# Patient Record
Sex: Female | Born: 1970 | Race: White | Hispanic: No | Smoking: Current every day smoker
Health system: Southern US, Community
[De-identification: ages and names within clinical notes are randomized; demographics above are authoritative.]

## PROBLEM LIST (undated history)

## (undated) DIAGNOSIS — J449 Chronic obstructive pulmonary disease, unspecified: Secondary | ICD-10-CM

## (undated) DIAGNOSIS — Z87442 Personal history of urinary calculi: Secondary | ICD-10-CM

## (undated) DIAGNOSIS — G8929 Other chronic pain: Secondary | ICD-10-CM

## (undated) DIAGNOSIS — I368 Other nonrheumatic tricuspid valve disorders: Secondary | ICD-10-CM

## (undated) DIAGNOSIS — F419 Anxiety disorder, unspecified: Secondary | ICD-10-CM

## (undated) DIAGNOSIS — N809 Endometriosis, unspecified: Secondary | ICD-10-CM

## (undated) DIAGNOSIS — M25569 Pain in unspecified knee: Secondary | ICD-10-CM

## (undated) DIAGNOSIS — F1910 Other psychoactive substance abuse, uncomplicated: Secondary | ICD-10-CM

## (undated) DIAGNOSIS — B1920 Unspecified viral hepatitis C without hepatic coma: Secondary | ICD-10-CM

## (undated) DIAGNOSIS — IMO0002 Reserved for concepts with insufficient information to code with codable children: Secondary | ICD-10-CM

## (undated) DIAGNOSIS — M009 Pyogenic arthritis, unspecified: Secondary | ICD-10-CM

## (undated) DIAGNOSIS — I2699 Other pulmonary embolism without acute cor pulmonale: Secondary | ICD-10-CM

## (undated) DIAGNOSIS — K219 Gastro-esophageal reflux disease without esophagitis: Secondary | ICD-10-CM

## (undated) DIAGNOSIS — M549 Dorsalgia, unspecified: Secondary | ICD-10-CM

## (undated) DIAGNOSIS — N200 Calculus of kidney: Secondary | ICD-10-CM

## (undated) DIAGNOSIS — T1490XA Injury, unspecified, initial encounter: Secondary | ICD-10-CM

## (undated) DIAGNOSIS — F410 Panic disorder [episodic paroxysmal anxiety] without agoraphobia: Secondary | ICD-10-CM

## (undated) HISTORY — PX: NOSE SURGERY: SHX723

## (undated) HISTORY — DX: Chronic obstructive pulmonary disease, unspecified: J44.9

## (undated) HISTORY — DX: Anxiety disorder, unspecified: F41.9

## (undated) HISTORY — PX: TUBAL LIGATION: SHX77

## (undated) HISTORY — DX: Pyogenic arthritis, unspecified: M00.9

## (undated) HISTORY — DX: Endometriosis, unspecified: N80.9

## (undated) HISTORY — DX: Injury, unspecified, initial encounter: T14.90XA

## (undated) HISTORY — DX: Panic disorder (episodic paroxysmal anxiety): F41.0

## (undated) HISTORY — DX: Other nonrheumatic tricuspid valve disorders: I36.8

---

## 1997-05-23 ENCOUNTER — Inpatient Hospital Stay (HOSPITAL_COMMUNITY): Admission: AD | Admit: 1997-05-23 | Discharge: 1997-05-25 | Payer: Self-pay | Admitting: Family Medicine

## 1997-05-27 ENCOUNTER — Inpatient Hospital Stay (HOSPITAL_COMMUNITY): Admission: AD | Admit: 1997-05-27 | Discharge: 1997-05-30 | Payer: Self-pay | Admitting: Family Medicine

## 2000-11-04 ENCOUNTER — Emergency Department (HOSPITAL_COMMUNITY): Admission: EM | Admit: 2000-11-04 | Discharge: 2000-11-04 | Payer: Self-pay | Admitting: *Deleted

## 2000-11-04 ENCOUNTER — Emergency Department (HOSPITAL_COMMUNITY): Admission: EM | Admit: 2000-11-04 | Discharge: 2000-11-04 | Payer: Self-pay | Admitting: Emergency Medicine

## 2000-11-14 ENCOUNTER — Emergency Department (HOSPITAL_COMMUNITY): Admission: EM | Admit: 2000-11-14 | Discharge: 2000-11-14 | Payer: Self-pay | Admitting: Emergency Medicine

## 2001-02-28 ENCOUNTER — Encounter: Payer: Self-pay | Admitting: Emergency Medicine

## 2001-02-28 ENCOUNTER — Emergency Department (HOSPITAL_COMMUNITY): Admission: EM | Admit: 2001-02-28 | Discharge: 2001-02-28 | Payer: Self-pay | Admitting: Emergency Medicine

## 2001-09-05 ENCOUNTER — Other Ambulatory Visit: Admission: RE | Admit: 2001-09-05 | Discharge: 2001-09-05 | Payer: Self-pay

## 2001-11-20 ENCOUNTER — Emergency Department (HOSPITAL_COMMUNITY): Admission: EM | Admit: 2001-11-20 | Discharge: 2001-11-20 | Payer: Self-pay | Admitting: Emergency Medicine

## 2003-12-21 ENCOUNTER — Emergency Department (HOSPITAL_COMMUNITY): Admission: EM | Admit: 2003-12-21 | Discharge: 2003-12-22 | Payer: Self-pay | Admitting: *Deleted

## 2005-08-05 ENCOUNTER — Emergency Department (HOSPITAL_COMMUNITY): Admission: EM | Admit: 2005-08-05 | Discharge: 2005-08-05 | Payer: Self-pay | Admitting: Family Medicine

## 2007-06-06 ENCOUNTER — Emergency Department (HOSPITAL_COMMUNITY): Admission: EM | Admit: 2007-06-06 | Discharge: 2007-06-06 | Payer: Self-pay | Admitting: Emergency Medicine

## 2007-07-17 ENCOUNTER — Emergency Department (HOSPITAL_COMMUNITY): Admission: EM | Admit: 2007-07-17 | Discharge: 2007-07-17 | Payer: Self-pay | Admitting: Emergency Medicine

## 2007-10-27 ENCOUNTER — Emergency Department (HOSPITAL_COMMUNITY): Admission: EM | Admit: 2007-10-27 | Discharge: 2007-10-27 | Payer: Self-pay | Admitting: *Deleted

## 2008-06-06 ENCOUNTER — Emergency Department (HOSPITAL_COMMUNITY): Admission: EM | Admit: 2008-06-06 | Discharge: 2008-06-06 | Payer: Self-pay | Admitting: Emergency Medicine

## 2008-08-12 ENCOUNTER — Emergency Department (HOSPITAL_COMMUNITY): Admission: EM | Admit: 2008-08-12 | Discharge: 2008-08-12 | Payer: Self-pay | Admitting: Emergency Medicine

## 2008-12-08 ENCOUNTER — Emergency Department (HOSPITAL_COMMUNITY): Admission: EM | Admit: 2008-12-08 | Discharge: 2008-12-08 | Payer: Self-pay | Admitting: Emergency Medicine

## 2008-12-10 ENCOUNTER — Emergency Department (HOSPITAL_COMMUNITY): Admission: EM | Admit: 2008-12-10 | Discharge: 2008-12-10 | Payer: Self-pay | Admitting: Emergency Medicine

## 2009-01-18 ENCOUNTER — Emergency Department (HOSPITAL_COMMUNITY): Admission: EM | Admit: 2009-01-18 | Discharge: 2009-01-18 | Payer: Self-pay | Admitting: Emergency Medicine

## 2009-05-17 ENCOUNTER — Emergency Department (HOSPITAL_COMMUNITY): Admission: EM | Admit: 2009-05-17 | Discharge: 2009-05-17 | Payer: Self-pay | Admitting: Emergency Medicine

## 2009-10-21 ENCOUNTER — Emergency Department (HOSPITAL_COMMUNITY): Admission: EM | Admit: 2009-10-21 | Discharge: 2009-10-21 | Payer: Self-pay | Admitting: Emergency Medicine

## 2009-11-13 ENCOUNTER — Emergency Department (HOSPITAL_COMMUNITY): Admission: EM | Admit: 2009-11-13 | Discharge: 2009-11-13 | Payer: Self-pay | Admitting: Emergency Medicine

## 2009-12-23 ENCOUNTER — Emergency Department (HOSPITAL_COMMUNITY): Admission: EM | Admit: 2009-12-23 | Discharge: 2009-12-23 | Payer: Self-pay | Admitting: Emergency Medicine

## 2009-12-26 ENCOUNTER — Emergency Department (HOSPITAL_COMMUNITY): Admission: EM | Admit: 2009-12-26 | Discharge: 2009-12-26 | Payer: Self-pay | Admitting: Emergency Medicine

## 2010-03-02 ENCOUNTER — Other Ambulatory Visit: Admission: RE | Admit: 2010-03-02 | Discharge: 2010-03-02 | Payer: Self-pay | Admitting: Obstetrics & Gynecology

## 2010-03-31 ENCOUNTER — Emergency Department (HOSPITAL_COMMUNITY)
Admission: EM | Admit: 2010-03-31 | Discharge: 2010-03-31 | Payer: Self-pay | Source: Home / Self Care | Admitting: Emergency Medicine

## 2010-06-16 ENCOUNTER — Emergency Department (HOSPITAL_COMMUNITY): Payer: Medicare Other

## 2010-06-16 ENCOUNTER — Emergency Department (HOSPITAL_COMMUNITY)
Admission: EM | Admit: 2010-06-16 | Discharge: 2010-06-17 | Disposition: A | Discharge status: Home / Self Care | Payer: Medicare Other | Attending: Emergency Medicine | Admitting: Emergency Medicine

## 2010-06-16 DIAGNOSIS — J209 Acute bronchitis, unspecified: Secondary | ICD-10-CM | POA: Insufficient documentation

## 2010-06-16 DIAGNOSIS — R509 Fever, unspecified: Secondary | ICD-10-CM | POA: Insufficient documentation

## 2010-06-16 DIAGNOSIS — R059 Cough, unspecified: Secondary | ICD-10-CM | POA: Insufficient documentation

## 2010-06-16 DIAGNOSIS — R05 Cough: Secondary | ICD-10-CM | POA: Insufficient documentation

## 2010-06-16 DIAGNOSIS — M549 Dorsalgia, unspecified: Secondary | ICD-10-CM | POA: Insufficient documentation

## 2010-06-25 LAB — WET PREP, GENITAL
Trich, Wet Prep: NONE SEEN
Yeast Wet Prep HPF POC: NONE SEEN

## 2010-06-25 LAB — RPR: RPR Ser Ql: NONREACTIVE

## 2010-06-25 LAB — URINALYSIS, ROUTINE W REFLEX MICROSCOPIC
Bilirubin Urine: NEGATIVE
Bilirubin Urine: NEGATIVE
Glucose, UA: NEGATIVE mg/dL
Glucose, UA: NEGATIVE mg/dL
Hgb urine dipstick: NEGATIVE
Hgb urine dipstick: NEGATIVE
Ketones, ur: NEGATIVE mg/dL
Ketones, ur: NEGATIVE mg/dL
Nitrite: NEGATIVE
Nitrite: NEGATIVE
Protein, ur: NEGATIVE mg/dL
Protein, ur: NEGATIVE mg/dL
Specific Gravity, Urine: 1.025 (ref 1.005–1.030)
Specific Gravity, Urine: 1.02 (ref 1.005–1.030)
Urobilinogen, UA: 0.2 mg/dL (ref 0.0–1.0)
Urobilinogen, UA: 0.2 mg/dL (ref 0.0–1.0)
pH: 6 (ref 5.0–8.0)
pH: 5.5 (ref 5.0–8.0)

## 2010-06-25 LAB — POCT PREGNANCY, URINE: Preg Test, Ur: NEGATIVE

## 2010-06-25 LAB — URINE MICROSCOPIC-ADD ON

## 2010-06-25 LAB — GC/CHLAMYDIA PROBE AMP, GENITAL
Chlamydia, DNA Probe: NEGATIVE
GC Probe Amp, Genital: NEGATIVE

## 2010-06-26 LAB — RAPID URINE DRUG SCREEN, HOSP PERFORMED
Amphetamines: NOT DETECTED
Barbiturates: NOT DETECTED
Benzodiazepines: POSITIVE — AB
Cocaine: POSITIVE — AB
Opiates: NOT DETECTED
Tetrahydrocannabinol: NOT DETECTED

## 2010-06-26 LAB — GLUCOSE, CAPILLARY: Glucose-Capillary: 107 mg/dL — ABNORMAL HIGH (ref 70–99)

## 2010-06-28 LAB — URINALYSIS, ROUTINE W REFLEX MICROSCOPIC
Bilirubin Urine: NEGATIVE
Glucose, UA: NEGATIVE mg/dL
Hgb urine dipstick: NEGATIVE
Ketones, ur: NEGATIVE mg/dL
Nitrite: NEGATIVE
Protein, ur: NEGATIVE mg/dL
Specific Gravity, Urine: 1.03 (ref 1.005–1.030)
Urobilinogen, UA: 0.2 mg/dL (ref 0.0–1.0)
pH: 5.5 (ref 5.0–8.0)

## 2010-06-28 LAB — DIFFERENTIAL
Basophils Absolute: 0 10*3/uL (ref 0.0–0.1)
Basophils Relative: 0 % (ref 0–1)
Eosinophils Absolute: 0.2 10*3/uL (ref 0.0–0.7)
Eosinophils Relative: 2 % (ref 0–5)
Lymphocytes Relative: 29 % (ref 12–46)
Lymphs Abs: 2.4 10*3/uL (ref 0.7–4.0)
Monocytes Absolute: 0.6 10*3/uL (ref 0.1–1.0)
Monocytes Relative: 7 % (ref 3–12)
Neutro Abs: 5 10*3/uL (ref 1.7–7.7)
Neutrophils Relative %: 61 % (ref 43–77)

## 2010-06-28 LAB — BASIC METABOLIC PANEL
BUN: 13 mg/dL (ref 6–23)
CO2: 20 mEq/L (ref 19–32)
Calcium: 8.8 mg/dL (ref 8.4–10.5)
Chloride: 106 mEq/L (ref 96–112)
Creatinine, Ser: 0.84 mg/dL (ref 0.4–1.2)
GFR calc Af Amer: >60 mL/min (ref >60)
GFR calc non Af Amer: >60 mL/min (ref >60)
Glucose, Bld: 119 mg/dL — ABNORMAL HIGH (ref 70–99)
Potassium: 3.5 mEq/L (ref 3.5–5.1)
Sodium: 134 mEq/L — ABNORMAL LOW (ref 135–145)

## 2010-06-28 LAB — WET PREP, GENITAL
Trich, Wet Prep: NONE SEEN
WBC, Wet Prep HPF POC: NONE SEEN
Yeast Wet Prep HPF POC: NONE SEEN

## 2010-06-28 LAB — CBC
HCT: 37.7 % (ref 36.0–46.0)
Hemoglobin: 13.1 g/dL (ref 12.0–15.0)
MCH: 30.8 pg (ref 26.0–34.0)
MCHC: 34.9 g/dL (ref 30.0–36.0)
MCV: 88.3 fL (ref 78.0–100.0)
Platelets: 260 10*3/uL (ref 150–400)
RBC: 4.27 MIL/uL (ref 3.87–5.11)
RDW: 14.6 % (ref 11.5–15.5)
WBC: 8.3 10*3/uL (ref 4.0–10.5)

## 2010-06-28 LAB — GC/CHLAMYDIA PROBE AMP, GENITAL
Chlamydia, DNA Probe: NEGATIVE
GC Probe Amp, Genital: NEGATIVE

## 2010-06-28 LAB — POCT PREGNANCY, URINE: Preg Test, Ur: NEGATIVE

## 2010-07-08 ENCOUNTER — Emergency Department (HOSPITAL_COMMUNITY)
Admission: EM | Admit: 2010-07-08 | Discharge: 2010-07-08 | Disposition: A | Discharge status: Home / Self Care | Payer: Medicare Other | Attending: Emergency Medicine | Admitting: Emergency Medicine

## 2010-07-08 ENCOUNTER — Emergency Department (HOSPITAL_COMMUNITY): Payer: Medicare Other

## 2010-07-08 DIAGNOSIS — S9780XA Crushing injury of unspecified foot, initial encounter: Secondary | ICD-10-CM | POA: Insufficient documentation

## 2010-07-08 DIAGNOSIS — S92253A Displaced fracture of navicular [scaphoid] of unspecified foot, initial encounter for closed fracture: Secondary | ICD-10-CM | POA: Insufficient documentation

## 2010-07-08 DIAGNOSIS — Y92009 Unspecified place in unspecified non-institutional (private) residence as the place of occurrence of the external cause: Secondary | ICD-10-CM | POA: Insufficient documentation

## 2010-07-13 ENCOUNTER — Emergency Department (HOSPITAL_COMMUNITY)
Admission: EM | Admit: 2010-07-13 | Discharge: 2010-07-13 | Disposition: A | Discharge status: Home / Self Care | Payer: Medicare Other | Attending: Emergency Medicine | Admitting: Emergency Medicine

## 2010-07-13 DIAGNOSIS — F319 Bipolar disorder, unspecified: Secondary | ICD-10-CM | POA: Insufficient documentation

## 2010-07-13 DIAGNOSIS — Z79899 Other long term (current) drug therapy: Secondary | ICD-10-CM | POA: Insufficient documentation

## 2010-07-13 DIAGNOSIS — M79609 Pain in unspecified limb: Secondary | ICD-10-CM | POA: Insufficient documentation

## 2010-07-13 DIAGNOSIS — Z4789 Encounter for other orthopedic aftercare: Secondary | ICD-10-CM | POA: Insufficient documentation

## 2010-07-16 LAB — DIFFERENTIAL
Basophils Absolute: 0 10*3/uL (ref 0.0–0.1)
Basophils Relative: 0 % (ref 0–1)
Eosinophils Absolute: 0.2 10*3/uL (ref 0.0–0.7)
Eosinophils Relative: 2 % (ref 0–5)
Lymphocytes Relative: 25 % (ref 12–46)
Lymphs Abs: 1.8 10*3/uL (ref 0.7–4.0)
Monocytes Absolute: 0.5 10*3/uL (ref 0.1–1.0)
Monocytes Relative: 7 % (ref 3–12)
Neutro Abs: 4.8 10*3/uL (ref 1.7–7.7)
Neutrophils Relative %: 66 % (ref 43–77)

## 2010-07-16 LAB — URINALYSIS, ROUTINE W REFLEX MICROSCOPIC
Bilirubin Urine: NEGATIVE
Glucose, UA: NEGATIVE mg/dL
Hgb urine dipstick: NEGATIVE
Ketones, ur: NEGATIVE mg/dL
Nitrite: NEGATIVE
Protein, ur: NEGATIVE mg/dL
Specific Gravity, Urine: 1.01 (ref 1.005–1.030)
Urobilinogen, UA: 0.2 mg/dL (ref 0.0–1.0)
pH: 8 (ref 5.0–8.0)

## 2010-07-16 LAB — RAPID URINE DRUG SCREEN, HOSP PERFORMED
Amphetamines: NOT DETECTED
Barbiturates: NOT DETECTED
Benzodiazepines: POSITIVE — AB
Cocaine: NOT DETECTED
Opiates: POSITIVE — AB
Tetrahydrocannabinol: NOT DETECTED

## 2010-07-16 LAB — COMPREHENSIVE METABOLIC PANEL
ALT: 37 U/L — ABNORMAL HIGH (ref 0–35)
AST: 37 U/L (ref 0–37)
Albumin: 3 g/dL — ABNORMAL LOW (ref 3.5–5.2)
Alkaline Phosphatase: 50 U/L (ref 39–117)
BUN: 9 mg/dL (ref 6–23)
CO2: 29 mEq/L (ref 19–32)
Calcium: 8.4 mg/dL (ref 8.4–10.5)
Chloride: 106 mEq/L (ref 96–112)
Creatinine, Ser: 0.64 mg/dL (ref 0.4–1.2)
GFR calc Af Amer: >60 mL/min (ref >60)
GFR calc non Af Amer: >60 mL/min (ref >60)
Glucose, Bld: 113 mg/dL — ABNORMAL HIGH (ref 70–99)
Potassium: 3.5 mEq/L (ref 3.5–5.1)
Sodium: 138 mEq/L (ref 135–145)
Total Bilirubin: 0.2 mg/dL — ABNORMAL LOW (ref 0.3–1.2)
Total Protein: 6.2 g/dL (ref 6.0–8.3)

## 2010-07-16 LAB — CBC
HCT: 33.5 % — ABNORMAL LOW (ref 36.0–46.0)
Hemoglobin: 11.6 g/dL — ABNORMAL LOW (ref 12.0–15.0)
MCHC: 34.8 g/dL (ref 30.0–36.0)
MCV: 90.4 fL (ref 78.0–100.0)
Platelets: 213 10*3/uL (ref 150–400)
RBC: 3.71 MIL/uL — ABNORMAL LOW (ref 3.87–5.11)
RDW: 14.9 % (ref 11.5–15.5)
WBC: 7.4 10*3/uL (ref 4.0–10.5)

## 2010-07-16 LAB — ETHANOL: Alcohol, Ethyl (B): <5 mg/dL (ref 0–10)

## 2010-07-21 ENCOUNTER — Encounter: Payer: Self-pay | Admitting: Orthopedic Surgery

## 2010-07-21 ENCOUNTER — Ambulatory Visit (INDEPENDENT_AMBULATORY_CARE_PROVIDER_SITE_OTHER): Payer: Medicare Other | Admitting: Orthopedic Surgery

## 2010-07-21 VITALS — HR 80 | Resp 16 | Ht 61.5 in | Wt 240.0 lb

## 2010-07-21 DIAGNOSIS — S9030XA Contusion of unspecified foot, initial encounter: Secondary | ICD-10-CM

## 2010-07-21 MED ORDER — OXYCODONE-ACETAMINOPHEN 5-325 MG PO TABS: 1.0000 | ORAL_TABLET | ORAL | Status: DC | PRN

## 2010-07-21 NOTE — Patient Instructions (Signed)
Use Walker when walking
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
## 2010-07-28 LAB — DIFFERENTIAL
Basophils Absolute: 0 10*3/uL (ref 0.0–0.1)
Basophils Relative: 0 % (ref 0–1)
Eosinophils Absolute: 0.1 10*3/uL (ref 0.0–0.7)
Eosinophils Relative: 1 % (ref 0–5)
Lymphocytes Relative: 30 % (ref 12–46)
Lymphs Abs: 2.9 10*3/uL (ref 0.7–4.0)
Monocytes Absolute: 0.7 10*3/uL (ref 0.1–1.0)
Monocytes Relative: 7 % (ref 3–12)
Neutro Abs: 5.7 10*3/uL (ref 1.7–7.7)
Neutrophils Relative %: 61 % (ref 43–77)

## 2010-07-28 LAB — URINALYSIS, ROUTINE W REFLEX MICROSCOPIC
Bilirubin Urine: NEGATIVE
Glucose, UA: NEGATIVE mg/dL
Hgb urine dipstick: NEGATIVE
Ketones, ur: NEGATIVE mg/dL
Nitrite: NEGATIVE
Protein, ur: NEGATIVE mg/dL
Specific Gravity, Urine: 1.025 (ref 1.005–1.030)
Urobilinogen, UA: 0.2 mg/dL (ref 0.0–1.0)
pH: 6 (ref 5.0–8.0)

## 2010-07-28 LAB — CBC
HCT: 38.7 % (ref 36.0–46.0)
Hemoglobin: 13.5 g/dL (ref 12.0–15.0)
MCHC: 34.9 g/dL (ref 30.0–36.0)
MCV: 89.9 fL (ref 78.0–100.0)
Platelets: 246 10*3/uL (ref 150–400)
RBC: 4.3 MIL/uL (ref 3.87–5.11)
RDW: 14.9 % (ref 11.5–15.5)
WBC: 9.4 10*3/uL (ref 4.0–10.5)

## 2010-07-28 LAB — D-DIMER, QUANTITATIVE (NOT AT ARMC): D-Dimer, Quant: 0.26 ug/mL-FEU (ref 0.00–0.48)

## 2010-07-28 LAB — PREGNANCY, URINE: Preg Test, Ur: NEGATIVE

## 2010-09-01 ENCOUNTER — Ambulatory Visit (INDEPENDENT_AMBULATORY_CARE_PROVIDER_SITE_OTHER): Payer: Medicare Other | Admitting: Orthopedic Surgery

## 2010-09-01 DIAGNOSIS — S8990XA Unspecified injury of unspecified lower leg, initial encounter: Secondary | ICD-10-CM

## 2010-09-01 DIAGNOSIS — S99929A Unspecified injury of unspecified foot, initial encounter: Secondary | ICD-10-CM

## 2010-09-01 MED ORDER — HYDROCODONE-ACETAMINOPHEN 5-325 MG PO TABS: 1.0000 | ORAL_TABLET | Freq: Four times a day (QID) | ORAL | Status: AC | PRN
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
## 2010-09-17 ENCOUNTER — Emergency Department (HOSPITAL_COMMUNITY)
Admission: EM | Admit: 2010-09-17 | Discharge: 2010-09-17 | Disposition: A | Discharge status: Home / Self Care | Payer: Medicare Other | Attending: Emergency Medicine | Admitting: Emergency Medicine

## 2010-09-17 DIAGNOSIS — J4489 Other specified chronic obstructive pulmonary disease: Secondary | ICD-10-CM | POA: Insufficient documentation

## 2010-09-17 DIAGNOSIS — F17200 Nicotine dependence, unspecified, uncomplicated: Secondary | ICD-10-CM | POA: Insufficient documentation

## 2010-09-17 DIAGNOSIS — M545 Low back pain, unspecified: Secondary | ICD-10-CM | POA: Insufficient documentation

## 2010-09-17 DIAGNOSIS — G8929 Other chronic pain: Secondary | ICD-10-CM | POA: Insufficient documentation

## 2010-09-17 DIAGNOSIS — J449 Chronic obstructive pulmonary disease, unspecified: Secondary | ICD-10-CM | POA: Insufficient documentation

## 2010-10-01 ENCOUNTER — Emergency Department (HOSPITAL_COMMUNITY)
Admission: EM | Admit: 2010-10-01 | Discharge: 2010-10-01 | Disposition: A | Discharge status: Home / Self Care | Payer: Medicare Other | Attending: Emergency Medicine | Admitting: Emergency Medicine

## 2010-10-01 ENCOUNTER — Emergency Department (HOSPITAL_COMMUNITY): Payer: Medicare Other

## 2010-10-01 DIAGNOSIS — F319 Bipolar disorder, unspecified: Secondary | ICD-10-CM | POA: Insufficient documentation

## 2010-10-01 DIAGNOSIS — F411 Generalized anxiety disorder: Secondary | ICD-10-CM | POA: Insufficient documentation

## 2010-10-01 DIAGNOSIS — R059 Cough, unspecified: Secondary | ICD-10-CM | POA: Insufficient documentation

## 2010-10-01 DIAGNOSIS — J449 Chronic obstructive pulmonary disease, unspecified: Secondary | ICD-10-CM | POA: Insufficient documentation

## 2010-10-01 DIAGNOSIS — F209 Schizophrenia, unspecified: Secondary | ICD-10-CM | POA: Insufficient documentation

## 2010-10-01 DIAGNOSIS — F909 Attention-deficit hyperactivity disorder, unspecified type: Secondary | ICD-10-CM | POA: Insufficient documentation

## 2010-10-01 DIAGNOSIS — R062 Wheezing: Secondary | ICD-10-CM | POA: Insufficient documentation

## 2010-10-01 DIAGNOSIS — J4489 Other specified chronic obstructive pulmonary disease: Secondary | ICD-10-CM | POA: Insufficient documentation

## 2010-10-01 DIAGNOSIS — M549 Dorsalgia, unspecified: Secondary | ICD-10-CM | POA: Insufficient documentation

## 2010-10-01 DIAGNOSIS — R05 Cough: Secondary | ICD-10-CM | POA: Insufficient documentation

## 2010-10-01 DIAGNOSIS — F17200 Nicotine dependence, unspecified, uncomplicated: Secondary | ICD-10-CM | POA: Insufficient documentation

## 2010-10-01 LAB — RAPID STREP SCREEN (MED CTR MEBANE ONLY): Streptococcus, Group A Screen (Direct): NEGATIVE

## 2010-10-09 ENCOUNTER — Emergency Department (HOSPITAL_COMMUNITY): Payer: Medicare Other

## 2010-10-09 ENCOUNTER — Emergency Department (HOSPITAL_COMMUNITY)
Admission: EM | Admit: 2010-10-09 | Discharge: 2010-10-10 | Disposition: A | Discharge status: Home / Self Care | Payer: Medicare Other | Attending: Emergency Medicine | Admitting: Emergency Medicine

## 2010-10-09 DIAGNOSIS — F411 Generalized anxiety disorder: Secondary | ICD-10-CM | POA: Insufficient documentation

## 2010-10-09 DIAGNOSIS — F319 Bipolar disorder, unspecified: Secondary | ICD-10-CM | POA: Insufficient documentation

## 2010-10-09 DIAGNOSIS — R112 Nausea with vomiting, unspecified: Secondary | ICD-10-CM | POA: Insufficient documentation

## 2010-10-09 DIAGNOSIS — R079 Chest pain, unspecified: Secondary | ICD-10-CM | POA: Insufficient documentation

## 2010-10-09 DIAGNOSIS — R0609 Other forms of dyspnea: Secondary | ICD-10-CM | POA: Insufficient documentation

## 2010-10-09 DIAGNOSIS — R109 Unspecified abdominal pain: Secondary | ICD-10-CM | POA: Insufficient documentation

## 2010-10-09 DIAGNOSIS — R197 Diarrhea, unspecified: Secondary | ICD-10-CM | POA: Insufficient documentation

## 2010-10-09 DIAGNOSIS — R0989 Other specified symptoms and signs involving the circulatory and respiratory systems: Secondary | ICD-10-CM | POA: Insufficient documentation

## 2010-10-09 DIAGNOSIS — Z79899 Other long term (current) drug therapy: Secondary | ICD-10-CM | POA: Insufficient documentation

## 2010-10-09 DIAGNOSIS — R05 Cough: Secondary | ICD-10-CM | POA: Insufficient documentation

## 2010-10-09 DIAGNOSIS — R059 Cough, unspecified: Secondary | ICD-10-CM | POA: Insufficient documentation

## 2010-10-09 LAB — DIFFERENTIAL
Basophils Absolute: 0.1 10*3/uL (ref 0.0–0.1)
Basophils Relative: 1 % (ref 0–1)
Eosinophils Absolute: 0.2 10*3/uL (ref 0.0–0.7)
Eosinophils Relative: 1 % (ref 0–5)
Lymphocytes Relative: 40 % (ref 12–46)
Lymphs Abs: 4.7 10*3/uL — ABNORMAL HIGH (ref 0.7–4.0)
Monocytes Absolute: 0.6 10*3/uL (ref 0.1–1.0)
Monocytes Relative: 5 % (ref 3–12)
Neutro Abs: 6.3 10*3/uL (ref 1.7–7.7)
Neutrophils Relative %: 53 % (ref 43–77)

## 2010-10-09 LAB — CK TOTAL AND CKMB (NOT AT ARMC)
CK, MB: 2.1 ng/mL (ref 0.3–4.0)
Relative Index: 1.8 (ref 0.0–2.5)
Total CK: 119 U/L (ref 7–177)

## 2010-10-09 LAB — CBC
HCT: 38.3 % (ref 36.0–46.0)
Hemoglobin: 12.9 g/dL (ref 12.0–15.0)
MCH: 29.3 pg (ref 26.0–34.0)
MCHC: 33.7 g/dL (ref 30.0–36.0)
MCV: 87 fL (ref 78.0–100.0)
Platelets: 313 10*3/uL (ref 150–400)
RBC: 4.4 MIL/uL (ref 3.87–5.11)
RDW: 14.6 % (ref 11.5–15.5)
WBC: 11.9 10*3/uL — ABNORMAL HIGH (ref 4.0–10.5)

## 2010-10-09 LAB — COMPREHENSIVE METABOLIC PANEL
ALT: 13 U/L (ref 0–35)
AST: 12 U/L (ref 0–37)
Albumin: 3.3 g/dL — ABNORMAL LOW (ref 3.5–5.2)
Alkaline Phosphatase: 66 U/L (ref 39–117)
BUN: 12 mg/dL (ref 6–23)
CO2: 24 mEq/L (ref 19–32)
Calcium: 8.9 mg/dL (ref 8.4–10.5)
Chloride: 103 mEq/L (ref 96–112)
Creatinine, Ser: 0.84 mg/dL (ref 0.50–1.10)
GFR calc Af Amer: >60 mL/min (ref >60)
GFR calc non Af Amer: >60 mL/min (ref >60)
Glucose, Bld: 151 mg/dL — ABNORMAL HIGH (ref 70–99)
Potassium: 3.4 mEq/L — ABNORMAL LOW (ref 3.5–5.1)
Sodium: 137 mEq/L (ref 135–145)
Total Bilirubin: 0.1 mg/dL — ABNORMAL LOW (ref 0.3–1.2)
Total Protein: 7 g/dL (ref 6.0–8.3)

## 2010-10-09 LAB — TROPONIN I: Troponin I: <0.3 ng/mL (ref <0.30)

## 2010-10-09 LAB — LIPASE, BLOOD: Lipase: 42 U/L (ref 11–59)

## 2011-01-01 LAB — WET PREP, GENITAL: Yeast Wet Prep HPF POC: NONE SEEN

## 2011-01-01 LAB — URINALYSIS, ROUTINE W REFLEX MICROSCOPIC
Bilirubin Urine: NEGATIVE
Glucose, UA: NEGATIVE
Hgb urine dipstick: NEGATIVE
Ketones, ur: NEGATIVE
Nitrite: NEGATIVE
Protein, ur: NEGATIVE
Specific Gravity, Urine: 1.025
Urobilinogen, UA: 0.2
pH: 6.5

## 2011-01-01 LAB — GC/CHLAMYDIA PROBE AMP, GENITAL
Chlamydia, DNA Probe: NEGATIVE
GC Probe Amp, Genital: NEGATIVE

## 2011-01-01 LAB — PREGNANCY, URINE: Preg Test, Ur: NEGATIVE

## 2011-03-28 ENCOUNTER — Encounter (HOSPITAL_COMMUNITY): Payer: Self-pay

## 2011-03-28 ENCOUNTER — Emergency Department (HOSPITAL_COMMUNITY)
Admission: EM | Admit: 2011-03-28 | Discharge: 2011-03-28 | Disposition: A | Discharge status: Home / Self Care | Payer: Medicare Other | Attending: Emergency Medicine | Admitting: Emergency Medicine

## 2011-03-28 DIAGNOSIS — K648 Other hemorrhoids: Secondary | ICD-10-CM | POA: Insufficient documentation

## 2011-03-28 DIAGNOSIS — K5900 Constipation, unspecified: Secondary | ICD-10-CM | POA: Insufficient documentation

## 2011-03-28 DIAGNOSIS — K644 Residual hemorrhoidal skin tags: Secondary | ICD-10-CM | POA: Insufficient documentation

## 2011-03-28 DIAGNOSIS — J449 Chronic obstructive pulmonary disease, unspecified: Secondary | ICD-10-CM | POA: Insufficient documentation

## 2011-03-28 DIAGNOSIS — J4489 Other specified chronic obstructive pulmonary disease: Secondary | ICD-10-CM | POA: Insufficient documentation

## 2011-03-28 DIAGNOSIS — K649 Unspecified hemorrhoids: Secondary | ICD-10-CM

## 2011-03-28 DIAGNOSIS — F411 Generalized anxiety disorder: Secondary | ICD-10-CM | POA: Insufficient documentation

## 2011-03-28 DIAGNOSIS — F17200 Nicotine dependence, unspecified, uncomplicated: Secondary | ICD-10-CM | POA: Insufficient documentation
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
## 2011-03-28 NOTE — ED Notes (Signed)
Pt c/o of experiencing hemorrhoids for the past 2 weeks. Pt states that the "hemorrhoids are hanging out." pt reports pain anytime that she tries to have a bowel movement as well as noticing some blood when she wipes. Pt reports having some constipation 3 days ago in which she administered her own mineral oil enema. Pt states that she did pass some stool with this. Pt reports pain in her rectum 10/10.

## 2011-03-28 NOTE — ED Notes (Signed)
Pt up to bathroom at this time

## 2011-03-28 NOTE — ED Notes (Signed)
Pt reports severe pain from her hemorrhoids.  Pt reports "they are hanging out".  Pt reports inability to use the bathroom b/c of the pain.

## 2011-03-28 NOTE — ED Notes (Signed)
This RN assisted with EDP in rectal exam. Pt tolerated well however angry after exam after being told by EDP that narcotics would not help her hemorrhoids. Pt states this is "bull shit." Pt states not informed that EDP would be performing rectal exam. EDP and RN made patient aware of pending rectal exam and what to expect. EDP and RN at the bedside at this time.

## 2011-04-06 ENCOUNTER — Emergency Department (HOSPITAL_COMMUNITY)
Admission: EM | Admit: 2011-04-06 | Discharge: 2011-04-06 | Disposition: A | Discharge status: Home / Self Care | Payer: Medicare Other | Attending: Emergency Medicine | Admitting: Emergency Medicine

## 2011-04-06 ENCOUNTER — Encounter (HOSPITAL_COMMUNITY): Payer: Self-pay | Admitting: Emergency Medicine

## 2011-04-06 ENCOUNTER — Emergency Department (HOSPITAL_COMMUNITY): Payer: Medicare Other

## 2011-04-06 DIAGNOSIS — F411 Generalized anxiety disorder: Secondary | ICD-10-CM | POA: Insufficient documentation

## 2011-04-06 DIAGNOSIS — F17200 Nicotine dependence, unspecified, uncomplicated: Secondary | ICD-10-CM | POA: Insufficient documentation

## 2011-04-06 DIAGNOSIS — IMO0001 Reserved for inherently not codable concepts without codable children: Secondary | ICD-10-CM | POA: Insufficient documentation

## 2011-04-06 DIAGNOSIS — J329 Chronic sinusitis, unspecified: Secondary | ICD-10-CM | POA: Insufficient documentation

## 2011-04-06 DIAGNOSIS — R220 Localized swelling, mass and lump, head: Secondary | ICD-10-CM | POA: Insufficient documentation

## 2011-04-06 DIAGNOSIS — R042 Hemoptysis: Secondary | ICD-10-CM | POA: Insufficient documentation

## 2011-04-06 DIAGNOSIS — R51 Headache: Secondary | ICD-10-CM | POA: Insufficient documentation

## 2011-04-06 DIAGNOSIS — J449 Chronic obstructive pulmonary disease, unspecified: Secondary | ICD-10-CM | POA: Insufficient documentation

## 2011-04-06 DIAGNOSIS — J3489 Other specified disorders of nose and nasal sinuses: Secondary | ICD-10-CM | POA: Insufficient documentation

## 2011-04-06 DIAGNOSIS — K089 Disorder of teeth and supporting structures, unspecified: Secondary | ICD-10-CM | POA: Insufficient documentation

## 2011-04-06 DIAGNOSIS — Z79899 Other long term (current) drug therapy: Secondary | ICD-10-CM | POA: Insufficient documentation

## 2011-04-06 DIAGNOSIS — J4489 Other specified chronic obstructive pulmonary disease: Secondary | ICD-10-CM | POA: Insufficient documentation

## 2011-04-06 DIAGNOSIS — R059 Cough, unspecified: Secondary | ICD-10-CM | POA: Insufficient documentation

## 2011-04-06 DIAGNOSIS — R05 Cough: Secondary | ICD-10-CM | POA: Insufficient documentation

## 2011-04-06 MED ORDER — PSEUDOEPHEDRINE HCL 60 MG PO TABS: 60.0000 mg | ORAL_TABLET | Freq: Four times a day (QID) | ORAL | Status: AC | PRN

## 2011-04-06 MED ORDER — AMOXICILLIN-POT CLAVULANATE 875-125 MG PO TABS: 1.0000 | ORAL_TABLET | Freq: Two times a day (BID) | ORAL | Status: AC

## 2011-04-06 MED ORDER — AMOXICILLIN-POT CLAVULANATE 875-125 MG PO TABS
1.0000 | ORAL_TABLET | Freq: Once | ORAL | Status: AC
  Administered 2011-04-06: 1 via ORAL
  Filled 2011-04-06: qty 1

## 2011-04-06 NOTE — ED Provider Notes (Signed)
History     CSN: [id number]  Arrival date & time 04/06/11  7829   First MD Initiated Contact with Patient 04/06/11 [phone]      Chief Complaint  Patient presents with  . Cough  . Nasal Congestion  . Generalized Body Aches    (Consider location/radiation/quality/duration/timing/severity/associated sxs/prior treatment) Patient is a 40 y.o. female presenting with cough. The history is provided by the patient.  Cough This is a new problem. The current episode started more than 1 week ago. The problem has been gradually worsening. The cough is productive of blood-tinged sputum. Maximum temperature: She reports subjective fever.  Associated symptoms include rhinorrhea and myalgias. Pertinent negatives include no chest pain, no headaches, no sore throat, no shortness of breath and no wheezing. Associated symptoms comments: Her nasal discharge has been green with blood streaks.  She has tenderness through her cheeks and upper teeth.. She has tried nothing for the symptoms. The treatment provided no relief. She is a smoker. Her past medical history is significant for COPD.    Past Medical History  Diagnosis Date  . COPD (chronic obstructive pulmonary disease)   . Anxiety   . Panic attacks   . Back pain     Past Surgical History  Procedure Date  . Cesarean section   . Nose surgery   . Tubes tied     Family History  Problem Relation Age of Onset  . Arthritis    . Lung disease    . Cancer    . Asthma      History  Substance Use Topics  . Smoking status: Current Everyday Smoker -- 0.5 packs/day    Types: Cigarettes  . Smokeless tobacco: Not on file  . Alcohol Use: No    OB History    Grav Para Term Preterm Abortions TAB SAB Ect Mult Living                  Review of Systems  Constitutional: Negative for fever.  HENT: Positive for rhinorrhea and sinus pressure. Negative for congestion, sore throat and neck pain.   Eyes: Negative.   Respiratory: Positive for cough.  Negative for chest tightness, shortness of breath and wheezing.   Cardiovascular: Negative for chest pain.  Gastrointestinal: Negative for nausea and abdominal pain.  Genitourinary: Negative.   Musculoskeletal: Positive for myalgias. Negative for joint swelling and arthralgias.  Skin: Negative.  Negative for rash and wound.  Neurological: Negative for dizziness, weakness, light-headedness, numbness and headaches.  Hematological: Negative.   Psychiatric/Behavioral: Negative.     Allergies  Doxycycline  Home Medications   Current Outpatient Rx  Name Route Sig Dispense Refill  . XANAX PO Oral Take by mouth.      . AMOXICILLIN-POT CLAVULANATE 875-125 MG PO TABS Oral Take 1 tablet by mouth every 12 (twelve) hours. 14 tablet 0  . PROZAC PO Oral Take by mouth.      . OXYCODONE-ACETAMINOPHEN 5-325 MG PO TABS Oral Take 1 tablet by mouth every 4 (four) hours as needed for pain. 84 tablet 0  . PSEUDOEPHEDRINE HCL 60 MG PO TABS Oral Take 1 tablet (60 mg total) by mouth every 6 (six) hours as needed for congestion. 20 tablet 0  . ROPINIROLE HCL PO Oral Take by mouth.      . TOPAMAX PO Oral Take by mouth.        BP 125/83  Pulse 80  Temp 98.3 F (36.8 C)  Resp 18  Ht 5\' 1"  (1.549 m)  Wt 230 lb (104.327 kg)  BMI 43.46 kg/m2  SpO2 97%  LMP 03/23/2011  Physical Exam  Nursing note and vitals reviewed. Constitutional: She is oriented to person, place, and time. She appears well-developed and well-nourished.  HENT:  Head: Normocephalic and atraumatic.  Right Ear: External ear normal.  Left Ear: External ear normal.  Nose: Mucosal edema present. Right sinus exhibits maxillary sinus tenderness. Left sinus exhibits maxillary sinus tenderness.  Mouth/Throat: Uvula is midline, oropharynx is clear and moist and mucous membranes are normal.  Eyes: Conjunctivae are normal.  Neck: Normal range of motion.  Cardiovascular: Normal rate, regular rhythm, normal heart sounds and intact distal pulses.     Pulmonary/Chest: Effort normal and breath sounds normal. No respiratory distress. She has no wheezes. She has no rales.  Abdominal: Soft. Bowel sounds are normal. There is no tenderness.  Musculoskeletal: Normal range of motion.  Neurological: She is alert and oriented to person, place, and time.  Skin: Skin is warm and dry.  Psychiatric: She has a normal mood and affect.    ED Course  Procedures (including critical care time)  Labs Reviewed - No data to display Dg Chest 2 View  04/06/2011  *RADIOLOGY REPORT*  Clinical Data: Cough, congestion  CHEST - 2 VIEW  Comparison:  10/09/2010  Findings:  The heart size and mediastinal contours are within normal limits.  Both lungs are clear.  The visualized skeletal structures are unremarkable.  IMPRESSION: No active cardiopulmonary disease.  Original Report Authenticated By: Judie Petit. Ruel Favors, M.D.     1. Sinusitis       MDM  Sinusitis,  Augmentin,  Pseudoephedrine prescribed.        Candis Musa, PA 04/06/11 1110

## 2011-04-06 NOTE — ED Notes (Signed)
Pt c/o cough/congestion/body aches/n x 6 days. Denies v/d.

## 2011-04-07 NOTE — ED Provider Notes (Signed)
Medical screening examination/treatment/procedure(s) were performed by non-physician practitioner and as supervising physician I was immediately available for consultation/collaboration.   Shelda Jakes, MD 04/07/11 [phone]

## 2011-04-19 ENCOUNTER — Encounter (HOSPITAL_COMMUNITY): Payer: Self-pay | Admitting: *Deleted

## 2011-04-19 ENCOUNTER — Emergency Department (HOSPITAL_COMMUNITY)
Admission: EM | Admit: 2011-04-19 | Discharge: 2011-04-19 | Disposition: A | Discharge status: Home / Self Care | Payer: Medicare Other | Attending: Emergency Medicine | Admitting: Emergency Medicine

## 2011-04-19 DIAGNOSIS — S335XXA Sprain of ligaments of lumbar spine, initial encounter: Secondary | ICD-10-CM | POA: Insufficient documentation

## 2011-04-19 DIAGNOSIS — S39012A Strain of muscle, fascia and tendon of lower back, initial encounter: Secondary | ICD-10-CM

## 2011-04-19 DIAGNOSIS — X500XXA Overexertion from strenuous movement or load, initial encounter: Secondary | ICD-10-CM | POA: Insufficient documentation

## 2011-04-19 DIAGNOSIS — J449 Chronic obstructive pulmonary disease, unspecified: Secondary | ICD-10-CM | POA: Insufficient documentation

## 2011-04-19 DIAGNOSIS — M545 Low back pain, unspecified: Secondary | ICD-10-CM | POA: Insufficient documentation

## 2011-04-19 DIAGNOSIS — M79609 Pain in unspecified limb: Secondary | ICD-10-CM | POA: Insufficient documentation

## 2011-04-19 DIAGNOSIS — F411 Generalized anxiety disorder: Secondary | ICD-10-CM | POA: Insufficient documentation

## 2011-04-19 DIAGNOSIS — F17200 Nicotine dependence, unspecified, uncomplicated: Secondary | ICD-10-CM | POA: Insufficient documentation

## 2011-04-19 DIAGNOSIS — J4489 Other specified chronic obstructive pulmonary disease: Secondary | ICD-10-CM | POA: Insufficient documentation

## 2011-04-19 DIAGNOSIS — Z79899 Other long term (current) drug therapy: Secondary | ICD-10-CM | POA: Insufficient documentation

## 2011-04-19 MED ORDER — PREDNISONE 20 MG PO TABS
60.0000 mg | ORAL_TABLET | Freq: Once | ORAL | Status: AC
  Administered 2011-04-19: 60 mg via ORAL
  Filled 2011-04-19: qty 3

## 2011-04-19 MED ORDER — HYDROCODONE-ACETAMINOPHEN 5-325 MG PO TABS: 1.0000 | ORAL_TABLET | ORAL | Status: AC | PRN

## 2011-04-19 MED ORDER — PREDNISONE 20 MG PO TABS: 60.0000 mg | ORAL_TABLET | Freq: Every day | ORAL | Status: AC

## 2011-04-19 MED ORDER — CYCLOBENZAPRINE HCL 10 MG PO TABS: 10.0000 mg | ORAL_TABLET | Freq: Once | ORAL | Status: DC

## 2011-04-19 MED ORDER — HYDROCODONE-ACETAMINOPHEN 5-325 MG PO TABS
1.0000 | ORAL_TABLET | Freq: Once | ORAL | Status: AC
  Administered 2011-04-19: 1 via ORAL
  Filled 2011-04-19: qty 1

## 2011-04-19 NOTE — ED Notes (Signed)
Pt states that she hurt her lower back a few days ago moving furniture, has had problems with her back before similar to this episode, pt states that the pain is located in lower back and radiates down right hip area, pt ambulatory from triage waiting room to exam without assistance.

## 2011-04-19 NOTE — ED Provider Notes (Signed)
History     CSN: [id number]  Arrival date & time 04/19/11  1206   First MD Initiated Contact with Patient 04/19/11 1414      Chief Complaint  Patient presents with  . Back Pain    (Consider location/radiation/quality/duration/timing/severity/associated sxs/prior treatment) Patient is a 41 y.o. female presenting with back pain. The history is provided by the patient.  Back Pain  This is a recurrent problem. The current episode started 2 days ago. The problem occurs constantly. The problem has not changed since onset.The pain is associated with lifting heavy objects (Helped her dad move a wood stove up a flight of steps 2 days ago). The pain is present in the lumbar spine. The quality of the pain is described as stabbing and aching. The pain radiates to the right thigh. The pain is at a severity of 10/10. The pain is severe. The symptoms are aggravated by bending, twisting and certain positions. The pain is the same all the time. Pertinent negatives include no chest pain, no fever, no numbness, no headaches, no abdominal pain, no bladder incontinence, no dysuria, no paresthesias, no paresis and no weakness. She has tried heat and NSAIDs for the symptoms. The treatment provided no relief. Risk factors include obesity.    Past Medical History  Diagnosis Date  . COPD (chronic obstructive pulmonary disease)   . Anxiety   . Panic attacks   . Back pain     Past Surgical History  Procedure Date  . Cesarean section   . Nose surgery   . Tubes tied     Family History  Problem Relation Age of Onset  . Arthritis    . Lung disease    . Cancer    . Asthma      History  Substance Use Topics  . Smoking status: Current Everyday Smoker -- 0.5 packs/day    Types: Cigarettes  . Smokeless tobacco: Not on file  . Alcohol Use: No    OB History    Grav Para Term Preterm Abortions TAB SAB Ect Mult Living                  Review of Systems  Constitutional: Negative for fever.  HENT:  Negative for congestion, sore throat and neck pain.   Eyes: Negative.   Respiratory: Negative for chest tightness and shortness of breath.   Cardiovascular: Negative for chest pain.  Gastrointestinal: Negative for nausea and abdominal pain.  Genitourinary: Negative.  Negative for bladder incontinence and dysuria.  Musculoskeletal: Positive for back pain. Negative for joint swelling, arthralgias and gait problem.  Skin: Negative.  Negative for rash and wound.  Neurological: Negative for dizziness, weakness, light-headedness, numbness, headaches and paresthesias.  Hematological: Negative.   Psychiatric/Behavioral: Negative.     Allergies  Doxycycline  Home Medications   Current Outpatient Rx  Name Route Sig Dispense Refill  . ALPRAZOLAM 0.5 MG PO TABS Oral Take 0.5 mg by mouth 3 (three) times daily as needed. For anxiety     . FLUOXETINE HCL 20 MG PO CAPS Oral Take 20 mg by mouth daily.      Marland Kitchen ROPINIROLE HCL 2 MG PO TABS Oral Take 2 mg by mouth at bedtime.      . TOPIRAMATE 100 MG PO TABS Oral Take 100 mg by mouth daily.      Marland Kitchen HYDROCODONE-ACETAMINOPHEN 5-325 MG PO TABS Oral Take 1 tablet by mouth every 4 (four) hours as needed for pain. 20 tablet 0  . PREDNISONE  20 MG PO TABS Oral Take 3 tablets (60 mg total) by mouth daily. Take for 4 days 12 tablet 0    BP 117/75  Pulse 74  Temp(Src) 97.9 F (36.6 C) (Oral)  Resp 22  Ht 5\' 1"  (1.549 m)  Wt 230 lb (104.327 kg)  BMI 43.46 kg/m2  SpO2 100%  LMP 03/23/2011  Physical Exam  Nursing note and vitals reviewed. Constitutional: She is oriented to person, place, and time. She appears well-developed and well-nourished.  HENT:  Head: Normocephalic.  Eyes: Conjunctivae are normal.  Neck: Normal range of motion. Neck supple.  Cardiovascular: Regular rhythm and intact distal pulses.        Pedal pulses normal.  Pulmonary/Chest: Effort normal. She has no wheezes.  Abdominal: Soft. Bowel sounds are normal. She exhibits no distension  and no mass.  Musculoskeletal: Normal range of motion. She exhibits no edema.       Lumbar back: She exhibits tenderness. She exhibits no swelling, no edema and no spasm.  Neurological: She is alert and oriented to person, place, and time. She has normal strength. She displays no atrophy and no tremor. No cranial nerve deficit or sensory deficit. Gait normal.  Reflex Scores:      Patellar reflexes are 2+ on the right side and 2+ on the left side.      Achilles reflexes are 2+ on the right side and 2+ on the left side.      No strength deficit noted in hip and knee flexor and extensor muscle groups.  Ankle flexion and extension intact.  Skin: Skin is warm and dry.  Psychiatric: She has a normal mood and affect.    ED Course  Procedures (including critical care time)  Labs Reviewed - No data to display No results found.   1. Lumbar strain       MDM  No neuro deficit on exam or by history to suggest emergent or surgical presentation.  Hydrocodone,  Prednisone pulse dose prescribed.  Referral to Dr.  Hilda Lias prn.  Rest,  Avoid lifting,  Bending,  Heating pad recommended.          Candis Musa, PA 04/19/11 2151

## 2011-04-19 NOTE — ED Notes (Signed)
Low back pain , states she was moving a portable fireplace and injured her back, has a history of back problems

## 2011-04-19 NOTE — ED Notes (Signed)
Pt states that she may have to leave before getting seen, [place], advised pt to stay as long as she could and to let staff know if she left.

## 2011-04-21 NOTE — ED Provider Notes (Signed)
Medical screening examination/treatment/procedure(s) were performed by non-physician practitioner and as supervising physician I was immediately available for consultation/collaboration.   Joya Gaskins, MD 04/21/11 [phone]

## 2011-06-14 ENCOUNTER — Emergency Department (HOSPITAL_COMMUNITY)
Admission: EM | Admit: 2011-06-14 | Discharge: 2011-06-14 | Disposition: A | Discharge status: Home / Self Care | Payer: Medicare Other | Attending: Emergency Medicine | Admitting: Emergency Medicine

## 2011-06-14 ENCOUNTER — Encounter (HOSPITAL_COMMUNITY): Payer: Self-pay | Admitting: Emergency Medicine

## 2011-06-14 DIAGNOSIS — M545 Low back pain, unspecified: Secondary | ICD-10-CM | POA: Insufficient documentation

## 2011-06-14 DIAGNOSIS — R52 Pain, unspecified: Secondary | ICD-10-CM | POA: Insufficient documentation

## 2011-06-14 DIAGNOSIS — J4489 Other specified chronic obstructive pulmonary disease: Secondary | ICD-10-CM | POA: Insufficient documentation

## 2011-06-14 DIAGNOSIS — J449 Chronic obstructive pulmonary disease, unspecified: Secondary | ICD-10-CM | POA: Insufficient documentation

## 2011-06-14 DIAGNOSIS — Z79899 Other long term (current) drug therapy: Secondary | ICD-10-CM | POA: Insufficient documentation

## 2011-06-14 DIAGNOSIS — G8929 Other chronic pain: Secondary | ICD-10-CM

## 2011-06-14 DIAGNOSIS — F17200 Nicotine dependence, unspecified, uncomplicated: Secondary | ICD-10-CM | POA: Insufficient documentation

## 2011-06-14 DIAGNOSIS — F411 Generalized anxiety disorder: Secondary | ICD-10-CM | POA: Insufficient documentation

## 2011-06-14 MED ORDER — DIAZEPAM 5 MG PO TABS
5.0000 mg | ORAL_TABLET | Freq: Once | ORAL | Status: AC
  Administered 2011-06-14: 5 mg via ORAL
  Filled 2011-06-14: qty 1

## 2011-06-14 MED ORDER — METHOCARBAMOL 500 MG PO TABS: 1000.0000 mg | ORAL_TABLET | Freq: Four times a day (QID) | ORAL | Status: DC | PRN

## 2011-06-14 MED ORDER — OXYCODONE-ACETAMINOPHEN 5-325 MG PO TABS: ORAL_TABLET | ORAL | Status: DC

## 2011-06-14 MED ORDER — OXYCODONE-ACETAMINOPHEN 5-325 MG PO TABS
2.0000 | ORAL_TABLET | Freq: Once | ORAL | Status: AC
  Administered 2011-06-14: 2 via ORAL
  Filled 2011-06-14: qty 2

## 2011-06-14 NOTE — ED Notes (Signed)
Pt c/o chronic back pain flareup.

## 2011-06-14 NOTE — ED Notes (Signed)
Chronic low back pain, says she finds it hard to get out of bed.  She says she has arthritis in her back.. No recent injury known. Says she has been having pain in her back for 2 years.

## 2011-06-14 NOTE — Discharge Instructions (Signed)
RESOURCE GUIDE  Dental Problems  Patients with Medicaid: [facility]                     [facility] [address]                                           [address] Phone:  [phone]                                                  Phone:  [phone]  If unable to pay or uninsured, contact:  [facility] or [facility] to become qualified for the adult dental clinic.  Chronic Pain Problems Contact [facility]  [phone] Patients need to be referred by their primary care doctor.  Insufficient Money for Medicine Contact [employer]:  call "211" or [facility] 271-5999.  No Primary Care Doctor Call [facility]  832-8000 Other agencies that provide inexpensive medical care    [facility]  832-8035    [facility]  832-7272    [facility]  [phone]    Women's Clinic  832-4777    [facility]  [phone]    [facility]  [phone]  Psychological Services [facility]  [phone] [facility]  [phone] [facility]   800 853-5163 (emergency services 641-4993)  Substance Abuse Resources [facility]  336-882-2125 [facility] 336-784-9470 [facility] 336-285-9073 [facility] 336-845-3988 Residential & Outpatient Substance Abuse Program  800-659-3381  Abuse/Neglect [place] Child Abuse Hotline (336) 641-3795 [place] Child Abuse Hotline 800-378-5315 (After Hours)  Emergency Shelter [facility] (336) 271-5985  M[facility] (336) 275-9566 [facility] (704) 372-4663  MRSA Hotline #:   832-7006    [place] [facility]  [facility] [place]     [employer]                          [facility] [address] [place]                       [address]      [address]  [place]                                                [place]                            [place] Phone:  [phone]                                   Phone:  [phone]                 Phone:  [phone]  [facility] Phone:  342-8316    [place] Child Abuse Hotline (336) 342-1394 (336) 342-3537 (After Hours)   Take the prescriptions as directed.  Apply moist heat or ice to the area(s) of discomfort, for 15 minutes at a time, several times per day for the next few days.  Do not fall asleep on a heating or ice pack.  Call your regular medical doctor today to schedule a follow up appointment this week.  Return to the Emergency Department immediately if worsening.  

## 2011-06-14 NOTE — ED Provider Notes (Signed)
History     CSN: [id number]  Arrival date & time 06/14/11  1241   First MD Initiated Contact with Patient 06/14/11 1321      Chief Complaint  Patient presents with  . Back Pain    HPI Pt was seen at 1320.  Per pt, c/o gradual onset and persistence of constant acute flair of her chronic low back "pain" for the past several days.  Denies any change in her usual chronic pain pattern for the past several years.  Denies incont/retention of bowel or bladder, no saddle anesthesia, no focal motor weakness, no tingling/numbness in extremities, no fevers, no injury.   The symptoms have been associated with no other complaints. The patient has a significant history of similar symptoms previously, recently being evaluated for this complaint and multiple prior evals for same.     Past Medical History  Diagnosis Date  . COPD (chronic obstructive pulmonary disease)   . Anxiety   . Panic attacks   . Back pain     Past Surgical History  Procedure Date  . Cesarean section   . Nose surgery   . Tubes tied     Family History  Problem Relation Age of Onset  . Arthritis    . Lung disease    . Cancer    . Asthma      History  Substance Use Topics  . Smoking status: Current Everyday Smoker -- 0.5 packs/day    Types: Cigarettes  . Smokeless tobacco: Not on file  . Alcohol Use: No    Review of Systems ROS: Statement: All systems negative except as marked or noted in the HPI; Constitutional: Negative for fever and chills. ; ; Eyes: Negative for eye pain, redness and discharge. ; ; ENMT: Negative for ear pain, hoarseness, nasal congestion, sinus pressure and sore throat. ; ; Cardiovascular: Negative for chest pain, palpitations, diaphoresis, dyspnea and peripheral edema. ; ; Respiratory: Negative for cough, wheezing and stridor. ; ; Gastrointestinal: Negative for nausea, vomiting, diarrhea, abdominal pain, blood in stool, hematemesis, jaundice and rectal bleeding. . ; ; Genitourinary: Negative  for dysuria, flank pain and hematuria. ; ; Musculoskeletal: +LBP. Negative for neck pain. Negative for swelling and trauma.; ; Skin: Negative for pruritus, rash, abrasions, blisters, bruising and skin lesion.; ; Neuro: Negative for headache, lightheadedness and neck stiffness. Negative for weakness, altered level of consciousness , altered mental status, extremity weakness, paresthesias, involuntary movement, seizure and syncope.     Allergies  Doxycycline  Home Medications   Current Outpatient Rx  Name Route Sig Dispense Refill  . ALPRAZOLAM 0.5 MG PO TABS Oral Take 0.5 mg by mouth 3 (three) times daily as needed. For anxiety     . FLUOXETINE HCL 20 MG PO CAPS Oral Take 20 mg by mouth daily.      Marland Kitchen ROPINIROLE HCL 2 MG PO TABS Oral Take 2 mg by mouth at bedtime.      . TOPIRAMATE 100 MG PO TABS Oral Take 100 mg by mouth daily.        BP 106/65  Pulse 80  Temp(Src) 97.4 F (36.3 C) (Oral)  Resp 20  Ht 5\' 1"  (1.549 m)  Wt 220 lb (99.791 kg)  BMI 41.57 kg/m2  SpO2 100%  LMP 06/13/2011  Physical Exam 1325: Physical examination:  Nursing notes reviewed; Vital signs and O2 SAT reviewed;  Constitutional: Well developed, Well nourished, Well hydrated, In no acute distress; Head:  Normocephalic, atraumatic; Eyes: EOMI, PERRL, No scleral icterus;  ENMT: Mouth and pharynx normal, Mucous membranes moist; Neck: Supple, Full range of motion, No lymphadenopathy; Cardiovascular: Regular rate and rhythm, No murmur, rub, or gallop; Respiratory: Breath sounds clear & equal bilaterally, No rales, rhonchi, wheezes, or rub, Normal respiratory effort/excursion; Chest: Nontender, Movement normal; Abdomen: Soft, Nontender, Nondistended, Normal bowel sounds; Genitourinary: No CVA tenderness; Spine:  No midline CS, TS, LS tenderness. +TTP bilat lower lumbar paraspinal muscles; Extremities: Pulses normal, No tenderness, No edema, No calf edema or asymmetry.; Neuro: AA&Ox3, Major CN grossly intact. Strength 5/5 equal  bilat UE's and LE's, including great toe dorsiflexion.  DTR 2/4 equal bilat UE's and LE's.  No gross sensory deficits.  Neg straight leg raises bilat..; Skin: Color normal, Warm, Dry, no rash.     ED Course  Procedures   MDM  MDM Reviewed: nursing note, vitals and previous chart      Acute flair of chronic LBP, multiple previous evals for same.  Will tx symptomatically at this time, proper body mechanics (ie: moving from lay/sit on stretcher) reviewed with pt.  Pt encouraged to f/u with her PMD, Ortho or Pain Management for continuity of care.    Laray Anger, DO 06/16/11 0006

## 2011-06-23 ENCOUNTER — Encounter (HOSPITAL_COMMUNITY): Payer: Self-pay | Admitting: *Deleted

## 2011-06-23 ENCOUNTER — Emergency Department (HOSPITAL_COMMUNITY)
Admission: EM | Admit: 2011-06-23 | Discharge: 2011-06-23 | Disposition: A | Discharge status: Home / Self Care | Payer: Medicare Other | Attending: Emergency Medicine | Admitting: Emergency Medicine

## 2011-06-23 ENCOUNTER — Emergency Department (HOSPITAL_COMMUNITY): Payer: Medicare Other

## 2011-06-23 DIAGNOSIS — T07XXXA Unspecified multiple injuries, initial encounter: Secondary | ICD-10-CM

## 2011-06-23 DIAGNOSIS — S5010XA Contusion of unspecified forearm, initial encounter: Secondary | ICD-10-CM | POA: Insufficient documentation

## 2011-06-23 DIAGNOSIS — IMO0001 Reserved for inherently not codable concepts without codable children: Secondary | ICD-10-CM | POA: Insufficient documentation

## 2011-06-23 DIAGNOSIS — Z7982 Long term (current) use of aspirin: Secondary | ICD-10-CM | POA: Insufficient documentation

## 2011-06-23 DIAGNOSIS — J449 Chronic obstructive pulmonary disease, unspecified: Secondary | ICD-10-CM | POA: Insufficient documentation

## 2011-06-23 DIAGNOSIS — J4489 Other specified chronic obstructive pulmonary disease: Secondary | ICD-10-CM | POA: Insufficient documentation

## 2011-06-23 DIAGNOSIS — F17200 Nicotine dependence, unspecified, uncomplicated: Secondary | ICD-10-CM | POA: Insufficient documentation

## 2011-06-23 DIAGNOSIS — M25519 Pain in unspecified shoulder: Secondary | ICD-10-CM | POA: Insufficient documentation

## 2011-06-23 DIAGNOSIS — F411 Generalized anxiety disorder: Secondary | ICD-10-CM | POA: Insufficient documentation

## 2011-06-23 DIAGNOSIS — Z79899 Other long term (current) drug therapy: Secondary | ICD-10-CM | POA: Insufficient documentation

## 2011-06-23 DIAGNOSIS — R079 Chest pain, unspecified: Secondary | ICD-10-CM | POA: Insufficient documentation

## 2011-06-23 DIAGNOSIS — Z86711 Personal history of pulmonary embolism: Secondary | ICD-10-CM | POA: Insufficient documentation

## 2011-06-23 HISTORY — DX: Other pulmonary embolism without acute cor pulmonale: I26.99

## 2011-06-23 MED ORDER — DIAZEPAM 10 MG PO TABS: 5.0000 mg | ORAL_TABLET | Freq: Three times a day (TID) | ORAL | Status: DC | PRN

## 2011-06-23 MED ORDER — OXYCODONE-ACETAMINOPHEN 5-325 MG PO TABS
2.0000 | ORAL_TABLET | Freq: Once | ORAL | Status: AC
  Administered 2011-06-23: 2 via ORAL
  Filled 2011-06-23: qty 2

## 2011-06-23 MED ORDER — DIAZEPAM 5 MG PO TABS
5.0000 mg | ORAL_TABLET | Freq: Once | ORAL | Status: AC
  Administered 2011-06-23: 5 mg via ORAL
  Filled 2011-06-23: qty 1

## 2011-06-23 MED ORDER — HYDROCODONE-ACETAMINOPHEN 5-325 MG PO TABS: 2.0000 | ORAL_TABLET | ORAL | Status: AC | PRN
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
## 2011-06-23 NOTE — ED Notes (Signed)
Pain lt arm , chest , chest and lower back.
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
## 2011-06-23 NOTE — Discharge Instructions (Signed)
Use ice on the sore spots 3 times a day for 2-3 days. See your doctor as needed for problems.  Contusion A contusion is a deep bruise. Contusions happen when an injury causes bleeding under the skin. Signs of bruising include pain, puffiness (swelling), and discolored skin. The contusion may turn blue, purple, or yellow. HOME CARE   Put ice on the injured area.   Put ice in a plastic bag.   Place a towel between your skin and the bag.   Leave the ice on for 15 to 20 minutes, 3 to 4 times a day.   Only take medicine as told by your doctor.   Rest the injured area.   If possible, raise (elevate) the injured area to lessen puffiness.  GET HELP RIGHT AWAY IF:   You have more bruising or puffiness.   You have pain that is getting worse.   Your puffiness or pain is not helped by medicine.  MAKE SURE YOU:   Understand these instructions.   Will watch your condition.   Will get help right away if you are not doing well or get worse.  Document Released: 09/15/2007 Document Revised: 03/18/2011 Document Reviewed: 02/01/2011 [facility] Patient Information 2012 [place], [place].

## 2011-06-28 ENCOUNTER — Emergency Department (HOSPITAL_COMMUNITY)
Admission: EM | Admit: 2011-06-28 | Discharge: 2011-06-28 | Disposition: A | Discharge status: Home / Self Care | Payer: Medicare Other | Attending: Emergency Medicine | Admitting: Emergency Medicine

## 2011-06-28 ENCOUNTER — Encounter (HOSPITAL_COMMUNITY): Payer: Self-pay | Admitting: *Deleted

## 2011-06-28 DIAGNOSIS — M25559 Pain in unspecified hip: Secondary | ICD-10-CM | POA: Insufficient documentation

## 2011-06-28 DIAGNOSIS — J4489 Other specified chronic obstructive pulmonary disease: Secondary | ICD-10-CM | POA: Insufficient documentation

## 2011-06-28 DIAGNOSIS — Z86711 Personal history of pulmonary embolism: Secondary | ICD-10-CM | POA: Insufficient documentation

## 2011-06-28 DIAGNOSIS — M545 Low back pain, unspecified: Secondary | ICD-10-CM | POA: Insufficient documentation

## 2011-06-28 DIAGNOSIS — M542 Cervicalgia: Secondary | ICD-10-CM | POA: Insufficient documentation

## 2011-06-28 DIAGNOSIS — F410 Panic disorder [episodic paroxysmal anxiety] without agoraphobia: Secondary | ICD-10-CM | POA: Insufficient documentation

## 2011-06-28 DIAGNOSIS — F17200 Nicotine dependence, unspecified, uncomplicated: Secondary | ICD-10-CM | POA: Insufficient documentation

## 2011-06-28 DIAGNOSIS — R52 Pain, unspecified: Secondary | ICD-10-CM

## 2011-06-28 DIAGNOSIS — J449 Chronic obstructive pulmonary disease, unspecified: Secondary | ICD-10-CM | POA: Insufficient documentation

## 2011-06-28 MED ORDER — IBUPROFEN 800 MG PO TABS
800.0000 mg | ORAL_TABLET | Freq: Once | ORAL | Status: AC
  Administered 2011-06-28: 800 mg via ORAL
  Filled 2011-06-28: qty 1

## 2011-06-28 MED ORDER — HYDROCODONE-ACETAMINOPHEN 5-325 MG PO TABS
2.0000 | ORAL_TABLET | Freq: Once | ORAL | Status: AC
  Administered 2011-06-28: 2 via ORAL
  Filled 2011-06-28: qty 2

## 2011-06-28 MED ORDER — DIAZEPAM 5 MG PO TABS
5.0000 mg | ORAL_TABLET | Freq: Once | ORAL | Status: AC
  Administered 2011-06-28: 5 mg via ORAL
  Filled 2011-06-28: qty 1

## 2011-06-28 MED ORDER — DIAZEPAM 5 MG PO TABS: ORAL_TABLET | ORAL | Status: DC

## 2011-06-28 MED ORDER — HYDROCODONE-ACETAMINOPHEN 7.5-325 MG PO TABS: 1.0000 | ORAL_TABLET | ORAL | Status: AC | PRN

## 2011-06-28 MED ORDER — MELOXICAM 7.5 MG PO TABS: 7.5000 mg | ORAL_TABLET | Freq: Every day | ORAL | Status: DC
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
## 2011-06-28 NOTE — Discharge Instructions (Signed)
Please use Mobic daily with food for inflammation. Norco and Valium may cause drowsiness, please use with caution.

## 2011-06-28 NOTE — ED Notes (Signed)
Exam by H Bryant PA before seen by me 

## 2011-07-05 NOTE — ED Provider Notes (Signed)
Medical screening examination/treatment/procedure(s) were performed by non-physician practitioner and as supervising physician I was immediately available for consultation/collaboration.    Shelda Jakes, MD 07/05/11 [phone]

## 2011-07-26 ENCOUNTER — Emergency Department (HOSPITAL_COMMUNITY): Payer: Medicare Other

## 2011-07-26 ENCOUNTER — Encounter (HOSPITAL_COMMUNITY): Payer: Self-pay | Admitting: *Deleted

## 2011-07-26 ENCOUNTER — Emergency Department (HOSPITAL_COMMUNITY)
Admission: EM | Admit: 2011-07-26 | Discharge: 2011-07-26 | Disposition: A | Discharge status: Home / Self Care | Payer: Medicare Other | Attending: Emergency Medicine | Admitting: Emergency Medicine

## 2011-07-26 DIAGNOSIS — J4489 Other specified chronic obstructive pulmonary disease: Secondary | ICD-10-CM | POA: Insufficient documentation

## 2011-07-26 DIAGNOSIS — Z79899 Other long term (current) drug therapy: Secondary | ICD-10-CM | POA: Insufficient documentation

## 2011-07-26 DIAGNOSIS — Z7982 Long term (current) use of aspirin: Secondary | ICD-10-CM | POA: Insufficient documentation

## 2011-07-26 DIAGNOSIS — F17200 Nicotine dependence, unspecified, uncomplicated: Secondary | ICD-10-CM | POA: Insufficient documentation

## 2011-07-26 DIAGNOSIS — J40 Bronchitis, not specified as acute or chronic: Secondary | ICD-10-CM

## 2011-07-26 DIAGNOSIS — J449 Chronic obstructive pulmonary disease, unspecified: Secondary | ICD-10-CM | POA: Insufficient documentation

## 2011-07-26 DIAGNOSIS — Z86711 Personal history of pulmonary embolism: Secondary | ICD-10-CM | POA: Insufficient documentation

## 2011-07-26 MED ORDER — HYDROCOD POLST-CHLORPHEN POLST 10-8 MG/5ML PO LQCR
5.0000 mL | Freq: Once | ORAL | Status: AC
  Administered 2011-07-26: 5 mL via ORAL
  Filled 2011-07-26: qty 5

## 2011-07-26 MED ORDER — HYDROCOD POLST-CHLORPHEN POLST 10-8 MG/5ML PO LQCR: 5.0000 mL | Freq: Two times a day (BID) | ORAL | Status: DC

## 2011-07-26 MED ORDER — BENZONATATE 100 MG PO CAPS: 100.0000 mg | ORAL_CAPSULE | Freq: Three times a day (TID) | ORAL | Status: AC | PRN

## 2011-07-26 NOTE — Discharge Instructions (Signed)
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
## 2011-07-26 NOTE — ED Notes (Signed)
Pt states has had cough and URI symptoms x 1 week. Pt denies sore throat, however has laryngitis. Nonproductive cough and low grade fevers. per pt.

## 2011-07-26 NOTE — ED Provider Notes (Signed)
History/physical exam/procedure(s) were performed by non-physician practitioner and as supervising physician I was immediately available for consultation/collaboration. I have reviewed all notes and am in agreement with care and plan.   Hilario Quarry, MD 07/26/11 [phone]
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
## 2011-11-16 ENCOUNTER — Emergency Department (HOSPITAL_COMMUNITY)
Admission: EM | Admit: 2011-11-16 | Discharge: 2011-11-16 | Disposition: A | Discharge status: Home / Self Care | Payer: Medicare Other | Attending: Emergency Medicine | Admitting: Emergency Medicine

## 2011-11-16 ENCOUNTER — Encounter (HOSPITAL_COMMUNITY): Payer: Self-pay

## 2011-11-16 DIAGNOSIS — S39012A Strain of muscle, fascia and tendon of lower back, initial encounter: Secondary | ICD-10-CM

## 2011-11-16 DIAGNOSIS — J4489 Other specified chronic obstructive pulmonary disease: Secondary | ICD-10-CM | POA: Insufficient documentation

## 2011-11-16 DIAGNOSIS — F17200 Nicotine dependence, unspecified, uncomplicated: Secondary | ICD-10-CM | POA: Insufficient documentation

## 2011-11-16 DIAGNOSIS — IMO0002 Reserved for concepts with insufficient information to code with codable children: Secondary | ICD-10-CM | POA: Insufficient documentation

## 2011-11-16 DIAGNOSIS — F411 Generalized anxiety disorder: Secondary | ICD-10-CM | POA: Insufficient documentation

## 2011-11-16 DIAGNOSIS — J449 Chronic obstructive pulmonary disease, unspecified: Secondary | ICD-10-CM | POA: Insufficient documentation

## 2011-11-16 DIAGNOSIS — Z86711 Personal history of pulmonary embolism: Secondary | ICD-10-CM | POA: Insufficient documentation

## 2011-11-16 DIAGNOSIS — Y9289 Other specified places as the place of occurrence of the external cause: Secondary | ICD-10-CM | POA: Insufficient documentation

## 2011-11-16 DIAGNOSIS — S335XXA Sprain of ligaments of lumbar spine, initial encounter: Secondary | ICD-10-CM | POA: Insufficient documentation

## 2011-11-16 MED ORDER — OXYCODONE-ACETAMINOPHEN 5-325 MG PO TABS
1.0000 | ORAL_TABLET | Freq: Once | ORAL | Status: AC
  Administered 2011-11-16: 1 via ORAL
  Filled 2011-11-16: qty 1

## 2011-11-16 MED ORDER — IBUPROFEN 800 MG PO TABS: 800.0000 mg | ORAL_TABLET | Freq: Three times a day (TID) | ORAL | Status: AC | PRN

## 2011-11-16 MED ORDER — OXYCODONE-ACETAMINOPHEN 5-325 MG PO TABS: 1.0000 | ORAL_TABLET | ORAL | Status: AC | PRN

## 2011-11-16 MED ORDER — IBUPROFEN 800 MG PO TABS
800.0000 mg | ORAL_TABLET | Freq: Once | ORAL | Status: AC
  Administered 2011-11-16: 800 mg via ORAL
  Filled 2011-11-16: qty 1
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
## 2011-11-21 NOTE — ED Provider Notes (Signed)
Medical screening examination/treatment/procedure(s) were performed by non-physician practitioner and as supervising physician I was immediately available for consultation/collaboration.   Charles B. Bernette Mayers, MD 11/21/11 [phone]

## 2012-01-22 ENCOUNTER — Encounter (HOSPITAL_COMMUNITY): Payer: Self-pay | Admitting: *Deleted

## 2012-01-22 ENCOUNTER — Emergency Department (HOSPITAL_COMMUNITY)
Admission: EM | Admit: 2012-01-22 | Discharge: 2012-01-23 | Disposition: A | Discharge status: Home / Self Care | Payer: Medicare Other | Attending: Emergency Medicine | Admitting: Emergency Medicine

## 2012-01-22 DIAGNOSIS — J4489 Other specified chronic obstructive pulmonary disease: Secondary | ICD-10-CM | POA: Insufficient documentation

## 2012-01-22 DIAGNOSIS — Z7982 Long term (current) use of aspirin: Secondary | ICD-10-CM | POA: Insufficient documentation

## 2012-01-22 DIAGNOSIS — G8929 Other chronic pain: Secondary | ICD-10-CM | POA: Insufficient documentation

## 2012-01-22 DIAGNOSIS — IMO0002 Reserved for concepts with insufficient information to code with codable children: Secondary | ICD-10-CM

## 2012-01-22 DIAGNOSIS — F17200 Nicotine dependence, unspecified, uncomplicated: Secondary | ICD-10-CM | POA: Insufficient documentation

## 2012-01-22 DIAGNOSIS — J449 Chronic obstructive pulmonary disease, unspecified: Secondary | ICD-10-CM | POA: Insufficient documentation

## 2012-01-22 DIAGNOSIS — Z888 Allergy status to other drugs, medicaments and biological substances status: Secondary | ICD-10-CM | POA: Insufficient documentation

## 2012-01-22 DIAGNOSIS — F410 Panic disorder [episodic paroxysmal anxiety] without agoraphobia: Secondary | ICD-10-CM | POA: Insufficient documentation

## 2012-01-22 HISTORY — DX: Other chronic pain: G89.29

## 2012-01-22 HISTORY — DX: Dorsalgia, unspecified: M54.9

## 2012-01-22 MED ORDER — METHOCARBAMOL 500 MG PO TABS: ORAL_TABLET | ORAL | Status: DC

## 2012-01-22 MED ORDER — PREDNISONE 20 MG PO TABS
60.0000 mg | ORAL_TABLET | Freq: Once | ORAL | Status: AC
  Administered 2012-01-23: 60 mg via ORAL
  Filled 2012-01-22: qty 3

## 2012-01-22 MED ORDER — PREDNISONE 10 MG PO TABS: ORAL_TABLET | ORAL | Status: DC

## 2012-01-22 MED ORDER — HYDROCODONE-ACETAMINOPHEN 5-325 MG PO TABS
1.0000 | ORAL_TABLET | Freq: Once | ORAL | Status: AC
  Administered 2012-01-23: 1 via ORAL
  Filled 2012-01-22: qty 1

## 2012-01-22 MED ORDER — HYDROCODONE-ACETAMINOPHEN 5-325 MG PO TABS: 1.0000 | ORAL_TABLET | ORAL | Status: DC | PRN

## 2012-01-22 MED ORDER — DIAZEPAM 5 MG PO TABS
5.0000 mg | ORAL_TABLET | Freq: Once | ORAL | Status: AC
  Administered 2012-01-23: 5 mg via ORAL
  Filled 2012-01-22: qty 1

## 2012-01-22 NOTE — ED Notes (Signed)
Pt with chronic back pain 

## 2012-01-22 NOTE — ED Provider Notes (Signed)
History     CSN: [id number]  Arrival date & time 01/22/12  2113   First MD Initiated Contact with Patient 01/22/12 2301      Chief Complaint  Patient presents with  . Back Pain    (Consider location/radiation/quality/duration/timing/severity/associated sxs/prior treatment) Patient is a 41 y.o. female presenting with back pain. The history is provided by the patient.  Back Pain  This is a chronic problem. The problem occurs daily. The problem has been gradually worsening. The pain is associated with no known injury (hx of ddd). The pain is present in the lumbar spine. The pain is moderate. The symptoms are aggravated by certain positions. The pain is worse during the day. Pertinent negatives include no chest pain, no numbness, no abdominal pain, no bowel incontinence, no perianal numbness, no bladder incontinence and no dysuria. She has tried nothing for the symptoms.    Past Medical History  Diagnosis Date  . COPD (chronic obstructive pulmonary disease)   . Anxiety   . Panic attacks   . Back pain   . Pulmonary embolism   . Chronic back pain     Past Surgical History  Procedure Date  . Cesarean section   . Nose surgery   . Tubes tied     Family History  Problem Relation Age of Onset  . Arthritis    . Lung disease    . Cancer    . Asthma      History  Substance Use Topics  . Smoking status: Current Every Day Smoker -- 0.5 packs/day    Types: Cigarettes  . Smokeless tobacco: Not on file  . Alcohol Use: No    OB History    Grav Para Term Preterm Abortions TAB SAB Ect Mult Living                  Review of Systems  Constitutional: Negative for activity change.       All ROS Neg except as noted in HPI  HENT: Negative for nosebleeds and neck pain.   Eyes: Negative for photophobia and discharge.  Respiratory: Negative for cough, shortness of breath and wheezing.   Cardiovascular: Negative for chest pain and palpitations.  Gastrointestinal: Negative for  abdominal pain, blood in stool and bowel incontinence.  Genitourinary: Negative for bladder incontinence, dysuria, frequency and hematuria.  Musculoskeletal: Positive for back pain. Negative for arthralgias.  Skin: Negative.   Neurological: Negative for dizziness, seizures, speech difficulty and numbness.  Psychiatric/Behavioral: Negative for hallucinations and confusion.    Allergies  Doxycycline and Tramadol  Home Medications   Current Outpatient Rx  Name Route Sig Dispense Refill  . ALPRAZOLAM 0.5 MG PO TABS Oral Take 0.5 mg by mouth 3 (three) times daily as needed. For anxiety     . ASPIRIN EC 81 MG PO TBEC Oral Take 81 mg by mouth daily.    Marland Kitchen HYDROCOD POLST-CPM POLST ER 10-8 MG/5ML PO LQCR Oral Take 5 mLs by mouth every 12 (twelve) hours. 75 mL 0  . DIAZEPAM 10 MG PO TABS Oral Take 0.5 tablets (5 mg total) by mouth every 8 (eight) hours as needed for anxiety. As needed for muscle spasm 15 tablet 0  . DIAZEPAM 5 MG PO TABS  1 po tid for spasm/pain 21 tablet 0  . MELOXICAM 7.5 MG PO TABS Oral Take 1 tablet (7.5 mg total) by mouth daily. 17 tablet 0  . ROPINIROLE HCL 2 MG PO TABS Oral Take 2 mg by mouth at bedtime.      Marland Kitchen  TOPIRAMATE 100 MG PO TABS Oral Take 100 mg by mouth daily.        BP 126/89  Pulse 92  Temp 98.3 F (36.8 C) (Oral)  Resp 20  Ht 5\' 1"  (1.549 m)  Wt 250 lb (113.399 kg)  BMI 47.24 kg/m2  SpO2 100%  LMP 01/01/2012  Physical Exam  Nursing note and vitals reviewed. Constitutional: She is oriented to person, place, and time. She appears well-developed and well-nourished.  Non-toxic appearance.  HENT:  Head: Normocephalic.  Right Ear: Tympanic membrane and external ear normal.  Left Ear: Tympanic membrane and external ear normal.       Nasal congestion  Eyes: EOM and lids are normal. Pupils are equal, round, and reactive to light.  Neck: Normal range of motion. Neck supple. Carotid bruit is not present.  Cardiovascular: Normal rate, regular rhythm, normal  heart sounds, intact distal pulses and normal pulses.   Pulmonary/Chest: Breath sounds normal. No respiratory distress.  Abdominal: Soft. Bowel sounds are normal. There is no tenderness. There is no guarding.  Musculoskeletal: Normal range of motion.       There is pain to palpation of the lumbar spine. No palpable step off noted. There is lumbar paraspinal tenderness to palpation in change of position. No hot areas appreciated.  Lymphadenopathy:       Head (right side): No submandibular adenopathy present.       Head (left side): No submandibular adenopathy present.    She has no cervical adenopathy.  Neurological: She is alert and oriented to person, place, and time. She has normal strength. No cranial nerve deficit or sensory deficit. She exhibits normal muscle tone. Coordination normal.       Gait is steady there is no evidence of foot drop. There no sensory deficits of the lower extremities.  Skin: Skin is warm and dry.  Psychiatric: She has a normal mood and affect. Her speech is normal.    ED Course  Procedures (including critical care time)  Labs Reviewed - No data to display No results found.   No diagnosis found.    MDM  I have reviewed nursing notes, vital signs, and all appropriate lab and imaging results for this patient. Patient has history of chronic back pain with degenerative disc disease problems. She has been given the name of pain management and rehabilitation dilatation specialists but lost the name. The patient denies any recent injury or trauma to her back and states that this is a new acute exacerbation of her chronic pain. No gross deficits appreciated on today's examination. Prescription for Robaxin 500 mg, prednisone taper, and Norco 5 mg given to the patient. Pt given referral to Dr Eduard Clos.      Kathie Dike, [place] 01/22/12 2356

## 2012-01-24 NOTE — ED Provider Notes (Signed)
Medical screening examination/treatment/procedure(s) were performed by non-physician practitioner and as supervising physician I was immediately available for consultation/collaboration.  Nicoletta Dress. Colon Branch, MD 01/24/12 [phone]

## 2012-01-25 ENCOUNTER — Emergency Department (HOSPITAL_COMMUNITY)
Admission: EM | Admit: 2012-01-25 | Discharge: 2012-01-25 | Disposition: A | Discharge status: Home / Self Care | Payer: Medicare Other | Attending: Emergency Medicine | Admitting: Emergency Medicine

## 2012-01-25 ENCOUNTER — Encounter (HOSPITAL_COMMUNITY): Payer: Self-pay | Admitting: *Deleted

## 2012-01-25 ENCOUNTER — Emergency Department (HOSPITAL_COMMUNITY): Payer: Medicare Other

## 2012-01-25 DIAGNOSIS — M542 Cervicalgia: Secondary | ICD-10-CM | POA: Insufficient documentation

## 2012-01-25 DIAGNOSIS — Z7982 Long term (current) use of aspirin: Secondary | ICD-10-CM | POA: Insufficient documentation

## 2012-01-25 DIAGNOSIS — J449 Chronic obstructive pulmonary disease, unspecified: Secondary | ICD-10-CM | POA: Insufficient documentation

## 2012-01-25 DIAGNOSIS — Z79899 Other long term (current) drug therapy: Secondary | ICD-10-CM | POA: Insufficient documentation

## 2012-01-25 DIAGNOSIS — S1093XA Contusion of unspecified part of neck, initial encounter: Secondary | ICD-10-CM | POA: Insufficient documentation

## 2012-01-25 DIAGNOSIS — S00439A Contusion of unspecified ear, initial encounter: Secondary | ICD-10-CM

## 2012-01-25 DIAGNOSIS — S1091XA Abrasion of unspecified part of neck, initial encounter: Secondary | ICD-10-CM

## 2012-01-25 DIAGNOSIS — R0602 Shortness of breath: Secondary | ICD-10-CM | POA: Insufficient documentation

## 2012-01-25 DIAGNOSIS — J4489 Other specified chronic obstructive pulmonary disease: Secondary | ICD-10-CM | POA: Insufficient documentation

## 2012-01-25 DIAGNOSIS — F411 Generalized anxiety disorder: Secondary | ICD-10-CM | POA: Insufficient documentation

## 2012-01-25 DIAGNOSIS — S0003XA Contusion of scalp, initial encounter: Secondary | ICD-10-CM | POA: Insufficient documentation
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
## 2012-01-25 NOTE — ED Notes (Signed)
Also c/o dizziness. 

## 2012-01-25 NOTE — ED Notes (Signed)
I had to awaken pt to d/c her.

## 2012-01-26 NOTE — ED Provider Notes (Signed)
Medical screening examination/treatment/procedure(s) were performed by non-physician practitioner and as supervising physician I was immediately available for consultation/collaboration.   Rhea Kaelin L Ryelee Albee, MD 01/26/12 0007 

## 2012-02-08 ENCOUNTER — Emergency Department (HOSPITAL_COMMUNITY)
Admission: EM | Admit: 2012-02-08 | Discharge: 2012-02-08 | Disposition: A | Discharge status: Home / Self Care | Payer: Medicare Other | Attending: Emergency Medicine | Admitting: Emergency Medicine

## 2012-02-08 ENCOUNTER — Encounter (HOSPITAL_COMMUNITY): Payer: Self-pay | Admitting: *Deleted

## 2012-02-08 DIAGNOSIS — I2699 Other pulmonary embolism without acute cor pulmonale: Secondary | ICD-10-CM | POA: Insufficient documentation

## 2012-02-08 DIAGNOSIS — Z79899 Other long term (current) drug therapy: Secondary | ICD-10-CM | POA: Insufficient documentation

## 2012-02-08 DIAGNOSIS — M545 Low back pain, unspecified: Secondary | ICD-10-CM

## 2012-02-08 DIAGNOSIS — F17200 Nicotine dependence, unspecified, uncomplicated: Secondary | ICD-10-CM | POA: Insufficient documentation

## 2012-02-08 DIAGNOSIS — Z7982 Long term (current) use of aspirin: Secondary | ICD-10-CM | POA: Insufficient documentation

## 2012-02-08 DIAGNOSIS — M549 Dorsalgia, unspecified: Secondary | ICD-10-CM | POA: Insufficient documentation

## 2012-02-08 DIAGNOSIS — F411 Generalized anxiety disorder: Secondary | ICD-10-CM | POA: Insufficient documentation

## 2012-02-08 DIAGNOSIS — G8929 Other chronic pain: Secondary | ICD-10-CM

## 2012-02-08 DIAGNOSIS — M1990 Unspecified osteoarthritis, unspecified site: Secondary | ICD-10-CM | POA: Insufficient documentation

## 2012-02-08 HISTORY — DX: Reserved for concepts with insufficient information to code with codable children: IMO0002

## 2012-02-08 MED ORDER — HYDROCODONE-ACETAMINOPHEN 5-325 MG PO TABS
1.0000 | ORAL_TABLET | Freq: Once | ORAL | Status: AC
  Administered 2012-02-08: 1 via ORAL
  Filled 2012-02-08: qty 1

## 2012-02-08 MED ORDER — HYDROCODONE-ACETAMINOPHEN 5-325 MG PO TABS: 1.0000 | ORAL_TABLET | Freq: Four times a day (QID) | ORAL | Status: AC | PRN

## 2012-02-08 NOTE — ED Notes (Signed)
Pt presents with chronic back and left knee pain. Pt denies injury at this time. Pt is grossly obese, discussed with pt the risk of excess weight.

## 2012-02-08 NOTE — ED Provider Notes (Signed)
History     CSN: [id number]  Arrival date & time 02/08/12  1251   First MD Initiated Contact with Patient 02/08/12 1407      Chief Complaint  Patient presents with  . Back Pain    (Consider location/radiation/quality/duration/timing/severity/associated sxs/prior treatment) HPI Comments: Pt has chronic low back pain secondary to DDD.  Her PCP in [place] is arranging for her to see an orthopedist in ~ 1 month.  No recent injury  Patient is a 41 y.o. female presenting with back pain. The history is provided by the patient. No language interpreter was used.  Back Pain  This is a chronic problem. The problem occurs constantly. The pain is associated with no known injury. The pain is present in the lumbar spine. The quality of the pain is described as stabbing. The pain does not radiate. The pain is severe. The symptoms are aggravated by bending and twisting. The pain is the same all the time. Pertinent negatives include no fever, no numbness, no bowel incontinence, no perianal numbness, no bladder incontinence, no dysuria, no pelvic pain, no leg pain, no paresthesias, no paresis, no tingling and no weakness. She has tried nothing for the symptoms.    Past Medical History  Diagnosis Date  . COPD (chronic obstructive pulmonary disease)   . Anxiety   . Panic attacks   . Back pain   . Pulmonary embolism   . Chronic back pain   . DDD (degenerative disc disease)     Past Surgical History  Procedure Date  . Cesarean section   . Nose surgery   . Tubes tied     Family History  Problem Relation Age of Onset  . Arthritis    . Lung disease    . Cancer    . Asthma      History  Substance Use Topics  . Smoking status: Current Every Day Smoker -- 0.5 packs/day    Types: Cigarettes  . Smokeless tobacco: Not on file  . Alcohol Use: No    OB History    Grav Para Term Preterm Abortions TAB SAB Ect Mult Living                  Review of Systems  Constitutional: Negative for fever  and chills.  Gastrointestinal: Negative for bowel incontinence.  Genitourinary: Negative for bladder incontinence, dysuria and pelvic pain.  Musculoskeletal: Positive for back pain.  Neurological: Negative for tingling, weakness, numbness and paresthesias.  All other systems reviewed and are negative.    Allergies  Doxycycline and Tramadol  Home Medications   Current Outpatient Rx  Name Route Sig Dispense Refill  . ALPRAZOLAM 0.5 MG PO TABS Oral Take 0.5 mg by mouth 3 (three) times daily as needed. For anxiety     . ASPIRIN EC 81 MG PO TBEC Oral Take 81 mg by mouth daily.    Marland Kitchen FLUOXETINE HCL 20 MG PO CAPS Oral Take 20 mg by mouth daily.    . IBUPROFEN 200 MG PO TABS Oral Take 200 mg by mouth every 6 (six) hours as needed. Pain.    Marland Kitchen METHOCARBAMOL 500 MG PO TABS Oral Take 500 mg by mouth 3 (three) times daily as needed. spasm/pain    . NAPROXEN 500 MG PO TABS Oral Take 500 mg by mouth 2 (two) times daily as needed. Inflammation.    Marland Kitchen ROPINIROLE HCL 2 MG PO TABS Oral Take 2 mg by mouth at bedtime.      . TOPIRAMATE  100 MG PO TABS Oral Take 100 mg by mouth daily.      Marland Kitchen HYDROCODONE-ACETAMINOPHEN 5-325 MG PO TABS Oral Take 1 tablet by mouth every 6 (six) hours as needed for pain. 20 tablet 0    BP 113/68  Pulse 79  Temp 98.3 F (36.8 C) (Oral)  Resp 18  Ht 5\' 1"  (1.549 m)  Wt 244 lb (110.678 kg)  BMI 46.10 kg/m2  SpO2 98%  LMP 01/01/2012  Physical Exam  Nursing note and vitals reviewed. Constitutional: She is oriented to person, place, and time. She appears well-developed and well-nourished. No distress.  HENT:  Head: Normocephalic and atraumatic.  Eyes: EOM are normal.  Neck: Normal range of motion.  Cardiovascular: Normal rate and regular rhythm.   Pulmonary/Chest: Effort normal.  Abdominal: Soft. She exhibits no distension. There is no tenderness.  Musculoskeletal: She exhibits tenderness.       Lumbar back: She exhibits decreased range of motion, tenderness and pain.  She exhibits no bony tenderness, no swelling, no edema, no deformity, no laceration, no spasm and normal pulse.       Back:  Neurological: She is alert and oriented to person, place, and time. Coordination normal.  Skin: Skin is warm and dry.  Psychiatric: She has a normal mood and affect. Judgment normal.    ED Course  Procedures (including critical care time)  Labs Reviewed - No data to display No results found.   1. Acute exacerbation of chronic low back pain       MDM  rx-hydrocodone, 20         Evalina Field, [place] 02/08/12 1518

## 2012-02-08 NOTE — ED Provider Notes (Signed)
Medical screening examination/treatment/procedure(s) were performed by non-physician practitioner and as supervising physician I was immediately available for consultation/collaboration.  Meeah Totino, MD 02/08/12 1528 

## 2012-02-08 NOTE — ED Notes (Signed)
Low back pain and pain lt knee.

## 2012-05-19 ENCOUNTER — Encounter (HOSPITAL_COMMUNITY): Payer: Self-pay | Admitting: *Deleted

## 2012-05-19 ENCOUNTER — Emergency Department (HOSPITAL_COMMUNITY)
Admission: EM | Admit: 2012-05-19 | Discharge: 2012-05-19 | Disposition: A | Discharge status: Home / Self Care | Payer: Medicare Other | Attending: Emergency Medicine | Admitting: Emergency Medicine

## 2012-05-19 DIAGNOSIS — M549 Dorsalgia, unspecified: Secondary | ICD-10-CM | POA: Insufficient documentation

## 2012-05-19 DIAGNOSIS — F17200 Nicotine dependence, unspecified, uncomplicated: Secondary | ICD-10-CM | POA: Insufficient documentation

## 2012-05-19 DIAGNOSIS — M25569 Pain in unspecified knee: Secondary | ICD-10-CM | POA: Insufficient documentation

## 2012-05-19 DIAGNOSIS — G8929 Other chronic pain: Secondary | ICD-10-CM | POA: Insufficient documentation

## 2012-05-19 DIAGNOSIS — M25562 Pain in left knee: Secondary | ICD-10-CM

## 2012-05-19 DIAGNOSIS — Z7982 Long term (current) use of aspirin: Secondary | ICD-10-CM | POA: Insufficient documentation

## 2012-05-19 DIAGNOSIS — IMO0002 Reserved for concepts with insufficient information to code with codable children: Secondary | ICD-10-CM | POA: Insufficient documentation

## 2012-05-19 DIAGNOSIS — M25561 Pain in right knee: Secondary | ICD-10-CM

## 2012-05-19 DIAGNOSIS — Z86711 Personal history of pulmonary embolism: Secondary | ICD-10-CM | POA: Insufficient documentation

## 2012-05-19 DIAGNOSIS — J449 Chronic obstructive pulmonary disease, unspecified: Secondary | ICD-10-CM | POA: Insufficient documentation

## 2012-05-19 DIAGNOSIS — Z79899 Other long term (current) drug therapy: Secondary | ICD-10-CM | POA: Insufficient documentation

## 2012-05-19 DIAGNOSIS — M1710 Unilateral primary osteoarthritis, unspecified knee: Secondary | ICD-10-CM

## 2012-05-19 DIAGNOSIS — F411 Generalized anxiety disorder: Secondary | ICD-10-CM | POA: Insufficient documentation

## 2012-05-19 DIAGNOSIS — J4489 Other specified chronic obstructive pulmonary disease: Secondary | ICD-10-CM | POA: Insufficient documentation

## 2012-05-19 DIAGNOSIS — F419 Anxiety disorder, unspecified: Secondary | ICD-10-CM

## 2012-05-19 MED ORDER — KETOROLAC TROMETHAMINE 10 MG PO TABS
10.0000 mg | ORAL_TABLET | Freq: Once | ORAL | Status: AC
  Administered 2012-05-19: 10 mg via ORAL
  Filled 2012-05-19: qty 1

## 2012-05-19 MED ORDER — MELOXICAM 7.5 MG PO TABS: ORAL_TABLET | ORAL | Status: DC

## 2012-05-19 MED ORDER — HYDROCODONE-ACETAMINOPHEN 5-325 MG PO TABS
1.0000 | ORAL_TABLET | Freq: Once | ORAL | Status: AC
  Administered 2012-05-19: 1 via ORAL
  Filled 2012-05-19: qty 1

## 2012-05-19 MED ORDER — DIAZEPAM 5 MG PO TABS
5.0000 mg | ORAL_TABLET | Freq: Once | ORAL | Status: AC
  Administered 2012-05-19: 5 mg via ORAL
  Filled 2012-05-19: qty 1

## 2012-05-19 MED ORDER — HYDROCODONE-ACETAMINOPHEN 5-325 MG PO TABS: ORAL_TABLET | ORAL | Status: DC

## 2012-05-19 NOTE — ED Notes (Signed)
Bil knee pain for "months" and worse last 3 days.

## 2012-05-19 NOTE — ED Notes (Signed)
EDPa in to see pt for initial assessment. 
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
## 2012-05-21 NOTE — ED Provider Notes (Signed)
Medical screening examination/treatment/procedure(s) were performed by non-physician practitioner and as supervising physician I was immediately available for consultation/collaboration.   Dione Booze, MD 05/21/12 [phone]

## 2012-05-21 NOTE — ED Provider Notes (Signed)
Medical screening examination/treatment/procedure(s) were performed by non-physician practitioner and as supervising physician I was immediately available for consultation/collaboration.   Kiwan Gadsden, MD 05/21/12 2245 

## 2012-06-06 ENCOUNTER — Ambulatory Visit (INDEPENDENT_AMBULATORY_CARE_PROVIDER_SITE_OTHER): Payer: Medicare Other | Admitting: Orthopedic Surgery

## 2012-06-06 ENCOUNTER — Encounter: Payer: Self-pay | Admitting: Orthopedic Surgery

## 2012-06-06 ENCOUNTER — Ambulatory Visit (INDEPENDENT_AMBULATORY_CARE_PROVIDER_SITE_OTHER): Payer: Medicare Other

## 2012-06-06 ENCOUNTER — Telehealth (HOSPITAL_COMMUNITY): Payer: Self-pay | Admitting: Dietician

## 2012-06-06 VITALS — BP 118/78 | Ht 61.0 in | Wt 253.0 lb

## 2012-06-06 DIAGNOSIS — M25569 Pain in unspecified knee: Secondary | ICD-10-CM

## 2012-06-06 DIAGNOSIS — M25562 Pain in left knee: Secondary | ICD-10-CM

## 2012-06-06 DIAGNOSIS — M222X9 Patellofemoral disorders, unspecified knee: Secondary | ICD-10-CM | POA: Insufficient documentation

## 2012-06-06 DIAGNOSIS — E669 Obesity, unspecified: Secondary | ICD-10-CM | POA: Insufficient documentation

## 2012-06-06 MED ORDER — MELOXICAM 7.5 MG PO TABS: ORAL_TABLET | ORAL | Status: DC

## 2012-06-06 NOTE — Progress Notes (Signed)
Patient ID: [name], female   DOB: 1970/11/03, 42 y.o.   MRN: [id number] Chief Complaint  Patient presents with  . Knee Pain    left knee pain, redness, and swelling    History This is a 42 year old female with a 2-3 year history of bilateral "very bad" knee pain. Previous treatment hydrocodone, knee sleeve, topical muscle/arthritis cream.  She has a history of back pain with multiple epidural injections with some relief. She complains of sharp dull throbbing 8/10 constant pain associated with locking catching and swelling. She says nothing makes it better but motion in bad weather makes it worse. She says it hurts all the time. She says both knees will suddenly give way.  She reports denies any previous trauma.  The review of systems reveals the following positive findings as reported by the patient shortness of breath and cough, diarrhea, anxiety and depression. Musculoskeletal complaints of joint pain joint swelling, instability and stiffness. Redness, heat and muscle pain.  All other systems were negative  The past history family history and social history have been reviewed in Epic  The examination reveals followed BP 118/78  Ht 5\' 1"  (1.549 m)  Wt 253 lb (114.76 kg)  BMI 47.83 kg/m2 The patient has a BMI of greater than 47 and is obese. Otherwise grooming and hygiene are normal. She is oriented x3. Her mood and affect are normal. Gait and station are normal.  Extremities show no contracture subluxation atrophy or tremor and skin is normal.  Her lower extremities have normal pulses and temperature without edema or swelling. Normal sensation. No pathologic reflexes and normal balance.  Right knee full passive and active range of motion parapatellar tenderness normal ligaments normal strength normal skin  Left knee no swelling no joint effusion parapatellar tenderness full range of motion no instability normal strength normal skin  Both knees and normal McMurray's  test.   PFS (patellofemoral syndrome), unspecified laterality - Plan: meloxicam (MOBIC) 7.5 MG tablet  Knee pain, left - Plan: DG Knee AP/LAT W/[place] Left, Ambulatory referral to Physical Therapy, meloxicam (MOBIC) 7.5 MG tablet  Obesity, unspecified   The patient is certainly not a candidate for surgery. She needs further nonoperative care including physical therapy and to take oral anti-inflammatories. I like to give her 2 injections. No followup needed. She needs to lose approximately down to a BMI of 25 but reasonable goal would be 35.

## 2012-06-06 NOTE — Telephone Encounter (Signed)
Received referral via fax from Dr. Romeo Apple for dx: obesity.

## 2012-06-06 NOTE — Patient Instructions (Addendum)
You have received a steroid shot. 15% of patients experience increased pain at the injection site with in the next 24 hours. This is best treated with ice and tylenol extra strength 2 tabs every 8 hours. If you are still having pain please call the office.   Start physical therapy   Go to nutrition consult   See your doctor for pain medication

## 2012-06-06 NOTE — Telephone Encounter (Signed)
Called pt at 1151. She refused to make an appointment at this time. Encouraged pt to call back when she is ready. Will keep referral on file.

## 2012-06-07 ENCOUNTER — Telehealth: Payer: Self-pay | Admitting: Orthopedic Surgery

## 2012-06-07 NOTE — Telephone Encounter (Signed)
Advised  patient of doctor's reply °

## 2012-06-07 NOTE — Telephone Encounter (Signed)
Take some over the counter ibuprofen it will go away in 24 hours

## 2012-06-07 NOTE — Telephone Encounter (Signed)
[name] got injections in both knees yesterday.  Says she is icing and taking 2 X-strength Tylenol every 3-4 hours, but still hurting. Please advise  [name] # [phone]

## 2012-06-14 ENCOUNTER — Telehealth: Payer: Self-pay | Admitting: *Deleted

## 2012-06-14 NOTE — Telephone Encounter (Signed)
Patient called c/o still having bilateral knee pain. States pain is in calf of both legs. Advised patient to continue medications that were prescribed and take Ibuprofen or Tylenol q4hrs as needed per Dr. Romeo Apple.

## 2012-06-15 ENCOUNTER — Encounter (HOSPITAL_COMMUNITY): Payer: Self-pay | Admitting: *Deleted

## 2012-06-15 ENCOUNTER — Emergency Department (HOSPITAL_COMMUNITY)
Admission: EM | Admit: 2012-06-15 | Discharge: 2012-06-15 | Disposition: A | Discharge status: Home / Self Care | Payer: Medicare Other | Attending: Emergency Medicine | Admitting: Emergency Medicine

## 2012-06-15 DIAGNOSIS — J449 Chronic obstructive pulmonary disease, unspecified: Secondary | ICD-10-CM | POA: Insufficient documentation

## 2012-06-15 DIAGNOSIS — Z79899 Other long term (current) drug therapy: Secondary | ICD-10-CM | POA: Insufficient documentation

## 2012-06-15 DIAGNOSIS — J4489 Other specified chronic obstructive pulmonary disease: Secondary | ICD-10-CM | POA: Insufficient documentation

## 2012-06-15 DIAGNOSIS — F410 Panic disorder [episodic paroxysmal anxiety] without agoraphobia: Secondary | ICD-10-CM | POA: Insufficient documentation

## 2012-06-15 DIAGNOSIS — F17200 Nicotine dependence, unspecified, uncomplicated: Secondary | ICD-10-CM | POA: Insufficient documentation

## 2012-06-15 DIAGNOSIS — M25561 Pain in right knee: Secondary | ICD-10-CM

## 2012-06-15 DIAGNOSIS — Z7982 Long term (current) use of aspirin: Secondary | ICD-10-CM | POA: Insufficient documentation

## 2012-06-15 DIAGNOSIS — M25569 Pain in unspecified knee: Secondary | ICD-10-CM | POA: Insufficient documentation

## 2012-06-15 DIAGNOSIS — Z8739 Personal history of other diseases of the musculoskeletal system and connective tissue: Secondary | ICD-10-CM | POA: Insufficient documentation

## 2012-06-15 DIAGNOSIS — Z86711 Personal history of pulmonary embolism: Secondary | ICD-10-CM | POA: Insufficient documentation

## 2012-06-15 MED ORDER — PREDNISONE 10 MG PO TABS: 20.0000 mg | ORAL_TABLET | Freq: Every day | ORAL | Status: DC

## 2012-06-15 MED ORDER — HYDROCODONE-ACETAMINOPHEN 5-325 MG PO TABS: 1.0000 | ORAL_TABLET | ORAL | Status: DC | PRN

## 2012-06-15 NOTE — ED Notes (Signed)
Increased bil leg pain x 2 months.  Difficulty ambulating.  Cortisone injections to bil legs on 06/06/12.

## 2012-06-15 NOTE — ED Provider Notes (Signed)
History     CSN: [id number]  Arrival date & time 06/15/12  1336   First MD Initiated Contact with Patient 06/15/12 1407      Chief Complaint  Patient presents with  . Leg Pain    (Consider location/radiation/quality/duration/timing/severity/associated sxs/prior treatment) HPI  [name] is a 42 y.o. female who presents to the ED with leg pain. The pain is located in both knees. The pain started 2 months ago. She was evaluated in the ED 06/06/12 and Dr. Romeo Apple gave her cortisone injections. She called the office on 06/07/12 to report that the pain had continued and was instructed by Dr. Romeo Apple to take OTC ibuprofen and tylenol. She called the office back today and Dr. Romeo Apple said to continue ibuprofen. She comes to the ED due to the pain.  She complains of difficulty walking due to pain. States that some nights her legs swell. The history was provided by the patient and her medical record.   Past Medical History  Diagnosis Date  . COPD (chronic obstructive pulmonary disease)   . Anxiety   . Panic attacks   . Back pain   . Pulmonary embolism   . Chronic back pain   . DDD (degenerative disc disease)     Past Surgical History  Procedure Laterality Date  . Cesarean section    . Nose surgery    . Tubes tied      Family History  Problem Relation Age of Onset  . Arthritis    . Lung disease    . Cancer    . Asthma      History  Substance Use Topics  . Smoking status: Current Every Day Smoker -- 0.50 packs/day    Types: Cigarettes  . Smokeless tobacco: Not on file  . Alcohol Use: No    OB History   Grav Para Term Preterm Abortions TAB SAB Ect Mult Living                  Review of Systems  Constitutional: Negative for fever and chills.  HENT: Negative for congestion and neck pain.   Eyes: Negative for pain.  Respiratory: Negative for cough and wheezing.   Cardiovascular: Negative for chest pain and palpitations.  Gastrointestinal: Negative for nausea,  vomiting and abdominal pain.  Musculoskeletal: Negative for back pain.       Bilateral knee pain.  Skin: Negative for rash and wound.  Allergic/Immunologic: Negative for immunocompromised state.  Neurological: Negative for light-headedness and headaches.    Allergies  Doxycycline and Tramadol  Home Medications   Current Outpatient Rx  Name  Route  Sig  Dispense  Refill  . acetaminophen (TYLENOL) 325 MG tablet   Oral   Take 650 mg by mouth every 6 (six) hours as needed for pain.         Marland Kitchen ALPRAZolam (XANAX) 0.5 MG tablet   Oral   Take 0.5 mg by mouth 3 (three) times daily as needed. For anxiety          . aspirin EC 81 MG tablet   Oral   Take 81 mg by mouth daily.         Marland Kitchen FLUoxetine (PROZAC) 20 MG capsule   Oral   Take 20 mg by mouth daily.         Marland Kitchen HYDROcodone-acetaminophen (NORCO/VICODIN) 5-325 MG per tablet      1 or 2 po q4h prn pain   20 tablet   0   .  ibuprofen (ADVIL,MOTRIN) 200 MG tablet   Oral   Take 200 mg by mouth every 6 (six) hours as needed. Pain.         . meloxicam (MOBIC) 7.5 MG tablet      1 po bid with food   60 tablet   1   . methocarbamol (ROBAXIN) 500 MG tablet   Oral   Take 500 mg by mouth 3 (three) times daily as needed. spasm/pain         . naproxen (NAPROSYN) 500 MG tablet   Oral   Take 500 mg by mouth 2 (two) times daily as needed. Inflammation.         Marland Kitchen rOPINIRole (REQUIP) 2 MG tablet   Oral   Take 2 mg by mouth at bedtime.           . topiramate (TOPAMAX) 100 MG tablet   Oral   Take 100 mg by mouth daily.             BP 117/86  Pulse 83  Temp(Src) 98.1 F (36.7 C) (Oral)  Resp 18  Ht 5\' 1"  (1.549 m)  Wt 253 lb (114.76 kg)  BMI 47.83 kg/m2  SpO2 99%  Physical Exam  Nursing note and vitals reviewed. Constitutional: She is oriented to person, place, and time. No distress.  Obese W/F  HENT:  Head: Normocephalic and atraumatic.  Eyes: EOM are normal. Pupils are equal, round, and reactive to  light.  Neck: Neck supple.  Cardiovascular: Normal rate.   Pulmonary/Chest: Effort normal. No respiratory distress.  Abdominal: Soft. There is no tenderness.  Musculoskeletal:  Bilateral knee tenderness on exam. Pain increases with weight bearing. Pedal pulses present, adequate circulation. Good touch sensation.  Neurological: She is alert and oriented to person, place, and time. No cranial nerve deficit.  Patient ambulatory   Skin: Skin is warm and dry.  Psychiatric: She has a normal mood and affect. Her behavior is normal. Judgment and thought content normal.    ED Course  Procedures  Patient up and ambulatory to the bathroom.   MDM: dr. Adriana Simas in to examine the patient and discuss plan of care. Will treat pain today and patient is to follow up with Dr.Harrison.   Assessment: 42 y.o. female with bilateral knee pain   Obesity  Plan:  Pain management and follow up with Dr. Romeo Apple Discussed with the patient and all questioned fully answered. She will return if any problems arise.    Medication List    TAKE these medications       HYDROcodone-acetaminophen 5-325 MG per tablet  Commonly known as:  NORCO/VICODIN  Take 1 tablet by mouth every 4 (four) hours as needed for pain.     predniSONE 10 MG tablet  Commonly known as:  DELTASONE  Take 2 tablets (20 mg total) by mouth daily.      ASK your doctor about these medications       acetaminophen 325 MG tablet  Commonly known as:  TYLENOL  Take 650 mg by mouth every 6 (six) hours as needed for pain.     albuterol 108 (90 BASE) MCG/ACT inhaler  Commonly known as:  PROVENTIL HFA;VENTOLIN HFA  Inhale 2 puffs into the lungs every 6 (six) hours as needed for wheezing (COPD).     ALPRAZolam 0.5 MG tablet  Commonly known as:  XANAX  Take 0.5 mg by mouth 3 (three) times daily as needed. For anxiety     aspirin EC 81 MG  tablet  Take 81 mg by mouth daily.     FLUoxetine 20 MG capsule  Commonly known as:  PROZAC  Take 20 mg by  mouth daily.     ibuprofen 200 MG tablet  Commonly known as:  ADVIL,MOTRIN  Take 200 mg by mouth every 6 (six) hours as needed. Pain.     methocarbamol 500 MG tablet  Commonly known as:  ROBAXIN  Take 500 mg by mouth 3 (three) times daily as needed. spasm/pain     rOPINIRole 2 MG tablet  Commonly known as:  REQUIP  Take 2 mg by mouth at bedtime.     topiramate 100 MG tablet  Commonly known as:  TOPAMAX  Take 100 mg by mouth daily.              Janne Napoleon, [place] 06/15/12 1511

## 2012-06-15 NOTE — ED Notes (Signed)
Bil knee pain for 6 mos, Had cortisone shots to knees without relief, Good dp pulses bil.  No redness or swelling seen. On awakening pt has pain in calfs, then that gets better.

## 2012-06-16 NOTE — ED Provider Notes (Signed)
Medical screening examination/treatment/procedure(s) were conducted as a shared visit with non-physician practitioner(s) and myself.  I personally evaluated the patient during the encounter.  Chronic bilateral knee pain. No new findings. follow up with orthopedic  Donnetta Hutching, MD 06/16/12 1028

## 2012-06-20 ENCOUNTER — Telehealth: Payer: Self-pay | Admitting: Orthopedic Surgery

## 2012-06-20 ENCOUNTER — Ambulatory Visit (HOSPITAL_COMMUNITY): Payer: Medicare Other | Admitting: Physical Therapy

## 2012-06-20 NOTE — Telephone Encounter (Signed)
No see pcp

## 2012-06-20 NOTE — Telephone Encounter (Signed)
[name] called this morning, said she went to [facility] ER for her knees 06/15/12.  She is asking for a referral to a pain clinic.  Told her she should ask her PCP for the referral, but she said the ER doctor told her you could do it.

## 2012-06-20 NOTE — Telephone Encounter (Signed)
Advised  patient of doctor's reply °

## 2012-07-24 ENCOUNTER — Encounter (HOSPITAL_COMMUNITY): Payer: Self-pay | Admitting: *Deleted

## 2012-07-24 ENCOUNTER — Emergency Department (HOSPITAL_COMMUNITY)
Admission: EM | Admit: 2012-07-24 | Discharge: 2012-07-24 | Disposition: A | Discharge status: Home / Self Care | Payer: Medicare Other | Attending: Emergency Medicine | Admitting: Emergency Medicine

## 2012-07-24 DIAGNOSIS — F17200 Nicotine dependence, unspecified, uncomplicated: Secondary | ICD-10-CM | POA: Insufficient documentation

## 2012-07-24 DIAGNOSIS — Z86711 Personal history of pulmonary embolism: Secondary | ICD-10-CM | POA: Insufficient documentation

## 2012-07-24 DIAGNOSIS — M25569 Pain in unspecified knee: Secondary | ICD-10-CM | POA: Insufficient documentation

## 2012-07-24 DIAGNOSIS — G8929 Other chronic pain: Secondary | ICD-10-CM | POA: Insufficient documentation

## 2012-07-24 DIAGNOSIS — F410 Panic disorder [episodic paroxysmal anxiety] without agoraphobia: Secondary | ICD-10-CM | POA: Insufficient documentation

## 2012-07-24 DIAGNOSIS — J4489 Other specified chronic obstructive pulmonary disease: Secondary | ICD-10-CM | POA: Insufficient documentation

## 2012-07-24 DIAGNOSIS — Z8739 Personal history of other diseases of the musculoskeletal system and connective tissue: Secondary | ICD-10-CM | POA: Insufficient documentation

## 2012-07-24 DIAGNOSIS — J449 Chronic obstructive pulmonary disease, unspecified: Secondary | ICD-10-CM | POA: Insufficient documentation

## 2012-07-24 DIAGNOSIS — Z79899 Other long term (current) drug therapy: Secondary | ICD-10-CM | POA: Insufficient documentation

## 2012-07-24 MED ORDER — NAPROXEN 500 MG PO TABS: 500.0000 mg | ORAL_TABLET | Freq: Two times a day (BID) | ORAL | Status: DC

## 2012-07-24 MED ORDER — HYDROCODONE-ACETAMINOPHEN 5-325 MG PO TABS
2.0000 | ORAL_TABLET | Freq: Once | ORAL | Status: AC
  Administered 2012-07-24: 2 via ORAL
  Filled 2012-07-24: qty 2

## 2012-07-24 MED ORDER — NAPROXEN 250 MG PO TABS
500.0000 mg | ORAL_TABLET | Freq: Once | ORAL | Status: AC
  Administered 2012-07-24: 500 mg via ORAL
  Filled 2012-07-24: qty 2

## 2012-07-24 MED ORDER — HYDROCODONE-ACETAMINOPHEN 5-325 MG PO TABS: 1.0000 | ORAL_TABLET | Freq: Four times a day (QID) | ORAL | Status: DC | PRN

## 2012-07-24 NOTE — ED Notes (Addendum)
Bil knee pain, over 6 mos .  No injury  Says she has been to an ortho .  For this.  Pt is staying upstairs with her  Mother that is adm .

## 2012-07-24 NOTE — ED Provider Notes (Signed)
Medical screening examination/treatment/procedure(s) were performed by non-physician practitioner and as supervising physician I was immediately available for consultation/collaboration.  Donny Heffern, MD 07/24/12 2139 

## 2012-07-24 NOTE — ED Notes (Signed)
nad noted prior to dc. Dc instructions reviewed and rx given to pt (2) . Ambulated out without difficulty.

## 2012-07-24 NOTE — ED Provider Notes (Signed)
History     CSN: [id number]  Arrival date & time 07/24/12  1639   First MD Initiated Contact with Patient 07/24/12 1653      Chief Complaint  Patient presents with  . Knee Pain    (Consider location/radiation/quality/duration/timing/severity/associated sxs/prior treatment) HPI Comments: [name] is a 42 y.o. Female presenting with chronic bilateral knee pain which is from known arthritis in her knees.  She is currently staying at this facility with her mother who is an admitted patient and states the cold airconditioning in her room is causing her knees to ache.  She has tried various medications and treatment in the past including steroid injections by Dr. Romeo Apple 2 months ago which did not help relieve her pain.  She denies new injury,  Denies fevers, chills,  Knee swelling or redness.  She has taken tylenol and ibuprofen without relief of symptoms.     The history is provided by the patient.    Past Medical History  Diagnosis Date  . COPD (chronic obstructive pulmonary disease)   . Anxiety   . Panic attacks   . Back pain   . Pulmonary embolism   . Chronic back pain   . DDD (degenerative disc disease)     Past Surgical History  Procedure Laterality Date  . Cesarean section    . Nose surgery    . Tubes tied      Family History  Problem Relation Age of Onset  . Arthritis    . Lung disease    . Cancer    . Asthma      History  Substance Use Topics  . Smoking status: Current Every Day Smoker -- 0.50 packs/day    Types: Cigarettes  . Smokeless tobacco: Not on file  . Alcohol Use: No    OB History   Grav Para Term Preterm Abortions TAB SAB Ect Mult Living                  Review of Systems  Constitutional: Negative for fever.  HENT: Negative.   Eyes: Negative.   Respiratory: Negative for chest tightness and shortness of breath.   Cardiovascular: Negative for chest pain.  Gastrointestinal: Negative for nausea and abdominal pain.  Genitourinary:  Negative.   Musculoskeletal: Positive for arthralgias. Negative for myalgias and joint swelling.  Skin: Negative.  Negative for rash and wound.  Neurological: Negative for dizziness, weakness, light-headedness, numbness and headaches.  Psychiatric/Behavioral: Negative.     Allergies  Doxycycline and Tramadol  Home Medications   Current Outpatient Rx  Name  Route  Sig  Dispense  Refill  . acetaminophen (TYLENOL) 325 MG tablet   Oral   Take 650 mg by mouth every 6 (six) hours as needed for pain.         Marland Kitchen albuterol (PROVENTIL HFA;VENTOLIN HFA) 108 (90 BASE) MCG/ACT inhaler   Inhalation   Inhale 2 puffs into the lungs every 6 (six) hours as needed for wheezing (COPD).         Marland Kitchen ibuprofen (ADVIL,MOTRIN) 200 MG tablet   Oral   Take 200 mg by mouth every 6 (six) hours as needed. Pain.         Marland Kitchen HYDROcodone-acetaminophen (NORCO/VICODIN) 5-325 MG per tablet   Oral   Take 1-2 tablets by mouth every 6 (six) hours as needed for pain.   20 tablet   0   . naproxen (NAPROSYN) 500 MG tablet   Oral   Take 1 tablet (500  mg total) by mouth 2 (two) times daily.   20 tablet   0     BP 132/95  Pulse 84  Temp(Src) 97.5 F (36.4 C) (Oral)  Resp 20  Ht 5\' 1"  (1.549 m)  Wt 249 lb (112.946 kg)  BMI 47.07 kg/m2  SpO2 99%  LMP 07/17/2012  Physical Exam  Constitutional: She appears well-developed and well-nourished.  HENT:  Head: Atraumatic.  Neck: Normal range of motion.  Cardiovascular:  Pulses:      Popliteal pulses are 2+ on the right side, and 2+ on the left side.       Dorsalis pedis pulses are 2+ on the right side, and 2+ on the left side.  Pulses equal bilaterally  Musculoskeletal: She exhibits tenderness.  TTP bilateral knees with no edema or erythema.  There is bilateral mild crepitus with ROM.  There is no laxity with varus or valgus strain.  Negative drawer test.  Neurological: She is alert. She has normal strength. She displays normal reflexes. No sensory deficit.   Equal strength  Skin: Skin is warm and dry.  Psychiatric: She has a normal mood and affect.    ED Course  Procedures (including critical care time)  Labs Reviewed - No data to display No results found.   1. Bilateral chronic knee pain       MDM  Pt with chronic bilateral knee pain without injury. She was prescribed naproxen,  Hydrocodone.  Encouraged heat therapy.  F/u with Dr Romeo Apple prn.        Burgess Amor, PA-C 07/24/12 [phone]

## 2012-09-09 ENCOUNTER — Emergency Department (HOSPITAL_COMMUNITY)
Admission: EM | Admit: 2012-09-09 | Discharge: 2012-09-09 | Disposition: A | Discharge status: Home / Self Care | Payer: Medicare Other | Attending: Emergency Medicine | Admitting: Emergency Medicine

## 2012-09-09 ENCOUNTER — Encounter (HOSPITAL_COMMUNITY): Payer: Self-pay | Admitting: *Deleted

## 2012-09-09 DIAGNOSIS — S39012A Strain of muscle, fascia and tendon of lower back, initial encounter: Secondary | ICD-10-CM

## 2012-09-09 DIAGNOSIS — Z8659 Personal history of other mental and behavioral disorders: Secondary | ICD-10-CM | POA: Insufficient documentation

## 2012-09-09 DIAGNOSIS — J449 Chronic obstructive pulmonary disease, unspecified: Secondary | ICD-10-CM | POA: Insufficient documentation

## 2012-09-09 DIAGNOSIS — F17200 Nicotine dependence, unspecified, uncomplicated: Secondary | ICD-10-CM | POA: Insufficient documentation

## 2012-09-09 DIAGNOSIS — G8929 Other chronic pain: Secondary | ICD-10-CM | POA: Insufficient documentation

## 2012-09-09 DIAGNOSIS — Y929 Unspecified place or not applicable: Secondary | ICD-10-CM | POA: Insufficient documentation

## 2012-09-09 DIAGNOSIS — Z86711 Personal history of pulmonary embolism: Secondary | ICD-10-CM | POA: Insufficient documentation

## 2012-09-09 DIAGNOSIS — X500XXA Overexertion from strenuous movement or load, initial encounter: Secondary | ICD-10-CM | POA: Insufficient documentation

## 2012-09-09 DIAGNOSIS — Z79899 Other long term (current) drug therapy: Secondary | ICD-10-CM | POA: Insufficient documentation

## 2012-09-09 DIAGNOSIS — J4489 Other specified chronic obstructive pulmonary disease: Secondary | ICD-10-CM | POA: Insufficient documentation

## 2012-09-09 DIAGNOSIS — Y9389 Activity, other specified: Secondary | ICD-10-CM | POA: Insufficient documentation

## 2012-09-09 DIAGNOSIS — Z8739 Personal history of other diseases of the musculoskeletal system and connective tissue: Secondary | ICD-10-CM | POA: Insufficient documentation

## 2012-09-09 DIAGNOSIS — S335XXA Sprain of ligaments of lumbar spine, initial encounter: Secondary | ICD-10-CM | POA: Insufficient documentation

## 2012-09-09 MED ORDER — CYCLOBENZAPRINE HCL 10 MG PO TABS: 10.0000 mg | ORAL_TABLET | Freq: Two times a day (BID) | ORAL | Status: DC | PRN

## 2012-09-09 MED ORDER — HYDROCODONE-ACETAMINOPHEN 5-325 MG PO TABS: 1.0000 | ORAL_TABLET | ORAL | Status: DC | PRN

## 2012-09-09 MED ORDER — PREDNISONE 10 MG PO TABS: 20.0000 mg | ORAL_TABLET | Freq: Two times a day (BID) | ORAL | Status: DC

## 2012-09-09 NOTE — ED Notes (Signed)
Pt c/o lower back pain that began earlier today after lifting a heavy object. Pt has hx of back pain.

## 2012-09-09 NOTE — ED Notes (Signed)
Pt c/o lower back pain. States her bird bath fell over and she tried to pick it up.

## 2012-09-09 NOTE — ED Provider Notes (Signed)
History     CSN: [id number]  Arrival date & time 09/09/12  2006   First MD Initiated Contact with Patient 09/09/12 2118      Chief Complaint  Patient presents with  . Back Pain    (Consider location/radiation/quality/duration/timing/severity/associated sxs/prior treatment) HPI [name] is a 42 y.o. female who presents to the ED with low back pain. She states that she was mowing and hit the bird bath and knocked it over. She got off the mower and lifted the bird bath and felt a strain in her lower back. She denies loss of control over bladder or bowels. The history was provided by the patient.  Past Medical History  Diagnosis Date  . COPD (chronic obstructive pulmonary disease)   . Anxiety   . Panic attacks   . Back pain   . Pulmonary embolism   . Chronic back pain   . DDD (degenerative disc disease)     Past Surgical History  Procedure Laterality Date  . Cesarean section    . Nose surgery    . Tubes tied      Family History  Problem Relation Age of Onset  . Arthritis    . Lung disease    . Cancer    . Asthma      History  Substance Use Topics  . Smoking status: Current Every Day Smoker -- 0.50 packs/day    Types: Cigarettes  . Smokeless tobacco: Not on file  . Alcohol Use: No    OB History   Grav Para Term Preterm Abortions TAB SAB Ect Mult Living                  Review of Systems  Constitutional: Negative for fever and activity change.  HENT: Negative for neck pain.   Respiratory: Negative for shortness of breath.   Cardiovascular: Negative for chest pain.  Gastrointestinal: Negative for nausea, vomiting and abdominal pain.  Genitourinary: Negative for urgency and frequency.  Musculoskeletal: Positive for back pain.  Skin: Negative for wound.  Neurological: Negative for headaches.  Psychiatric/Behavioral: The patient is not nervous/anxious.     Allergies  Doxycycline; Pyridium; and Tramadol  Home Medications   Current Outpatient Rx    Name  Route  Sig  Dispense  Refill  . acetaminophen (TYLENOL) 325 MG tablet   Oral   Take 650 mg by mouth every 6 (six) hours as needed for pain.         Marland Kitchen albuterol (PROVENTIL HFA;VENTOLIN HFA) 108 (90 BASE) MCG/ACT inhaler   Inhalation   Inhale 2 puffs into the lungs every 6 (six) hours as needed for wheezing (COPD).         Marland Kitchen ibuprofen (ADVIL,MOTRIN) 200 MG tablet   Oral   Take 200 mg by mouth every 6 (six) hours as needed. Pain.           BP 138/75  Pulse 76  Temp(Src) 98.9 F (37.2 C) (Oral)  Resp 20  Ht 5\' 1"  (1.549 m)  Wt 250 lb (113.399 kg)  BMI 47.26 kg/m2  SpO2 100%  LMP 09/03/2012  Physical Exam  Nursing note and vitals reviewed. Constitutional: She is oriented to person, place, and time. She appears well-developed and well-nourished. No distress.  HENT:  Head: Normocephalic and atraumatic.  Eyes: EOM are normal.  Neck: Normal range of motion. Neck supple.  Cardiovascular: Normal rate and regular rhythm.   Pulmonary/Chest: Effort normal and breath sounds normal.  Abdominal: Soft. Bowel sounds are  normal. There is no tenderness.  Musculoskeletal:       Lumbar back: She exhibits decreased range of motion, tenderness and spasm. She exhibits normal pulse.       Back:  Neurological: She is alert and oriented to person, place, and time. She has normal strength and normal reflexes. No cranial nerve deficit or sensory deficit. Coordination and gait normal.  Pedal pulses strong and equal bilateral. Adequate circulation. Good touch sensation.   Skin: Skin is warm and dry.  Psychiatric: She has a normal mood and affect.    ED Course  Procedures (including critical care time)  MDM  42 y.o. female with lumbar strain. Will treat pain and inflammation and she will follow up with her PCP. She will return here for any problems.    Medication List    TAKE these medications       cyclobenzaprine 10 MG tablet  Commonly known as:  FLEXERIL  Take 1 tablet (10 mg  total) by mouth 2 (two) times daily as needed for muscle spasms.     HYDROcodone-acetaminophen 5-325 MG per tablet  Commonly known as:  NORCO/VICODIN  Take 1 tablet by mouth every 4 (four) hours as needed.     predniSONE 10 MG tablet  Commonly known as:  DELTASONE  Take 2 tablets (20 mg total) by mouth 2 (two) times daily.      ASK your doctor about these medications       acetaminophen 325 MG tablet  Commonly known as:  TYLENOL  Take 650 mg by mouth every 6 (six) hours as needed for pain.     albuterol 108 (90 BASE) MCG/ACT inhaler  Commonly known as:  PROVENTIL HFA;VENTOLIN HFA  Inhale 2 puffs into the lungs every 6 (six) hours as needed for wheezing (COPD).     ibuprofen 200 MG tablet  Commonly known as:  ADVIL,MOTRIN  Take 200 mg by mouth every 6 (six) hours as needed. Pain.               Janne Napoleon, [place] 09/09/12 2236

## 2012-09-09 NOTE — ED Provider Notes (Signed)
Medical screening examination/treatment/procedure(s) were performed by non-physician practitioner and as supervising physician I was immediately available for consultation/collaboration.   Dione Booze, MD 09/09/12 [phone]

## 2012-09-29 ENCOUNTER — Emergency Department (HOSPITAL_COMMUNITY)
Admission: EM | Admit: 2012-09-29 | Discharge: 2012-09-29 | Disposition: A | Discharge status: Home / Self Care | Payer: Medicare Other | Attending: Emergency Medicine | Admitting: Emergency Medicine

## 2012-09-29 ENCOUNTER — Emergency Department (HOSPITAL_COMMUNITY): Payer: Medicare Other

## 2012-09-29 ENCOUNTER — Encounter (HOSPITAL_COMMUNITY): Payer: Self-pay

## 2012-09-29 DIAGNOSIS — J4489 Other specified chronic obstructive pulmonary disease: Secondary | ICD-10-CM | POA: Insufficient documentation

## 2012-09-29 DIAGNOSIS — IMO0002 Reserved for concepts with insufficient information to code with codable children: Secondary | ICD-10-CM | POA: Insufficient documentation

## 2012-09-29 DIAGNOSIS — Z86711 Personal history of pulmonary embolism: Secondary | ICD-10-CM | POA: Insufficient documentation

## 2012-09-29 DIAGNOSIS — Z8659 Personal history of other mental and behavioral disorders: Secondary | ICD-10-CM | POA: Insufficient documentation

## 2012-09-29 DIAGNOSIS — K089 Disorder of teeth and supporting structures, unspecified: Secondary | ICD-10-CM | POA: Insufficient documentation

## 2012-09-29 DIAGNOSIS — F17200 Nicotine dependence, unspecified, uncomplicated: Secondary | ICD-10-CM | POA: Insufficient documentation

## 2012-09-29 DIAGNOSIS — G8929 Other chronic pain: Secondary | ICD-10-CM | POA: Insufficient documentation

## 2012-09-29 DIAGNOSIS — R600 Localized edema: Secondary | ICD-10-CM

## 2012-09-29 DIAGNOSIS — Z79899 Other long term (current) drug therapy: Secondary | ICD-10-CM | POA: Insufficient documentation

## 2012-09-29 DIAGNOSIS — R609 Edema, unspecified: Secondary | ICD-10-CM | POA: Insufficient documentation

## 2012-09-29 DIAGNOSIS — M549 Dorsalgia, unspecified: Secondary | ICD-10-CM | POA: Insufficient documentation

## 2012-09-29 DIAGNOSIS — M79606 Pain in leg, unspecified: Secondary | ICD-10-CM

## 2012-09-29 DIAGNOSIS — J449 Chronic obstructive pulmonary disease, unspecified: Secondary | ICD-10-CM | POA: Insufficient documentation

## 2012-09-29 DIAGNOSIS — M79609 Pain in unspecified limb: Secondary | ICD-10-CM | POA: Insufficient documentation

## 2012-09-29 DIAGNOSIS — R0602 Shortness of breath: Secondary | ICD-10-CM | POA: Insufficient documentation

## 2012-09-29 LAB — TROPONIN I: Troponin I: <0.3 ng/mL (ref <0.30)

## 2012-09-29 LAB — CBC WITH DIFFERENTIAL/PLATELET
Basophils Absolute: 0 10*3/uL (ref 0.0–0.1)
Basophils Relative: 0 % (ref 0–1)
Eosinophils Absolute: 0.3 10*3/uL (ref 0.0–0.7)
Eosinophils Relative: 3 % (ref 0–5)
HCT: 33.7 % — ABNORMAL LOW (ref 36.0–46.0)
Hemoglobin: 11.6 g/dL — ABNORMAL LOW (ref 12.0–15.0)
Lymphocytes Relative: 46 % (ref 12–46)
Lymphs Abs: 4.5 10*3/uL — ABNORMAL HIGH (ref 0.7–4.0)
MCH: 30.1 pg (ref 26.0–34.0)
MCHC: 34.4 g/dL (ref 30.0–36.0)
MCV: 87.3 fL (ref 78.0–100.0)
Monocytes Absolute: 0.9 10*3/uL (ref 0.1–1.0)
Monocytes Relative: 10 % (ref 3–12)
Neutro Abs: 4 10*3/uL (ref 1.7–7.7)
Neutrophils Relative %: 41 % — ABNORMAL LOW (ref 43–77)
Platelets: 270 10*3/uL (ref 150–400)
RBC: 3.86 MIL/uL — ABNORMAL LOW (ref 3.87–5.11)
RDW: 15.1 % (ref 11.5–15.5)
WBC: 9.7 10*3/uL (ref 4.0–10.5)

## 2012-09-29 LAB — BASIC METABOLIC PANEL
BUN: 18 mg/dL (ref 6–23)
CO2: 22 mEq/L (ref 19–32)
Calcium: 8.6 mg/dL (ref 8.4–10.5)
Chloride: 103 mEq/L (ref 96–112)
Creatinine, Ser: 0.93 mg/dL (ref 0.50–1.10)
GFR calc Af Amer: 88 mL/min — ABNORMAL LOW (ref >90)
GFR calc non Af Amer: 76 mL/min — ABNORMAL LOW (ref >90)
Glucose, Bld: 110 mg/dL — ABNORMAL HIGH (ref 70–99)
Potassium: 3.8 mEq/L (ref 3.5–5.1)
Sodium: 135 mEq/L (ref 135–145)

## 2012-09-29 LAB — PRO B NATRIURETIC PEPTIDE: Pro B Natriuretic peptide (BNP): 45 pg/mL (ref 0–125)

## 2012-09-29 LAB — D-DIMER, QUANTITATIVE (NOT AT ARMC): D-Dimer, Quant: <0.27 ug/mL-FEU (ref 0.00–0.48)

## 2012-09-29 MED ORDER — IPRATROPIUM BROMIDE 0.02 % IN SOLN
0.5000 mg | RESPIRATORY_TRACT | Status: DC
  Administered 2012-09-29: 0.5 mg via RESPIRATORY_TRACT
  Filled 2012-09-29: qty 2.5

## 2012-09-29 MED ORDER — HYDROCODONE-ACETAMINOPHEN 5-325 MG PO TABS: 1.0000 | ORAL_TABLET | Freq: Four times a day (QID) | ORAL | Status: DC | PRN

## 2012-09-29 MED ORDER — FUROSEMIDE 20 MG PO TABS: 20.0000 mg | ORAL_TABLET | Freq: Two times a day (BID) | ORAL | Status: DC

## 2012-09-29 MED ORDER — ALBUTEROL SULFATE (5 MG/ML) 0.5% IN NEBU
5.0000 mg | INHALATION_SOLUTION | RESPIRATORY_TRACT | Status: DC
  Administered 2012-09-29: 5 mg via RESPIRATORY_TRACT
  Filled 2012-09-29: qty 1

## 2012-09-29 MED ORDER — FUROSEMIDE 40 MG PO TABS
40.0000 mg | ORAL_TABLET | Freq: Once | ORAL | Status: AC
  Administered 2012-09-29: 40 mg via ORAL
  Filled 2012-09-29: qty 1

## 2012-09-29 MED ORDER — HYDROCODONE-ACETAMINOPHEN 5-325 MG PO TABS
2.0000 | ORAL_TABLET | Freq: Once | ORAL | Status: AC
  Administered 2012-09-29: 2 via ORAL
  Filled 2012-09-29: qty 2

## 2012-09-29 NOTE — ED Notes (Signed)
Patient resting in bed with eyes closed. NAD noted at this time. 

## 2012-09-29 NOTE — ED Provider Notes (Signed)
History     CSN: [id number]  Arrival date & time 09/29/12  0210   First MD Initiated Contact with Patient 09/29/12 0214      Chief Complaint  Patient presents with  . Leg Swelling  . Leg Pain   HPI [name] is a 42 y.o. female with a history of bilateral knee pain presents with bilateral lower extremity swelling. She says her legs have hurt and swollen for the last 5-6 months over pitting edema just started today. Today she did lots of walking which is unusual for her. She says there is an associated pain which is moderate to severe in her lower extremities, it feels like a toothache, dull, nonradiating, she took some ibuprofen without relief. Patient has a pertinent medical history of bilateral pulmonary emboli and even hospital a few years ago, for which she was completed a course of anticoagulant therapy. Patient denies any chest pain today. Patient does complain about some shortness of breath, she has a history of COPD and smokes one pack of cigarettes daily she has the last 20 years. Denies any hemoptysis.   Past Medical History  Diagnosis Date  . COPD (chronic obstructive pulmonary disease)   . Anxiety   . Panic attacks   . Back pain   . Pulmonary embolism   . Chronic back pain   . DDD (degenerative disc disease)     Past Surgical History  Procedure Laterality Date  . Cesarean section    . Nose surgery    . Tubes tied      Family History  Problem Relation Age of Onset  . Arthritis    . Lung disease    . Cancer    . Asthma      History  Substance Use Topics  . Smoking status: Current Every Day Smoker -- 0.50 packs/day    Types: Cigarettes  . Smokeless tobacco: Not on file  . Alcohol Use: No    OB History   Grav Para Term Preterm Abortions TAB SAB Ect Mult Living                  Review of Systems At least 10pt or greater review of systems completed and are negative except where specified in the HPI.  Allergies  Doxycycline; Pyridium; and  Tramadol  Home Medications   Current Outpatient Rx  Name  Route  Sig  Dispense  Refill  . acetaminophen (TYLENOL) 325 MG tablet   Oral   Take 650 mg by mouth every 6 (six) hours as needed for pain.         Marland Kitchen albuterol (PROVENTIL HFA;VENTOLIN HFA) 108 (90 BASE) MCG/ACT inhaler   Inhalation   Inhale 2 puffs into the lungs every 6 (six) hours as needed for wheezing (COPD).         Marland Kitchen ibuprofen (ADVIL,MOTRIN) 200 MG tablet   Oral   Take 200 mg by mouth every 6 (six) hours as needed. Pain.         . cyclobenzaprine (FLEXERIL) 10 MG tablet   Oral   Take 1 tablet (10 mg total) by mouth 2 (two) times daily as needed for muscle spasms.   20 tablet   0   . HYDROcodone-acetaminophen (NORCO/VICODIN) 5-325 MG per tablet   Oral   Take 1 tablet by mouth every 4 (four) hours as needed.   15 tablet   0   . predniSONE (DELTASONE) 10 MG tablet   Oral   Take 2 tablets (  20 mg total) by mouth 2 (two) times daily.   20 tablet   0     BP 112/64  Pulse 81  Temp(Src) 98.7 F (37.1 C) (Oral)  Ht 5' 1.5" (1.562 m)  Wt 256 lb (116.121 kg)  BMI 47.59 kg/m2  SpO2 97%  LMP 09/03/2012  Physical Exam  Nursing notes reviewed.  Electronic medical record reviewed. VITAL SIGNS:   Filed Vitals:   09/29/12 0208 09/29/12 0301  BP: 112/64   Pulse: 81   Temp: 98.7 F (37.1 C)   TempSrc: Oral   Height: 5' 1.5" (1.562 m)   Weight: 256 lb (116.121 kg)   SpO2: 97% 95%   CONSTITUTIONAL: Awake, oriented, appears non-toxic HENT: Atraumatic, normocephalic, oral mucosa pink and moist, airway patent. Nares patent without drainage. External ears normal. EYES: Conjunctiva clear, EOMI, PERRLA NECK: Trachea midline, non-tender, supple CARDIOVASCULAR: Normal heart rate, Normal rhythm, No murmurs, rubs, gallops PULMONARY/CHEST: Diffuse wheezing throughout, no rhonchi or rales. Symmetrical breath sounds. Non-tender. ABDOMINAL: Non-distended, morbidly obese, soft, non-tender - no rebound or guarding.   BS normal. NEUROLOGIC: Non-focal, moving all four extremities, no gross sensory or motor deficits. EXTREMITIES: No clubbing, cyanosis. Bilateral 3+ lower extremity pitting edema SKIN: Warm, Dry, No erythema, No rash   ED Course  Procedures (including critical care time)  Date: 09/29/2012  Rate: 74  Rhythm: normal sinus rhythm  QRS Axis: normal  Intervals: normal  ST/T Wave abnormalities: normal  Conduction Disutrbances: none  Narrative Interpretation: unremarkable - normal sinus rhythm, no significant morphological change compared with prior EKG dated 10/09/10  Labs Reviewed  CBC WITH DIFFERENTIAL - Abnormal; Notable for the following:    RBC 3.86 (*)    Hemoglobin 11.6 (*)    HCT 33.7 (*)    Neutrophils Relative % 41 (*)    Lymphs Abs 4.5 (*)    All other components within normal limits  BASIC METABOLIC PANEL - Abnormal; Notable for the following:    Glucose, Bld 110 (*)    GFR calc non Af Amer 76 (*)    GFR calc Af Amer 88 (*)    All other components within normal limits  TROPONIN I  PRO B NATRIURETIC PEPTIDE  D-DIMER, QUANTITATIVE   Dg Chest Port 1 View  09/29/2012   *RADIOLOGY REPORT*  Clinical Data: Leg swelling and shortness of breath.  PORTABLE CHEST - 1 VIEW  Comparison: Chest x-ray 07/26/2011.  Findings: Lung volumes are normal.  No consolidative airspace disease.  No pleural effusions.  No pneumothorax.  No pulmonary nodule or mass noted.  Pulmonary vasculature and the cardiomediastinal silhouette are within normal limits.  IMPRESSION: 1. No radiographic evidence of acute cardiopulmonary disease.   Original Report Authenticated By: Trudie Reed, M.D.     1. Bilateral lower extremity edema   2. Chronic leg pain, unspecified laterality       MDM  Patient presents with bilateral lower extremity pain and swelling. Patient has had a previous history of PE though I doubt bilateral DVTs at this time, based on her history of increased walking today and bilateral  lower extremity pain and swelling in the past, says likely a circulation issue. We'll obtain a d-dimer however to further risk stratify. Patient is also not complaining about any chest pain however she does have shortness of breath, she does smoke and has had PE in the past.  Obtain EKG, basic labs as therapy will be for some low-dose Lasix to help relief or edema.  Labs are essentially  unremarkable, BNP is negative, d-dimer is negative. Based on these results I do not think this is new onset heart failure, do not think her lower extremity bilateral edema is caused by DVT.  She has some shortness of breath I think this is likely caused by her COPD continuing to smoke, I doubt this is anginal equivalent, likewise I doubt ACS with a normal troponin long-standing symptoms and a normal EKG.  I think this patient's edema is likely secondary to walking, venous stasis and overall body habitus.   I explained the diagnosis and have given explicit precautions to return to the ER including worsening shortness of breath, chest pain or any other new or worsening symptoms. The patient understands and accepts the medical plan as it's been dictated and I have answered their questions. Discharge instructions concerning home care and prescriptions have been given.  The patient is STABLE and is discharged to home in good condition.       Jones Skene, MD 09/29/12 [phone]

## 2012-09-29 NOTE — ED Notes (Signed)
Patient complaining of pain and swelling in bi-lateral legs. C/o pain from her hips down, swelling in her lower legs.

## 2012-10-23 ENCOUNTER — Telehealth: Payer: Self-pay | Admitting: Orthopedic Surgery

## 2012-10-23 NOTE — Telephone Encounter (Signed)
There is not anything else I can offer her at this point she can get a second opinion or make an appointment with another orthopedist at her choice

## 2012-10-23 NOTE — Telephone Encounter (Signed)
[name] called to schedule an appointment for her knees.  Said the injections did not help at all and she is asking if there is anything else you can do to help the pain.  York Spaniel maybe you could refer her somewhere else if there is nothing else you can do. Please advise

## 2012-10-23 NOTE — Telephone Encounter (Signed)
Left a message to call our office.

## 2012-10-23 NOTE — Telephone Encounter (Signed)
Patient called back, gave her doctor's reply

## 2012-11-21 ENCOUNTER — Emergency Department (HOSPITAL_COMMUNITY): Payer: Medicare Other

## 2012-11-21 ENCOUNTER — Encounter (HOSPITAL_COMMUNITY): Payer: Self-pay | Admitting: *Deleted

## 2012-11-21 ENCOUNTER — Emergency Department (HOSPITAL_COMMUNITY)
Admission: EM | Admit: 2012-11-21 | Discharge: 2012-11-21 | Disposition: A | Discharge status: Home / Self Care | Payer: Medicare Other | Attending: Emergency Medicine | Admitting: Emergency Medicine

## 2012-11-21 DIAGNOSIS — J449 Chronic obstructive pulmonary disease, unspecified: Secondary | ICD-10-CM | POA: Insufficient documentation

## 2012-11-21 DIAGNOSIS — Y939 Activity, unspecified: Secondary | ICD-10-CM | POA: Insufficient documentation

## 2012-11-21 DIAGNOSIS — Z8659 Personal history of other mental and behavioral disorders: Secondary | ICD-10-CM | POA: Insufficient documentation

## 2012-11-21 DIAGNOSIS — T07XXXA Unspecified multiple injuries, initial encounter: Secondary | ICD-10-CM

## 2012-11-21 DIAGNOSIS — M545 Low back pain, unspecified: Secondary | ICD-10-CM

## 2012-11-21 DIAGNOSIS — Y929 Unspecified place or not applicable: Secondary | ICD-10-CM | POA: Insufficient documentation

## 2012-11-21 DIAGNOSIS — IMO0002 Reserved for concepts with insufficient information to code with codable children: Secondary | ICD-10-CM | POA: Insufficient documentation

## 2012-11-21 DIAGNOSIS — S99929A Unspecified injury of unspecified foot, initial encounter: Secondary | ICD-10-CM | POA: Insufficient documentation

## 2012-11-21 DIAGNOSIS — Z8739 Personal history of other diseases of the musculoskeletal system and connective tissue: Secondary | ICD-10-CM | POA: Insufficient documentation

## 2012-11-21 DIAGNOSIS — Z86711 Personal history of pulmonary embolism: Secondary | ICD-10-CM | POA: Insufficient documentation

## 2012-11-21 DIAGNOSIS — J4489 Other specified chronic obstructive pulmonary disease: Secondary | ICD-10-CM | POA: Insufficient documentation

## 2012-11-21 DIAGNOSIS — S8990XA Unspecified injury of unspecified lower leg, initial encounter: Secondary | ICD-10-CM | POA: Insufficient documentation

## 2012-11-21 DIAGNOSIS — Z79899 Other long term (current) drug therapy: Secondary | ICD-10-CM | POA: Insufficient documentation

## 2012-11-21 DIAGNOSIS — R296 Repeated falls: Secondary | ICD-10-CM | POA: Insufficient documentation

## 2012-11-21 DIAGNOSIS — F17200 Nicotine dependence, unspecified, uncomplicated: Secondary | ICD-10-CM | POA: Insufficient documentation

## 2012-11-21 MED ORDER — CYCLOBENZAPRINE HCL 10 MG PO TABS: 10.0000 mg | ORAL_TABLET | Freq: Two times a day (BID) | ORAL | Status: DC | PRN

## 2012-11-21 MED ORDER — HYDROCODONE-ACETAMINOPHEN 5-325 MG PO TABS
1.0000 | ORAL_TABLET | Freq: Once | ORAL | Status: AC
  Administered 2012-11-21: 1 via ORAL
  Filled 2012-11-21: qty 1

## 2012-11-21 MED ORDER — NAPROXEN 375 MG PO TABS: 375.0000 mg | ORAL_TABLET | Freq: Two times a day (BID) | ORAL | Status: DC

## 2012-11-21 MED ORDER — HYDROCODONE-ACETAMINOPHEN 5-325 MG PO TABS: 1.0000 | ORAL_TABLET | ORAL | Status: DC | PRN

## 2012-11-21 NOTE — ED Provider Notes (Signed)
CSN: [id number]     Arrival date & time 11/21/12  1344 History     First MD Initiated Contact with Patient 11/21/12 1502     Chief Complaint  Patient presents with  . Back Pain  . Leg Pain   (Consider location/radiation/quality/duration/timing/severity/associated sxs/prior Treatment) Patient is a 41 y.o. female presenting with fall. The history is provided by the patient.  Fall This is a new problem. The current episode started in the past 7 days. The problem has been gradually worsening. Pertinent negatives include no abdominal pain, chills, fever, headaches, nausea, neck pain or vomiting. She has tried NSAIDs, heat and acetaminophen for the symptoms. The treatment provided mild relief.   GREGORY DOWE is a 42 y.o. female who presents to the ED with chronic back pain. She states that the pain became worse after she fell through the floor 3 days ago after the floor got wet. When she fell she hit her lower back on a board and then the ground. Pain goes from lower back to lateral aspect of right leg. She has taken goody powders, ibuprofen and the pain is still there.  Denies loss of control of bladder or bowels, denies LOC or head injury.   Past Medical History  Diagnosis Date  . COPD (chronic obstructive pulmonary disease)   . Anxiety   . Panic attacks   . Back pain   . Pulmonary embolism   . Chronic back pain   . DDD (degenerative disc disease)    Past Surgical History  Procedure Laterality Date  . Cesarean section    . Nose surgery    . Tubes tied     Family History  Problem Relation Age of Onset  . Arthritis    . Lung disease    . Cancer    . Asthma     History  Substance Use Topics  . Smoking status: Current Every Day Smoker -- 0.50 packs/day    Types: Cigarettes  . Smokeless tobacco: Not on file  . Alcohol Use: No   OB History   Grav Para Term Preterm Abortions TAB SAB Ect Mult Living                 Review of Systems  Constitutional: Negative for fever and  chills.  HENT: Negative for neck pain.   Eyes: Negative for visual disturbance.  Gastrointestinal: Negative for nausea, vomiting and abdominal pain.  Genitourinary: Negative for dysuria, urgency and frequency.  Musculoskeletal: Positive for back pain.  Skin: Negative for wound.  Neurological: Negative for headaches.  Psychiatric/Behavioral: Nervous/anxious: hx panic attack.     Allergies  Doxycycline; Pyridium; and Tramadol  Home Medications   Current Outpatient Rx  Name  Route  Sig  Dispense  Refill  . acetaminophen (TYLENOL) 325 MG tablet   Oral   Take 650 mg by mouth every 6 (six) hours as needed for pain.         Marland Kitchen albuterol (PROVENTIL HFA;VENTOLIN HFA) 108 (90 BASE) MCG/ACT inhaler   Inhalation   Inhale 2 puffs into the lungs every 6 (six) hours as needed for wheezing (COPD).         Marland Kitchen ibuprofen (ADVIL,MOTRIN) 200 MG tablet   Oral   Take 400 mg by mouth every 6 (six) hours as needed. Pain.          BP 121/80  Pulse 75  Temp(Src) 98.3 F (36.8 C)  Resp 20  SpO2 100% Physical Exam  Nursing note and vitals  reviewed. Constitutional: She is oriented to person, place, and time. She appears well-developed and well-nourished. No distress.  HENT:  Head: Normocephalic.  Eyes: EOM are normal.  Neck: Normal range of motion. Neck supple.  Cardiovascular: Normal rate and regular rhythm.   Pulmonary/Chest: Effort normal and breath sounds normal.  Abdominal: Soft. Bowel sounds are normal. There is no tenderness.  Musculoskeletal:       Lumbar back: She exhibits decreased range of motion, tenderness and spasm. She exhibits no swelling and no laceration.  Pedal pulses equal bilateral, adequate circulation, good touch sensation.   Neurological: She is alert and oriented to person, place, and time. No cranial nerve deficit.  Skin: Skin is warm and dry.  Psychiatric: She has a normal mood and affect. Her behavior is normal.    ED Course  Dg Lumbar Spine  Complete  11/21/2012   *RADIOLOGY REPORT*  Clinical Data: Pain with radicular symptoms; recent trauma  LUMBAR SPINE - COMPLETE 4+ VIEW  Comparison: None.  Findings: Frontal, lateral, spot lumbosacral lateral, and bilateral oblique views were obtained.  There are five non-rib bearing lumbar type vertebral bodies.  There is no fracture or spondylolisthesis. Disc spaces appear intact.  There is no appreciable facet arthropathy.  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  No fracture or appreciable arthropathy.   Original Report Authenticated By: Bretta Bang, M.D.    Procedures MDM  42 y.o. female with low back pain s/p fall through a floor. Will treat muscle spasm and pain and patient is to follow up with ortho.  I have reviewed this patient's vital signs, nurses notes, appropriate labs and imaging.  I have discussed findings with the patient and plan of care and patient voices understanding.    Medication List    TAKE these medications       cyclobenzaprine 10 MG tablet  Commonly known as:  FLEXERIL  Take 1 tablet (10 mg total) by mouth 2 (two) times daily as needed for muscle spasms.     naproxen 375 MG tablet  Commonly known as:  NAPROSYN  Take 1 tablet (375 mg total) by mouth 2 (two) times daily.      ASK your doctor about these medications       acetaminophen 325 MG tablet  Commonly known as:  TYLENOL  Take 650 mg by mouth every 6 (six) hours as needed for pain.     albuterol 108 (90 BASE) MCG/ACT inhaler  Commonly known as:  PROVENTIL HFA;VENTOLIN HFA  Inhale 2 puffs into the lungs every 6 (six) hours as needed for wheezing (COPD).     ibuprofen 200 MG tablet  Commonly known as:  ADVIL,MOTRIN  Take 400 mg by mouth every 6 (six) hours as needed. Pain.         [facility] Orlene Och, [place] 11/21/12 1704

## 2012-11-21 NOTE — ED Notes (Signed)
Pt has hx of chronic back pain, states that the pain became worse after she fell through the floor when it became wet three days ago, pt ambulatory to triage with no problems,

## 2012-11-22 NOTE — ED Provider Notes (Signed)
Medical screening examination/treatment/procedure(s) were performed by non-physician practitioner and as supervising physician I was immediately available for consultation/collaboration. Candelario Steppe, MD, FACEP   Raynor Calcaterra L Kassidy Dockendorf, MD 11/22/12 1138 

## 2012-12-14 ENCOUNTER — Emergency Department (HOSPITAL_COMMUNITY)
Admission: EM | Admit: 2012-12-14 | Discharge: 2012-12-14 | Disposition: A | Discharge status: Home / Self Care | Payer: Medicare Other | Attending: Emergency Medicine | Admitting: Emergency Medicine

## 2012-12-14 ENCOUNTER — Encounter (HOSPITAL_COMMUNITY): Payer: Self-pay | Admitting: *Deleted

## 2012-12-14 DIAGNOSIS — Z8739 Personal history of other diseases of the musculoskeletal system and connective tissue: Secondary | ICD-10-CM | POA: Insufficient documentation

## 2012-12-14 DIAGNOSIS — L0211 Cutaneous abscess of neck: Secondary | ICD-10-CM | POA: Insufficient documentation

## 2012-12-14 DIAGNOSIS — R51 Headache: Secondary | ICD-10-CM | POA: Insufficient documentation

## 2012-12-14 DIAGNOSIS — F411 Generalized anxiety disorder: Secondary | ICD-10-CM | POA: Insufficient documentation

## 2012-12-14 DIAGNOSIS — R519 Headache, unspecified: Secondary | ICD-10-CM

## 2012-12-14 DIAGNOSIS — Z79899 Other long term (current) drug therapy: Secondary | ICD-10-CM | POA: Insufficient documentation

## 2012-12-14 DIAGNOSIS — F17200 Nicotine dependence, unspecified, uncomplicated: Secondary | ICD-10-CM | POA: Insufficient documentation

## 2012-12-14 DIAGNOSIS — M542 Cervicalgia: Secondary | ICD-10-CM | POA: Insufficient documentation

## 2012-12-14 DIAGNOSIS — G8929 Other chronic pain: Secondary | ICD-10-CM | POA: Insufficient documentation

## 2012-12-14 DIAGNOSIS — M549 Dorsalgia, unspecified: Secondary | ICD-10-CM | POA: Insufficient documentation

## 2012-12-14 DIAGNOSIS — J449 Chronic obstructive pulmonary disease, unspecified: Secondary | ICD-10-CM | POA: Insufficient documentation

## 2012-12-14 DIAGNOSIS — Z86711 Personal history of pulmonary embolism: Secondary | ICD-10-CM | POA: Insufficient documentation

## 2012-12-14 DIAGNOSIS — J4489 Other specified chronic obstructive pulmonary disease: Secondary | ICD-10-CM | POA: Insufficient documentation

## 2012-12-14 MED ORDER — OXYCODONE-ACETAMINOPHEN 5-325 MG PO TABS
1.0000 | ORAL_TABLET | Freq: Once | ORAL | Status: AC
  Administered 2012-12-14: 1 via ORAL
  Filled 2012-12-14: qty 1

## 2012-12-14 MED ORDER — KETOROLAC TROMETHAMINE 60 MG/2ML IM SOLN
60.0000 mg | Freq: Once | INTRAMUSCULAR | Status: AC
  Administered 2012-12-14: 60 mg via INTRAMUSCULAR
  Filled 2012-12-14: qty 2

## 2012-12-14 MED ORDER — SULFAMETHOXAZOLE-TRIMETHOPRIM 800-160 MG PO TABS: 1.0000 | ORAL_TABLET | Freq: Two times a day (BID) | ORAL | Status: DC

## 2012-12-14 MED ORDER — NAPROXEN 500 MG PO TABS: 500.0000 mg | ORAL_TABLET | Freq: Two times a day (BID) | ORAL | Status: DC

## 2012-12-14 MED ORDER — DEXAMETHASONE SODIUM PHOSPHATE 4 MG/ML IJ SOLN
8.0000 mg | Freq: Once | INTRAMUSCULAR | Status: AC
  Administered 2012-12-14: 8 mg via INTRAMUSCULAR
  Filled 2012-12-14: qty 2

## 2012-12-14 NOTE — ED Provider Notes (Addendum)
CSN: [id number]     Arrival date & time 12/14/12  1622 History   First MD Initiated Contact with Patient 12/14/12 1646     Chief Complaint  Patient presents with  . Headache   (Consider location/radiation/quality/duration/timing/severity/associated sxs/prior Treatment) Patient is a 42 y.o. female presenting with headaches. The history is provided by the patient.  Headache Pain location:  Frontal Radiates to:  Does not radiate Severity currently:  8/10 Onset quality:  Gradual Duration:  6 weeks Timing:  Constant Progression:  Unchanged Chronicity:  New Similar to prior headaches: no   Relieved by:  Nothing Worsened by:  Light and sound Ineffective treatments:  Aspirin, prescription medications, resting in a darkened room, NSAIDs and acetaminophen Associated symptoms: neck pain (at area of boil) and photophobia   Associated symptoms: no abdominal pain, no congestion, no cough, no ear pain, no facial pain, no fever, no nausea, no sinus pressure, no sore throat and no vomiting    [name] is a 42 y.o. female who presents to the ED with head ache for about 6 weeks. She states that there has been a boil on her neck on the left side x 2 months. Thinks maybe the headache is coming from that. Patient is able to sleep at night but wakes with the headache. States she is under a lot of stress. Her son was in a hunting accident last year and has been having surgeries at [facility]. The last was a few weeks ago. She has to care for her elderly parents who are in bad health and her uncle has an accident 2 weeks ago and had surgery at [facility] and she is trying to care for him.  She has tried to get rid of the place on her neck by mashing it, picked it with a pin and scratched it but unsuccessful at making it go away.   Past Medical History  Diagnosis Date  . COPD (chronic obstructive pulmonary disease)   . Anxiety   . Panic attacks   . Back pain   . Pulmonary embolism   . Chronic back pain   . DDD  (degenerative disc disease)    Past Surgical History  Procedure Laterality Date  . Cesarean section    . Nose surgery    . Tubes tied     Family History  Problem Relation Age of Onset  . Arthritis    . Lung disease    . Cancer    . Asthma     History  Substance Use Topics  . Smoking status: Current Every Day Smoker -- 0.50 packs/day    Types: Cigarettes  . Smokeless tobacco: Not on file  . Alcohol Use: No   OB History   Grav Para Term Preterm Abortions TAB SAB Ect Mult Living                 Review of Systems  Constitutional: Negative for fever.  HENT: Positive for neck pain (at area of boil). Negative for ear pain, congestion, sore throat and sinus pressure.   Eyes: Positive for photophobia.  Respiratory: Negative for cough.   Cardiovascular: Negative for chest pain.  Gastrointestinal: Negative for nausea, vomiting and abdominal pain.  Musculoskeletal: Negative for joint swelling.  Skin: Wound: abscess.  Allergic/Immunologic: Negative for immunocompromised state.  Neurological: Positive for headaches.  Psychiatric/Behavioral: The patient is not nervous/anxious.     Allergies  Doxycycline; Pyridium; and Tramadol  Home Medications   Current Outpatient Rx  Name  Route  Sig  Dispense  Refill  . acetaminophen (TYLENOL) 325 MG tablet   Oral   Take 650 mg by mouth every 6 (six) hours as needed for pain.         Marland Kitchen albuterol (PROVENTIL HFA;VENTOLIN HFA) 108 (90 BASE) MCG/ACT inhaler   Inhalation   Inhale 2 puffs into the lungs every 6 (six) hours as needed for wheezing (COPD).         Marland Kitchen aspirin 325 MG tablet   Oral   Take 325 mg by mouth as needed for pain.         . Aspirin-Acetaminophen-Caffeine (GOODY HEADACHE PO)   Oral   Take 1 packet by mouth as needed (for pain with headache/migraine).         Marland Kitchen ibuprofen (ADVIL,MOTRIN) 200 MG tablet   Oral   Take 400 mg by mouth every 6 (six) hours as needed. Pain.         . cyclobenzaprine (FLEXERIL) 10  MG tablet   Oral   Take 1 tablet (10 mg total) by mouth 2 (two) times daily as needed for muscle spasms.   20 tablet   0   . HYDROcodone-acetaminophen (NORCO/VICODIN) 5-325 MG per tablet   Oral   Take 1 tablet by mouth every 4 (four) hours as needed.   10 tablet   0    BP 122/60  Pulse 87  Temp(Src) 98.3 F (36.8 C) (Oral)  Resp 20  Ht 5\' 1"  (1.549 m)  Wt 240 lb (108.863 kg)  BMI 45.37 kg/m2  SpO2 99%  LMP 11/24/2012 Physical Exam  Nursing note and vitals reviewed. Constitutional: She is oriented to person, place, and time. She appears well-developed and well-nourished. No distress.  HENT:  Head: Normocephalic and atraumatic.  Eyes: Conjunctivae and EOM are normal. Pupils are equal, round, and reactive to light.  Neck: Normal range of motion. Neck supple.  Small abscess to left side of neck at hair line  Cardiovascular: Normal rate, regular rhythm and normal heart sounds.   Pulmonary/Chest: Effort normal and breath sounds normal.  Musculoskeletal: Normal range of motion.  Neurological: She is alert and oriented to person, place, and time. She has normal strength and normal reflexes. No cranial nerve deficit or sensory deficit. She displays a negative Romberg sign. Gait normal.  Stands on one foot without difficulty. Ambulatory with steady gait.  Skin: Skin is warm and dry.  Psychiatric: Her speech is normal and behavior is normal. Thought content normal. Her mood appears anxious.    ED Course: I discussed with case with Dr. Judd Lien  Procedures  Small abscess on the left side of neck cleaned with skin cleaner, opened with 27 G needle and small amount of purulent drainage. Bacitracin ointment and dressing.   MDM  42 y.o. female with frontal headache x 6 weeks. Some improvement with Toradol and Decadron injections given her in the ED.  Small abscess to neck.  Discussed with the patient and all questioned fully answered. Patient to follow up with PCP. Will d/c home with  instructions on headache and abscess. Will treat with Bactrim and Naproxen. Patient to stop OTC aspirin related products and ibuprofen. Discussed ways to help relieve stress.    Medication List    STOP taking these medications       aspirin 325 MG tablet     cyclobenzaprine 10 MG tablet  Commonly known as:  FLEXERIL     GOODY HEADACHE PO     HYDROcodone-acetaminophen 5-325 MG per  tablet  Commonly known as:  NORCO/VICODIN     ibuprofen 200 MG tablet  Commonly known as:  ADVIL,MOTRIN      TAKE these medications       naproxen 500 MG tablet  Commonly known as:  NAPROSYN  Take 1 tablet (500 mg total) by mouth 2 (two) times daily.     sulfamethoxazole-trimethoprim 800-160 MG per tablet  Commonly known as:  SEPTRA DS  Take 1 tablet by mouth every 12 (twelve) hours.      ASK your doctor about these medications       acetaminophen 325 MG tablet  Commonly known as:  TYLENOL  Take 650 mg by mouth every 6 (six) hours as needed for pain.     albuterol 108 (90 BASE) MCG/ACT inhaler  Commonly known as:  PROVENTIL HFA;VENTOLIN HFA  Inhale 2 puffs into the lungs every 6 (six) hours as needed for wheezing (COPD).          [facility] Orlene Och, NP 12/14/12 [address] [place], NP 12/27/12 1352

## 2012-12-14 NOTE — ED Notes (Signed)
Headache for 1.5 mos, pimple to neck and wants his knees checked due to pain.

## 2012-12-14 NOTE — ED Provider Notes (Signed)
Medical screening examination/treatment/procedure(s) were performed by non-physician practitioner and as supervising physician I was immediately available for consultation/collaboration.  Geoffery Lyons, MD 12/14/12 [phone]

## 2012-12-28 NOTE — ED Provider Notes (Signed)
Medical screening examination/treatment/procedure(s) were performed by non-physician practitioner and as supervising physician I was immediately available for consultation/collaboration.  Geoffery Lyons, MD 12/28/12 0111

## 2013-01-22 ENCOUNTER — Encounter (HOSPITAL_COMMUNITY): Payer: Self-pay | Admitting: Emergency Medicine

## 2013-01-22 ENCOUNTER — Emergency Department (HOSPITAL_COMMUNITY)
Admission: EM | Admit: 2013-01-22 | Discharge: 2013-01-22 | Disposition: A | Discharge status: Home / Self Care | Payer: Medicare Other | Attending: Emergency Medicine | Admitting: Emergency Medicine

## 2013-01-22 DIAGNOSIS — Z8739 Personal history of other diseases of the musculoskeletal system and connective tissue: Secondary | ICD-10-CM | POA: Insufficient documentation

## 2013-01-22 DIAGNOSIS — J4489 Other specified chronic obstructive pulmonary disease: Secondary | ICD-10-CM | POA: Insufficient documentation

## 2013-01-22 DIAGNOSIS — F410 Panic disorder [episodic paroxysmal anxiety] without agoraphobia: Secondary | ICD-10-CM | POA: Insufficient documentation

## 2013-01-22 DIAGNOSIS — R1110 Vomiting, unspecified: Secondary | ICD-10-CM | POA: Insufficient documentation

## 2013-01-22 DIAGNOSIS — R220 Localized swelling, mass and lump, head: Secondary | ICD-10-CM | POA: Insufficient documentation

## 2013-01-22 DIAGNOSIS — K029 Dental caries, unspecified: Secondary | ICD-10-CM

## 2013-01-22 DIAGNOSIS — Z79899 Other long term (current) drug therapy: Secondary | ICD-10-CM | POA: Insufficient documentation

## 2013-01-22 DIAGNOSIS — K089 Disorder of teeth and supporting structures, unspecified: Secondary | ICD-10-CM | POA: Insufficient documentation

## 2013-01-22 DIAGNOSIS — G8929 Other chronic pain: Secondary | ICD-10-CM | POA: Insufficient documentation

## 2013-01-22 DIAGNOSIS — J449 Chronic obstructive pulmonary disease, unspecified: Secondary | ICD-10-CM | POA: Insufficient documentation

## 2013-01-22 DIAGNOSIS — Z86711 Personal history of pulmonary embolism: Secondary | ICD-10-CM | POA: Insufficient documentation

## 2013-01-22 DIAGNOSIS — F17200 Nicotine dependence, unspecified, uncomplicated: Secondary | ICD-10-CM | POA: Insufficient documentation

## 2013-01-22 MED ORDER — NAPROXEN 500 MG PO TABS: 500.0000 mg | ORAL_TABLET | Freq: Two times a day (BID) | ORAL | Status: DC

## 2013-01-22 MED ORDER — HYDROCODONE-ACETAMINOPHEN 5-325 MG PO TABS
1.0000 | ORAL_TABLET | Freq: Once | ORAL | Status: AC
  Administered 2013-01-22: 1 via ORAL
  Filled 2013-01-22: qty 1

## 2013-01-22 MED ORDER — AMOXICILLIN 500 MG PO CAPS: 500.0000 mg | ORAL_CAPSULE | Freq: Three times a day (TID) | ORAL | Status: DC

## 2013-01-22 NOTE — ED Notes (Signed)
Pt reports left top dental pain for 2 months. No dentist.

## 2013-01-22 NOTE — ED Provider Notes (Signed)
CSN: [id number]     Arrival date & time 01/22/13  1117 History   First MD Initiated Contact with Patient 01/22/13 1150     Chief Complaint  Patient presents with  . Dental Pain   (Consider location/radiation/quality/duration/timing/severity/associated sxs/prior Treatment) Patient is a 42 y.o. female presenting with tooth pain. The history is provided by the patient.  Dental Pain Location:  Upper Upper teeth location:  14/LU 1st molar and 11/LU cuspid Quality:  Throbbing and constant Onset quality:  Gradual Duration:  2 days Timing:  Constant Progression:  Worsening Chronicity:  New Context: dental caries and dental fracture   Relieved by:  Nothing Worsened by:  Cold food/drink Ineffective treatments:  Acetaminophen, topical anesthetic gel and NSAIDs Associated symptoms: gum swelling   Associated symptoms: no difficulty swallowing, no drooling, no fever, no headaches, no neck swelling, no oral bleeding and no oral lesions    TORRIA FROMER is a 42 y.o. female who presents to the ED with dental pain. The pain started 2 days ago. She has taken several medication OTC for pain without relief.   Past Medical History  Diagnosis Date  . COPD (chronic obstructive pulmonary disease)   . Anxiety   . Panic attacks   . Back pain   . Pulmonary embolism   . Chronic back pain   . DDD (degenerative disc disease)    Past Surgical History  Procedure Laterality Date  . Cesarean section    . Nose surgery    . Tubes tied     Family History  Problem Relation Age of Onset  . Arthritis    . Lung disease    . Cancer    . Asthma     History  Substance Use Topics  . Smoking status: Current Every Day Smoker -- 0.50 packs/day    Types: Cigarettes  . Smokeless tobacco: Not on file  . Alcohol Use: No   OB History   Grav Para Term Preterm Abortions TAB SAB Ect Mult Living                 Review of Systems  Constitutional: Negative for fever and chills.  HENT: Positive for dental  problem. Negative for drooling and mouth sores.   Eyes: Negative for visual disturbance.  Respiratory: Negative for shortness of breath.   Gastrointestinal: Positive for vomiting. Negative for nausea.  Musculoskeletal: Negative for myalgias.  Skin: Negative for rash.  Neurological: Negative for headaches.  Psychiatric/Behavioral: The patient is not nervous/anxious.     Allergies  Doxycycline; Pyridium; and Tramadol  Home Medications   Current Outpatient Rx  Name  Route  Sig  Dispense  Refill  . acetaminophen (TYLENOL) 325 MG tablet   Oral   Take 650 mg by mouth every 6 (six) hours as needed for pain.         Marland Kitchen albuterol (PROVENTIL HFA;VENTOLIN HFA) 108 (90 BASE) MCG/ACT inhaler   Inhalation   Inhale 2 puffs into the lungs every 6 (six) hours as needed for wheezing (COPD).         . citalopram (CELEXA) 20 MG tablet   Oral   Take 1 tablet by mouth daily.         Marland Kitchen doxepin (SINEQUAN) 25 MG capsule   Oral   Take 1 capsule by mouth 2 (two) times daily.          BP 119/63  Pulse 79  Temp(Src) 98.5 F (36.9 C) (Oral)  Resp 18  Ht 5' 1.5" (  1.562 m)  Wt 240 lb (108.863 kg)  BMI 44.62 kg/m2  SpO2 96%  LMP 12/23/2012 Physical Exam  Nursing note and vitals reviewed. Constitutional: She is oriented to person, place, and time. She appears well-developed and well-nourished.  HENT:  Head: Normocephalic.  Mouth/Throat: Uvula is midline, oropharynx is clear and moist and mucous membranes are normal. Dental caries present.    Tender with palpation upper dental area. Broken tooth noted.   Eyes: EOM are normal.  Neck: Neck supple.  Pulmonary/Chest: Effort normal.  Abdominal: Soft. There is no tenderness.  Musculoskeletal: Normal range of motion.  Lymphadenopathy:    She has no cervical adenopathy.  Neurological: She is alert and oriented to person, place, and time. No cranial nerve deficit.  Skin: Skin is warm and dry.  Psychiatric: She has a normal mood and affect.  Her behavior is normal.    ED Course  Procedures   MDM  42 y.o. female with dental pain due to caries. Will treat with antibiotics and NSAIDS. Patient to follow up with the dental clinic as soon as possible. She is stable for discharge home without any immediate complications. Discussed with the patient clinical findings and plan of careand all questioned fully answered.     Medication List    TAKE these medications       amoxicillin 500 MG capsule  Commonly known as:  AMOXIL  Take 1 capsule (500 mg total) by mouth 3 (three) times daily.     naproxen 500 MG tablet  Commonly known as:  NAPROSYN  Take 1 tablet (500 mg total) by mouth 2 (two) times daily.      ASK your doctor about these medications       acetaminophen 325 MG tablet  Commonly known as:  TYLENOL  Take 650 mg by mouth every 6 (six) hours as needed for pain.     albuterol 108 (90 BASE) MCG/ACT inhaler  Commonly known as:  PROVENTIL HFA;VENTOLIN HFA  Inhale 2 puffs into the lungs every 6 (six) hours as needed for wheezing (COPD).     citalopram 20 MG tablet  Commonly known as:  CELEXA  Take 1 tablet by mouth daily.     doxepin 25 MG capsule  Commonly known as:  SINEQUAN  Take 1 capsule by mouth 2 (two) times daily.           Janne Napoleon, NP 01/23/13 1705

## 2013-01-22 NOTE — ED Notes (Signed)
Dental pain lt upper teeth, Pt has a broken molar , Pain for 2 mos , but worse for last 2 days.  Alert, NAD.

## 2013-01-24 NOTE — ED Provider Notes (Signed)
Medical screening examination/treatment/procedure(s) were performed by non-physician practitioner and as supervising physician I was immediately available for consultation/collaboration.   Hilda Rynders M Osher Oettinger, DO 01/24/13 2202 

## 2013-02-21 ENCOUNTER — Encounter (HOSPITAL_COMMUNITY): Payer: Self-pay | Admitting: Emergency Medicine

## 2013-02-21 ENCOUNTER — Emergency Department (HOSPITAL_COMMUNITY)
Admission: EM | Admit: 2013-02-21 | Discharge: 2013-02-21 | Disposition: A | Discharge status: Home / Self Care | Payer: Medicare Other | Attending: Emergency Medicine | Admitting: Emergency Medicine

## 2013-02-21 DIAGNOSIS — M549 Dorsalgia, unspecified: Secondary | ICD-10-CM | POA: Insufficient documentation

## 2013-02-21 DIAGNOSIS — Z8659 Personal history of other mental and behavioral disorders: Secondary | ICD-10-CM | POA: Insufficient documentation

## 2013-02-21 DIAGNOSIS — Y9389 Activity, other specified: Secondary | ICD-10-CM | POA: Insufficient documentation

## 2013-02-21 DIAGNOSIS — Y929 Unspecified place or not applicable: Secondary | ICD-10-CM | POA: Insufficient documentation

## 2013-02-21 DIAGNOSIS — X503XXA Overexertion from repetitive movements, initial encounter: Secondary | ICD-10-CM | POA: Insufficient documentation

## 2013-02-21 DIAGNOSIS — S39012A Strain of muscle, fascia and tendon of lower back, initial encounter: Secondary | ICD-10-CM

## 2013-02-21 DIAGNOSIS — Z86711 Personal history of pulmonary embolism: Secondary | ICD-10-CM | POA: Insufficient documentation

## 2013-02-21 DIAGNOSIS — J449 Chronic obstructive pulmonary disease, unspecified: Secondary | ICD-10-CM | POA: Insufficient documentation

## 2013-02-21 DIAGNOSIS — G8929 Other chronic pain: Secondary | ICD-10-CM | POA: Insufficient documentation

## 2013-02-21 DIAGNOSIS — S335XXA Sprain of ligaments of lumbar spine, initial encounter: Secondary | ICD-10-CM | POA: Insufficient documentation

## 2013-02-21 DIAGNOSIS — F17200 Nicotine dependence, unspecified, uncomplicated: Secondary | ICD-10-CM | POA: Insufficient documentation

## 2013-02-21 DIAGNOSIS — J4489 Other specified chronic obstructive pulmonary disease: Secondary | ICD-10-CM | POA: Insufficient documentation

## 2013-02-21 DIAGNOSIS — X500XXA Overexertion from strenuous movement or load, initial encounter: Secondary | ICD-10-CM | POA: Insufficient documentation

## 2013-02-21 MED ORDER — CYCLOBENZAPRINE HCL 10 MG PO TABS: 10.0000 mg | ORAL_TABLET | Freq: Three times a day (TID) | ORAL | Status: DC | PRN

## 2013-02-21 MED ORDER — CYCLOBENZAPRINE HCL 10 MG PO TABS
10.0000 mg | ORAL_TABLET | Freq: Once | ORAL | Status: AC
  Administered 2013-02-21: 10 mg via ORAL
  Filled 2013-02-21: qty 1

## 2013-02-21 MED ORDER — HYDROCODONE-ACETAMINOPHEN 5-325 MG PO TABS
1.0000 | ORAL_TABLET | Freq: Once | ORAL | Status: AC
  Administered 2013-02-21: 1 via ORAL
  Filled 2013-02-21: qty 1

## 2013-02-21 MED ORDER — NAPROXEN 500 MG PO TABS: 500.0000 mg | ORAL_TABLET | Freq: Two times a day (BID) | ORAL | Status: DC

## 2013-02-21 NOTE — ED Notes (Signed)
Patient states she was lifting something heavy this morning and is now complaining of lower back pain. Patient has history of same.

## 2013-02-21 NOTE — ED Provider Notes (Signed)
CSN: [id number]     Arrival date & time 02/21/13  1945 History   First MD Initiated Contact with Patient 02/21/13 2011     Chief Complaint  Patient presents with  . Back Pain   (Consider location/radiation/quality/duration/timing/severity/associated sxs/prior Treatment) Patient is a 42 y.o. female presenting with back pain. The history is provided by the patient.  Back Pain Location:  Lumbar spine Quality:  Aching Radiates to:  Does not radiate Pain severity:  Moderate Pain is:  Same all the time Onset quality:  Sudden Timing:  Constant Progression:  Unchanged Chronicity:  New Context: lifting heavy objects   Context comment:  Patient with history of chronic low back pain complains of worsening pain after lifting something heavy earlier. Relieved by:  Nothing Worsened by:  Standing, twisting and bending Ineffective treatments:  None tried Associated symptoms: no abdominal pain, no abdominal swelling, no bladder incontinence, no bowel incontinence, no chest pain, no dysuria, no fever, no headaches, no leg pain, no numbness, no paresthesias, no pelvic pain, no perianal numbness, no tingling and no weakness     Past Medical History  Diagnosis Date  . COPD (chronic obstructive pulmonary disease)   . Anxiety   . Panic attacks   . Back pain   . Pulmonary embolism   . Chronic back pain   . DDD (degenerative disc disease)    Past Surgical History  Procedure Laterality Date  . Cesarean section    . Nose surgery    . Tubes tied     Family History  Problem Relation Age of Onset  . Arthritis    . Lung disease    . Cancer    . Asthma     History  Substance Use Topics  . Smoking status: Current Every Day Smoker -- 0.50 packs/day    Types: Cigarettes  . Smokeless tobacco: Not on file  . Alcohol Use: No   OB History   Grav Para Term Preterm Abortions TAB SAB Ect Mult Living                 Review of Systems  Constitutional: Negative for fever.  Respiratory: Negative  for shortness of breath.   Cardiovascular: Negative for chest pain.  Gastrointestinal: Negative for vomiting, abdominal pain, constipation and bowel incontinence.  Genitourinary: Negative for bladder incontinence, dysuria, hematuria, flank pain, decreased urine volume, difficulty urinating and pelvic pain.       No perineal numbness or incontinence of urine or feces  Musculoskeletal: Positive for back pain. Negative for joint swelling.  Skin: Negative for rash.  Neurological: Negative for tingling, weakness, numbness, headaches and paresthesias.  All other systems reviewed and are negative.    Allergies  Doxycycline; Pyridium; and Tramadol  Home Medications   Current Outpatient Rx  Name  Route  Sig  Dispense  Refill  . albuterol (PROVENTIL HFA;VENTOLIN HFA) 108 (90 BASE) MCG/ACT inhaler   Inhalation   Inhale 2 puffs into the lungs every 6 (six) hours as needed for wheezing (COPD).         Marland Kitchen ibuprofen (ADVIL,MOTRIN) 200 MG tablet   Oral   Take 400 mg by mouth every 6 (six) hours as needed.         . naproxen (NAPROSYN) 500 MG tablet   Oral   Take 1 tablet (500 mg total) by mouth 2 (two) times daily.   20 tablet   0    BP 108/66  Pulse 86  Temp(Src) 98.3 F (36.8 C) (Oral)  Resp 18  Ht 5\' 1"  (1.549 m)  Wt 240 lb (108.863 kg)  BMI 45.37 kg/m2  SpO2 96%  LMP 02/19/2013 Physical Exam  Nursing note and vitals reviewed. Constitutional: She is oriented to person, place, and time. She appears well-developed and well-nourished. No distress.  HENT:  Head: Normocephalic and atraumatic.  Neck: Normal range of motion. Neck supple.  Cardiovascular: Normal rate, regular rhythm, normal heart sounds and intact distal pulses.   No murmur heard. Pulmonary/Chest: Effort normal and breath sounds normal. No respiratory distress.  Abdominal: Soft. She exhibits no distension. There is no tenderness.  Musculoskeletal: She exhibits tenderness. She exhibits no edema.       Lumbar back:  She exhibits tenderness and pain. She exhibits normal range of motion, no swelling, no deformity, no laceration and normal pulse.  ttp of the   Sacral area of the spine and lower lumbar paraspinal muscles.   DP pulses are brisk and symmetrical.  Distal sensation intact.  Hip Flexors/Extensors are intact  Neurological: She is alert and oriented to person, place, and time. She has normal strength. No sensory deficit. She exhibits normal muscle tone. Coordination and gait normal.  Reflex Scores:      Patellar reflexes are 2+ on the right side and 2+ on the left side.      Achilles reflexes are 2+ on the right side and 2+ on the left side. Skin: Skin is warm and dry. No rash noted.    ED Course  Procedures (including critical care time) Labs Review Labs Reviewed - No data to display Imaging Review No results found.  EKG Interpretation   None       MDM    Patient with frequent ED visits for pain complaints.  Previous ED chart reviewed.    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 No focal neuro deficits. Patient ambulates with a steady gait. No concerning symptoms for emergency neurological or infectious process. Patient does have a history of chronic back pain. No history of trauma today to indicate need for imaging at this time.  Patient agrees to symptomatic treatment with rest, ice, and Flexeril. She appears stable for discharge  Arek Spadafore L. Trisha Mangle, PA-C 02/23/13 0038

## 2013-02-23 NOTE — ED Provider Notes (Signed)
Medical screening examination/treatment/procedure(s) were performed by non-physician practitioner and as supervising physician I was immediately available for consultation/collaboration.  EKG Interpretation   None         Indiana Pechacek, MD 02/23/13 2120 

## 2013-11-19 ENCOUNTER — Emergency Department (HOSPITAL_COMMUNITY)
Admission: EM | Admit: 2013-11-19 | Discharge: 2013-11-19 | Disposition: A | Discharge status: Home / Self Care | Payer: Medicare Other | Attending: Emergency Medicine | Admitting: Emergency Medicine

## 2013-11-19 ENCOUNTER — Encounter (HOSPITAL_COMMUNITY): Payer: Self-pay | Admitting: Emergency Medicine

## 2013-11-19 DIAGNOSIS — Z791 Long term (current) use of non-steroidal anti-inflammatories (NSAID): Secondary | ICD-10-CM | POA: Diagnosis not present

## 2013-11-19 DIAGNOSIS — J449 Chronic obstructive pulmonary disease, unspecified: Secondary | ICD-10-CM | POA: Diagnosis not present

## 2013-11-19 DIAGNOSIS — Z86711 Personal history of pulmonary embolism: Secondary | ICD-10-CM | POA: Diagnosis not present

## 2013-11-19 DIAGNOSIS — G8929 Other chronic pain: Secondary | ICD-10-CM | POA: Diagnosis not present

## 2013-11-19 DIAGNOSIS — F17200 Nicotine dependence, unspecified, uncomplicated: Secondary | ICD-10-CM | POA: Insufficient documentation

## 2013-11-19 DIAGNOSIS — M545 Low back pain, unspecified: Secondary | ICD-10-CM | POA: Diagnosis not present

## 2013-11-19 DIAGNOSIS — Z8659 Personal history of other mental and behavioral disorders: Secondary | ICD-10-CM | POA: Diagnosis not present

## 2013-11-19 DIAGNOSIS — M549 Dorsalgia, unspecified: Secondary | ICD-10-CM

## 2013-11-19 DIAGNOSIS — J4489 Other specified chronic obstructive pulmonary disease: Secondary | ICD-10-CM | POA: Insufficient documentation

## 2013-11-19 NOTE — ED Notes (Signed)
Back been hurting for 3 days, rates pain 8.  Took Naprosyn and aleve with no relief.

## 2013-11-19 NOTE — Discharge Instructions (Signed)
SEEK IMMEDIATE MEDICAL ATTENTION IF: New numbness, tingling, weakness, or problem with the use of your arms or legs.  Severe back pain not relieved with medications.  Change in bowel or bladder control (if you lose control of stool or urine, or if you are unable to urinate) Increasing pain in any areas of the body (such as chest or abdominal pain).  Shortness of breath, dizziness or fainting.  Nausea (feeling sick to your stomach), vomiting, fever, or sweats.     [facility] Dept. [address]         [place] Phone:  [phone]                                  Phone:  [phone]                   Phone:  [phone]  [place], [place] in [place], [address],                                  [place] [phone] or [phone] (After Hours)   [place]  Substance Abuse Resources: - Alcohol and Drug Services  [phone] - [place] [phone] - [facility] [place] [phone] - Residential & Outpatient Substance Abuse Program  [phone]  Psychological Services: - [place]  [facility]  [place], [phone] [place]. [address] [place], [place]: [phone] or [phone], [email or web address]  -  - [place] Department- [place] Department- [place] Department- [phone]

## 2013-11-19 NOTE — ED Notes (Signed)
Pt with chronic back pain

## 2013-11-19 NOTE — ED Provider Notes (Signed)
CSN: [id number]     Arrival date & time 11/19/13  1215 History  This chart was scribed for Sharyon Cable, MD by Ludger Nutting, ED Scribe. This patient was seen in room APA07/APA07 and the patient's care was started 12:44 PM.    Chief Complaint  Patient presents with  . Back Pain    Patient is a 43 y.o. female presenting with back pain. The history is provided by the patient. No language interpreter was used.  Back Pain Location:  Lumbar spine Pain severity:  Mild Pain is:  Same all the time Timing:  Constant Progression:  Unchanged Chronicity:  Chronic Associated symptoms: leg pain   Associated symptoms: no abdominal pain, no bladder incontinence, no bowel incontinence, no dysuria, no fever, no numbness and no weakness     HPI Comments: [name] is a 43 y.o. female who presents to the Emergency Department complaining of chronic, constant lower back pain that occurs 24 hours a day with radiation to the left leg that worsened today. Patient states she used to receive cortisone injections but no longer has follow up with a PCP. She has taken ibuprofen, naproxen, aleve, aspirin without relief. She denies fever, vomiting, abdominal pain, dysuria, bowel or bladder incontinence, weakness, numbness.   Past Medical History  Diagnosis Date  . COPD (chronic obstructive pulmonary disease)   . Anxiety   . Panic attacks   . Back pain   . Pulmonary embolism   . Chronic back pain   . DDD (degenerative disc disease)    Past Surgical History  Procedure Laterality Date  . Cesarean section    . Nose surgery    . Tubes tied     Family History  Problem Relation Age of Onset  . Arthritis    . Lung disease    . Cancer    . Asthma     History  Substance Use Topics  . Smoking status: Current Every Day Smoker -- 0.50 packs/day    Types: Cigarettes  . Smokeless tobacco: Not on file  . Alcohol Use: No   OB History   Grav Para Term Preterm Abortions TAB SAB Ect Mult Living                  Review of Systems  Constitutional: Negative for fever.  Gastrointestinal: Negative for abdominal pain and bowel incontinence.  Genitourinary: Negative for bladder incontinence and dysuria.  Musculoskeletal: Positive for back pain.  Neurological: Negative for weakness and numbness.  All other systems reviewed and are negative.     Allergies  Doxycycline; Pyridium; and Tramadol  Home Medications   Prior to Admission medications   Medication Sig Start Date End Date Taking? Authorizing Provider  albuterol (PROVENTIL HFA;VENTOLIN HFA) 108 (90 BASE) MCG/ACT inhaler Inhale 2 puffs into the lungs every 6 (six) hours as needed for wheezing (COPD).    Historical Provider, MD  cyclobenzaprine (FLEXERIL) 10 MG tablet Take 1 tablet (10 mg total) by mouth 3 (three) times daily as needed. 02/21/13   Tammy L. Triplett, PA-C  ibuprofen (ADVIL,MOTRIN) 200 MG tablet Take 400 mg by mouth every 6 (six) hours as needed.    Historical Provider, MD  naproxen (NAPROSYN) 500 MG tablet Take 1 tablet (500 mg total) by mouth 2 (two) times daily. 01/22/13   Hope Bunnie Pion, NP  naproxen (NAPROSYN) 500 MG tablet Take 1 tablet (500 mg total) by mouth 2 (two) times daily. 02/21/13   Tammy L. Triplett, PA-C   BP  128/86  Pulse 80  Temp(Src) 98.5 F (36.9 C) (Oral)  Resp 18  Ht 5' 1.5" (1.562 m)  Wt 240 lb (108.863 kg)  BMI 44.62 kg/m2  SpO2 100%  LMP 11/12/2013 Physical Exam  Nursing note and vitals reviewed. CONSTITUTIONAL: Well developed/well nourished HEAD: Normocephalic/atraumatic EYES: EOMI/PERRL ENMT: Mucous membranes moist NECK: supple no meningeal signs SPINE: mild tenderness to lumbar spine, No bruising/crepitance/stepoffs noted to spine CV: S1/S2 noted, no murmurs/rubs/gallops noted LUNGS: Lungs are clear to auscultation bilaterally, no apparent distress ABDOMEN: soft, nontender, no rebound or guarding GU:no cva tenderness NEURO: Awake/alert, equal motor 5/5 strength noted with the  following: hip flexion/knee flexion/extension, foot dorsi/plantar flexion, great toe extension intact bilaterally, no clonus bilaterally, plantar reflex appropriate (toes downgoing), no sensory deficit in any dermatome.  Equal patellar/achilles reflex noted (2+) in bilateral lower extremities.  Pt is able to ambulate unassisted. EXTREMITIES: pulses normal, full ROM SKIN: warm, color normal PSYCH: no abnormalities of mood noted    ED Course  Procedures   DIAGNOSTIC STUDIES: Oxygen Saturation is 100% on RA, normal by my interpretation.    COORDINATION OF CARE: 12:49 PM Discussed treatment plan with pt at bedside and pt agreed to plan.  Pt advised need for outpatient management of her chronic pain  MDM   Final diagnoses:  Chronic back pain    Nursing notes including past medical history and social history reviewed and considered in documentation   I personally performed the services described in this documentation, which was scribed in my presence. The recorded information has been reviewed and is accurate.      Sharyon Cable, MD 11/19/13 [phone]

## 2013-12-01 ENCOUNTER — Emergency Department (HOSPITAL_COMMUNITY)
Admission: EM | Admit: 2013-12-01 | Discharge: 2013-12-01 | Disposition: A | Discharge status: Home / Self Care | Payer: Medicare Other | Attending: Emergency Medicine | Admitting: Emergency Medicine

## 2013-12-01 ENCOUNTER — Encounter (HOSPITAL_COMMUNITY): Payer: Self-pay | Admitting: Emergency Medicine

## 2013-12-01 DIAGNOSIS — Z86711 Personal history of pulmonary embolism: Secondary | ICD-10-CM | POA: Insufficient documentation

## 2013-12-01 DIAGNOSIS — IMO0002 Reserved for concepts with insufficient information to code with codable children: Secondary | ICD-10-CM | POA: Diagnosis not present

## 2013-12-01 DIAGNOSIS — J4489 Other specified chronic obstructive pulmonary disease: Secondary | ICD-10-CM | POA: Insufficient documentation

## 2013-12-01 DIAGNOSIS — Z8659 Personal history of other mental and behavioral disorders: Secondary | ICD-10-CM | POA: Diagnosis not present

## 2013-12-01 DIAGNOSIS — J449 Chronic obstructive pulmonary disease, unspecified: Secondary | ICD-10-CM | POA: Insufficient documentation

## 2013-12-01 DIAGNOSIS — G8929 Other chronic pain: Secondary | ICD-10-CM | POA: Diagnosis not present

## 2013-12-01 DIAGNOSIS — Z8739 Personal history of other diseases of the musculoskeletal system and connective tissue: Secondary | ICD-10-CM | POA: Insufficient documentation

## 2013-12-01 DIAGNOSIS — F17200 Nicotine dependence, unspecified, uncomplicated: Secondary | ICD-10-CM | POA: Diagnosis not present

## 2013-12-01 DIAGNOSIS — Z792 Long term (current) use of antibiotics: Secondary | ICD-10-CM | POA: Insufficient documentation

## 2013-12-01 DIAGNOSIS — L0291 Cutaneous abscess, unspecified: Secondary | ICD-10-CM

## 2013-12-01 DIAGNOSIS — L0390 Cellulitis, unspecified: Secondary | ICD-10-CM

## 2013-12-01 MED ORDER — SULFAMETHOXAZOLE-TMP DS 800-160 MG PO TABS: 1.0000 | ORAL_TABLET | Freq: Two times a day (BID) | ORAL | Status: DC

## 2013-12-01 MED ORDER — HYDROCODONE-ACETAMINOPHEN 5-325 MG PO TABS
2.0000 | ORAL_TABLET | Freq: Once | ORAL | Status: AC
  Administered 2013-12-01: 2 via ORAL
  Filled 2013-12-01: qty 2

## 2013-12-01 MED ORDER — HYDROCODONE-ACETAMINOPHEN 5-325 MG PO TABS: ORAL_TABLET | ORAL | Status: DC

## 2013-12-01 MED ORDER — SULFAMETHOXAZOLE-TMP DS 800-160 MG PO TABS
1.0000 | ORAL_TABLET | Freq: Once | ORAL | Status: AC
  Administered 2013-12-01: 1 via ORAL
  Filled 2013-12-01: qty 1

## 2013-12-01 MED ORDER — PROMETHAZINE HCL 12.5 MG PO TABS
12.5000 mg | ORAL_TABLET | Freq: Once | ORAL | Status: AC
  Administered 2013-12-01: 12.5 mg via ORAL
  Filled 2013-12-01: qty 1

## 2013-12-01 MED ORDER — KETOROLAC TROMETHAMINE 10 MG PO TABS
10.0000 mg | ORAL_TABLET | Freq: Once | ORAL | Status: AC
  Administered 2013-12-01: 10 mg via ORAL
  Filled 2013-12-01: qty 1

## 2013-12-01 MED ORDER — BUPIVACAINE HCL (PF) 0.25 % IJ SOLN
INTRAMUSCULAR | Status: AC
  Filled 2013-12-01: qty 30

## 2013-12-01 MED ORDER — LIDOCAINE HCL (PF) 1 % IJ SOLN
30.0000 mL | Freq: Once | INTRAMUSCULAR | Status: DC
  Filled 2013-12-01: qty 30

## 2013-12-01 MED ORDER — CEPHALEXIN 500 MG PO CAPS: 500.0000 mg | ORAL_CAPSULE | Freq: Four times a day (QID) | ORAL | Status: DC

## 2013-12-01 NOTE — ED Provider Notes (Signed)
CSN: [id number]     Arrival date & time 12/01/13  1758 History   First MD Initiated Contact with Patient 12/01/13 2032    This chart was scribed for No att. providers found by Terressa Koyanagi, ED Scribe. This patient was seen in room APAH6/APAH6 and the patient's care was started at 8:34 PM.  Chief Complaint  Patient presents with  . Abscess   HPI HPI Comments: [name] is a 43 y.o. female, with medical Hx noted below, who presents to the Emergency Department complaining of an abscess to the left forearm area onset 5 days ago. Pt denies any drainage from the abscess site. Pt also complains of associated dizziness (onset 2 days ago) and blisters on lips. Pt describes her dizziness as the room spinning and not as lightheadedness. Pt states that she was seen at the Urgent Care for the same earlier today and was directed to come to the ED. Pt denies blurred or double vision. Pt further denies taking any daily meds. Pt reports she is UTD on her tetanus shot.    Past Medical History  Diagnosis Date  . COPD (chronic obstructive pulmonary disease)   . Anxiety   . Panic attacks   . Back pain   . Pulmonary embolism   . Chronic back pain   . DDD (degenerative disc disease)    Past Surgical History  Procedure Laterality Date  . Cesarean section    . Nose surgery    . Tubes tied     Family History  Problem Relation Age of Onset  . Arthritis    . Lung disease    . Cancer    . Asthma     History  Substance Use Topics  . Smoking status: Current Every Day Smoker -- 0.50 packs/day    Types: Cigarettes  . Smokeless tobacco: Not on file  . Alcohol Use: No   OB History   Grav Para Term Preterm Abortions TAB SAB Ect Mult Living                 Review of Systems  A complete 10 system review of systems was obtained and all systems are negative except as noted in the HPI and PMH.   Allergies  Doxycycline; Pyridium; and Tramadol  Home Medications   Prior to Admission medications    Medication Sig Start Date End Date Taking? Authorizing Provider  cephALEXin (KEFLEX) 500 MG capsule Take 1 capsule (500 mg total) by mouth 4 (four) times daily. 12/01/13   Ezequiel Essex, MD  HYDROcodone-acetaminophen (NORCO/VICODIN) 5-325 MG per tablet 1 or 2 po q4h prn pain 12/01/13   Lenox Ahr, PA-C  sulfamethoxazole-trimethoprim (BACTRIM DS) 800-160 MG per tablet Take 1 tablet by mouth 2 (two) times daily. 12/01/13   Lenox Ahr, PA-C   Triage Vitals: BP 106/75  Pulse 84  Temp(Src) 98.7 F (37.1 C) (Oral)  Resp 20  Ht 5\' 1"  (1.549 m)  Wt 212 lb (96.163 kg)  BMI 40.08 kg/m2  SpO2 99%  LMP 11/12/2013 Physical Exam  Nursing note and vitals reviewed. Constitutional: She is oriented to person, place, and time. She appears well-developed and well-nourished. No distress.  HENT:  Head: Normocephalic and atraumatic.  Mouth/Throat: Oropharynx is clear and moist. No oropharyngeal exudate.  Eyes: Conjunctivae and EOM are normal. Pupils are equal, round, and reactive to light.  Neck: Normal range of motion. Neck supple.  No meningismus.  Cardiovascular: Normal rate, regular rhythm, normal heart sounds and intact  distal pulses.   No murmur heard. Pulmonary/Chest: Effort normal and breath sounds normal. No respiratory distress.  Abdominal: Soft. There is no tenderness. There is no rebound and no guarding.  Musculoskeletal: Normal range of motion. She exhibits no edema and no tenderness.  Neurological: She is alert and oriented to person, place, and time. No cranial nerve deficit. She exhibits normal muscle tone. Coordination normal.  No ataxia on finger to nose bilaterally. No pronator drift. 5/5 strength throughout. CN 2-12 intact. Negative Romberg. Equal grip strength. Sensation intact. Gait is normal.   Skin: Skin is warm.  Erythematous, pustular lesions in upper lip and lower lip, no vesicles.    2cm abscess to left forearm surrounding erythema and induration. FROM left hand,  intact radial pulse.   Psychiatric: She has a normal mood and affect. Her behavior is normal.    ED Course  Procedures (including critical care time) DIAGNOSTIC STUDIES: Oxygen Saturation is 99% on RA, nl by my interpretation.    COORDINATION OF CARE: 8:40 PM-Discussed treatment plan which includes EKG, labs and meds with pt at bedside and pt agreed to plan.   Labs Review Labs Reviewed  CULTURE, ROUTINE-ABSCESS  CBC WITH DIFFERENTIAL  COMPREHENSIVE METABOLIC PANEL  TROPONIN I    Imaging Review No results found.   EKG Interpretation   Date/Time:  Saturday December 01 2013 20:49:46 EDT Ventricular Rate:  88 PR Interval:  154 QRS Duration: 93 QT Interval:  371 QTC Calculation: 449 R Axis:   53 Text Interpretation:  Sinus rhythm No significant change was found  Confirmed by Nasha Diss  MD, Nicholos Aloisi ([id number]) on 12/01/2013 8:56:08 PM      MDM   Final diagnoses:  Abscess and cellulitis   Left arm abscess for the past 4 days. Denies injury. Denies IV drug abuse. Denies fever. Had vomiting yesterday but none today. Endorses lightheadedness and dizziness with standing. Neuro exam nonfocal.  Incision and drainage of abscess performed by [facility]  Patient refuses blood work or further evaluation of her dizziness. She states she feels her dizziness is from her abscess. She is able to ambulate without assistance, no focal deficits. Advised to discontinue chap stick use as likely responsible to pustules.  Does not appear herpetic.  Wound care instructions given, antibiotics, given.  Recheck wound in 48 hours at urgent care. Return to the ED sooner with worsening symptoms.   I personally performed the services described in this documentation, which was scribed in my presence. The recorded information has been reviewed and is accurate.   Ezequiel Essex, MD 12/02/13 [phone]

## 2013-12-01 NOTE — ED Notes (Signed)
Pt c/o abscess to left forearm area for the past few days, was seen at urgent care [facility] urgent care) who advised pt to go to er due to having fever, n/v, dizziness. Pt states that she is still experiencing dizziness, fevers, denies any n/v today.

## 2013-12-01 NOTE — Discharge Instructions (Signed)
Abscess Follow up at urgent care for a recheck in 2 days. Take the antibiotics as prescribed. Return to the ED if you develop new or worsening symptoms. An abscess is an infected area that contains a collection of pus and debris.It can occur in almost any part of the body. An abscess is also known as a furuncle or boil. CAUSES  An abscess occurs when tissue gets infected. This can occur from blockage of oil or sweat glands, infection of hair follicles, or a minor injury to the skin. As the body tries to fight the infection, pus collects in the area and creates pressure under the skin. This pressure causes pain. People with weakened immune systems have difficulty fighting infections and get certain abscesses more often.  SYMPTOMS Usually an abscess develops on the skin and becomes a painful mass that is red, warm, and tender. If the abscess forms under the skin, you may feel a moveable soft area under the skin. Some abscesses break open (rupture) on their own, but most will continue to get worse without care. The infection can spread deeper into the body and eventually into the bloodstream, causing you to feel ill.  DIAGNOSIS  Your caregiver will take your medical history and perform a physical exam. A sample of fluid may also be taken from the abscess to determine what is causing your infection. TREATMENT  Your caregiver may prescribe antibiotic medicines to fight the infection. However, taking antibiotics alone usually does not cure an abscess. Your caregiver may need to make a small cut (incision) in the abscess to drain the pus. In some cases, gauze is packed into the abscess to reduce pain and to continue draining the area. HOME CARE INSTRUCTIONS   Only take over-the-counter or prescription medicines for pain, discomfort, or fever as directed by your caregiver.  If you were prescribed antibiotics, take them as directed. Finish them even if you start to feel better.  If gauze is used, follow  your caregiver's directions for changing the gauze.  To avoid spreading the infection:  Keep your draining abscess covered with a bandage.  Wash your hands well.  Do not share personal care items, towels, or whirlpools with others.  Avoid skin contact with others.  Keep your skin and clothes clean around the abscess.  Keep all follow-up appointments as directed by your caregiver. SEEK MEDICAL CARE IF:   You have increased pain, swelling, redness, fluid drainage, or bleeding.  You have muscle aches, chills, or a general ill feeling.  You have a fever. MAKE SURE YOU:   Understand these instructions.  Will watch your condition.  Will get help right away if you are not doing well or get worse. Document Released: 01/06/2005 Document Revised: 09/28/2011 Document Reviewed: 06/11/2011 [facility] Patient Information 2015 [place], [place]. This information is not intended to replace advice given to you by your health care provider. Make sure you discuss any questions you have with your health care provider.

## 2013-12-01 NOTE — ED Provider Notes (Signed)
INCISION AND DRAINAGE OF ABSCESS LEFT FOREARM  Patient identified by arm band. Permission for procedure given by the patient. The patient gives a 3 to four-day history of increasing pain of the left forearm. She notices A. raised red tender area that is getting progressively worse. The patient was seen at the [facility] urgent care concerning this, and was told to come to the emergency department. Vital signs are within normal limits.  The procedure was explained to the patient in terms which he understood, and she gave verbal permission to proceed with the procedure. A procedural time out was taken. The wound was cleansed with chlorhexidine. The area was infiltrated with 0.25% bupivacaine. After adequate anesthetic, the area was painted with Betadine, and draped in the usual sterile fashion. Incision was carried out with a #11 blade scalpel. Copious amount of purulent drainage was removed. Culture was obtained. The wound was irrigated, and loculations broken up. The wound was then irrigated a second time. Sterile dressing was applied. Culture was sent to the lab. Patient is allergic to doxycycline, and patient was treated in the emergency department with Septra. Pain medication was also given. Patient was given instructions to return if any signs of advancing infection. Patient tolerated the procedure without problem.  Lenox Ahr, PA-C 12/01/13 2141

## 2013-12-01 NOTE — ED Notes (Signed)
Patient refused blood draw.

## 2013-12-01 NOTE — ED Notes (Signed)
Approximately 3cm indurated papule L forearm, warm to touch.  States it "feels like a hot poker on my skin".  Patient also has clustered small pustules on erythematous base just above L upper lip as well as on upper and lower lip line.  States she noticed these today and they have a "tingling" sensation.

## 2013-12-02 NOTE — ED Provider Notes (Signed)
Medical screening examination/treatment/procedure(s) were conducted as a shared visit with non-physician practitioner(s) and myself.  I personally evaluated the patient during the encounter.   EKG Interpretation   Date/Time:  Saturday December 01 2013 20:49:46 EDT Ventricular Rate:  88 PR Interval:  154 QRS Duration: 93 QT Interval:  371 QTC Calculation: 449 R Axis:   53 Text Interpretation:  Sinus rhythm No significant change was found  Confirmed by Wyvonnia Dusky  MD, Tonia Avino ([id number]) on 12/01/2013 8:56:08 PM       Ezequiel Essex, MD 12/02/13 [phone]

## 2013-12-06 ENCOUNTER — Telehealth (HOSPITAL_BASED_OUTPATIENT_CLINIC_OR_DEPARTMENT_OTHER): Payer: Self-pay | Admitting: Emergency Medicine

## 2013-12-06 LAB — CULTURE, ROUTINE-ABSCESS

## 2013-12-06 NOTE — Telephone Encounter (Signed)
Post ED Visit - Positive Culture Follow-up  Culture report reviewed by antimicrobial stewardship pharmacist: []  Wes Dulaney, Pharm.D., BCPS []  Heide Guile, Pharm.D., BCPS []  Alycia Rossetti, Pharm.D., BCPS []  [place], [place].D., BCPS, AAHIVP []  Legrand Como, Pharm.D., BCPS, AAHIVP [x]  Hassie Bruce, Pharm.D. []  Milus Glazier, Pharm.D.  Positive Abscess culture Treated with Cephalexin, organism sensitive to the same and no further patient follow-up is required at this time.  Ernesta Amble 12/06/2013, 5:09 PM

## 2013-12-18 ENCOUNTER — Encounter (HOSPITAL_COMMUNITY): Payer: Self-pay | Admitting: Emergency Medicine

## 2013-12-18 ENCOUNTER — Emergency Department (HOSPITAL_COMMUNITY)
Admission: EM | Admit: 2013-12-18 | Discharge: 2013-12-18 | Disposition: A | Discharge status: Home / Self Care | Payer: Medicare Other | Attending: Emergency Medicine | Admitting: Emergency Medicine

## 2013-12-18 ENCOUNTER — Emergency Department (HOSPITAL_COMMUNITY): Payer: Medicare Other

## 2013-12-18 DIAGNOSIS — Z8659 Personal history of other mental and behavioral disorders: Secondary | ICD-10-CM | POA: Diagnosis not present

## 2013-12-18 DIAGNOSIS — Z792 Long term (current) use of antibiotics: Secondary | ICD-10-CM | POA: Diagnosis not present

## 2013-12-18 DIAGNOSIS — F17200 Nicotine dependence, unspecified, uncomplicated: Secondary | ICD-10-CM | POA: Diagnosis not present

## 2013-12-18 DIAGNOSIS — N390 Urinary tract infection, site not specified: Secondary | ICD-10-CM | POA: Diagnosis not present

## 2013-12-18 DIAGNOSIS — Z86711 Personal history of pulmonary embolism: Secondary | ICD-10-CM | POA: Diagnosis not present

## 2013-12-18 DIAGNOSIS — G8929 Other chronic pain: Secondary | ICD-10-CM | POA: Insufficient documentation

## 2013-12-18 DIAGNOSIS — Z8739 Personal history of other diseases of the musculoskeletal system and connective tissue: Secondary | ICD-10-CM | POA: Insufficient documentation

## 2013-12-18 DIAGNOSIS — M545 Low back pain, unspecified: Secondary | ICD-10-CM | POA: Insufficient documentation

## 2013-12-18 DIAGNOSIS — N200 Calculus of kidney: Secondary | ICD-10-CM

## 2013-12-18 DIAGNOSIS — J4489 Other specified chronic obstructive pulmonary disease: Secondary | ICD-10-CM | POA: Insufficient documentation

## 2013-12-18 DIAGNOSIS — Z3202 Encounter for pregnancy test, result negative: Secondary | ICD-10-CM | POA: Diagnosis not present

## 2013-12-18 DIAGNOSIS — J449 Chronic obstructive pulmonary disease, unspecified: Secondary | ICD-10-CM | POA: Insufficient documentation

## 2013-12-18 DIAGNOSIS — Z9889 Other specified postprocedural states: Secondary | ICD-10-CM | POA: Insufficient documentation

## 2013-12-18 LAB — URINALYSIS, ROUTINE W REFLEX MICROSCOPIC
Bilirubin Urine: NEGATIVE
Glucose, UA: NEGATIVE mg/dL
Ketones, ur: NEGATIVE mg/dL
Nitrite: POSITIVE — AB
Specific Gravity, Urine: 1.01 (ref 1.005–1.030)
Urobilinogen, UA: 0.2 mg/dL (ref 0.0–1.0)
pH: 6.5 (ref 5.0–8.0)

## 2013-12-18 LAB — URINE MICROSCOPIC-ADD ON

## 2013-12-18 LAB — PREGNANCY, URINE: Preg Test, Ur: NEGATIVE

## 2013-12-18 MED ORDER — SODIUM CHLORIDE 0.9 % IV SOLN
INTRAVENOUS | Status: DC
Start: 2013-12-18 — End: 2013-12-18

## 2013-12-18 MED ORDER — NAPROXEN 375 MG PO TABS: 375.0000 mg | ORAL_TABLET | Freq: Two times a day (BID) | ORAL | Status: DC

## 2013-12-18 MED ORDER — KETOROLAC TROMETHAMINE 60 MG/2ML IM SOLN
60.0000 mg | Freq: Once | INTRAMUSCULAR | Status: AC
  Administered 2013-12-18: 60 mg via INTRAMUSCULAR
  Filled 2013-12-18: qty 2

## 2013-12-18 MED ORDER — PROMETHAZINE HCL 25 MG/ML IJ SOLN
12.5000 mg | Freq: Once | INTRAMUSCULAR | Status: DC
  Filled 2013-12-18: qty 1

## 2013-12-18 MED ORDER — PROMETHAZINE HCL 25 MG/ML IJ SOLN
12.5000 mg | Freq: Once | INTRAMUSCULAR | Status: AC
  Administered 2013-12-18: 12.5 mg via INTRAMUSCULAR

## 2013-12-18 MED ORDER — OXYCODONE-ACETAMINOPHEN 5-325 MG PO TABS: 1.0000 | ORAL_TABLET | Freq: Once | ORAL | Status: DC

## 2013-12-18 MED ORDER — SULFAMETHOXAZOLE-TRIMETHOPRIM 800-160 MG PO TABS: 1.0000 | ORAL_TABLET | Freq: Two times a day (BID) | ORAL | Status: AC

## 2013-12-18 MED ORDER — KETOROLAC TROMETHAMINE 30 MG/ML IJ SOLN
30.0000 mg | Freq: Once | INTRAMUSCULAR | Status: DC
  Filled 2013-12-18: qty 1

## 2013-12-18 NOTE — ED Notes (Signed)
Lower back pain, worse on right side, starting yesterday afternoon.  Denies strenuous exercising/heavy lifting.

## 2013-12-18 NOTE — ED Notes (Signed)
Multiple attempts to start IV. Pt admits to IV drug use. Multiple bruises, scarring and puncture sites over peripheral veins to BUE and feet. Orders for meds to be given IM.

## 2013-12-18 NOTE — Discharge Instructions (Signed)
Your urine shows that you have a urinary tract infection. We are treating the infection with antibiotics.   Your CT scan shows that you appear to has recently passed a kidney stone. That is most likely the reason for the pain that you were having.   Take your medications as directed and follow up with your doctor.

## 2013-12-18 NOTE — ED Provider Notes (Signed)
CSN: [id number]     Arrival date & time 12/18/13  9390 History   First MD Initiated Contact with Patient 12/18/13 1103     Chief Complaint  Patient presents with  . Back Pain     (Consider location/radiation/quality/duration/timing/severity/associated sxs/prior Treatment) Patient is a 43 y.o. female presenting with back pain. The history is provided by the patient.  Back Pain Location:  Lumbar spine Radiates to:  R posterior upper leg Pain severity:  Severe Onset quality:  Gradual Duration:  1 day Timing:  Constant Progression:  Worsening Chronicity:  New Relieved by:  Nothing Worsened by:  Ambulation and movement Ineffective treatments:  Ibuprofen Associated symptoms: abdominal pain and dysuria   Associated symptoms: no bladder incontinence, no bowel incontinence, no chest pain, no fever and no headaches    AIRYONNA FRANKLYN is a 43 y.o. female who presents to the ED with low back pain that started yesterday. She does not know of any injury. Patient reports pain and pressure with urination.   Past Medical History  Diagnosis Date  . COPD (chronic obstructive pulmonary disease)   . Anxiety   . Panic attacks   . Back pain   . Pulmonary embolism   . Chronic back pain   . DDD (degenerative disc disease)    Past Surgical History  Procedure Laterality Date  . Cesarean section    . Nose surgery    . Tubes tied     Family History  Problem Relation Age of Onset  . Arthritis    . Lung disease    . Cancer    . Asthma     History  Substance Use Topics  . Smoking status: Current Every Day Smoker -- 0.50 packs/day    Types: Cigarettes  . Smokeless tobacco: Not on file  . Alcohol Use: No   OB History   Grav Para Term Preterm Abortions TAB SAB Ect Mult Living                 Review of Systems  Constitutional: Negative for fever and chills.  HENT: Negative.   Eyes: Negative for visual disturbance.  Respiratory: Negative for cough and wheezing.   Cardiovascular: Negative  for chest pain and palpitations.  Gastrointestinal: Positive for nausea and abdominal pain. Negative for vomiting and bowel incontinence.  Genitourinary: Positive for dysuria and frequency. Negative for bladder incontinence and urgency.  Musculoskeletal: Positive for back pain.  Skin: Negative for rash.  Neurological: Negative for light-headedness and headaches.  Psychiatric/Behavioral: Negative for confusion. The patient is not nervous/anxious.       Allergies  Doxycycline; Pyridium; and Tramadol  Home Medications   Prior to Admission medications   Medication Sig Start Date End Date Taking? Authorizing Provider  cephALEXin (KEFLEX) 500 MG capsule Take 1 capsule (500 mg total) by mouth 4 (four) times daily. 12/01/13   Ezequiel Essex, MD  HYDROcodone-acetaminophen (NORCO/VICODIN) 5-325 MG per tablet 1 or 2 po q4h prn pain 12/01/13   Lenox Ahr, PA-C  sulfamethoxazole-trimethoprim (BACTRIM DS) 800-160 MG per tablet Take 1 tablet by mouth 2 (two) times daily. 12/01/13   Lenox Ahr, PA-C   BP 106/67  Pulse 91  Temp(Src) 98.3 F (36.8 C) (Oral)  Resp 16  Ht 5' (1.524 m)  Wt 210 lb (95.255 kg)  BMI 41.01 kg/m2  SpO2 100%  LMP 11/12/2013 Physical Exam  Nursing note and vitals reviewed. Constitutional: She is oriented to person, place, and time. She appears well-developed and well-nourished.  HENT:  Head: Normocephalic.  Eyes: EOM are normal.  Neck: Neck supple.  Cardiovascular: Normal rate.   Pulmonary/Chest: Effort normal.  Abdominal: Soft. There is tenderness in the right lower quadrant and suprapubic area.  Right flank pain with palpation   Musculoskeletal: Normal range of motion.  Bilateral upper extremities with scaring and bruising from IV drug use.   Neurological: She is alert and oriented to person, place, and time. No cranial nerve deficit.  Skin: Skin is warm and dry.  Psychiatric: She has a normal mood and affect. Her behavior is normal.   Results for  orders placed during the hospital encounter of 12/18/13 (from the past 24 hour(s))  URINALYSIS, ROUTINE W REFLEX MICROSCOPIC     Status: Abnormal   Collection Time    12/18/13 11:39 AM      Result Value Ref Range   Color, Urine YELLOW  YELLOW   APPearance HAZY (*) CLEAR   Specific Gravity, Urine 1.010  1.005 - 1.030   pH 6.5  5.0 - 8.0   Glucose, UA NEGATIVE  NEGATIVE mg/dL   Hgb urine dipstick MODERATE (*) NEGATIVE   Bilirubin Urine NEGATIVE  NEGATIVE   Ketones, ur NEGATIVE  NEGATIVE mg/dL   Protein, ur TRACE (*) NEGATIVE mg/dL   Urobilinogen, UA 0.2  0.0 - 1.0 mg/dL   Nitrite POSITIVE (*) NEGATIVE   Leukocytes, UA SMALL (*) NEGATIVE  URINE MICROSCOPIC-ADD ON     Status: Abnormal   Collection Time    12/18/13 11:39 AM      Result Value Ref Range   Squamous Epithelial / LPF FEW (*) RARE   WBC, UA TOO NUMEROUS TO COUNT  <3 WBC/hpf   RBC / HPF TOO NUMEROUS TO COUNT  <3 RBC/hpf   Bacteria, UA MANY (*) RARE   Ct Abdomen Pelvis Wo Contrast  12/18/2013   CLINICAL DATA:  Right-sided flank pain, hematuria  EXAM: CT ABDOMEN AND PELVIS WITHOUT CONTRAST  TECHNIQUE: Multidetector CT imaging of the abdomen and pelvis was performed following the standard protocol without IV contrast.  COMPARISON:  None.  FINDINGS: The lung bases are within normal limits. The liver, gallbladder, spleen, adrenal glands and pancreas are within normal limits. The left kidney is within normal limits. No mass lesion or hydronephrosis is noted. The right kidney shows mild perinephric stranding and mild edema. No significant obstructive change is noted. These changes may be related to edema from recently passed stone. A phlebolith is noted adjacent to the bladder on the right stable from the prior exam.  The appendix is within normal limits. A right ovarian cyst is seen measuring 4.5 cm in greatest dimension. The bladder is well distended. No pelvic mass lesion or sidewall adenopathy is seen. No acute bony abnormality is noted.   IMPRESSION: Mild changes in the right kidney likely related to edema from recently passed stone. No definitive obstructive process is seen.  No other focal abnormality is noted.   Electronically Signed   By: Inez Catalina M.D.   On: 12/18/2013 13:27    ED Course  Procedures ( Labs, CT, pain management. MDM  43 y.o. female with right flank pain and UTI symptoms that have improved during her ED visit. I have reviewed this patient's vital signs, nurses notes, appropriate labs and imaging.  I have discussed findings and plan of care with the patient and she voices understanding and agrees to treatment plan. Stable for discharge and has most likely passed the stone according to CT and pain  improvement of the patient. Will treat for UTI. She will follow up with her PCP or return here as needed.    Medication List    TAKE these medications       naproxen 375 MG tablet  Commonly known as:  NAPROSYN  Take 1 tablet (375 mg total) by mouth 2 (two) times daily.     sulfamethoxazole-trimethoprim 800-160 MG per tablet  Commonly known as:  BACTRIM DS,SEPTRA DS  Take 1 tablet by mouth 2 (two) times daily.      ASK your doctor about these medications       ibuprofen 200 MG tablet  Commonly known as:  ADVIL,MOTRIN  Take 200 mg by mouth every 6 (six) hours as needed for moderate pain.           [place], [place] 12/18/13 [phone]

## 2013-12-19 NOTE — ED Provider Notes (Signed)
Medical screening examination/treatment/procedure(s) were conducted as a shared visit with non-physician practitioner(s) and myself.  I personally evaluated the patient during the encounter.   EKG Interpretation None     Suspect right sided kidney stone c UTI.   UA shows TNTC rbc's, wbc's.  Nat Christen, MD 12/19/13 [phone]

## 2014-06-15 ENCOUNTER — Encounter (HOSPITAL_COMMUNITY): Payer: Self-pay | Admitting: Emergency Medicine

## 2014-06-15 ENCOUNTER — Emergency Department (HOSPITAL_COMMUNITY)
Admission: EM | Admit: 2014-06-15 | Discharge: 2014-06-15 | Disposition: A | Discharge status: Other Acute Inpatient Hospital | Payer: Medicare Other | Source: Home / Self Care | Attending: Emergency Medicine | Admitting: Emergency Medicine

## 2014-06-15 DIAGNOSIS — F329 Major depressive disorder, single episode, unspecified: Secondary | ICD-10-CM | POA: Diagnosis not present

## 2014-06-15 DIAGNOSIS — F1910 Other psychoactive substance abuse, uncomplicated: Secondary | ICD-10-CM

## 2014-06-15 DIAGNOSIS — F1123 Opioid dependence with withdrawal: Secondary | ICD-10-CM

## 2014-06-15 DIAGNOSIS — F1193 Opioid use, unspecified with withdrawal: Secondary | ICD-10-CM

## 2014-06-15 DIAGNOSIS — R45851 Suicidal ideations: Secondary | ICD-10-CM

## 2014-06-15 HISTORY — DX: Pain in unspecified knee: M25.569

## 2014-06-15 HISTORY — DX: Other psychoactive substance abuse, uncomplicated: F19.10

## 2014-06-15 HISTORY — DX: Other chronic pain: G89.29

## 2014-06-15 LAB — COMPREHENSIVE METABOLIC PANEL
ALT: 92 U/L — ABNORMAL HIGH (ref 0–35)
AST: 62 U/L — ABNORMAL HIGH (ref 0–37)
Albumin: 3.5 g/dL (ref 3.5–5.2)
Alkaline Phosphatase: 52 U/L (ref 39–117)
Anion gap: 5 (ref 5–15)
BUN: 10 mg/dL (ref 6–23)
CO2: 25 mmol/L (ref 19–32)
Calcium: 8.6 mg/dL (ref 8.4–10.5)
Chloride: 108 mmol/L (ref 96–112)
Creatinine, Ser: 0.81 mg/dL (ref 0.50–1.10)
GFR calc Af Amer: >90 mL/min (ref >90)
GFR calc non Af Amer: 88 mL/min — ABNORMAL LOW (ref >90)
Glucose, Bld: 104 mg/dL — ABNORMAL HIGH (ref 70–99)
Potassium: 3.3 mmol/L — ABNORMAL LOW (ref 3.5–5.1)
Sodium: 138 mmol/L (ref 135–145)
Total Bilirubin: 0.2 mg/dL — ABNORMAL LOW (ref 0.3–1.2)
Total Protein: 7.9 g/dL (ref 6.0–8.3)

## 2014-06-15 LAB — CBC WITH DIFFERENTIAL/PLATELET
Band Neutrophils: 0 % (ref 0–10)
Basophils Absolute: 0 10*3/uL (ref 0.0–0.1)
Basophils Relative: 0 % (ref 0–1)
Blasts: 0 %
Eosinophils Absolute: 0.3 10*3/uL (ref 0.0–0.7)
Eosinophils Relative: 3 % (ref 0–5)
HCT: 38.8 % (ref 36.0–46.0)
Hemoglobin: 13.2 g/dL (ref 12.0–15.0)
Lymphocytes Relative: 53 % — ABNORMAL HIGH (ref 12–46)
Lymphs Abs: 4.5 10*3/uL — ABNORMAL HIGH (ref 0.7–4.0)
MCH: 29.9 pg (ref 26.0–34.0)
MCHC: 34 g/dL (ref 30.0–36.0)
MCV: 88 fL (ref 78.0–100.0)
Metamyelocytes Relative: 0 %
Monocytes Absolute: 0.5 10*3/uL (ref 0.1–1.0)
Monocytes Relative: 6 % (ref 3–12)
Myelocytes: 0 %
Neutro Abs: 3.2 10*3/uL (ref 1.7–7.7)
Neutrophils Relative %: 38 % — ABNORMAL LOW (ref 43–77)
Platelets: 317 10*3/uL (ref 150–400)
Promyelocytes Absolute: 0 %
RBC: 4.41 MIL/uL (ref 3.87–5.11)
RDW: 14.7 % (ref 11.5–15.5)
WBC: 8.5 10*3/uL (ref 4.0–10.5)
nRBC: 0 /100 WBC

## 2014-06-15 LAB — RAPID URINE DRUG SCREEN, HOSP PERFORMED
Amphetamines: NOT DETECTED
Barbiturates: NOT DETECTED
Benzodiazepines: NOT DETECTED
Cocaine: POSITIVE — AB
Opiates: NOT DETECTED
Tetrahydrocannabinol: NOT DETECTED

## 2014-06-15 LAB — ETHANOL: Alcohol, Ethyl (B): <5 mg/dL (ref 0–9)

## 2014-06-15 MED ORDER — ACETAMINOPHEN 325 MG PO TABS
650.0000 mg | ORAL_TABLET | ORAL | Status: DC | PRN
  Administered 2014-06-15: 650 mg via ORAL
  Filled 2014-06-15: qty 2

## 2014-06-15 MED ORDER — DICYCLOMINE HCL 20 MG PO TABS
20.0000 mg | ORAL_TABLET | Freq: Four times a day (QID) | ORAL | Status: DC | PRN
  Filled 2014-06-15: qty 1

## 2014-06-15 MED ORDER — NAPROXEN 250 MG PO TABS: 500.0000 mg | ORAL_TABLET | Freq: Two times a day (BID) | ORAL | Status: DC | PRN

## 2014-06-15 MED ORDER — HYDROXYZINE HCL 25 MG PO TABS
25.0000 mg | ORAL_TABLET | Freq: Four times a day (QID) | ORAL | Status: DC | PRN
  Administered 2014-06-15: 25 mg via ORAL
  Filled 2014-06-15: qty 1

## 2014-06-15 MED ORDER — IBUPROFEN 400 MG PO TABS: 400.0000 mg | ORAL_TABLET | Freq: Three times a day (TID) | ORAL | Status: DC | PRN

## 2014-06-15 MED ORDER — CLONIDINE HCL 0.1 MG PO TABS
0.1000 mg | ORAL_TABLET | ORAL | Status: DC
Start: 2014-06-18 — End: 2014-06-16

## 2014-06-15 MED ORDER — LORAZEPAM 1 MG PO TABS
1.0000 mg | ORAL_TABLET | Freq: Three times a day (TID) | ORAL | Status: DC | PRN
  Administered 2014-06-15 (×2): 1 mg via ORAL
  Filled 2014-06-15 (×2): qty 1

## 2014-06-15 MED ORDER — CLONIDINE HCL 0.1 MG PO TABS
0.1000 mg | ORAL_TABLET | Freq: Four times a day (QID) | ORAL | Status: DC
  Administered 2014-06-15: 0.1 mg via ORAL
  Filled 2014-06-15: qty 1

## 2014-06-15 MED ORDER — METHOCARBAMOL 500 MG PO TABS
500.0000 mg | ORAL_TABLET | Freq: Three times a day (TID) | ORAL | Status: DC | PRN
  Administered 2014-06-15: 500 mg via ORAL
  Filled 2014-06-15: qty 1

## 2014-06-15 MED ORDER — ALUM & MAG HYDROXIDE-SIMETH 200-200-20 MG/5ML PO SUSP: 30.0000 mL | ORAL | Status: DC | PRN

## 2014-06-15 MED ORDER — ONDANSETRON HCL 4 MG PO TABS
4.0000 mg | ORAL_TABLET | Freq: Three times a day (TID) | ORAL | Status: DC | PRN
  Administered 2014-06-15: 4 mg via ORAL
  Filled 2014-06-15: qty 1

## 2014-06-15 MED ORDER — NICOTINE 21 MG/24HR TD PT24
21.0000 mg | MEDICATED_PATCH | Freq: Every day | TRANSDERMAL | Status: DC | PRN
  Filled 2014-06-15: qty 1

## 2014-06-15 MED ORDER — LOPERAMIDE HCL 2 MG PO CAPS: 2.0000 mg | ORAL_CAPSULE | ORAL | Status: DC | PRN

## 2014-06-15 MED ORDER — POTASSIUM CHLORIDE CRYS ER 20 MEQ PO TBCR: 40.0000 meq | EXTENDED_RELEASE_TABLET | Freq: Once | ORAL | Status: DC

## 2014-06-15 MED ORDER — ZOLPIDEM TARTRATE 5 MG PO TABS
5.0000 mg | ORAL_TABLET | Freq: Every evening | ORAL | Status: DC | PRN
  Administered 2014-06-15: 5 mg via ORAL
  Filled 2014-06-15: qty 1

## 2014-06-15 MED ORDER — CLONIDINE HCL 0.1 MG PO TABS: 0.1000 mg | ORAL_TABLET | Freq: Every day | ORAL | Status: DC

## 2014-06-15 MED ORDER — ONDANSETRON 4 MG PO TBDP: 4.0000 mg | ORAL_TABLET | Freq: Four times a day (QID) | ORAL | Status: DC | PRN

## 2014-06-15 NOTE — BH Assessment (Signed)
Dr. Thurnell Garbe reports Pt was threatening to leave and has been placed under IVC due to threats of suicide by overdose. [name], [facility], confirms bed availability. Gave clinical report to Arlester Marker, NP who sais Pt now meets dual-diagnosis criteria and accepts Pt to the service of Dr. Carlton Adam, room 306-2. Notified Dr. Thurnell Garbe and Rusty Aus, RN of acceptance.   [name], [facility] Triage Specialist [phone]

## 2014-06-15 NOTE — ED Notes (Signed)
Pt given graham crackers, peanut butter and coke to drink and a warm blanket.  Lights cut off for pt comfort

## 2014-06-15 NOTE — ED Notes (Signed)
Patient requesting help with detox. Per patient uses Morry Veiga meth, heroin, pain pills, cocaine, and "molly"-states "what ever I can get my hands on." Patient denies SI or HI-states "I just want the high." Per cousin patient got extremely depressed yesterday and was talking about "killing herself." Per cousin was told to bring patient here so that she can be referred to [facility].

## 2014-06-15 NOTE — BH Assessment (Signed)
Assessment Note  [name] is an 44 y.o. female. Pt reports she is seeking help for drug use.  She came to [place] with her cousin after they drove to [facility] in [place], but were not admitted there.  Pt reports she "flipped out" yesterday due to not being able to locate any drugs and tried to kill herself by jumping in front of a car.  Pt reports she mainly uses multiple opiates: heroin and opana by injection, but also roxycontin and dilaudid.  If she cannot get opiates she uses methamphetamine or cocaine.  She reports her last use of opiates and cocaine as last night, but her UDS was only positive for cocaine.  Pt reports SI "sometimes" but when pressed for clarification during assessment, pt reported that if she was not admitted today, she was going to intentionally overdose to kill herself.  Pt denied HI/AV. Pt and her cousin were both upset that they had driven all the way to [place] and were somewhat upset.   Axis I: Substance Abuse Axis II: Deferred Axis III:  Past Medical History  Diagnosis Date  . COPD (chronic obstructive pulmonary disease)   . Anxiety   . Panic attacks   . Pulmonary embolism   . Chronic back pain   . DDD (degenerative disc disease)   . Chronic knee pain   . Polysubstance abuse     cocaine, opiates, marijuana, amphetamines, "molly," crystal meth   Axis IV: problems with primary support group Axis V: 41-50 serious symptoms  Past Medical History:  Past Medical History  Diagnosis Date  . COPD (chronic obstructive pulmonary disease)   . Anxiety   . Panic attacks   . Pulmonary embolism   . Chronic back pain   . DDD (degenerative disc disease)   . Chronic knee pain   . Polysubstance abuse     cocaine, opiates, marijuana, amphetamines, "molly," crystal meth    Past Surgical History  Procedure Laterality Date  . Cesarean section    . Nose surgery    . Tubes tied      Family History:  Family History  Problem Relation Age of Onset  . Arthritis     . Lung disease    . Cancer    . Asthma      Social History:  reports that she has been smoking Cigarettes.  She has a 12.5 pack-year smoking history. She has never used smokeless tobacco. She reports that she uses illicit drugs (IV, Cocaine, Marijuana, and Methamphetamines). She reports that she does not drink alcohol.  Additional Social History:  Alcohol / Drug Use Pain Medications: yes Prescriptions: yes History of alcohol / drug use?: Yes Substance #1 Name of Substance 1: opiates: heroin, opana, roxy, dilaudid 1 - Age of First Use: 42 1 - Amount (size/oz): $50-$80 heroin or 2 40mg  opana 1 - Frequency: daily 1 - Duration: 1 year 1 - Last Use / Amount: 3/4 Substance #2 Name of Substance 2: meth 2 - Age of First Use: 41 2 - Frequency: <1x week, when can't get opiates 2 - Last Use / Amount: 2 weeks ago Substance #3 Name of Substance 3: cocaine 3 - Age of First Use: 28 3 - Amount (size/oz): $200 3 - Frequency: 1x week 3 - Last Use / Amount: 3/4  CIWA: CIWA-Ar BP: 122/77 mmHg Pulse Rate: 71 COWS: Clinical Opiate Withdrawal Scale (COWS) Resting Pulse Rate: Pulse Rate 80 or below Sweating: Subjective report of chills or flushing Restlessness: Reports  difficulty sitting still, but is able to do so Pupil Size: Pupils pinned or normal size for room light Bone or Joint Aches: Mild diffuse discomfort Runny Nose or Tearing: Nasal stuffiness or unusually moist eyes GI Upset: Vomiting or diarrhea Tremor: No tremor Yawning: No yawning Anxiety or Irritability: Patient reports increasing irritability or anxiousness Gooseflesh Skin: Skin is smooth COWS Total Score: 8  Allergies:  Allergies  Allergen Reactions  . Doxycycline Other (See Comments)    burns  . Pyridium [Phenazopyridine Hcl] Other (See Comments)    Irritation to skin  . Tramadol Nausea And Vomiting    Home Medications:  (Not in a hospital admission)  OB/GYN Status:  Patient's last menstrual period was  06/01/2014.  General Assessment Data Location of Assessment: [facility] ACT Assessment: Yes Is this a Tele or Face-to-Face Assessment?: Tele Assessment Is this an Initial Assessment or a Re-assessment for this encounter?: Initial Assessment Can pt return to current living arrangement?: Yes Admission Status: Voluntary Is patient capable of signing voluntary admission?: Yes Transfer from: [facility] Referral Source: Self/Family/Friend           Risk to self with the past 6 months Suicidal Ideation: Yes-Currently Present Suicidal Intent: No Is patient at risk for suicide?: Yes Suicidal Plan?: Yes-Currently Present Specify Current Suicidal Plan: intentional overdose Access to Means: Yes Specify Access to Suicidal Means: drugs are available What has been your use of drugs/alcohol within the last 12 months?: current significant use Previous Attempts/Gestures: Yes How many times?: 1 Triggers for Past Attempts: Other (Comment) (current situation-pt reports she intentionally jumped in fro) Intentional Self Injurious Behavior: None Family Suicide History: Yes ("several relatives") Recent stressful life event(s): Conflict (Comment) Persecutory voices/beliefs?: No Depression: No Substance abuse history and/or treatment for substance abuse?: Yes Suicide prevention information given to non-admitted patients: Not applicable  Risk to Others within the past 6 months Homicidal Ideation: No Thoughts of Harm to Others: No Current Homicidal Intent: No Current Homicidal Plan: No Access to Homicidal Means: No History of harm to others?: No Assessment of Violence: In past 6-12 months Violent Behavior Description: assault charge one year ago Does patient have access to weapons?: No Criminal Charges Pending?: No Does patient have a court date: No  Psychosis Hallucinations: None noted Delusions: None noted  Mental Status Report Appear/Hygiene: Disheveled Eye Contact: Good Motor  Activity: Agitation Speech: Logical/coherent, Argumentative Level of Consciousness: Alert Mood: Irritable (agitated) Affect: Irritable Anxiety Level: Minimal Thought Processes: Relevant Judgement: Unimpaired Orientation: Person, Place, Time, Situation Obsessive Compulsive Thoughts/Behaviors: None  Cognitive Functioning Concentration: Normal Memory: Recent Intact, Remote Intact IQ: Average Insight: Fair Impulse Control: Poor Appetite:  (erratic) Weight Loss: 100 Sleep:  (erratic) Total Hours of Sleep:  (varies) Vegetative Symptoms: None  ADLScreening [facility] Assessment Services) Patient's cognitive ability adequate to safely complete daily activities?: Yes Patient able to express need for assistance with ADLs?: Yes Independently performs ADLs?: Yes (appropriate for developmental age)  Prior Inpatient Therapy Prior Inpatient Therapy: Yes Prior Therapy Dates: 1990s Prior Therapy Facilty/Provider(s): [facility] Reason for Treatment: cocaine  Prior Outpatient Therapy Prior Outpatient Therapy: Yes Prior Therapy Dates: 2014 Prior Therapy Facilty/Provider(s): [facility] Reason for Treatment: mental health  ADL Screening (condition at time of admission) Patient's cognitive ability adequate to safely complete daily activities?: Yes Patient able to express need for assistance with ADLs?: Yes Independently performs ADLs?: Yes (appropriate for developmental age)             Advance Directives (For Healthcare) Does patient have an  advance directive?: No Would patient like information on creating an advanced directive?: No - patient declined information    Additional Information 1:1 In Past 12 Months?: No CIRT Risk: No Elopement Risk: No Does patient have medical clearance?: Yes     Disposition: Discussed pt with Catalina Pizza who recommends residential substance abuse admission be pursued and that pt be reassessed in AM regarding SI.  Discussed with Dr Thurnell Garbe at  [facility] who agrees that pt be reevaluated for SI in the AM. Disposition Initial Assessment Completed for this Encounter: Yes Disposition of Patient: Other dispositions  On Site Evaluation by:   Reviewed with Physician:    Joanne Chars 06/15/2014 5:15 PM

## 2014-06-15 NOTE — ED Notes (Signed)
Report given to Genia Hotter at [facility]

## 2014-06-15 NOTE — ED Notes (Signed)
Pt called out to nurses' station multiple times asking for nurse. Said nurse went in to see pt and pt stated she felt like "I have all this electricity in my legs"  Referring to her restless leg; pt asking how much longer will she be here and pt informed she would be here until a room is available.  Pt states "I am not suicidal, my family told me to say it"  Pt was told that once she says she is suicidal, regardless if she means it or not, she has to stay and have a Actuary.

## 2014-06-15 NOTE — ED Provider Notes (Signed)
CSN: [id number]     Arrival date & time 06/15/14  1554 History   First MD Initiated Contact with Patient 06/15/14 1615     Chief Complaint  Patient presents with  . Medical Clearance    HPI Per pt, c/o gradual onset and persistence of constant polysubstance abuse for the past several years. LD opiates by injection yesterday.  Pt states she "just wants the high." Pt's cousin drove her to to a treatment facility in [place] yesterday and "got turned away." Pt's cousin that pt was "depressed" and "started talking about killing herself." Pt states she "wanted drugs" and "was going to jump in front of a car." Pt continues to endorse SI, stating she "needs to be admitted" or she will "go out and overdose to kill herself." Pt c/o withdrawal symptoms, including: aching all over, nausea, cramping, diarrhea. Denies SA, no HI, no hallucinations.     Past Medical History  Diagnosis Date  . COPD (chronic obstructive pulmonary disease)   . Anxiety   . Panic attacks   . Pulmonary embolism   . Chronic back pain   . DDD (degenerative disc disease)   . Chronic knee pain   . Polysubstance abuse     cocaine, opiates, marijuana, amphetamines, "molly," crystal meth   Past Surgical History  Procedure Laterality Date  . Cesarean section    . Nose surgery    . Tubes tied     Family History  Problem Relation Age of Onset  . Arthritis    . Lung disease    . Cancer    . Asthma     History  Substance Use Topics  . Smoking status: Current Every Day Smoker -- 0.50 packs/day for 25 years    Types: Cigarettes  . Smokeless tobacco: Never Used  . Alcohol Use: No   OB History    Gravida Para Term Preterm AB TAB SAB Ectopic Multiple Living   2 2 2       2      Review of Systems ROS: Statement: All systems negative except as marked or noted in the HPI; Constitutional: Negative for fever and chills. +aching all over; ; Eyes: Negative for eye pain, redness and discharge. ; ; ENMT: Negative for ear pain,  hoarseness, nasal congestion, sinus pressure and sore throat. ; ; Cardiovascular: Negative for chest pain, palpitations, diaphoresis, dyspnea and peripheral edema. ; ; Respiratory: Negative for cough, wheezing and stridor. ; ; Gastrointestinal: +nausea, cramping, diarrhea. Negative for vomiting, abdominal pain, blood in stool, hematemesis, jaundice and rectal bleeding. . ; ; Genitourinary:  Negative for dysuria, flank pain and hematuria. ; ; Musculoskeletal: Negative for back pain and neck pain. Negative for swelling and trauma.; ; Skin: Negative for pruritus, rash, abrasions, blisters, bruising and skin lesion.; ; Neuro: Negative for headache, lightheadedness and neck stiffness. Negative for weakness, altered level of consciousness , altered mental status, extremity weakness, paresthesias, involuntary movement, seizure and syncope.; Psych:  +SI. No SA, no HI, no hallucinations.      Allergies  Doxycycline; Pyridium; and Tramadol  Home Medications   Prior to Admission medications   Medication Sig Start Date End Date Taking? Authorizing Provider  Aspirin-Salicylamide-Caffeine 601-09-32 MG TABS Take 1 packet by mouth daily as needed (FOR PAIN).   Yes Historical Provider, MD  ibuprofen (ADVIL,MOTRIN) 200 MG tablet Take 200 mg by mouth every 6 (six) hours as needed for moderate pain.    Historical Provider, MD   BP 122/77 mmHg  Pulse 71  Temp(Src) 98.2 F (36.8 C) (Oral)  Resp 18  Ht 5\' 1"  (1.549 m)  Wt 182 lb 1.6 oz (82.6 kg)  BMI 34.43 kg/m2  SpO2 100%  LMP 06/01/2014 Physical Exam  Physical examination:  Nursing notes reviewed; Vital signs and O2 SAT reviewed;  Constitutional: Well developed, Well nourished, Well hydrated, In no acute distress; Head:  Normocephalic, atraumatic; Eyes: EOMI, PERRL, No scleral icterus; ENMT:  Mucous membranes moist; Neck: Supple, Full range of motion; Cardiovascular: Regular rate and rhythm; Respiratory: Breath sounds clear, No wheezes. Speaking full sentences  with ease, Normal respiratory effort/excursion; Chest: Movement normal; Abdomen: Nondistended;; Extremities: No deformity, No edema.; Neuro: AA&Ox3, Major CN grossly intact. Speech clear. No obvious gross focal motor deficits in extremities. Climbs on and off stretcher easily by herself. Gait steady.; Skin: Color normal, Warm, Dry.; Psych:  Irritable.     ED Course  Procedures     EKG Interpretation None      MDM  MDM Reviewed: nursing note, vitals and previous chart Interpretation: labs     Results for orders placed or performed during the hospital encounter of 06/15/14  CBC WITH DIFFERENTIAL  Result Value Ref Range   WBC 8.5 4.0 - 10.5 K/uL   RBC 4.41 3.87 - 5.11 MIL/uL   Hemoglobin 13.2 12.0 - 15.0 g/dL   HCT 38.8 36.0 - 46.0 %   MCV 88.0 78.0 - 100.0 fL   MCH 29.9 26.0 - 34.0 pg   MCHC 34.0 30.0 - 36.0 g/dL   RDW 14.7 11.5 - 15.5 %   Platelets 317 150 - 400 K/uL   Neutrophils Relative % PENDING 43 - 77 %   Neutro Abs PENDING 1.7 - 7.7 K/uL   Band Neutrophils PENDING 0 - 10 %   Lymphocytes Relative PENDING 12 - 46 %   Lymphs Abs PENDING 0.7 - 4.0 K/uL   Monocytes Relative PENDING 3 - 12 %   Monocytes Absolute PENDING 0.1 - 1.0 K/uL   Eosinophils Relative PENDING 0 - 5 %   Eosinophils Absolute PENDING 0.0 - 0.7 K/uL   Basophils Relative PENDING 0 - 1 %   Basophils Absolute PENDING 0.0 - 0.1 K/uL   WBC Morphology PENDING    RBC Morphology PENDING    Smear Review PENDING    nRBC PENDING 0 /100 WBC   Metamyelocytes Relative PENDING %   Myelocytes PENDING %   Promyelocytes Absolute PENDING %   Blasts PENDING %  Comprehensive metabolic panel  Result Value Ref Range   Sodium 138 135 - 145 mmol/L   Potassium 3.3 (L) 3.5 - 5.1 mmol/L   Chloride 108 96 - 112 mmol/L   CO2 25 19 - 32 mmol/L   Glucose, Bld 104 (H) 70 - 99 mg/dL   BUN 10 6 - 23 mg/dL   Creatinine, Ser 0.81 0.50 - 1.10 mg/dL   Calcium 8.6 8.4 - 10.5 mg/dL   Total Protein 7.9 6.0 - 8.3 g/dL   Albumin  3.5 3.5 - 5.2 g/dL   AST 62 (H) 0 - 37 U/L   ALT 92 (H) 0 - 35 U/L   Alkaline Phosphatase 52 39 - 117 U/L   Total Bilirubin 0.2 (L) 0.3 - 1.2 mg/dL   GFR calc non Af Amer 88 (L) >90 mL/min   GFR calc Af Amer >90 >90 mL/min   Anion gap 5 5 - 15  Drug screen panel, emergency  Result Value Ref Range   Opiates NONE DETECTED NONE DETECTED  Cocaine POSITIVE (A) NONE DETECTED   Benzodiazepines NONE DETECTED NONE DETECTED   Amphetamines NONE DETECTED NONE DETECTED   Tetrahydrocannabinol NONE DETECTED NONE DETECTED   Barbiturates NONE DETECTED NONE DETECTED  Ethanol  Result Value Ref Range   Alcohol, Ethyl (B) <5 0 - 9 mg/dL    1735:  TTS has evaluated pt: will try to find detox placement but SI may make this difficult, they will re-eval pt tomorrow morning. Holding orders written.   1950:  Pt and her family's behavior has escalated: states "someone told them to have pt say that she was suicidal," pt states she has "restless legs" and "needs the narcotics to make them stop." When told she was not going to received narcotics, pt then told ED staff that she was going to leave. Given pt's previous statement of SI; IVC paperwork was completed. TSS updated.   2120:  Pt has been accepted to [facility], Dr. Sabra Heck, room 306-2. Will transfer stable.   Francine Graven, DO 06/18/14 1038

## 2014-06-16 ENCOUNTER — Encounter (HOSPITAL_COMMUNITY): Payer: Self-pay | Admitting: *Deleted

## 2014-06-16 ENCOUNTER — Inpatient Hospital Stay (HOSPITAL_COMMUNITY)
Admission: AD | Admit: 2014-06-16 | Discharge: 2014-06-18 | DRG: 881 | Disposition: A | Discharge status: Home / Self Care | Payer: Medicare Other | Source: Intra-hospital | Attending: Psychiatry | Admitting: Psychiatry

## 2014-06-16 DIAGNOSIS — J449 Chronic obstructive pulmonary disease, unspecified: Secondary | ICD-10-CM | POA: Diagnosis present

## 2014-06-16 DIAGNOSIS — R45851 Suicidal ideations: Secondary | ICD-10-CM

## 2014-06-16 DIAGNOSIS — F1120 Opioid dependence, uncomplicated: Secondary | ICD-10-CM | POA: Diagnosis present

## 2014-06-16 DIAGNOSIS — F17210 Nicotine dependence, cigarettes, uncomplicated: Secondary | ICD-10-CM | POA: Diagnosis present

## 2014-06-16 DIAGNOSIS — F1910 Other psychoactive substance abuse, uncomplicated: Secondary | ICD-10-CM | POA: Diagnosis not present

## 2014-06-16 DIAGNOSIS — G4700 Insomnia, unspecified: Secondary | ICD-10-CM | POA: Diagnosis present

## 2014-06-16 DIAGNOSIS — F410 Panic disorder [episodic paroxysmal anxiety] without agoraphobia: Secondary | ICD-10-CM | POA: Diagnosis present

## 2014-06-16 DIAGNOSIS — Z825 Family history of asthma and other chronic lower respiratory diseases: Secondary | ICD-10-CM

## 2014-06-16 DIAGNOSIS — F329 Major depressive disorder, single episode, unspecified: Secondary | ICD-10-CM | POA: Diagnosis present

## 2014-06-16 DIAGNOSIS — Z86711 Personal history of pulmonary embolism: Secondary | ICD-10-CM | POA: Diagnosis not present

## 2014-06-16 DIAGNOSIS — F1124 Opioid dependence with opioid-induced mood disorder: Secondary | ICD-10-CM | POA: Diagnosis not present

## 2014-06-16 DIAGNOSIS — F32A Depression, unspecified: Secondary | ICD-10-CM | POA: Diagnosis present

## 2014-06-16 MED ORDER — HYDROXYZINE HCL 25 MG PO TABS: 25.0000 mg | ORAL_TABLET | Freq: Four times a day (QID) | ORAL | Status: DC | PRN

## 2014-06-16 MED ORDER — CLONIDINE HCL 0.1 MG PO TABS
0.1000 mg | ORAL_TABLET | Freq: Four times a day (QID) | ORAL | Status: DC
  Administered 2014-06-16 – 2014-06-17 (×3): 0.1 mg via ORAL
  Filled 2014-06-16 (×11): qty 1

## 2014-06-16 MED ORDER — HYDROXYZINE HCL 25 MG PO TABS
25.0000 mg | ORAL_TABLET | Freq: Four times a day (QID) | ORAL | Status: DC | PRN
  Administered 2014-06-16 – 2014-06-18 (×4): 25 mg via ORAL
  Filled 2014-06-16 (×2): qty 1
  Filled 2014-06-16: qty 6
  Filled 2014-06-16 (×2): qty 1

## 2014-06-16 MED ORDER — CLONIDINE HCL 0.1 MG PO TABS
0.1000 mg | ORAL_TABLET | ORAL | Status: DC
  Filled 2014-06-16 (×4): qty 1

## 2014-06-16 MED ORDER — NICOTINE 21 MG/24HR TD PT24
21.0000 mg | MEDICATED_PATCH | Freq: Every day | TRANSDERMAL | Status: DC
  Administered 2014-06-16 – 2014-06-17 (×2): 21 mg via TRANSDERMAL
  Filled 2014-06-16 (×6): qty 1

## 2014-06-16 MED ORDER — ACETAMINOPHEN 325 MG PO TABS: 650.0000 mg | ORAL_TABLET | Freq: Four times a day (QID) | ORAL | Status: DC | PRN

## 2014-06-16 MED ORDER — ALUM & MAG HYDROXIDE-SIMETH 200-200-20 MG/5ML PO SUSP: 30.0000 mL | ORAL | Status: DC | PRN

## 2014-06-16 MED ORDER — LORAZEPAM 0.5 MG PO TABS
0.5000 mg | ORAL_TABLET | Freq: Once | ORAL | Status: DC
  Filled 2014-06-16: qty 1

## 2014-06-16 MED ORDER — DICYCLOMINE HCL 20 MG PO TABS
20.0000 mg | ORAL_TABLET | Freq: Four times a day (QID) | ORAL | Status: DC | PRN
  Administered 2014-06-16 – 2014-06-17 (×4): 20 mg via ORAL
  Filled 2014-06-16 (×5): qty 1

## 2014-06-16 MED ORDER — MAGNESIUM HYDROXIDE 400 MG/5ML PO SUSP: 30.0000 mL | Freq: Every day | ORAL | Status: DC | PRN

## 2014-06-16 MED ORDER — CLONIDINE HCL 0.1 MG PO TABS: 0.1000 mg | ORAL_TABLET | Freq: Every day | ORAL | Status: DC

## 2014-06-16 MED ORDER — ONDANSETRON 4 MG PO TBDP
4.0000 mg | ORAL_TABLET | Freq: Four times a day (QID) | ORAL | Status: DC | PRN
  Administered 2014-06-16 – 2014-06-17 (×2): 4 mg via ORAL
  Filled 2014-06-16 (×2): qty 1

## 2014-06-16 MED ORDER — METHOCARBAMOL 500 MG PO TABS
500.0000 mg | ORAL_TABLET | Freq: Three times a day (TID) | ORAL | Status: DC | PRN
  Administered 2014-06-16 – 2014-06-18 (×4): 500 mg via ORAL
  Filled 2014-06-16 (×4): qty 1

## 2014-06-16 MED ORDER — LOPERAMIDE HCL 2 MG PO CAPS
2.0000 mg | ORAL_CAPSULE | ORAL | Status: DC | PRN
  Administered 2014-06-16: 2 mg via ORAL
  Filled 2014-06-16: qty 1

## 2014-06-16 MED ORDER — NAPROXEN 500 MG PO TABS
500.0000 mg | ORAL_TABLET | Freq: Two times a day (BID) | ORAL | Status: DC | PRN
  Administered 2014-06-16 – 2014-06-18 (×4): 500 mg via ORAL
  Filled 2014-06-16 (×5): qty 1
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
## 2014-06-16 NOTE — [facility] Group Notes (Signed)
The focus of this group is to educate the patient on the purpose and policies of crisis stabilization and provide a format to answer questions about their admission.  The group details unit policies and expectations of patients while admitted.  Patient did not attend 0900 nurse education orientation group this morning.  Patient stayed in bed sleeping.    

## 2014-06-16 NOTE — [facility] Group Notes (Signed)
[facility] Group Notes: (Clinical Social Work)   06/16/2014      Type of Therapy:  Group Therapy   Participation Level:  Did Not Attend despite MHT prompting   Selmer Dominion, LCSW 06/16/2014, 12:19 PM

## 2014-06-16 NOTE — Progress Notes (Signed)
Patient did not attend the evening speaker AA meeting. Pt was notified that group was beginning but remained in bed.   

## 2014-06-16 NOTE — Progress Notes (Signed)
Patient ID: [name], female   DOB: 05-16-1970, 44 y.o.   MRN: [id number] D:Affect is blunted/flat ,mood is depressed. Pt rates depression as 2 of 10 and anxiety as 8 of 10 this AM and marks that she is having pain and withdrawal symptoms. A:Rounded on pt to assess needs and offer support. R:Pt is sleeping comfortably at this time.

## 2014-06-16 NOTE — Progress Notes (Signed)
D)  Came to the med window this evening, talking about all the drugs she has been using, stated was having some cravings and wanted meds for withdrawal, stated had been using heroin and just about anything else she could get, now c/o nausea, muscle spasms , etc.  Was medicated as per sx.  Stated she used to be afraid of needles, now she couldn't wait for them.. Verbalized her anger for being here, stated had tried to get into the [facility] program, felt she had been lied to, felt she should be allowed to leave, irritable.  Stated she had started to go to [facility] group this evening, but didn't feel well enough to sit through it, and didn't really want to hear it, "has been to them before",so stayed in her room, in bed. A)  Will continue to monitor for safety, encourage to attend groups and participate, support, continue POC R)  Safety maintained.

## 2014-06-16 NOTE — Tx Team (Signed)
Initial Interdisciplinary Treatment Plan   PATIENT STRESSORS: Financial difficulties Substance abuse   PATIENT STRENGTHS: Average or above average intelligence Supportive family/friends   PROBLEM LIST: Problem List/Patient Goals Date to be addressed Date deferred Reason deferred Estimated date of resolution  Substance abuse 06/16/14     Suicidal ideation 06/16/14     (Pt is resistant, wishes to go smoke.  Couldn't think of what to work on at the moment)                                           DISCHARGE CRITERIA:  Motivation to continue treatment in a less acute level of care Need for constant or close observation no longer present Withdrawal symptoms are absent or subacute and managed without 24-hour nursing intervention  PRELIMINARY DISCHARGE PLAN: Attend 12-step recovery group  PATIENT/FAMIILY INVOLVEMENT: This treatment plan has been presented to and reviewed with the patient, [name].  The patient and family have been given the opportunity to ask questions and make suggestions.  Margaretann Loveless 06/16/2014, 1:35 AM
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
## 2014-06-16 NOTE — BHH Suicide Risk Assessment (Signed)
[facility] Admission Suicide Risk Assessment   Nursing information obtained from:  Patient Demographic factors:  Caucasian, Low socioeconomic status, Unemployed Current Mental Status:  Suicidal ideation indicated by others Loss Factors:  Financial problems / change in socioeconomic status Historical Factors:  Family history of mental illness or substance abuse Risk Reduction Factors:  Sense of responsibility to family, Living with another person, especially a relative Total Time spent with patient: 30 minutes Principal Problem: Polysubstance abuse Diagnosis:   Patient Active Problem List   Diagnosis Date Noted  . Polysubstance abuse [F19.10] 06/16/2014    Class: Chronic  . Suicide ideation [R45.851] 06/16/2014    Class: Acute  . PFS (patellofemoral syndrome) [M22.2X9] 06/06/2012  . Obesity, unspecified [E66.9] 06/06/2012     Continued Clinical Symptoms:  Alcohol Use Disorder Identification Test Final Score (AUDIT): 0 The "Alcohol Use Disorders Identification Test", Guidelines for Use in Primary Care, Second Edition.  World Pharmacologist [facility]). Score between 0-7:  no or low risk or alcohol related problems. Score between 8-15:  moderate risk of alcohol related problems. Score between 16-19:  high risk of alcohol related problems. Score 20 or above:  warrants further diagnostic evaluation for alcohol dependence and treatment.   CLINICAL FACTORS:   Alcohol/Substance Abuse/Dependencies   Musculoskeletal: Strength & Muscle Tone: within normal limits Gait & Station: normal Patient leans: N/A  Psychiatric Specialty Exam: Physical Exam  ROS  Blood pressure 101/64, pulse 87, temperature 97.8 F (36.6 C), temperature source Oral, resp. rate 16, height 5\' 1"  (1.549 m), weight 85.73 kg (189 lb), last menstrual period 06/01/2014.Body mass index is 35.73 kg/(m^2).  General Appearance: Disheveled and Guarded  Eye Contact::  Poor  Speech:  Normal Rate  Volume:  Increased  Mood:   Anxious and Irritable  Affect:  Congruent and Tearful  Thought Process:  Coherent and Goal Directed  Orientation:  Full (Time, Place, and Person)  Thought Content:  Negative  Suicidal Thoughts:  No  Homicidal Thoughts:  No  Memory:  Negative  Judgement:  Impaired  Insight:  Shallow  Psychomotor Activity:  Increased and Restlessness  Concentration:  Fair  Recall:  [employer] of Knowledge:Fair  Language: Fair  Akathisia:  Negative  Handed:  Right  AIMS (if indicated):     Assets:  Communication Skills Desire for Improvement  Sleep:  Number of Hours: 4.25  Cognition: WNL  ADL's:  Intact     COGNITIVE FEATURES THAT CONTRIBUTE TO RISK:  Loss of executive function and Thought constriction (tunnel vision)    SUICIDE RISK:   Mild:  Suicidal ideation of limited frequency, intensity, duration, and specificity.  There are no identifiable plans, no associated intent, mild dysphoria and related symptoms, good self-control (both objective and subjective assessment), few other risk factors, and identifiable protective factors, including available and accessible social support.  PLAN OF CARE: Will admit for safety/stabilization. Pt now stating that she only said she was suicidal last night, because she thought that would make it easier for her to get into rehab. Pt reports that she did not really want to kill herself last night, but felt that she was slowly killing herself with IVDA (heroin, opana, occasional cocaine). Pt starting to exhibit opiate withdrawal symptoms, so will start opioid detox protocol for comfort meds. SW to talk to pt tomorrow about referrals to rehab programs.  Medical Decision Making:  New problem, with additional work up planned, Review of Psycho-Social Stressors (1) and Review of Medication Regimen & Side Effects (2)  I  certify that inpatient services furnished can reasonably be expected to improve the patient's condition.   Dereck Leep 06/16/2014, 3:06 PM

## 2014-06-16 NOTE — BHH Group Notes (Signed)
Adult Psychoeducational Group Note  Date:  06/16/2014 Time:  1330  Group Topic/Focus:  Making Healthy Choices:   The focus of this group is to help patients identify negative/unhealthy choices they were using prior to admission and identify positive/healthier coping strategies to replace them upon discharge.  Participation Level:  Did Not Attend  Participation Quality:    Affect:    Cognitive:    Insight:   Engagement in Group:    Modes of Intervention:    Additional Comments:    [name] 06/16/2014, 5:29 PM

## 2014-06-17 DIAGNOSIS — F1124 Opioid dependence with opioid-induced mood disorder: Secondary | ICD-10-CM

## 2014-06-17 DIAGNOSIS — F1120 Opioid dependence, uncomplicated: Secondary | ICD-10-CM | POA: Diagnosis present

## 2014-06-17 DIAGNOSIS — F32A Depression, unspecified: Secondary | ICD-10-CM | POA: Diagnosis present

## 2014-06-17 DIAGNOSIS — F329 Major depressive disorder, single episode, unspecified: Secondary | ICD-10-CM | POA: Diagnosis present

## 2014-06-17 LAB — URINALYSIS, ROUTINE W REFLEX MICROSCOPIC
Bilirubin Urine: NEGATIVE
Glucose, UA: NEGATIVE mg/dL
Ketones, ur: NEGATIVE mg/dL
Nitrite: NEGATIVE
Protein, ur: NEGATIVE mg/dL
Specific Gravity, Urine: 1.012 (ref 1.005–1.030)
Urobilinogen, UA: 0.2 mg/dL (ref 0.0–1.0)
pH: 6 (ref 5.0–8.0)

## 2014-06-17 LAB — URINE MICROSCOPIC-ADD ON

## 2014-06-17 MED ORDER — TRAZODONE HCL 50 MG PO TABS
50.0000 mg | ORAL_TABLET | Freq: Every evening | ORAL | Status: DC | PRN
  Administered 2014-06-17 (×2): 50 mg via ORAL
  Filled 2014-06-17 (×6): qty 1
  Filled 2014-06-17: qty 6
  Filled 2014-06-17 (×2): qty 1

## 2014-06-17 NOTE — Clinical Social Work Note (Signed)
CSW spoke with pt regarding aftercare plan/treatment options. Pt refusing referral from [facility] to any other inpatient treatment facility and states that she plans to do this "on my own" after she returns home "and gets things in order." Pt given information to [facility] and [facility] regarding admission for treatment and was encouraged to follow-up with them at d/c. Pt also given comprehensive resource list for [facility] and [employer] for [facility]. Pt has appt scheduled for assessment at [facility] for Friday. CSW spoke with pt's cousin who is her primary support and the person that she will be living with temporarily at d/c. He verbalized understanding of the above information and did not express concerns about pt d/cing on Tuesday 06/18/14 regarding pt safety. Pt's cousin also verbalized understanding of all SPE information.   [place], [place] 06/17/2014 3:37 PM

## 2014-06-17 NOTE — Progress Notes (Signed)
Pt alert and cooperative. Affect/ mood anxious, worried,  depressed and tearful. Pt on phone a lot this evening with boyfriend. "He called me a needle junky", "My boyfriend of 14 years says we have problems, I feel like I am choking on heart". -SI/HI, -A/V hall, verbally contracts for safety.  C/o chronic back pain and restless legs, see MAR. Attending group and interacts with peers. "I plan to go stay with my cousin Abe People, he will do whatever to stop me from using". "I'm not going home for awhile, my house was the party house". Emotional support and encouragement given. Will continue to monitor closely and evaluate for stabilization.

## 2014-06-17 NOTE — BHH Group Notes (Signed)
[facility] LCSW Group Therapy  06/17/2014 3:11 PM  Type of Therapy:  Group Therapy  Participation Level:  Active  Participation Quality:  Appropriate and Drowsy  Affect:  Flat  Cognitive:  Oriented  Insight:  Improving  Engagement in Therapy:  Improving  Modes of Intervention:  Confrontation, Discussion, Education, Exploration, Problem-solving, Rapport Building, Socialization and Support  Summary of Progress/Problems: Today's Topic: Overcoming Obstacles. Pt identified obstacles faced currently and processed barriers involved in overcoming these obstacles. Pt identified steps necessary for overcoming these obstacles and explored motivation (internal and external) for facing these difficulties head on. Pt further identified one area of concern in their lives and chose a skill of focus pulled from their "toolbox." [name] was attentive during group but continues to report drowsiness. She shared that her biggest obstacle involves "fighting withdrawals and cravings." [name] stated that she is not interested in direct transfer to i/p facility for further tx and plans to stay with a cousin temporarily until she can "get some things handled." [name] continues to demonstrate some insight and progress in the group setting. "I've got a lot of numbers to block in my phone and need to change locks at my house. My son is supportive and I need to make sure I ask for help."    [name], [name]  06/17/2014, 3:11 PM

## 2014-06-17 NOTE — Progress Notes (Signed)
Patient ID: [name], female   DOB: Sep 28, 1970, 44 y.o.   MRN: [id number] PER STATE REGULATIONS 482.30  THIS CHART WAS REVIEWED FOR MEDICAL NECESSITY WITH RESPECT TO THE PATIENT'S ADMISSION/DURATION OF STAY.  NEXT REVIEW DATE: 06/20/14  Roma Schanz, RN, BSN CASE MANAGER

## 2014-06-17 NOTE — Tx Team (Signed)
Interdisciplinary Treatment Plan Update (Adult)   Date: 06/17/2014   Time Reviewed: 8:51 AM  Progress in Treatment:  Attending groups: Yes  Participating in groups:  Yes  Taking medication as prescribed: Yes  Tolerating medication: Yes  Family/Significant othe contact made: Not yet. SPE required for this pt. [facility])   Patient understands diagnosis: Yes, AEB seeking treatment for passive SI, opiate abuse/cocaine abuse, medication stabilization, and depression/mood instability.  Discussing patient identified problems/goals with staff: Yes  Medical problems stabilized or resolved: Yes  Denies suicidal/homicidal ideation: Yes during group/self report.  Patient has not harmed self or Others: Yes  New problem(s) identified:  Discharge Plan or Barriers: Pt reports that she wants to d/c home asap and is not open to further i/p treatment referrals. Pt agreeable to follow-up at [facility] has been scheduled.  Additional comments:  Patient states that she went to [facility] last night requesting help to get into a rehab facility; Patient states that she uses mostly Opana/heroin "shooting up" if I can't get anything else I use cocaine. State cocaine shot up two days ago and also shot heroin up 2 days ago. Patient held out her arms and has multiple areas where she shoots up drugs. Patient states that she is not suicidal. Patient states that a family member told her that she would only get help if she told the people she was suicidal. Patient denies homicidal ideation, psychosis, and paranoia. Patient has history of inpatient rehab "many years ago." States that she drove from [place] went to [place] but private facility and drove "down here and then told I can't leave cause I'm suicidal."  Patient denies history of suicide attempt; denies psych inpatient treatment; but states that she has been on "Prozac and xanax for depression and anxiety when saw a psychiatrist couple years back Reason  for Continuation of Hospitalization: Clonidine taper-withdrawals Mood instability/depression/agitation Estimated length of stay: 1-2 days  For review of initial/current patient goals, please see plan of care.  Attendees:  Patient:    Family:    Physician: Carlton Adam MD 06/17/2014   Nursing: Rise Paganini RN; Alexandria T RN 06/17/2014   Clinical Social Worker [place], [place]  06/17/2014   Other: [place] Scrape H. LCSW 06/17/2014   Other: Gerline Legacy Nurse CM 06/17/2014   Other: Hilda Lias, Community Care Coordinator  06/17/2014   Other: Mateo Flow; [facility] TCT  06/17/2014   Scribe for Treatment Team:  Nira Conn Smart LCSWA 06/17/2014 9:30AM

## 2014-06-17 NOTE — BHH Suicide Risk Assessment (Signed)
[facility] INPATIENT:  Family/Significant Other Suicide Prevention Education  Suicide Prevention Education:  Education Completed; Rosey Bath (pt's cousin) [phone] has been identified by the patient as the family member/significant other with whom the patient will be residing, and identified as the person(s) who will aid the patient in the event of a mental health crisis (suicidal ideations/suicide attempt).  With written consent from the patient, the family member/significant other has been provided the following suicide prevention education, prior to the and/or following the discharge of the patient.  The suicide prevention education provided includes the following:  Suicide risk factors  Suicide prevention and interventions  National Suicide Hotline telephone number  [facility] assessment telephone number  [facility] Emergency Assistance [place] and/or Residential Mobile Crisis Unit telephone number  Request made of family/significant other to:  Remove weapons (e.g., guns, rifles, knives), all items previously/currently identified as safety concern.    Remove drugs/medications (over-the-counter, prescriptions, illicit drugs), all items previously/currently identified as a safety concern.  The family member/significant other verbalizes understanding of the suicide prevention education information provided.  The family member/significant other agrees to remove the items of safety concern listed above.  Smart, Shaughnessy Gethers LCSWA  06/17/2014, 3:26 PM

## 2014-06-17 NOTE — Progress Notes (Signed)
[facility] MD Progress Note  06/17/2014 11:13 AM [name]  MRN:  [id number] Subjective:  States that she was shooting heroin. She states she said she was suicidal to be able to get some help. She went to [place] as she had been there before but she did not realized she was being sponsored then. She does not have the resources to pay for that program. She states she cant continue to do what she keeps doing. She has a family, two kids grandkids and she is running her life. Principal Problem: Polysubstance abuse Diagnosis:   Patient Active Problem List   Diagnosis Date Noted  . Polysubstance abuse [F19.10] 06/16/2014    Class: Chronic  . Suicide ideation [R45.851] 06/16/2014    Class: Acute  . PFS (patellofemoral syndrome) [M22.2X9] 06/06/2012  . Obesity, unspecified [E66.9] 06/06/2012   Total Time spent with patient: 30 minutes   Past Medical History:  Past Medical History  Diagnosis Date  . COPD (chronic obstructive pulmonary disease)   . Anxiety   . Panic attacks   . Pulmonary embolism   . Chronic back pain   . DDD (degenerative disc disease)   . Chronic knee pain   . Polysubstance abuse     cocaine, opiates, marijuana, amphetamines, "molly," crystal meth    Past Surgical History  Procedure Laterality Date  . Cesarean section    . Nose surgery    . Tubes tied     Family History:  Family History  Problem Relation Age of Onset  . Arthritis    . Lung disease    . Cancer    . Asthma     Social History:  History  Alcohol Use No     History  Drug Use  . Yes  . Special: IV, Cocaine, Marijuana, Methamphetamines    Comment: Crystal meth, "molly," narcotics    History   Social History  . Marital Status: Divorced    Spouse Name: N/A  . Number of Children: N/A  . Years of Education: 9th grade   Occupational History  .     Social History Main Topics  . Smoking status: Current Every Day Smoker -- 0.50 packs/day for 25 years    Types: Cigarettes  .  Smokeless tobacco: Never Used  . Alcohol Use: No  . Drug Use: Yes    Special: IV, Cocaine, Marijuana, Methamphetamines     Comment: Crystal meth, "molly," narcotics  . Sexual Activity: Yes    Birth Control/ Protection: Surgical   Other Topics Concern  . None   Social History Narrative   Additional History:    Sleep: Poor  Appetite:  Poor   Assessment:   Musculoskeletal: Strength & Muscle Tone: within normal limits Gait & Station: normal Patient leans: N/A   Psychiatric Specialty Exam: Physical Exam  Review of Systems  Constitutional: Positive for weight loss and malaise/fatigue.  HENT: Negative.   Eyes: Positive for blurred vision.  Respiratory: Positive for shortness of breath.        One pack a day  Cardiovascular: Negative.   Gastrointestinal: Positive for nausea, vomiting and diarrhea.  Genitourinary: Negative.   Musculoskeletal: Positive for myalgias and back pain.  Skin: Negative.   Neurological: Positive for dizziness, tremors and weakness.  Endo/Heme/Allergies: Negative.   Psychiatric/Behavioral: Positive for substance abuse. The patient is nervous/anxious and has insomnia.     Blood pressure 107/70, pulse 77, temperature 97.9 F (36.6 C), temperature source Oral, resp. rate 16, height 5'  1" (1.549 m), weight 85.73 kg (189 lb), last menstrual period 06/01/2014.Body mass index is 35.73 kg/(m^2).  General Appearance: Fairly Groomed  Engineer, water::  Fair  Speech:  Clear and Coherent  Volume:  fluctuates  Mood:  Anxious, Depressed and Dysphoric  Affect:  anxious worried  Thought Process:  Coherent and Goal Directed  Orientation:  Full (Time, Place, and Person)  Thought Content:  symptoms events worries concerns  Suicidal Thoughts:  No  Homicidal Thoughts:  No  Memory:  Immediate;   Fair Recent;   Fair Remote;   Fair  Judgement:  Fair  Insight:  Present and Shallow  Psychomotor Activity:  Restlessness  Concentration:  Fair  Recall:  [employer] of  Knowledge:Fair  Language: Fair  Akathisia:  No  Handed:  Right  AIMS (if indicated):     Assets:  Desire for Improvement Housing Social Support  ADL's:  Intact  Cognition: WNL  Sleep:  Number of Hours: 5.25     Current Medications: Current Facility-Administered Medications  Medication Dose Route Frequency Provider Last Rate Last Dose  . acetaminophen (TYLENOL) tablet 650 mg  650 mg Oral Q6H PRN Harriet Butte, NP      . alum & mag hydroxide-simeth (MAALOX/MYLANTA) 200-200-20 MG/5ML suspension 30 mL  30 mL Oral Q4H PRN Harriet Butte, NP      . cloNIDine (CATAPRES) tablet 0.1 mg  0.1 mg Oral QID Shuvon B Rankin, NP   0.1 mg at 06/17/14 0804   Followed by  . [START ON 06/18/2014] cloNIDine (CATAPRES) tablet 0.1 mg  0.1 mg Oral BH-qamhs Shuvon B Rankin, NP       Followed by  . [START ON 06/21/2014] cloNIDine (CATAPRES) tablet 0.1 mg  0.1 mg Oral QAC breakfast Shuvon B Rankin, NP      . dicyclomine (BENTYL) tablet 20 mg  20 mg Oral Q6H PRN Shuvon B Rankin, NP   20 mg at 06/17/14 0804  . hydrOXYzine (ATARAX/VISTARIL) tablet 25 mg  25 mg Oral Q6H PRN Shuvon B Rankin, NP   25 mg at 06/17/14 0804  . loperamide (IMODIUM) capsule 2-4 mg  2-4 mg Oral PRN Shuvon B Rankin, NP   2 mg at 06/16/14 2124  . magnesium hydroxide (MILK OF MAGNESIA) suspension 30 mL  30 mL Oral Daily PRN Harriet Butte, NP      . methocarbamol (ROBAXIN) tablet 500 mg  500 mg Oral Q8H PRN Shuvon B Rankin, NP   500 mg at 06/17/14 0804  . naproxen (NAPROSYN) tablet 500 mg  500 mg Oral BID PRN Shuvon B Rankin, NP   500 mg at 06/17/14 0804  . nicotine (NICODERM CQ - dosed in mg/24 hours) patch 21 mg  21 mg Transdermal Daily Shuvon B Rankin, NP   21 mg at 06/17/14 0808  . ondansetron (ZOFRAN-ODT) disintegrating tablet 4 mg  4 mg Oral Q6H PRN Shuvon B Rankin, NP   4 mg at 06/17/14 0804    Lab Results:  Results for orders placed or performed during the hospital encounter of 06/15/14 (from the past 48 hour(s))  Drug screen  panel, emergency     Status: Abnormal   Collection Time: 06/15/14  4:20 PM  Result Value Ref Range   Opiates NONE DETECTED NONE DETECTED   Cocaine POSITIVE (A) NONE DETECTED   Benzodiazepines NONE DETECTED NONE DETECTED   Amphetamines NONE DETECTED NONE DETECTED   Tetrahydrocannabinol NONE DETECTED NONE DETECTED   Barbiturates NONE DETECTED NONE DETECTED  Comment:        DRUG SCREEN FOR MEDICAL PURPOSES ONLY.  IF CONFIRMATION IS NEEDED FOR ANY PURPOSE, NOTIFY LAB WITHIN 5 DAYS.        LOWEST DETECTABLE LIMITS FOR URINE DRUG SCREEN Drug Class       Cutoff (ng/mL) Amphetamine      1000 Barbiturate      200 Benzodiazepine   088 Tricyclics       110 Opiates          300 Cocaine          300 THC              50   CBC WITH DIFFERENTIAL     Status: Abnormal   Collection Time: 06/15/14  5:13 PM  Result Value Ref Range   WBC 8.5 4.0 - 10.5 K/uL   RBC 4.41 3.87 - 5.11 MIL/uL   Hemoglobin 13.2 12.0 - 15.0 g/dL   HCT 38.8 36.0 - 46.0 %   MCV 88.0 78.0 - 100.0 fL   MCH 29.9 26.0 - 34.0 pg   MCHC 34.0 30.0 - 36.0 g/dL   RDW 14.7 11.5 - 15.5 %   Platelets 317 150 - 400 K/uL   Neutrophils Relative % 38 (L) 43 - 77 %   Lymphocytes Relative 53 (H) 12 - 46 %   Monocytes Relative 6 3 - 12 %   Eosinophils Relative 3 0 - 5 %   Basophils Relative 0 0 - 1 %   Band Neutrophils 0 0 - 10 %   Metamyelocytes Relative 0 %   Myelocytes 0 %   Promyelocytes Absolute 0 %   Blasts 0 %   nRBC 0 0 /100 WBC   Neutro Abs 3.2 1.7 - 7.7 K/uL   Lymphs Abs 4.5 (H) 0.7 - 4.0 K/uL   Monocytes Absolute 0.5 0.1 - 1.0 K/uL   Eosinophils Absolute 0.3 0.0 - 0.7 K/uL   Basophils Absolute 0.0 0.0 - 0.1 K/uL  Comprehensive metabolic panel     Status: Abnormal   Collection Time: 06/15/14  5:13 PM  Result Value Ref Range   Sodium 138 135 - 145 mmol/L   Potassium 3.3 (L) 3.5 - 5.1 mmol/L   Chloride 108 96 - 112 mmol/L   CO2 25 19 - 32 mmol/L   Glucose, Bld 104 (H) 70 - 99 mg/dL   BUN 10 6 - 23 mg/dL    Creatinine, Ser 0.81 0.50 - 1.10 mg/dL   Calcium 8.6 8.4 - 10.5 mg/dL   Total Protein 7.9 6.0 - 8.3 g/dL   Albumin 3.5 3.5 - 5.2 g/dL   AST 62 (H) 0 - 37 U/L   ALT 92 (H) 0 - 35 U/L   Alkaline Phosphatase 52 39 - 117 U/L   Total Bilirubin 0.2 (L) 0.3 - 1.2 mg/dL   GFR calc non Af Amer 88 (L) >90 mL/min   GFR calc Af Amer >90 >90 mL/min    Comment: (NOTE) The eGFR has been calculated using the CKD EPI equation. This calculation has not been validated in all clinical situations. eGFR's persistently <90 mL/min signify possible Chronic Kidney Disease.    Anion gap 5 5 - 15  Ethanol     Status: None   Collection Time: 06/15/14  5:13 PM  Result Value Ref Range   Alcohol, Ethyl (B) <5 0 - 9 mg/dL    Comment:        LOWEST DETECTABLE LIMIT FOR SERUM ALCOHOL IS  11 mg/dL FOR MEDICAL PURPOSES ONLY     Physical Findings: AIMS: Facial and Oral Movements Muscles of Facial Expression: None, normal Lips and Perioral Area: None, normal Jaw: None, normal Tongue: None, normal,Extremity Movements Upper (arms, wrists, hands, fingers): None, normal Lower (legs, knees, ankles, toes): None, normal, Trunk Movements Neck, shoulders, hips: None, normal, Overall Severity Severity of abnormal movements (highest score from questions above): None, normal Incapacitation due to abnormal movements: None, normal Patient's awareness of abnormal movements (rate only patient's report): No Awareness, Dental Status Current problems with teeth and/or dentures?: No Does patient usually wear dentures?: No  CIWA:  CIWA-Ar Total: 9 COWS:  COWS Total Score: 6  Treatment Plan Summary: Daily contact with patient to assess and evaluate symptoms and progress in treatment and Medication management Supportive approach/coping skills Opioid Dependence: continue clonidine detox protocol Mood Disorder: reassess to see if substance induced or co morbidity Work a relapse prevention plan Increased liver enzymes: get an  acute hepatitis profile Explore other residential treatment options  Medical Decision Making:  Review of Psycho-Social Stressors (1), Review or order clinical lab tests (1), Review of Medication Regimen & Side Effects (2) and Review of New Medication or Change in Dosage (2)     Shawndell Varas A 06/17/2014, 11:13 AM

## 2014-06-17 NOTE — BHH Group Notes (Signed)
[facility] LCSW Aftercare Discharge Planning Group Note   06/17/2014 9:56 AM  Participation Quality:  Appropriate/monopolizing at times but redirectable.   Mood/Affect:  Irritable  Depression Rating:  0  Anxiety Rating:  5-"I was traumatized by this experience. I just want to go home."   Thoughts of Suicide:  No Will you contract for safety?   NA  Current AVH:  No  Plan for Discharge/Comments:  Pt reports that she wants to d/c due to "bad experience getting committed." pt states that two triage nurses told her to say that she was suicidal to be admitted." Pt reports she uses heroin, meth, cocaine, and opana recently." Pt refusing further i/p treatment at this time. "I just want to go home." [facility]. Moderate withdrawals today. Pt lives at home with two adult kids and one grandchild. "My house is the party house."   Transportation Means: ? Family member possibly.   Supports: family and some friends.   Smart, [employer]

## 2014-06-17 NOTE — Progress Notes (Signed)
Adult Psychoeducational Group Note  Date:  06/17/2014 Time:  9:59 PM  Group Topic/Focus:  Wrap-Up Group:   The focus of this group is to help patients review their daily goal of treatment and discuss progress on daily workbooks.  Participation Level:  Active  Participation Quality:  Appropriate  Affect:  Appropriate  Cognitive:  Appropriate  Insight: Appropriate  Engagement in Group:  Engaged  Modes of Intervention:  Discussion  Additional Comments:  Pt states that today was a good day until speaking to her significant other. Pt states that boyfriend is a major stressor in her life.  Wyonia Hough 06/17/2014, 9:59 PM

## 2014-06-17 NOTE — Progress Notes (Signed)
D: Patient denies SI/HI and A/V hallucinations; patient reports sleep is good; reports appetite is good; reports energy level is normal ; reports ability to concentrate is good; rates depression as 4/10; rates hopelessness 1/10; rates anxiety as 4/10; patient having complaints about nausea, stomach cramps, anxiety, and generalized pain and discomfort; patient also reported that she wanted to go;  A: Monitored q 15 minutes; patient encouraged to attend groups; patient educated about medications; patient given medications per physician orders; patient encouraged to express feelings and/or concerns  R: Patient is cooperative and pleasant; patient does appear to be anxious; patient has been eating snacks and eating at meals; patient's interaction with staff and peers is appropriare; patient engages appropriately on the unit; patient was able to set goal to talk with staff 1:1 when having feelings of SI; patient is taking medications as prescribed and tolerating medications; patient is attending all groups

## 2014-06-17 NOTE — Progress Notes (Signed)
Recreation Therapy Notes  Date: 03.07.2016 Time: 9:30am Location: [address] Group Room   Group Topic: Stress Management  Goal Area(s) Addresses:  Patient will actively participate in stress management techniques presented during session.   Behavioral Response: Active, Engaged, Appropriate   Intervention: Stress Management Techniques  Activity : Progressive Muscle Relaxation & Guided Imagery - Under LRT instruction patients participated in progressive muscle relaxation and guided imagery.   Education:  Stress Management, Discharge Planning.   Education Outcome: Acknowledges education  Clinical Observations/Feedback: Patient attended alternate stress management group session offered to patients on adult unit. Patient actively engaged in both stress management techniques introduced. Patient voiced no concerns during session.   Laureen Ochs Naiyah Klostermann, LRT/CTRS  Nataly Pacifico L 06/17/2014 3:17 PM

## 2014-06-18 DIAGNOSIS — F1120 Opioid dependence, uncomplicated: Secondary | ICD-10-CM

## 2014-06-18 DIAGNOSIS — F1124 Opioid dependence with opioid-induced mood disorder: Secondary | ICD-10-CM | POA: Insufficient documentation

## 2014-06-18 LAB — BASIC METABOLIC PANEL
Anion gap: 6 (ref 5–15)
BUN: 12 mg/dL (ref 6–23)
CO2: 24 mmol/L (ref 19–32)
Calcium: 8.6 mg/dL (ref 8.4–10.5)
Chloride: 108 mmol/L (ref 96–112)
Creatinine, Ser: 0.92 mg/dL (ref 0.50–1.10)
GFR calc Af Amer: 88 mL/min — ABNORMAL LOW (ref >90)
GFR calc non Af Amer: 76 mL/min — ABNORMAL LOW (ref >90)
Glucose, Bld: 116 mg/dL — ABNORMAL HIGH (ref 70–99)
Potassium: 4.4 mmol/L (ref 3.5–5.1)
Sodium: 138 mmol/L (ref 135–145)

## 2014-06-18 LAB — HCV RNA QUANT
HCV Quantitative Log: 6.64 {Log} — ABNORMAL HIGH (ref <1.18)
HCV Quantitative: 4318430 IU/mL — ABNORMAL HIGH (ref <15)

## 2014-06-18 LAB — HEPATITIS PANEL, ACUTE
HCV Ab: REACTIVE — AB
Hep A IgM: NONREACTIVE
Hep B C IgM: NONREACTIVE
Hepatitis B Surface Ag: NEGATIVE

## 2014-06-18 LAB — MAGNESIUM: Magnesium: 1.8 mg/dL (ref 1.5–2.5)

## 2014-06-18 MED ORDER — TRAZODONE HCL 50 MG PO TABS: 50.0000 mg | ORAL_TABLET | Freq: Every evening | ORAL | Status: DC | PRN

## 2014-06-18 MED ORDER — HYDROXYZINE HCL 25 MG PO TABS
25.0000 mg | ORAL_TABLET | Freq: Four times a day (QID) | ORAL | Status: DC | PRN
Start: 2014-06-18 — End: 2014-10-01

## 2014-06-18 NOTE — Progress Notes (Signed)
  [facility] Adult Case Management Discharge Plan :  Will you be returning to the same living situation after discharge:  No. pt plans to stay with her cousin, Abe People temporarily at d/c until she is able to get locks changed at her home.   At discharge, do you have transportation home?: Yes,  pt's cousin, Abe People is coming at 2pm to pick up pt.  Do you have the ability to pay for your medications: Yes,  Medicare and CPT Medicaid  Release of information consent forms completed and submitted to Medical Records by CSW.  Patient to Follow up at: Follow-up Information    Follow up with Tamela Gammon On 06/21/2014.   Why:  Walk in between 7:45AM-10:30AM for hospital follow-up/assessment for services. Please bring medicaid card and photo ID. Referral number: [id number]   Contact information:   [address], [place] [address] Phone: [phone] Fax: [phone]      Patient denies SI/HI: Yes,  during group/self report.    Safety Planning and Suicide Prevention discussed: Yes,  SPE completed with pt's cousin. SPI pamphlet provided to pt and she was encouraged to share information with support network, ask questions, and talk about any concerns relating to SPE.  Have you used any form of tobacco in the last 30 days? (Cigarettes, Smokeless Tobacco, Cigars, and/or Pipes): Yes  Has patient been referred to the Quitline?: Patient refused referral  Smart, Alicia Amel  06/18/2014, 9:35 AM

## 2014-06-18 NOTE — Discharge Summary (Signed)
Physician Discharge Summary Note  Patient:  [name] is an 44 y.o., female MRN:  [id number] DOB:  [birth year] Patient phone:  [phone] (home)  Patient address:   [place] [address],  Total Time spent with patient: Greater than 30 minutes  Date of Admission:  06/16/2014  Date of Discharge: 06/18/14  Reason for Admission: Opioid detox  Principal Problem: Opioid dependence Discharge Diagnoses: Patient Active Problem List   Diagnosis Date Noted  . Opioid dependence [F11.20] 06/17/2014  . Substance induced mood disorder [F19.94] 06/17/2014  . Polysubstance abuse [F19.10] 06/16/2014    Class: Chronic  . Suicide ideation [R45.851] 06/16/2014    Class: Acute  . PFS (patellofemoral syndrome) [M22.2X9] 06/06/2012  . Obesity, unspecified [E66.9] 06/06/2012   Musculoskeletal: Strength & Muscle Tone: within normal limits Gait & Station: normal Patient leans: N/A  Psychiatric Specialty Exam: Physical Exam  Psychiatric: Her speech is normal and behavior is normal. Judgment and thought content normal. Her mood appears not anxious. Her affect is not angry, not blunt, not labile and not inappropriate. Cognition and memory are normal. She does not exhibit a depressed mood.    Review of Systems  Constitutional: Negative.   HENT: Negative.   Eyes: Negative.   Respiratory: Negative.   Cardiovascular: Negative.   Gastrointestinal: Negative.   Genitourinary: Negative.   Musculoskeletal: Negative.   Skin: Negative.   Neurological: Negative.   Endo/Heme/Allergies: Negative.   Psychiatric/Behavioral: Positive for depression (Stable) and substance abuse (Polysubstance dependence). Negative for suicidal ideas and hallucinations. The patient has insomnia (Stable). The patient is not nervous/anxious.     Blood pressure 104/57, pulse 55, temperature 97.9 F (36.6 C), temperature source Oral, resp. rate 18, height 5' 1"  (1.549 m), weight 85.73 kg (189 lb), last menstrual period  06/01/2014.Body mass index is 35.73 kg/(m^2).  See MD's SRA   Past Medical History:  Past Medical History  Diagnosis Date  . COPD (chronic obstructive pulmonary disease)   . Anxiety   . Panic attacks   . Pulmonary embolism   . Chronic back pain   . DDD (degenerative disc disease)   . Chronic knee pain   . Polysubstance abuse     cocaine, opiates, marijuana, amphetamines, "molly," crystal meth    Past Surgical History  Procedure Laterality Date  . Cesarean section    . Nose surgery    . Tubes tied     Family History:  Family History  Problem Relation Age of Onset  . Arthritis    . Lung disease    . Cancer    . Asthma     Social History:  History  Alcohol Use No     History  Drug Use  . Yes  . Special: IV, Cocaine, Marijuana, Methamphetamines    Comment: Crystal meth, "molly," narcotics    History   Social History  . Marital Status: Divorced    Spouse Name: N/A  . Number of Children: N/A  . Years of Education: 9th grade   Occupational History  .     Social History Main Topics  . Smoking status: Current Every Day Smoker -- 0.50 packs/day for 25 years    Types: Cigarettes  . Smokeless tobacco: Never Used  . Alcohol Use: No  . Drug Use: Yes    Special: IV, Cocaine, Marijuana, Methamphetamines     Comment: Crystal meth, "molly," narcotics  . Sexual Activity: Yes    Birth Control/ Protection: Surgical   Other Topics Concern  .  None   Social History Narrative   Risk to Self: Is patient at risk for suicide?: Yes What has been your use of drugs/alcohol within the last 12 months?: Heroin ($50 - 80 per day) or Opana (2 40 mg per day) by IV or Roxycotin and Dilaudid OR Methaphetomine and Cocaine $200 once weekly and THC Risk to Others: No Prior Inpatient Therapy: No Prior Outpatient Therapy: No  Level of Care:  OP  Hospital Course:  Patient states that she went to [facility] last night requesting help to get into a rehab facility; Patient states  that she uses mostly Opana/heroin "shooting up" if I can't get anything else I use cocaine. State cocaine shot up two days ago and also shot heroin up 2 days ago. Patient held out her arms and has multiple areas where she shoots up drugs.  Patient states that she is not suicidal. Patient states that a family member told her that she would only get help if she told the people she was suicidal. Patient denies homicidal ideation, psychosis, and paranoia.  [name] checked her self in to the hospital requesting help to get into a drug rehabilitation treatment. She reported having been using IV drug Opana & Heroin. She also endorsed using cocaine when she could not get either of the drugs. She denied being or feeling suicidal. Stated claimed being suicidal at the [facility] ED in an effort to get into the hospital. She received Clonidine detoxification treatment protocols for opioid detox. However, her detoxification treatment was brief as [name] made another request for discharge to continue substance abuse treatment on an outpatient basis. She says she needed to go home to be with her children. She indicated also that she realized that she has medicaid and many rehabilitation treatment centers do not accept medicaid any way. She was instructed & encouraged to stay in this hospital to get her detox treatment completed to prevent her from going into opioid withdrawal. She did accept this offer and stayed for 2 days.  [name] is currently stable. She is not presenting with any withdrawal symptoms of substances, she denies symptoms of depression, Anxiety SIHI, AVH, delusional thoughts and or paranoia. She is currently being discharged to follow-up care at the [facility] in [place], [place]. She is provided with all the necessary information needed to make this appointment without problems. She is discharged on Hydroxyzine 25 mg Q 6 hours for anxiety & Trazodone 50 mg for insomnia. She received from the [facility], a 4 days worth, supply samples of her [employer] discharge medications. She left [facility] with all personal belongings in no distress. Transpsortation cousin, [place].  Consults:  psychiatry  Significant Diagnostic Studies:  labs: CBC with diff, CMP, UDS, toxicology tests, U/A, results reviewed, stable  Discharge Vitals:   Blood pressure 104/57, pulse 55, temperature 97.9 F (36.6 C), temperature source Oral, resp. rate 18, height $RemoveBe'5\' 1"'ygexwkIow$  (1.549 m), weight 85.73 kg (189 lb), last menstrual period 06/01/2014. Body mass index is 35.73 kg/(m^2). Lab Results:   Results for orders placed or performed during the hospital encounter of 06/16/14 (from the past 72 hour(s))  Urinalysis, Routine w reflex microscopic     Status: Abnormal   Collection Time: 06/17/14 12:00 PM  Result Value Ref Range   Color, Urine YELLOW YELLOW   APPearance TURBID (A) CLEAR   Specific Gravity, Urine 1.012 1.005 - 1.030   pH 6.0 5.0 - 8.0   Glucose, UA NEGATIVE NEGATIVE mg/dL   Hgb  urine dipstick MODERATE (A) NEGATIVE   Bilirubin Urine NEGATIVE NEGATIVE   Ketones, ur NEGATIVE NEGATIVE mg/dL   Protein, ur NEGATIVE NEGATIVE mg/dL   Urobilinogen, UA 0.2 0.0 - 1.0 mg/dL   Nitrite NEGATIVE NEGATIVE   Leukocytes, UA MODERATE (A) NEGATIVE    Comment: Performed at [facility]  Urine microscopic-add on     Status: Abnormal   Collection Time: 06/17/14 12:00 PM  Result Value Ref Range   Squamous Epithelial / LPF FEW (A) RARE   WBC, UA 11-20 <3 WBC/hpf   Bacteria, UA MANY (A) RARE    Comment: Performed at [facility]  Hepatitis panel, acute     Status: Abnormal   Collection Time: 06/17/14  8:14 PM  Result Value Ref Range   Hepatitis B Surface Ag NEGATIVE NEGATIVE   HCV Ab Reactive (A) NEGATIVE   Hep A IgM NON REACTIVE NON REACTIVE    Comment: (NOTE) Effective February 25, 2014, Hepatitis Acute Panel (test code 22940) will be revised to automatically reflex to the Hepatitis C Viral  RNA, Quantitative, Real-Time PCR assay if the Hepatitis C antibody screening result is Reactive. This action is being taken to ensure that the CDC/USPSTF recommended HCV diagnostic algorithm with the appropriate test reflex needed for accurate interpretation is followed.    Hep B C IgM NON REACTIVE NON REACTIVE    Comment: (NOTE) High levels of Hepatitis B Core IgM antibody are detectable during the acute stage of Hepatitis B. This antibody is used to differentiate current from past HBV infection. Performed at [place] metabolic panel     Status: Abnormal   Collection Time: 06/18/14  6:32 AM  Result Value Ref Range   Sodium 138 135 - 145 mmol/L   Potassium 4.4 3.5 - 5.1 mmol/L   Chloride 108 96 - 112 mmol/L   CO2 24 19 - 32 mmol/L   Glucose, Bld 116 (H) 70 - 99 mg/dL   BUN 12 6 - 23 mg/dL   Creatinine, Ser 0.92 0.50 - 1.10 mg/dL   Calcium 8.6 8.4 - 10.5 mg/dL   GFR calc non Af Amer 76 (L) >90 mL/min   GFR calc Af Amer 88 (L) >90 mL/min    Comment: (NOTE) The eGFR has been calculated using the CKD EPI equation. This calculation has not been validated in all clinical situations. eGFR's persistently <90 mL/min signify possible Chronic Kidney Disease.    Anion gap 6 5 - 15    Comment: Performed at [facility]  Magnesium     Status: None   Collection Time: 06/18/14  6:32 AM  Result Value Ref Range   Magnesium 1.8 1.5 - 2.5 mg/dL    Comment: Performed at [facility]    Physical Findings: AIMS: Facial and Oral Movements Muscles of Facial Expression: None, normal Lips and Perioral Area: None, normal Jaw: None, normal Tongue: None, normal,Extremity Movements Upper (arms, wrists, hands, fingers): None, normal Lower (legs, knees, ankles, toes): None, normal, Trunk Movements Neck, shoulders, hips: None, normal, Overall Severity Severity of abnormal movements (highest score from questions above): None,  normal Incapacitation due to abnormal movements: None, normal Patient's awareness of abnormal movements (rate only patient's report): No Awareness, Dental Status Current problems with teeth and/or dentures?: No Does patient usually wear dentures?: No  CIWA:  CIWA-Ar Total: 1 COWS:  COWS Total Score: 3  See Psychiatric Specialty Exam and Suicide Risk Assessment completed by Attending Physician prior  to discharge.  Discharge destination:  Home  Is patient on multiple antipsychotic therapies at discharge:  No   Has Patient had three or more failed trials of antipsychotic monotherapy by history:  No  Recommended Plan for Multiple Antipsychotic Therapies: NA    Medication List    STOP taking these medications        Aspirin-Salicylamide-Caffeine [id number] MG Tabs     ibuprofen 200 MG tablet  Commonly known as:  ADVIL,MOTRIN      TAKE these medications      Indication   hydrOXYzine 25 MG tablet  Commonly known as:  ATARAX/VISTARIL  Take 1 tablet (25 mg total) by mouth every 6 (six) hours as needed for anxiety.   Indication:  Tension, Anxiety     traZODone 50 MG tablet  Commonly known as:  DESYREL  Take 1 tablet (50 mg total) by mouth at bedtime and may repeat dose one time if needed. For sleep   Indication:  Trouble Sleeping       Follow-up Information    Follow up with Tamela Gammon On 06/21/2014.   Why:  Walk in between 7:45AM-10:30AM for hospital follow-up/assessment for services. Please bring medicaid card and photo ID. Referral number: [id number]   Contact information:   [place],  [address] Phone: [phone] Fax: [phone]     Follow-up recommendations:  Activity:  As tolerated Diet: As recommended by your primary care doctor. Keep all scheduled follow-up appointments as recommended.  Comments: Take all your medications as prescribed by your mental healthcare provider. Report any adverse effects and or reactions from your medicines to your  outpatient provider promptly. Patient is instructed and cautioned to not engage in alcohol and or illegal drug use while on prescription medicines. In the event of worsening symptoms, patient is instructed to call the crisis hotline, 911 and or go to the nearest ED for appropriate evaluation and treatment of symptoms. Follow-up with your primary care provider for your other medical issues, concerns and or health care needs.   Total Discharge Time: Greater than 30 minutes  Signed: Encarnacion Slates, PMHNP-BC 06/18/2014, 10:17 AM  I personally assessed the patient and formulated the plan Geralyn Flash A. Sabra Heck, M.D.

## 2014-06-18 NOTE — BHH Suicide Risk Assessment (Signed)
[facility] Discharge Suicide Risk Assessment   Demographic Factors:  Caucasian  Total Time spent with patient: 30 minutes  Musculoskeletal: Strength & Muscle Tone: within normal limits Gait & Station: normal Patient leans: N/A  Psychiatric Specialty Exam: Physical Exam  Review of Systems  Constitutional: Negative.   HENT: Negative.   Eyes: Negative.   Respiratory: Negative.   Cardiovascular: Negative.   Gastrointestinal: Negative.   Genitourinary: Negative.   Musculoskeletal: Negative.   Skin: Negative.   Neurological: Negative.   Endo/Heme/Allergies: Negative.   Psychiatric/Behavioral: Positive for substance abuse. The patient is nervous/anxious.     Blood pressure 104/57, pulse 55, temperature 97.9 F (36.6 C), temperature source Oral, resp. rate 18, height 5\' 1"  (1.549 m), weight 85.73 kg (189 lb), last menstrual period 06/01/2014.Body mass index is 35.73 kg/(m^2).  General Appearance: Fairly Groomed  Engineer, water::  Fair  Speech:  Clear and [id number]  Volume:  Normal  Mood:  Euthymic  Affect:  Appropriate  Thought Process:  Coherent and Goal Directed  Orientation:  Full (Time, Place, and Person)  Thought Content:  plans as she moves on, relapse prevention plan  Suicidal Thoughts:  No  Homicidal Thoughts:  No  Memory:  Immediate;   Fair Recent;   Fair Remote;   Fair  Judgement:  Fair  Insight:  Present  Psychomotor Activity:  Normal  Concentration:  Fair  Recall:  [employer] [place]  Language: Fair  Akathisia:  No  Handed:  Right  AIMS (if indicated):     Assets:  Desire for Improvement Housing Social Support  Sleep:  Number of Hours: 5.25  Cognition: WNL  ADL's:  Intact   Have you used any form of tobacco in the last 30 days? (Cigarettes, Smokeless Tobacco, Cigars, and/or Pipes): Yes  Has this patient used any form of tobacco in the last 30 days? (Cigarettes, Smokeless Tobacco, Cigars, and/or Pipes) Yes, A prescription for an FDA-approved tobacco  cessation medication was offered at discharge and the patient refused  Mental Status Per Nursing Assessment::   On Admission:  Suicidal ideation indicated by others  Current Mental Status by Physician: In full contact with reality. There are no active S/S of withdrawal. No active SI plans or intent. States she is committed to abstinence. She was made aware of her Hep C status and states that she would get her life together to fight it as she has reasons why to go on. States she wants to be there for her family   Loss Factors: Decline in physical health  Historical Factors: NA  Risk Reduction Factors:   Sense of responsibility to family, Living with another person, especially a relative and Positive social support  Continued Clinical Symptoms:  Alcohol/Substance Abuse/Dependencies  Cognitive Features That Contribute To Risk:  Closed-mindedness, Polarized thinking and Thought constriction (tunnel vision)    Suicide Risk:  Minimal: No identifiable suicidal ideation.  Patients presenting with no risk factors but with morbid ruminations; may be classified as minimal risk based on the severity of the depressive symptoms  Principal Problem: Opioid dependence Discharge Diagnoses:  Patient Active Problem List   Diagnosis Date Noted  . Opioid dependence [F11.20] 06/17/2014  . Substance induced mood disorder [F19.94] 06/17/2014  . Polysubstance abuse [F19.10] 06/16/2014    Class: Chronic  . Suicide ideation [R45.851] 06/16/2014    Class: Acute  . PFS (patellofemoral syndrome) [M22.2X9] 06/06/2012  . Obesity, unspecified [E66.9] 06/06/2012    Follow-up Information    Follow up with [facility]  On 06/21/2014.   Why:  Walk in between 7:45AM-10:30AM for hospital follow-up/assessment for services. Please bring medicaid card and photo ID. Referral number: [id number]   Contact information:   [address] [address] [place], [place] [address] Phone: [phone] Fax: [phone]      Plan Of  Care/Follow-up recommendations:  Activity:  as tolerated Diet:  hear healthy Follow up [facility] as above and your PCP  Is patient on multiple antipsychotic therapies at discharge:  No   Has Patient had three or more failed trials of antipsychotic monotherapy by history:  No  Recommended Plan for Multiple Antipsychotic Therapies: NA    Shantay Sonn A 06/18/2014, 11:46 AM

## 2014-06-18 NOTE — BHH Group Notes (Signed)
The focus of this group is to educate the patient on the purpose and policies of crisis stabilization and provide a format to answer questions about their admission.  The group details unit policies and expectations of patients while admitted. Patient attended this group. 

## 2014-06-18 NOTE — Progress Notes (Signed)
Patient discharged per physician order; patient denies SI/HI and A/V hallucinations; patient received samples, prescriptions, and copy of AVS after it was reviewed; patient had no other questions or concerns at this time; patient verbalized and signed that she received all belongings; patient left the unit ambulatory  

## 2014-06-19 LAB — URINE CULTURE
Colony Count: 35000
Special Requests: NORMAL

## 2014-06-21 NOTE — Progress Notes (Signed)
Patient Discharge Instructions:  After Visit Summary (AVS):   Faxed to:  06/21/14 Discharge Summary Note:   Faxed to:  06/21/14 Psychiatric Admission Assessment Note:   Faxed to:  06/21/14 Suicide Risk Assessment - Discharge Assessment:   Faxed to:  06/21/14 Faxed/Sent to the Next Level Care provider:  06/21/14 Faxed to [facility] @ [place], 06/21/2014, 3:16 PM

## 2014-07-11 ENCOUNTER — Encounter (HOSPITAL_COMMUNITY): Payer: Self-pay

## 2014-07-11 ENCOUNTER — Emergency Department (HOSPITAL_COMMUNITY): Payer: Medicare Other

## 2014-07-11 ENCOUNTER — Inpatient Hospital Stay (HOSPITAL_COMMUNITY)
Admission: EM | Admit: 2014-07-11 | Discharge: 2014-07-16 | DRG: 602 | Disposition: A | Discharge status: Home / Self Care | Payer: Medicare Other | Attending: Internal Medicine | Admitting: Internal Medicine

## 2014-07-11 DIAGNOSIS — A599 Trichomoniasis, unspecified: Secondary | ICD-10-CM | POA: Diagnosis present

## 2014-07-11 DIAGNOSIS — L03113 Cellulitis of right upper limb: Secondary | ICD-10-CM | POA: Diagnosis present

## 2014-07-11 DIAGNOSIS — F17210 Nicotine dependence, cigarettes, uncomplicated: Secondary | ICD-10-CM | POA: Diagnosis present

## 2014-07-11 DIAGNOSIS — R509 Fever, unspecified: Secondary | ICD-10-CM

## 2014-07-11 DIAGNOSIS — F410 Panic disorder [episodic paroxysmal anxiety] without agoraphobia: Secondary | ICD-10-CM | POA: Diagnosis present

## 2014-07-11 DIAGNOSIS — G8929 Other chronic pain: Secondary | ICD-10-CM | POA: Diagnosis present

## 2014-07-11 DIAGNOSIS — L03114 Cellulitis of left upper limb: Principal | ICD-10-CM | POA: Diagnosis present

## 2014-07-11 DIAGNOSIS — Z86711 Personal history of pulmonary embolism: Secondary | ICD-10-CM

## 2014-07-11 DIAGNOSIS — L0291 Cutaneous abscess, unspecified: Secondary | ICD-10-CM

## 2014-07-11 DIAGNOSIS — F1120 Opioid dependence, uncomplicated: Secondary | ICD-10-CM | POA: Diagnosis present

## 2014-07-11 DIAGNOSIS — B1920 Unspecified viral hepatitis C without hepatic coma: Secondary | ICD-10-CM

## 2014-07-11 DIAGNOSIS — Z825 Family history of asthma and other chronic lower respiratory diseases: Secondary | ICD-10-CM

## 2014-07-11 DIAGNOSIS — F209 Schizophrenia, unspecified: Secondary | ICD-10-CM | POA: Diagnosis present

## 2014-07-11 DIAGNOSIS — F1510 Other stimulant abuse, uncomplicated: Secondary | ICD-10-CM | POA: Diagnosis present

## 2014-07-11 DIAGNOSIS — L0390 Cellulitis, unspecified: Secondary | ICD-10-CM

## 2014-07-11 DIAGNOSIS — F1910 Other psychoactive substance abuse, uncomplicated: Secondary | ICD-10-CM | POA: Diagnosis present

## 2014-07-11 DIAGNOSIS — M7989 Other specified soft tissue disorders: Secondary | ICD-10-CM | POA: Diagnosis not present

## 2014-07-11 DIAGNOSIS — E43 Unspecified severe protein-calorie malnutrition: Secondary | ICD-10-CM | POA: Diagnosis present

## 2014-07-11 DIAGNOSIS — F1210 Cannabis abuse, uncomplicated: Secondary | ICD-10-CM | POA: Diagnosis present

## 2014-07-11 DIAGNOSIS — F319 Bipolar disorder, unspecified: Secondary | ICD-10-CM | POA: Diagnosis present

## 2014-07-11 DIAGNOSIS — L02414 Cutaneous abscess of left upper limb: Secondary | ICD-10-CM

## 2014-07-11 DIAGNOSIS — O99320 Drug use complicating pregnancy, unspecified trimester: Secondary | ICD-10-CM | POA: Insufficient documentation

## 2014-07-11 DIAGNOSIS — Z6834 Body mass index (BMI) 34.0-34.9, adult: Secondary | ICD-10-CM

## 2014-07-11 DIAGNOSIS — F1410 Cocaine abuse, uncomplicated: Secondary | ICD-10-CM | POA: Diagnosis present

## 2014-07-11 DIAGNOSIS — L02413 Cutaneous abscess of right upper limb: Secondary | ICD-10-CM | POA: Insufficient documentation

## 2014-07-11 DIAGNOSIS — J449 Chronic obstructive pulmonary disease, unspecified: Secondary | ICD-10-CM | POA: Diagnosis present

## 2014-07-11 HISTORY — DX: Unspecified viral hepatitis C without hepatic coma: B19.20

## 2014-07-11 LAB — CBC WITH DIFFERENTIAL/PLATELET
Basophils Absolute: 0 10*3/uL (ref 0.0–0.1)
Basophils Relative: 0 % (ref 0–1)
Eosinophils Absolute: 0.2 10*3/uL (ref 0.0–0.7)
Eosinophils Relative: 1 % (ref 0–5)
HCT: 33.2 % — ABNORMAL LOW (ref 36.0–46.0)
Hemoglobin: 11.2 g/dL — ABNORMAL LOW (ref 12.0–15.0)
Lymphocytes Relative: 25 % (ref 12–46)
Lymphs Abs: 3 10*3/uL (ref 0.7–4.0)
MCH: 29.4 pg (ref 26.0–34.0)
MCHC: 33.7 g/dL (ref 30.0–36.0)
MCV: 87.1 fL (ref 78.0–100.0)
Monocytes Absolute: 0.8 10*3/uL (ref 0.1–1.0)
Monocytes Relative: 7 % (ref 3–12)
Neutro Abs: 8 10*3/uL — ABNORMAL HIGH (ref 1.7–7.7)
Neutrophils Relative %: 67 % (ref 43–77)
Platelets: 228 10*3/uL (ref 150–400)
RBC: 3.81 MIL/uL — ABNORMAL LOW (ref 3.87–5.11)
RDW: 14.5 % (ref 11.5–15.5)
WBC: 12 10*3/uL — ABNORMAL HIGH (ref 4.0–10.5)

## 2014-07-11 MED ORDER — SODIUM CHLORIDE 0.9 % IV SOLN
INTRAVENOUS | Status: DC
  Administered 2014-07-12 (×2): via INTRAVENOUS

## 2014-07-11 MED ORDER — ACETAMINOPHEN 500 MG PO TABS
1000.0000 mg | ORAL_TABLET | Freq: Once | ORAL | Status: AC
  Administered 2014-07-11: 1000 mg via ORAL

## 2014-07-11 MED ORDER — ACETAMINOPHEN 500 MG PO TABS
ORAL_TABLET | ORAL | Status: AC
  Filled 2014-07-11: qty 2

## 2014-07-11 NOTE — ED Notes (Signed)
Pt to department via EMS.  Reporting abscess on inner arms bilaterally.  Pt repots she began having problems yesterday.  States that  Swelling started yesterday

## 2014-07-11 NOTE — ED Provider Notes (Signed)
CSN: [id number]     Arrival date & time 07/11/14  2237 History  This chart was scribed for Julie Porter, MD by Delphia Grates, ED Scribe. This patient was seen in room APA04/APA04 and the patient's care was started at 11:09 PM.    Chief Complaint  Patient presents with  . Abscess    The history is provided by the patient. No language interpreter was used.     HPI Comments: [name] is a 44 y.o. female, with history of COPD, anxiety, PE, DDD, chronic back pain, chronic knee pain, and polysubstance abuse, brought in by ambulance, who presents to the Emergency Department complaining of possible abscesses to the bilateral forearms with associated redness and swelling for the past 2-3 days. Patient reports her last heroin use approximately 1 month ago on June 13, 2014 and states she was sent to [facility] on March 4th. Patient states she received a referral to go to [facility]. She states she is being treated as outpatient, but is insisting that she is needing to be  treated as inpatient, although she states she has not used since 3/3. Pt states she has been using IV drugs including heroin for the past year. Patient is also complaining of chills and fever (Tmax 102.8 F at home; triage temp 101.6 F) onset last night at 0400. In addition, she reports nausea, vomiting and dizziness with aching burning pain the arms.  Patient is right hand dominant and injects on the left and her right. Patient mentions 1 episode of chest pain with ambulation today that radiates from the epigastric region to the substernal area. This happened while walking up a steep hill. She also mentions multiple tick bites and scrapes from briars on the upper body from cleaning brush recently. Patient was informed of elevated enzymes c/w hepatitis while in [facility]. Patient has taken Tylenol and "old" Roxicodone for her symptoms at 1000 this morning. She is currently on presciption meds prescribed by provider at [facility], hydroxyzine and  trazodone. Patient is a current 1.5 PPD smoker and denies EtOH consumption. She is currently on disability for bipolar, schizophrenia,  and chronic back pain for which she used to receive injections in [place], however, has not received this treatment for "awhile" since she started drugs.  No PCP  Past Medical History  Diagnosis Date  . COPD (chronic obstructive pulmonary disease)   . Anxiety   . Panic attacks   . Pulmonary embolism   . Chronic back pain   . DDD (degenerative disc disease)   . Chronic knee pain   . Polysubstance abuse     cocaine, opiates, marijuana, amphetamines, "molly," crystal meth   Past Surgical History  Procedure Laterality Date  . Cesarean section    . Nose surgery    . Tubes tied     Family History  Problem Relation Age of Onset  . Arthritis    . Lung disease    . Cancer    . Asthma     History  Substance Use Topics  . Smoking status: Current Every Day Smoker -- 0.50 packs/day for 25 years    Types: Cigarettes  . Smokeless tobacco: Never Used  . Alcohol Use: No   Smokes 1 1/2 ppd No ETOH On disability for bipolar,schizophrenia and chronic LBP   OB History    Gravida Para Term Preterm AB TAB SAB Ectopic Multiple Living   2 2 2       2      Review  of Systems  Constitutional: Positive for fever and chills.  Cardiovascular: Positive for chest pain.  Gastrointestinal: Positive for nausea and vomiting.  Musculoskeletal: Positive for myalgias.  Skin: Positive for color change and wound (abscess).  All other systems reviewed and are negative.     Allergies  Doxycycline; Pyridium; and Tramadol  Home Medications   Prior to Admission medications   Medication Sig Start Date End Date Taking? Authorizing Provider  hydrOXYzine (ATARAX/VISTARIL) 25 MG tablet Take 1 tablet (25 mg total) by mouth every 6 (six) hours as needed for anxiety. 06/18/14   Encarnacion Slates, NP  traZODone (DESYREL) 50 MG tablet Take 1 tablet (50 mg total) by mouth at  bedtime and may repeat dose one time if needed. For sleep 06/18/14   Encarnacion Slates, NP   Triage Vitals: BP 145/78 mmHg  Pulse 83  Temp(Src) 101.6 F (38.7 C) (Oral)  Resp 18  Ht 5\' 1"  (1.549 m)  Wt 180 lb (81.647 kg)  BMI 34.03 kg/m2  SpO2 98%  LMP 06/23/2014  Vital signs normal except for fever   Physical Exam  Constitutional: She is oriented to person, place, and time. She appears well-developed and well-nourished.  Non-toxic appearance. She does not appear ill. No distress.  HENT:  Head: Normocephalic and atraumatic.  Right Ear: External ear normal.  Left Ear: External ear normal.  Nose: Nose normal. No mucosal edema or rhinorrhea.  Mouth/Throat: Oropharynx is clear and moist and mucous membranes are normal. No dental abscesses or uvula swelling.  Eyes: Conjunctivae and EOM are normal. Pupils are equal, round, and reactive to light.  Neck: Normal range of motion and full passive range of motion without pain. Neck supple.  Cardiovascular: Normal rate, regular rhythm and normal heart sounds.  Exam reveals no gallop and no friction rub.   No murmur heard. Pulmonary/Chest: Effort normal and breath sounds normal. No respiratory distress. She has no wheezes. She has no rhonchi. She has no rales. She exhibits no tenderness and no crepitus.  Abdominal: Soft. Normal appearance and bowel sounds are normal. She exhibits no distension. There is no tenderness. There is no rebound and no guarding.  Musculoskeletal: Normal range of motion. She exhibits no edema or tenderness.  Moves all extremities well.   Neurological: She is alert and oriented to person, place, and time. She has normal strength. No cranial nerve deficit.  Skin: Skin is warm, dry and intact. No rash noted. There is erythema. No pallor.  Left forearm: Diffuse redness and swelling with one area that looks like a developing abscess on the volar aspect of the wrist.  Right forearm: there is a large area on the mid volar aspect that  looks like a developing abscess with fresh tracks marks on the antecubital area.Marland Kitchen  Psychiatric: Her behavior is normal. Her mood appears anxious. Her speech is rapid and/or pressured.  Nursing note and vitals reviewed.              ED Course  Procedures (including critical care time)  Medications  0.9 %  sodium chloride infusion ( Intravenous New Bag/Given 07/12/14 0012)  sodium chloride 0.9 % bolus 1,000 mL (not administered)  vancomycin (VANCOCIN) IVPB 1000 mg/200 mL premix (not administered)  piperacillin-tazobactam (ZOSYN) IVPB 3.375 g (not administered)  acetaminophen (TYLENOL) tablet 1,000 mg (1,000 mg Oral Given 07/11/14 2246)     DIAGNOSTIC STUDIES: Oxygen Saturation is 98% on room air, normal by my interpretation.    COORDINATION OF CARE: At 2318 Discussed treatment  plan with patient which includes ABX and possible admission. Patient agrees.   01:19 Dr Marin Comment in the ED wants vancomycin and zosyn to be started.   Labs Review  Results for orders placed or performed during the hospital encounter of 07/11/14  Culture, blood (routine x 2)  Result Value Ref Range   Specimen Description BLOOD RIGHT FOREARM    Special Requests      BOTTLES DRAWN AEROBIC AND ANAEROBIC AEB 8CC ANA 5CC   Culture PENDING    Report Status PENDING   Culture, blood (routine x 2)  Result Value Ref Range   Specimen Description BLOOD RIGHT ANTECUBITAL    Special Requests BOTTLES DRAWN AEROBIC AND ANAEROBIC 6CC EACH    Culture PENDING    Report Status PENDING   Culture, blood (single)  Result Value Ref Range   Specimen Description BLOOD RIGHT HAND    Special Requests      BOTTLES DRAWN AEROBIC AND ANAEROBIC AEB 6CC ANA [place]   Culture PENDING    Report Status PENDING   Comprehensive metabolic panel  Result Value Ref Range   Sodium 135 135 - 145 mmol/L   Potassium 3.4 (L) 3.5 - 5.1 mmol/L   Chloride 101 96 - 112 mmol/L   CO2 28 19 - 32 mmol/L   Glucose, Bld 107 (H) 70 - 99 mg/dL   BUN 8  6 - 23 mg/dL   Creatinine, Ser 0.90 0.50 - 1.10 mg/dL   Calcium 8.4 8.4 - 10.5 mg/dL   Total Protein 7.1 6.0 - 8.3 g/dL   Albumin 3.2 (L) 3.5 - 5.2 g/dL   AST 57 (H) 0 - 37 U/L   ALT 95 (H) 0 - 35 U/L   Alkaline Phosphatase 64 39 - 117 U/L   Total Bilirubin 0.4 0.3 - 1.2 mg/dL   GFR calc non Af Amer 78 (L) >90 mL/min   GFR calc Af Amer >90 >90 mL/min   Anion gap 6 5 - 15  CBC with Differential  Result Value Ref Range   WBC 12.0 (H) 4.0 - 10.5 K/uL   RBC 3.81 (L) 3.87 - 5.11 MIL/uL   Hemoglobin 11.2 (L) 12.0 - 15.0 g/dL   HCT 33.2 (L) 36.0 - 46.0 %   MCV 87.1 78.0 - 100.0 fL   MCH 29.4 26.0 - 34.0 pg   MCHC 33.7 30.0 - 36.0 g/dL   RDW 14.5 11.5 - 15.5 %   Platelets 228 150 - 400 K/uL   Neutrophils Relative % 67 43 - 77 %   Neutro Abs 8.0 (H) 1.7 - 7.7 K/uL   Lymphocytes Relative 25 12 - 46 %   Lymphs Abs 3.0 0.7 - 4.0 K/uL   Monocytes Relative 7 3 - 12 %   Monocytes Absolute 0.8 0.1 - 1.0 K/uL   Eosinophils Relative 1 0 - 5 %   Eosinophils Absolute 0.2 0.0 - 0.7 K/uL   Basophils Relative 0 0 - 1 %   Basophils Absolute 0.0 0.0 - 0.1 K/uL  Troponin I  Result Value Ref Range   Troponin I <0.03 <0.031 ng/mL  Urine rapid drug screen (hosp performed)  Result Value Ref Range   Opiates NONE DETECTED NONE DETECTED   Cocaine POSITIVE (A) NONE DETECTED   Benzodiazepines NONE DETECTED NONE DETECTED   Amphetamines NONE DETECTED NONE DETECTED   Tetrahydrocannabinol NONE DETECTED NONE DETECTED   Barbiturates NONE DETECTED NONE DETECTED  Urinalysis, Routine w reflex microscopic  Result Value Ref Range   Color, Urine  YELLOW YELLOW   APPearance CLEAR CLEAR   Specific Gravity, Urine 1.020 1.005 - 1.030   pH 7.0 5.0 - 8.0   Glucose, UA NEGATIVE NEGATIVE mg/dL   Hgb urine dipstick NEGATIVE NEGATIVE   Bilirubin Urine NEGATIVE NEGATIVE   Ketones, ur NEGATIVE NEGATIVE mg/dL   Protein, ur NEGATIVE NEGATIVE mg/dL   Urobilinogen, UA 0.2 0.0 - 1.0 mg/dL   Nitrite NEGATIVE NEGATIVE    Leukocytes, UA TRACE (A) NEGATIVE  Urine microscopic-add on  Result Value Ref Range   Squamous Epithelial / LPF FEW (A) RARE   WBC, UA 3-6 <3 WBC/hpf   RBC / HPF 0-2 <3 RBC/hpf   Bacteria, UA FEW (A) RARE   Urine-Other TRICHOMONAS PRESENT    Laboratory interpretation all normal except positive UDS although she denied any current drug use, leukocytosis, mild anemia, trichomoniasis, minor elevation of LFTs consistent with recent diagnosis of hepatitis C    Results for orders placed or performed during the hospital encounter of 06/16/14  Hepatitis panel, acute  Result Value Ref Range   Hepatitis B Surface Ag NEGATIVE NEGATIVE   HCV Ab Reactive (A) NEGATIVE   Hep A IgM NON REACTIVE NON REACTIVE   Hep B C IgM NON REACTIVE NON REACTIVE  Magnesium  Result Value Ref Range   Magnesium 1.8 1.5 - 2.5 mg/dL  HCV RNA quant  Result Value Ref Range   HCV Quantitative 3709643 (H) <15 IU/mL   HCV Quantitative Log 6.64 (H) <1.18 log 10       Imaging Review Dg Chest 2 View  07/12/2014   CLINICAL DATA:  Chest pain, shortness of breath, IV drug user.  EXAM: CHEST  2 VIEW  COMPARISON:  Most recent chest radiograph 02/09/2013  FINDINGS: The lungs are hyperinflated with coarsened interstitial markings. Question of small focal opacity at the left costophrenic angle. There is otherwise no confluent airspace disease. The heart is at the upper limits of normal in size. There is no pleural effusion or pneumothorax. No acute osseous abnormalities.  IMPRESSION: 1. Hyperinflation with coarse interstitial markings, may reflect emphysema. 2. Questionable small focal opacity at the left costophrenic angle, may reflect atelectasis versus pneumonitis.   Electronically Signed   By: Jeb Levering M.D.   On: 07/12/2014 01:01     EKG Interpretation   Date/Time:  Friday July 12 2014 00:48:29 EDT Ventricular Rate:  72 PR Interval:  145 QRS Duration: 97 QT Interval:  394 QTC Calculation: 431 R Axis:   81 Text  Interpretation:  Sinus rhythm Low voltage, precordial leads Otherwise  within normal limits No significant change since last tracing 01 Dec 2013  Confirmed by [facility]  MD-I, Amjad Fikes ([id number]) on 07/12/2014 12:54:05 AM      MDM   IV drug abuser presents with abscesses in both forearms with fever and concern for possible endocarditis. She is being admitted for further treatment.    Final diagnoses:  Abscess of forearm, left  Abscess of forearm, right  IV drug abuse  Other specified fever   Plan admission  Julie Porter, MD, FACEP   I personally performed the services described in this documentation, which was scribed in my presence. The recorded information has been reviewed and considered.  Julie Porter, MD, Barbette Or, MD 07/12/14 [phone]

## 2014-07-12 ENCOUNTER — Encounter (HOSPITAL_COMMUNITY): Payer: Self-pay | Admitting: Internal Medicine

## 2014-07-12 ENCOUNTER — Inpatient Hospital Stay (HOSPITAL_COMMUNITY): Payer: Medicare Other

## 2014-07-12 DIAGNOSIS — Z6834 Body mass index (BMI) 34.0-34.9, adult: Secondary | ICD-10-CM | POA: Diagnosis not present

## 2014-07-12 DIAGNOSIS — F1120 Opioid dependence, uncomplicated: Secondary | ICD-10-CM | POA: Diagnosis present

## 2014-07-12 DIAGNOSIS — L03113 Cellulitis of right upper limb: Secondary | ICD-10-CM | POA: Diagnosis present

## 2014-07-12 DIAGNOSIS — F1910 Other psychoactive substance abuse, uncomplicated: Secondary | ICD-10-CM

## 2014-07-12 DIAGNOSIS — E43 Unspecified severe protein-calorie malnutrition: Secondary | ICD-10-CM | POA: Diagnosis present

## 2014-07-12 DIAGNOSIS — L02413 Cutaneous abscess of right upper limb: Secondary | ICD-10-CM | POA: Diagnosis present

## 2014-07-12 DIAGNOSIS — F1510 Other stimulant abuse, uncomplicated: Secondary | ICD-10-CM | POA: Diagnosis present

## 2014-07-12 DIAGNOSIS — L0291 Cutaneous abscess, unspecified: Secondary | ICD-10-CM | POA: Diagnosis not present

## 2014-07-12 DIAGNOSIS — F1210 Cannabis abuse, uncomplicated: Secondary | ICD-10-CM | POA: Diagnosis present

## 2014-07-12 DIAGNOSIS — F209 Schizophrenia, unspecified: Secondary | ICD-10-CM | POA: Diagnosis present

## 2014-07-12 DIAGNOSIS — B1920 Unspecified viral hepatitis C without hepatic coma: Secondary | ICD-10-CM | POA: Diagnosis present

## 2014-07-12 DIAGNOSIS — F410 Panic disorder [episodic paroxysmal anxiety] without agoraphobia: Secondary | ICD-10-CM | POA: Diagnosis present

## 2014-07-12 DIAGNOSIS — L02414 Cutaneous abscess of left upper limb: Secondary | ICD-10-CM

## 2014-07-12 DIAGNOSIS — M7989 Other specified soft tissue disorders: Secondary | ICD-10-CM | POA: Diagnosis present

## 2014-07-12 DIAGNOSIS — F17210 Nicotine dependence, cigarettes, uncomplicated: Secondary | ICD-10-CM | POA: Diagnosis present

## 2014-07-12 DIAGNOSIS — O99320 Drug use complicating pregnancy, unspecified trimester: Secondary | ICD-10-CM | POA: Insufficient documentation

## 2014-07-12 DIAGNOSIS — A599 Trichomoniasis, unspecified: Secondary | ICD-10-CM | POA: Diagnosis present

## 2014-07-12 DIAGNOSIS — G8929 Other chronic pain: Secondary | ICD-10-CM | POA: Diagnosis present

## 2014-07-12 DIAGNOSIS — F1410 Cocaine abuse, uncomplicated: Secondary | ICD-10-CM | POA: Diagnosis present

## 2014-07-12 DIAGNOSIS — Z825 Family history of asthma and other chronic lower respiratory diseases: Secondary | ICD-10-CM | POA: Diagnosis not present

## 2014-07-12 DIAGNOSIS — Z86711 Personal history of pulmonary embolism: Secondary | ICD-10-CM | POA: Diagnosis not present

## 2014-07-12 DIAGNOSIS — R079 Chest pain, unspecified: Secondary | ICD-10-CM

## 2014-07-12 DIAGNOSIS — L0390 Cellulitis, unspecified: Secondary | ICD-10-CM | POA: Diagnosis not present

## 2014-07-12 DIAGNOSIS — F319 Bipolar disorder, unspecified: Secondary | ICD-10-CM | POA: Diagnosis present

## 2014-07-12 DIAGNOSIS — L03114 Cellulitis of left upper limb: Secondary | ICD-10-CM | POA: Diagnosis present

## 2014-07-12 DIAGNOSIS — J449 Chronic obstructive pulmonary disease, unspecified: Secondary | ICD-10-CM | POA: Diagnosis present

## 2014-07-12 LAB — COMPREHENSIVE METABOLIC PANEL
ALT: 95 U/L — ABNORMAL HIGH (ref 0–35)
AST: 57 U/L — ABNORMAL HIGH (ref 0–37)
Albumin: 3.2 g/dL — ABNORMAL LOW (ref 3.5–5.2)
Alkaline Phosphatase: 64 U/L (ref 39–117)
Anion gap: 6 (ref 5–15)
BUN: 8 mg/dL (ref 6–23)
CO2: 28 mmol/L (ref 19–32)
Calcium: 8.4 mg/dL (ref 8.4–10.5)
Chloride: 101 mmol/L (ref 96–112)
Creatinine, Ser: 0.9 mg/dL (ref 0.50–1.10)
GFR calc Af Amer: >90 mL/min (ref >90)
GFR calc non Af Amer: 78 mL/min — ABNORMAL LOW (ref >90)
Glucose, Bld: 107 mg/dL — ABNORMAL HIGH (ref 70–99)
Potassium: 3.4 mmol/L — ABNORMAL LOW (ref 3.5–5.1)
Sodium: 135 mmol/L (ref 135–145)
Total Bilirubin: 0.4 mg/dL (ref 0.3–1.2)
Total Protein: 7.1 g/dL (ref 6.0–8.3)

## 2014-07-12 LAB — URINE MICROSCOPIC-ADD ON

## 2014-07-12 LAB — RAPID URINE DRUG SCREEN, HOSP PERFORMED
Amphetamines: NOT DETECTED
Barbiturates: NOT DETECTED
Benzodiazepines: NOT DETECTED
Cocaine: POSITIVE — AB
Opiates: NOT DETECTED
Tetrahydrocannabinol: NOT DETECTED

## 2014-07-12 LAB — URINALYSIS, ROUTINE W REFLEX MICROSCOPIC
Bilirubin Urine: NEGATIVE
Glucose, UA: NEGATIVE mg/dL
Hgb urine dipstick: NEGATIVE
Ketones, ur: NEGATIVE mg/dL
Nitrite: NEGATIVE
Protein, ur: NEGATIVE mg/dL
Specific Gravity, Urine: 1.02 (ref 1.005–1.030)
Urobilinogen, UA: 0.2 mg/dL (ref 0.0–1.0)
pH: 7 (ref 5.0–8.0)

## 2014-07-12 LAB — PREGNANCY, URINE: Preg Test, Ur: NEGATIVE

## 2014-07-12 LAB — TROPONIN I
Troponin I: <0.03 ng/mL (ref <0.031)
Troponin I: <0.03 ng/mL (ref <0.031)
Troponin I: <0.03 ng/mL (ref <0.031)

## 2014-07-12 MED ORDER — PIPERACILLIN-TAZOBACTAM 3.375 G IVPB
3.3750 g | Freq: Three times a day (TID) | INTRAVENOUS | Status: DC
  Administered 2014-07-12 – 2014-07-16 (×13): 3.375 g via INTRAVENOUS
  Filled 2014-07-12 (×16): qty 50

## 2014-07-12 MED ORDER — HEPARIN SODIUM (PORCINE) 5000 UNIT/ML IJ SOLN
5000.0000 [IU] | Freq: Three times a day (TID) | INTRAMUSCULAR | Status: DC
  Administered 2014-07-12 (×2): 5000 [IU] via SUBCUTANEOUS
  Filled 2014-07-12 (×2): qty 1

## 2014-07-12 MED ORDER — DOCUSATE SODIUM 100 MG PO CAPS
100.0000 mg | ORAL_CAPSULE | Freq: Two times a day (BID) | ORAL | Status: DC
  Administered 2014-07-12 – 2014-07-16 (×9): 100 mg via ORAL
  Filled 2014-07-12 (×9): qty 1

## 2014-07-12 MED ORDER — TRAZODONE HCL 50 MG PO TABS
50.0000 mg | ORAL_TABLET | Freq: Every evening | ORAL | Status: DC | PRN
  Administered 2014-07-12 – 2014-07-15 (×4): 50 mg via ORAL
  Filled 2014-07-12 (×4): qty 1

## 2014-07-12 MED ORDER — SODIUM CHLORIDE 0.9 % IV BOLUS (SEPSIS)
1000.0000 mL | Freq: Once | INTRAVENOUS | Status: AC
  Administered 2014-07-12: 1000 mL via INTRAVENOUS

## 2014-07-12 MED ORDER — METRONIDAZOLE 500 MG PO TABS
2000.0000 mg | ORAL_TABLET | Freq: Once | ORAL | Status: AC
  Administered 2014-07-12: 2000 mg via ORAL
  Filled 2014-07-12: qty 4

## 2014-07-12 MED ORDER — HYDROMORPHONE HCL 1 MG/ML IJ SOLN
1.0000 mg | INTRAMUSCULAR | Status: DC | PRN
  Administered 2014-07-12 – 2014-07-15 (×22): 1 mg via INTRAVENOUS
  Filled 2014-07-12 (×23): qty 1

## 2014-07-12 MED ORDER — ONDANSETRON HCL 4 MG PO TABS: 4.0000 mg | ORAL_TABLET | Freq: Four times a day (QID) | ORAL | Status: DC | PRN

## 2014-07-12 MED ORDER — VANCOMYCIN HCL IN DEXTROSE 1-5 GM/200ML-% IV SOLN
1000.0000 mg | Freq: Once | INTRAVENOUS | Status: AC
  Administered 2014-07-12: 1000 mg via INTRAVENOUS
  Filled 2014-07-12: qty 200

## 2014-07-12 MED ORDER — GADOBENATE DIMEGLUMINE 529 MG/ML IV SOLN
17.0000 mL | Freq: Once | INTRAVENOUS | Status: AC | PRN
  Administered 2014-07-12: 17 mL via INTRAVENOUS

## 2014-07-12 MED ORDER — VANCOMYCIN HCL IN DEXTROSE 1-5 GM/200ML-% IV SOLN
1000.0000 mg | Freq: Two times a day (BID) | INTRAVENOUS | Status: DC
  Administered 2014-07-12 – 2014-07-14 (×5): 1000 mg via INTRAVENOUS
  Filled 2014-07-12 (×7): qty 200

## 2014-07-12 MED ORDER — PIPERACILLIN-TAZOBACTAM 3.375 G IVPB 30 MIN
3.3750 g | Freq: Once | INTRAVENOUS | Status: AC
  Administered 2014-07-12: 3.375 g via INTRAVENOUS
  Filled 2014-07-12: qty 50

## 2014-07-12 MED ORDER — ONDANSETRON HCL 4 MG/2ML IJ SOLN: 4.0000 mg | Freq: Four times a day (QID) | INTRAMUSCULAR | Status: DC | PRN

## 2014-07-12 MED ORDER — HYDROXYZINE HCL 25 MG PO TABS
25.0000 mg | ORAL_TABLET | Freq: Four times a day (QID) | ORAL | Status: DC | PRN
  Administered 2014-07-13 – 2014-07-15 (×4): 25 mg via ORAL
  Filled 2014-07-12 (×5): qty 1

## 2014-07-12 NOTE — Progress Notes (Signed)
INITIAL NUTRITION ASSESSMENT  DOCUMENTATION CODES Per approved criteria  -Severe  malnutrition in the context of social or environmental circumstances   Pt meets criteria for severe MALNUTRITION in the context of social environmental circumstance as evidenced by wt loss >10% in 6 months and </= 75% energy intake for >/= 1 month.  INTERVENTION: Regular diet and offer high protein snacks between meals ad lib  NUTRITION DIAGNOSIS: Malnutrition related to food avoidance associated with drug abuse   as evidenced by presence of cocaine in urine drug screen, wt loss 14% x 180 days and pt reported diet hx   Goal: Pt to meet >/= 90% of their estimated nutrition needs    Monitor: Po intake, labs and wt trends   Reason for Assessment: Malnutrition Screen   44 y.o. female  Admitting Dx: Cellulitis and abscess  ASSESSMENT: Pt has hx of polysubstance abuse. Her urine drug screen was positive for cocaine. Pt breakfast tray is here and everything has been consumed except the oatmeal (which she doesn't like). Her appetite is very good and pt says, "I've lost a lot of weight because I've been stayin' high and not eating."   Nutrition focused exam: no wasting observed  Height: Ht Readings from Last 1 Encounters:  07/11/14 5\' 1"  (1.549 m)    Weight: Wt Readings from Last 1 Encounters:  07/11/14 180 lb (81.647 kg)    Ideal Body Weight: 105# (47.7 kg)  % Ideal Body Weight: 171%  Wt Readings from Last 10 Encounters:  07/11/14 180 lb (81.647 kg)  06/16/14 189 lb (85.73 kg)  06/15/14 182 lb 1.6 oz (82.6 kg)  12/18/13 210 lb (95.255 kg)  12/01/13 212 lb (96.163 kg)  11/19/13 240 lb (108.863 kg)  02/21/13 240 lb (108.863 kg)  01/22/13 240 lb (108.863 kg)  12/14/12 240 lb (108.863 kg)  09/29/12 256 lb (116.121 kg)    Usual Body Weight: 210-212#  % Usual Body Weight: 86%  BMI:  Body mass index is 34.03 kg/(m^2). obesity class I  Estimated Nutritional Needs: Kcal:  1610-9604 Protein: 72-85 gr Fluid: >1500 ml daily  Skin: cellulitis (right and left arm), excoriation to arm and leg  Diet Order: Diet regular Room service appropriate?: Yes; Fluid consistency:: Thin  EDUCATION NEEDS: -Education not appropriate at this time   Intake/Output Summary (Last 24 hours) at 07/12/14 0851 Last data filed at 07/12/14 0758  Gross per 24 hour  Intake   1780 ml  Output    200 ml  Net   1580 ml    Last BM: 07/11/14  Labs:   Recent Labs Lab 07/11/14 2330  NA 135  K 3.4*  CL 101  CO2 28  BUN 8  CREATININE 0.90  CALCIUM 8.4  GLUCOSE 107*    CBG (last 3)  No results for input(s): GLUCAP in the last 72 hours.  Scheduled Meds: . docusate sodium  100 mg Oral BID  . heparin  5,000 Units Subcutaneous 3 times per day  . piperacillin-tazobactam (ZOSYN)  IV  3.375 g Intravenous Q8H  . traZODone  50 mg Oral QHS,MR X 1  . vancomycin  1,000 mg Intravenous Q12H    Continuous Infusions: . sodium chloride 100 mL/hr at 07/12/14 0541    Past Medical History  Diagnosis Date  . COPD (chronic obstructive pulmonary disease)   . Anxiety   . Panic attacks   . Pulmonary embolism   . Chronic back pain   . DDD (degenerative disc disease)   . Chronic knee  pain   . Polysubstance abuse     cocaine, opiates, marijuana, amphetamines, "molly," crystal meth    Past Surgical History  Procedure Laterality Date  . Cesarean section    . Nose surgery    . Tubes tied      Colman Cater MS,RD,CSG,LDN Office: [phone] Pager: [phone]

## 2014-07-12 NOTE — Clinical Social Work Note (Signed)
CSW provided patient with that updated list of SA providers (both in patient and outpatient). CSW advised patient to contact the inpatient providers as the facilities typically were required to speak directly with the patient.  CSW discussed with patient that she would need to notify her nurse if she was accepted and made the decision to go to one of these facilities over the weekend.  Patient stated that she would contact the programs listed on the sheet.   CSW contacted [facility] inpatient women's program and left a message for the admission coordinator.  Ambrose Pancoast, [place]

## 2014-07-12 NOTE — Progress Notes (Signed)
Patient was out of room/MRI. Plan to visit later if possible.

## 2014-07-12 NOTE — Progress Notes (Signed)
  Echocardiogram 2D Echocardiogram has been performed.  Samuel Germany 07/12/2014, 11:54 AM

## 2014-07-12 NOTE — Care Management Utilization Note (Signed)
UR completed 

## 2014-07-12 NOTE — Progress Notes (Signed)
ANTIBIOTIC CONSULT NOTE - FOLLOW UP  Pharmacy Consult for Vancomycin & Zosyn Indication: cellulitis  Allergies  Allergen Reactions  . Doxycycline Other (See Comments)    burns  . Pyridium [Phenazopyridine Hcl] Other (See Comments)    Irritation to skin  . Tramadol Nausea And Vomiting    Patient Measurements: Height: 5\' 1"  (154.9 cm) Weight: 180 lb (81.647 kg) IBW/kg (Calculated) : 47.8  Vital Signs: Temp: 98.6 F (37 C) (04/01 0433) Temp Source: Oral (04/01 0433) BP: 115/69 mmHg (04/01 0433) Pulse Rate: 60 (04/01 0433) Intake/Output from previous day: 03/31 0701 - 04/01 0700 In: 1780 [I.V.:513.3; IV Piggyback:1266.7] Out: -  Intake/Output from this shift:    Labs:  Recent Labs  07/11/14 2330  WBC 12.0*  HGB 11.2*  PLT 228  CREATININE 0.90   Estimated Creatinine Clearance: 78.8 mL/min (by C-G formula based on Cr of 0.9). No results for input(s): VANCOTROUGH, VANCOPEAK, VANCORANDOM, GENTTROUGH, GENTPEAK, GENTRANDOM, TOBRATROUGH, TOBRAPEAK, TOBRARND, AMIKACINPEAK, AMIKACINTROU, AMIKACIN in the last 72 hours.   Microbiology: Recent Results (from the past 720 hour(s))  Urine culture     Status: None   Collection Time: 06/17/14  7:00 AM  Result Value Ref Range Status   Specimen Description   Final    URINE, CLEAN CATCH Performed at [place] Requests   Final    Normal Performed at [place]   Final    35,000 COLONIES/ML Performed at [facility]    Culture   Final    Multiple bacterial morphotypes present, none predominant. Suggest appropriate recollection if clinically indicated. Performed at [facility]    Report Status 06/19/2014 FINAL  Final  Culture, blood (routine x 2)     Status: None (Preliminary result)   Collection Time: 07/11/14 11:30 PM  Result Value Ref Range Status   Specimen Description BLOOD RIGHT FOREARM  Final   Special Requests   Final    BOTTLES  DRAWN AEROBIC AND ANAEROBIC AEB 8CC ANA 5CC   Culture PENDING  Incomplete   Report Status PENDING  Incomplete  Culture, blood (routine x 2)     Status: None (Preliminary result)   Collection Time: 07/11/14 11:45 PM  Result Value Ref Range Status   Specimen Description BLOOD RIGHT ANTECUBITAL  Final   Special Requests BOTTLES DRAWN AEROBIC AND ANAEROBIC [facility] EACH  Final   Culture PENDING  Incomplete   Report Status PENDING  Incomplete  Culture, blood (single)     Status: None (Preliminary result)   Collection Time: 07/11/14 11:52 PM  Result Value Ref Range Status   Specimen Description BLOOD RIGHT HAND  Final   Special Requests   Final    BOTTLES DRAWN AEROBIC AND ANAEROBIC AEB 6CC ANA [place]   Culture PENDING  Incomplete   Report Status PENDING  Incomplete    Anti-infectives    Start     Dose/Rate Route Frequency Ordered Stop   07/12/14 0130  vancomycin (VANCOCIN) IVPB 1000 mg/200 mL premix     1,000 mg 200 mL/hr over 60 Minutes Intravenous  Once 07/12/14 0121 07/12/14 0310   07/12/14 0130  piperacillin-tazobactam (ZOSYN) IVPB 3.375 g     3.375 g 100 mL/hr over 30 Minutes Intravenous  Once 07/12/14 0121 07/12/14 4034      Assessment: 44 yo F admitted with cellulitis/ swelling of right forehand.  She is known IV drug abuser.   She had fever on admission (Tm  101.38F) and mild leukocytosis.  Cx data pending.   Renal function is at patient's baseline.   Vancomycin 4/1>> Zosyn 4/1>>  Goal of Therapy:  Vancomycin trough level 10-15 mcg/ml  Plan:  Zosyn 3.375gm IV Q8h to be infused over 4hrs Vancomycin 1gm IV q12h Check Vancomycin trough at steady state Monitor renal function and cx data   Biagio Borg 07/12/2014,7:47 AM

## 2014-07-12 NOTE — Consult Note (Signed)
Reason for Consult:cellulits of left wrist area and possible abscess Referring Physician: Hospitalist  [name] is an 44 y.o. female.  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  She developed swelling and redness of the left wrist area over the last few days.  She was admitted early this morning.  MRI shows purulence of area over left distal radius metaphysis area.  She has no numbness or fever or chills now.  She has had prior areas of abscess with drainage of both forearms.    After examining patient I aspirated about 4 to 5 cc of purulent material from the left distal lateral radius area.  Material to be sent for culture and gram stain.  She tolerated it well.  It was done by sterile technique.  Past Medical History  Diagnosis Date  . COPD (chronic obstructive pulmonary disease)   . Anxiety   . Panic attacks   . Pulmonary embolism   . Chronic back pain   . DDD (degenerative disc disease)   . Chronic knee pain   . Polysubstance abuse     cocaine, opiates, marijuana, amphetamines, "molly," crystal meth    Past Surgical History  Procedure Laterality Date  . Cesarean section    . Nose surgery    . Tubes tied      Family History  Problem Relation Age of Onset  . Arthritis    . Lung disease    . Cancer    . Asthma      Social History:  reports that she has been smoking Cigarettes.  She has a 12.5 pack-year smoking history. She has never used smokeless tobacco. She reports that she uses illicit drugs (IV, Cocaine, Marijuana, and Methamphetamines). She reports that she does not drink alcohol.  Allergies:  Allergies  Allergen Reactions  . Doxycycline Other (See Comments)    burns  . Pyridium [Phenazopyridine Hcl] Other (See Comments)    Irritation to skin  . Tramadol Nausea And Vomiting    Medications: I have reviewed the patient's current medications.  Results for orders placed or performed during the hospital encounter of 07/11/14  (from the past 48 hour(s))  Culture, blood (routine x 2)     Status: None (Preliminary result)   Collection Time: 07/11/14 11:30 PM  Result Value Ref Range   Specimen Description BLOOD RIGHT FOREARM    Special Requests      BOTTLES DRAWN AEROBIC AND ANAEROBIC AEB 8CC ANA 5CC   Culture PENDING    Report Status PENDING   Comprehensive metabolic panel     Status: Abnormal   Collection Time: 07/11/14 11:30 PM  Result Value Ref Range   Sodium 135 135 - 145 mmol/L   Potassium 3.4 (L) 3.5 - 5.1 mmol/L   Chloride 101 96 - 112 mmol/L   CO2 28 19 - 32 mmol/L   Glucose, Bld 107 (H) 70 - 99 mg/dL   BUN 8 6 - 23 mg/dL   Creatinine, Ser 0.90 0.50 - 1.10 mg/dL   Calcium 8.4 8.4 - 10.5 mg/dL   Total Protein 7.1 6.0 - 8.3 g/dL   Albumin 3.2 (L) 3.5 - 5.2 g/dL   AST 57 (H) 0 - 37 U/L   ALT 95 (H) 0 - 35 U/L   Alkaline Phosphatase 64 39 - 117 U/L   Total Bilirubin 0.4 0.3 - 1.2 mg/dL   GFR calc non Af Amer 78 (L) >90 mL/min   GFR calc Af Amer >  90 >90 mL/min    Comment: (NOTE) The eGFR has been calculated using the CKD EPI equation. This calculation has not been validated in all clinical situations. eGFR's persistently <90 mL/min signify possible Chronic Kidney Disease.    Anion gap 6 5 - 15  CBC with Differential     Status: Abnormal   Collection Time: 07/11/14 11:30 PM  Result Value Ref Range   WBC 12.0 (H) 4.0 - 10.5 K/uL   RBC 3.81 (L) 3.87 - 5.11 MIL/uL   Hemoglobin 11.2 (L) 12.0 - 15.0 g/dL   HCT 33.2 (L) 36.0 - 46.0 %   MCV 87.1 78.0 - 100.0 fL   MCH 29.4 26.0 - 34.0 pg   MCHC 33.7 30.0 - 36.0 g/dL   RDW 14.5 11.5 - 15.5 %   Platelets 228 150 - 400 K/uL   Neutrophils Relative % 67 43 - 77 %   Neutro Abs 8.0 (H) 1.7 - 7.7 K/uL   Lymphocytes Relative 25 12 - 46 %   Lymphs Abs 3.0 0.7 - 4.0 K/uL   Monocytes Relative 7 3 - 12 %   Monocytes Absolute 0.8 0.1 - 1.0 K/uL   Eosinophils Relative 1 0 - 5 %   Eosinophils Absolute 0.2 0.0 - 0.7 K/uL   Basophils Relative 0 0 - 1 %    Basophils Absolute 0.0 0.0 - 0.1 K/uL  Troponin I     Status: None   Collection Time: 07/11/14 11:30 PM  Result Value Ref Range   Troponin I <0.03 <0.031 ng/mL    Comment:        NO INDICATION OF MYOCARDIAL INJURY.   Culture, blood (routine x 2)     Status: None (Preliminary result)   Collection Time: 07/11/14 11:45 PM  Result Value Ref Range   Specimen Description BLOOD RIGHT ANTECUBITAL    Special Requests BOTTLES DRAWN AEROBIC AND ANAEROBIC 6CC EACH    Culture PENDING    Report Status PENDING   Culture, blood (single)     Status: None (Preliminary result)   Collection Time: 07/11/14 11:52 PM  Result Value Ref Range   Specimen Description BLOOD RIGHT HAND    Special Requests      BOTTLES DRAWN AEROBIC AND ANAEROBIC AEB 6CC ANA [place]   Culture PENDING    Report Status PENDING   Urine rapid drug screen (hosp performed)     Status: Abnormal   Collection Time: 07/11/14 11:55 PM  Result Value Ref Range   Opiates NONE DETECTED NONE DETECTED   Cocaine POSITIVE (A) NONE DETECTED   Benzodiazepines NONE DETECTED NONE DETECTED   Amphetamines NONE DETECTED NONE DETECTED   Tetrahydrocannabinol NONE DETECTED NONE DETECTED   Barbiturates NONE DETECTED NONE DETECTED    Comment:        DRUG SCREEN FOR MEDICAL PURPOSES ONLY.  IF CONFIRMATION IS NEEDED FOR ANY PURPOSE, NOTIFY LAB WITHIN 5 DAYS.        LOWEST DETECTABLE LIMITS FOR URINE DRUG SCREEN Drug Class       Cutoff (ng/mL) Amphetamine      1000 Barbiturate      200 Benzodiazepine   740 Tricyclics       814 Opiates          300 Cocaine          300 THC              50   Urinalysis, Routine w reflex microscopic     Status: Abnormal   Collection  Time: 07/11/14 11:55 PM  Result Value Ref Range   Color, Urine YELLOW YELLOW   APPearance CLEAR CLEAR   Specific Gravity, Urine 1.020 1.005 - 1.030   pH 7.0 5.0 - 8.0   Glucose, UA NEGATIVE NEGATIVE mg/dL   Hgb urine dipstick NEGATIVE NEGATIVE   Bilirubin Urine NEGATIVE NEGATIVE    Ketones, ur NEGATIVE NEGATIVE mg/dL   Protein, ur NEGATIVE NEGATIVE mg/dL   Urobilinogen, UA 0.2 0.0 - 1.0 mg/dL   Nitrite NEGATIVE NEGATIVE   Leukocytes, UA TRACE (A) NEGATIVE  Urine microscopic-add on     Status: Abnormal   Collection Time: 07/11/14 11:55 PM  Result Value Ref Range   Squamous Epithelial / LPF FEW (A) RARE   WBC, UA 3-6 <3 WBC/hpf   RBC / HPF 0-2 <3 RBC/hpf   Bacteria, UA FEW (A) RARE   Urine-Other TRICHOMONAS PRESENT   Troponin I (q 6hr x 3)     Status: None   Collection Time: 07/12/14  3:34 AM  Result Value Ref Range   Troponin I <0.03 <0.031 ng/mL    Comment:        NO INDICATION OF MYOCARDIAL INJURY.   Pregnancy, urine     Status: None   Collection Time: 07/12/14  7:50 AM  Result Value Ref Range   Preg Test, Ur NEGATIVE NEGATIVE  Troponin I (q 6hr x 3)     Status: None   Collection Time: 07/12/14  9:02 AM  Result Value Ref Range   Troponin I <0.03 <0.031 ng/mL    Comment:        NO INDICATION OF MYOCARDIAL INJURY.     Dg Chest 2 View  07/12/2014   CLINICAL DATA:  Chest pain, shortness of breath, IV drug user.  EXAM: CHEST  2 VIEW  COMPARISON:  Most recent chest radiograph 02/09/2013  FINDINGS: The lungs are hyperinflated with coarsened interstitial markings. Question of small focal opacity at the left costophrenic angle. There is otherwise no confluent airspace disease. The heart is at the upper limits of normal in size. There is no pleural effusion or pneumothorax. No acute osseous abnormalities.  IMPRESSION: 1. Hyperinflation with coarse interstitial markings, may reflect emphysema. 2. Questionable small focal opacity at the left costophrenic angle, may reflect atelectasis versus pneumonitis.   Electronically Signed   By: Jeb Levering M.D.   On: 07/12/2014 01:01   Mr Forearm Left Wo/w Cm  07/12/2014   CLINICAL DATA:  Painful swelling to the entire left arm with redness. Limited mobility. Recent IV drug use.  EXAM: MRI OF THE LEFT FOREARM WITHOUT AND  WITH CONTRAST  TECHNIQUE: Multiplanar, multisequence MR imaging was performed both before and after administration of intravenous contrast.  CONTRAST:  35m MULTIHANCE GADOBENATE DIMEGLUMINE 529 MG/ML IV SOLN  COMPARISON:  None.  FINDINGS: There is no marrow signal abnormality. There is no fracture or dislocation. There is no periosteal reaction. There is no bone destruction.  The flexor and extensor compartment muscles and tendons are normal in signal. There is severe edema in the subcutaneous fat circumferentially around the right forearm and dorsal aspect of the hand with enhancement on postcontrast imaging most consistent with cellulitis. There is a 2 x 1.5 x 2.1 cm multiloculated peripherally enhancing fluid collection in the distal lateral forearm at the level of the radial metaphysis most concerning for an abscess.  IMPRESSION: 1. Severe edema in the subcutaneous fat circumferentially around the right forearm and dorsal aspect of the hand with enhancement on postcontrast imaging  most consistent with cellulitis. There is a 2 x 1.5 x 2.1 cm multiloculated peripherally enhancing fluid collection in the distal lateral forearm at the level of the radial metaphysis most concerning for an abscess.   Electronically Signed   By: Kathreen Devoid   On: 07/12/2014 12:56    Review of Systems  Musculoskeletal:       Pain left wrist area, laterally and volarly.  Also area of pain and swelling of right mid volar forearm.  Psychiatric/Behavioral:       History of IV drug abuse abuse and Narcotic abuse and dependence.   Blood pressure 115/54, pulse 64, temperature 98.6 F (37 C), temperature source Oral, resp. rate 18, height 5' 1"  (1.549 m), weight 81.647 kg (180 lb), last menstrual period 06/23/2014, SpO2 97 %. Physical Exam  Constitutional: She is oriented to person, place, and time. She appears well-developed and well-nourished.  HENT:  Head: Normocephalic and atraumatic.  Eyes: Conjunctivae and EOM are normal.  Pupils are equal, round, and reactive to light.  Neck: Normal range of motion. Neck supple.  Cardiovascular: Normal rate and intact distal pulses.   Respiratory: Effort normal.  GI: Soft.  Musculoskeletal: She exhibits tenderness (Pain left wrist area, lateral at metaphysis of radius and volar wrist area wtih redness, decreased motion secondary to pain, prior skin scars from prior lacerations of forearm.).       Left forearm: She exhibits tenderness and swelling.       Arms: Neurological: She is alert and oriented to person, place, and time. She has normal reflexes.  Skin: There is erythema (left lateral distal radial metaphysis area with fluctuance and redness and swelling and pain.  Area of volar distal radius with swelling and redness and some slight drainage from pea sized opening.  Right forearm with  mid volar area redness. ).    Assessment/Plan: Cellulitis of the left forearm, wrist area.  Area of fluctuance and abscess drained at the lateral distal left wrist.  Awaiting culture and gram stain results.  Cellulitis of the right mid volar forearm.    History of IV drug abuse.  Continue present antibiotics for now.  Will observe.  Kimika Streater 07/12/2014, 3:24 PM

## 2014-07-12 NOTE — H&P (Signed)
[facility] History and Physical  [name] LXB:[id number] DOB: 1970/06/08    PCP:   [name] Patient   Chief Complaint: swelling of the right forehand.  HPI: [name] is an 43 y.o. female with hx of polysubstance abuse ( Met, heroine, cocaine, narcotics, THC, "Molly", amphetamine), hx of PE, anxiety, COPD, presented to the ER with swelling of her hand, and a small pustule.  She said she was trying to "stab" it with a needle this am. Work up included a WBC of 12K, Hb of 11 grams per dL and normal renal fx tests.  She said she has not been abusing drug any longer.  Her  UDS showed the presence of Cocaine.  She had some chest pain, but no SOB and no pleuritic pain.  Hospitalist was asked to admit her after blood culture was obtained.    Rewiew of Systems:  Constitutional: Negative for malaise, fever and chills. No significant weight loss or weight gain Eyes: Negative for eye pain, redness and discharge, diplopia, visual changes, or flashes of light. ENMT: Negative for ear pain, hoarseness, nasal congestion, sinus pressure and sore throat. No headaches; tinnitus, drooling, or problem swallowing. Cardiovascular: Negative for chest pain, palpitations, diaphoresis, dyspnea and peripheral edema. ; No orthopnea, PND Respiratory: Negative for cough, hemoptysis, wheezing and stridor. No pleuritic chestpain. Gastrointestinal: Negative for nausea, vomiting, diarrhea, constipation, abdominal pain, melena, blood in stool, hematemesis, jaundice and rectal bleeding.    Genitourinary: Negative for frequency, dysuria, incontinence,flank pain and hematuria; Musculoskeletal: Negative for back pain and neck pain. Negative for swelling and trauma.;  Skin: . Negative for pruritus, rash, abrasions, bruising and skin lesion.; ulcerations Neuro: Negative for headache, lightheadedness and neck stiffness. Negative for weakness, altered level of consciousness , altered mental status, extremity weakness,  burning feet, involuntary movement, seizure and syncope.  Psych: negative for anxiety, depression, insomnia, tearfulness, panic attacks, hallucinations, paranoia, suicidal or homicidal ideation    Past Medical History  Diagnosis Date  . COPD (chronic obstructive pulmonary disease)   . Anxiety   . Panic attacks   . Pulmonary embolism   . Chronic back pain   . DDD (degenerative disc disease)   . Chronic knee pain   . Polysubstance abuse     cocaine, opiates, marijuana, amphetamines, "molly," crystal meth    Past Surgical History  Procedure Laterality Date  . Cesarean section    . Nose surgery    . Tubes tied      Medications:  HOME MEDS: Prior to Admission medications   Medication Sig Start Date End Date Taking? Authorizing Provider  hydrOXYzine (ATARAX/VISTARIL) 25 MG tablet Take 1 tablet (25 mg total) by mouth every 6 (six) hours as needed for anxiety. 06/18/14   Encarnacion Slates, NP  traZODone (DESYREL) 50 MG tablet Take 1 tablet (50 mg total) by mouth at bedtime and may repeat dose one time if needed. For sleep 06/18/14   Encarnacion Slates, NP     Allergies:  Allergies  Allergen Reactions  . Doxycycline Other (See Comments)    burns  . Pyridium [Phenazopyridine Hcl] Other (See Comments)    Irritation to skin  . Tramadol Nausea And Vomiting    Social History:   reports that she has been smoking Cigarettes.  She has a 12.5 pack-year smoking history. She has never used smokeless tobacco. She reports that she uses illicit drugs (IV, Cocaine, Marijuana, and Methamphetamines). She reports that she does not drink alcohol.  Family History:  Family History  Problem Relation Age of Onset  . Arthritis    . Lung disease    . Cancer    . Asthma       Physical Exam: Filed Vitals:   07/11/14 2238 07/12/14 0043 07/12/14 0142  BP: 145/78 120/72 114/68  Pulse: 83 75 73  Temp: 101.6 F (38.7 C) 100.2 F (37.9 C) 99.4 F (37.4 C)  TempSrc: Oral Oral Oral  Resp: _0 Height:  _1  (1.549 m)    Weight: 81.647 kg (180 lb)    SpO2: 98% 95% 97%   Blood pressure 114/68, pulse 73, temperature 99.4 F (37.4 C), temperature source Oral, resp. rate 14, height _2  (1.549 m), weight 81.647 kg (180 lb), last menstrual period 06/23/2014, SpO2 97 %.  GEN:  Pleasant  patient lying in the stretcher in no acute distress; cooperative with exam. PSYCH:  alert and oriented x4; does not appear anxious or depressed; affect is appropriate. HEENT: Mucous membranes pink and anicteric; PERRLA; EOM intact; no cervical lymphadenopathy nor thyromegaly or carotid bruit; no JVD; There were no stridor. Neck is very supple. Breasts:: Not examined CHEST WALL: No tenderness CHEST: Normal respiration, clear to auscultation bilaterally.  HEART: Regular rate and rhythm.  There are no murmur, rub, or gallops.   BACK: No kyphosis or scoliosis; no CVA tenderness ABDOMEN: soft and non-tender; no masses, no organomegaly, normal abdominal bowel sounds; no pannus; no intertriginous candida. There is no rebound and no distention. Rectal Exam: Not done EXTREMITIES: No bone or joint deformity; age-appropriate arthropathy of the hands and knees; no edema; no ulcerations.  There is no calf tenderness. Genitalia: not examined PULSES: 2+ and symmetric SKIN: She has swelling of the right forehand, but no fluctuant.  She said when she "poked" at it with a needle, nothing came out.  CNS: Cranial nerves 2-12 grossly intact no focal lateralizing neurologic deficit.  Speech is fluent; uvula elevated with phonation, facial symmetry and tongue midline. DTR are normal bilaterally, cerebella exam is intact, barbinski is negative and strengths are equaled bilaterally.  No sensory loss.   Labs on Admission:  Basic Metabolic Panel:  Recent Labs Lab 07/11/14 2330  NA 135  K 3.4*  CL 101  CO2 28  GLUCOSE 107*  BUN 8  CREATININE 0.90  CALCIUM 8.4   Liver Function Tests:  Recent Labs Lab 07/11/14 2330  AST 57*   ALT 95*  ALKPHOS 64  BILITOT 0.4  PROT 7.1  ALBUMIN 3.2*   No results for input(s): LIPASE, AMYLASE in the last 168 hours. No results for input(s): AMMONIA in the last 168 hours. CBC:  Recent Labs Lab 07/11/14 2330  WBC 12.0*  NEUTROABS 8.0*  HGB 11.2*  HCT 33.2*  MCV 87.1  PLT 228   Cardiac Enzymes:  Recent Labs Lab 07/11/14 2330  TROPONINI <0.03   Radiological Exams on Admission: Dg Chest 2 View  07/12/2014   CLINICAL DATA:  Chest pain, shortness of breath, IV drug user.  EXAM: CHEST  2 VIEW  COMPARISON:  Most recent chest radiograph 02/09/2013  FINDINGS: The lungs are hyperinflated with coarsened interstitial markings. Question of small focal opacity at the left costophrenic angle. There is otherwise no confluent airspace disease. The heart is at the upper limits of normal in size. There is no pleural effusion or pneumothorax. No acute osseous abnormalities.  IMPRESSION: 1. Hyperinflation with coarse interstitial markings, may reflect emphysema. 2. Questionable small focal opacity at the left costophrenic angle,  may reflect atelectasis versus pneumonitis.   Electronically Signed   By: Jeb Levering M.D.   On: 07/12/2014 01:01    EKG: Independently reviewed.    Assessment/Plan Present on Admission:  . Cellulitis and abscess . Opioid dependence . Polysubstance abuse   PLAN:  She has what look like cellulitis rather than any drainable abscess.  WIll wait for blood culture, and in the interim, will give IV Van/Zosyn, along with some IV pain meds.  Will obtain ECHO of her heart.  She is otherwise stable.  I have not consulted surgery, but if she doesn't get better quickly, would  Consider it.   She is otherwise stable, and will be admitted to [facility] service. Thank you for allowing me to participate in her care.   Other plans as per orders.  Code Status: FULL Haskel Khan, MD. [facility] Pager [phone] 7pm to 7am.  07/12/2014, 1:57 AM

## 2014-07-12 NOTE — Progress Notes (Signed)
Patient examined and chart reviewed.   Assessment/plan  1. Cellulitis with possible abscess: related to IV drug use. Fairly extensive erythema/swelling small amount drainage left forearm. Right forearm with golf ball size knot and erythema as well.  Max temp 100.2, WC 12 non-toxic appearing. Blood cultures pending. Will obtain MRI left arm evaluate for abscess. Continue Vanc and zosyn.   2. Polysubstance abuse. Reports last IV drug use 06/13/14. Reports cocaine use 4 days ago. UDS + cocaine. Await HIV antibody. Await echo results.  Social work consult. Of note recently released [facility] for treatment opioid dependence.   3. COPD. Appears stable at this time. Not on home oxygen or inhalers.   Physical exam  Gen: well nourished sitting up in bed. Appears comfortable CV: RRR no MGR No LE edema Resp: normal effort BS clear bilaterally no wheeze Abd: obese soft +BS non-tender to palpation Skin: inner aspect of left forearm with swelling/erythema/heat/tenderness extending to outer aspect, wrist and hand/fingers. Focal swelling inner aspect left arm with nickel size opening small amount drainage. No fluctuance. Right forearm with golf sized swelling midway with mild erythema no tenderness,heat or fluctuance. Track marks inner aspect both arms     Dyanne Carrel , NP

## 2014-07-12 NOTE — Progress Notes (Signed)
ANTIBIOTIC CONSULT NOTE-Preliminary  Pharmacy Consult for vancomycin and Zosyn Indication: Wound infection  Allergies  Allergen Reactions  . Doxycycline Other (See Comments)    burns  . Pyridium [Phenazopyridine Hcl] Other (See Comments)    Irritation to skin  . Tramadol Nausea And Vomiting    Patient Measurements: Height: 5\' 1"  (154.9 cm) Weight: 180 lb (81.647 kg) IBW/kg (Calculated) : 47.8  Vital Signs: Temp: 99.4 F (37.4 C) (04/01 0142) Temp Source: Oral (04/01 0142) BP: 114/68 mmHg (04/01 0142) Pulse Rate: 73 (04/01 0142)  Labs:  Recent Labs  07/11/14 2330  WBC 12.0*  HGB 11.2*  PLT 228  CREATININE 0.90    Estimated Creatinine Clearance: 78.8 mL/min (by C-G formula based on Cr of 0.9).  Microbiology: Blood culture x 3 on 3/31 at 2330, 2345 and 2355 Urine culture on 3/31 at 2355  Medical History: Past Medical History  Diagnosis Date  . COPD (chronic obstructive pulmonary disease)   . Anxiety   . Panic attacks   . Pulmonary embolism   . Chronic back pain   . DDD (degenerative disc disease)   . Chronic knee pain   . Polysubstance abuse     cocaine, opiates, marijuana, amphetamines, "molly," crystal meth    Medications:  Vancomycin 1g IV x 1 Zosyn 3.375g IV x 1  Assessment: Pt is a 44yo F being initiated on vancomycin and Zosyn for wound infection. Doses ordered by MD in the ED. Doses reviewed by pharmacy and are appropriate.   Goal of Therapy:  Vancomycin trough level 15-20 mcg/ml  Plan:  Preliminary review of pertinent patient information completed. [facility] clinical pharmacist will complete review during morning rounds to assess patient and finalize treatment regimen.  Kipp Laurence, Amandalee Lacap [place], [place] 07/12/2014,1:47 AM

## 2014-07-12 NOTE — Care Management Note (Addendum)
    Page 1 of 1   07/16/2014     10:41:48 AM CARE MANAGEMENT NOTE 07/16/2014  Patient:  [name], [name]   Account Number:  [id number]  Date Initiated:  07/12/2014  Documentation initiated by:  CHILDRESS,JESSICA  Subjective/Objective Assessment:   Pt is from home, pt is indepenent. Pt has no PCP. 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 Pt referred to [facility]. Pt planning to go to inpatient substance abuse at [place]. CSW following.     Action/Plan:   If pt going to inpatient rehab, would not be able to make follow up appointment becuase availability will not be known. Will cont to follow.   Anticipated DC Date:  07/15/2014   Anticipated DC Plan:  HOME/SELF CARE  In-house referral  Clinical Social Worker      DC Planning Services  CM consult      Choice offered to / List presented to:             Status of service:  Completed, signed off Medicare Important Message given?  YES (If response is "NO", the following Medicare IM given date fields will be blank) Date Medicare IM given:  07/12/2014 Medicare IM given by:  Jolene Provost Date Additional Medicare IM given:  07/16/2014 Additional Medicare IM given by:  Theophilus Kinds  Discharge Disposition:  HOME/SELF CARE  Per UR Regulation:  Reviewed for med. necessity/level of care/duration of stay  If discussed at [place] of Stay Meetings, dates discussed:   07/16/2014    Comments:  07/16/14 [place], RN BSN Cm Anticipate discharge today. No Cm needs noted. Follow up appt made and documented on AVS. Pts nurse aware of discharge arrangements.  07/12/2014 [place], RN, MSN, CM
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
## 2014-07-13 ENCOUNTER — Encounter (HOSPITAL_COMMUNITY): Payer: Self-pay | Admitting: Family Medicine

## 2014-07-13 DIAGNOSIS — E43 Unspecified severe protein-calorie malnutrition: Secondary | ICD-10-CM

## 2014-07-13 DIAGNOSIS — B1920 Unspecified viral hepatitis C without hepatic coma: Secondary | ICD-10-CM

## 2014-07-13 HISTORY — DX: Unspecified viral hepatitis C without hepatic coma: B19.20

## 2014-07-13 LAB — BASIC METABOLIC PANEL
Anion gap: 6 (ref 5–15)
BUN: 9 mg/dL (ref 6–23)
CO2: 26 mmol/L (ref 19–32)
Calcium: 8.5 mg/dL (ref 8.4–10.5)
Chloride: 104 mmol/L (ref 96–112)
Creatinine, Ser: 0.86 mg/dL (ref 0.50–1.10)
GFR calc Af Amer: >90 mL/min (ref >90)
GFR calc non Af Amer: 82 mL/min — ABNORMAL LOW (ref >90)
Glucose, Bld: 121 mg/dL — ABNORMAL HIGH (ref 70–99)
Potassium: 3.9 mmol/L (ref 3.5–5.1)
Sodium: 136 mmol/L (ref 135–145)

## 2014-07-13 LAB — URINE CULTURE: Colony Count: 5000

## 2014-07-13 LAB — CBC
HCT: 34.8 % — ABNORMAL LOW (ref 36.0–46.0)
Hemoglobin: 11.6 g/dL — ABNORMAL LOW (ref 12.0–15.0)
MCH: 29.4 pg (ref 26.0–34.0)
MCHC: 33.3 g/dL (ref 30.0–36.0)
MCV: 88.1 fL (ref 78.0–100.0)
Platelets: 265 10*3/uL (ref 150–400)
RBC: 3.95 MIL/uL (ref 3.87–5.11)
RDW: 14.6 % (ref 11.5–15.5)
WBC: 9.3 10*3/uL (ref 4.0–10.5)

## 2014-07-13 LAB — HIV ANTIBODY (ROUTINE TESTING W REFLEX): HIV Screen 4th Generation wRfx: NONREACTIVE

## 2014-07-13 MED ORDER — ENOXAPARIN SODIUM 40 MG/0.4ML ~~LOC~~ SOLN
40.0000 mg | SUBCUTANEOUS | Status: DC
  Administered 2014-07-13 – 2014-07-15 (×3): 40 mg via SUBCUTANEOUS
  Filled 2014-07-13 (×3): qty 0.4

## 2014-07-13 MED ORDER — HYDROCODONE-ACETAMINOPHEN 5-325 MG PO TABS
1.0000 | ORAL_TABLET | Freq: Four times a day (QID) | ORAL | Status: DC | PRN
  Administered 2014-07-13 – 2014-07-16 (×4): 1 via ORAL
  Filled 2014-07-13 (×4): qty 1

## 2014-07-13 MED ORDER — ACETAMINOPHEN 325 MG PO TABS
650.0000 mg | ORAL_TABLET | Freq: Four times a day (QID) | ORAL | Status: DC | PRN
  Administered 2014-07-16: 650 mg via ORAL
  Filled 2014-07-13: qty 2

## 2014-07-13 NOTE — Progress Notes (Signed)
Subjective: I feel a little better but my wrist is still sore   Objective: Vital signs in last 24 hours: Temp:  [97.8 F (36.6 C)-98.6 F (37 C)] 97.8 F (36.6 C) (04/02 0537) Pulse Rate:  [57-64] 57 (04/02 0537) Resp:  [18] 18 (04/02 0537) BP: (115-134)/(54-87) 134/87 mmHg (04/02 0537) SpO2:  [97 %-100 %] 97 % (04/02 0537) Weight:  [81.647 kg (180 lb)] 81.647 kg (180 lb) (04/01 1140)  Intake/Output from previous day: 04/01 0701 - 04/02 0700 In: 4160 [P.O.:1560; I.V.:2300; IV Piggyback:300] Out: 4675 [Urine:4675] Intake/Output this shift:     Recent Labs  07/11/14 2330 07/13/14 0655  HGB 11.2* 11.6*    Recent Labs  07/11/14 2330 07/13/14 0655  WBC 12.0* 9.3  RBC 3.81* 3.95  HCT 33.2* 34.8*  PLT 228 265    Recent Labs  07/11/14 2330 07/13/14 0655  NA 135 136  K 3.4* 3.9  CL 101 104  CO2 28 26  BUN 8 9  CREATININE 0.90 0.86  GLUCOSE 107* 121*  CALCIUM 8.4 8.5   No results for input(s): LABPT, INR in the last 72 hours.  Her left wrist still has erythema of the skin and some swelling over the distal lateral radial metaphysis where I aspirated pus yesterday but she has no fluctuanace.  She has some drainage around the wound on the volar side.  ROM of the wrist is still tender.   I will observe this for now.  Her white count has decreased and she is now afebrile.  Gram stain showed gram positive cocci in pairs.  Await cultures.  The joint is not involved at this point.  Assessment/Plan: Continue antibiotics, observe for now.   Gwendlyn Hanback 07/13/2014, 9:29 AM

## 2014-07-13 NOTE — Progress Notes (Signed)
  PROGRESS NOTE  [name] IEP:[id number] DOB: 05/01/1970 DOA: 07/11/2014 PCP: [name] Patient  Summary: 44 year old woman with history of ongoing IV drug use presented with bilateral forearm swelling and pain with associated fever. Initial evaluation was concerning for cellulitis and possible developing abscesses. She was started on IV antibiotics and seen in concert with orthopedics. She underwent aspiration of the abscess left forearm 4/1.  Assessment/Plan: 1. Bilateral forearm cellulitis, abscesses. Improving with antibiotics. Follow-up culture data. Orthopedics appreciated. Status post aspiration left forearm 4/1. 2. Polysubstance abuse, IV drug use. HIV negative. 3. COPD. Stable. 4. Anxiety, schizophrenia, bipolar disorder, chronic back pain per chart 5. Hepatitis C. Outpatient follow-up with GI recommended. 6. Severe malnutrition    Improving, plan continue IV abx, follow-up culture data  Code Status: full code DVT prophylaxis: Lovenox Family Communication: none Disposition Plan: home  Murray Hodgkins, MD  [facility]  Pager [phone] If 7PM-7AM, please contact night-coverage at [email or web address], password [facility] 07/13/2014, 4:04 PM  LOS: 1 day   Consultants:  Orthopedics  Procedures:  4/1 Needle aspiration left forearm abscess  Antibiotics:  Piperacillin tazobactam 4/1 >>  Vancomycin 4/1 >>  HPI/Subjective: Overall somewhat better. Right forearm hurts. Left hand has improved in regard to swelling.  Objective: Filed Vitals:   07/12/14 1421 07/12/14 2053 07/13/14 0537 07/13/14 1518  BP: 115/54 129/79 134/87 108/69  Pulse: 64 58 57 72  Temp: 98.6 F (37 C) 98.6 F (37 C) 97.8 F (36.6 C) 98.1 F (36.7 C)  TempSrc: Oral Axillary Axillary Oral  Resp: 18 18 18 18   Height:      Weight:      SpO2: 97% 100% 97% 97%    Intake/Output Summary (Last 24 hours) at 07/13/14 1604 Last data filed at 07/13/14 1455  Gross per 24 hour  Intake 5551.67 ml    Output   3625 ml  Net 1926.67 ml     Filed Weights   07/11/14 2238  Weight: 81.647 kg (180 lb)    Exam:     Afebrile 24 hours, vital signs stable. No hypoxia. General:  Appears calm and comfortable Cardiovascular: RRR, no m/r/g.  Respiratory: CTA bilaterally, no w/r/r. Normal respiratory effort. Skin: Right forearm volar surface cellulitis improving, softer, less indurated. No drainage noted. Left forearm will surface is bandaged. Significantly less edema left hand. Psychiatric: grossly normal mood and affect, speech fluent and appropriate  New data reviewed:  HIV nonreactive  Pertinent data since admission:    Pending data:  Wound culture  Scheduled Meds: . docusate sodium  100 mg Oral BID  . piperacillin-tazobactam (ZOSYN)  IV  3.375 g Intravenous Q8H  . traZODone  50 mg Oral QHS,MR X 1  . vancomycin  1,000 mg Intravenous Q12H   Continuous Infusions: . sodium chloride 100 mL/hr at 07/12/14 0541    Principal Problem:   Abscess of forearm, left Active Problems:   Polysubstance abuse   Opioid dependence   Cellulitis and abscess   Protein-calorie malnutrition, severe   IV drug abuse   Time spent 15 minutes

## 2014-07-14 LAB — VANCOMYCIN, TROUGH: Vancomycin Tr: 9.4 ug/mL — ABNORMAL LOW (ref 10.0–20.0)

## 2014-07-14 MED ORDER — VANCOMYCIN HCL 10 G IV SOLR
1500.0000 mg | Freq: Two times a day (BID) | INTRAVENOUS | Status: DC
  Administered 2014-07-14 – 2014-07-16 (×4): 1500 mg via INTRAVENOUS
  Filled 2014-07-14 (×6): qty 1500

## 2014-07-14 NOTE — Progress Notes (Signed)
Subjective: I feel better, my left wrist and arm is not hurting today.   Objective: Vital signs in last 24 hours: Temp:  [97.9 F (36.6 C)-98.9 F (37.2 C)] 97.9 F (36.6 C) (04/03 1348) Pulse Rate:  [55-62] 62 (04/03 1348) Resp:  [18] 18 (04/03 1348) BP: (106-143)/(61-78) 107/67 mmHg (04/03 1348) SpO2:  [93 %-98 %] 94 % (04/03 1348)  Intake/Output from previous day: 04/02 0701 - 04/03 0700 In: 2111.7 [P.O.:720; I.V.:891.7; IV Piggyback:500] Out: 1151 [Urine:1150; Stool:1] Intake/Output this shift: Total I/O In: 1440 [P.O.:1440] Out: 1600 [Urine:1600]   Recent Labs  07/11/14 2330 07/13/14 0655  HGB 11.2* 11.6*    Recent Labs  07/11/14 2330 07/13/14 0655  WBC 12.0* 9.3  RBC 3.81* 3.95  HCT 33.2* 34.8*  PLT 228 265    Recent Labs  07/11/14 2330 07/13/14 0655  NA 135 136  K 3.4* 3.9  CL 101 104  CO2 28 26  BUN 8 9  CREATININE 0.90 0.86  GLUCOSE 107* 121*  CALCIUM 8.4 8.5   No results for input(s): LABPT, INR in the last 72 hours.  Her left distal radius and wrist area is much improved with less drainage, less swelling and very little erythema.  She has good motion of the wrist on the left.  She has much less pain.    The area of the right volar mid forearm however is still red and has perhaps a little more swelling.  It is not fluctuant but it is not firm as it once was.  I may need to aspirate the area if it continues to show no signs of improving.  I will check in am.  Assessment/Plan: Cellulitis and abscess of left distal radius not involving the wrist, improving.  Area of cellulitis of right mid volar forearm with continued redness and pain. Continue present course of antibiotics.  Denham Mose 07/14/2014, 6:35 PM

## 2014-07-14 NOTE — Progress Notes (Signed)
  PROGRESS NOTE  [name] OVF:[id number] DOB: 1970-09-16 DOA: 07/11/2014 PCP: [name] Patient  Summary: 44 year old woman with history of ongoing IV drug use presented with bilateral forearm swelling and pain with associated fever. Initial evaluation was concerning for cellulitis and possible developing abscesses. She was started on IV antibiotics and seen in concert with orthopedics. She underwent aspiration of the abscess left forearm 4/1.  Assessment/Plan: 1. Bilateral forearm cellulitis, abscesses. Slowly improving with antibiotics. Wound culture pending. Orthopedics appreciated. Status post aspiration left forearm 4/1. 2. Polysubstance abuse, IV drug use. HIV negative. 3. COPD. Remains stable. 4. Anxiety, schizophrenia, bipolar disorder, chronic back pain per chart. Stable. 5. Hepatitis C. Outpatient follow-up with GI recommended. 6. Severe malnutrition    Slowly improving, plan continue IV abx. May be able to change to oral therapy 1-2 days.  Code Status: full code DVT prophylaxis: Lovenox Family Communication: none Disposition Plan: home  Murray Hodgkins, MD  [facility]  Pager [phone] If 7PM-7AM, please contact night-coverage at [email or web address], password [facility] 07/14/2014, 2:24 PM  LOS: 2 days   Consultants:  Orthopedics  Procedures:  4/1 Needle aspiration left forearm abscess  Antibiotics:  Piperacillin tazobactam 4/1 >>  Vancomycin 4/1 >>  HPI/Subjective: Somewhat better, swelling in left hand has nearly resolved. Right forearm induration softer.  Objective: Filed Vitals:   07/13/14 1518 07/13/14 2145 07/14/14 0606 07/14/14 1348  BP: 108/69 143/78 106/61 107/67  Pulse: 72 55 60 62  Temp: 98.1 F (36.7 C) 98.7 F (37.1 C) 98.9 F (37.2 C) 97.9 F (36.6 C)  TempSrc: Oral Oral Oral Oral  Resp: 18 18 18 18   Height:      Weight:      SpO2: 97% 98% 93% 94%    Intake/Output Summary (Last 24 hours) at 07/14/14 1424 Last data filed at 07/14/14  1300  Gross per 24 hour  Intake 2351.67 ml  Output   1401 ml  Net 950.67 ml     Filed Weights   07/11/14 2238  Weight: 81.647 kg (180 lb)    Exam:     Afebrile 24 hours, VSS, no hypoxia General: Appears calm and comfortable Cardiovascular: RRR, no m/r/g.  . Respiratory: CTA bilaterally, no w/r/r. Normal respiratory effort. Skin: right volar forearm area of induration softer, no fluctuance. Less erythema. Left forearm erythema and edema decreasing. Left hand edema nearly resolved. Psychiatric: grossly normal mood and affect, speech fluent and appropriate  New data reviewed:  none  Pending data:  Wound culture FEW GRAM POSITIVE COCCI  IN PAIRS IN CLUSTERS (obtained after antibiotics started)  Scheduled Meds: . docusate sodium  100 mg Oral BID  . enoxaparin (LOVENOX) injection  40 mg Subcutaneous Q24H  . piperacillin-tazobactam (ZOSYN)  IV  3.375 g Intravenous Q8H  . traZODone  50 mg Oral QHS,MR X 1  . vancomycin  1,500 mg Intravenous Q12H   Continuous Infusions:    Principal Problem:   Abscess of forearm, left Active Problems:   Polysubstance abuse   Opioid dependence   Cellulitis and abscess   Protein-calorie malnutrition, severe   IV drug abuse   Hepatitis C infection   Time spent 15 minutes

## 2014-07-14 NOTE — Progress Notes (Signed)
ANTIBIOTIC CONSULT NOTE  Pharmacy Consult for Vancomycin & Zosyn Indication: cellulitis  Allergies  Allergen Reactions  . Doxycycline Other (See Comments)    burns  . Pyridium [Phenazopyridine Hcl] Other (See Comments)    Irritation to skin  . Tramadol Nausea And Vomiting    Patient Measurements: Height: 5\' 1"  (154.9 cm) Weight: 180 lb (81.647 kg) IBW/kg (Calculated) : 47.8  Vital Signs: Temp: 98.9 F (37.2 C) (04/03 0606) Temp Source: Oral (04/03 0606) BP: 106/61 mmHg (04/03 0606) Pulse Rate: 60 (04/03 0606) Intake/Output from previous day: 04/02 0701 - 04/03 0700 In: 2111.7 [P.O.:720; I.V.:891.7; IV Piggyback:500] Out: 1151 [Urine:1150; Stool:1] Intake/Output from this shift: Total I/O In: 480 [P.O.:480] Out: 600 [Urine:600]  Labs:  Recent Labs  07/11/14 2330 07/13/14 0655  WBC 12.0* 9.3  HGB 11.2* 11.6*  PLT 228 265  CREATININE 0.90 0.86   Estimated Creatinine Clearance: 82.5 mL/min (by C-G formula based on Cr of 0.86).  Recent Labs  07/14/14 1114  VANCOTROUGH 9.4*     Microbiology: Recent Results (from the past 720 hour(s))  Urine culture     Status: None   Collection Time: 06/17/14  7:00 AM  Result Value Ref Range Status   Specimen Description   Final    URINE, CLEAN CATCH Performed at [facility] Requests   Final    Normal Performed at [facility]   Final    35,000 COLONIES/ML Performed at [facility]    Culture   Final    Multiple bacterial morphotypes present, none predominant. Suggest appropriate recollection if clinically indicated. Performed at [facility]    Report Status 06/19/2014 FINAL  Final  Culture, blood (routine x 2)     Status: None (Preliminary result)   Collection Time: 07/11/14 11:30 PM  Result Value Ref Range Status   Specimen Description BLOOD RIGHT FOREARM  Final   Special Requests   Final    BOTTLES DRAWN AEROBIC AND ANAEROBIC  AEB 8CC ANA 5CC   Culture NO GROWTH 3 DAYS  Final   Report Status PENDING  Incomplete  Culture, blood (routine x 2)     Status: None (Preliminary result)   Collection Time: 07/11/14 11:45 PM  Result Value Ref Range Status   Specimen Description BLOOD RIGHT ANTECUBITAL  Final   Special Requests BOTTLES DRAWN AEROBIC AND ANAEROBIC 6CC EACH  Final   Culture NO GROWTH 3 DAYS  Final   Report Status PENDING  Incomplete  Culture, blood (single)     Status: None (Preliminary result)   Collection Time: 07/11/14 11:52 PM  Result Value Ref Range Status   Specimen Description BLOOD RIGHT HAND  Final   Special Requests   Final    BOTTLES DRAWN AEROBIC AND ANAEROBIC AEB 6CC ANA [place]   Culture NO GROWTH 3 DAYS  Final   Report Status PENDING  Incomplete  Urine culture     Status: None   Collection Time: 07/11/14 11:55 PM  Result Value Ref Range Status   Specimen Description URINE, CLEAN CATCH  Final   Special Requests NONE  Final   Colony Count   Final    5,000 COLONIES/ML Performed at [facility]    Culture   Final    INSIGNIFICANT GROWTH Performed at [facility]    Report Status 07/13/2014 FINAL  Final  Culture, routine-abscess     Status: None (Preliminary result)   Collection Time:  07/12/14  3:26 PM  Result Value Ref Range Status   Specimen Description ARM LEFT  Final   Special Requests NONE  Final   Gram Stain   Final    ABUNDANT WBC PRESENT, PREDOMINANTLY PMN NO SQUAMOUS EPITHELIAL CELLS SEEN FEW GRAM POSITIVE COCCI IN PAIRS IN CLUSTERS Performed at [facility]    Culture   Final    Culture reincubated for better growth Performed at [facility]    Report Status PENDING  Incomplete    Anti-infectives    Start     Dose/Rate Route Frequency Ordered Stop   07/14/14 2200  vancomycin (VANCOCIN) 1,500 mg in sodium chloride 0.9 % 500 mL IVPB     1,500 mg 250 mL/hr over 120 Minutes Intravenous Every 12 hours 07/14/14 1306     07/12/14 1330   metroNIDAZOLE (FLAGYL) tablet 2,000 mg     2,000 mg Oral  Once 07/12/14 1326 07/12/14 1349   07/12/14 1200  vancomycin (VANCOCIN) IVPB 1000 mg/200 mL premix  Status:  Discontinued     1,000 mg 200 mL/hr over 60 Minutes Intravenous Every 12 hours 07/12/14 0754 07/14/14 1306   07/12/14 1000  piperacillin-tazobactam (ZOSYN) IVPB 3.375 g     3.375 g 12.5 mL/hr over 240 Minutes Intravenous Every 8 hours 07/12/14 0754     07/12/14 0130  vancomycin (VANCOCIN) IVPB 1000 mg/200 mL premix     1,000 mg 200 mL/hr over 60 Minutes Intravenous  Once 07/12/14 0121 07/12/14 0310   07/12/14 0130  piperacillin-tazobactam (ZOSYN) IVPB 3.375 g     3.375 g 100 mL/hr over 30 Minutes Intravenous  Once 07/12/14 0121 07/12/14 6606      Assessment: 44 yo F admitted with cellulitis/ swelling of right forehand.  She is known IV drug abuser.   She had fever on admission (Tm 101.12F) and mild leukocytosis which has resolved.  Cx data pending.   Renal function is at patient's baseline.  Vancomycin trough is below goal range today.   Vancomycin 4/1>> Zosyn 4/1>>  Goal of Therapy:  Vancomycin trough ~57mcg/ml  Plan:  Zosyn 3.375gm IV Q8h to be infused over 4hrs Increase Vancomycin 1500mg  IV q12h Recheck Vancomycin trough at steady state Monitor renal function and cx data   Biagio Borg 07/14/2014,1:06 PM

## 2014-07-15 DIAGNOSIS — L02413 Cutaneous abscess of right upper limb: Secondary | ICD-10-CM | POA: Insufficient documentation

## 2014-07-15 LAB — BASIC METABOLIC PANEL
Anion gap: 7 (ref 5–15)
BUN: 20 mg/dL (ref 6–23)
CO2: 26 mmol/L (ref 19–32)
Calcium: 8.5 mg/dL (ref 8.4–10.5)
Chloride: 104 mmol/L (ref 96–112)
Creatinine, Ser: 0.97 mg/dL (ref 0.50–1.10)
GFR calc Af Amer: 82 mL/min — ABNORMAL LOW (ref >90)
GFR calc non Af Amer: 71 mL/min — ABNORMAL LOW (ref >90)
Glucose, Bld: 92 mg/dL (ref 70–99)
Potassium: 4.1 mmol/L (ref 3.5–5.1)
Sodium: 137 mmol/L (ref 135–145)

## 2014-07-15 NOTE — Clinical Social Work Note (Signed)
CSW met with patient who was alert and oriented.  Patient indicated that she had not contacted any of the inpatient facilities on the list given by CSW last week. Patient indicated that she had to go home and take care of some things in her family before she could go to an inpatient facility.  She indicated that once she had taken care of the issues within her family she would begin contacting facilities.    CSW finds no further needs.  CSW signing off.   , LCSW [phone] 

## 2014-07-15 NOTE — Progress Notes (Signed)
Subjective: My right arm is tender   Objective: Vital signs in last 24 hours: Temp:  [97.9 F (36.6 C)-98.7 F (37.1 C)] 98.7 F (37.1 C) (04/04 0618) Pulse Rate:  [54-62] 54 (04/04 0618) Resp:  [18] 18 (04/04 0618) BP: (107-125)/(59-67) 109/59 mmHg (04/04 0618) SpO2:  [94 %-98 %] 98 % (04/04 0618)  Intake/Output from previous day: 04/03 0701 - 04/04 0700 In: 1440 [P.O.:1440] Out: 2250 [Urine:2250] Intake/Output this shift:     Recent Labs  07/13/14 0655  HGB 11.6*    Recent Labs  07/13/14 0655  WBC 9.3  RBC 3.95  HCT 34.8*  PLT 265    Recent Labs  07/13/14 0655 07/15/14 0444  NA 136 137  K 3.9 4.1  CL 104 104  CO2 26 26  BUN 9 20  CREATININE 0.86 0.97  GLUCOSE 121* 92  CALCIUM 8.5 8.5   No results for input(s): LABPT, INR in the last 72 hours.  Her left wrist still has some slight tenderness and slight erythema but is not bothering her that much.  The right mid volar forearm is tender and red.   After sterile prep, I attempted aspiration of the right volar forearm area of cellulitis.  I got about a half cc of material which I have sent for culture.  The forearm has redness and tenderness but is unchanged from yesterday.  Assessment/Plan: Cellulitis, abscess left distal radius area, improving.  Area of cellulitis of right mid volar forearm.  Continue IV antibiotics.   Ellene Bloodsaw 07/15/2014, 7:14 AM

## 2014-07-15 NOTE — Progress Notes (Signed)
[facility] PROGRESS NOTE  ANJOLI DIEMER IOX:[id number] DOB: [birth year] DOA: 07/11/2014 PCP: [name] Patient  Assessment/Plan:  Bilateral forearm cellulitis, abscesses. Left arm continues to improve with IV antibiotics s/p aspiration 07/12/14. Culture with few gram + cocci in clusters. Culture re-incubated for better growth per chart.  Right arm with increased erythema and pain, area soft with some fluctuance and pale center s/p aspiration today. Continue Vanc and Zosy day #4. Will likely transition to po tomorrow.  Orthopedics appreciated. She remains afebrile and non-toxic appearing.   Polysubstance abuse, IV drug use. HIV negative.  COPD. Remains stable.  Anxiety, schizophrenia, bipolar disorder, chronic back pain per chart. Stable.  Hepatitis C. Outpatient follow-up with GI recommended.  Severe malnutrition  Code Status: full Family Communication: significant other at bedside Disposition Plan: hopefully tomorrow   Consultants:  Dr Luna Glasgow orthopedics  Procedures:  4/1 needle aspiration of left forearm abscess  4/4 needle aspiration of right forearm abscess  Antibiotics:  Piperacillin tazobactam 4/1 >>  Vancomycin 4/1 >>   HPI/Subjective: Sitting up watching tv. Reports less pain in left arm but increased pain in right arm. Denies nausea  Objective: Filed Vitals:   07/15/14 1349  BP: 106/61  Pulse: 61  Temp: 98.8 F (37.1 C)  Resp: 18    Intake/Output Summary (Last 24 hours) at 07/15/14 1455 Last data filed at 07/15/14 1400  Gross per 24 hour  Intake   2160 ml  Output   2350 ml  Net   -190 ml   Filed Weights   07/11/14 2238  Weight: 81.647 kg (180 lb)    Exam:   General:  Well nourished appears comfortable  Cardiovascular: RRR no MGR No LE edema  Respiratory: normal effort BS clear bilaterally no wheeze no rhonchi  Abdomen: soft +BS non-tender to palpation  Musculoskeletal: right volar forearm with increasing area induration,  erythema, edema and tenderness. No fluctuance. Left forearm with lessening erythema, fluctuance and pain. Left hand no edema  Data Reviewed: Basic Metabolic Panel:  Recent Labs Lab 07/11/14 2330 07/13/14 0655 07/15/14 0444  NA 135 136 137  K 3.4* 3.9 4.1  CL 101 104 104  CO2 28 26 26   GLUCOSE 107* 121* 92  BUN 8 9 20   CREATININE 0.90 0.86 0.97  CALCIUM 8.4 8.5 8.5   Liver Function Tests:  Recent Labs Lab 07/11/14 2330  AST 57*  ALT 95*  ALKPHOS 64  BILITOT 0.4  PROT 7.1  ALBUMIN 3.2*   No results for input(s): LIPASE, AMYLASE in the last 168 hours. No results for input(s): AMMONIA in the last 168 hours. CBC:  Recent Labs Lab 07/11/14 2330 07/13/14 0655  WBC 12.0* 9.3  NEUTROABS 8.0*  --   HGB 11.2* 11.6*  HCT 33.2* 34.8*  MCV 87.1 88.1  PLT 228 265   Cardiac Enzymes:  Recent Labs Lab 07/11/14 2330 07/12/14 0334 07/12/14 0902  TROPONINI <0.03 <0.03 <0.03   BNP (last 3 results) No results for input(s): BNP in the last 8760 hours.  ProBNP (last 3 results) No results for input(s): PROBNP in the last 8760 hours.  CBG: No results for input(s): GLUCAP in the last 168 hours.  Recent Results (from the past 240 hour(s))  Culture, blood (routine x 2)     Status: None (Preliminary result)   Collection Time: 07/11/14 11:30 PM  Result Value Ref Range Status   Specimen Description BLOOD RIGHT FOREARM  Final   Special Requests   Final    BOTTLES  DRAWN AEROBIC AND ANAEROBIC AEB 8CC ANA 5CC   Culture NO GROWTH 4 DAYS  Final   Report Status PENDING  Incomplete  Culture, blood (routine x 2)     Status: None (Preliminary result)   Collection Time: 07/11/14 11:45 PM  Result Value Ref Range Status   Specimen Description BLOOD RIGHT ANTECUBITAL  Final   Special Requests BOTTLES DRAWN AEROBIC AND ANAEROBIC 6CC EACH  Final   Culture NO GROWTH 4 DAYS  Final   Report Status PENDING  Incomplete  Culture, blood (single)     Status: None (Preliminary result)    Collection Time: 07/11/14 11:52 PM  Result Value Ref Range Status   Specimen Description BLOOD RIGHT HAND  Final   Special Requests   Final    BOTTLES DRAWN AEROBIC AND ANAEROBIC AEB 6CC ANA [place]   Culture NO GROWTH 4 DAYS  Final   Report Status PENDING  Incomplete  Urine culture     Status: None   Collection Time: 07/11/14 11:55 PM  Result Value Ref Range Status   Specimen Description URINE, CLEAN CATCH  Final   Special Requests NONE  Final   Colony Count   Final    5,000 COLONIES/ML Performed at [facility]    Culture   Final    INSIGNIFICANT GROWTH Performed at [facility]    Report Status 07/13/2014 FINAL  Final  Culture, routine-abscess     Status: None (Preliminary result)   Collection Time: 07/12/14  3:26 PM  Result Value Ref Range Status   Specimen Description ABSCESS LEFT ARM  Final   Special Requests NONE  Final   Gram Stain   Final    ABUNDANT WBC PRESENT, PREDOMINANTLY PMN NO SQUAMOUS EPITHELIAL CELLS SEEN FEW GRAM POSITIVE COCCI IN PAIRS IN CLUSTERS Performed at [facility]    Culture   Final    Culture reincubated for better growth Performed at [facility]    Report Status PENDING  Incomplete     Studies: No results found.  Scheduled Meds: . docusate sodium  100 mg Oral BID  . enoxaparin (LOVENOX) injection  40 mg Subcutaneous Q24H  . piperacillin-tazobactam (ZOSYN)  IV  3.375 g Intravenous Q8H  . traZODone  50 mg Oral QHS,MR X 1  . vancomycin  1,500 mg Intravenous Q12H   Continuous Infusions:   Principal Problem:   Abscess of forearm, left Active Problems:   Polysubstance abuse   Opioid dependence   Cellulitis and abscess   Protein-calorie malnutrition, severe   IV drug abuse   Hepatitis C infection    Time spent: 30 minutes    [facility] Pager [phone]. If 7PM-7AM, please contact night-coverage at [email or web address], password [facility] 07/15/2014, 2:55 PM  LOS: 3 days

## 2014-07-16 DIAGNOSIS — L02413 Cutaneous abscess of right upper limb: Secondary | ICD-10-CM

## 2014-07-16 DIAGNOSIS — F1910 Other psychoactive substance abuse, uncomplicated: Secondary | ICD-10-CM

## 2014-07-16 DIAGNOSIS — L0390 Cellulitis, unspecified: Secondary | ICD-10-CM

## 2014-07-16 DIAGNOSIS — L02414 Cutaneous abscess of left upper limb: Secondary | ICD-10-CM

## 2014-07-16 DIAGNOSIS — L0291 Cutaneous abscess, unspecified: Secondary | ICD-10-CM | POA: Insufficient documentation

## 2014-07-16 LAB — BASIC METABOLIC PANEL
Anion gap: 8 (ref 5–15)
BUN: 16 mg/dL (ref 6–23)
CO2: 24 mmol/L (ref 19–32)
Calcium: 8.9 mg/dL (ref 8.4–10.5)
Chloride: 104 mmol/L (ref 96–112)
Creatinine, Ser: 0.81 mg/dL (ref 0.50–1.10)
GFR calc Af Amer: >90 mL/min (ref >90)
GFR calc non Af Amer: 88 mL/min — ABNORMAL LOW (ref >90)
Glucose, Bld: 109 mg/dL — ABNORMAL HIGH (ref 70–99)
Potassium: 4.3 mmol/L (ref 3.5–5.1)
Sodium: 136 mmol/L (ref 135–145)

## 2014-07-16 LAB — CULTURE, BLOOD (ROUTINE X 2)
Culture: NO GROWTH
Culture: NO GROWTH

## 2014-07-16 LAB — CBC
HCT: 40.7 % (ref 36.0–46.0)
Hemoglobin: 13.6 g/dL (ref 12.0–15.0)
MCH: 29.2 pg (ref 26.0–34.0)
MCHC: 33.4 g/dL (ref 30.0–36.0)
MCV: 87.5 fL (ref 78.0–100.0)
Platelets: 360 10*3/uL (ref 150–400)
RBC: 4.65 MIL/uL (ref 3.87–5.11)
RDW: 14.4 % (ref 11.5–15.5)
WBC: 8.6 10*3/uL (ref 4.0–10.5)

## 2014-07-16 LAB — CULTURE, BLOOD (SINGLE): Culture: NO GROWTH

## 2014-07-16 MED ORDER — AMOXICILLIN-POT CLAVULANATE 875-125 MG PO TABS: 1.0000 | ORAL_TABLET | Freq: Two times a day (BID) | ORAL | Status: DC

## 2014-07-16 MED ORDER — SULFAMETHOXAZOLE-TRIMETHOPRIM 400-80 MG PO TABS: 1.0000 | ORAL_TABLET | Freq: Two times a day (BID) | ORAL | Status: DC

## 2014-07-16 MED ORDER — ALBUTEROL SULFATE HFA 108 (90 BASE) MCG/ACT IN AERS
2.0000 | INHALATION_SPRAY | Freq: Four times a day (QID) | RESPIRATORY_TRACT | Status: DC | PRN
Start: 2014-07-16 — End: 2016-01-26

## 2014-07-16 MED ORDER — ALBUTEROL SULFATE HFA 108 (90 BASE) MCG/ACT IN AERS: 2.0000 | INHALATION_SPRAY | Freq: Four times a day (QID) | RESPIRATORY_TRACT | Status: DC | PRN

## 2014-07-16 MED ORDER — DOXYCYCLINE HYCLATE 50 MG PO CAPS: 100.0000 mg | ORAL_CAPSULE | Freq: Two times a day (BID) | ORAL | Status: DC

## 2014-07-16 MED ORDER — HYDROCODONE-ACETAMINOPHEN 5-325 MG PO TABS: 1.0000 | ORAL_TABLET | Freq: Four times a day (QID) | ORAL | Status: DC | PRN

## 2014-07-16 NOTE — Progress Notes (Signed)
Subjective: I feel good.  I am ready to go home.   Objective: Vital signs in last 24 hours: Temp:  [98 F (36.7 C)-98.8 F (37.1 C)] 98 F (36.7 C) (04/05 0628) Pulse Rate:  [50-61] 50 (04/05 0628) Resp:  [18] 18 (04/05 0628) BP: (106-114)/(61-68) 114/68 mmHg (04/05 0628) SpO2:  [94 %-97 %] 94 % (04/05 0628)  Intake/Output from previous day: 04/04 0701 - 04/05 0700 In: 1920 [P.O.:720; IV Piggyback:1200] Out: 1900 [Urine:1900] Intake/Output this shift:     Recent Labs  07/16/14 0549  HGB 13.6    Recent Labs  07/16/14 0549  WBC 8.6  RBC 4.65  HCT 40.7  PLT 360    Recent Labs  07/15/14 0444 07/16/14 0549  NA 137 136  K 4.1 4.3  CL 104 104  CO2 26 24  BUN 20 16  CREATININE 0.97 0.81  GLUCOSE 92 109*  CALCIUM 8.5 8.9   No results for input(s): LABPT, INR in the last 72 hours.  Neurologically intact Neurovascular intact Sensation intact distally Intact pulses distally Compartment soft   Her left wrist, distal radius looks good with less swelling and no pain.    Her right forearm is improved with much less redness and less swelling.  She could go home and be treated as outpatient.  Assessment/Plan: Resolving cellulitis and abscess of both arms.   [name] 07/16/2014, 10:15 AM

## 2014-07-16 NOTE — Progress Notes (Signed)
UR chart review completed.  

## 2014-07-16 NOTE — Progress Notes (Signed)
Patient received discharge instructions and scripts and had no further questions or concerns.  Patient in stable condition at discharge.  Patient able to teach back about when to take medications and follow-up appointment.

## 2014-07-16 NOTE — Discharge Summary (Signed)
Physician Discharge Summary  [name] GTX:[id number] DOB: 20-Nov-1970 DOA: 07/11/2014  PCP: [name] Patient  Admit date: 07/11/2014 Discharge date: 07/16/2014  Time spent: 40 minutes  Recommendations for Outpatient Follow-up:  1. [facility] as [facility] to evaluate arm cellulitis and abscess    Discharge Diagnoses:  Principal Problem:   Abscess of forearm, left Active Problems:   Polysubstance abuse   Opioid dependence   Cellulitis and abscess   Protein-calorie malnutrition, severe   IV drug abuse   Hepatitis C infection   Abscess of forearm, right   Discharge Condition: stable  Diet recommendation: regular  Filed Weights   07/11/14 2238  Weight: 81.647 kg (180 lb)    History of present illness:  [name] is an 44 y.o. female with hx of polysubstance abuse ( Met, heroine, cocaine, narcotics, THC, "Molly", amphetamine), hx of PE, anxiety, COPD, presented to the ER on 07/11/14 with swelling of her hand, and a small pustule. She said she was trying to "stab" it with a needle that morning. Work up included a WBC of 12K, Hb of 11 grams per dL and normal renal fx tests. She said she had not been abusing drug any longer. Her UDS showed the presence of Cocaine. She had some chest pain, but no SOB and no pleuritic pain. Marland Kitchen   Hospital Course:   Bilateral forearm cellulitis, abscesses. Provided with IV antibiotics and improved slowly. Left arm s/p aspiration 07/12/14 per orthopedics. Culture with few gram + cocci in clusters and moderate eikenella corrodens. Right arm s/p aspiration 07/15/14 per ortho. She remained afebrile and non-toxic appearing. Will be discharged with augmentin and bactrim for 5 more days to complete to complete 10 day course   Polysubstance abuse, IV drug use. HIV negative.  COPD. Remained stable.  Anxiety, schizophrenia, bipolar disorder, chronic back pain per chart. Stable during this hospitalization.  Hepatitis C. Outpatient follow-up  with GI recommended.    Procedures:  4/1 needle aspiration of left forearm abscess  4/4 needle aspiration of right forearm abscess  Consultations:  Dr Luna Glasgow ortho  Discharge Exam: Filed Vitals:   07/16/14 0628  BP: 114/68  Pulse: 50  Temp: 98 F (36.7 C)  Resp: 18    General: obese appears comfortable Cardiovascular: RRR no m/g/r no LE edema Respiratory: normal effort BS somewhat coarse with good air flow Skin: bilateral forearms with mild erythema and induration. Mild tenderness no heat.   Discharge Instructions    Current Discharge Medication List    START taking these medications   Details  amoxicillin-clavulanate (AUGMENTIN) 875-125 MG per tablet Take 1 tablet by mouth 2 (two) times daily. Qty: 10 tablet, Refills: 0    HYDROcodone-acetaminophen (NORCO/VICODIN) 5-325 MG per tablet Take 1 tablet by mouth every 6 (six) hours as needed for moderate pain. Qty: 20 tablet, Refills: 0    sulfamethoxazole-trimethoprim (BACTRIM) 400-80 MG per tablet Take 1 tablet by mouth 2 (two) times daily. Qty: 10 tablet, Refills: 0      CONTINUE these medications which have CHANGED   Details  albuterol (PROVENTIL HFA;VENTOLIN HFA) 108 (90 BASE) MCG/ACT inhaler Inhale 2 puffs into the lungs every 6 (six) hours as needed for wheezing or shortness of breath. Qty: 1 Inhaler, Refills: 1      CONTINUE these medications which have NOT CHANGED   Details  hydrOXYzine (ATARAX/VISTARIL) 25 MG tablet Take 1 tablet (25 mg total) by mouth every 6 (six) hours as needed for anxiety. Qty: 60 tablet, Refills:  0    sodium chloride (OCEAN) 0.65 % SOLN nasal spray Place 1 spray into both nostrils as needed for congestion.    traZODone (DESYREL) 50 MG tablet Take 1 tablet (50 mg total) by mouth at bedtime and may repeat dose one time if needed. For sleep Qty: 60 tablet, Refills: 0      STOP taking these medications     oxyCODONE (OXY IR/ROXICODONE) 5 MG immediate release tablet         Allergies  Allergen Reactions  . Doxycycline Other (See Comments)    burns  . Pyridium [Phenazopyridine Hcl] Other (See Comments)    Irritation to skin  . Tramadol Nausea And Vomiting   Follow-up Information    Follow up with Alphia Kava On 08/09/2014.   Why:  at 10:45   Contact information:   [place] [id number] [phone]        The results of significant diagnostics from this hospitalization (including imaging, microbiology, ancillary and laboratory) are listed below for reference.    Significant Diagnostic Studies: Dg Chest 2 View  07/12/2014   CLINICAL DATA:  Chest pain, shortness of breath, IV drug user.  EXAM: CHEST  2 VIEW  COMPARISON:  Most recent chest radiograph 02/09/2013  FINDINGS: The lungs are hyperinflated with coarsened interstitial markings. Question of small focal opacity at the left costophrenic angle. There is otherwise no confluent airspace disease. The heart is at the upper limits of normal in size. There is no pleural effusion or pneumothorax. No acute osseous abnormalities.  IMPRESSION: 1. Hyperinflation with coarse interstitial markings, may reflect emphysema. 2. Questionable small focal opacity at the left costophrenic angle, may reflect atelectasis versus pneumonitis.   Electronically Signed   By: Jeb Levering M.D.   On: 07/12/2014 01:01   Mr Forearm Left Wo/w Cm  07/12/2014   CLINICAL DATA:  Painful swelling to the entire left arm with redness. Limited mobility. Recent IV drug use.  EXAM: MRI OF THE LEFT FOREARM WITHOUT AND WITH CONTRAST  TECHNIQUE: Multiplanar, multisequence MR imaging was performed both before and after administration of intravenous contrast.  CONTRAST:  28m MULTIHANCE GADOBENATE DIMEGLUMINE 529 MG/ML IV SOLN  COMPARISON:  None.  FINDINGS: There is no marrow signal abnormality. There is no fracture or dislocation. There is no periosteal reaction. There is no bone destruction.  The flexor and extensor  compartment muscles and tendons are normal in signal. There is severe edema in the subcutaneous fat circumferentially around the right forearm and dorsal aspect of the hand with enhancement on postcontrast imaging most consistent with cellulitis. There is a 2 x 1.5 x 2.1 cm multiloculated peripherally enhancing fluid collection in the distal lateral forearm at the level of the radial metaphysis most concerning for an abscess.  IMPRESSION: 1. Severe edema in the subcutaneous fat circumferentially around the right forearm and dorsal aspect of the hand with enhancement on postcontrast imaging most consistent with cellulitis. There is a 2 x 1.5 x 2.1 cm multiloculated peripherally enhancing fluid collection in the distal lateral forearm at the level of the radial metaphysis most concerning for an abscess.   Electronically Signed   By: HKathreen Devoid  On: 07/12/2014 12:56    Microbiology: Recent Results (from the past 240 hour(s))  Culture, blood (routine x 2)     Status: None   Collection Time: 07/11/14 11:30 PM  Result Value Ref Range Status   Specimen Description BLOOD RIGHT FOREARM  Final  Special Requests   Final    BOTTLES DRAWN AEROBIC AND ANAEROBIC AEB 8CC ANA 5CC   Culture NO GROWTH 5 DAYS  Final   Report Status 07/16/2014 FINAL  Final  Culture, blood (routine x 2)     Status: None   Collection Time: 07/11/14 11:45 PM  Result Value Ref Range Status   Specimen Description BLOOD RIGHT ANTECUBITAL  Final   Special Requests BOTTLES DRAWN AEROBIC AND ANAEROBIC 6CC EACH  Final   Culture NO GROWTH 5 DAYS  Final   Report Status 07/16/2014 FINAL  Final  Culture, blood (single)     Status: None   Collection Time: 07/11/14 11:52 PM  Result Value Ref Range Status   Specimen Description BLOOD RIGHT HAND  Final   Special Requests   Final    BOTTLES DRAWN AEROBIC AND ANAEROBIC AEB 6CC ANA [place]   Culture NO GROWTH 5 DAYS  Final   Report Status 07/16/2014 FINAL  Final  Urine culture     Status: None    Collection Time: 07/11/14 11:55 PM  Result Value Ref Range Status   Specimen Description URINE, CLEAN CATCH  Final   Special Requests NONE  Final   Colony Count   Final    5,000 COLONIES/ML Performed at [facility]    Culture   Final    INSIGNIFICANT GROWTH Performed at [facility]    Report Status 07/13/2014 FINAL  Final  Culture, routine-abscess     Status: None (Preliminary result)   Collection Time: 07/12/14  3:26 PM  Result Value Ref Range Status   Specimen Description ABSCESS LEFT ARM  Final   Special Requests NONE  Final   Gram Stain   Final    ABUNDANT WBC PRESENT, PREDOMINANTLY PMN NO SQUAMOUS EPITHELIAL CELLS SEEN FEW GRAM POSITIVE COCCI IN PAIRS IN CLUSTERS Performed at [facility]    Culture   Final    MODERATE EIKENELLA CORRODENS Note: Usually susceptible to penicillin and other beta lactam agents,quinolones,macrolides and tetracyclines. Performed at [facility]    Report Status PENDING  Incomplete  Culture, routine-abscess     Status: None (Preliminary result)   Collection Time: 07/15/14  7:29 AM  Result Value Ref Range Status   Specimen Description ABSCESS RIGHT FOREARM  Final   Special Requests NONE  Final   Gram Stain   Final    ABUNDANT WBC PRESENT,BOTH PMN AND MONONUCLEAR NO SQUAMOUS EPITHELIAL CELLS SEEN NO ORGANISMS SEEN Performed at [facility]    Culture   Final    NO GROWTH 1 DAY Performed at [facility]    Report Status PENDING  Incomplete     Labs: Basic Metabolic Panel:  Recent Labs Lab 07/11/14 2330 07/13/14 0655 07/15/14 0444 07/16/14 0549  NA 135 136 137 136  K 3.4* 3.9 4.1 4.3  CL 101 104 104 104  CO2 _0 GLUCOSE 107* 121* 92 109*  BUN _1 CREATININE 0.90 0.86 0.97 0.81  CALCIUM 8.4 8.5 8.5 8.9   Liver Function Tests:  Recent Labs Lab 07/11/14 2330  AST 57*  ALT 95*  ALKPHOS 64  BILITOT 0.4  PROT 7.1  ALBUMIN 3.2*   No results for  input(s): LIPASE, AMYLASE in the last 168 hours. No results for input(s): AMMONIA in the last 168 hours. CBC:  Recent Labs Lab 07/11/14 2330 07/13/14 0655 07/16/14 0549  WBC 12.0* 9.3 8.6  NEUTROABS 8.0*  --   --  HGB 11.2* 11.6* 13.6  HCT 33.2* 34.8* 40.7  MCV 87.1 88.1 87.5  PLT 228 265 360   Cardiac Enzymes:  Recent Labs Lab 07/11/14 2330 07/12/14 0334 07/12/14 0902  TROPONINI <0.03 <0.03 <0.03   BNP: BNP (last 3 results) No results for input(s): BNP in the last 8760 hours.  ProBNP (last 3 results) No results for input(s): PROBNP in the last 8760 hours.  CBG: No results for input(s): GLUCAP in the last 168 hours.     SignedRadene Gunning  [facility] 07/16/2014, 12:56 PM

## 2014-07-18 LAB — CULTURE, ROUTINE-ABSCESS: Culture: NO GROWTH

## 2014-08-08 ENCOUNTER — Emergency Department (HOSPITAL_COMMUNITY)
Admission: EM | Admit: 2014-08-08 | Discharge: 2014-08-08 | Disposition: A | Discharge status: Home / Self Care | Payer: Medicare Other | Attending: Emergency Medicine | Admitting: Emergency Medicine

## 2014-08-08 ENCOUNTER — Emergency Department (HOSPITAL_COMMUNITY): Payer: Medicare Other

## 2014-08-08 ENCOUNTER — Encounter (HOSPITAL_COMMUNITY): Payer: Self-pay

## 2014-08-08 DIAGNOSIS — R079 Chest pain, unspecified: Secondary | ICD-10-CM | POA: Diagnosis present

## 2014-08-08 DIAGNOSIS — Z79899 Other long term (current) drug therapy: Secondary | ICD-10-CM | POA: Insufficient documentation

## 2014-08-08 DIAGNOSIS — G8929 Other chronic pain: Secondary | ICD-10-CM | POA: Insufficient documentation

## 2014-08-08 DIAGNOSIS — Z720 Tobacco use: Secondary | ICD-10-CM | POA: Insufficient documentation

## 2014-08-08 DIAGNOSIS — R51 Headache: Secondary | ICD-10-CM | POA: Diagnosis not present

## 2014-08-08 DIAGNOSIS — H53149 Visual discomfort, unspecified: Secondary | ICD-10-CM | POA: Insufficient documentation

## 2014-08-08 DIAGNOSIS — Z792 Long term (current) use of antibiotics: Secondary | ICD-10-CM | POA: Diagnosis not present

## 2014-08-08 DIAGNOSIS — R0789 Other chest pain: Secondary | ICD-10-CM

## 2014-08-08 DIAGNOSIS — Z8619 Personal history of other infectious and parasitic diseases: Secondary | ICD-10-CM | POA: Insufficient documentation

## 2014-08-08 DIAGNOSIS — M542 Cervicalgia: Secondary | ICD-10-CM | POA: Insufficient documentation

## 2014-08-08 DIAGNOSIS — E876 Hypokalemia: Secondary | ICD-10-CM

## 2014-08-08 DIAGNOSIS — R05 Cough: Secondary | ICD-10-CM | POA: Diagnosis not present

## 2014-08-08 DIAGNOSIS — Z86711 Personal history of pulmonary embolism: Secondary | ICD-10-CM | POA: Insufficient documentation

## 2014-08-08 DIAGNOSIS — F1410 Cocaine abuse, uncomplicated: Secondary | ICD-10-CM | POA: Diagnosis not present

## 2014-08-08 DIAGNOSIS — R519 Headache, unspecified: Secondary | ICD-10-CM

## 2014-08-08 DIAGNOSIS — F410 Panic disorder [episodic paroxysmal anxiety] without agoraphobia: Secondary | ICD-10-CM | POA: Insufficient documentation

## 2014-08-08 DIAGNOSIS — J441 Chronic obstructive pulmonary disease with (acute) exacerbation: Secondary | ICD-10-CM | POA: Diagnosis not present

## 2014-08-08 LAB — COMPREHENSIVE METABOLIC PANEL
ALT: 78 U/L — ABNORMAL HIGH (ref 0–35)
AST: 81 U/L — ABNORMAL HIGH (ref 0–37)
Albumin: 3.2 g/dL — ABNORMAL LOW (ref 3.5–5.2)
Alkaline Phosphatase: 109 U/L (ref 39–117)
Anion gap: 9 (ref 5–15)
BUN: 10 mg/dL (ref 6–23)
CO2: 26 mmol/L (ref 19–32)
Calcium: 8.5 mg/dL (ref 8.4–10.5)
Chloride: 105 mmol/L (ref 96–112)
Creatinine, Ser: 0.85 mg/dL (ref 0.50–1.10)
GFR calc Af Amer: >90 mL/min (ref >90)
GFR calc non Af Amer: 83 mL/min — ABNORMAL LOW (ref >90)
Glucose, Bld: 82 mg/dL (ref 70–99)
Potassium: 2.8 mmol/L — ABNORMAL LOW (ref 3.5–5.1)
Sodium: 140 mmol/L (ref 135–145)
Total Bilirubin: 0.9 mg/dL (ref 0.3–1.2)
Total Protein: 7.3 g/dL (ref 6.0–8.3)

## 2014-08-08 LAB — CBC WITH DIFFERENTIAL/PLATELET
Basophils Absolute: 0 10*3/uL (ref 0.0–0.1)
Basophils Relative: 0 % (ref 0–1)
Eosinophils Absolute: 0 10*3/uL (ref 0.0–0.7)
Eosinophils Relative: 1 % (ref 0–5)
HCT: 37.4 % (ref 36.0–46.0)
Hemoglobin: 12.7 g/dL (ref 12.0–15.0)
Lymphocytes Relative: 14 % (ref 12–46)
Lymphs Abs: 0.6 10*3/uL — ABNORMAL LOW (ref 0.7–4.0)
MCH: 29.2 pg (ref 26.0–34.0)
MCHC: 34 g/dL (ref 30.0–36.0)
MCV: 86 fL (ref 78.0–100.0)
Monocytes Absolute: 0 10*3/uL — ABNORMAL LOW (ref 0.1–1.0)
Monocytes Relative: 0 % — ABNORMAL LOW (ref 3–12)
Neutro Abs: 3.5 10*3/uL (ref 1.7–7.7)
Neutrophils Relative %: 85 % — ABNORMAL HIGH (ref 43–77)
Platelets: 244 10*3/uL (ref 150–400)
RBC: 4.35 MIL/uL (ref 3.87–5.11)
RDW: 13.9 % (ref 11.5–15.5)
WBC: 4.1 10*3/uL (ref 4.0–10.5)

## 2014-08-08 LAB — RAPID URINE DRUG SCREEN, HOSP PERFORMED
Amphetamines: NOT DETECTED
Barbiturates: NOT DETECTED
Benzodiazepines: POSITIVE — AB
Cocaine: POSITIVE — AB
Opiates: POSITIVE — AB
Tetrahydrocannabinol: NOT DETECTED

## 2014-08-08 LAB — PROTIME-INR
INR: 1.12 (ref 0.00–1.49)
Prothrombin Time: 14.5 seconds (ref 11.6–15.2)

## 2014-08-08 LAB — APTT: aPTT: 48 seconds — ABNORMAL HIGH (ref 24–37)

## 2014-08-08 LAB — TROPONIN I
Troponin I: <0.03 ng/mL (ref <0.031)
Troponin I: <0.03 ng/mL (ref <0.031)

## 2014-08-08 MED ORDER — METOCLOPRAMIDE HCL 5 MG/ML IJ SOLN
10.0000 mg | Freq: Once | INTRAMUSCULAR | Status: AC
  Administered 2014-08-08: 10 mg via INTRAVENOUS
  Filled 2014-08-08: qty 2

## 2014-08-08 MED ORDER — DIPHENHYDRAMINE HCL 50 MG/ML IJ SOLN
25.0000 mg | Freq: Once | INTRAMUSCULAR | Status: AC
  Administered 2014-08-08: 25 mg via INTRAVENOUS
  Filled 2014-08-08: qty 1

## 2014-08-08 MED ORDER — KETOROLAC TROMETHAMINE 30 MG/ML IJ SOLN
30.0000 mg | Freq: Once | INTRAMUSCULAR | Status: AC
  Administered 2014-08-08: 30 mg via INTRAVENOUS
  Filled 2014-08-08: qty 1

## 2014-08-08 MED ORDER — POTASSIUM CHLORIDE CRYS ER 20 MEQ PO TBCR
40.0000 meq | EXTENDED_RELEASE_TABLET | Freq: Once | ORAL | Status: AC
  Administered 2014-08-08: 40 meq via ORAL
  Filled 2014-08-08: qty 2

## 2014-08-08 MED ORDER — SODIUM CHLORIDE 0.9 % IV SOLN: 1000.0000 mL | INTRAVENOUS | Status: DC

## 2014-08-08 MED ORDER — IPRATROPIUM BROMIDE 0.02 % IN SOLN: 0.5000 mg | Freq: Once | RESPIRATORY_TRACT | Status: DC

## 2014-08-08 MED ORDER — ALBUTEROL SULFATE (2.5 MG/3ML) 0.083% IN NEBU
INHALATION_SOLUTION | RESPIRATORY_TRACT | Status: AC
  Administered 2014-08-08: 2.5 mg
  Filled 2014-08-08: qty 3

## 2014-08-08 MED ORDER — SODIUM CHLORIDE 0.9 % IJ SOLN
INTRAMUSCULAR | Status: AC
  Filled 2014-08-08: qty 45

## 2014-08-08 MED ORDER — IPRATROPIUM-ALBUTEROL 0.5-2.5 (3) MG/3ML IN SOLN
RESPIRATORY_TRACT | Status: AC
  Administered 2014-08-08: 3 mL
  Filled 2014-08-08: qty 3

## 2014-08-08 MED ORDER — ALBUTEROL SULFATE (2.5 MG/3ML) 0.083% IN NEBU: 5.0000 mg | INHALATION_SOLUTION | Freq: Once | RESPIRATORY_TRACT | Status: DC

## 2014-08-08 MED ORDER — POTASSIUM CHLORIDE CRYS ER 20 MEQ PO TBCR: 20.0000 meq | EXTENDED_RELEASE_TABLET | Freq: Two times a day (BID) | ORAL | Status: DC

## 2014-08-08 MED ORDER — SODIUM CHLORIDE 0.9 % IV SOLN: 1000.0000 mL | Freq: Once | INTRAVENOUS | Status: DC

## 2014-08-08 MED ORDER — METHYLPREDNISOLONE SODIUM SUCC 125 MG IJ SOLR
125.0000 mg | Freq: Once | INTRAMUSCULAR | Status: AC
  Administered 2014-08-08: 125 mg via INTRAVENOUS
  Filled 2014-08-08: qty 2

## 2014-08-08 NOTE — ED Notes (Signed)
Pt c/o chest pain and headache

## 2014-08-08 NOTE — ED Notes (Signed)
Patient resting in bed with eyes closed.  NS infusing without difficulty.

## 2014-08-08 NOTE — ED Notes (Signed)
Discharge instructions and prescription given and reviewed with patient.  Patient verbalized understanding to take KCL as directed and to keep scheduled appointment with her new doctor.  Patient ambulatory; discharged home in good condition.

## 2014-08-08 NOTE — ED Provider Notes (Signed)
CSN: [id number]     Arrival date & time 08/08/14  0026 History  This chart was scribed for [name], MD by Tula Nakayama, ED Scribe. This patient was seen in room APA12/APA12 and the patient's care was started at 1:02 AM.    Chief Complaint  Patient presents with  . Chest Pain   The history is provided by the patient. No language interpreter was used.    HPI Comments: [name] is a 44 y.o. female who presents to the Emergency Department complaining of constant, moderate left-sided CP that radiates to her central chest and started 4 hours ago while at rest lying down to go to bed. She reports SOB that accompanied the CP, productive cough with brownish green sputum and wheezing as associated symptoms. She also notes HA, photophobia and posterior neck pain described as tightness that started this morning. Pt states CP becomes worse with cough and movement She took Aspirin 325 mg 40 minutes after the onset of symptoms with no relief. Pt also took an Opana 5 mg (her friends) this morning for HA with no improvement to her symptoms. Pt has a history of similar HA, but denies a history of similar CP. She reports increased stress at home due to financial issues. Pt smokes 1 ppd. She denies EtOH use. Pt is disabled for bipolar disorder, Schizophrenia and chronic back pain. She denies nausea, diaphoresis and fever as associated symptoms.She has a hx of polysubstance abuse including cocaine and methamphetamine, but denies currently.  No PCP  Past Medical History  Diagnosis Date  . COPD (chronic obstructive pulmonary disease)   . Anxiety   . Panic attacks   . Pulmonary embolism   . Chronic back pain   . DDD (degenerative disc disease)   . Chronic knee pain   . Polysubstance abuse     cocaine, opiates, marijuana, amphetamines, "molly," crystal meth  . Hepatitis C infection 07/13/2014   Past Surgical History  Procedure Laterality Date  . Cesarean section    . Nose surgery    . Tubes tied      Family History  Problem Relation Age of Onset  . Arthritis    . Lung disease    . Cancer    . Asthma     History  Substance Use Topics  . Smoking status: Current Every Day Smoker -- 0.50 packs/day for 25 years    Types: Cigarettes  . Smokeless tobacco: Never Used  . Alcohol Use: No   On disability for bipolar and schizophrenia and back problems Smokes 1 ppd  OB History    Gravida Para Term Preterm AB TAB SAB Ectopic Multiple Living   2 2 2       2      Review of Systems  Constitutional: Negative for fever and diaphoresis.  Eyes: Positive for photophobia.  Respiratory: Positive for cough, shortness of breath and wheezing.   Cardiovascular: Positive for chest pain.  Gastrointestinal: Negative for nausea.  Musculoskeletal: Positive for neck pain.  Neurological: Positive for headaches.  All other systems reviewed and are negative.  Allergies  Doxycycline; Pyridium; and Tramadol  Home Medications   Prior to Admission medications   Medication Sig Start Date End Date Taking? Authorizing Provider  albuterol (PROVENTIL HFA;VENTOLIN HFA) 108 (90 BASE) MCG/ACT inhaler Inhale 2 puffs into the lungs every 6 (six) hours as needed for wheezing or shortness of breath. 07/16/14   Radene Gunning, NP  amoxicillin-clavulanate (AUGMENTIN) 875-125 MG per tablet Take 1 tablet  by mouth 2 (two) times daily. 07/16/14   Radene Gunning, NP  HYDROcodone-acetaminophen (NORCO/VICODIN) 5-325 MG per tablet Take 1 tablet by mouth every 6 (six) hours as needed for moderate pain. 07/16/14   Radene Gunning, NP  hydrOXYzine (ATARAX/VISTARIL) 25 MG tablet Take 1 tablet (25 mg total) by mouth every 6 (six) hours as needed for anxiety. 06/18/14   Encarnacion Slates, NP  potassium chloride SA (K-DUR,KLOR-CON) 20 MEQ tablet Take 1 tablet (20 mEq total) by mouth 2 (two) times daily. 08/08/14   [name], MD  sodium chloride (OCEAN) 0.65 % SOLN nasal spray Place 1 spray into both nostrils as needed for congestion.    Historical  Provider, MD  sulfamethoxazole-trimethoprim (BACTRIM) 400-80 MG per tablet Take 1 tablet by mouth 2 (two) times daily. 07/16/14   Radene Gunning, NP  sulfamethoxazole-trimethoprim (BACTRIM) 400-80 MG per tablet Take 1 tablet by mouth 2 (two) times daily. 07/16/14   Radene Gunning, NP  traZODone (DESYREL) 50 MG tablet Take 1 tablet (50 mg total) by mouth at bedtime and may repeat dose one time if needed. For sleep 06/18/14   Encarnacion Slates, NP   BP 100/64 mmHg  Pulse 101  Temp(Src) 98.8 F (37.1 C) (Oral)  Resp 16  Ht 5\' 1"  (1.549 m)  Wt 220 lb (99.791 kg)  BMI 41.59 kg/m2  SpO2 98%  LMP 07/28/2014  Vital signs normal except for tachycardia  Physical Exam  Constitutional: She is oriented to person, place, and time. She appears well-developed and well-nourished.  Non-toxic appearance. She does not appear ill. No distress.  HENT:  Head: Normocephalic and atraumatic.  Right Ear: External ear normal.  Left Ear: External ear normal.  Nose: Nose normal. No mucosal edema or rhinorrhea.  Mouth/Throat: Oropharynx is clear and moist and mucous membranes are normal. No dental abscesses or uvula swelling.  Eyes: Conjunctivae and EOM are normal. Pupils are equal, round, and reactive to light.  Neck: Normal range of motion and full passive range of motion without pain. Neck supple. No tracheal deviation present.  Cardiovascular: Normal rate, regular rhythm and normal heart sounds.  Exam reveals no gallop and no friction rub.   No murmur heard. Pulmonary/Chest: Effort normal. No respiratory distress. She has wheezes. She has no rhonchi. She has no rales. She exhibits no tenderness and no crepitus.  Few, scattered expiratory wheezing, coughs with deep breaths  Abdominal: Soft. Normal appearance and bowel sounds are normal. She exhibits no distension. There is no tenderness. There is no rebound and no guarding.  Musculoskeletal: Normal range of motion. She exhibits no edema or tenderness.  Moves all  extremities well.   Neurological: She is alert and oriented to person, place, and time. She has normal strength. No cranial nerve deficit.  Skin: Skin is warm, dry and intact. No rash noted. No erythema. No pallor.  Psychiatric: Her speech is normal and behavior is normal.  Flat affect  Nursing note and vitals reviewed.   ED Course  Procedures  Medications  0.9 %  sodium chloride infusion (not administered)    Followed by  0.9 %  sodium chloride infusion (not administered)  metoCLOPramide (REGLAN) injection 10 mg (10 mg Intravenous Given 08/08/14 0149)  diphenhydrAMINE (BENADRYL) injection 25 mg (25 mg Intravenous Given 08/08/14 0149)  ketorolac (TORADOL) 30 MG/ML injection 30 mg (30 mg Intravenous Given 08/08/14 0149)  methylPREDNISolone sodium succinate (SOLU-MEDROL) 125 mg/2 mL injection 125 mg (125 mg Intravenous Given 08/08/14 0149)  ipratropium-albuterol (  DUONEB) 0.5-2.5 (3) MG/3ML nebulizer solution (3 mLs  Given 08/08/14 0128)  albuterol (PROVENTIL) (2.5 MG/3ML) 0.083% nebulizer solution (2.5 mg  Given 08/08/14 0129)  potassium chloride SA (K-DUR,KLOR-CON) CR tablet 40 mEq (40 mEq Oral Given 08/08/14 0231)     DIAGNOSTIC STUDIES: Oxygen Saturation is 98% on RA, normal by my interpretation.    COORDINATION OF CARE: 1:18AM Discussed treatment plan with pt at bedside and pt agreed to plan.  Reviewing her laboratory results patient was started on oral potassium.  2:30 AM Patient was rechecked after she got her medications. She states her headache and her chest pain were improving. We discussed getting a second troponin to rule out acute myocardial event. That will be in about another hour.  Recheck at 4:15 AM patient sleeping. She states her headache and chest pain are much improved. With her history of cocaine abuse we discussed doing CT scan and she is agreeable.  05:15 patient's CT scan does not show any acute changes. Patient has been advised to stop the cocaine abuse. She was  surprised to learn it could cause a stroke or heart attack. Hopefully she will stop abusing drugs.  Labs Review Results for orders placed or performed during the hospital encounter of 08/08/14  Comprehensive metabolic panel  Result Value Ref Range   Sodium 140 135 - 145 mmol/L   Potassium 2.8 (L) 3.5 - 5.1 mmol/L   Chloride 105 96 - 112 mmol/L   CO2 26 19 - 32 mmol/L   Glucose, Bld 82 70 - 99 mg/dL   BUN 10 6 - 23 mg/dL   Creatinine, Ser 0.85 0.50 - 1.10 mg/dL   Calcium 8.5 8.4 - 10.5 mg/dL   Total Protein 7.3 6.0 - 8.3 g/dL   Albumin 3.2 (L) 3.5 - 5.2 g/dL   AST 81 (H) 0 - 37 U/L   ALT 78 (H) 0 - 35 U/L   Alkaline Phosphatase 109 39 - 117 U/L   Total Bilirubin 0.9 0.3 - 1.2 mg/dL   GFR calc non Af Amer 83 (L) >90 mL/min   GFR calc Af Amer >90 >90 mL/min   Anion gap 9 5 - 15  CBC with Differential  Result Value Ref Range   WBC 4.1 4.0 - 10.5 K/uL   RBC 4.35 3.87 - 5.11 MIL/uL   Hemoglobin 12.7 12.0 - 15.0 g/dL   HCT 37.4 36.0 - 46.0 %   MCV 86.0 78.0 - 100.0 fL   MCH 29.2 26.0 - 34.0 pg   MCHC 34.0 30.0 - 36.0 g/dL   RDW 13.9 11.5 - 15.5 %   Platelets 244 150 - 400 K/uL   Neutrophils Relative % 85 (H) 43 - 77 %   Neutro Abs 3.5 1.7 - 7.7 K/uL   Lymphocytes Relative 14 12 - 46 %   Lymphs Abs 0.6 (L) 0.7 - 4.0 K/uL   Monocytes Relative 0 (L) 3 - 12 %   Monocytes Absolute 0.0 (L) 0.1 - 1.0 K/uL   Eosinophils Relative 1 0 - 5 %   Eosinophils Absolute 0.0 0.0 - 0.7 K/uL   Basophils Relative 0 0 - 1 %   Basophils Absolute 0.0 0.0 - 0.1 K/uL  Troponin I  Result Value Ref Range   Troponin I <0.03 <0.031 ng/mL  Urine rapid drug screen (hosp performed)  Result Value Ref Range   Opiates POSITIVE (A) NONE DETECTED   Cocaine POSITIVE (A) NONE DETECTED   Benzodiazepines POSITIVE (A) NONE DETECTED   Amphetamines NONE  DETECTED NONE DETECTED   Tetrahydrocannabinol NONE DETECTED NONE DETECTED   Barbiturates NONE DETECTED NONE DETECTED  Protime-INR  Result Value Ref Range    Prothrombin Time 14.5 11.6 - 15.2 seconds   INR 1.12 0.00 - 1.49  APTT  Result Value Ref Range   aPTT 48 (H) 24 - 37 seconds  Troponin I  Result Value Ref Range   Troponin I <0.03 <0.031 ng/mL   Laboratory interpretation all normal except +UDS  Imaging Review Dg Chest 1 View  08/08/2014   CLINICAL DATA:  Acute onset of central chest pain, worsened with deep inspiration or movement. Initial encounter.  EXAM: CHEST  1 VIEW  COMPARISON:  Chest radiograph performed 07/12/2014  FINDINGS: The lungs are well-aerated. Mild vascular congestion is noted. There is no evidence of focal opacification, pleural effusion or pneumothorax. A small focal density at the left lung base is thought reflect overlying costochondral calcification, on correlation with prior CT.  The cardiomediastinal silhouette is within normal limits. No acute osseous abnormalities are seen.  IMPRESSION: Mild vascular congestion noted; lungs remain grossly clear.   Electronically Signed   By: Garald Balding M.D.   On: 08/08/2014 01:25     Ct Head Wo Contrast  08/08/2014   CLINICAL DATA:  Acute onset of headache, photophobia and posterior neck pain and tightness. Initial encounter.  EXAM: CT HEAD WITHOUT CONTRAST  TECHNIQUE: Contiguous axial images were obtained from the base of the skull through the vertex without intravenous contrast.  COMPARISON:  CT of the head performed 01/25/2012  FINDINGS: There is no evidence of acute infarction, mass lesion, or intra- or extra-axial hemorrhage on CT.  The posterior fossa, including the cerebellum, brainstem and fourth ventricle, is within normal limits. The third and lateral ventricles, and basal ganglia are unremarkable in appearance. The cerebral hemispheres are symmetric in appearance, with normal gray-white differentiation. No mass effect or midline shift is seen.  There is no evidence of fracture; visualized osseous structures are unremarkable in appearance. The visualized portions of the  orbits are within normal limits. There is opacification of the visualized portions of the maxillary sinuses, and partial opacification of the ethmoid air cells. The remaining paranasal sinuses and mastoid air cells are well-aerated. No significant soft tissue abnormalities are seen.  IMPRESSION: 1. No acute intracranial pathology seen on CT. 2. Opacification of the visualized portions of the maxillary sinuses.   Electronically Signed   By: Garald Balding M.D.   On: 08/08/2014 04:56      EKG Interpretation   Date/Time:  Thursday August 08 2014 00:33:37 EDT Ventricular Rate:  98 PR Interval:  151 QRS Duration: 94 QT Interval:  356 QTC Calculation: 454 R Axis:   52 Text Interpretation:  Sinus rhythm Since last tracing 12 Jul 2014  Nonspecific ST and T wave abnormality Confirmed by Shant Hence  MD-I, Raeleen Winstanley  ([id number]) on 08/08/2014 12:58:50 AM      MDM   Final diagnoses:  Hypokalemia  Atypical chest pain  Headache, unspecified headache type  Cocaine abuse   New Prescriptions   POTASSIUM CHLORIDE SA (K-DUR,KLOR-CON) 20 MEQ TABLET    Take 1 tablet (20 mEq total) by mouth 2 (two) times daily.    Plan discharge  [name], MD, FACEP    I personally performed the services described in this documentation, which was scribed in my presence. The recorded information has been reviewed and considered.  [name], MD, Barbette Or, MD 08/08/14 [phone]

## 2014-08-08 NOTE — ED Notes (Signed)
Patient resting in bed with eyes closed.  Equal rise and fall of chest noted.

## 2014-08-08 NOTE — Discharge Instructions (Signed)
STOP USING COCAINE!!! It can make you have a stroke or even a heart attack even if you have a normal brain, heart or arteries. Take the potassium pills until gone for your low potassium. Keep your appointment on the 29th with your new doctor.  Recheck as needed.   Hypokalemia Hypokalemia means that the amount of potassium in the blood is lower than normal.Potassium is a chemical, called an electrolyte, that helps regulate the amount of fluid in the body. It also stimulates muscle contraction and helps nerves function properly.Most of the body's potassium is inside of cells, and only a very small amount is in the blood. Because the amount in the blood is so small, minor changes can be life-threatening. CAUSES  Antibiotics.  Diarrhea or vomiting.  Using laxatives too much, which can cause diarrhea.  Chronic kidney disease.  Water pills (diuretics).  Eating disorders (bulimia).  Low magnesium level.  Sweating a lot. SIGNS AND SYMPTOMS  Weakness.  Constipation.  Fatigue.  Muscle cramps.  Mental confusion.  Skipped heartbeats or irregular heartbeat (palpitations).  Tingling or numbness. DIAGNOSIS  Your health care provider can diagnose hypokalemia with blood tests. In addition to checking your potassium level, your health care provider may also check other lab tests. TREATMENT Hypokalemia can be treated with potassium supplements taken by mouth or adjustments in your current medicines. If your potassium level is very low, you may need to get potassium through a vein (IV) and be monitored in the hospital. A diet high in potassium is also helpful. Foods high in potassium are:  Nuts, such as peanuts and pistachios.  Seeds, such as sunflower seeds and pumpkin seeds.  Peas, lentils, and lima beans.  Whole grain and bran cereals and breads.  Fresh fruit and vegetables, such as apricots, avocado, bananas, cantaloupe, kiwi, oranges, tomatoes, asparagus, and potatoes.  Orange  and tomato juices.  Red meats.  Fruit yogurt. HOME CARE INSTRUCTIONS  Take all medicines as prescribed by your health care provider.  Maintain a healthy diet by including nutritious food, such as fruits, vegetables, nuts, whole grains, and lean meats.  If you are taking a laxative, be sure to follow the directions on the label. SEEK MEDICAL CARE IF:  Your weakness gets worse.  You feel your heart pounding or racing.  You are vomiting or having diarrhea.  You are diabetic and having trouble keeping your blood glucose in the normal range. SEEK IMMEDIATE MEDICAL CARE IF:  You have chest pain, shortness of breath, or dizziness.  You are vomiting or having diarrhea for more than 2 days.  You faint. MAKE SURE YOU:   Understand these instructions.  Will watch your condition.  Will get help right away if you are not doing well or get worse. Document Released: 03/29/2005 Document Revised: 01/17/2013 Document Reviewed: 09/29/2012 [facility] Patient Information 2015 [place], [place]. This information is not intended to replace advice given to you by your health care provider. Make sure you discuss any questions you have with your health care provider.    Cocaine Cocaine stimulates the central nervous system. As a stimulant, cocaine has the ability to improve athletic performance through increasing speed, endurance, and concentration, as well as decreasing fatigue. Although cocaine may seem to be beneficial for athletics, it is highly addicting and has many debilitating side effects. Cocaine has caused the deaths of many athletes, and its use is banned by every major athletic organization in the world. The clinical effect of cocaine (the high) is very short in  duration. Cocaine works in the brain by altering the normal concentrations of chemicals that stimulate the brain cells.  WHY ATHLETES USE IT  Many athletes use cocaine for its central nervous system stimulating properties. It is  also used as a recreational drug due to the euphoric felling it produces.  ADVERSE EFFECTS   Sleep disturbances.  Abnormal heart rhythms.  Stroke.  Heart attack.  Seizures.  Elevated blood pressure.  Death.  Paranoia (feeling that people want to hurt you).  Panic attacks (sudden feelings of anxiety or shortness of breath).  Suicidal behavior (wanting to kill yourself).  Homicidal behavior (wanting to kill other people).  Depression (feeling very sad, having decreased energy for activities).  Poor athletic performance. PHARMACOLOGY  Cocaine acts on the body for a short period of time; the clinical effects may last less than1 hour. Since most athletic competitions last for more than 1 hour, cocaine use may not improve athletic performance. The use of cocaine makes individuals much more susceptible for serious conditions such as seizures, arrhythmia (irregular heart beat), and strokes. Even a single dose of cocaine can be detected on a drug test for up to about 30 hours.  PREVENTION Most athletes use cocaine as a recreational drug and not for the purpose of enhancing athletic performance. To prevent the use of cocaine, athletes must be educated on its side effects and the risk of addiction. If an athlete is found using cocaine, counseling and treatment are almost always required.  Document Released: 03/29/2005 Document Revised: 06/21/2011 Document Reviewed: 07/11/2008 [facility] Patient Information 2015 [place], [place]. This information is not intended to replace advice given to you by your health care provider. Make sure you discuss any questions you have with your health care provider.

## 2014-09-21 ENCOUNTER — Encounter (HOSPITAL_COMMUNITY): Payer: Self-pay

## 2014-09-21 ENCOUNTER — Emergency Department (HOSPITAL_COMMUNITY)
Admission: EM | Admit: 2014-09-21 | Discharge: 2014-09-21 | Disposition: A | Discharge status: Home / Self Care | Payer: Medicare Other | Attending: Emergency Medicine | Admitting: Emergency Medicine

## 2014-09-21 DIAGNOSIS — Z8619 Personal history of other infectious and parasitic diseases: Secondary | ICD-10-CM | POA: Diagnosis not present

## 2014-09-21 DIAGNOSIS — Z8739 Personal history of other diseases of the musculoskeletal system and connective tissue: Secondary | ICD-10-CM | POA: Insufficient documentation

## 2014-09-21 DIAGNOSIS — X58XXXA Exposure to other specified factors, initial encounter: Secondary | ICD-10-CM | POA: Diagnosis not present

## 2014-09-21 DIAGNOSIS — T7840XA Allergy, unspecified, initial encounter: Secondary | ICD-10-CM | POA: Insufficient documentation

## 2014-09-21 DIAGNOSIS — F410 Panic disorder [episodic paroxysmal anxiety] without agoraphobia: Secondary | ICD-10-CM | POA: Diagnosis not present

## 2014-09-21 DIAGNOSIS — Y929 Unspecified place or not applicable: Secondary | ICD-10-CM | POA: Insufficient documentation

## 2014-09-21 DIAGNOSIS — Z792 Long term (current) use of antibiotics: Secondary | ICD-10-CM | POA: Insufficient documentation

## 2014-09-21 DIAGNOSIS — Z720 Tobacco use: Secondary | ICD-10-CM | POA: Insufficient documentation

## 2014-09-21 DIAGNOSIS — Z86711 Personal history of pulmonary embolism: Secondary | ICD-10-CM | POA: Diagnosis not present

## 2014-09-21 DIAGNOSIS — J449 Chronic obstructive pulmonary disease, unspecified: Secondary | ICD-10-CM | POA: Insufficient documentation

## 2014-09-21 DIAGNOSIS — Y999 Unspecified external cause status: Secondary | ICD-10-CM | POA: Diagnosis not present

## 2014-09-21 DIAGNOSIS — Y939 Activity, unspecified: Secondary | ICD-10-CM | POA: Insufficient documentation

## 2014-09-21 DIAGNOSIS — G8929 Other chronic pain: Secondary | ICD-10-CM | POA: Insufficient documentation

## 2014-09-21 MED ORDER — EPINEPHRINE 0.3 MG/0.3ML IJ SOAJ: 0.3000 mg | Freq: Once | INTRAMUSCULAR | Status: DC

## 2014-09-21 NOTE — ED Notes (Signed)
EMS reports pt broke out in hives, throat tightening,  And difficulty speaking after eating 3 banana twist snack cakes.  EMS administered 5mg  albuterol, started IV and administered 50mg  benadryl IV, 125 solumedrol IV and 50 mg zantac IV.   Also EMS administered a duobeb enroute.  Reports wheezing subsided.   EMS reports pt looks much better.  Skin still red.  Reports no history of allergic reaction.

## 2014-09-21 NOTE — Discharge Instructions (Signed)
Prescription for EpiPen.  Rest. Benadryl if itching returns

## 2014-09-21 NOTE — ED Provider Notes (Signed)
CSN: [id number]     Arrival date & time 09/21/14  1028 History  This chart was scribed for Nat Christen, MD by Starleen Arms, ED Scribe. This patient was seen in room APA08/APA08 and the patient's care was started at 10:42 AM.   Chief Complaint  Patient presents with  . Allergic Reaction   The history is provided by the patient and the EMS personnel. No language interpreter was used.   HPI Comments: [name] is a 44 y.o. female brought in by ambulance who presents to the Emergency Department complaining of an improving suspected allergic reaction onset 1 hour ago.  Per patient, she ate 3 banana twinkies and developed a generalized, itching rash, a sensation of throat tightness, and a hoarse voice.  Per nursing note, patient was given 5mg  albuterol, started IV and administered 50mg  benadryl IV, 125 solumedrol IV, 50 mg zantac IV, duoneb.  Her symptoms have greatly improved since this treatment.  She has never eaten this type of Twinkie before and has never had a similar episode.    Past Medical History  Diagnosis Date  . COPD (chronic obstructive pulmonary disease)   . Anxiety   . Panic attacks   . Pulmonary embolism   . Chronic back pain   . DDD (degenerative disc disease)   . Chronic knee pain   . Polysubstance abuse     cocaine, opiates, marijuana, amphetamines, "molly," crystal meth  . Hepatitis C infection 07/13/2014   Past Surgical History  Procedure Laterality Date  . Cesarean section    . Nose surgery    . Tubes tied     Family History  Problem Relation Age of Onset  . Arthritis    . Lung disease    . Cancer    . Asthma     History  Substance Use Topics  . Smoking status: Current Every Day Smoker -- 0.50 packs/day for 25 years    Types: Cigarettes  . Smokeless tobacco: Never Used  . Alcohol Use: No   OB History    Gravida Para Term Preterm AB TAB SAB Ectopic Multiple Living   2 2 2       2      Review of Systems A complete 10 system review of systems was  obtained and all systems are negative except as noted in the HPI and PMH.   Allergies  Doxycycline; Pyridium; and Tramadol  Home Medications   Prior to Admission medications   Medication Sig Start Date End Date Taking? Authorizing Provider  albuterol (PROVENTIL HFA;VENTOLIN HFA) 108 (90 BASE) MCG/ACT inhaler Inhale 2 puffs into the lungs every 6 (six) hours as needed for wheezing or shortness of breath. 07/16/14  Yes Lezlie Octave Black, NP  EPINEPHrine (EPIPEN 2-PAK) 0.3 mg/0.3 mL IJ SOAJ injection Inject 0.3 mLs (0.3 mg total) into the muscle once. 09/21/14   Nat Christen, MD  hydrOXYzine (ATARAX/VISTARIL) 25 MG tablet Take 1 tablet (25 mg total) by mouth every 6 (six) hours as needed for anxiety. Patient not taking: Reported on 09/21/2014 06/18/14   Encarnacion Slates, NP  potassium chloride SA (K-DUR,KLOR-CON) 20 MEQ tablet Take 1 tablet (20 mEq total) by mouth 2 (two) times daily. Patient not taking: Reported on 09/21/2014 08/08/14   [name], MD  traZODone (DESYREL) 50 MG tablet Take 1 tablet (50 mg total) by mouth at bedtime and may repeat dose one time if needed. For sleep Patient not taking: Reported on 09/21/2014 06/18/14   Encarnacion Slates, NP  BP 96/47 mmHg  Pulse 73  Temp(Src) 98.4 F (36.9 C) (Oral)  Resp 14  Ht 5\' 1"  (1.549 m)  Wt 170 lb (77.111 kg)  BMI 32.14 kg/m2  SpO2 98%  LMP 08/30/2014 Physical Exam  Constitutional: She is oriented to person, place, and time. She appears well-developed and well-nourished. No distress.  HENT:  Head: Normocephalic and atraumatic.  Eyes: Conjunctivae and EOM are normal.  Neck: Neck supple. No tracheal deviation present.  Cardiovascular: Normal rate.   Pulmonary/Chest: Effort normal. No respiratory distress.  Musculoskeletal: Normal range of motion.  Neurological: She is alert and oriented to person, place, and time.  Skin: Skin is warm and dry.  Psychiatric: She has a normal mood and affect. Her behavior is normal.  Nursing note and vitals  reviewed.   ED Course  Procedures (including critical care time)  DIAGNOSTIC STUDIES: Oxygen Saturation is 94% on RA, adequate by my interpretation.    COORDINATION OF CARE:  10:49 AM Will observe patient and likely discharge home with epi pen.  Patient acknowledges and agrees with plan.    Labs Review Labs Reviewed - No data to display  Imaging Review No results found.   EKG Interpretation   Date/Time:  Saturday September 21 2014 10:34:49 EDT Ventricular Rate:  83 PR Interval:  154 QRS Duration: 92 QT Interval:  391 QTC Calculation: 459 R Axis:   57 Text Interpretation:  Sinus rhythm Low voltage, precordial leads Confirmed  by Lacinda Axon  MD, Jackilyn Umphlett ([id number]) on 09/21/2014 12:35:48 PM      MDM   Final diagnoses:  Allergic reaction, initial encounter    Patient had good response to IV steroid-induced, IV H2 blockers, IV Benadryl, nebulizer treatment. She is hemodynamically stable at discharge  I personally performed the services described in this documentation, which was scribed in my presence. The recorded information has been reviewed and is accurate.    Nat Christen, MD 09/21/14 [phone]

## 2014-09-21 NOTE — ED Notes (Signed)
Patient with no complaints at this time. Respirations even and unlabored. Skin warm/dry. Discharge instructions reviewed with patient at this time. Patient given opportunity to voice concerns/ask questions. IV removed per policy and band-aid applied to site. Patient discharged at this time and left Emergency Department with steady gait.  

## 2014-10-01 ENCOUNTER — Emergency Department (HOSPITAL_COMMUNITY): Payer: Medicare Other

## 2014-10-01 ENCOUNTER — Inpatient Hospital Stay (HOSPITAL_COMMUNITY)
Admission: EM | Admit: 2014-10-01 | Discharge: 2014-10-06 | DRG: 603 | Disposition: A | Discharge status: Home / Self Care | Payer: Medicare Other | Attending: Internal Medicine | Admitting: Internal Medicine

## 2014-10-01 ENCOUNTER — Encounter (HOSPITAL_COMMUNITY): Payer: Self-pay | Admitting: Cardiology

## 2014-10-01 DIAGNOSIS — F17210 Nicotine dependence, cigarettes, uncomplicated: Secondary | ICD-10-CM | POA: Diagnosis present

## 2014-10-01 DIAGNOSIS — Z8249 Family history of ischemic heart disease and other diseases of the circulatory system: Secondary | ICD-10-CM | POA: Diagnosis not present

## 2014-10-01 DIAGNOSIS — Z9981 Dependence on supplemental oxygen: Secondary | ICD-10-CM | POA: Diagnosis not present

## 2014-10-01 DIAGNOSIS — O99320 Drug use complicating pregnancy, unspecified trimester: Secondary | ICD-10-CM | POA: Diagnosis present

## 2014-10-01 DIAGNOSIS — E861 Hypovolemia: Secondary | ICD-10-CM | POA: Diagnosis present

## 2014-10-01 DIAGNOSIS — F1120 Opioid dependence, uncomplicated: Secondary | ICD-10-CM | POA: Diagnosis present

## 2014-10-01 DIAGNOSIS — E876 Hypokalemia: Secondary | ICD-10-CM | POA: Diagnosis present

## 2014-10-01 DIAGNOSIS — L03116 Cellulitis of left lower limb: Principal | ICD-10-CM | POA: Diagnosis present

## 2014-10-01 DIAGNOSIS — Z833 Family history of diabetes mellitus: Secondary | ICD-10-CM | POA: Diagnosis not present

## 2014-10-01 DIAGNOSIS — E871 Hypo-osmolality and hyponatremia: Secondary | ICD-10-CM | POA: Diagnosis present

## 2014-10-01 DIAGNOSIS — J449 Chronic obstructive pulmonary disease, unspecified: Secondary | ICD-10-CM | POA: Diagnosis present

## 2014-10-01 DIAGNOSIS — F410 Panic disorder [episodic paroxysmal anxiety] without agoraphobia: Secondary | ICD-10-CM | POA: Diagnosis present

## 2014-10-01 DIAGNOSIS — Z825 Family history of asthma and other chronic lower respiratory diseases: Secondary | ICD-10-CM

## 2014-10-01 DIAGNOSIS — Z86711 Personal history of pulmonary embolism: Secondary | ICD-10-CM

## 2014-10-01 DIAGNOSIS — F1410 Cocaine abuse, uncomplicated: Secondary | ICD-10-CM | POA: Diagnosis present

## 2014-10-01 DIAGNOSIS — F1910 Other psychoactive substance abuse, uncomplicated: Secondary | ICD-10-CM | POA: Diagnosis not present

## 2014-10-01 DIAGNOSIS — R52 Pain, unspecified: Secondary | ICD-10-CM

## 2014-10-01 DIAGNOSIS — F1210 Cannabis abuse, uncomplicated: Secondary | ICD-10-CM | POA: Diagnosis present

## 2014-10-01 DIAGNOSIS — L0390 Cellulitis, unspecified: Secondary | ICD-10-CM

## 2014-10-01 DIAGNOSIS — M79605 Pain in left leg: Secondary | ICD-10-CM | POA: Diagnosis present

## 2014-10-01 DIAGNOSIS — E669 Obesity, unspecified: Secondary | ICD-10-CM | POA: Diagnosis present

## 2014-10-01 DIAGNOSIS — B1920 Unspecified viral hepatitis C without hepatic coma: Secondary | ICD-10-CM | POA: Diagnosis present

## 2014-10-01 HISTORY — DX: Other psychoactive substance abuse, uncomplicated: F19.10

## 2014-10-01 HISTORY — DX: Calculus of kidney: N20.0

## 2014-10-01 LAB — CBC WITH DIFFERENTIAL/PLATELET
Basophils Absolute: 0.1 10*3/uL (ref 0.0–0.1)
Basophils Relative: 0 % (ref 0–1)
Eosinophils Absolute: 0.1 10*3/uL (ref 0.0–0.7)
Eosinophils Relative: 0 % (ref 0–5)
HCT: 35.5 % — ABNORMAL LOW (ref 36.0–46.0)
Hemoglobin: 12.2 g/dL (ref 12.0–15.0)
Lymphocytes Relative: 17 % (ref 12–46)
Lymphs Abs: 2.7 10*3/uL (ref 0.7–4.0)
MCH: 29.2 pg (ref 26.0–34.0)
MCHC: 34.4 g/dL (ref 30.0–36.0)
MCV: 84.9 fL (ref 78.0–100.0)
Monocytes Absolute: 1.3 10*3/uL — ABNORMAL HIGH (ref 0.1–1.0)
Monocytes Relative: 8 % (ref 3–12)
Neutro Abs: 12.2 10*3/uL — ABNORMAL HIGH (ref 1.7–7.7)
Neutrophils Relative %: 75 % (ref 43–77)
Platelets: 278 10*3/uL (ref 150–400)
RBC: 4.18 MIL/uL (ref 3.87–5.11)
RDW: 15.6 % — ABNORMAL HIGH (ref 11.5–15.5)
WBC: 16.4 10*3/uL — ABNORMAL HIGH (ref 4.0–10.5)

## 2014-10-01 LAB — COMPREHENSIVE METABOLIC PANEL
ALT: 21 U/L (ref 14–54)
AST: 19 U/L (ref 15–41)
Albumin: 3 g/dL — ABNORMAL LOW (ref 3.5–5.0)
Alkaline Phosphatase: 54 U/L (ref 38–126)
Anion gap: 10 (ref 5–15)
BUN: 12 mg/dL (ref 6–20)
CO2: 23 mmol/L (ref 22–32)
Calcium: 8.1 mg/dL — ABNORMAL LOW (ref 8.9–10.3)
Chloride: 98 mmol/L — ABNORMAL LOW (ref 101–111)
Creatinine, Ser: 0.63 mg/dL (ref 0.44–1.00)
GFR calc Af Amer: >60 mL/min (ref >60)
GFR calc non Af Amer: >60 mL/min (ref >60)
Glucose, Bld: 125 mg/dL — ABNORMAL HIGH (ref 65–99)
Potassium: 3.1 mmol/L — ABNORMAL LOW (ref 3.5–5.1)
Sodium: 131 mmol/L — ABNORMAL LOW (ref 135–145)
Total Bilirubin: 0.5 mg/dL (ref 0.3–1.2)
Total Protein: 7.6 g/dL (ref 6.5–8.1)

## 2014-10-01 MED ORDER — HYDROMORPHONE HCL 1 MG/ML IJ SOLN
0.5000 mg | Freq: Once | INTRAMUSCULAR | Status: AC
  Administered 2014-10-02: 0.5 mg via INTRAVENOUS
  Filled 2014-10-01: qty 1

## 2014-10-01 MED ORDER — ALBUTEROL SULFATE (2.5 MG/3ML) 0.083% IN NEBU: 3.0000 mL | INHALATION_SOLUTION | Freq: Four times a day (QID) | RESPIRATORY_TRACT | Status: DC | PRN

## 2014-10-01 MED ORDER — IOHEXOL 300 MG/ML  SOLN
100.0000 mL | Freq: Once | INTRAMUSCULAR | Status: AC | PRN
Start: 2014-10-01 — End: 2014-10-01
  Administered 2014-10-01: 100 mL via INTRAVENOUS

## 2014-10-01 MED ORDER — SODIUM CHLORIDE 0.9 % IV SOLN
INTRAVENOUS | Status: DC
  Administered 2014-10-01: 21:00:00 via INTRAVENOUS

## 2014-10-01 MED ORDER — ONDANSETRON HCL 4 MG/2ML IJ SOLN
4.0000 mg | Freq: Once | INTRAMUSCULAR | Status: AC
  Administered 2014-10-01: 4 mg via INTRAVENOUS
  Filled 2014-10-01: qty 2

## 2014-10-01 MED ORDER — ACETAMINOPHEN 500 MG PO TABS
1000.0000 mg | ORAL_TABLET | Freq: Once | ORAL | Status: AC
  Administered 2014-10-01: 1000 mg via ORAL
  Filled 2014-10-01: qty 2

## 2014-10-01 MED ORDER — ACETAMINOPHEN 325 MG PO TABS
650.0000 mg | ORAL_TABLET | Freq: Four times a day (QID) | ORAL | Status: DC | PRN
  Administered 2014-10-02 – 2014-10-03 (×3): 650 mg via ORAL
  Filled 2014-10-01 (×3): qty 2

## 2014-10-01 MED ORDER — PIPERACILLIN-TAZOBACTAM 3.375 G IVPB
3.3750 g | Freq: Three times a day (TID) | INTRAVENOUS | Status: DC
  Administered 2014-10-02 – 2014-10-05 (×12): 3.375 g via INTRAVENOUS
  Filled 2014-10-01 (×17): qty 50

## 2014-10-01 MED ORDER — HYDROMORPHONE HCL 1 MG/ML IJ SOLN
1.0000 mg | Freq: Once | INTRAMUSCULAR | Status: AC
  Administered 2014-10-01: 1 mg via INTRAVENOUS
  Filled 2014-10-01: qty 1

## 2014-10-01 MED ORDER — VANCOMYCIN HCL IN DEXTROSE 1-5 GM/200ML-% IV SOLN
1000.0000 mg | Freq: Once | INTRAVENOUS | Status: DC
  Filled 2014-10-01: qty 200

## 2014-10-01 MED ORDER — ACETAMINOPHEN 650 MG RE SUPP: 650.0000 mg | Freq: Four times a day (QID) | RECTAL | Status: DC | PRN

## 2014-10-01 MED ORDER — PIPERACILLIN-TAZOBACTAM 3.375 G IVPB: 3.3750 g | Freq: Four times a day (QID) | INTRAVENOUS | Status: DC

## 2014-10-01 MED ORDER — ENOXAPARIN SODIUM 40 MG/0.4ML ~~LOC~~ SOLN
40.0000 mg | SUBCUTANEOUS | Status: DC
  Administered 2014-10-02 – 2014-10-05 (×5): 40 mg via SUBCUTANEOUS
  Filled 2014-10-01 (×5): qty 0.4

## 2014-10-01 MED ORDER — VANCOMYCIN HCL IN DEXTROSE 1-5 GM/200ML-% IV SOLN
1000.0000 mg | Freq: Once | INTRAVENOUS | Status: AC
  Administered 2014-10-01: 1000 mg via INTRAVENOUS
  Filled 2014-10-01: qty 200

## 2014-10-01 MED ORDER — POTASSIUM CHLORIDE CRYS ER 20 MEQ PO TBCR
40.0000 meq | EXTENDED_RELEASE_TABLET | Freq: Once | ORAL | Status: AC
  Administered 2014-10-02: 40 meq via ORAL
  Filled 2014-10-01: qty 2

## 2014-10-01 MED ORDER — SODIUM CHLORIDE 0.9 % IV SOLN: INTRAVENOUS | Status: AC

## 2014-10-01 NOTE — ED Provider Notes (Signed)
CSN: [id number]     Arrival date & time 10/01/14  1743 History   First MD Initiated Contact with Patient 10/01/14 1925     Chief Complaint  Patient presents with  . Cellulitis     (Consider location/radiation/quality/duration/timing/severity/associated sxs/prior Treatment) Patient is a 44 y.o. female presenting with rash. The history is provided by the patient (pt complains of swelling and redness to left thigh).  Rash Location: left thigh. Severity:  Moderate Onset quality:  Sudden Timing:  Constant Chronicity:  New Context: not animal contact   Relieved by:  Nothing Associated symptoms: no abdominal pain, no diarrhea, no fatigue and no headaches     Past Medical History  Diagnosis Date  . COPD (chronic obstructive pulmonary disease)   . Anxiety   . Panic attacks   . Pulmonary embolism   . Chronic back pain   . DDD (degenerative disc disease)   . Chronic knee pain   . Polysubstance abuse     cocaine, opiates, marijuana, amphetamines, "molly," crystal meth  . Hepatitis C infection 07/13/2014  . Nephrolithiasis    Past Surgical History  Procedure Laterality Date  . Cesarean section    . Nose surgery    . Tubes tied     Family History  Problem Relation Age of Onset  . Arthritis    . Lung disease    . Cancer    . Asthma    . COPD Mother   . Hypertension Father   . Heart attack Father   . Diabetes Father    History  Substance Use Topics  . Smoking status: Current Every Day Smoker -- 0.50 packs/day for 25 years    Types: Cigarettes  . Smokeless tobacco: Never Used  . Alcohol Use: No   OB History    Gravida Para Term Preterm AB TAB SAB Ectopic Multiple Living   2 2 2       2      Review of Systems  Constitutional: Negative for appetite change and fatigue.  HENT: Negative for congestion, ear discharge and sinus pressure.   Eyes: Negative for discharge.  Respiratory: Negative for cough.   Cardiovascular: Negative for chest pain.  Gastrointestinal: Negative  for abdominal pain and diarrhea.  Genitourinary: Negative for frequency and hematuria.  Musculoskeletal: Negative for back pain.  Skin: Positive for rash.  Neurological: Negative for seizures and headaches.  Psychiatric/Behavioral: Negative for hallucinations.      Allergies  Doxycycline; Mango flavor; Pyridium; and Tramadol  Home Medications   Prior to Admission medications   Medication Sig Start Date End Date Taking? Authorizing Provider  albuterol (PROVENTIL HFA;VENTOLIN HFA) 108 (90 BASE) MCG/ACT inhaler Inhale 2 puffs into the lungs every 6 (six) hours as needed for wheezing or shortness of breath. 07/16/14  Yes Lezlie Octave Black, NP  EPINEPHrine (EPIPEN 2-PAK) 0.3 mg/0.3 mL IJ SOAJ injection Inject 0.3 mLs (0.3 mg total) into the muscle once. 09/21/14  Yes Nat Christen, MD   BP 101/64 mmHg  Pulse 80  Temp(Src) 101.5 F (38.6 C) (Oral)  Resp 16  Ht 5\' 1"  (1.549 m)  Wt 178 lb (80.74 kg)  BMI 33.65 kg/m2  SpO2 98%  LMP 09/27/2014 Physical Exam  Constitutional: She is oriented to person, place, and time. She appears well-developed.  HENT:  Head: Normocephalic.  Eyes: Conjunctivae and EOM are normal. No scleral icterus.  Neck: Neck supple. No thyromegaly present.  Cardiovascular: Normal rate and regular rhythm.  Exam reveals no gallop and no friction rub.  No murmur heard. Pulmonary/Chest: No stridor. She has no wheezes. She has no rales. She exhibits no tenderness.  Abdominal: She exhibits no distension. There is no tenderness. There is no rebound.  Musculoskeletal: Normal range of motion. She exhibits no edema.  Lymphadenopathy:    She has no cervical adenopathy.  Neurological: She is oriented to person, place, and time. She exhibits normal muscle tone. Coordination normal.  Skin: Rash noted. No erythema.  Tender rash left thigh,  Consistent with celulitis  Psychiatric: She has a normal mood and affect. Her behavior is normal.    ED Course  Procedures (including critical  care time) Labs Review Labs Reviewed  CBC WITH DIFFERENTIAL/PLATELET - Abnormal; Notable for the following:    WBC 16.4 (*)    HCT 35.5 (*)    RDW 15.6 (*)    Neutro Abs 12.2 (*)    Monocytes Absolute 1.3 (*)    All other components within normal limits  COMPREHENSIVE METABOLIC PANEL - Abnormal; Notable for the following:    Sodium 131 (*)    Potassium 3.1 (*)    Chloride 98 (*)    Glucose, Bld 125 (*)    Calcium 8.1 (*)    Albumin 3.0 (*)    All other components within normal limits  CULTURE, BLOOD (ROUTINE X 2)  CULTURE, BLOOD (ROUTINE X 2)    Imaging Review Ct Femur Left W Contrast  10/01/2014   CLINICAL DATA:  Left anteromedial thigh swelling and erythema, acute onset. Initial encounter.  EXAM: CT OF THE LOWER LEFT EXTREMITY WITH CONTRAST  TECHNIQUE: Multidetector CT imaging of the left mid femur was performed according to the standard protocol following intravenous contrast administration.  COMPARISON:  None.  CONTRAST:  144mL OMNIPAQUE IOHEXOL 300 MG/ML  SOLN  FINDINGS: Diffuse soft tissue swelling is noted along the anteromedial aspect of the left thigh, in the scanned region, with associated skin thickening and soft tissue edema tracking to the level of the fascia. This surrounds the underlying superficial vasculature. The vasculature is difficult to fully assess due to surrounding edema.  There is no evidence of intramuscular involvement. No abscess is seen. Findings are compatible with acute cellulitis. The underlying os structures are grossly unremarkable in appearance. No soft tissue air is identified.  IMPRESSION: Diffuse soft tissue swelling along the anteromedial aspect of the left thigh, with associated skin thickening. Soft tissue edema tracks to the level of the fascia and surrounds the superficial vasculature. No evidence of abscess. Findings compatible with acute cellulitis.   Electronically Signed   By: Garald Balding M.D.   On: 10/01/2014 21:54     EKG  Interpretation None      MDM   Final diagnoses:  Pain  Cellulitis of left lower extremity    Admit cellulitis     Milton Ferguson, MD 10/01/14 2307

## 2014-10-01 NOTE — H&P (Signed)
[name] is an 44 y.o. female.    pcp cant recall name  Chief Complaint: cellulitis HPI: 44 yo female with Copd on home o2, PE, apparently c/o redness of the left thigh, apparently for about 3-4 days.  Slight warmth.  + fever.  Pt presented to ED because redness was increasing and in ED had CT scan that was negative for abscess.  Pt will be admitted for w/up of cellulitis.    Past Medical History  Diagnosis Date  . COPD (chronic obstructive pulmonary disease)   . Anxiety   . Panic attacks   . Pulmonary embolism   . Chronic back pain   . DDD (degenerative disc disease)   . Chronic knee pain   . Polysubstance abuse     cocaine, opiates, marijuana, amphetamines, "molly," crystal meth  . Hepatitis C infection 07/13/2014  . Nephrolithiasis     Past Surgical History  Procedure Laterality Date  . Cesarean section    . Nose surgery    . Tubes tied      Family History  Problem Relation Age of Onset  . Arthritis    . Lung disease    . Cancer    . Asthma    . COPD Mother   . Hypertension Father   . Heart attack Father   . Diabetes Father    Social History:  reports that she has been smoking Cigarettes.  She has a 12.5 pack-year smoking history. She has never used smokeless tobacco. She reports that she uses illicit drugs (IV, Cocaine, Marijuana, and Methamphetamines). She reports that she does not drink alcohol.  Allergies:  Allergies  Allergen Reactions  . Doxycycline Other (See Comments)    burns  . Mango Flavor Hives and Swelling  . Pyridium [Phenazopyridine Hcl] Other (See Comments)    Irritation to skin  . Tramadol Nausea And Vomiting   Medications reveiwed   Results for orders placed or performed during the hospital encounter of 10/01/14 (from the past 48 hour(s))  Blood culture (routine x 2)     Status: None (Preliminary result)   Collection Time: 10/01/14  8:00 PM  Result Value Ref Range   Specimen Description BLOOD LEFT FOREARM    Special Requests BOTTLES  DRAWN AEROBIC AND ANAEROBIC 6CC    Culture PENDING    Report Status PENDING   CBC with Differential/Platelet     Status: Abnormal   Collection Time: 10/01/14  8:25 PM  Result Value Ref Range   WBC 16.4 (H) 4.0 - 10.5 K/uL    Comment: RESULT REPEATED AND VERIFIED   RBC 4.18 3.87 - 5.11 MIL/uL   Hemoglobin 12.2 12.0 - 15.0 g/dL   HCT 35.5 (L) 36.0 - 46.0 %   MCV 84.9 78.0 - 100.0 fL   MCH 29.2 26.0 - 34.0 pg   MCHC 34.4 30.0 - 36.0 g/dL   RDW 15.6 (H) 11.5 - 15.5 %   Platelets 278 150 - 400 K/uL   Neutrophils Relative % 75 43 - 77 %   Neutro Abs 12.2 (H) 1.7 - 7.7 K/uL   Lymphocytes Relative 17 12 - 46 %   Lymphs Abs 2.7 0.7 - 4.0 K/uL   Monocytes Relative 8 3 - 12 %   Monocytes Absolute 1.3 (H) 0.1 - 1.0 K/uL   Eosinophils Relative 0 0 - 5 %   Eosinophils Absolute 0.1 0.0 - 0.7 K/uL   Basophils Relative 0 0 - 1 %   Basophils Absolute 0.1 0.0 -  0.1 K/uL   WBC Morphology ATYPICAL LYMPHOCYTES    RBC Morphology STOMATOCYTES   Comprehensive metabolic panel     Status: Abnormal   Collection Time: 10/01/14  8:25 PM  Result Value Ref Range   Sodium 131 (L) 135 - 145 mmol/L   Potassium 3.1 (L) 3.5 - 5.1 mmol/L   Chloride 98 (L) 101 - 111 mmol/L   CO2 23 22 - 32 mmol/L   Glucose, Bld 125 (H) 65 - 99 mg/dL   BUN 12 6 - 20 mg/dL   Creatinine, Ser 0.63 0.44 - 1.00 mg/dL   Calcium 8.1 (L) 8.9 - 10.3 mg/dL   Total Protein 7.6 6.5 - 8.1 g/dL   Albumin 3.0 (L) 3.5 - 5.0 g/dL   AST 19 15 - 41 U/L   ALT 21 14 - 54 U/L   Alkaline Phosphatase 54 38 - 126 U/L   Total Bilirubin 0.5 0.3 - 1.2 mg/dL   GFR calc non Af Amer >60 >60 mL/min   GFR calc Af Amer >60 >60 mL/min    Comment: (NOTE) The eGFR has been calculated using the CKD EPI equation. This calculation has not been validated in all clinical situations. eGFR's persistently <60 mL/min signify possible Chronic Kidney Disease.    Anion gap 10 5 - 15  Blood culture (routine x 2)     Status: None (Preliminary result)   Collection  Time: 10/01/14  8:25 PM  Result Value Ref Range   Specimen Description BLOOD LEFT HAND    Special Requests BOTTLES DRAWN AEROBIC AND ANAEROBIC 6CC    Culture PENDING    Report Status PENDING    Ct Femur Left W Contrast  10/01/2014   CLINICAL DATA:  Left anteromedial thigh swelling and erythema, acute onset. Initial encounter.  EXAM: CT OF THE LOWER LEFT EXTREMITY WITH CONTRAST  TECHNIQUE: Multidetector CT imaging of the left mid femur was performed according to the standard protocol following intravenous contrast administration.  COMPARISON:  None.  CONTRAST:  120m OMNIPAQUE IOHEXOL 300 MG/ML  SOLN  FINDINGS: Diffuse soft tissue swelling is noted along the anteromedial aspect of the left thigh, in the scanned region, with associated skin thickening and soft tissue edema tracking to the level of the fascia. This surrounds the underlying superficial vasculature. The vasculature is difficult to fully assess due to surrounding edema.  There is no evidence of intramuscular involvement. No abscess is seen. Findings are compatible with acute cellulitis. The underlying os structures are grossly unremarkable in appearance. No soft tissue air is identified.  IMPRESSION: Diffuse soft tissue swelling along the anteromedial aspect of the left thigh, with associated skin thickening. Soft tissue edema tracks to the level of the fascia and surrounds the superficial vasculature. No evidence of abscess. Findings compatible with acute cellulitis.   Electronically Signed   By: JGarald BaldingM.D.   On: 10/01/2014 21:54    Review of Systems  Constitutional: Positive for fever. Negative for chills, weight loss, malaise/fatigue and diaphoresis.  HENT: Negative.   Eyes: Negative.   Respiratory: Negative.   Cardiovascular: Negative.   Gastrointestinal: Negative.   Genitourinary: Negative.   Musculoskeletal: Negative.   Skin: Positive for rash. Negative for itching.  Neurological: Negative.  Negative for weakness.   Psychiatric/Behavioral: Negative.     Blood pressure 101/64, pulse 80, temperature 101.5 F (38.6 C), temperature source Oral, resp. rate 16, height 5' 1" (1.549 m), weight 80.74 kg (178 lb), last menstrual period 09/27/2014, SpO2 98 %. Physical Exam  Constitutional:  She is oriented to person, place, and time. She appears well-developed and well-nourished.  HENT:  Head: Normocephalic and atraumatic.  Mouth/Throat: No oropharyngeal exudate.  Eyes: Conjunctivae and EOM are normal. Pupils are equal, round, and reactive to light. No scleral icterus.  Neck: Normal range of motion. Neck supple. No JVD present. No tracheal deviation present. No thyromegaly present.  Cardiovascular: Normal rate and regular rhythm.  Exam reveals no gallop and no friction rub.   No murmur heard. Respiratory: Effort normal and breath sounds normal. No respiratory distress. She has no wheezes. She has no rales.  GI: Soft. Bowel sounds are normal. She exhibits no distension. There is no tenderness. There is no rebound and no guarding.  Musculoskeletal: Normal range of motion. She exhibits no edema or tenderness.  Neurological: She is alert and oriented to person, place, and time. She has normal reflexes. She displays normal reflexes. No cranial nerve deficit. She exhibits normal muscle tone. Coordination normal.  Skin: Skin is warm and dry. No rash noted. There is erythema. No pallor.  Redness about 8 cm oval on the medial left thigh, slight warmth, tenderness, no fluctuance  Psychiatric: She has a normal mood and affect. Her behavior is normal. Judgment and thought content normal.     Assessment/Plan Cellultiis Blood culture x2 ESR vanco iv, zosyn iv  If esr markedly elevated, then please check echo r/o endocarditis  Hyponatremia Check serum osm, tsh, cortisol, urine sodium, urine osm  Hypokalemia Check magnesium Replete Check cmp in am  Hyperglycemia Check hga1c  Polysubstance abuse counselled on  cessation x 58mnutes Along with smoking cessation x 559mutes nicontine patch  DVT prophyalxis:  Scd, lovenox    KIJani Gravel/21/2016, 10:41 PM

## 2014-10-01 NOTE — ED Notes (Signed)
Left upper leg swelling and redness.

## 2014-10-02 DIAGNOSIS — F1910 Other psychoactive substance abuse, uncomplicated: Secondary | ICD-10-CM

## 2014-10-02 DIAGNOSIS — E876 Hypokalemia: Secondary | ICD-10-CM

## 2014-10-02 DIAGNOSIS — L03116 Cellulitis of left lower limb: Principal | ICD-10-CM

## 2014-10-02 DIAGNOSIS — E871 Hypo-osmolality and hyponatremia: Secondary | ICD-10-CM

## 2014-10-02 LAB — CBC
HCT: 35.9 % — ABNORMAL LOW (ref 36.0–46.0)
Hemoglobin: 12.1 g/dL (ref 12.0–15.0)
MCH: 28.8 pg (ref 26.0–34.0)
MCHC: 33.7 g/dL (ref 30.0–36.0)
MCV: 85.5 fL (ref 78.0–100.0)
Platelets: 272 10*3/uL (ref 150–400)
RBC: 4.2 MIL/uL (ref 3.87–5.11)
RDW: 15.6 % — ABNORMAL HIGH (ref 11.5–15.5)
WBC: 15.4 10*3/uL — ABNORMAL HIGH (ref 4.0–10.5)

## 2014-10-02 LAB — COMPREHENSIVE METABOLIC PANEL
ALT: 20 U/L (ref 14–54)
AST: 14 U/L — ABNORMAL LOW (ref 15–41)
Albumin: 2.9 g/dL — ABNORMAL LOW (ref 3.5–5.0)
Alkaline Phosphatase: 55 U/L (ref 38–126)
Anion gap: 10 (ref 5–15)
BUN: 7 mg/dL (ref 6–20)
CO2: 27 mmol/L (ref 22–32)
Calcium: 8.3 mg/dL — ABNORMAL LOW (ref 8.9–10.3)
Chloride: 101 mmol/L (ref 101–111)
Creatinine, Ser: 0.67 mg/dL (ref 0.44–1.00)
GFR calc Af Amer: >60 mL/min (ref >60)
GFR calc non Af Amer: >60 mL/min (ref >60)
Glucose, Bld: 108 mg/dL — ABNORMAL HIGH (ref 65–99)
Potassium: 3.3 mmol/L — ABNORMAL LOW (ref 3.5–5.1)
Sodium: 138 mmol/L (ref 135–145)
Total Bilirubin: 0.7 mg/dL (ref 0.3–1.2)
Total Protein: 7.4 g/dL (ref 6.5–8.1)

## 2014-10-02 LAB — TSH: TSH: 0.943 u[IU]/mL (ref 0.350–4.500)

## 2014-10-02 LAB — OSMOLALITY, URINE: Osmolality, Ur: 160 mOsm/kg — ABNORMAL LOW (ref 390–1090)

## 2014-10-02 LAB — SEDIMENTATION RATE: Sed Rate: 76 mm/hr — ABNORMAL HIGH (ref 0–22)

## 2014-10-02 LAB — MAGNESIUM: Magnesium: 1.8 mg/dL (ref 1.7–2.4)

## 2014-10-02 LAB — OSMOLALITY: Osmolality: 282 mOsm/kg (ref 275–300)

## 2014-10-02 LAB — CORTISOL: Cortisol, Plasma: 15.4 ug/dL

## 2014-10-02 LAB — SODIUM, URINE, RANDOM: Sodium, Ur: 15 mmol/L

## 2014-10-02 MED ORDER — POTASSIUM CHLORIDE CRYS ER 20 MEQ PO TBCR
30.0000 meq | EXTENDED_RELEASE_TABLET | Freq: Two times a day (BID) | ORAL | Status: AC
  Administered 2014-10-02 (×2): 30 meq via ORAL
  Filled 2014-10-02 (×4): qty 1

## 2014-10-02 MED ORDER — VANCOMYCIN HCL IN DEXTROSE 1-5 GM/200ML-% IV SOLN
INTRAVENOUS | Status: AC
  Filled 2014-10-02: qty 200

## 2014-10-02 MED ORDER — MORPHINE SULFATE 2 MG/ML IJ SOLN
2.0000 mg | INTRAMUSCULAR | Status: DC | PRN
  Administered 2014-10-02: 2 mg via INTRAVENOUS
  Filled 2014-10-02: qty 1

## 2014-10-02 MED ORDER — VANCOMYCIN HCL IN DEXTROSE 1-5 GM/200ML-% IV SOLN
1000.0000 mg | Freq: Two times a day (BID) | INTRAVENOUS | Status: DC
  Administered 2014-10-02 – 2014-10-06 (×8): 1000 mg via INTRAVENOUS
  Filled 2014-10-02 (×10): qty 200

## 2014-10-02 MED ORDER — VANCOMYCIN HCL IN DEXTROSE 1-5 GM/200ML-% IV SOLN
1000.0000 mg | Freq: Once | INTRAVENOUS | Status: AC
  Administered 2014-10-02: 1000 mg via INTRAVENOUS
  Filled 2014-10-02 (×3): qty 200

## 2014-10-02 MED ORDER — OXYCODONE HCL 5 MG PO TABS
10.0000 mg | ORAL_TABLET | ORAL | Status: DC | PRN
  Administered 2014-10-02 – 2014-10-06 (×13): 10 mg via ORAL
  Filled 2014-10-02 (×13): qty 2

## 2014-10-02 MED ORDER — MORPHINE SULFATE 10 MG/ML IJ SOLN
5.0000 mg | INTRAMUSCULAR | Status: DC | PRN
  Administered 2014-10-02 – 2014-10-06 (×16): 5 mg via INTRAVENOUS
  Filled 2014-10-02 (×18): qty 1

## 2014-10-02 MED ORDER — PIPERACILLIN-TAZOBACTAM 3.375 G IVPB
INTRAVENOUS | Status: AC
  Filled 2014-10-02: qty 100

## 2014-10-02 MED ORDER — HYDROMORPHONE HCL 1 MG/ML IJ SOLN
INTRAMUSCULAR | Status: AC
  Administered 2014-10-02: 0.5 mg
  Filled 2014-10-02: qty 1

## 2014-10-02 MED ORDER — OXYCODONE HCL 5 MG PO TABS
5.0000 mg | ORAL_TABLET | ORAL | Status: DC | PRN
  Administered 2014-10-02 (×2): 10 mg via ORAL
  Filled 2014-10-02 (×2): qty 2

## 2014-10-02 MED ORDER — HYDROMORPHONE HCL 1 MG/ML IJ SOLN
0.5000 mg | Freq: Once | INTRAMUSCULAR | Status: AC
  Administered 2014-10-02: 0.5 mg via INTRAVENOUS

## 2014-10-02 NOTE — Progress Notes (Signed)
ANTIBIOTIC CONSULT NOTE-Preliminary  Pharmacy Consult for vancomycin Indication: cellulitis  Allergies  Allergen Reactions  . Doxycycline Other (See Comments)    burns  . Mango Flavor Hives and Swelling  . Pyridium [Phenazopyridine Hcl] Other (See Comments)    Irritation to skin  . Tramadol Nausea And Vomiting    Patient Measurements: Height: 5\' 1"  (154.9 cm) Weight: 178 lb (80.74 kg) IBW/kg (Calculated) : 47.8   Vital Signs: Temp: 98.3 F (36.8 C) (06/22 0000) Temp Source: Oral (06/22 0000) BP: 106/67 mmHg (06/22 0000) Pulse Rate: 80 (06/22 0000)  Labs:  Recent Labs  10/01/14 2025  WBC 16.4*  HGB 12.2  PLT 278  CREATININE 0.63    Estimated Creatinine Clearance: 88.2 mL/min (by C-G formula based on Cr of 0.63).  No results for input(s): VANCOTROUGH, VANCOPEAK, VANCORANDOM, GENTTROUGH, GENTPEAK, GENTRANDOM, TOBRATROUGH, TOBRAPEAK, TOBRARND, AMIKACINPEAK, AMIKACINTROU, AMIKACIN in the last 72 hours.   Microbiology: Recent Results (from the past 720 hour(s))  Blood culture (routine x 2)     Status: None (Preliminary result)   Collection Time: 10/01/14  8:00 PM  Result Value Ref Range Status   Specimen Description BLOOD LEFT FOREARM  Final   Special Requests BOTTLES DRAWN AEROBIC AND ANAEROBIC 6CC  Final   Culture PENDING  Incomplete   Report Status PENDING  Incomplete  Blood culture (routine x 2)     Status: None (Preliminary result)   Collection Time: 10/01/14  8:25 PM  Result Value Ref Range Status   Specimen Description BLOOD LEFT HAND  Final   Special Requests BOTTLES DRAWN AEROBIC AND ANAEROBIC 6CC  Final   Culture PENDING  Incomplete   Report Status PENDING  Incomplete    Medical History: Past Medical History  Diagnosis Date  . COPD (chronic obstructive pulmonary disease)   . Anxiety   . Panic attacks   . Pulmonary embolism   . Chronic back pain   . DDD (degenerative disc disease)   . Chronic knee pain   . Polysubstance abuse     cocaine,  opiates, marijuana, amphetamines, "molly," crystal meth  . Hepatitis C infection 07/13/2014  . Nephrolithiasis     Medications:  Scheduled:  . enoxaparin (LOVENOX) injection  40 mg Subcutaneous Q24H  . piperacillin-tazobactam (ZOSYN)  IV  3.375 g Intravenous Q8H  . vancomycin  1,000 mg Intravenous Once    Assessment: 44 yo presented with rash, redness on L thigh. Area warm and pt has fever.   Goal of Therapy:  Vancomycin trough level 10-15 mcg/ml  Plan:  Preliminary review of pertinent patient information completed.  Protocol will be initiated with a one-time dose(s) of vancomycin 1000 mg at 0900, 12 hours after 1 gram given in ED.  Forestine Na clinical pharmacist will complete review during morning rounds to assess patient and finalize treatment regimen.   Grosser, Magdalene Molly, [place] 10/02/2014,1:12 AM

## 2014-10-02 NOTE — Progress Notes (Signed)
[facility] PROGRESS NOTE  [name] [id number] DOB: 1971-02-22 DOA: 10/01/2014 PCP: [name] Patient    Code Status: Full code Family Communication: Family not available, discussed with patient. Disposition Plan: Discharge when clinically appropriate.   Consultants:  None  Procedures:  None  Antibiotics:  Zosyn 6/22>  Vancomycin 6/21>  HPI/Subjective: The patient complains of 10 over 10 left leg pain over the cellulitic area with some radiation to her left groin. She denies chest pain, shortness of breath, nausea, vomiting, or subjective fever/chills. She denies IV injection of illicit drugs into her left eye.  Objective: Filed Vitals:   10/02/14 0438  BP: 116/73  Pulse: 73  Temp: 98.3 F (36.8 C)  Resp: 20   MAXIMUM TEMPERATURE 102.5. T currently 98.3.  Intake/Output Summary (Last 24 hours) at 10/02/14 1115 Last data filed at 10/02/14 0951  Gross per 24 hour  Intake      0 ml  Output   1100 ml  Net  -1100 ml   Filed Weights   10/01/14 1801 10/02/14 0000  Weight: 80.74 kg (178 lb) 79.969 kg (176 lb 4.8 oz)    Exam:   General:  Alert 44 year old Caucasian woman in no acute distress.  Cardiovascular: S1, S2, with a soft systolic murmur.  Respiratory: Clear to auscultation bilaterally.  Abdomen: Positive bowel sounds, soft, nontender, nondistended.  Musculoskeletal/extremities: Large indurated erythematous area on the anterior surface of her left thigh; moderately tender; no drainage. Notable left lateral thigh colored tattoo. No pedal edema. No popliteal nodes palpated.  Neurologic/psychiatric: She has a flat affect, but she is alert and oriented 3. Her speech is clear. She denies suicidal ideation or anxiousness.   Data Reviewed: Basic Metabolic Panel:  Recent Labs Lab 10/01/14 2025 10/02/14 0449  NA 131* 138  K 3.1* 3.3*  CL 98* 101  CO2 23 27  GLUCOSE 125* 108*  BUN 12 7  CREATININE 0.63 0.67  CALCIUM 8.1* 8.3*  MG 1.8   --    Liver Function Tests:  Recent Labs Lab 10/01/14 2025 10/02/14 0449  AST 19 14*  ALT 21 20  ALKPHOS 54 55  BILITOT 0.5 0.7  PROT 7.6 7.4  ALBUMIN 3.0* 2.9*   No results for input(s): LIPASE, AMYLASE in the last 168 hours. No results for input(s): AMMONIA in the last 168 hours. CBC:  Recent Labs Lab 10/01/14 2025 10/02/14 0449  WBC 16.4* 15.4*  NEUTROABS 12.2*  --   HGB 12.2 12.1  HCT 35.5* 35.9*  MCV 84.9 85.5  PLT 278 272   Cardiac Enzymes: No results for input(s): CKTOTAL, CKMB, CKMBINDEX, TROPONINI in the last 168 hours. BNP (last 3 results) No results for input(s): BNP in the last 8760 hours.  ProBNP (last 3 results) No results for input(s): PROBNP in the last 8760 hours.  CBG: No results for input(s): GLUCAP in the last 168 hours.  Recent Results (from the past 240 hour(s))  Blood culture (routine x 2)     Status: None (Preliminary result)   Collection Time: 10/01/14  8:00 PM  Result Value Ref Range Status   Specimen Description BLOOD LEFT FOREARM  Final   Special Requests BOTTLES DRAWN AEROBIC AND ANAEROBIC 6CC  Final   Culture PENDING  Incomplete   Report Status PENDING  Incomplete  Blood culture (routine x 2)     Status: None (Preliminary result)   Collection Time: 10/01/14  8:25 PM  Result Value Ref Range Status   Specimen Description BLOOD LEFT  HAND  Final   Special Requests BOTTLES DRAWN AEROBIC AND ANAEROBIC 6CC  Final   Culture NO GROWTH < 24 HOURS  Final   Report Status PENDING  Incomplete     Studies: Ct Femur Left W Contrast  10/01/2014   CLINICAL DATA:  Left anteromedial thigh swelling and erythema, acute onset. Initial encounter.  EXAM: CT OF THE LOWER LEFT EXTREMITY WITH CONTRAST  TECHNIQUE: Multidetector CT imaging of the left mid femur was performed according to the standard protocol following intravenous contrast administration.  COMPARISON:  None.  CONTRAST:  119mL OMNIPAQUE IOHEXOL 300 MG/ML  SOLN  FINDINGS: Diffuse soft tissue  swelling is noted along the anteromedial aspect of the left thigh, in the scanned region, with associated skin thickening and soft tissue edema tracking to the level of the fascia. This surrounds the underlying superficial vasculature. The vasculature is difficult to fully assess due to surrounding edema.  There is no evidence of intramuscular involvement. No abscess is seen. Findings are compatible with acute cellulitis. The underlying os structures are grossly unremarkable in appearance. No soft tissue air is identified.  IMPRESSION: Diffuse soft tissue swelling along the anteromedial aspect of the left thigh, with associated skin thickening. Soft tissue edema tracks to the level of the fascia and surrounds the superficial vasculature. No evidence of abscess. Findings compatible with acute cellulitis.   Electronically Signed   By: Garald Balding M.D.   On: 10/01/2014 21:54    Scheduled Meds: . enoxaparin (LOVENOX) injection  40 mg Subcutaneous Q24H  . piperacillin-tazobactam (ZOSYN)  IV  3.375 g Intravenous Q8H  . potassium chloride  30 mEq Oral BID  . vancomycin  1,000 mg Intravenous Once  . vancomycin  1,000 mg Intravenous Q12H   Continuous Infusions: . sodium chloride     Assessment and plan:  Principal Problem:   Cellulitis of leg, left Active Problems:   Polysubstance abuse   Hypokalemia   Hyponatremia   Opioid dependence   IV drug abuse   1. Left lower extremity cellulitis. The patient denies illicit IV drug use into her left thigh, but she had a colored tattoo placed on her left lateral thigh last week which may be the etiology of her cellulitis. On admission, she was febrile with a temperature of 102.5. And a white blood cell count of 16.4. CT of her left leg revealed soft tissue swelling/edema, but no evidence of abscess. Her sedimentation rate was modestly to moderately elevated. She was started on vancomycin. I have added Zosyn for broader coverage, given the recent  tattoo. Her fever has resolved and her white blood cell count has trended down some. She is not toxic appearing at this point.  Left leg pain secondary to cellulitis. Although the patient has a history of polysubstance abuse and opiate dependence, I thought it reasonable to start small doses of morphine and oxycodone as needed for pain.  Hyponatremia. Patient's serum sodium was 131 on admission. This was likely from hypovolemia. With IV fluid hydration, her serum sodium has normalized.  Hypokalemia. Her serum potassium was 3.1 on admission. She was given potassium chloride with some improvement. Her magnesium level was within normal limits. We'll continue to supplement accordingly.  Polysubstance abuse/IV drug use. The patient was advised to stop. Will order social work consult for outpatient services that can help her. She was also advised to stop smoking.    Time spent: 35 minutes    [facility] Pager [phone]. If 7PM-7AM, please contact night-coverage at  [email or web address], password [facility] 10/02/2014, 11:15 AM  LOS: 1 day

## 2014-10-02 NOTE — Clinical Social Work Note (Signed)
CSW received consult for drug use. Attempted to meet with pt, but pt eating and visiting. She requests that CSW return at another time.   Benay Pike, [place]

## 2014-10-02 NOTE — Care Management Note (Signed)
Case Management Note  Patient Details  Name: [name] MRN: [id number] Date of Birth: 1970/07/22  Expected Discharge Date:                  Expected Discharge Plan:  Home/Self Care  In-House Referral:  NA  Discharge planning Services  CM Consult, NA  Post Acute Care Choice:  NA Choice offered to:  NA  DME Arranged:    DME Agency:     HH Arranged:    HH Agency:     Status of Service:  Completed, signed off  Medicare Important Message Given:    Date Medicare IM Given:    Medicare IM give by:    Date Additional Medicare IM Given:    Additional Medicare Important Message give by:     If discussed at [place] of Stay Meetings, dates discussed:    Additional Comments: Pt is from home, lives with children. Pt has neb machine but no home O2. Pt independent with ADL's. Pt goes to the [facility] for PCP care and will need follow up appointment made prior to discharge. Pt admitted to hospital for cellulitis. Pt plans to return home with self care at discharge. No CM needs anticipated? Sherald Barge, RN 10/02/2014, 10:46 AM

## 2014-10-03 DIAGNOSIS — J449 Chronic obstructive pulmonary disease, unspecified: Secondary | ICD-10-CM | POA: Diagnosis present

## 2014-10-03 LAB — HEPATITIS PANEL, ACUTE
HCV Ab: >11 s/co ratio — ABNORMAL HIGH (ref 0.0–0.9)
Hep A IgM: NEGATIVE
Hep B C IgM: NEGATIVE
Hepatitis B Surface Ag: NEGATIVE

## 2014-10-03 LAB — BASIC METABOLIC PANEL
Anion gap: 8 (ref 5–15)
BUN: 9 mg/dL (ref 6–20)
CO2: 25 mmol/L (ref 22–32)
Calcium: 8.1 mg/dL — ABNORMAL LOW (ref 8.9–10.3)
Chloride: 104 mmol/L (ref 101–111)
Creatinine, Ser: 0.7 mg/dL (ref 0.44–1.00)
GFR calc Af Amer: >60 mL/min (ref >60)
GFR calc non Af Amer: >60 mL/min (ref >60)
Glucose, Bld: 118 mg/dL — ABNORMAL HIGH (ref 65–99)
Potassium: 3.9 mmol/L (ref 3.5–5.1)
Sodium: 137 mmol/L (ref 135–145)

## 2014-10-03 LAB — CBC
HCT: 32.3 % — ABNORMAL LOW (ref 36.0–46.0)
Hemoglobin: 10.8 g/dL — ABNORMAL LOW (ref 12.0–15.0)
MCH: 28.5 pg (ref 26.0–34.0)
MCHC: 33.4 g/dL (ref 30.0–36.0)
MCV: 85.2 fL (ref 78.0–100.0)
Platelets: 278 10*3/uL (ref 150–400)
RBC: 3.79 MIL/uL — ABNORMAL LOW (ref 3.87–5.11)
RDW: 15.9 % — ABNORMAL HIGH (ref 11.5–15.5)
WBC: 12.6 10*3/uL — ABNORMAL HIGH (ref 4.0–10.5)

## 2014-10-03 LAB — HEMOGLOBIN A1C
Hgb A1c MFr Bld: 5.8 % — ABNORMAL HIGH (ref 4.8–5.6)
Mean Plasma Glucose: 120 mg/dL

## 2014-10-03 MED ORDER — NICOTINE 14 MG/24HR TD PT24
14.0000 mg | MEDICATED_PATCH | Freq: Every day | TRANSDERMAL | Status: DC
  Administered 2014-10-03 – 2014-10-06 (×4): 14 mg via TRANSDERMAL
  Filled 2014-10-03 (×4): qty 1

## 2014-10-03 MED ORDER — ALBUTEROL SULFATE (2.5 MG/3ML) 0.083% IN NEBU
2.5000 mg | INHALATION_SOLUTION | Freq: Four times a day (QID) | RESPIRATORY_TRACT | Status: DC
  Administered 2014-10-03: 2.5 mg via RESPIRATORY_TRACT
  Filled 2014-10-03: qty 3

## 2014-10-03 NOTE — Clinical Social Work Note (Signed)
Clinical Social Work Assessment  Patient Details  Name: [name] MRN: [id number] Date of Birth: 11-Oct-1970  Date of referral:  10/03/14               Reason for consult:  Substance Use/ETOH Abuse                Permission sought to share information with:    Permission granted to share information::     Name::        Agency::     Relationship::     Contact Information:     Housing/Transportation Living arrangements for the past 2 months:  Mobile Home Source of Information:  Patient Patient Interpreter Needed:  None Criminal Activity/Legal Involvement Pertinent to Current Situation/Hospitalization:  No - Comment as needed Significant Relationships:  Adult Children, Parents Lives with:  Adult Children Do you feel safe going back to the place where you live?  Yes Need for family participation in patient care:  No (Coment)  Care giving concerns:  Pt is independent with ADLs.    Social Worker assessment / plan:  CSW met with pt at bedside. Pt alert and oriented and reports she lives with her two adult children in a trailer next to her parents. She describes her mother as her best support. Pt is on disability which she indicates is for Bipolar/Schizophrenia and back issues. Pt admitted with left leg cellulitis, but is eager to d/c home when ready.   Pt reports she is not being seen by therapist or psychiatrist for mental health and is not on any medication for this. She agrees that it would be beneficial to start, but was concerned about transportation. CSW provided contact information for [facility]. She indicates that she was seen at [facility], but refuses to return because she does not like the psychiatrist there. List of other outpatient providers given to pt. She states she has several things to take care of first, but then would be interested in regular follow up.  Pt open to discussing substance use with CSW. She denies alcohol use. Pt started using drugs at age 48, primarily heroin  and cocaine. She describes this use as "all day, every day" for many years. Since last hospitalization, pt reports she has cut back considerably to about 3x a week and said she has been completely sober for the past 2 weeks. She had felt inpatient treatment was best for her several months ago, but was told she qualified for outpatient only and did not follow up on this. Although in the past few months she would feel drug use was a problem for her, at this time she feels she is in a much better place. She states, "I won't go out looking for it anymore." Her mother does not approve of her drug use and is very supportive. Pt is under the impression that her dad is using, but does not know for sure. Pt reports improvement in her life satisfaction recently and feels that outpatient treatment may be helpful as she tries to remain sober. CSW provided outpatient resource list. No other needs reported. CSW will sign off, but can be reconsulted if needed.   Employment status:  Disabled (Comment on whether or not currently receiving Disability) Insurance information:  Medicare PT Recommendations:  Not assessed at this time Information / Referral to community resources:  Outpatient Substance Abuse Treatment Options  Patient/Family's Response to care:  Pt requesting outpatient substance abuse referrals which CSW provided.   Patient/Family's Understanding of  and Emotional Response to Diagnosis, Current Treatment, and Prognosis:  Pt indicates she has been counseled many times about the effects of drugs on her health. She states she is open to "anything that will better me" and reports understanding that remaining sober is certainly part of this. Pt said she plans to follow up on outpatient resources provided for both mental health and substance use.   Emotional Assessment Appearance:  Appears older than stated age Attitude/Demeanor/Rapport:  Other (cooperative) Affect (typically observed):  Appropriate Orientation:   Oriented to Self, Oriented to Place, Oriented to  Time, Oriented to Situation Alcohol / Substance use:  Illicit Drugs Psych involvement (Current and /or in the community):     Discharge Needs  Concerns to be addressed:  Substance Abuse Concerns Readmission within the last 30 days:  No Current discharge risk:  Substance Abuse Barriers to Discharge:  Continued Medical Work up   [employer], [employer], [place] 10/03/2014, 8:57 AM [phone]

## 2014-10-03 NOTE — Progress Notes (Signed)
Pt removed urine measuring cup from the toilet. She states she was using the bedside commode and now wants to use the bathroom. Pt used the toilet two times in addition to the amount found in the urine hat. Educated pt on the importance of measuring her urine. Will continue to reinforce.

## 2014-10-03 NOTE — Progress Notes (Addendum)
[facility] PROGRESS NOTE  [name] [id number] DOB: [birth year] DOA: 10/01/2014 PCP: [name] Patient    Code Status: Full code Family Communication: Family not available, discussed with patient. Disposition Plan: Discharge when clinically appropriate.   Consultants:  None  Procedures:  None  Antibiotics:  Zosyn 6/22>  Vancomycin 6/21>  HPI/Subjective: The patient still has some left thigh pain, but opiate medications ordered have helped. She denies subjective fever, chills, nausea, or vomiting.  Objective: Filed Vitals:   10/03/14 0500  BP: 118/61  Pulse: 66  Temp: 98.4 F (36.9 C)  Resp: 16   MAXIMUM TEMPERATURE 100.5. Current temperature 98.4.  Intake/Output Summary (Last 24 hours) at 10/03/14 0837 Last data filed at 10/03/14 0600  Gross per 24 hour  Intake   1310 ml  Output   1850 ml  Net   -540 ml   Filed Weights   10/01/14 1801 10/02/14 0000 10/03/14 0646  Weight: 80.74 kg (178 lb) 79.969 kg (176 lb 4.8 oz) 81.149 kg (178 lb 14.4 oz)    Exam:   General:  Alert 44 year old Caucasian woman in no acute distress.  Cardiovascular: S1, S2, with a soft systolic murmur.  Respiratory: Few scattered rhonchus wheezes; breathing unlabored.  Abdomen: Positive bowel sounds, soft, nontender, nondistended.  Musculoskeletal/extremities: Large indurated erythematous area on the anterior surface of her left thigh; mildly-moderately tender; no drainage. Notable left lateral thigh colored tattoo. Shotty inguinal nodes palpated with some tenderness. No pedal edema. No enlarged popliteal nodes palpated.  Neurologic/psychiatric: She has a flat affect, but she is alert and oriented 3. Her speech is clear.   Data Reviewed: Basic Metabolic Panel:  Recent Labs Lab 10/01/14 2025 10/02/14 0449 10/03/14 0500  NA 131* 138 137  K 3.1* 3.3* 3.9  CL 98* 101 104  CO2 23 27 25   GLUCOSE 125* 108* 118*  BUN 12 7 9   CREATININE 0.63 0.67 0.70  CALCIUM  8.1* 8.3* 8.1*  MG 1.8  --   --    Liver Function Tests:  Recent Labs Lab 10/01/14 2025 10/02/14 0449  AST 19 14*  ALT 21 20  ALKPHOS 54 55  BILITOT 0.5 0.7  PROT 7.6 7.4  ALBUMIN 3.0* 2.9*   No results for input(s): LIPASE, AMYLASE in the last 168 hours. No results for input(s): AMMONIA in the last 168 hours. CBC:  Recent Labs Lab 10/01/14 2025 10/02/14 0449 10/03/14 0500  WBC 16.4* 15.4* 12.6*  NEUTROABS 12.2*  --   --   HGB 12.2 12.1 10.8*  HCT 35.5* 35.9* 32.3*  MCV 84.9 85.5 85.2  PLT 278 272 278   Cardiac Enzymes: No results for input(s): CKTOTAL, CKMB, CKMBINDEX, TROPONINI in the last 168 hours. BNP (last 3 results) No results for input(s): BNP in the last 8760 hours.  ProBNP (last 3 results) No results for input(s): PROBNP in the last 8760 hours.  CBG: No results for input(s): GLUCAP in the last 168 hours.  Recent Results (from the past 240 hour(s))  Blood culture (routine x 2)     Status: None (Preliminary result)   Collection Time: 10/01/14  8:00 PM  Result Value Ref Range Status   Specimen Description BLOOD LEFT FOREARM  Final   Special Requests BOTTLES DRAWN AEROBIC AND ANAEROBIC 6CC  Final   Culture NO GROWTH < 24 HOURS  Final   Report Status PENDING  Incomplete  Blood culture (routine x 2)     Status: None (Preliminary result)   Collection Time: 10/01/14  8:25 PM  Result Value Ref Range Status   Specimen Description BLOOD LEFT HAND DRAWN BY RN  Final   Special Requests BOTTLES DRAWN AEROBIC AND ANAEROBIC 6CC  Final   Culture NO GROWTH < 24 HOURS  Final   Report Status PENDING  Incomplete     Studies: Ct Femur Left W Contrast  10/01/2014   CLINICAL DATA:  Left anteromedial thigh swelling and erythema, acute onset. Initial encounter.  EXAM: CT OF THE LOWER LEFT EXTREMITY WITH CONTRAST  TECHNIQUE: Multidetector CT imaging of the left mid femur was performed according to the standard protocol following intravenous contrast administration.   COMPARISON:  None.  CONTRAST:  191mL OMNIPAQUE IOHEXOL 300 MG/ML  SOLN  FINDINGS: Diffuse soft tissue swelling is noted along the anteromedial aspect of the left thigh, in the scanned region, with associated skin thickening and soft tissue edema tracking to the level of the fascia. This surrounds the underlying superficial vasculature. The vasculature is difficult to fully assess due to surrounding edema.  There is no evidence of intramuscular involvement. No abscess is seen. Findings are compatible with acute cellulitis. The underlying os structures are grossly unremarkable in appearance. No soft tissue air is identified.  IMPRESSION: Diffuse soft tissue swelling along the anteromedial aspect of the left thigh, with associated skin thickening. Soft tissue edema tracks to the level of the fascia and surrounds the superficial vasculature. No evidence of abscess. Findings compatible with acute cellulitis.   Electronically Signed   By: Garald Balding M.D.   On: 10/01/2014 21:54    Scheduled Meds: . albuterol  2.5 mg Nebulization Q6H  . enoxaparin (LOVENOX) injection  40 mg Subcutaneous Q24H  . nicotine  14 mg Transdermal Daily  . piperacillin-tazobactam (ZOSYN)  IV  3.375 g Intravenous Q8H  . vancomycin  1,000 mg Intravenous Q12H   Continuous Infusions:   Assessment and plan:  Principal Problem:   Cellulitis of leg, left Active Problems:   Polysubstance abuse   Hypokalemia   Hyponatremia   Opioid dependence   IV drug abuse   1. Left lower extremity cellulitis. The patient denies illicit IV drug use into her left thigh, but she had a colored tattoo placed on her left lateral thigh last week which may be the etiology of her cellulitis. On admission, she was febrile with a temperature of 102.5. And a white blood cell count of 16.4. CT of her left leg revealed soft tissue swelling/edema, but no evidence of abscess. Her sedimentation rate was modestly to moderately elevated. She was started on  vancomycin. Subsequently, Zosyn for broader coverage, given the recent tattoo. She had a mild low-grade fever overnight, but overall, her fever curve has improved. Her white blood cell count is trending downward. Her blood cultures are negative to date. -We will add warm compresses to help with induration and pain; otherwise continue current management.  Left leg pain secondary to cellulitis. Although the patient has a history of polysubstance abuse and opiate dependence, she was started on IV morphine and IV hydrocodone as needed.  Hyponatremia. Patient's serum sodium was 131 on admission. This was likely from hypovolemia. With IV fluid hydration, her serum sodium has normalized. -Her TSH and cortisol level were within normal limits.  Hypokalemia. Her serum potassium was 3.1 on admission. She was given potassium chloride with improvement. Her magnesium level was within normal limits. We'll continue to supplement accordingly.  COPD. The patient has a few wheezes on exam, but doubt COPD exacerbation. Albuterol nebulizer was ordered.  She was encouraged to stop smoking.  Polysubstance abuse/IV drug use. The patient was advised to stop. Clinical social worker consulted and provided patient with outpatient services that can help her. She was also advised to stop smoking. Nicotine patch ordered as requested. Will order tobacco cessation counseling.    Time spent: 25 minutes    [facility] Pager [phone]. If 7PM-7AM, please contact night-coverage at [email or web address], password [facility] 10/03/2014, 8:37 AM  LOS: 2 days

## 2014-10-04 DIAGNOSIS — J449 Chronic obstructive pulmonary disease, unspecified: Secondary | ICD-10-CM

## 2014-10-04 LAB — CBC
HCT: 31.8 % — ABNORMAL LOW (ref 36.0–46.0)
Hemoglobin: 10.4 g/dL — ABNORMAL LOW (ref 12.0–15.0)
MCH: 28.2 pg (ref 26.0–34.0)
MCHC: 32.7 g/dL (ref 30.0–36.0)
MCV: 86.2 fL (ref 78.0–100.0)
Platelets: 336 10*3/uL (ref 150–400)
RBC: 3.69 MIL/uL — ABNORMAL LOW (ref 3.87–5.11)
RDW: 15.9 % — ABNORMAL HIGH (ref 11.5–15.5)
WBC: 9.3 10*3/uL (ref 4.0–10.5)

## 2014-10-04 NOTE — Care Management Note (Signed)
Case Management Note  Patient Details  Name: [name] MRN: [id number] Date of Birth: 06-Sep-1970  Expected Discharge Date:                  Expected Discharge Plan:  Home/Self Care  In-House Referral:  NA  Discharge planning Services  CM Consult, NA  Post Acute Care Choice:  NA Choice offered to:  NA  DME Arranged:    DME Agency:     HH Arranged:    HH Agency:     Status of Service:  Completed, signed off  Medicare Important Message Given:  Yes Date Medicare IM Given:  10/04/14 Medicare IM give by:  Jolene Provost, RN, MSN, CM  Date Additional Medicare IM Given:    Additional Medicare Important Message give by:     If discussed at [place] of Stay Meetings, dates discussed:    Additional Comments: Possible DC over weekend. No CM needs.  Sherald Barge, RN 10/04/2014, 9:31 AM

## 2014-10-04 NOTE — Progress Notes (Signed)
[facility] PROGRESS NOTE  [name] HFW:[id number] DOB: 1970-05-28 DOA: 10/01/2014 PCP: [name] Patient    Code Status: Full code Family Communication: Family not available, discussed with patient. Disposition Plan: Discharge when clinically appropriate.   Consultants:  None  Procedures:  None  Antibiotics:  Zosyn 6/22>  Vancomycin 6/21>  HPI/Subjective: The patient still has some left thigh pain, but opiate medications ordered have helped. She denies subjective fever, chills, nausea, or vomiting.  Objective: Filed Vitals:   10/04/14 0605  BP: 103/61  Pulse: 62  Temp: 98.1 F (36.7 C)  Resp: 18   MAXIMUM TEMPERATURE 99.9. Current interpreter 98.1.  Intake/Output Summary (Last 24 hours) at 10/04/14 1302 Last data filed at 10/04/14 1238  Gross per 24 hour  Intake   1750 ml  Output    850 ml  Net    900 ml   Filed Weights   10/02/14 0000 10/03/14 0646 10/04/14 0605  Weight: 79.969 kg (176 lb 4.8 oz) 81.149 kg (178 lb 14.4 oz) 80.922 kg (178 lb 6.4 oz)    Exam:   General:  Alert 44 year old Caucasian woman in no acute distress.  Cardiovascular: S1, S2, with a soft systolic murmur.  Respiratory: Few scattered  wheezes; breathing unlabored.  Abdomen: Positive bowel sounds, soft, nontender, nondistended.  Musculoskeletal/extremities: Large indurated erythematous area on the anterior surface of her left thigh which is subsiding; mildly tender; no drainage. Notable left lateral thigh colored tattoo. Shotty inguinal nodes palpated with some tenderness. No pedal edema. No enlarged popliteal nodes palpated.  Neurologic/psychiatric: She has a flat affect, but she is alert and oriented 3. Her speech is clear.   Data Reviewed: Basic Metabolic Panel:  Recent Labs Lab 10/01/14 2025 10/02/14 0449 10/03/14 0500  NA 131* 138 137  K 3.1* 3.3* 3.9  CL 98* 101 104  CO2 23 27 25   GLUCOSE 125* 108* 118*  BUN 12 7 9   CREATININE 0.63 0.67 0.70   CALCIUM 8.1* 8.3* 8.1*  MG 1.8  --   --    Liver Function Tests:  Recent Labs Lab 10/01/14 2025 10/02/14 0449  AST 19 14*  ALT 21 20  ALKPHOS 54 55  BILITOT 0.5 0.7  PROT 7.6 7.4  ALBUMIN 3.0* 2.9*   No results for input(s): LIPASE, AMYLASE in the last 168 hours. No results for input(s): AMMONIA in the last 168 hours. CBC:  Recent Labs Lab 10/01/14 2025 10/02/14 0449 10/03/14 0500 10/04/14 0528  WBC 16.4* 15.4* 12.6* 9.3  NEUTROABS 12.2*  --   --   --   HGB 12.2 12.1 10.8* 10.4*  HCT 35.5* 35.9* 32.3* 31.8*  MCV 84.9 85.5 85.2 86.2  PLT 278 272 278 336   Cardiac Enzymes: No results for input(s): CKTOTAL, CKMB, CKMBINDEX, TROPONINI in the last 168 hours. BNP (last 3 results) No results for input(s): BNP in the last 8760 hours.  ProBNP (last 3 results) No results for input(s): PROBNP in the last 8760 hours.  CBG: No results for input(s): GLUCAP in the last 168 hours.  Recent Results (from the past 240 hour(s))  Blood culture (routine x 2)     Status: None (Preliminary result)   Collection Time: 10/01/14  8:00 PM  Result Value Ref Range Status   Specimen Description BLOOD LEFT FOREARM  Final   Special Requests BOTTLES DRAWN AEROBIC AND ANAEROBIC 6CC  Final   Culture NO GROWTH 2 DAYS  Final   Report Status PENDING  Incomplete  Blood culture (  routine x 2)     Status: None (Preliminary result)   Collection Time: 10/01/14  8:25 PM  Result Value Ref Range Status   Specimen Description BLOOD LEFT HAND DRAWN BY RN  Final   Special Requests BOTTLES DRAWN AEROBIC AND ANAEROBIC 6CC  Final   Culture NO GROWTH 2 DAYS  Final   Report Status PENDING  Incomplete     Studies: No results found.  Scheduled Meds: . enoxaparin (LOVENOX) injection  40 mg Subcutaneous Q24H  . nicotine  14 mg Transdermal Daily  . piperacillin-tazobactam (ZOSYN)  IV  3.375 g Intravenous Q8H  . vancomycin  1,000 mg Intravenous Q12H   Continuous Infusions:   Assessment and  plan:  Principal Problem:   Cellulitis of leg, left Active Problems:   Polysubstance abuse   Hypokalemia   Hyponatremia   Opioid dependence   IV drug abuse   COPD bronchitis   1. Left lower extremity cellulitis. The patient denies illicit IV drug use into her left thigh, but she had a colored tattoo placed on her left lateral thigh last week which may be the etiology of her cellulitis. On admission, she was febrile with a temperature of 102.5 and a white blood cell count of 16.4. CT of her left leg revealed soft tissue swelling/edema, but no evidence of abscess. Her sedimentation rate was modestly to moderately elevated. She was started on vancomycin. Subsequently, Zosyn for broader coverage, given the recent tattoo. Warm compresses were added to help with the induration and pain. Overall, there is clinical improvement with the appearance of the left thigh with less induration and less erythema and less tenderness. Her white blood cell count has trended down to normal.  Her blood cultures are negative to date. We'll continue current management.  Left leg pain secondary to cellulitis. Although the patient has a history of polysubstance abuse and opiate dependence, she was started on IV morphine and IV hydrocodone as needed.  Hyponatremia. Patient's serum sodium was 131 on admission. This was likely from hypovolemia. With IV fluid hydration, her serum sodium has normalized. -Her TSH and cortisol level were within normal limits.  Hypokalemia. Her serum potassium was 3.1 on admission. She was given potassium chloride with improvement. Her magnesium level was within normal limits. We'll continue to supplement accordingly.  COPD. The patient has a few wheezes on exam, but doubt COPD exacerbation. Albuterol nebulizer was ordered. She was encouraged to stop smoking.  Polysubstance abuse/IV drug use. The patient was advised to stop. Clinical social worker consulted and provided patient with  outpatient services that can help her. She was also advised to stop smoking. Nicotine patch ordered as requested. Will order tobacco cessation counseling.    Time spent: 25 minutes    [place] [facility] Pager [phone]. If 7PM-7AM, please contact night-coverage at [email or web address], password [facility] 10/04/2014, 1:02 PM  LOS: 3 days

## 2014-10-05 LAB — CBC
HCT: 34.7 % — ABNORMAL LOW (ref 36.0–46.0)
Hemoglobin: 11.4 g/dL — ABNORMAL LOW (ref 12.0–15.0)
MCH: 28.4 pg (ref 26.0–34.0)
MCHC: 32.9 g/dL (ref 30.0–36.0)
MCV: 86.5 fL (ref 78.0–100.0)
Platelets: 409 10*3/uL — ABNORMAL HIGH (ref 150–400)
RBC: 4.01 MIL/uL (ref 3.87–5.11)
RDW: 15.7 % — ABNORMAL HIGH (ref 11.5–15.5)
WBC: 8.2 10*3/uL (ref 4.0–10.5)

## 2014-10-05 LAB — VANCOMYCIN, TROUGH: Vancomycin Tr: 10 ug/mL (ref 10.0–20.0)

## 2014-10-05 MED ORDER — AMOXICILLIN-POT CLAVULANATE 500-125 MG PO TABS
1.0000 | ORAL_TABLET | Freq: Three times a day (TID) | ORAL | Status: DC
  Administered 2014-10-05 – 2014-10-06 (×2): 500 mg via ORAL
  Filled 2014-10-05 (×2): qty 1

## 2014-10-05 NOTE — Progress Notes (Signed)
[facility] PROGRESS NOTE  [name] RDE:[id number] DOB: 06/21/1970 DOA: 10/01/2014 PCP: [name] Patient    Code Status: Full code Family Communication: Family not available, discussed with patient. Disposition Plan: Discharge when clinically appropriate.   Consultants:  None  Procedures:  None  Antibiotics:  Augmentin 6/25>  Zosyn 6/22>6/25  Vancomycin 6/21>  HPI/Subjective: The patient is starting to see significant decrease in redness and "hardness" of her left thigh. She is walk with less pain.   Objective: Filed Vitals:   10/05/14 1427  BP: 114/62  Pulse: 72  Temp: 97.6 F (36.4 C)  Resp: 18     Intake/Output Summary (Last 24 hours) at 10/05/14 1548 Last data filed at 10/05/14 1455  Gross per 24 hour  Intake   1680 ml  Output    600 ml  Net   1080 ml   Filed Weights   10/02/14 0000 10/03/14 0646 10/04/14 0605  Weight: 79.969 kg (176 lb 4.8 oz) 81.149 kg (178 lb 14.4 oz) 80.922 kg (178 lb 6.4 oz)    Exam:   General:  Alert 44 year old Caucasian woman in no acute distress.  Cardiovascular: S1, S2, with a soft systolic murmur.  Respiratory: Occasional scattered  wheezes; breathing unlabored.  Abdomen: Positive bowel sounds, soft, nontender, nondistended.  Musculoskeletal/extremities: Significantly decreasing induration and erythema on the anterior surface of her left thigh; mildly tender; no drainage. Notable left lateral thigh colored tattoo. Shotty inguinal nodes palpated with some tenderness. No pedal edema.  Neurologic/psychiatric: She has a flat affect, but she is alert and oriented 3. Her speech is clear.   Data Reviewed: Basic Metabolic Panel:  Recent Labs Lab 10/01/14 2025 10/02/14 0449 10/03/14 0500  NA 131* 138 137  K 3.1* 3.3* 3.9  CL 98* 101 104  CO2 23 27 25   GLUCOSE 125* 108* 118*  BUN 12 7 9   CREATININE 0.63 0.67 0.70  CALCIUM 8.1* 8.3* 8.1*  MG 1.8  --   --    Liver Function Tests:  Recent  Labs Lab 10/01/14 2025 10/02/14 0449  AST 19 14*  ALT 21 20  ALKPHOS 54 55  BILITOT 0.5 0.7  PROT 7.6 7.4  ALBUMIN 3.0* 2.9*   No results for input(s): LIPASE, AMYLASE in the last 168 hours. No results for input(s): AMMONIA in the last 168 hours. CBC:  Recent Labs Lab 10/01/14 2025 10/02/14 0449 10/03/14 0500 10/04/14 0528 10/05/14 0801  WBC 16.4* 15.4* 12.6* 9.3 8.2  NEUTROABS 12.2*  --   --   --   --   HGB 12.2 12.1 10.8* 10.4* 11.4*  HCT 35.5* 35.9* 32.3* 31.8* 34.7*  MCV 84.9 85.5 85.2 86.2 86.5  PLT 278 272 278 336 409*   Cardiac Enzymes: No results for input(s): CKTOTAL, CKMB, CKMBINDEX, TROPONINI in the last 168 hours. BNP (last 3 results) No results for input(s): BNP in the last 8760 hours.  ProBNP (last 3 results) No results for input(s): PROBNP in the last 8760 hours.  CBG: No results for input(s): GLUCAP in the last 168 hours.  Recent Results (from the past 240 hour(s))  Blood culture (routine x 2)     Status: None (Preliminary result)   Collection Time: 10/01/14  8:00 PM  Result Value Ref Range Status   Specimen Description BLOOD LEFT FOREARM  Final   Special Requests BOTTLES DRAWN AEROBIC AND ANAEROBIC 6CC  Final   Culture NO GROWTH 2 DAYS  Final   Report Status PENDING  Incomplete  Blood  culture (routine x 2)     Status: None (Preliminary result)   Collection Time: 10/01/14  8:25 PM  Result Value Ref Range Status   Specimen Description BLOOD LEFT HAND DRAWN BY RN  Final   Special Requests BOTTLES DRAWN AEROBIC AND ANAEROBIC 6CC  Final   Culture NO GROWTH 2 DAYS  Final   Report Status PENDING  Incomplete     Studies: No results found.  Scheduled Meds: . enoxaparin (LOVENOX) injection  40 mg Subcutaneous Q24H  . nicotine  14 mg Transdermal Daily  . piperacillin-tazobactam (ZOSYN)  IV  3.375 g Intravenous Q8H  . vancomycin  1,000 mg Intravenous Q12H   Continuous Infusions:   Assessment and plan:  Principal Problem:   Cellulitis of leg,  left Active Problems:   Polysubstance abuse   Hypokalemia   Hyponatremia   Opioid dependence   IV drug abuse   COPD bronchitis   1. Left lower extremity cellulitis. The patient denies illicit IV drug use into her left thigh, but she had a colored tattoo placed on her left lateral thigh last week which may be the etiology of her cellulitis. On admission, she was febrile with a temperature of 102.5 and a white blood cell count of 16.4. CT of her left leg revealed soft tissue swelling/edema, but no evidence of abscess. Her sedimentation rate was modestly to moderately elevated. She was started on vancomycin. Subsequently, Zosyn for broader coverage, given the recent tattoo. Warm compresses were added to help with the induration and pain. Overall, there is more clinical improvement with the appearance of the left thigh showing less induration and less erythema and less tenderness. Her white blood cell count has trended down to normal.  Her blood cultures are negative to date. We'll continue continue vancomycin, but will discontinue Zosyn in favor of Augmentin. Patient would likely need another 24-48 hours of IV vancomycin before discharge.  Left leg pain secondary to cellulitis. Although the patient has a history of polysubstance abuse and opiate dependence, she was started on IV morphine and IV hydrocodone as needed.  Hyponatremia. Patient's serum sodium was 131 on admission. This was likely from hypovolemia. With IV fluid hydration, her serum sodium has normalized. -Her TSH and cortisol level were within normal limits.  Hypokalemia. Her serum potassium was 3.1 on admission. She was given potassium chloride with improvement. Her magnesium level was within normal limits. We'll continue to supplement accordingly.  COPD. The patient has fewer wheezes on exam. We'll continue Albuterol nebulizer as ordered. She was encouraged to stop smoking.  Polysubstance abuse/IV drug use. The patient was  advised to stop. Clinical social worker consulted and provided patient with outpatient services that can help her. She was also advised to stop smoking. Nicotine patch ordered as requested. Will order tobacco cessation counseling.    Time spent: 25 minutes    [place] [facility] Pager [phone]. If 7PM-7AM, please contact night-coverage at [email or web address], password [facility] 10/05/2014, 3:48 PM  LOS: 4 days

## 2014-10-05 NOTE — Plan of Care (Signed)
Problem: Phase III Progression Outcomes Goal: Wound care performed by pt/family Outcome: Completed/Met Date Met:  10/05/14 Heat packs applied

## 2014-10-05 NOTE — Progress Notes (Signed)
ANTIBIOTIC CONSULT NOTE  Pharmacy Consult for Vancomycin Indication: cellulitis  Allergies  Allergen Reactions  . Doxycycline Other (See Comments)    burns  . Mango Flavor Hives and Swelling  . Pyridium [Phenazopyridine Hcl] Other (See Comments)    Irritation to skin  . Tramadol Nausea And Vomiting    Patient Measurements: Height: 5\' 1"  (154.9 cm) Weight: 178 lb 6.4 oz (80.922 kg) IBW/kg (Calculated) : 47.8   Vital Signs: Temp: 98.2 F (36.8 C) (06/25 0602) Temp Source: Oral (06/25 0602) BP: 104/63 mmHg (06/25 0602) Pulse Rate: 69 (06/25 0602)  Labs:  Recent Labs  10/03/14 0500 10/04/14 0528 10/05/14 0801  WBC 12.6* 9.3 8.2  HGB 10.8* 10.4* 11.4*  PLT 278 336 409*  CREATININE 0.70  --   --     Estimated Creatinine Clearance: 88.2 mL/min (by C-G formula based on Cr of 0.7).   Recent Labs  10/05/14 0801  [place] 10     Microbiology: Recent Results (from the past 720 hour(s))  Blood culture (routine x 2)     Status: None (Preliminary result)   Collection Time: 10/01/14  8:00 PM  Result Value Ref Range Status   Specimen Description BLOOD LEFT FOREARM  Final   Special Requests BOTTLES DRAWN AEROBIC AND ANAEROBIC 6CC  Final   Culture NO GROWTH 2 DAYS  Final   Report Status PENDING  Incomplete  Blood culture (routine x 2)     Status: None (Preliminary result)   Collection Time: 10/01/14  8:25 PM  Result Value Ref Range Status   Specimen Description BLOOD LEFT HAND DRAWN BY RN  Final   Special Requests BOTTLES DRAWN AEROBIC AND ANAEROBIC 6CC  Final   Culture NO GROWTH 2 DAYS  Final   Report Status PENDING  Incomplete    Medical History: Past Medical History  Diagnosis Date  . COPD (chronic obstructive pulmonary disease)   . Anxiety   . Panic attacks   . Pulmonary embolism   . Chronic back pain   . DDD (degenerative disc disease)   . Chronic knee pain   . Polysubstance abuse     cocaine, opiates, marijuana, amphetamines, "molly," crystal meth   . Hepatitis C infection 07/13/2014  . Nephrolithiasis     Medications:  Scheduled:  . enoxaparin (LOVENOX) injection  40 mg Subcutaneous Q24H  . nicotine  14 mg Transdermal Daily  . piperacillin-tazobactam (ZOSYN)  IV  3.375 g Intravenous Q8H  . vancomycin  1,000 mg Intravenous Q12H    Assessment: 44 yo F with cellulitis on L thigh likely due to tattoo vs IVDA (which pt denies). She continues on day#4 empiric, broad-spectrum antibiotics with Vanc & Zosyn. She is now afebrile & leukocytosis improved.  Cellulitis improving per RN report.  Renal function has been stable.  Vanc trough at goal.   Goal of Therapy:  Vancomycin trough level 10-15 mcg/ml  Plan:  Zosyn per MD Continue Vancomycin 1gm IV q12h Weekly Vancomycin trough Monitor renal function and cx data  Duration of therapy per MD- de-escalate to oral antibiotics once appropriate   [name], [place], [place] 10/05/2014,9:30 AM

## 2014-10-06 ENCOUNTER — Encounter (HOSPITAL_COMMUNITY): Payer: Self-pay | Admitting: Internal Medicine

## 2014-10-06 DIAGNOSIS — L03116 Cellulitis of left lower limb: Secondary | ICD-10-CM | POA: Diagnosis present

## 2014-10-06 LAB — CBC
HCT: 34.1 % — ABNORMAL LOW (ref 36.0–46.0)
Hemoglobin: 11.1 g/dL — ABNORMAL LOW (ref 12.0–15.0)
MCH: 28 pg (ref 26.0–34.0)
MCHC: 32.6 g/dL (ref 30.0–36.0)
MCV: 85.9 fL (ref 78.0–100.0)
Platelets: 445 10*3/uL — ABNORMAL HIGH (ref 150–400)
RBC: 3.97 MIL/uL (ref 3.87–5.11)
RDW: 15.3 % (ref 11.5–15.5)
WBC: 7.7 10*3/uL (ref 4.0–10.5)

## 2014-10-06 LAB — BASIC METABOLIC PANEL
Anion gap: 6 (ref 5–15)
BUN: 12 mg/dL (ref 6–20)
CO2: 28 mmol/L (ref 22–32)
Calcium: 8.5 mg/dL — ABNORMAL LOW (ref 8.9–10.3)
Chloride: 102 mmol/L (ref 101–111)
Creatinine, Ser: 0.7 mg/dL (ref 0.44–1.00)
GFR calc Af Amer: >60 mL/min (ref >60)
GFR calc non Af Amer: >60 mL/min (ref >60)
Glucose, Bld: 95 mg/dL (ref 65–99)
Potassium: 4.2 mmol/L (ref 3.5–5.1)
Sodium: 136 mmol/L (ref 135–145)

## 2014-10-06 MED ORDER — AMOXICILLIN 875 MG PO TABS: 875.0000 mg | ORAL_TABLET | Freq: Two times a day (BID) | ORAL | Status: DC

## 2014-10-06 MED ORDER — HYDROCODONE-ACETAMINOPHEN 5-325 MG PO TABS
1.0000 | ORAL_TABLET | Freq: Four times a day (QID) | ORAL | Status: DC | PRN
Start: 2014-10-06 — End: 2014-10-18

## 2014-10-06 NOTE — Discharge Summary (Signed)
Physician Discharge Summary  [name] GMW:[id number] DOB: February 06, 1971 DOA: 10/01/2014  PCP: [name] Patient  Admit date: 10/01/2014 Discharge date: 10/06/2014  Time spent: Greater than 30 minutes  Recommendations for Outpatient Follow-up:  1. Patient was encouraged to not use illicit IV drugs.    Discharge Diagnoses:    1. Left lower extremity cellulitis. 2. Hyponatremia secondary to hypovolemia. 3. Mild hypokalemia. 4. COPD/chronic bronchitis. 5. Polysubstance abuse/IV drug use; tobacco abuse. The patient was encouraged to stop and to seek outpatient help. 6. Obesity.   Discharge Condition: Improved.  Diet recommendation: Regular as tolerated.  Filed Weights   10/03/14 0646 10/04/14 0605 10/06/14 0518  Weight: 81.149 kg (178 lb 14.4 oz) 80.922 kg (178 lb 6.4 oz) 80.604 kg (177 lb 11.2 oz)    History of present illness:  The patient is a 44 year old woman with a history of COPD, chronic degenerative joint disease, hepatitis C positivity, and polysubstance abuse including IV illicit drugs, who presented to the emergency department on 08/01/2014 with a complaint of left thigh pain and redness. She reported getting a tattoo on her left lateral thigh placed several days prior to the onset. In the ED, she was febrile with a temperature 102.5, but otherwise hemodynamically stable. Her lab data were significant for a WBC of 16.4, potassium of 3.1, sodium of 131, and a CT of the left femur which showed diffuse soft tissue swelling along the anterior medial aspect of the left thigh with associated skin thickening; soft tissue edema tracks to the level of the fascia and surrounds the superficial vasculature; no evidence of abscess. She was admitted for further evaluation and management.   Hospital Course:  1. Left lower extremity cellulitis. The patient denied illicit IV drug use into her left thigh, but she had a colored tattoo placed on her left lateral thigh last week which was  likely the etiology of her cellulitis. On admission, she was febrile with a temperature of 102.5 and with a white blood cell count of 16.4. CT of her left leg revealed soft tissue swelling/edema, but no evidence of abscess. Her sedimentation rate was modestly to moderately elevated. She was started on vancomycin. Subsequently, Zosyn was added for broader coverage, given the recent tattoo. Warm compresses were added to help with the induration and pain. She improved clinically and symptomatically. Her white blood cell count has trended down to normal. Her blood cultures were negative to date. The extent of the erythema and induration decreased significantly, but there was still some residual erythema and induration. She received 5 days of IV antibiotic. She was discharged on 5 more days of amoxicillin. She was instructed to avoid IV drug use or further tattoos. She was understanding. Left leg pain secondary to cellulitis. Although the patient has a history of polysubstance abuse and opiate dependence, she was started on IV morphine and IV hydrocodone as needed for pain. She was discharged on Vicodin, 20 tablets with no refills. Hyponatremia. Patient's serum sodium was 131 on admission. This was likely from hypovolemia. With IV fluid hydration, her serum sodium did normalize. Her TSH and cortisol level were within normal limits. Hypokalemia. Her serum potassium was 3.1 on admission. She was given potassium chloride with improvement. Her magnesium level was within normal limits. Her serum potassium was 4.2 at discharge. COPD and tobacco abuse. The patient had a few wheezes on exam. She was treated with albuterol nebulizer. She was encouraged to stop smoking. Polysubstance abuse/IV drug use. The patient was advised to  stop. Clinical social worker consulted and provided patient with outpatient services that can help her remain sober. She was also advised to stop smoking. Nicotine patch was given as  requested.  Procedures:  None  Consultations:  None  Discharge Exam: Filed Vitals:   10/06/14 0518  BP: 115/72  Pulse: 72  Temp: 98.2 F (36.8 C)  Resp: 18   oxygen saturation 99% on room air.  General: Alert 44 year old Caucasian woman in no acute distress.  Cardiovascular: S1, S2, with a soft systolic murmur.  Respiratory: Occasional wheezes; decreased from yesterday. breathing unlabored.  Abdomen: Positive bowel sounds, soft, nontender, nondistended.  Musculoskeletal/extremities: Significantly decreasing induration and erythema on the anterior surface of her left thigh; mildly tender; no drainage. Notable left lateral thigh colored tattoo. Shotty inguinal nodes palpated with resolved tenderness. No pedal edema. Pedal pulses palpable. Much improved range of motion with flexion and extension and internal and external rotation of the left leg/hip.  Neurologic/psychiatric: She has a flat affect, but she is alert and oriented 3. Her speech is clear.  Discharge Instructions   Discharge Instructions    Diet general    Complete by:  As directed      Discharge instructions    Complete by:  As directed   Keep red area on your left leg clean and dry. Do not pick at the areas of induration or sores. Do not use IV illegal drugs. Try to stop smoking.     Increase activity slowly    Complete by:  As directed           Current Discharge Medication List    START taking these medications   Details  amoxicillin (AMOXIL) 875 MG tablet Take 1 tablet (875 mg total) by mouth 2 (two) times daily. Antibiotic to be taken for 5 more days. Qty: 10 tablet, Refills: 0    HYDROcodone-acetaminophen (NORCO/VICODIN) 5-325 MG per tablet Take 1 tablet by mouth every 6 (six) hours as needed for moderate pain. Qty: 20 tablet, Refills: 0      CONTINUE these medications which have NOT CHANGED   Details  albuterol (PROVENTIL HFA;VENTOLIN HFA) 108 (90 BASE) MCG/ACT inhaler Inhale 2 puffs into  the lungs every 6 (six) hours as needed for wheezing or shortness of breath. Qty: 1 Inhaler, Refills: 1    EPINEPHrine (EPIPEN 2-PAK) 0.3 mg/0.3 mL IJ SOAJ injection Inject 0.3 mLs (0.3 mg total) into the muscle once. Qty: 1 Device, Refills: 1       Allergies  Allergen Reactions  . Doxycycline Other (See Comments)    burns  . Mango Flavor Hives and Swelling  . Pyridium [Phenazopyridine Hcl] Other (See Comments)    Irritation to skin  . Tramadol Nausea And Vomiting   Follow-up Information    Please follow up.   Why:  Follow-up with your physician at the [facility] clinic in one week.       The results of significant diagnostics from this hospitalization (including imaging, microbiology, ancillary and laboratory) are listed below for reference.    Significant Diagnostic Studies: Ct Femur Left W Contrast  10/01/2014   CLINICAL DATA:  Left anteromedial thigh swelling and erythema, acute onset. Initial encounter.  EXAM: CT OF THE LOWER LEFT EXTREMITY WITH CONTRAST  TECHNIQUE: Multidetector CT imaging of the left mid femur was performed according to the standard protocol following intravenous contrast administration.  COMPARISON:  None.  CONTRAST:  185mL OMNIPAQUE IOHEXOL 300 MG/ML  SOLN  FINDINGS: Diffuse soft tissue swelling is  noted along the anteromedial aspect of the left thigh, in the scanned region, with associated skin thickening and soft tissue edema tracking to the level of the fascia. This surrounds the underlying superficial vasculature. The vasculature is difficult to fully assess due to surrounding edema.  There is no evidence of intramuscular involvement. No abscess is seen. Findings are compatible with acute cellulitis. The underlying os structures are grossly unremarkable in appearance. No soft tissue air is identified.  IMPRESSION: Diffuse soft tissue swelling along the anteromedial aspect of the left thigh, with associated skin thickening. Soft tissue edema tracks to the  level of the fascia and surrounds the superficial vasculature. No evidence of abscess. Findings compatible with acute cellulitis.   Electronically Signed   By: Garald Balding M.D.   On: 10/01/2014 21:54    Microbiology: Recent Results (from the past 240 hour(s))  Blood culture (routine x 2)     Status: None (Preliminary result)   Collection Time: 10/01/14  8:00 PM  Result Value Ref Range Status   Specimen Description BLOOD LEFT FOREARM  Final   Special Requests BOTTLES DRAWN AEROBIC AND ANAEROBIC 6CC  Final   Culture NO GROWTH 2 DAYS  Final   Report Status PENDING  Incomplete  Blood culture (routine x 2)     Status: None (Preliminary result)   Collection Time: 10/01/14  8:25 PM  Result Value Ref Range Status   Specimen Description BLOOD LEFT HAND DRAWN BY RN  Final   Special Requests BOTTLES DRAWN AEROBIC AND ANAEROBIC 6CC  Final   Culture NO GROWTH 2 DAYS  Final   Report Status PENDING  Incomplete     Labs: Basic Metabolic Panel:  Recent Labs Lab 10/01/14 2025 10/02/14 0449 10/03/14 0500 10/06/14 0604  NA 131* 138 137 136  K 3.1* 3.3* 3.9 4.2  CL 98* 101 104 102  CO2 23 27 25 28   GLUCOSE 125* 108* 118* 95  BUN 12 7 9 12   CREATININE 0.63 0.67 0.70 0.70  CALCIUM 8.1* 8.3* 8.1* 8.5*  MG 1.8  --   --   --    Liver Function Tests:  Recent Labs Lab 10/01/14 2025 10/02/14 0449  AST 19 14*  ALT 21 20  ALKPHOS 54 55  BILITOT 0.5 0.7  PROT 7.6 7.4  ALBUMIN 3.0* 2.9*   No results for input(s): LIPASE, AMYLASE in the last 168 hours. No results for input(s): AMMONIA in the last 168 hours. CBC:  Recent Labs Lab 10/01/14 2025 10/02/14 0449 10/03/14 0500 10/04/14 0528 10/05/14 0801 10/06/14 0604  WBC 16.4* 15.4* 12.6* 9.3 8.2 7.7  NEUTROABS 12.2*  --   --   --   --   --   HGB 12.2 12.1 10.8* 10.4* 11.4* 11.1*  HCT 35.5* 35.9* 32.3* 31.8* 34.7* 34.1*  MCV 84.9 85.5 85.2 86.2 86.5 85.9  PLT 278 272 278 336 409* 445*   Cardiac Enzymes: No results for input(s):  CKTOTAL, CKMB, CKMBINDEX, TROPONINI in the last 168 hours. BNP: BNP (last 3 results) No results for input(s): BNP in the last 8760 hours.  ProBNP (last 3 results) No results for input(s): PROBNP in the last 8760 hours.  CBG: No results for input(s): GLUCAP in the last 168 hours.     Signed:  Diasha Castleman  [facility] 10/06/2014, 12:28 PM

## 2014-10-07 LAB — CULTURE, BLOOD (ROUTINE X 2)
Culture: NO GROWTH
Culture: NO GROWTH

## 2014-10-18 ENCOUNTER — Emergency Department (HOSPITAL_COMMUNITY)
Admission: EM | Admit: 2014-10-18 | Discharge: 2014-10-18 | Disposition: A | Discharge status: Home / Self Care | Payer: Medicare Other | Attending: Emergency Medicine | Admitting: Emergency Medicine

## 2014-10-18 ENCOUNTER — Encounter (HOSPITAL_COMMUNITY): Payer: Self-pay | Admitting: *Deleted

## 2014-10-18 DIAGNOSIS — J449 Chronic obstructive pulmonary disease, unspecified: Secondary | ICD-10-CM | POA: Diagnosis not present

## 2014-10-18 DIAGNOSIS — Z87442 Personal history of urinary calculi: Secondary | ICD-10-CM | POA: Diagnosis not present

## 2014-10-18 DIAGNOSIS — G8929 Other chronic pain: Secondary | ICD-10-CM | POA: Insufficient documentation

## 2014-10-18 DIAGNOSIS — Z8739 Personal history of other diseases of the musculoskeletal system and connective tissue: Secondary | ICD-10-CM | POA: Diagnosis not present

## 2014-10-18 DIAGNOSIS — F1410 Cocaine abuse, uncomplicated: Secondary | ICD-10-CM | POA: Diagnosis not present

## 2014-10-18 DIAGNOSIS — Z3202 Encounter for pregnancy test, result negative: Secondary | ICD-10-CM | POA: Insufficient documentation

## 2014-10-18 DIAGNOSIS — F1110 Opioid abuse, uncomplicated: Secondary | ICD-10-CM | POA: Insufficient documentation

## 2014-10-18 DIAGNOSIS — Z86711 Personal history of pulmonary embolism: Secondary | ICD-10-CM | POA: Insufficient documentation

## 2014-10-18 DIAGNOSIS — F1910 Other psychoactive substance abuse, uncomplicated: Secondary | ICD-10-CM

## 2014-10-18 DIAGNOSIS — Z8619 Personal history of other infectious and parasitic diseases: Secondary | ICD-10-CM | POA: Insufficient documentation

## 2014-10-18 DIAGNOSIS — R109 Unspecified abdominal pain: Secondary | ICD-10-CM | POA: Diagnosis present

## 2014-10-18 DIAGNOSIS — Z79899 Other long term (current) drug therapy: Secondary | ICD-10-CM | POA: Diagnosis not present

## 2014-10-18 DIAGNOSIS — Z720 Tobacco use: Secondary | ICD-10-CM | POA: Diagnosis not present

## 2014-10-18 LAB — CBC WITH DIFFERENTIAL/PLATELET
Basophils Absolute: 0.1 10*3/uL (ref 0.0–0.1)
Basophils Relative: 1 % (ref 0–1)
Eosinophils Absolute: 0.3 10*3/uL (ref 0.0–0.7)
Eosinophils Relative: 2 % (ref 0–5)
HCT: 35.9 % — ABNORMAL LOW (ref 36.0–46.0)
Hemoglobin: 12.1 g/dL (ref 12.0–15.0)
Lymphocytes Relative: 36 % (ref 12–46)
Lymphs Abs: 3.8 10*3/uL (ref 0.7–4.0)
MCH: 29 pg (ref 26.0–34.0)
MCHC: 33.7 g/dL (ref 30.0–36.0)
MCV: 86.1 fL (ref 78.0–100.0)
Monocytes Absolute: 0.6 10*3/uL (ref 0.1–1.0)
Monocytes Relative: 6 % (ref 3–12)
Neutro Abs: 5.7 10*3/uL (ref 1.7–7.7)
Neutrophils Relative %: 55 % (ref 43–77)
Platelets: 364 10*3/uL (ref 150–400)
RBC: 4.17 MIL/uL (ref 3.87–5.11)
RDW: 15.9 % — ABNORMAL HIGH (ref 11.5–15.5)
WBC: 10.4 10*3/uL (ref 4.0–10.5)

## 2014-10-18 LAB — URINALYSIS, ROUTINE W REFLEX MICROSCOPIC
Bilirubin Urine: NEGATIVE
Glucose, UA: NEGATIVE mg/dL
Hgb urine dipstick: NEGATIVE
Ketones, ur: NEGATIVE mg/dL
Leukocytes, UA: NEGATIVE
Nitrite: NEGATIVE
Protein, ur: NEGATIVE mg/dL
Specific Gravity, Urine: 1.01 (ref 1.005–1.030)
Urobilinogen, UA: 0.2 mg/dL (ref 0.0–1.0)
pH: 7.5 (ref 5.0–8.0)

## 2014-10-18 LAB — SALICYLATE LEVEL: Salicylate Lvl: <4 mg/dL (ref 2.8–30.0)

## 2014-10-18 LAB — COMPREHENSIVE METABOLIC PANEL
ALT: 42 U/L (ref 14–54)
AST: 37 U/L (ref 15–41)
Albumin: 3.4 g/dL — ABNORMAL LOW (ref 3.5–5.0)
Alkaline Phosphatase: 47 U/L (ref 38–126)
Anion gap: 10 (ref 5–15)
BUN: 11 mg/dL (ref 6–20)
CO2: 24 mmol/L (ref 22–32)
Calcium: 8.7 mg/dL — ABNORMAL LOW (ref 8.9–10.3)
Chloride: 105 mmol/L (ref 101–111)
Creatinine, Ser: 0.89 mg/dL (ref 0.44–1.00)
GFR calc Af Amer: >60 mL/min (ref >60)
GFR calc non Af Amer: >60 mL/min (ref >60)
Glucose, Bld: 109 mg/dL — ABNORMAL HIGH (ref 65–99)
Potassium: 3.4 mmol/L — ABNORMAL LOW (ref 3.5–5.1)
Sodium: 139 mmol/L (ref 135–145)
Total Bilirubin: 0.4 mg/dL (ref 0.3–1.2)
Total Protein: 7.6 g/dL (ref 6.5–8.1)

## 2014-10-18 LAB — RAPID URINE DRUG SCREEN, HOSP PERFORMED
Amphetamines: NOT DETECTED
Barbiturates: NOT DETECTED
Benzodiazepines: NOT DETECTED
Cocaine: POSITIVE — AB
Opiates: POSITIVE — AB
Tetrahydrocannabinol: NOT DETECTED

## 2014-10-18 LAB — ACETAMINOPHEN LEVEL: Acetaminophen (Tylenol), Serum: <10 ug/mL — ABNORMAL LOW (ref 10–30)

## 2014-10-18 LAB — LIPASE, BLOOD: Lipase: 19 U/L — ABNORMAL LOW (ref 22–51)

## 2014-10-18 LAB — POC URINE PREG, ED: Preg Test, Ur: NEGATIVE

## 2014-10-18 NOTE — ED Provider Notes (Signed)
CSN: [id number]     Arrival date & time 10/18/14  0030 History   First MD Initiated Contact with Patient 10/18/14 0107     Chief Complaint  Patient presents with  . Abdominal Pain   LEVEL 5 CAVEAT DUE TO ALTERED MENTAL STATUS  Patient is a 44 y.o. female presenting with abdominal pain. The history is provided by the patient. The history is limited by the condition of the patient.  Abdominal Pain Pain location: UNKNOWN. Pain quality comment:  UNKNOWN Pain severity now: UNKNOWN. Onset quality: UNKNOWN. Relieved by: UNKNOWN. Exacerbated by: UNKNOWN. Patient presents for abdominal pain It is reported EMS was called for abdominal pain On arrival to the ER, pt is somnolent with intermittent episodes of agitation She did admit to taking oral pain medications as well cocaine  No other details are known at arrival  Past Medical History  Diagnosis Date  . COPD (chronic obstructive pulmonary disease)   . Anxiety   . Panic attacks   . Pulmonary embolism   . Chronic back pain   . DDD (degenerative disc disease)   . Chronic knee pain   . Polysubstance abuse     cocaine, opiates, marijuana, amphetamines, "molly," crystal meth  . Hepatitis C infection 07/13/2014  . Nephrolithiasis   . IV drug abuse    Past Surgical History  Procedure Laterality Date  . Cesarean section    . Nose surgery    . Tubes tied     Family History  Problem Relation Age of Onset  . Arthritis    . Lung disease    . Cancer    . Asthma    . COPD Mother   . Hypertension Father   . Heart attack Father   . Diabetes Father    History  Substance Use Topics  . Smoking status: Current Every Day Smoker -- 0.50 packs/day for 25 years    Types: Cigarettes  . Smokeless tobacco: Never Used  . Alcohol Use: No   OB History    Gravida Para Term Preterm AB TAB SAB Ectopic Multiple Living   2 2 2       2      Review of Systems  Unable to perform ROS: Mental status change  Gastrointestinal: Positive for abdominal  pain.      Allergies  Doxycycline; Mango flavor; Pyridium; and Tramadol  Home Medications   Prior to Admission medications   Medication Sig Start Date End Date Taking? Authorizing Provider  albuterol (PROVENTIL HFA;VENTOLIN HFA) 108 (90 BASE) MCG/ACT inhaler Inhale 2 puffs into the lungs every 6 (six) hours as needed for wheezing or shortness of breath. 07/16/14   Radene Gunning, NP  amoxicillin (AMOXIL) 875 MG tablet Take 1 tablet (875 mg total) by mouth 2 (two) times daily. Antibiotic to be taken for 5 more days. 10/06/14   Rexene Alberts, MD  EPINEPHrine (EPIPEN 2-PAK) 0.3 mg/0.3 mL IJ SOAJ injection Inject 0.3 mLs (0.3 mg total) into the muscle once. 09/21/14   Nat Christen, MD  HYDROcodone-acetaminophen (NORCO/VICODIN) 5-325 MG per tablet Take 1 tablet by mouth every 6 (six) hours as needed for moderate pain. 10/06/14   Rexene Alberts, MD   BP 127/97 mmHg  Pulse 63  Temp(Src) 97.7 F (36.5 C) (Oral)  Resp 22  Ht 5\' 1"  (1.549 m)  Wt 240 lb (108.863 kg)  BMI 45.37 kg/m2  SpO2 95%  LMP 09/27/2014 Physical Exam CONSTITUTIONAL: disheveled, somnolent HEAD: Normocephalic/atraumatic EYES: EOMI, no icterus ENMT: Mucous membranes moist  NECK: supple no meningeal signs SPINE/BACK:entire spine nontender CV: S1/S2 noted, no murmurs/rubs/gallops noted LUNGS: Lungs are clear to auscultation bilaterally, no apparent distress ABDOMEN: soft, nontender, no rebound or guarding, bowel sounds noted throughout abdomen GU:no cva tenderness NEURO: Pt is somnolent but arousable and mumbles incoherently. She moves around in bed without difficulty   EXTREMITIES: pulses normal/equal, full ROM SKIN: warm, color normal, cellulitis appears improved on left thigh PSYCH: unable to assess  ED Course  Procedures   2:08 AM Pt seen for reported abdominal pain On my evaluation she is somnolent but arousable.  Her speech is unintelligible.  I suspect this is due to polysubstance abuse.  Her abdomen is benign at  this time.  Will monitor in the ED 3:12 AM Pt stable Labs reassuring other than polysubstance abuse Pt resting comfortably Will continue to monitor 3:49 AM Pt more awake/alert at this time She admits to drug abuse - advised to stop all drug abuse, pt understands this and understands it is lethal She denies any abd pain or any other pain at this time She is ambulatory BP 104/62 mmHg  Pulse 58  Temp(Src) 97.7 F (36.5 C) (Oral)  Resp 19  Ht 5\' 1"  (1.549 m)  Wt 240 lb (108.863 kg)  BMI 45.37 kg/m2  SpO2 93%  LMP 09/27/2014 She has no other complaints She reports she can get a ride home - she just needs to call "[place]" to come pick her up [place] - Abnormal; Notable for the following:    Potassium 3.4 (*)    Glucose, Bld 109 (*)    Calcium 8.7 (*)    Albumin 3.4 (*)    All other components within normal limits  CBC WITH DIFFERENTIAL/PLATELET - Abnormal; Notable for the following:    HCT 35.9 (*)    RDW 15.9 (*)    All other components within normal limits  ACETAMINOPHEN LEVEL - Abnormal; Notable for the following:    Acetaminophen (Tylenol), Serum <10 (*)    All other components within normal limits  LIPASE, BLOOD - Abnormal; Notable for the following:    Lipase 19 (*)    All other components within normal limits  URINE RAPID DRUG SCREEN, HOSP PERFORMED - Abnormal; Notable for the following:    Opiates POSITIVE (*)    Cocaine POSITIVE (*)    All other components within normal limits  URINALYSIS, ROUTINE W REFLEX MICROSCOPIC (NOT AT [facility]) - Abnormal; Notable for the following:    Color, Urine STRAW (*)    All other components within normal limits  SALICYLATE LEVEL  POC URINE PREG, ED     EKG Interpretation   Date/Time:  Friday October 18 2014 01:18:47 EDT Ventricular Rate:  75 PR Interval:  169 QRS Duration: 97 QT Interval:  407 QTC Calculation: 455 R Axis:   58 Text Interpretation:  Sinus rhythm Baseline wander  in lead(s) V6 artifact  noted Otherwise no significant change Confirmed by Christy Gentles  MD, Elenore Rota  [phone]) on 10/18/2014 1:46:16 AM      MDM   Final diagnoses:  Polysubstance abuse    Nursing notes including past medical history and social history reviewed and considered in documentation Labs/vital reviewed myself and considered during evaluation     Ripley Fraise, MD 10/18/14 [phone]

## 2014-10-18 NOTE — ED Notes (Signed)
Pt. C/o abdominal pain and bursts of energy. Pt. Hyperactive. Pt. Reports using cocaine, crack, and morphine tonight. No breathing difficulties noted.

## 2014-10-18 NOTE — ED Notes (Signed)
Pt. Ambulated to bathroom without difficulty

## 2014-10-18 NOTE — Discharge Instructions (Signed)
Drug Abuse and Addiction in Sports There are many types of drugs that one may become addicted to including illegal drugs (marijuana, cocaine, amphetamines, hallucinogens, and narcotics), prescription drugs (hydrocodone, codeine, and alprazolam), and other chemicals such as alcohol or nicotine. Two types of addiction exist: physical and emotional. Physical addiction usually occurs after prolonged use of a drug. However, some drugs may only take a couple uses before addiction can occur. Physical addiction is marked by withdrawal symptoms in which the person experiences negative symptoms such as sweat, anxiety, tremors, hallucinations, or cravings in the absence of using the drug. Emotional dependence is the psychological desire for the "high" that the drugs produce when taken. SYMPTOMS   Inattentiveness.  Negligence.  Forgetfulness.  Insomnia.  Mood swings. RISK INCREASES WITH:   Family history of addiction.  Personal history of addictive personality. Studies have shown that risk takers, which many athletes are, have a higher risk of addiction. PREVENTION The only adequate prevention of drug abuse is abstinence from drugs. TREATMENT  The first step in quitting substance abuse is recognizing the problem and realizing that one has the power to change. Quitting requires a plan and support from others. It is often necessary to seek medical assistance. Caregivers are available to offer counseling, and for certain cases, medicine to diminish the physical symptoms of withdrawal. Many organizations exist such as Alcoholics Anonymous, Narcotics Anonymous, or the [employer] on Alcoholism that offer support for individuals who have chosen to quit their habits. Document Released: 03/29/2005 Document Revised: 08/13/2013 Document Reviewed: 07/11/2008 [facility] Patient Information 2015 [place], [place]. This information is not intended to replace advice given to you by your health care provider. Make  sure you discuss any questions you have with your health care provider.    [facility] Dept. [address]         [place] Phone:  [phone]                                  Phone:  [phone]                   Phone:  [phone]  [place], [place] in [place], [address]                                  [place] [phone] or [phone] (After Hours)   [place]  Substance Abuse Resources: - [facility] Drug [facility]  [place] [phone] -  [facility] [phone] - Residential & Outpatient Substance Abuse Program  [phone]  Psychological Services: - [place]  [place]  [place], [phone] [place]. [address], [place], [place]: [phone] or [phone], [email or web address]  -  - [place] Department- Protection Department- [place] Department- [phone]

## 2014-10-18 NOTE — ED Notes (Addendum)
Pt c/o abd pain that started two days ago, denies any n/v, took one 80mg  morphine tablet that she got from someone today to help with the pain but has been having periods of "energy" since taking the tablet, pt will be lying on stretcher then jump, pt also admits to taking two norco and a hit of cocaine,

## 2014-10-18 NOTE — ED Notes (Signed)
Patient responds to verbal command, but goes right back to sleep. Snoring at this time.

## 2015-05-03 DIAGNOSIS — J40 Bronchitis, not specified as acute or chronic: Secondary | ICD-10-CM | POA: Diagnosis not present

## 2015-05-03 DIAGNOSIS — R05 Cough: Secondary | ICD-10-CM | POA: Diagnosis not present

## 2015-05-03 DIAGNOSIS — R0602 Shortness of breath: Secondary | ICD-10-CM | POA: Diagnosis not present

## 2015-05-03 DIAGNOSIS — F17200 Nicotine dependence, unspecified, uncomplicated: Secondary | ICD-10-CM | POA: Diagnosis not present

## 2015-05-03 DIAGNOSIS — J449 Chronic obstructive pulmonary disease, unspecified: Secondary | ICD-10-CM | POA: Diagnosis not present

## 2015-05-03 DIAGNOSIS — Z720 Tobacco use: Secondary | ICD-10-CM | POA: Diagnosis not present

## 2016-01-01 ENCOUNTER — Emergency Department (HOSPITAL_COMMUNITY): Payer: Medicare Other

## 2016-01-01 ENCOUNTER — Emergency Department (HOSPITAL_COMMUNITY)
Admission: EM | Admit: 2016-01-01 | Discharge: 2016-01-01 | Disposition: A | Discharge status: Home / Self Care | Payer: Medicare Other | Attending: Emergency Medicine | Admitting: Emergency Medicine

## 2016-01-01 ENCOUNTER — Encounter (HOSPITAL_COMMUNITY): Payer: Self-pay

## 2016-01-01 DIAGNOSIS — R0602 Shortness of breath: Secondary | ICD-10-CM | POA: Insufficient documentation

## 2016-01-01 DIAGNOSIS — M19011 Primary osteoarthritis, right shoulder: Secondary | ICD-10-CM | POA: Diagnosis not present

## 2016-01-01 DIAGNOSIS — J449 Chronic obstructive pulmonary disease, unspecified: Secondary | ICD-10-CM | POA: Insufficient documentation

## 2016-01-01 DIAGNOSIS — M549 Dorsalgia, unspecified: Secondary | ICD-10-CM | POA: Insufficient documentation

## 2016-01-01 DIAGNOSIS — F17210 Nicotine dependence, cigarettes, uncomplicated: Secondary | ICD-10-CM | POA: Insufficient documentation

## 2016-01-01 DIAGNOSIS — R52 Pain, unspecified: Secondary | ICD-10-CM | POA: Diagnosis not present

## 2016-01-01 DIAGNOSIS — M25511 Pain in right shoulder: Secondary | ICD-10-CM | POA: Diagnosis not present

## 2016-01-01 DIAGNOSIS — M7989 Other specified soft tissue disorders: Secondary | ICD-10-CM | POA: Diagnosis not present

## 2016-01-01 MED ORDER — METHYLPREDNISOLONE SODIUM SUCC 125 MG IJ SOLR
125.0000 mg | Freq: Once | INTRAMUSCULAR | Status: AC
  Administered 2016-01-01: 125 mg via INTRAVENOUS
  Filled 2016-01-01: qty 2

## 2016-01-01 MED ORDER — DEXAMETHASONE 4 MG PO TABS: 4.0000 mg | ORAL_TABLET | Freq: Two times a day (BID) | ORAL | 0 refills | Status: DC

## 2016-01-01 MED ORDER — CYCLOBENZAPRINE HCL 10 MG PO TABS: 10.0000 mg | ORAL_TABLET | Freq: Three times a day (TID) | ORAL | 0 refills | Status: DC

## 2016-01-01 MED ORDER — DICLOFENAC SODIUM 75 MG PO TBEC
75.0000 mg | DELAYED_RELEASE_TABLET | Freq: Two times a day (BID) | ORAL | 0 refills | Status: DC
Start: 2016-01-01 — End: 2016-01-01

## 2016-01-01 MED ORDER — CYCLOBENZAPRINE HCL 10 MG PO TABS
10.0000 mg | ORAL_TABLET | Freq: Once | ORAL | Status: AC
Start: 2016-01-01 — End: 2016-01-01
  Administered 2016-01-01: 10 mg via ORAL
  Filled 2016-01-01: qty 1

## 2016-01-01 MED ORDER — PROCHLORPERAZINE EDISYLATE 5 MG/ML IJ SOLN
5.0000 mg | Freq: Once | INTRAMUSCULAR | Status: AC
  Administered 2016-01-01: 5 mg via INTRAVENOUS
  Filled 2016-01-01: qty 2

## 2016-01-01 MED ORDER — KETOROLAC TROMETHAMINE 30 MG/ML IJ SOLN
INTRAMUSCULAR | Status: AC
  Filled 2016-01-01: qty 1

## 2016-01-01 MED ORDER — KETOROLAC TROMETHAMINE 30 MG/ML IJ SOLN
30.0000 mg | Freq: Once | INTRAMUSCULAR | Status: AC
  Administered 2016-01-01: 30 mg via INTRAVENOUS
  Filled 2016-01-01: qty 1

## 2016-01-01 MED ORDER — DICLOFENAC SODIUM 75 MG PO TBEC: 75.0000 mg | DELAYED_RELEASE_TABLET | Freq: Two times a day (BID) | ORAL | 0 refills | Status: DC

## 2016-01-01 NOTE — ED Notes (Signed)
Pt c/o pain to r shoulder and swelling over r clavicle x 1 week.  Denies injury.  Pt says "I'm pretty rough, I'm a tomboy so there is no telling what I could've done."

## 2016-01-01 NOTE — ED Provider Notes (Signed)
[facility] DEPT Provider Note   CSN: [id number] Arrival date & time: 01/01/16  1053     History   Chief Complaint Chief Complaint  Patient presents with  . Shoulder Pain    HPI [name] is a 45 y.o. female.  The history is provided by the patient.  Shoulder Pain   This is a chronic problem. The current episode started more than 1 week ago. The problem occurs daily. The problem has been gradually worsening. The pain is present in the right shoulder. The pain is at a severity of 10/10. Associated symptoms include limited range of motion and stiffness. Exacerbated by: movement. She has tried nothing for the symptoms. There has been no history of extremity trauma.    Past Medical History:  Diagnosis Date  . Anxiety   . Chronic back pain   . Chronic knee pain   . COPD (chronic obstructive pulmonary disease) ([place])   . DDD (degenerative disc disease)   . Hepatitis C infection 07/13/2014  . IV drug abuse   . Nephrolithiasis   . Panic attacks   . Polysubstance abuse    cocaine, opiates, marijuana, amphetamines, "molly," crystal meth  . Pulmonary embolism [facility])     Patient Active Problem List   Diagnosis Date Noted  . Cellulitis of left lower extremity   . COPD bronchitis 10/03/2014  . Cellulitis of leg, left 10/01/2014  . Hypokalemia 10/01/2014  . Hyponatremia 10/01/2014  . Abscess and cellulitis   . Abscess of forearm, right   . Hepatitis C infection 07/13/2014  . Cellulitis and abscess 07/12/2014  . Protein-calorie malnutrition, severe ([place]) 07/12/2014  . Abscess of forearm, left   . IV drug abuse   . Opioid dependence with opioid-induced mood disorder ([place])   . Opioid dependence ([place]) 06/17/2014  . Substance induced mood disorder ([place]) 06/17/2014  . Polysubstance abuse 06/16/2014    Class: Chronic  . Suicide ideation 06/16/2014    Class: Acute  . PFS (patellofemoral syndrome) 06/06/2012  . Obesity, unspecified 06/06/2012    Past Surgical History:    Procedure Laterality Date  . CESAREAN SECTION    . NOSE SURGERY    . tubes tied      OB History    Gravida Para Term Preterm AB Living   2 2 2     2    SAB TAB Ectopic Multiple Live Births                   Home Medications    Prior to Admission medications   Medication Sig Start Date End Date Taking? Authorizing Provider  albuterol (PROVENTIL HFA;VENTOLIN HFA) 108 (90 BASE) MCG/ACT inhaler Inhale 2 puffs into the lungs every 6 (six) hours as needed for wheezing or shortness of breath. 07/16/14  Yes Radene Gunning, NP    Family History Family History  Problem Relation Age of Onset  . COPD Mother   . Hypertension Father   . Heart attack Father   . Diabetes Father   . Arthritis    . Lung disease    . Cancer    . Asthma      Social History Social History  Substance Use Topics  . Smoking status: Current Every Day Smoker    Packs/day: 0.50    Years: 25.00    Types: Cigarettes  . Smokeless tobacco: Never Used  . Alcohol use No     Allergies   Doxycycline; Mango flavor; Pyridium [phenazopyridine hcl]; and Tramadol  Review of Systems Review of Systems  Respiratory: Positive for shortness of breath.   Musculoskeletal: Positive for arthralgias, back pain and stiffness.  Psychiatric/Behavioral: The patient is nervous/anxious.   All other systems reviewed and are negative.    Physical Exam Updated Vital Signs BP 122/79 (BP Location: Left Arm)   Pulse 71   Temp 98.2 F (36.8 C) (Oral)   Resp 20   Ht 5\' 1"  (1.549 m)   Wt 77.1 kg   SpO2 95%   BMI 32.12 kg/m   Physical Exam  Constitutional: She is oriented to person, place, and time. She appears well-developed and well-nourished.  Non-toxic appearance.  HENT:  Head: Normocephalic.  Right Ear: Tympanic membrane and external ear normal.  Left Ear: Tympanic membrane and external ear normal.  Eyes: EOM and lids are normal. Pupils are equal, round, and reactive to light.  Neck: Normal range of motion. Neck  supple. Carotid bruit is not present.  Cardiovascular: Normal rate, regular rhythm, normal heart sounds, intact distal pulses and normal pulses.   Pulmonary/Chest: Breath sounds normal. No respiratory distress.  Abdominal: Soft. Bowel sounds are normal. There is no tenderness. There is no guarding.  Musculoskeletal: She exhibits tenderness.  The right trapezius is tight and tense and tender. Pain at the [facility] joint area of the right shoulder. Pt will not cooperated for ROM evaluation. No evidence of dislocation. The shoulder is not hot. No deformity of the clavicle. Brachial and radial pulse 2+..  Lymphadenopathy:       Head (right side): No submandibular adenopathy present.       Head (left side): No submandibular adenopathy present.    She has no cervical adenopathy.  Neurological: She is alert and oriented to person, place, and time. She has normal strength. No cranial nerve deficit or sensory deficit.  Skin: Skin is warm and dry.  Psychiatric: She has a normal mood and affect. Her speech is normal.  Nursing note and vitals reviewed.    ED Treatments / Results  Labs (all labs ordered are listed, but only abnormal results are displayed) Labs Reviewed - No data to display  EKG  EKG Interpretation None       Radiology Dg Clavicle Right  Result Date: 01/01/2016 CLINICAL DATA:  Swelling.  Pain. EXAM: RIGHT CLAVICLE - 2+ VIEWS COMPARISON:  05/03/2015 . FINDINGS: No acute bony abnormality. Acromioclavicular glenohumeral degenerative change. IMPRESSION: Acromioclavicular and glenohumeral degenerative change. No acute abnormality. Electronically Signed   By: Marcello Moores  Register   On: 01/01/2016 12:00   Dg Shoulder Right  Result Date: 01/01/2016 CLINICAL DATA:  Right shoulder pain.  Swelling. EXAM: RIGHT SHOULDER - 2+ VIEW COMPARISON:  05/03/2015. FINDINGS: No acute bony or joint abnormality identified. No evidence of fracture or dislocation. IMPRESSION: No acute or focal abnormality.  Electronically Signed   By: Marcello Moores  Register   On: 01/01/2016 11:59    Procedures Procedures (including critical care time)  Medications Ordered in ED Medications  methylPREDNISolone sodium succinate (SOLU-MEDROL) 125 mg/2 mL injection 125 mg (not administered)  ketorolac (TORADOL) 30 MG/ML injection 30 mg (not administered)  prochlorperazine (COMPAZINE) injection 5 mg (not administered)  cyclobenzaprine (FLEXERIL) tablet 10 mg (not administered)     Initial Impression / Assessment and Plan / ED Course  I have reviewed the triage vital signs and the nursing notes.  Pertinent labs & imaging results that were available during my care of the patient were reviewed by me and considered in my medical decision making (see chart for  details).  Clinical Course    **I have reviewed nursing notes, vital signs, and all appropriate lab and imaging results for this patient.*  Final Clinical Impressions(s) / ED Diagnoses  Pt will be placed in a sling. Pt to be treated with flexeril, decadron, and diclofenac. Pt referred to Dr Aline Brochure.   Final diagnoses:  None    New Prescriptions New Prescriptions   No medications on file     Lily Kocher, PA-C 01/01/16 1234    Duffy Bruce, MD 01/02/16 2114

## 2016-01-01 NOTE — Discharge Instructions (Signed)
Your xray is negative for fracture or dislocation. The IS noted arthritis changes in multiple areas of your right shoulder. Please use diclofenac and decadron 2 times daily with a meal. Use flexeril three times daily. This medication may cause drowsiness. Please do not drink, drive, or participate in activity that requires concentration while taking this medication.

## 2016-01-01 NOTE — ED Triage Notes (Addendum)
Pt reports pain in r shoulder and swelling over r collar bone for the past week.  Pt called ems today because she was unable to use her r arm.  EMS administered 10mg  morphine IV pta.  Pt's pain decreased from 10 to 6.  Radial pulse present.

## 2016-01-16 ENCOUNTER — Ambulatory Visit: Payer: Self-pay | Admitting: Orthopedic Surgery

## 2016-01-26 ENCOUNTER — Encounter: Payer: Self-pay | Admitting: *Deleted

## 2016-01-26 ENCOUNTER — Ambulatory Visit (INDEPENDENT_AMBULATORY_CARE_PROVIDER_SITE_OTHER): Payer: Medicare Other | Admitting: Orthopedic Surgery

## 2016-01-26 ENCOUNTER — Encounter: Payer: Self-pay | Admitting: Orthopedic Surgery

## 2016-01-26 VITALS — BP 143/94 | HR 68 | Wt 169.0 lb

## 2016-01-26 DIAGNOSIS — S43204A Unspecified dislocation of right sternoclavicular joint, initial encounter: Secondary | ICD-10-CM | POA: Diagnosis not present

## 2016-01-26 DIAGNOSIS — Z01812 Encounter for preprocedural laboratory examination: Secondary | ICD-10-CM

## 2016-01-26 DIAGNOSIS — M869 Osteomyelitis, unspecified: Secondary | ICD-10-CM | POA: Diagnosis not present

## 2016-01-26 DIAGNOSIS — M1990 Unspecified osteoarthritis, unspecified site: Secondary | ICD-10-CM

## 2016-01-26 DIAGNOSIS — R222 Localized swelling, mass and lump, trunk: Secondary | ICD-10-CM | POA: Diagnosis not present

## 2016-01-26 DIAGNOSIS — M19019 Primary osteoarthritis, unspecified shoulder: Secondary | ICD-10-CM

## 2016-01-26 NOTE — Progress Notes (Signed)
Chief Complaint  Patient presents with  . Shoulder Pain    er follow up right shoudler pain   HPI  45 years old history of IV drug abuse his latest 6 months ago presents with a one-month history of pain and swelling in her right shoulder related cause her right chest associated with swelling and prominence over the right sternoclavicular joint which she says was swollen even larger initially  She complains of painful range of motion of the right shoulder pain in the right trapezius radiates into the cervical spine but the primary issue of concern is the sternoclavicular joint. It is tender slight redness no warmth no mobility  Review of Systems  Constitutional: Negative for chills and fever.  Musculoskeletal: Positive for neck pain.    Past Medical History:  Diagnosis Date  . Anxiety   . Chronic back pain   . Chronic knee pain   . COPD (chronic obstructive pulmonary disease) ([place])   . DDD (degenerative disc disease)   . Hepatitis C infection 07/13/2014  . IV drug abuse   . Nephrolithiasis   . Panic attacks   . Polysubstance abuse    cocaine, opiates, marijuana, amphetamines, "molly," crystal meth  . Pulmonary embolism [facility])     Past Surgical History:  Procedure Laterality Date  . CESAREAN SECTION    . NOSE SURGERY    . tubes tied     Family History  Problem Relation Age of Onset  . COPD Mother   . Hypertension Father   . Heart attack Father   . Diabetes Father   . Arthritis    . Lung disease    . Cancer    . Asthma     Social History  Substance Use Topics  . Smoking status: Current Every Day Smoker    Packs/day: 0.50    Years: 25.00    Types: Cigarettes  . Smokeless tobacco: Never Used  . Alcohol use No   Current Meds  Medication Sig  . [DISCONTINUED] albuterol (PROVENTIL HFA;VENTOLIN HFA) 108 (90 BASE) MCG/ACT inhaler Inhale 2 puffs into the lungs every 6 (six) hours as needed for wheezing or shortness of breath.  . [DISCONTINUED] cyclobenzaprine (FLEXERIL)  10 MG tablet Take 1 tablet (10 mg total) by mouth 3 (three) times daily.  . [DISCONTINUED] dexamethasone (DECADRON) 4 MG tablet Take 1 tablet (4 mg total) by mouth 2 (two) times daily with a meal.  . [DISCONTINUED] diclofenac (VOLTAREN) 75 MG EC tablet Take 1 tablet (75 mg total) by mouth 2 (two) times daily.    BP (!) 143/94   Pulse 68   Wt 169 lb (76.7 kg)   BMI 31.93 kg/m   Physical Exam  Constitutional: She is oriented to person, place, and time. She appears well-developed and well-nourished. No distress.  Cardiovascular: Normal rate and intact distal pulses.   Neurological: She is alert and oriented to person, place, and time. She has normal reflexes. She exhibits normal muscle tone. Coordination normal.  Skin: Skin is warm and dry. No rash noted. She is not diaphoretic. No erythema. No pallor.  Psychiatric: She has a normal mood and affect. Her behavior is normal. Judgment and thought content normal.  Ortho Exam Right sternoclavicular joint is swollen tender slight erythema not warm to touch and there is no excessive mobility. Passive measurements in the right shoulder is normal active range of motion 120 no tenderness in the glenohumeral joint mild tenderness in the right trapezius muscle no weakness in the rotator cuff  skin shows no lacerations pulses are normal lymph nodes are negative sensation in the hand is slightly abnormal.  No gait abnormality.  Left shoulder acromioclavicular joint normal range of motion left shoulder normal   ASSESSMENT: My personal interpretation of the images:  Plain films right glenohumeral joint normal     rule out sternoclavicular joint dislocation versus infection Sternoclavicular anterior dislocation  CT scan chest  Sedimentation rate, C-reactive protein, CBC with differential   Arther Abbott, MD   CT scan chest    .meds

## 2016-02-02 ENCOUNTER — Ambulatory Visit (HOSPITAL_COMMUNITY): Payer: Medicare Other

## 2016-02-13 ENCOUNTER — Telehealth: Payer: Self-pay | Admitting: Orthopedic Surgery

## 2016-02-13 NOTE — Telephone Encounter (Signed)
Patient called, states missed CT scheduled at [facility], asking regarding re-schedule; also asking if she is to have an MRI?  Gave another ph# [phone], which has been entered in chart.  I called back to patient and provided the Centralized Scheduling phone# [phone]. Please advise.

## 2016-02-17 NOTE — Telephone Encounter (Signed)
No further advise, patient is to call and reschedule her CT with number provided

## 2016-02-21 ENCOUNTER — Inpatient Hospital Stay (HOSPITAL_COMMUNITY)
Admission: EM | Admit: 2016-02-21 | Discharge: 2016-02-28 | DRG: 507 | Disposition: A | Discharge status: Skilled Nursing Facility | Payer: Medicare Other | Attending: Internal Medicine | Admitting: Internal Medicine

## 2016-02-21 ENCOUNTER — Emergency Department (HOSPITAL_COMMUNITY): Payer: Medicare Other

## 2016-02-21 DIAGNOSIS — Z8249 Family history of ischemic heart disease and other diseases of the circulatory system: Secondary | ICD-10-CM

## 2016-02-21 DIAGNOSIS — Z6831 Body mass index (BMI) 31.0-31.9, adult: Secondary | ICD-10-CM

## 2016-02-21 DIAGNOSIS — F329 Major depressive disorder, single episode, unspecified: Secondary | ICD-10-CM | POA: Diagnosis present

## 2016-02-21 DIAGNOSIS — Z87442 Personal history of urinary calculi: Secondary | ICD-10-CM | POA: Diagnosis not present

## 2016-02-21 DIAGNOSIS — Z86711 Personal history of pulmonary embolism: Secondary | ICD-10-CM | POA: Diagnosis not present

## 2016-02-21 DIAGNOSIS — B9689 Other specified bacterial agents as the cause of diseases classified elsewhere: Secondary | ICD-10-CM | POA: Diagnosis present

## 2016-02-21 DIAGNOSIS — J441 Chronic obstructive pulmonary disease with (acute) exacerbation: Secondary | ICD-10-CM | POA: Diagnosis not present

## 2016-02-21 DIAGNOSIS — F1110 Opioid abuse, uncomplicated: Secondary | ICD-10-CM | POA: Diagnosis present

## 2016-02-21 DIAGNOSIS — J449 Chronic obstructive pulmonary disease, unspecified: Secondary | ICD-10-CM | POA: Diagnosis not present

## 2016-02-21 DIAGNOSIS — F419 Anxiety disorder, unspecified: Secondary | ICD-10-CM | POA: Diagnosis not present

## 2016-02-21 DIAGNOSIS — M009 Pyogenic arthritis, unspecified: Secondary | ICD-10-CM | POA: Diagnosis present

## 2016-02-21 DIAGNOSIS — Y92230 Patient room in hospital as the place of occurrence of the external cause: Secondary | ICD-10-CM | POA: Diagnosis present

## 2016-02-21 DIAGNOSIS — R531 Weakness: Secondary | ICD-10-CM | POA: Diagnosis not present

## 2016-02-21 DIAGNOSIS — J869 Pyothorax without fistula: Secondary | ICD-10-CM | POA: Diagnosis not present

## 2016-02-21 DIAGNOSIS — B965 Pseudomonas (aeruginosa) (mallei) (pseudomallei) as the cause of diseases classified elsewhere: Secondary | ICD-10-CM | POA: Diagnosis present

## 2016-02-21 DIAGNOSIS — D649 Anemia, unspecified: Secondary | ICD-10-CM | POA: Diagnosis present

## 2016-02-21 DIAGNOSIS — N179 Acute kidney failure, unspecified: Secondary | ICD-10-CM

## 2016-02-21 DIAGNOSIS — B1920 Unspecified viral hepatitis C without hepatic coma: Secondary | ICD-10-CM | POA: Diagnosis present

## 2016-02-21 DIAGNOSIS — T368X5A Adverse effect of other systemic antibiotics, initial encounter: Secondary | ICD-10-CM | POA: Diagnosis present

## 2016-02-21 DIAGNOSIS — I76 Septic arterial embolism: Secondary | ICD-10-CM

## 2016-02-21 DIAGNOSIS — L03116 Cellulitis of left lower limb: Secondary | ICD-10-CM | POA: Diagnosis not present

## 2016-02-21 DIAGNOSIS — Z743 Need for continuous supervision: Secondary | ICD-10-CM | POA: Diagnosis not present

## 2016-02-21 DIAGNOSIS — Z8261 Family history of arthritis: Secondary | ICD-10-CM

## 2016-02-21 DIAGNOSIS — I2690 Septic pulmonary embolism without acute cor pulmonale: Secondary | ICD-10-CM | POA: Diagnosis present

## 2016-02-21 DIAGNOSIS — F1910 Other psychoactive substance abuse, uncomplicated: Secondary | ICD-10-CM | POA: Diagnosis present

## 2016-02-21 DIAGNOSIS — R079 Chest pain, unspecified: Secondary | ICD-10-CM | POA: Diagnosis not present

## 2016-02-21 DIAGNOSIS — I330 Acute and subacute infective endocarditis: Secondary | ICD-10-CM | POA: Diagnosis not present

## 2016-02-21 DIAGNOSIS — M868X8 Other osteomyelitis, other site: Secondary | ICD-10-CM | POA: Diagnosis not present

## 2016-02-21 DIAGNOSIS — O99320 Drug use complicating pregnancy, unspecified trimester: Secondary | ICD-10-CM | POA: Diagnosis present

## 2016-02-21 DIAGNOSIS — I368 Other nonrheumatic tricuspid valve disorders: Secondary | ICD-10-CM | POA: Diagnosis present

## 2016-02-21 DIAGNOSIS — R918 Other nonspecific abnormal finding of lung field: Secondary | ICD-10-CM | POA: Diagnosis not present

## 2016-02-21 DIAGNOSIS — Z833 Family history of diabetes mellitus: Secondary | ICD-10-CM | POA: Diagnosis not present

## 2016-02-21 DIAGNOSIS — F418 Other specified anxiety disorders: Secondary | ICD-10-CM | POA: Diagnosis not present

## 2016-02-21 DIAGNOSIS — R279 Unspecified lack of coordination: Secondary | ICD-10-CM | POA: Diagnosis not present

## 2016-02-21 DIAGNOSIS — F32A Depression, unspecified: Secondary | ICD-10-CM | POA: Diagnosis present

## 2016-02-21 DIAGNOSIS — L0291 Cutaneous abscess, unspecified: Secondary | ICD-10-CM | POA: Diagnosis not present

## 2016-02-21 DIAGNOSIS — F17210 Nicotine dependence, cigarettes, uncomplicated: Secondary | ICD-10-CM | POA: Diagnosis present

## 2016-02-21 DIAGNOSIS — E876 Hypokalemia: Secondary | ICD-10-CM | POA: Diagnosis not present

## 2016-02-21 DIAGNOSIS — R7881 Bacteremia: Secondary | ICD-10-CM | POA: Diagnosis present

## 2016-02-21 DIAGNOSIS — I749 Embolism and thrombosis of unspecified artery: Secondary | ICD-10-CM | POA: Diagnosis not present

## 2016-02-21 DIAGNOSIS — I079 Rheumatic tricuspid valve disease, unspecified: Secondary | ICD-10-CM | POA: Diagnosis present

## 2016-02-21 DIAGNOSIS — L02413 Cutaneous abscess of right upper limb: Secondary | ICD-10-CM | POA: Diagnosis not present

## 2016-02-21 DIAGNOSIS — M00811 Arthritis due to other bacteria, right shoulder: Principal | ICD-10-CM | POA: Diagnosis present

## 2016-02-21 DIAGNOSIS — E669 Obesity, unspecified: Secondary | ICD-10-CM | POA: Diagnosis present

## 2016-02-21 DIAGNOSIS — I38 Endocarditis, valve unspecified: Secondary | ICD-10-CM | POA: Diagnosis not present

## 2016-02-21 HISTORY — DX: Pyogenic arthritis, unspecified: M00.9

## 2016-02-21 LAB — COMPREHENSIVE METABOLIC PANEL
ALT: 12 U/L — ABNORMAL LOW (ref 14–54)
AST: 12 U/L — ABNORMAL LOW (ref 15–41)
Albumin: 2.7 g/dL — ABNORMAL LOW (ref 3.5–5.0)
Alkaline Phosphatase: 73 U/L (ref 38–126)
Anion gap: 7 (ref 5–15)
BUN: 7 mg/dL (ref 6–20)
CO2: 26 mmol/L (ref 22–32)
Calcium: 8.7 mg/dL — ABNORMAL LOW (ref 8.9–10.3)
Chloride: 103 mmol/L (ref 101–111)
Creatinine, Ser: 0.59 mg/dL (ref 0.44–1.00)
GFR calc Af Amer: >60 mL/min (ref >60)
GFR calc non Af Amer: >60 mL/min (ref >60)
Glucose, Bld: 88 mg/dL (ref 65–99)
Potassium: 3.8 mmol/L (ref 3.5–5.1)
Sodium: 136 mmol/L (ref 135–145)
Total Bilirubin: 0.7 mg/dL (ref 0.3–1.2)
Total Protein: 7.5 g/dL (ref 6.5–8.1)

## 2016-02-21 LAB — CBC WITH DIFFERENTIAL/PLATELET
Basophils Absolute: 0 10*3/uL (ref 0.0–0.1)
Basophils Relative: 0 %
Eosinophils Absolute: 0.1 10*3/uL (ref 0.0–0.7)
Eosinophils Relative: 1 %
HCT: 31 % — ABNORMAL LOW (ref 36.0–46.0)
Hemoglobin: 10.1 g/dL — ABNORMAL LOW (ref 12.0–15.0)
Lymphocytes Relative: 11 %
Lymphs Abs: 1.2 10*3/uL (ref 0.7–4.0)
MCH: 26 pg (ref 26.0–34.0)
MCHC: 32.6 g/dL (ref 30.0–36.0)
MCV: 79.7 fL (ref 78.0–100.0)
Monocytes Absolute: 0.2 10*3/uL (ref 0.1–1.0)
Monocytes Relative: 2 %
Neutro Abs: 9.4 10*3/uL — ABNORMAL HIGH (ref 1.7–7.7)
Neutrophils Relative %: 86 %
Platelets: 433 10*3/uL — ABNORMAL HIGH (ref 150–400)
RBC: 3.89 MIL/uL (ref 3.87–5.11)
RDW: 16 % — ABNORMAL HIGH (ref 11.5–15.5)
WBC: 10.9 10*3/uL — ABNORMAL HIGH (ref 4.0–10.5)

## 2016-02-21 LAB — URINALYSIS, ROUTINE W REFLEX MICROSCOPIC
Bilirubin Urine: NEGATIVE
Glucose, UA: NEGATIVE mg/dL
Hgb urine dipstick: NEGATIVE
Ketones, ur: NEGATIVE mg/dL
Leukocytes, UA: NEGATIVE
Nitrite: NEGATIVE
Protein, ur: NEGATIVE mg/dL
Specific Gravity, Urine: <1.005 — ABNORMAL LOW (ref 1.005–1.030)
pH: 7.5 (ref 5.0–8.0)

## 2016-02-21 LAB — LACTIC ACID, PLASMA: Lactic Acid, Venous: 0.7 mmol/L (ref 0.5–1.9)

## 2016-02-21 MED ORDER — VANCOMYCIN HCL IN DEXTROSE 1-5 GM/200ML-% IV SOLN
1000.0000 mg | Freq: Once | INTRAVENOUS | Status: AC
Start: 2016-02-21 — End: 2016-02-21
  Administered 2016-02-21: 1000 mg via INTRAVENOUS
  Filled 2016-02-21: qty 200

## 2016-02-21 MED ORDER — VANCOMYCIN HCL IN DEXTROSE 750-5 MG/150ML-% IV SOLN
750.0000 mg | Freq: Two times a day (BID) | INTRAVENOUS | Status: DC
  Filled 2016-02-21: qty 150

## 2016-02-21 MED ORDER — CLONIDINE HCL 0.1 MG PO TABS: 0.1000 mg | ORAL_TABLET | Freq: Three times a day (TID) | ORAL | Status: DC | PRN

## 2016-02-21 MED ORDER — OXYCODONE-ACETAMINOPHEN 5-325 MG PO TABS
2.0000 | ORAL_TABLET | Freq: Once | ORAL | Status: AC
  Administered 2016-02-21: 2 via ORAL
  Filled 2016-02-21: qty 2

## 2016-02-21 MED ORDER — LOPERAMIDE HCL 2 MG PO CAPS: 2.0000 mg | ORAL_CAPSULE | ORAL | Status: DC | PRN

## 2016-02-21 MED ORDER — CLONIDINE HCL 0.1 MG PO TABS
0.1000 mg | ORAL_TABLET | Freq: Three times a day (TID) | ORAL | Status: DC | PRN
  Administered 2016-02-22 – 2016-02-23 (×2): 0.1 mg via ORAL
  Filled 2016-02-21 (×3): qty 1

## 2016-02-21 MED ORDER — SODIUM CHLORIDE 0.9 % IV SOLN
INTRAVENOUS | Status: DC
  Administered 2016-02-21: 21:00:00 via INTRAVENOUS

## 2016-02-21 MED ORDER — KETOROLAC TROMETHAMINE 30 MG/ML IJ SOLN
30.0000 mg | Freq: Four times a day (QID) | INTRAMUSCULAR | Status: DC | PRN
  Administered 2016-02-21 – 2016-02-23 (×4): 30 mg via INTRAVENOUS
  Filled 2016-02-21 (×4): qty 1

## 2016-02-21 MED ORDER — SODIUM CHLORIDE 0.9% FLUSH
10.0000 mL | INTRAVENOUS | Status: DC | PRN
  Administered 2016-02-22 – 2016-02-23 (×3): 10 mL
  Administered 2016-02-28: 20 mL
  Administered 2016-02-28 (×2): 10 mL
  Filled 2016-02-21 (×6): qty 40

## 2016-02-21 MED ORDER — SODIUM CHLORIDE 0.9 % IV SOLN
Freq: Once | INTRAVENOUS | Status: AC
  Administered 2016-02-21: 15:00:00 via INTRAVENOUS

## 2016-02-21 MED ORDER — PIPERACILLIN-TAZOBACTAM 3.375 G IVPB
3.3750 g | Freq: Three times a day (TID) | INTRAVENOUS | Status: DC
  Administered 2016-02-21 – 2016-02-22 (×3): 3.375 g via INTRAVENOUS
  Filled 2016-02-21 (×5): qty 50

## 2016-02-21 MED ORDER — ACETAMINOPHEN 325 MG PO TABS
650.0000 mg | ORAL_TABLET | Freq: Four times a day (QID) | ORAL | Status: DC | PRN
Start: 2016-02-21 — End: 2016-02-28

## 2016-02-21 MED ORDER — ACETAMINOPHEN 650 MG RE SUPP
650.0000 mg | Freq: Four times a day (QID) | RECTAL | Status: DC | PRN
Start: 2016-02-21 — End: 2016-02-28

## 2016-02-21 MED ORDER — ONDANSETRON HCL 4 MG/2ML IJ SOLN
4.0000 mg | Freq: Four times a day (QID) | INTRAMUSCULAR | Status: DC | PRN
  Administered 2016-02-25 – 2016-02-27 (×2): 4 mg via INTRAVENOUS
  Filled 2016-02-21 (×2): qty 2

## 2016-02-21 MED ORDER — ONDANSETRON HCL 4 MG PO TABS: 4.0000 mg | ORAL_TABLET | Freq: Four times a day (QID) | ORAL | Status: DC | PRN

## 2016-02-21 MED ORDER — PIPERACILLIN-TAZOBACTAM 3.375 G IVPB
3.3750 g | Freq: Once | INTRAVENOUS | Status: AC
  Administered 2016-02-21: 3.375 g via INTRAVENOUS
  Filled 2016-02-21: qty 50

## 2016-02-21 MED ORDER — DICYCLOMINE HCL 10 MG/ML IM SOLN
20.0000 mg | Freq: Once | INTRAMUSCULAR | Status: AC
  Administered 2016-02-21: 20 mg via INTRAMUSCULAR
  Filled 2016-02-21: qty 2

## 2016-02-21 MED ORDER — VANCOMYCIN HCL IN DEXTROSE 750-5 MG/150ML-% IV SOLN
750.0000 mg | Freq: Three times a day (TID) | INTRAVENOUS | Status: DC
  Administered 2016-02-21 – 2016-02-23 (×5): 750 mg via INTRAVENOUS
  Filled 2016-02-21 (×7): qty 150

## 2016-02-21 MED ORDER — POLYETHYLENE GLYCOL 3350 17 G PO PACK: 17.0000 g | PACK | Freq: Every day | ORAL | Status: DC | PRN

## 2016-02-21 NOTE — ED Triage Notes (Signed)
Pt has an abscess on her upper chest that started 3 months ago.  Pt states that she is an IV drug user.

## 2016-02-21 NOTE — Progress Notes (Signed)
Pharmacy Antibiotic Note  IVETTE OGANDO is a 45 y.o. female admitted on 02/21/2016 with cellulitis.  Pharmacy has been consulted for vancomycin and zosyn dosing. 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 It also shows septic emboli in the lungs and an abscess over the sternoclavicular joint.   Plan: Vancomycin 750mg  IV every 8 hours.  Goal trough 15-20 mcg/mL. Zosyn 3.375g IV q8h (4 hour infusion).  Height: 5\' 1"  (154.9 cm) Weight: 167 lb 12.3 oz (76.1 kg) IBW/kg (Calculated) : 47.8  Temp (24hrs), Avg:98.3 F (36.8 C), Min:98.1 F (36.7 C), Max:98.4 F (36.9 C)   Recent Labs Lab 02/21/16 1415 02/21/16 1417  WBC 10.9*  --   CREATININE 0.59  --   LATICACIDVEN  --  0.7    Estimated Creatinine Clearance: 83.7 mL/min (by C-G formula based on SCr of 0.59 mg/dL).    Allergies  Allergen Reactions  . Doxycycline Other (See Comments)    burns  . Mango Flavor Hives and Swelling  . Pyridium [Phenazopyridine Hcl] Other (See Comments)    Irritation to skin  . Tramadol Nausea And Vomiting    Antimicrobials this admission: Vancomycin 11/11 >>  Zosyn 11/11 >>   Thank you for allowing pharmacy to be a part of this patient's care.  Jodean Lima Afsana Liera 02/21/2016 7:53 PM

## 2016-02-21 NOTE — ED Notes (Signed)
Called  [facility] to transfer Pt to [facility]. (rm 6E23C).

## 2016-02-21 NOTE — ED Notes (Signed)
Ginger with [facility] called back with an ETA.  Kathlee Nations will be here about 4:30p.

## 2016-02-21 NOTE — ED Notes (Signed)
Pt hard IV stick. Pt is a IV drug user with multiple sites of access and scarring. This nurse made attempt at IV in right hand that was unsuccessful. I made another attempt in left ac with advancement of the catheter half way. This area will flush with no difficulties. I attempted another IV in her upper bicep of a 24G. This area will flush with no difficulties. Saline freely dripping.

## 2016-02-21 NOTE — H&P (Signed)
History and Physical  [name] [id number] DOB: [birth year] DOA: 02/21/2016  Referring physician: Dr Venora Maples, ED physician PCP: [name] Patient  Outpatient Specialists: none  Chief Complaint: Chest pain, abscess on chest  HPI: [name] is a 45 y.o. female with a history of IV drug use (primarily heroin), hepatitis C. Patient has swollen area above her sternoclavicular joint with pain that radiates into her shoulder. Patient had been seen by Dr. Aline Brochure for complaints of some swelling and prominence over the right sternoclavicular joint on 01/26/16. Dr. Aline Brochure ordered a CT, however the patient never followed up for that. She continues to have pain over the past several weeks, but suddenly had redness and extreme swelling over the past 24 hours. Ibuprofen helps mildly with pain. Patient has been using heroin IV - does about 30 g a day. Last drug use was 10 AM. Denies fevers or chills.  Emergency Department Course:  CT shows septic arthritis of the sternoclavicular joint on the right. It also shows septic emboli in the lungs and an abscess over the sternoclavicular joint. Patient was given vancomycin and Zosyn. Dr. Princess Perna of CT surgery consulted, who will see the patient with the patient arrives [facility]  Review of Systems:   Pt denies any fevers, chills, nausea, vomiting, diarrhea, constipation, abdominal pain, shortness of breath, dyspnea on exertion, orthopnea, cough, wheezing, palpitations, headache, vision changes, lightheadedness, dizziness, melena, rectal bleeding.  Review of systems are otherwise negative  Past Medical History:  Diagnosis Date  . Anxiety   . Chronic back pain   . Chronic knee pain   . COPD (chronic obstructive pulmonary disease) ([place])   . DDD (degenerative disc disease)   . Hepatitis C infection 07/13/2014  . IV drug abuse   . Nephrolithiasis   . Panic attacks   . Polysubstance abuse    cocaine, opiates, marijuana, amphetamines,  "molly," crystal meth  . Pulmonary embolism [facility])    Past Surgical History:  Procedure Laterality Date  . CESAREAN SECTION    . NOSE SURGERY    . tubes tied     Social History:  reports that she has been smoking Cigarettes.  She has a 12.50 pack-year smoking history. She has never used smokeless tobacco. She reports that she uses drugs, including IV, Cocaine, Marijuana, and Methamphetamines. She reports that she does not drink alcohol. Patient lives at [facility]  . Doxycycline Other (See Comments)    burns  . Mango Flavor Hives and Swelling  . Pyridium [Phenazopyridine Hcl] Other (See Comments)    Irritation to skin  . Tramadol Nausea And Vomiting    Family History  Problem Relation Age of Onset  . COPD Mother   . Hypertension Father   . Heart attack Father   . Diabetes Father   . Arthritis    . Lung disease    . Cancer    . Asthma        Prior to Admission medications   Not on File    Physical Exam: BP 125/73 (BP Location: Right Arm)   Pulse 75   Temp 98.1 F (36.7 C) (Oral)   Resp 18   Ht 5' 1.5" (1.562 m)   Wt 63.5 kg (140 lb)   LMP 01/21/2016 (Approximate) Comment: tubal ligation per patient.shielded  SpO2 95%   BMI 26.02 kg/m   General:  Middle-aged Caucasian female. Awake and alert and oriented x3. No acute cardiopulmonary distress.  HEENT: Normocephalic  atraumatic.  Right and left ears normal in appearance.  Pupils equal, round, reactive to light. Extraocular muscles are intact. Sclerae anicteric and noninjected.  Moist mucosal membranes. No mucosal lesions.  Neck: Neck supple without lymphadenopathy. No carotid bruits. No masses palpated.  Cardiovascular: Regular rate with normal S1-S2 sounds. No murmurs, rubs, gallops auscultated. No JVD.  Respiratory: Good respiratory effort with no wheezes, rales, rhonchi. Lungs clear to auscultation bilaterally.  No accessory muscle use. Abdomen: Soft, nontender, nondistended. Active bowel  sounds. No masses or hepatosplenomegaly  Skin: 5 cm hard, erythema, warm area over the right sternoclavicular joint. Erythema extends past this point.  Dry, warm to touch. 2+ dorsalis pedis and radial pulses. Musculoskeletal: No calf or leg pain. All major joints not erythematous nontender.  No upper or lower joint deformation.  Good ROM.  No contractures  Psychiatric: Intact judgment and insight. Pleasant and cooperative. Neurologic: No focal neurological deficits. Strength is 5/5 and symmetric in upper and lower extremities.  Cranial nerves II through XII are grossly intact.           Labs on Admission: I have personally reviewed following labs and imaging studies  CBC:  Recent Labs Lab 02/21/16 1415  WBC 10.9*  NEUTROABS 9.4*  HGB 10.1*  HCT 31.0*  MCV 79.7  PLT [id number]*   Basic Metabolic Panel:  Recent Labs Lab 02/21/16 1415  NA 136  K 3.8  CL 103  CO2 26  GLUCOSE 88  BUN 7  CREATININE 0.59  CALCIUM 8.7*   GFR: Estimated Creatinine Clearance: 77.6 mL/min (by C-G formula based on SCr of 0.59 mg/dL). Liver Function Tests:  Recent Labs Lab 02/21/16 1415  AST 12*  ALT 12*  ALKPHOS 73  BILITOT 0.7  PROT 7.5  ALBUMIN 2.7*   No results for input(s): LIPASE, AMYLASE in the last 168 hours. No results for input(s): AMMONIA in the last 168 hours. Coagulation Profile: No results for input(s): INR, PROTIME in the last 168 hours. Cardiac Enzymes: No results for input(s): CKTOTAL, CKMB, CKMBINDEX, TROPONINI in the last 168 hours. BNP (last 3 results) No results for input(s): PROBNP in the last 8760 hours. HbA1C: No results for input(s): HGBA1C in the last 72 hours. CBG: No results for input(s): GLUCAP in the last 168 hours. Lipid Profile: No results for input(s): CHOL, HDL, LDLCALC, TRIG, CHOLHDL, LDLDIRECT in the last 72 hours. Thyroid Function Tests: No results for input(s): TSH, T4TOTAL, FREET4, T3FREE, THYROIDAB in the last 72 hours. Anemia Panel: No results  for input(s): VITAMINB12, FOLATE, FERRITIN, TIBC, IRON, RETICCTPCT in the last 72 hours. Urine analysis:    Component Value Date/Time   COLORURINE YELLOW 02/21/2016 1600   APPEARANCEUR CLEAR 02/21/2016 1600   LABSPEC <1.005 (L) 02/21/2016 1600   PHURINE 7.5 02/21/2016 1600   GLUCOSEU NEGATIVE 02/21/2016 1600   HGBUR NEGATIVE 02/21/2016 1600   BILIRUBINUR NEGATIVE 02/21/2016 1600   KETONESUR NEGATIVE 02/21/2016 1600   PROTEINUR NEGATIVE 02/21/2016 1600   UROBILINOGEN 0.2 10/18/2014 0100   NITRITE NEGATIVE 02/21/2016 1600   LEUKOCYTESUR NEGATIVE 02/21/2016 1600   Sepsis Labs: @LABRCNTIP (procalcitonin:4,lacticidven:4) )No results found for this or any previous visit (from the past 240 hour(s)).   Radiological Exams on Admission: Ct Chest Wo Contrast  Result Date: 02/21/2016 CLINICAL DATA:  45 year old female with chest growth and pain. IV drug abuse. EXAM: CT CHEST WITHOUT CONTRAST TECHNIQUE: Multidetector CT imaging of the chest was performed following the standard protocol without IV contrast. COMPARISON:  10/08/2005 chest CT.  05/03/2015 chest radiograph.  FINDINGS: Cardiovascular: Normal heart size. No significant pericardial fluid/thickening. Great vessels are normal in course and caliber. Mediastinum/Nodes: No discrete thyroid nodules. Unremarkable esophagus. Mild right axillary adenopathy measuring up to 1.1 cm (series 3/image 60), new since 10/08/2005. No left axillary adenopathy. Right supraclavicular adenopathy measuring up to 1.1 cm (series 3/image 28), new. Anterior right mediastinal prevascular adenopathy measuring up to 1.0 cm (series 3/ image 52), new. No additional pathologically enlarged mediastinal or gross hilar nodes on this noncontrast study. Lungs/Pleura: No pneumothorax. No pleural effusion. Poorly marginated 1.7 x 1.0 cm peripheral basilar right upper lobe pulmonary nodule (series 4/ image 77) with ground-glass halo, new since 2007. Poorly marginated 6 mm solid right  lower lobe pulmonary nodule (series 4/ image 118), new. Apical left upper lobe 2 mm solid pulmonary nodule (series 4/ image 37) is stable since 2007 and considered benign. No additional significant pulmonary nodules. Upper abdomen: Unremarkable. Musculoskeletal: There is extensive erosive change involving the right sternoclavicular and anterior right first costo manubrial joints with associated large joint effusion causing local mass effect in the right anterior upper mediastinum. The there is an overlying 3.4 x 2.3 x 3.1 cm abscess in the medial ventral right upper chest wall involving the deep subcutaneous soft tissues and medial right pectoral muscle (series 3/ image 37). IMPRESSION: 1. Severe septic arthritis of the right sternoclavicular and right first costomanubrial joints with associated large joint effusion. Orthopedic surgery consultation advised. 2. Overlying 3.4 x 2.3 x 3.1 cm abscess in the medial ventral upper right chest wall involving the deep subcutaneous soft tissues and medial right pectoral muscle. 3. Poorly marginated 1.7 cm basilar right upper lobe and 0.6 cm right lower lobe pulmonary nodules, new since 2007, suspicious for septic emboli given the history of IV drug abuse. Follow-up post treatment chest CT is recommended in 3 months. 4. Mild right axillary, right supraclavicular and anterior right upper mediastinal lymphadenopathy, nonspecific, probably reactive. These nodes can also be reassessed on follow-up chest CT in 3 months. Electronically Signed   By: Ilona Sorrel M.D.   On: 02/21/2016 13:41     Assessment/Plan: Principal Problem:   Abscess of chest [facility]) Active Problems:   IV drug abuse   Opioid abuse   Pulmonary embolism, septic (HCC)   Septic arthritis of right sternoclavicular joint [facility])    This patient was discussed with the ED physician, including pertinent vitals, physical exam findings, labs, and imaging.  We also discussed care given by the ED provider.  #1  abscess chest #2 septic arthritis of right sternoclavicular joint  Admit to [facility]  Continue IV antibiotics  CT surgery to see - appreciate their input  We'll keep nothing by mouth for now until determination made of surgical status  Blood cultures obtained #3 septic pulmonary embolism  Echo #4 IV drug use #5 opiate abuse  We'll watch for withdrawal  Bentyl, Imodium, clonidine when necessary  DVT prophylaxis:  SCDs for now Consultants:  CT surgery Code Status:  Full code Family Communication:  None  Disposition Plan:  Indeterminate for now   Truett Mainland, DO [facility] Pager [phone]  If 7PM-7AM, please contact night-coverage [email or web address] Password [email or web address]

## 2016-02-21 NOTE — ED Notes (Signed)
Pt states that she last used heroin at 0100 today.

## 2016-02-21 NOTE — ED Notes (Signed)
Pt stable and ready for transport to [facility]. [facility] arrived for transport.

## 2016-02-21 NOTE — ED Provider Notes (Signed)
[facility] DEPT Provider Note   CSN: [id number] Arrival date & time: 02/21/16  1115     History   Chief Complaint Chief Complaint  Patient presents with  . Abscess    HPI [name] is a 45 y.o. female.  The history is provided by the patient. No language interpreter was used.  Abscess  Location:  Torso Torso abscess location:  R chest Size:  15x12 Abscess quality: redness and warmth   Duration:  3 months Progression:  Worsening Chronicity:  Recurrent Context: injected drug use   Context: not diabetes   Relieved by:  Nothing Worsened by:  Nothing Ineffective treatments:  None tried Associated symptoms: fever   Pt complains of a swollen area  On her chest.  Pt has pain into her shoulder. Pt was seen by Dr. Aline Brochure on 10/16.  He ordered a ct scan but pt did not follow up.   Pt reports area is increasing in size.   Past Medical History:  Diagnosis Date  . Anxiety   . Chronic back pain   . Chronic knee pain   . COPD (chronic obstructive pulmonary disease) ([place])   . DDD (degenerative disc disease)   . Hepatitis C infection 07/13/2014  . IV drug abuse   . Nephrolithiasis   . Panic attacks   . Polysubstance abuse    cocaine, opiates, marijuana, amphetamines, "molly," crystal meth  . Pulmonary embolism [facility])     Patient Active Problem List   Diagnosis Date Noted  . Cellulitis of left lower extremity   . COPD bronchitis 10/03/2014  . Cellulitis of leg, left 10/01/2014  . Hypokalemia 10/01/2014  . Hyponatremia 10/01/2014  . Abscess and cellulitis   . Abscess of forearm, right   . Hepatitis C infection 07/13/2014  . Cellulitis and abscess 07/12/2014  . Protein-calorie malnutrition, severe ([place]) 07/12/2014  . Abscess of forearm, left   . IV drug abuse   . Opioid dependence with opioid-induced mood disorder ([place])   . Opioid dependence ([place]) 06/17/2014  . Substance induced mood disorder ([place]) 06/17/2014  . Polysubstance abuse 06/16/2014    Class: Chronic   . Suicide ideation 06/16/2014    Class: Acute  . PFS (patellofemoral syndrome) 06/06/2012  . Obesity, unspecified 06/06/2012    Past Surgical History:  Procedure Laterality Date  . CESAREAN SECTION    . NOSE SURGERY    . tubes tied      OB History    Gravida Para Term Preterm AB Living   2 2 2     2    SAB TAB Ectopic Multiple Live Births                   Home Medications    Prior to Admission medications   Not on File    Family History Family History  Problem Relation Age of Onset  . COPD Mother   . Hypertension Father   . Heart attack Father   . Diabetes Father   . Arthritis    . Lung disease    . Cancer    . Asthma      Social History Social History  Substance Use Topics  . Smoking status: Current Every Day Smoker    Packs/day: 0.50    Years: 25.00    Types: Cigarettes  . Smokeless tobacco: Never Used  . Alcohol use No     Allergies   Doxycycline; Mango flavor; Pyridium [phenazopyridine hcl]; and Tramadol   Review of Systems Review  of Systems  Constitutional: Positive for fever.  All other systems reviewed and are negative.    Physical Exam Updated Vital Signs BP 127/79 (BP Location: Left Arm)   Pulse 74   Temp 98.1 F (36.7 C) (Oral)   Resp 16   Ht 5' 1.5" (1.562 m)   Wt 63.5 kg   LMP 01/21/2016 (Approximate) Comment: tubal ligation per patient.shielded  SpO2 94%   BMI 26.02 kg/m   Physical Exam  Constitutional: She is oriented to person, place, and time. She appears well-developed and well-nourished.  HENT:  Head: Normocephalic.  Right Ear: External ear normal.  Eyes: EOM are normal.  Neck: Normal range of motion.  Cardiovascular: Normal rate.   Pulmonary/Chest: Effort normal and breath sounds normal.  Abdominal: Soft. She exhibits no distension.  Musculoskeletal: She exhibits tenderness.  12x15 cm mass mid upper chest at sternum and clavicle.  Firm to palpation.    Neurological: She is alert and oriented to person,  place, and time.  Psychiatric: She has a normal mood and affect.  Nursing note and vitals reviewed.    ED Treatments / Results  Labs (all labs ordered are listed, but only abnormal results are displayed) Labs Reviewed  CBC WITH DIFFERENTIAL/PLATELET - Abnormal; Notable for the following:       Result Value   WBC 10.9 (*)    Hemoglobin 10.1 (*)    HCT 31.0 (*)    RDW 16.0 (*)    Platelets 433 (*)    Neutro Abs 9.4 (*)    All other components within normal limits  COMPREHENSIVE METABOLIC PANEL - Abnormal; Notable for the following:    Calcium 8.7 (*)    Albumin 2.7 (*)    AST 12 (*)    ALT 12 (*)    All other components within normal limits  CULTURE, BLOOD (ROUTINE X 2)  CULTURE, BLOOD (ROUTINE X 2)  LACTIC ACID, PLASMA  URINALYSIS, ROUTINE W REFLEX MICROSCOPIC (NOT AT [facility])    EKG  EKG Interpretation None       Radiology Ct Chest Wo Contrast  Result Date: 02/21/2016 CLINICAL DATA:  45 year old female with chest growth and pain. IV drug abuse. EXAM: CT CHEST WITHOUT CONTRAST TECHNIQUE: Multidetector CT imaging of the chest was performed following the standard protocol without IV contrast. COMPARISON:  10/08/2005 chest CT.  05/03/2015 chest radiograph. FINDINGS: Cardiovascular: Normal heart size. No significant pericardial fluid/thickening. Great vessels are normal in course and caliber. Mediastinum/Nodes: No discrete thyroid nodules. Unremarkable esophagus. Mild right axillary adenopathy measuring up to 1.1 cm (series 3/image 60), new since 10/08/2005. No left axillary adenopathy. Right supraclavicular adenopathy measuring up to 1.1 cm (series 3/image 28), new. Anterior right mediastinal prevascular adenopathy measuring up to 1.0 cm (series 3/ image 52), new. No additional pathologically enlarged mediastinal or gross hilar nodes on this noncontrast study. Lungs/Pleura: No pneumothorax. No pleural effusion. Poorly marginated 1.7 x 1.0 cm peripheral basilar right upper lobe  pulmonary nodule (series 4/ image 77) with ground-glass halo, new since 2007. Poorly marginated 6 mm solid right lower lobe pulmonary nodule (series 4/ image 118), new. Apical left upper lobe 2 mm solid pulmonary nodule (series 4/ image 37) is stable since 2007 and considered benign. No additional significant pulmonary nodules. Upper abdomen: Unremarkable. Musculoskeletal: There is extensive erosive change involving the right sternoclavicular and anterior right first costo manubrial joints with associated large joint effusion causing local mass effect in the right anterior upper mediastinum. The there is an overlying 3.4 x  2.3 x 3.1 cm abscess in the medial ventral right upper chest wall involving the deep subcutaneous soft tissues and medial right pectoral muscle (series 3/ image 37). IMPRESSION: 1. Severe septic arthritis of the right sternoclavicular and right first costomanubrial joints with associated large joint effusion. Orthopedic surgery consultation advised. 2. Overlying 3.4 x 2.3 x 3.1 cm abscess in the medial ventral upper right chest wall involving the deep subcutaneous soft tissues and medial right pectoral muscle. 3. Poorly marginated 1.7 cm basilar right upper lobe and 0.6 cm right lower lobe pulmonary nodules, new since 2007, suspicious for septic emboli given the history of IV drug abuse. Follow-up post treatment chest CT is recommended in 3 months. 4. Mild right axillary, right supraclavicular and anterior right upper mediastinal lymphadenopathy, nonspecific, probably reactive. These nodes can also be reassessed on follow-up chest CT in 3 months. Electronically Signed   By: Ilona Sorrel M.D.   On: 02/21/2016 13:41    Procedures Procedures (including critical care time)  Medications Ordered in ED Medications - No data to display   Initial Impression / Assessment and Plan / ED Course  I have reviewed the triage vital signs and the nursing notes.  Pertinent labs & imaging results that  were available during my care of the patient were reviewed by me and considered in my medical decision making (see chart for details).  Clinical Course   Ct scan shows septic arthritis right sternoclavicular and right mandibular joint.  Chest wall abscess and multiple septic emboli.    Dr. Venora Maples in to see and examine.  I spoke to Dr. Aline Brochure.  He reports pt needs to be at a facility with CV surgery available.   Blood cultures and labs ordered.   Iv access obtained.   Will try for larger access.   Dr. Venora Maples advised speak to hospitalist to have pt transferred to [facility] hospitalist where she can have multi service care including infectious disease and possible thoracic surgery.   Final Clinical Impressions(s) / ED Diagnoses   Final diagnoses:  Pyogenic arthritis of right shoulder region, due to unspecified organism [facility])  Septic embolism ([place])    New Prescriptions New Prescriptions   No medications on file   I spoke to Dr. Nehemiah Settle who will have pt transferred to [facility].  He requested that I speak to Dr. Princess Perna.   Dr. Princess Perna advised and requested call when [facility] arrives [facility] and he will consult.   Fransico Meadow, PA-C 02/21/16 [place], PA-C 02/21/16 [place], PA-C 02/21/16 [place], MD 02/22/16 [phone]

## 2016-02-22 ENCOUNTER — Encounter (HOSPITAL_COMMUNITY)
Admission: EM | Disposition: A | Discharge status: Skilled Nursing Facility | Payer: Self-pay | Source: Home / Self Care | Attending: Internal Medicine

## 2016-02-22 ENCOUNTER — Encounter (HOSPITAL_COMMUNITY): Payer: Self-pay | Admitting: General Practice

## 2016-02-22 ENCOUNTER — Inpatient Hospital Stay (HOSPITAL_COMMUNITY): Payer: Medicare Other | Admitting: Anesthesiology

## 2016-02-22 DIAGNOSIS — F1110 Opioid abuse, uncomplicated: Secondary | ICD-10-CM

## 2016-02-22 DIAGNOSIS — L02413 Cutaneous abscess of right upper limb: Secondary | ICD-10-CM

## 2016-02-22 DIAGNOSIS — F17210 Nicotine dependence, cigarettes, uncomplicated: Secondary | ICD-10-CM

## 2016-02-22 DIAGNOSIS — Z888 Allergy status to other drugs, medicaments and biological substances status: Secondary | ICD-10-CM

## 2016-02-22 DIAGNOSIS — F1910 Other psychoactive substance abuse, uncomplicated: Secondary | ICD-10-CM

## 2016-02-22 DIAGNOSIS — Z9889 Other specified postprocedural states: Secondary | ICD-10-CM

## 2016-02-22 DIAGNOSIS — F419 Anxiety disorder, unspecified: Secondary | ICD-10-CM | POA: Diagnosis present

## 2016-02-22 DIAGNOSIS — R918 Other nonspecific abnormal finding of lung field: Secondary | ICD-10-CM

## 2016-02-22 DIAGNOSIS — Z886 Allergy status to analgesic agent status: Secondary | ICD-10-CM

## 2016-02-22 DIAGNOSIS — L0291 Cutaneous abscess, unspecified: Secondary | ICD-10-CM

## 2016-02-22 DIAGNOSIS — J869 Pyothorax without fistula: Secondary | ICD-10-CM

## 2016-02-22 DIAGNOSIS — Z881 Allergy status to other antibiotic agents status: Secondary | ICD-10-CM

## 2016-02-22 DIAGNOSIS — Z884 Allergy status to anesthetic agent status: Secondary | ICD-10-CM

## 2016-02-22 DIAGNOSIS — Z91018 Allergy to other foods: Secondary | ICD-10-CM

## 2016-02-22 HISTORY — PX: STERNAL WOUND DEBRIDEMENT: SHX1058

## 2016-02-22 LAB — CBC
HCT: 28.1 % — ABNORMAL LOW (ref 36.0–46.0)
Hemoglobin: 9 g/dL — ABNORMAL LOW (ref 12.0–15.0)
MCH: 25.2 pg — ABNORMAL LOW (ref 26.0–34.0)
MCHC: 32 g/dL (ref 30.0–36.0)
MCV: 78.7 fL (ref 78.0–100.0)
Platelets: 363 10*3/uL (ref 150–400)
RBC: 3.57 MIL/uL — ABNORMAL LOW (ref 3.87–5.11)
RDW: 16.4 % — ABNORMAL HIGH (ref 11.5–15.5)
WBC: 7.8 10*3/uL (ref 4.0–10.5)

## 2016-02-22 LAB — TYPE AND SCREEN
ABO/RH(D): A POS
Antibody Screen: NEGATIVE

## 2016-02-22 LAB — BASIC METABOLIC PANEL
Anion gap: 6 (ref 5–15)
BUN: 7 mg/dL (ref 6–20)
CO2: 26 mmol/L (ref 22–32)
Calcium: 8.6 mg/dL — ABNORMAL LOW (ref 8.9–10.3)
Chloride: 106 mmol/L (ref 101–111)
Creatinine, Ser: 0.8 mg/dL (ref 0.44–1.00)
GFR calc Af Amer: >60 mL/min (ref >60)
GFR calc non Af Amer: >60 mL/min (ref >60)
Glucose, Bld: 115 mg/dL — ABNORMAL HIGH (ref 65–99)
Potassium: 3.8 mmol/L (ref 3.5–5.1)
Sodium: 138 mmol/L (ref 135–145)

## 2016-02-22 LAB — ABO/RH: ABO/RH(D): A POS

## 2016-02-22 LAB — APTT: aPTT: 46 seconds — ABNORMAL HIGH (ref 24–36)

## 2016-02-22 LAB — PROTIME-INR
INR: 1.04
Prothrombin Time: 13.6 seconds (ref 11.4–15.2)

## 2016-02-22 LAB — SURGICAL PCR SCREEN
MRSA, PCR: NEGATIVE
Staphylococcus aureus: NEGATIVE

## 2016-02-22 SURGERY — DEBRIDEMENT, WOUND, STERNUM
Anesthesia: General

## 2016-02-22 MED ORDER — MORPHINE SULFATE (PF) 2 MG/ML IV SOLN
1.0000 mg | INTRAVENOUS | Status: DC | PRN
  Filled 2016-02-22: qty 1

## 2016-02-22 MED ORDER — FENTANYL CITRATE (PF) 250 MCG/5ML IJ SOLN
INTRAMUSCULAR | Status: AC
  Filled 2016-02-22: qty 5

## 2016-02-22 MED ORDER — SODIUM CHLORIDE 0.9 % IR SOLN
Status: DC | PRN
  Administered 2016-02-22: 500 mL

## 2016-02-22 MED ORDER — SUCCINYLCHOLINE CHLORIDE 200 MG/10ML IV SOSY
PREFILLED_SYRINGE | INTRAVENOUS | Status: AC
  Filled 2016-02-22: qty 10

## 2016-02-22 MED ORDER — ROCURONIUM BROMIDE 10 MG/ML (PF) SYRINGE
PREFILLED_SYRINGE | INTRAVENOUS | Status: AC
  Filled 2016-02-22: qty 10

## 2016-02-22 MED ORDER — PROPOFOL 10 MG/ML IV BOLUS
INTRAVENOUS | Status: DC | PRN
  Administered 2016-02-22: 150 mg via INTRAVENOUS
  Administered 2016-02-22: 50 mg via INTRAVENOUS

## 2016-02-22 MED ORDER — PROPOFOL 10 MG/ML IV BOLUS
INTRAVENOUS | Status: AC
  Filled 2016-02-22: qty 20

## 2016-02-22 MED ORDER — MIDAZOLAM HCL 2 MG/2ML IJ SOLN
INTRAMUSCULAR | Status: DC | PRN
  Administered 2016-02-22: 2 mg via INTRAVENOUS

## 2016-02-22 MED ORDER — LIDOCAINE 2% (20 MG/ML) 5 ML SYRINGE
INTRAMUSCULAR | Status: AC
  Filled 2016-02-22: qty 5

## 2016-02-22 MED ORDER — OXYCODONE HCL 5 MG/5ML PO SOLN: 5.0000 mg | Freq: Once | ORAL | Status: DC | PRN

## 2016-02-22 MED ORDER — SUCCINYLCHOLINE CHLORIDE 200 MG/10ML IV SOSY
PREFILLED_SYRINGE | INTRAVENOUS | Status: DC | PRN
  Administered 2016-02-22: 100 mg via INTRAVENOUS

## 2016-02-22 MED ORDER — ONDANSETRON HCL 4 MG/2ML IJ SOLN
INTRAMUSCULAR | Status: DC | PRN
  Administered 2016-02-22: 4 mg via INTRAVENOUS

## 2016-02-22 MED ORDER — HYDROMORPHONE HCL 1 MG/ML IJ SOLN
0.2500 mg | INTRAMUSCULAR | Status: DC | PRN
  Administered 2016-02-22 (×4): 0.5 mg via INTRAVENOUS

## 2016-02-22 MED ORDER — LIDOCAINE 2% (20 MG/ML) 5 ML SYRINGE: INTRAMUSCULAR | Status: DC | PRN

## 2016-02-22 MED ORDER — LACTATED RINGERS IV SOLN
INTRAVENOUS | Status: DC
  Administered 2016-02-22: 11:00:00 via INTRAVENOUS

## 2016-02-22 MED ORDER — MORPHINE SULFATE (PF) 2 MG/ML IV SOLN
1.0000 mg | INTRAVENOUS | Status: DC | PRN
  Administered 2016-02-22 – 2016-02-25 (×18): 2 mg via INTRAVENOUS
  Filled 2016-02-22 (×17): qty 1

## 2016-02-22 MED ORDER — ONDANSETRON HCL 4 MG/2ML IJ SOLN
INTRAMUSCULAR | Status: AC
  Filled 2016-02-22: qty 2

## 2016-02-22 MED ORDER — ONDANSETRON HCL 4 MG/2ML IJ SOLN: 4.0000 mg | Freq: Four times a day (QID) | INTRAMUSCULAR | Status: DC | PRN

## 2016-02-22 MED ORDER — 0.9 % SODIUM CHLORIDE (POUR BTL) OPTIME
TOPICAL | Status: DC | PRN
  Administered 2016-02-22: 2000 mL

## 2016-02-22 MED ORDER — FENTANYL CITRATE (PF) 250 MCG/5ML IJ SOLN
INTRAMUSCULAR | Status: DC | PRN
  Administered 2016-02-22: 100 ug via INTRAVENOUS
  Administered 2016-02-22: 50 ug via INTRAVENOUS
  Administered 2016-02-22: 100 ug via INTRAVENOUS
  Administered 2016-02-22 (×2): 50 ug via INTRAVENOUS
  Administered 2016-02-22: 100 ug via INTRAVENOUS
  Administered 2016-02-22: 50 ug via INTRAVENOUS

## 2016-02-22 MED ORDER — MIDAZOLAM HCL 2 MG/2ML IJ SOLN
INTRAMUSCULAR | Status: AC
  Filled 2016-02-22: qty 2

## 2016-02-22 MED ORDER — VANCOMYCIN HCL 1000 MG IV SOLR
INTRAVENOUS | Status: AC
  Filled 2016-02-22: qty 1000

## 2016-02-22 MED ORDER — SODIUM CHLORIDE 0.9 % IV SOLN
INTRAVENOUS | Status: DC
  Administered 2016-02-22 – 2016-02-28 (×11): via INTRAVENOUS

## 2016-02-22 MED ORDER — LACTATED RINGERS IV SOLN
INTRAVENOUS | Status: DC | PRN
  Administered 2016-02-22: 11:00:00 via INTRAVENOUS

## 2016-02-22 MED ORDER — DEXTROSE 5 % IV SOLN
2.0000 g | INTRAVENOUS | Status: DC
  Administered 2016-02-22: 2 g via INTRAVENOUS
  Filled 2016-02-22 (×2): qty 2

## 2016-02-22 MED ORDER — OXYCODONE HCL 5 MG PO TABS: 5.0000 mg | ORAL_TABLET | Freq: Once | ORAL | Status: DC | PRN

## 2016-02-22 MED ORDER — HYDROMORPHONE HCL 2 MG/ML IJ SOLN
INTRAMUSCULAR | Status: AC
  Filled 2016-02-22: qty 1

## 2016-02-22 SURGICAL SUPPLY — 45 items
BLADE SURG 10 STRL SS (BLADE) ×2 IMPLANT
BNDG CONFORM 3 STRL LF (GAUZE/BANDAGES/DRESSINGS) ×2 IMPLANT
BNDG GAUZE ELAST 4 BULKY (GAUZE/BANDAGES/DRESSINGS) ×2 IMPLANT
CANISTER SUCTION 2500CC (MISCELLANEOUS) ×2 IMPLANT
CLIP TI MEDIUM 24 (CLIP) ×2 IMPLANT
CLIP TI WIDE RED SMALL 24 (CLIP) ×2 IMPLANT
CONT SPEC 4OZ CLIKSEAL STRL BL (MISCELLANEOUS) IMPLANT
DRAPE HALF SHEET 40X57 (DRAPES) ×2 IMPLANT
DRAPE INCISE IOBAN 66X45 STRL (DRAPES) ×2 IMPLANT
DRAPE LAPAROSCOPIC ABDOMINAL (DRAPES) ×2 IMPLANT
DRAPE SLUSH/WARMER DISC (DRAPES) IMPLANT
DRSG PAD ABDOMINAL 8X10 ST (GAUZE/BANDAGES/DRESSINGS) ×2 IMPLANT
ELECT REM PT RETURN 9FT ADLT (ELECTROSURGICAL) ×2
ELECTRODE REM PT RTRN 9FT ADLT (ELECTROSURGICAL) ×1 IMPLANT
GAUZE SPONGE 4X4 12PLY STRL (GAUZE/BANDAGES/DRESSINGS) ×2 IMPLANT
GLOVE EUDERMIC 7 POWDERFREE (GLOVE) ×4 IMPLANT
GOWN STRL REUS W/ TWL LRG LVL3 (GOWN DISPOSABLE) ×2 IMPLANT
GOWN STRL REUS W/ TWL XL LVL3 (GOWN DISPOSABLE) ×1 IMPLANT
GOWN STRL REUS W/TWL LRG LVL3 (GOWN DISPOSABLE) ×2
GOWN STRL REUS W/TWL XL LVL3 (GOWN DISPOSABLE) ×1
HANDPIECE INTERPULSE COAX TIP (DISPOSABLE)
HEMOSTAT POWDER SURGIFOAM 1G (HEMOSTASIS) IMPLANT
KIT BASIN OR (CUSTOM PROCEDURE TRAY) ×2 IMPLANT
KIT ROOM TURNOVER OR (KITS) ×2 IMPLANT
NS IRRIG 1000ML POUR BTL (IV SOLUTION) ×4 IMPLANT
PACK CHEST (CUSTOM PROCEDURE TRAY) ×2 IMPLANT
PAD ARMBOARD 7.5X6 YLW CONV (MISCELLANEOUS) ×4 IMPLANT
SET HNDPC FAN SPRY TIP SCT (DISPOSABLE) IMPLANT
SPONGE GAUZE 4X4 12PLY STER LF (GAUZE/BANDAGES/DRESSINGS) ×2 IMPLANT
SPONGE LAP 18X18 X RAY DECT (DISPOSABLE) ×2 IMPLANT
SUT STEEL 6MS V (SUTURE) IMPLANT
SUT STEEL STERNAL CCS#1 18IN (SUTURE) IMPLANT
SUT STEEL SZ 6 DBL 3X14 BALL (SUTURE) IMPLANT
SUT VIC AB 1 CTX 36 (SUTURE)
SUT VIC AB 1 CTX36XBRD ANBCTR (SUTURE) IMPLANT
SUT VIC AB 2-0 CTX 27 (SUTURE) IMPLANT
SUT VIC AB 3-0 X1 27 (SUTURE) IMPLANT
SWAB COLLECTION DEVICE MRSA (MISCELLANEOUS) IMPLANT
SYR 5ML LL (SYRINGE) IMPLANT
TOWEL OR 17X24 6PK STRL BLUE (TOWEL DISPOSABLE) ×2 IMPLANT
TOWEL OR 17X26 10 PK STRL BLUE (TOWEL DISPOSABLE) ×2 IMPLANT
TRAP SPECIMEN MUCOUS 40CC (MISCELLANEOUS) ×2 IMPLANT
TRAY FOLEY CATH 14FRSI W/METER (CATHETERS) IMPLANT
TUBE ANAEROBIC SPECIMEN COL (MISCELLANEOUS) IMPLANT
WATER STERILE IRR 1000ML POUR (IV SOLUTION) ×2 IMPLANT

## 2016-02-22 NOTE — Interval H&P Note (Signed)
History and Physical Interval Note:  02/22/2016 11:19 AM  [name]  has presented today for surgery, with the diagnosis of abscess  The various methods of treatment have been discussed with the patient and family. After consideration of risks, benefits and other options for treatment, the patient has consented to  Procedure(s): IRRIGATION AND DEBRIDEMENT MANUBRIUM ABSCESS (N/A) as a surgical intervention .  The patient's history has been reviewed, patient examined, no change in status, stable for surgery.  I have reviewed the patient's chart and labs.  Questions were answered to the patient's satisfaction.     Melrose Nakayama

## 2016-02-22 NOTE — Consult Note (Signed)
[facility]    Date of Admission:  02/21/2016           Day 2 vancomycin        Day 2 piperacillin tazobactam       Reason for Consult: Smoldering right sternoclavicular joint septic arthritis with abscess    Referring Physician: Dr. Merilynn Finland   Principal Problem:   Septic arthritis of right sternoclavicular joint [facility]) Active Problems:   Abscess of chest [facility])   Pulmonary embolism, septic ([place])   Polysubstance abuse   IV drug abuse   Hepatitis C infection   Opioid abuse   Obesity   Depression   COPD (chronic obstructive pulmonary disease) ([place])   Anxiety   Cigarette smoker   . HYDROmorphone      . piperacillin-tazobactam (ZOSYN)  IV  3.375 g Intravenous Q8H  . vancomycin  750 mg Intravenous Q8H    Recommendations: 1. Continue vancomycin 2. Change piperacillin tazobactam to ceftriaxone 3. Await results of blood and abscess cultures 4. Await results of TTE 5. Hepatitis B serology and HIV antibody   Assessment: She has septic arthritis of her right sternoclavicular joint associated with abscess. This has been smoldering for about 2 months. She has some small pulmonary nodules that may be septic pulmonary emboli. She is certainly at risk for endocarditis. Last year she was admitted with forearm abscesses that grew Eikenella and strep. I will treat her with vancomycin and ceftriaxone pending final cultures.   HPI: [name] is a 45 y.o. female with history of injecting drug use who has had right upper chest and shoulder pain for the past 2 months. She was seen in the ED on 01/01/2016 and referred to Dr. Ples Specter. He arranged for a CT scan but she never followed through with PET scan. She presented yesterday with pain and swelling over the right sternoclavicular joint and CT revealed joint destruction and abscess as well as a few small pulmonary nodules. She underwent incision and drainage today. The initial Gram stain of  operative specimens did not show any organisms.   Review of Systems: Review of Systems  Constitutional: Positive for chills and malaise/fatigue. Negative for diaphoresis, fever and weight loss.  HENT: Negative for sore throat.   Respiratory: Negative for cough, sputum production and shortness of breath.        No change in chronic, productive smoker's cough.  Cardiovascular: Positive for chest pain.  Gastrointestinal: Negative for abdominal pain, diarrhea, heartburn, nausea and vomiting.  Genitourinary: Negative for dysuria and frequency.  Musculoskeletal: Positive for joint pain. Negative for myalgias.  Skin: Negative for itching and rash.  Neurological: Negative for dizziness and headaches.  Psychiatric/Behavioral: Positive for depression and substance abuse. The patient is nervous/anxious.     Past Medical History:  Diagnosis Date  . Anxiety   . Chronic back pain   . Chronic knee pain   . COPD (chronic obstructive pulmonary disease) ([place])   . DDD (degenerative disc disease)   . Hepatitis C infection 07/13/2014  . IV drug abuse   . Nephrolithiasis   . Panic attacks   . Polysubstance abuse    cocaine, opiates, marijuana, amphetamines, "molly," crystal meth  . Pulmonary embolism [facility])     Social History  Substance Use Topics  . Smoking status: Current Every Day Smoker    Packs/day: 0.50    Years: 25.00    Types: Cigarettes  . Smokeless tobacco: Never Used  .  Alcohol use No    Family History  Problem Relation Age of Onset  . COPD Mother   . Hypertension Father   . Heart attack Father   . Diabetes Father   . Arthritis    . Lung disease    . Cancer    . Asthma     Allergies  Allergen Reactions  . Doxycycline Other (See Comments)    burns  . Mango Flavor Hives and Swelling  . Pyridium [Phenazopyridine Hcl] Other (See Comments)    Irritation to skin  . Tramadol Nausea And Vomiting    OBJECTIVE: Blood pressure 102/77, pulse 78, temperature 98.8 F (37.1 C),  temperature source Oral, resp. rate 18, height 5\' 1"  (1.549 m), weight 167 lb 12.3 oz (76.1 kg), last menstrual period 01/21/2016, SpO2 94 %.  Physical Exam  Constitutional: She is oriented to person, place, and time.  She is groggy sitting up in bed.  HENT:  Mouth/Throat: No oropharyngeal exudate.  Eyes: Conjunctivae are normal.  Cardiovascular: Normal rate and regular rhythm.   No murmur heard. Pulmonary/Chest: Effort normal and breath sounds normal. She has no wheezes. She has no rales.  Abdominal: Soft. She exhibits no mass. There is no tenderness.  Musculoskeletal:  She has a bulky dressing over her right clavicle.  Neurological: She is alert and oriented to person, place, and time.  Skin: No rash noted.  She has a few tattoos. She has some scars and track marks on her arms.  Psychiatric: Mood and affect normal.    Lab Results Lab Results  Component Value Date   WBC 7.8 02/22/2016   HGB 9.0 (L) 02/22/2016   HCT 28.1 (L) 02/22/2016   MCV 78.7 02/22/2016   PLT 363 02/22/2016    Lab Results  Component Value Date   CREATININE 0.80 02/22/2016   BUN 7 02/22/2016   NA 138 02/22/2016   K 3.8 02/22/2016   CL 106 02/22/2016   CO2 26 02/22/2016    Lab Results  Component Value Date   ALT 12 (L) 02/21/2016   AST 12 (L) 02/21/2016   ALKPHOS 73 02/21/2016   BILITOT 0.7 02/21/2016     Microbiology: Recent Results (from the past 240 hour(s))  Blood culture (routine x 2)     Status: None (Preliminary result)   Collection Time: 02/21/16  2:16 PM  Result Value Ref Range Status   Specimen Description BLOOD  Final   Special Requests NONE  Final   Culture NO GROWTH < 24 HOURS  Final   Report Status PENDING  Incomplete  Blood culture (routine x 2)     Status: None (Preliminary result)   Collection Time: 02/21/16  2:34 PM  Result Value Ref Range Status   Specimen Description BLOOD  Final   Special Requests NONE  Final   Culture NO GROWTH < 24 HOURS  Final   Report Status  PENDING  Incomplete  Surgical pcr screen     Status: None   Collection Time: 02/22/16 10:34 AM  Result Value Ref Range Status   MRSA, PCR NEGATIVE NEGATIVE Final   Staphylococcus aureus NEGATIVE NEGATIVE Final    Comment:        The Xpert SA Assay (FDA approved for NASAL specimens in patients over 15 years of age), is one component of a comprehensive surveillance program.  Test performance has been validated by [facility] for patients greater than or equal to 66 year old. It is not intended to diagnose infection nor to  guide or monitor treatment.   Aerobic Culture (superficial specimen)     Status: None (Preliminary result)   Collection Time: 02/22/16 11:57 AM  Result Value Ref Range Status   Specimen Description ABSCESS  Final   Special Requests   Final    STERNOCLAVICULAR SPECIMEN A PATIENT ON FOLLOWING  VANCOMYCIN   Gram Stain PENDING  Incomplete   Culture PENDING  Incomplete   Report Status PENDING  Incomplete  Aerobic/Anaerobic Culture (surgical/deep wound)     Status: None (Preliminary result)   Collection Time: 02/22/16 12:00 PM  Result Value Ref Range Status   Specimen Description ABSCESS  Final   Special Requests   Final    STERNOCLAVICULAR SPECIMEN B PATIENT ON FOLLOWING  VANCOMYCIN   Gram Stain   Final    ABUNDANT WBC PRESENT,BOTH PMN AND MONONUCLEAR NO ORGANISMS SEEN Gram Stain Report Called to,Read Back By and Verified With: B GORDON,RN AT [id number] 02/22/16 BY L BENFIELD    Culture PENDING  Incomplete   Report Status PENDING  Incomplete    Michel Bickers, MD [facility] [phone] pager   [phone] cell 02/22/2016, 3:18 PM

## 2016-02-22 NOTE — H&P (View-Only) (Signed)
Reason for Consult:Sternoclavicular abscess Referring Physician: [facility]  [name] is an 45 y.o. female.  HPI: 45 yo woman with history of polysubstance abuse, most recently regular IV heroin. She presented to the [facility] Ed yesterday with a c/o of pain and swelling in the upper sternal area radiating into right shoulder. This has been present for about a month and she had been evaluated by an orthopedist but had not followed up. She told the hospitalist yesterday that she came in because it had become more red and tender over the previous 24 hours, but tells me she is not sure why she came in. A CT showed septic arthritis in the right [place] joint/ manubrium with bony destruction. There were also probable septic emboli in the lungs.   She was transferred to [facility] and started on vanco and Zosyn. She says the pain is about the same.  Last used heroin yesterday morning. C/o feeling like she is starting to withdraw.  Past Medical History:  Diagnosis Date  . Anxiety   . Chronic back pain   . Chronic knee pain   . COPD (chronic obstructive pulmonary disease) ([place])   . DDD (degenerative disc disease)   . Hepatitis C infection 07/13/2014  . IV drug abuse   . Nephrolithiasis   . Panic attacks   . Polysubstance abuse    cocaine, opiates, marijuana, amphetamines, "molly," crystal meth  . Pulmonary embolism [facility])     Past Surgical History:  Procedure Laterality Date  . CESAREAN SECTION    . NOSE SURGERY    . tubes tied      Family History  Problem Relation Age of Onset  . COPD Mother   . Hypertension Father   . Heart attack Father   . Diabetes Father   . Arthritis    . Lung disease    . Cancer    . Asthma      Social History:  reports that she has been smoking Cigarettes.  She has a 12.50 pack-year smoking history. She has never used smokeless tobacco. She reports that she uses drugs, including IV, Cocaine, Marijuana, and Methamphetamines. She reports that she does not  drink alcohol.  Allergies:  Allergies  Allergen Reactions  . Doxycycline Other (See Comments)    burns  . Mango Flavor Hives and Swelling  . Pyridium [Phenazopyridine Hcl] Other (See Comments)    Irritation to skin  . Tramadol Nausea And Vomiting    Medications:  Scheduled: . piperacillin-tazobactam (ZOSYN)  IV  3.375 g Intravenous Q8H  . vancomycin  750 mg Intravenous Q8H    Results for orders placed or performed during the hospital encounter of 02/21/16 (from the past 48 hour(s))  CBC with Differential/Platelet     Status: Abnormal   Collection Time: 02/21/16  2:15 PM  Result Value Ref Range   WBC 10.9 (H) 4.0 - 10.5 K/uL   RBC 3.89 3.87 - 5.11 MIL/uL   Hemoglobin 10.1 (L) 12.0 - 15.0 g/dL   HCT 31.0 (L) 36.0 - 46.0 %   MCV 79.7 78.0 - 100.0 fL   MCH 26.0 26.0 - 34.0 pg   MCHC 32.6 30.0 - 36.0 g/dL   RDW 16.0 (H) 11.5 - 15.5 %   Platelets 433 (H) 150 - 400 K/uL   Neutrophils Relative % 86 %   Neutro Abs 9.4 (H) 1.7 - 7.7 K/uL   Lymphocytes Relative 11 %   Lymphs Abs 1.2 0.7 - 4.0 K/uL   Monocytes  Relative 2 %   Monocytes Absolute 0.2 0.1 - 1.0 K/uL   Eosinophils Relative 1 %   Eosinophils Absolute 0.1 0.0 - 0.7 K/uL   Basophils Relative 0 %   Basophils Absolute 0.0 0.0 - 0.1 K/uL  Comprehensive metabolic panel     Status: Abnormal   Collection Time: 02/21/16  2:15 PM  Result Value Ref Range   Sodium 136 135 - 145 mmol/L   Potassium 3.8 3.5 - 5.1 mmol/L   Chloride 103 101 - 111 mmol/L   CO2 26 22 - 32 mmol/L   Glucose, Bld 88 65 - 99 mg/dL   BUN 7 6 - 20 mg/dL   Creatinine, Ser 0.59 0.44 - 1.00 mg/dL   Calcium 8.7 (L) 8.9 - 10.3 mg/dL   Total Protein 7.5 6.5 - 8.1 g/dL   Albumin 2.7 (L) 3.5 - 5.0 g/dL   AST 12 (L) 15 - 41 U/L   ALT 12 (L) 14 - 54 U/L   Alkaline Phosphatase 73 38 - 126 U/L   Total Bilirubin 0.7 0.3 - 1.2 mg/dL   GFR calc non Af Amer >60 >60 mL/min   GFR calc Af Amer >60 >60 mL/min    Comment: (NOTE) The eGFR has been calculated using the  CKD EPI equation. This calculation has not been validated in all clinical situations. eGFR's persistently <60 mL/min signify possible Chronic Kidney Disease.    Anion gap 7 5 - 15  Blood culture (routine x 2)     Status: None (Preliminary result)   Collection Time: 02/21/16  2:16 PM  Result Value Ref Range   Specimen Description BLOOD    Special Requests NONE    Culture NO GROWTH < 24 HOURS    Report Status PENDING   Lactic acid, plasma     Status: None   Collection Time: 02/21/16  2:17 PM  Result Value Ref Range   Lactic Acid, Venous 0.7 0.5 - 1.9 mmol/L  Blood culture (routine x 2)     Status: None (Preliminary result)   Collection Time: 02/21/16  2:34 PM  Result Value Ref Range   Specimen Description BLOOD    Special Requests NONE    Culture NO GROWTH < 24 HOURS    Report Status PENDING   Urinalysis, Routine w reflex microscopic (not at [facility])     Status: Abnormal   Collection Time: 02/21/16  4:00 PM  Result Value Ref Range   Color, Urine YELLOW YELLOW   APPearance CLEAR CLEAR   Specific Gravity, Urine <1.005 (L) 1.005 - 1.030   pH 7.5 5.0 - 8.0   Glucose, UA NEGATIVE NEGATIVE mg/dL   Hgb urine dipstick NEGATIVE NEGATIVE   Bilirubin Urine NEGATIVE NEGATIVE   Ketones, ur NEGATIVE NEGATIVE mg/dL   Protein, ur NEGATIVE NEGATIVE mg/dL   Nitrite NEGATIVE NEGATIVE   Leukocytes, UA NEGATIVE NEGATIVE    Comment: MICROSCOPIC NOT DONE ON URINES WITH NEGATIVE PROTEIN, BLOOD, LEUKOCYTES, NITRITE, OR GLUCOSE <1000 mg/dL.  Basic metabolic panel     Status: Abnormal   Collection Time: 02/22/16  6:10 AM  Result Value Ref Range   Sodium 138 135 - 145 mmol/L   Potassium 3.8 3.5 - 5.1 mmol/L   Chloride 106 101 - 111 mmol/L   CO2 26 22 - 32 mmol/L   Glucose, Bld 115 (H) 65 - 99 mg/dL   BUN 7 6 - 20 mg/dL   Creatinine, Ser 0.80 0.44 - 1.00 mg/dL   Calcium 8.6 (L) 8.9 - 10.3  mg/dL   GFR calc non Af Amer >60 >60 mL/min   GFR calc Af Amer >60 >60 mL/min    Comment: (NOTE) The eGFR has  been calculated using the CKD EPI equation. This calculation has not been validated in all clinical situations. eGFR's persistently <60 mL/min signify possible Chronic Kidney Disease.    Anion gap 6 5 - 15  CBC     Status: Abnormal   Collection Time: 02/22/16  6:10 AM  Result Value Ref Range   WBC 7.8 4.0 - 10.5 K/uL   RBC 3.57 (L) 3.87 - 5.11 MIL/uL   Hemoglobin 9.0 (L) 12.0 - 15.0 g/dL   HCT 28.1 (L) 36.0 - 46.0 %   MCV 78.7 78.0 - 100.0 fL   MCH 25.2 (L) 26.0 - 34.0 pg   MCHC 32.0 30.0 - 36.0 g/dL   RDW 16.4 (H) 11.5 - 15.5 %   Platelets 363 150 - 400 K/uL    Ct Chest Wo Contrast  Result Date: 02/21/2016 CLINICAL DATA:  45 year old female with chest growth and pain. IV drug abuse. EXAM: CT CHEST WITHOUT CONTRAST TECHNIQUE: Multidetector CT imaging of the chest was performed following the standard protocol without IV contrast. COMPARISON:  10/08/2005 chest CT.  05/03/2015 chest radiograph. FINDINGS: Cardiovascular: Normal heart size. No significant pericardial fluid/thickening. Great vessels are normal in course and caliber. Mediastinum/Nodes: No discrete thyroid nodules. Unremarkable esophagus. Mild right axillary adenopathy measuring up to 1.1 cm (series 3/image 60), new since 10/08/2005. No left axillary adenopathy. Right supraclavicular adenopathy measuring up to 1.1 cm (series 3/image 28), new. Anterior right mediastinal prevascular adenopathy measuring up to 1.0 cm (series 3/ image 52), new. No additional pathologically enlarged mediastinal or gross hilar nodes on this noncontrast study. Lungs/Pleura: No pneumothorax. No pleural effusion. Poorly marginated 1.7 x 1.0 cm peripheral basilar right upper lobe pulmonary nodule (series 4/ image 77) with ground-glass halo, new since 2007. Poorly marginated 6 mm solid right lower lobe pulmonary nodule (series 4/ image 118), new. Apical left upper lobe 2 mm solid pulmonary nodule (series 4/ image 37) is stable since 2007 and considered benign.  No additional significant pulmonary nodules. Upper abdomen: Unremarkable. Musculoskeletal: There is extensive erosive change involving the right sternoclavicular and anterior right first costo manubrial joints with associated large joint effusion causing local mass effect in the right anterior upper mediastinum. The there is an overlying 3.4 x 2.3 x 3.1 cm abscess in the medial ventral right upper chest wall involving the deep subcutaneous soft tissues and medial right pectoral muscle (series 3/ image 37). IMPRESSION: 1. Severe septic arthritis of the right sternoclavicular and right first costomanubrial joints with associated large joint effusion. Orthopedic surgery consultation advised. 2. Overlying 3.4 x 2.3 x 3.1 cm abscess in the medial ventral upper right chest wall involving the deep subcutaneous soft tissues and medial right pectoral muscle. 3. Poorly marginated 1.7 cm basilar right upper lobe and 0.6 cm right lower lobe pulmonary nodules, new since 2007, suspicious for septic emboli given the history of IV drug abuse. Follow-up post treatment chest CT is recommended in 3 months. 4. Mild right axillary, right supraclavicular and anterior right upper mediastinal lymphadenopathy, nonspecific, probably reactive. These nodes can also be reassessed on follow-up chest CT in 3 months. Electronically Signed   By: Ilona Sorrel M.D.   On: 02/21/2016 13:41    Review of Systems  Constitutional: Positive for fever ("sometimes") and malaise/fatigue. Negative for chills.  Respiratory: Negative for cough and shortness of breath.  Cardiovascular: Positive for chest pain (chest wall pain upper sternum). Negative for leg swelling.  Gastrointestinal: Negative for nausea and vomiting.  Genitourinary: Negative for dysuria and frequency.  Musculoskeletal: Positive for back pain and joint pain.  Psychiatric/Behavioral: Positive for depression. The patient is nervous/anxious.   All other systems reviewed and are  negative.  Blood pressure (!) 176/71, pulse (!) 54, temperature 98 F (36.7 C), temperature source Oral, resp. rate 18, height _0  (1.549 m), weight 167 lb 12.3 oz (76.1 kg), last menstrual period 01/21/2016, SpO2 98 %. Physical Exam  Vitals reviewed. Constitutional: She is oriented to person, place, and time. She appears distressed (mildly).  Ill- appearing  HENT:  Head: Normocephalic and atraumatic.  Mouth/Throat: No oropharyngeal exudate.  Eyes: Conjunctivae and EOM are normal. No scleral icterus.  Neck: Neck supple. No thyromegaly present.  Cardiovascular: Normal rate and regular rhythm.   No murmur heard. Respiratory: Effort normal and breath sounds normal. No respiratory distress. She has no wheezes.  5 cm warm, erythematous, hard "mass" manubrium  GI: Soft. She exhibits no distension. There is no tenderness.  Musculoskeletal: She exhibits no edema.  Neurological: She is alert and oriented to person, place, and time. No cranial nerve deficit.  Skin: Skin is warm and dry. There is erythema (see chest wall).    Assessment/Plan: 45 yo woman with history of IVDA who presents with a right sternoclavicular/ manubrial abscess. This needs to be drained in the OR under [place].   She will need ID consult  Probably should have a TEE as well to rule endocarditis. She does not have a prominent murmur on exam and has no signs of heart failure, so this is not urgent.  I advised her to undergo I and D of manubrial abscess. I informed her of the general nature of the procedure, the need for general anesthesia, and the need for ongoing wound care. I informed her of the indications, risks, benefits and alternatives. She understands the risks include but are not limited to death, MI, DVT, PE, bleeding, infection, pain, as well as the possibility of other unforeseeable complications. She was informed she may have ongoing pain and functional issues related to the destruction of bone and ligaments. She  accepts the risks and agrees to proceed.  OR notified    Melrose Nakayama 02/22/2016, 9:56 AM

## 2016-02-22 NOTE — Consult Note (Signed)
Reason for Consult:Sternoclavicular abscess Referring Physician: [facility]  [name] is an 45 y.o. female.  HPI: 45 yo woman with history of polysubstance abuse, most recently regular IV heroin. She presented to the [facility] yesterday with a c/o of pain and swelling in the upper sternal area radiating into right shoulder. This has been present for about a month and she had been evaluated by an orthopedist but had not followed up. She told the hospitalist yesterday that she came in because it had become more red and tender over the previous 24 hours, but tells me she is not sure why she came in. A CT showed septic arthritis in the right [place] joint/ manubrium with bony destruction. There were also probable septic emboli in the lungs.   She was transferred to [facility] and started on vanco and Zosyn. She says the pain is about the same.  Last used heroin yesterday morning. C/o feeling like she is starting to withdraw.  Past Medical History:  Diagnosis Date  . Anxiety   . Chronic back pain   . Chronic knee pain   . COPD (chronic obstructive pulmonary disease) ([place])   . DDD (degenerative disc disease)   . Hepatitis C infection 07/13/2014  . IV drug abuse   . Nephrolithiasis   . Panic attacks   . Polysubstance abuse    cocaine, opiates, marijuana, amphetamines, "molly," crystal meth  . Pulmonary embolism [facility])     Past Surgical History:  Procedure Laterality Date  . CESAREAN SECTION    . NOSE SURGERY    . tubes tied      Family History  Problem Relation Age of Onset  . COPD Mother   . Hypertension Father   . Heart attack Father   . Diabetes Father   . Arthritis    . Lung disease    . Cancer    . Asthma      Social History:  reports that she has been smoking Cigarettes.  She has a 12.50 pack-year smoking history. She has never used smokeless tobacco. She reports that she uses drugs, including IV, Cocaine, Marijuana, and Methamphetamines. She reports that she does not  drink alcohol.  Allergies:  Allergies  Allergen Reactions  . Doxycycline Other (See Comments)    burns  . Mango Flavor Hives and Swelling  . Pyridium [Phenazopyridine Hcl] Other (See Comments)    Irritation to skin  . Tramadol Nausea And Vomiting    Medications:  Scheduled: . piperacillin-tazobactam (ZOSYN)  IV  3.375 g Intravenous Q8H  . vancomycin  750 mg Intravenous Q8H    Results for orders placed or performed during the hospital encounter of 02/21/16 (from the past 48 hour(s))  CBC with Differential/Platelet     Status: Abnormal   Collection Time: 02/21/16  2:15 PM  Result Value Ref Range   WBC 10.9 (H) 4.0 - 10.5 K/uL   RBC 3.89 3.87 - 5.11 MIL/uL   Hemoglobin 10.1 (L) 12.0 - 15.0 g/dL   HCT 31.0 (L) 36.0 - 46.0 %   MCV 79.7 78.0 - 100.0 fL   MCH 26.0 26.0 - 34.0 pg   MCHC 32.6 30.0 - 36.0 g/dL   RDW 16.0 (H) 11.5 - 15.5 %   Platelets 433 (H) 150 - 400 K/uL   Neutrophils Relative % 86 %   Neutro Abs 9.4 (H) 1.7 - 7.7 K/uL   Lymphocytes Relative 11 %   Lymphs Abs 1.2 0.7 - 4.0 K/uL   Monocytes  Relative 2 %   Monocytes Absolute 0.2 0.1 - 1.0 K/uL   Eosinophils Relative 1 %   Eosinophils Absolute 0.1 0.0 - 0.7 K/uL   Basophils Relative 0 %   Basophils Absolute 0.0 0.0 - 0.1 K/uL  Comprehensive metabolic panel     Status: Abnormal   Collection Time: 02/21/16  2:15 PM  Result Value Ref Range   Sodium 136 135 - 145 mmol/L   Potassium 3.8 3.5 - 5.1 mmol/L   Chloride 103 101 - 111 mmol/L   CO2 26 22 - 32 mmol/L   Glucose, Bld 88 65 - 99 mg/dL   BUN 7 6 - 20 mg/dL   Creatinine, Ser 0.59 0.44 - 1.00 mg/dL   Calcium 8.7 (L) 8.9 - 10.3 mg/dL   Total Protein 7.5 6.5 - 8.1 g/dL   Albumin 2.7 (L) 3.5 - 5.0 g/dL   AST 12 (L) 15 - 41 U/L   ALT 12 (L) 14 - 54 U/L   Alkaline Phosphatase 73 38 - 126 U/L   Total Bilirubin 0.7 0.3 - 1.2 mg/dL   GFR calc non Af Amer >60 >60 mL/min   GFR calc Af Amer >60 >60 mL/min    Comment: (NOTE) The eGFR has been calculated using the  CKD EPI equation. This calculation has not been validated in all clinical situations. eGFR's persistently <60 mL/min signify possible Chronic Kidney Disease.    Anion gap 7 5 - 15  Blood culture (routine x 2)     Status: None (Preliminary result)   Collection Time: 02/21/16  2:16 PM  Result Value Ref Range   Specimen Description BLOOD    Special Requests NONE    Culture NO GROWTH < 24 HOURS    Report Status PENDING   Lactic acid, plasma     Status: None   Collection Time: 02/21/16  2:17 PM  Result Value Ref Range   Lactic Acid, Venous 0.7 0.5 - 1.9 mmol/L  Blood culture (routine x 2)     Status: None (Preliminary result)   Collection Time: 02/21/16  2:34 PM  Result Value Ref Range   Specimen Description BLOOD    Special Requests NONE    Culture NO GROWTH < 24 HOURS    Report Status PENDING   Urinalysis, Routine w reflex microscopic (not at [facility])     Status: Abnormal   Collection Time: 02/21/16  4:00 PM  Result Value Ref Range   Color, Urine YELLOW YELLOW   APPearance CLEAR CLEAR   Specific Gravity, Urine <1.005 (L) 1.005 - 1.030   pH 7.5 5.0 - 8.0   Glucose, UA NEGATIVE NEGATIVE mg/dL   Hgb urine dipstick NEGATIVE NEGATIVE   Bilirubin Urine NEGATIVE NEGATIVE   Ketones, ur NEGATIVE NEGATIVE mg/dL   Protein, ur NEGATIVE NEGATIVE mg/dL   Nitrite NEGATIVE NEGATIVE   Leukocytes, UA NEGATIVE NEGATIVE    Comment: MICROSCOPIC NOT DONE ON URINES WITH NEGATIVE PROTEIN, BLOOD, LEUKOCYTES, NITRITE, OR GLUCOSE <1000 mg/dL.  Basic metabolic panel     Status: Abnormal   Collection Time: 02/22/16  6:10 AM  Result Value Ref Range   Sodium 138 135 - 145 mmol/L   Potassium 3.8 3.5 - 5.1 mmol/L   Chloride 106 101 - 111 mmol/L   CO2 26 22 - 32 mmol/L   Glucose, Bld 115 (H) 65 - 99 mg/dL   BUN 7 6 - 20 mg/dL   Creatinine, Ser 0.80 0.44 - 1.00 mg/dL   Calcium 8.6 (L) 8.9 - 10.3  mg/dL   GFR calc non Af Amer >60 >60 mL/min   GFR calc Af Amer >60 >60 mL/min    Comment: (NOTE) The eGFR has  been calculated using the CKD EPI equation. This calculation has not been validated in all clinical situations. eGFR's persistently <60 mL/min signify possible Chronic Kidney Disease.    Anion gap 6 5 - 15  CBC     Status: Abnormal   Collection Time: 02/22/16  6:10 AM  Result Value Ref Range   WBC 7.8 4.0 - 10.5 K/uL   RBC 3.57 (L) 3.87 - 5.11 MIL/uL   Hemoglobin 9.0 (L) 12.0 - 15.0 g/dL   HCT 28.1 (L) 36.0 - 46.0 %   MCV 78.7 78.0 - 100.0 fL   MCH 25.2 (L) 26.0 - 34.0 pg   MCHC 32.0 30.0 - 36.0 g/dL   RDW 16.4 (H) 11.5 - 15.5 %   Platelets 363 150 - 400 K/uL    Ct Chest Wo Contrast  Result Date: 02/21/2016 CLINICAL DATA:  45 year old female with chest growth and pain. IV drug abuse. EXAM: CT CHEST WITHOUT CONTRAST TECHNIQUE: Multidetector CT imaging of the chest was performed following the standard protocol without IV contrast. COMPARISON:  10/08/2005 chest CT.  05/03/2015 chest radiograph. FINDINGS: Cardiovascular: Normal heart size. No significant pericardial fluid/thickening. Great vessels are normal in course and caliber. Mediastinum/Nodes: No discrete thyroid nodules. Unremarkable esophagus. Mild right axillary adenopathy measuring up to 1.1 cm (series 3/image 60), new since 10/08/2005. No left axillary adenopathy. Right supraclavicular adenopathy measuring up to 1.1 cm (series 3/image 28), new. Anterior right mediastinal prevascular adenopathy measuring up to 1.0 cm (series 3/ image 52), new. No additional pathologically enlarged mediastinal or gross hilar nodes on this noncontrast study. Lungs/Pleura: No pneumothorax. No pleural effusion. Poorly marginated 1.7 x 1.0 cm peripheral basilar right upper lobe pulmonary nodule (series 4/ image 77) with ground-glass halo, new since 2007. Poorly marginated 6 mm solid right lower lobe pulmonary nodule (series 4/ image 118), new. Apical left upper lobe 2 mm solid pulmonary nodule (series 4/ image 37) is stable since 2007 and considered benign.  No additional significant pulmonary nodules. Upper abdomen: Unremarkable. Musculoskeletal: There is extensive erosive change involving the right sternoclavicular and anterior right first costo manubrial joints with associated large joint effusion causing local mass effect in the right anterior upper mediastinum. The there is an overlying 3.4 x 2.3 x 3.1 cm abscess in the medial ventral right upper chest wall involving the deep subcutaneous soft tissues and medial right pectoral muscle (series 3/ image 37). IMPRESSION: 1. Severe septic arthritis of the right sternoclavicular and right first costomanubrial joints with associated large joint effusion. Orthopedic surgery consultation advised. 2. Overlying 3.4 x 2.3 x 3.1 cm abscess in the medial ventral upper right chest wall involving the deep subcutaneous soft tissues and medial right pectoral muscle. 3. Poorly marginated 1.7 cm basilar right upper lobe and 0.6 cm right lower lobe pulmonary nodules, new since 2007, suspicious for septic emboli given the history of IV drug abuse. Follow-up post treatment chest CT is recommended in 3 months. 4. Mild right axillary, right supraclavicular and anterior right upper mediastinal lymphadenopathy, nonspecific, probably reactive. These nodes can also be reassessed on follow-up chest CT in 3 months. Electronically Signed   By: Ilona Sorrel M.D.   On: 02/21/2016 13:41    Review of Systems  Constitutional: Positive for fever ("sometimes") and malaise/fatigue. Negative for chills.  Respiratory: Negative for cough and shortness of breath.  Cardiovascular: Positive for chest pain (chest wall pain upper sternum). Negative for leg swelling.  Gastrointestinal: Negative for nausea and vomiting.  Genitourinary: Negative for dysuria and frequency.  Musculoskeletal: Positive for back pain and joint pain.  Psychiatric/Behavioral: Positive for depression. The patient is nervous/anxious.   All other systems reviewed and are  negative.  Blood pressure (!) 176/71, pulse (!) 54, temperature 98 F (36.7 C), temperature source Oral, resp. rate 18, height _0  (1.549 m), weight 167 lb 12.3 oz (76.1 kg), last menstrual period 01/21/2016, SpO2 98 %. Physical Exam  Vitals reviewed. Constitutional: She is oriented to person, place, and time. She appears distressed (mildly).  Ill- appearing  HENT:  Head: Normocephalic and atraumatic.  Mouth/Throat: No oropharyngeal exudate.  Eyes: Conjunctivae and EOM are normal. No scleral icterus.  Neck: Neck supple. No thyromegaly present.  Cardiovascular: Normal rate and regular rhythm.   No murmur heard. Respiratory: Effort normal and breath sounds normal. No respiratory distress. She has no wheezes.  5 cm warm, erythematous, hard "mass" manubrium  GI: Soft. She exhibits no distension. There is no tenderness.  Musculoskeletal: She exhibits no edema.  Neurological: She is alert and oriented to person, place, and time. No cranial nerve deficit.  Skin: Skin is warm and dry. There is erythema (see chest wall).    Assessment/Plan: 45 yo woman with history of IVDA who presents with a right sternoclavicular/ manubrial abscess. This needs to be drained in the OR under [place].   She will need ID consult  Probably should have a TEE as well to rule endocarditis. She does not have a prominent murmur on exam and has no signs of heart failure, so this is not urgent.  I advised her to undergo I and D of manubrial abscess. I informed her of the general nature of the procedure, the need for general anesthesia, and the need for ongoing wound care. I informed her of the indications, risks, benefits and alternatives. She understands the risks include but are not limited to death, MI, DVT, PE, bleeding, infection, pain, as well as the possibility of other unforeseeable complications. She was informed she may have ongoing pain and functional issues related to the destruction of bone and ligaments. She  accepts the risks and agrees to proceed.  OR notified    Melrose Nakayama 02/22/2016, 9:56 AM

## 2016-02-22 NOTE — Brief Op Note (Addendum)
02/21/2016 - 02/22/2016  12:25 PM  PATIENT:  [name]  45 y.o. female  PRE-OPERATIVE DIAGNOSIS:  Right sternoclavicular joint abscess  POST-OPERATIVE DIAGNOSIS:  Right sternoclavicular joint abscess  PROCEDURE:  Incision and drainage of Right sternoclavicular joint/ manubrial abscess  SURGEON:  Surgeon(s) and Role:    * Melrose Nakayama, MD - Primary  PHYSICIAN ASSISTANT: WAYNE GOLD PA-C  ANESTHESIA:   general  EBL:  No intake/output data recorded.  BLOOD ADMINISTERED:none  DRAINS: none   LOCAL MEDICATIONS USED:  NONE  SPECIMEN:  Source of Specimen:  ABSCESS CULTURES  DISPOSITION OF SPECIMEN:  MICRO  COUNTS:  YES  PLAN OF CARE: Admit to inpatient   PATIENT DISPOSITION:  PACU - hemodynamically stable.   Delay start of Pharmacological VTE agent (>24hrs) due to surgical blood loss or risk of bleeding: yes  [id number]

## 2016-02-22 NOTE — Anesthesia Procedure Notes (Signed)
Procedure Name: Intubation Date/Time: 02/22/2016 11:36 AM Performed by: Marinda Elk A Pre-anesthesia Checklist: Patient identified, Emergency Drugs available, Suction available, Patient being monitored and Timeout performed Patient Re-evaluated:Patient Re-evaluated prior to inductionOxygen Delivery Method: Circle System Utilized and Circle system utilized Preoxygenation: Pre-oxygenation with 100% oxygen Intubation Type: IV induction Ventilation: Mask ventilation without difficulty Laryngoscope Size: Mac and 3 Grade View: Grade II Tube type: Subglottic suction tube Tube size: 7.0 mm Number of attempts: 1 Airway Equipment and Method: Stylet Placement Confirmation: ETT inserted through vocal cords under direct vision,  positive ETCO2 and breath sounds checked- equal and bilateral Secured at: 21 cm Tube secured with: Tape Dental Injury: Teeth and Oropharynx as per pre-operative assessment

## 2016-02-22 NOTE — Progress Notes (Signed)
PROGRESS NOTE  [name]  [id number] DOB: [birth year] DOA: 02/21/2016 PCP: [name] Patient Outpatient Specialists:  Subjective: Denied fever or chills, had severe pain in the right vertebral clavicular joint. Large likely 44 cm red and tender mass on the right sternoclavicular joint.  Brief Narrative:  [name] is Mueller 45 y.o. female with Mueller history of IV drug use (primarily heroin), hepatitis C. Patient has swollen area above her sternoclavicular joint with pain that radiates into her shoulder. Patient had been seen by Dr. Aline Brochure for complaints of some swelling and prominence over the right sternoclavicular joint on 01/26/16. Dr. Aline Brochure ordered Mueller CT, however the patient never followed up for that. She continues to have pain over the past several weeks, but suddenly had redness and extreme swelling over the past 24 hours. Ibuprofen helps mildly with pain. Patient has been using heroin IV - does about 30 g Mueller day. Last drug use was 10 AM. Denies fevers or chills.  Assessment & Plan:   Principal Problem:   Abscess of chest ([place]) Active Problems:   IV drug abuse   Opioid abuse   Pulmonary embolism, septic ([facility])   Septic arthritis of right sternoclavicular joint ([facility])   Septic arthritis of the right sternoclavicular joint -History of IVDU, presents with right sternoclavicular joint swelling, pain and low-grade fever past several weeks. -CT scan showed septic arthritis, seen by CTS, Dr. Roxan Hockey. -Taken to the OR on 02/22/2016, and fortunately seems Zosyn and vancomycin on 11/11 prior to obtaining cultures. -Has left upper extremity PICC line placed on 02/21/2016 at [place] we will involve ID in her care.  Septic pulmonary embolism -2 nodules one measures 1.7 cm and the other one 0.6 cm suspicious for septic emboli in the RML. -2-D echo pending, likely will need TEE as well. -Blood cultures obtained, on vancomycin and Zosyn  IV drug use -Drug of choice is  heroin, this is probably Mueller golden opportunity for detoxification. -Started on Bentyl, Imodium and clonidine as needed. -IV morphine as needed to control withdrawal symptoms and for postoperative pain. -The goal overall is to taper off the opioids to be discontinued prior to discharge.  Hepatitis C -Appears to be has chronic hepatitis, no transaminitis or coagulopathy -Recheck HIV (last test was in April 2016)  DVT prophylaxis: SCDs will start heparin postoperatively Code Status: Full Code Family Communication:  Disposition Plan:  Diet: Diet NPO time specified  Consultants:   None  Procedures:   None  Antimicrobials:   None  Objective: Vitals:   02/21/16 1835 02/21/16 1949 02/22/16 0616 02/22/16 0900  BP: 126/71 114/68 (!) 176/71 130/72  Pulse: 73 78 (!) 54 65  Resp: 18 19 18 18   Temp:  98.4 F (36.9 C) 98 F (36.7 C) 98.7 F (37.1 C)  TempSrc:  Oral Oral Oral  SpO2: 96% 99% 98% 99%  Weight:  76.1 kg (167 lb 12.3 oz)    Height:  5\' 1"  (1.549 m)      Intake/Output Summary (Last 24 hours) at 02/22/16 1111 Last data filed at 02/22/16 1000  Gross per 24 hour  Intake          2468.75 ml  Output                0 ml  Net          2468.75 ml   Filed Weights   02/21/16 1125 02/21/16 1949  Weight: 63.5 kg (140 lb) 76.1 kg (167 lb  12.3 oz)    Examination: General exam: Appears calm and comfortable  Respiratory system: Clear to auscultation. Respiratory effort normal. Cardiovascular system: S1 & S2 heard, RRR. No JVD, murmurs, rubs, gallops or clicks. No pedal edema. Gastrointestinal system: Abdomen is nondistended, soft and nontender. No organomegaly or masses felt. Normal bowel sounds heard. Central nervous system: Alert and oriented. No focal neurological deficits. Extremities: Symmetric 5 x 5 power. Skin: No rashes, lesions or ulcers Psychiatry: Judgement and insight appear normal. Mood & affect appropriate.   Data Reviewed: I have personally reviewed following  labs and imaging studies  CBC:  Recent Labs Lab 02/21/16 1415 02/22/16 0610  WBC 10.9* 7.8  NEUTROABS 9.4*  --   HGB 10.1* 9.0*  HCT 31.0* 28.1*  MCV 79.7 78.7  PLT 433* [id number]   Basic Metabolic Panel:  Recent Labs Lab 02/21/16 1415 02/22/16 0610  NA 136 138  K 3.8 3.8  CL 103 106  CO2 26 26  GLUCOSE 88 115*  BUN 7 7  CREATININE 0.59 0.80  CALCIUM 8.7* 8.6*   GFR: Estimated Creatinine Clearance: 83.7 mL/min (by C-G formula based on SCr of 0.8 mg/dL). Liver Function Tests:  Recent Labs Lab 02/21/16 1415  AST 12*  ALT 12*  ALKPHOS 73  BILITOT 0.7  PROT 7.5  ALBUMIN 2.7*   No results for input(s): LIPASE, AMYLASE in the last 168 hours. No results for input(s): AMMONIA in the last 168 hours. Coagulation Profile: No results for input(s): INR, PROTIME in the last 168 hours. Cardiac Enzymes: No results for input(s): CKTOTAL, CKMB, CKMBINDEX, TROPONINI in the last 168 hours. BNP (last 3 results) No results for input(s): PROBNP in the last 8760 hours. HbA1C: No results for input(s): HGBA1C in the last 72 hours. CBG: No results for input(s): GLUCAP in the last 168 hours. Lipid Profile: No results for input(s): CHOL, HDL, LDLCALC, TRIG, CHOLHDL, LDLDIRECT in the last 72 hours. Thyroid Function Tests: No results for input(s): TSH, T4TOTAL, FREET4, T3FREE, THYROIDAB in the last 72 hours. Anemia Panel: No results for input(s): VITAMINB12, FOLATE, FERRITIN, TIBC, IRON, RETICCTPCT in the last 72 hours. Urine analysis:    Component Value Date/Time   COLORURINE YELLOW 02/21/2016 1600   APPEARANCEUR CLEAR 02/21/2016 1600   LABSPEC <1.005 (L) 02/21/2016 1600   PHURINE 7.5 02/21/2016 1600   GLUCOSEU NEGATIVE 02/21/2016 1600   HGBUR NEGATIVE 02/21/2016 1600   BILIRUBINUR NEGATIVE 02/21/2016 1600   KETONESUR NEGATIVE 02/21/2016 1600   PROTEINUR NEGATIVE 02/21/2016 1600   UROBILINOGEN 0.2 10/18/2014 0100   NITRITE NEGATIVE 02/21/2016 1600   LEUKOCYTESUR NEGATIVE  02/21/2016 1600   Sepsis Labs: @LABRCNTIP (procalcitonin:4,lacticidven:4)  ) Recent Results (from the past 240 hour(s))  Blood culture (routine x 2)     Status: None (Preliminary result)   Collection Time: 02/21/16  2:16 PM  Result Value Ref Range Status   Specimen Description BLOOD  Final   Special Requests NONE  Final   Culture NO GROWTH < 24 HOURS  Final   Report Status PENDING  Incomplete  Blood culture (routine x 2)     Status: None (Preliminary result)   Collection Time: 02/21/16  2:34 PM  Result Value Ref Range Status   Specimen Description BLOOD  Final   Special Requests NONE  Final   Culture NO GROWTH < 24 HOURS  Final   Report Status PENDING  Incomplete     Invalid input(s): PROCALCITONIN, LACTICACIDVEN   Radiology Studies: Ct Chest Wo Contrast  Result Date: 02/21/2016 CLINICAL DATA:  45 year old female with chest growth and pain. IV drug abuse. EXAM: CT CHEST WITHOUT CONTRAST TECHNIQUE: Multidetector CT imaging of the chest was performed following the standard protocol without IV contrast. COMPARISON:  10/08/2005 chest CT.  05/03/2015 chest radiograph. FINDINGS: Cardiovascular: Normal heart size. No significant pericardial fluid/thickening. Great vessels are normal in course and caliber. Mediastinum/Nodes: No discrete thyroid nodules. Unremarkable esophagus. Mild right axillary adenopathy measuring up to 1.1 cm (series 3/image 60), new since 10/08/2005. No left axillary adenopathy. Right supraclavicular adenopathy measuring up to 1.1 cm (series 3/image 28), new. Anterior right mediastinal prevascular adenopathy measuring up to 1.0 cm (series 3/ image 52), new. No additional pathologically enlarged mediastinal or gross hilar nodes on this noncontrast study. Lungs/Pleura: No pneumothorax. No pleural effusion. Poorly marginated 1.7 x 1.0 cm peripheral basilar right upper lobe pulmonary nodule (series 4/ image 77) with ground-glass halo, new since 2007. Poorly marginated 6 mm  solid right lower lobe pulmonary nodule (series 4/ image 118), new. Apical left upper lobe 2 mm solid pulmonary nodule (series 4/ image 37) is stable since 2007 and considered benign. No additional significant pulmonary nodules. Upper abdomen: Unremarkable. Musculoskeletal: There is extensive erosive change involving the right sternoclavicular and anterior right first costo manubrial joints with associated large joint effusion causing local mass effect in the right anterior upper mediastinum. The there is an overlying 3.4 x 2.3 x 3.1 cm abscess in the medial ventral right upper chest wall involving the deep subcutaneous soft tissues and medial right pectoral muscle (series 3/ image 37). IMPRESSION: 1. Severe septic arthritis of the right sternoclavicular and right first costomanubrial joints with associated large joint effusion. Orthopedic surgery consultation advised. 2. Overlying 3.4 x 2.3 x 3.1 cm abscess in the medial ventral upper right chest wall involving the deep subcutaneous soft tissues and medial right pectoral muscle. 3. Poorly marginated 1.7 cm basilar right upper lobe and 0.6 cm right lower lobe pulmonary nodules, new since 2007, suspicious for septic emboli given the history of IV drug abuse. Follow-up post treatment chest CT is recommended in 3 months. 4. Mild right axillary, right supraclavicular and anterior right upper mediastinal lymphadenopathy, nonspecific, probably reactive. These nodes can also be reassessed on follow-up chest CT in 3 months. Electronically Signed   By: Ilona Sorrel M.D.   On: 02/21/2016 13:41        Scheduled Meds: . [MAR Hold] piperacillin-tazobactam (ZOSYN)  IV  3.375 g Intravenous Q8H  . [MAR Hold] vancomycin  750 mg Intravenous Q8H   Continuous Infusions: . sodium chloride 125 mL/hr at 02/21/16 2127  . lactated ringers 50 mL/hr at 02/22/16 1106     LOS: 1 day    Time spent: 35 minutes    Julie Levengood A, MD [facility] Pager  [phone]  If 7PM-7AM, please contact night-coverage [email or web address] Password [email or web address] 02/22/2016, 11:11 AM

## 2016-02-22 NOTE — Anesthesia Preprocedure Evaluation (Signed)
Anesthesia Evaluation  Patient identified by MRN, date of birth, ID band Patient awake    Reviewed: Allergy & Precautions, H&P , NPO status , Patient's Chart, lab work & pertinent test results  Airway Mallampati: II   Neck ROM: full    Dental   Pulmonary COPD, Current Smoker,    breath sounds clear to auscultation       Cardiovascular negative cardio ROS   Rhythm:regular Rate:Normal     Neuro/Psych Anxiety    GI/Hepatic (+)     substance abuse  IV drug use, Hepatitis -, C  Endo/Other    Renal/GU      Musculoskeletal  (+) Arthritis ,   Abdominal   Peds  Hematology  (+) anemia ,   Anesthesia Other Findings   Reproductive/Obstetrics                             Anesthesia Physical Anesthesia Plan  ASA: III  Anesthesia Plan: General   Post-op Pain Management:    Induction: Intravenous  Airway Management Planned: Oral ETT  Additional Equipment:   Intra-op Plan:   Post-operative Plan: Extubation in OR  Informed Consent: I have reviewed the patients History and Physical, chart, labs and discussed the procedure including the risks, benefits and alternatives for the proposed anesthesia with the patient or authorized representative who has indicated his/her understanding and acceptance.     Plan Discussed with: CRNA, Anesthesiologist and Surgeon  Anesthesia Plan Comments:         Anesthesia Quick Evaluation

## 2016-02-22 NOTE — Anesthesia Postprocedure Evaluation (Signed)
Anesthesia Post Note  Patient: [name]  Procedure(s) Performed: Procedure(s) (LRB): IRRIGATION AND DEBRIDEMENT MANUBRIUM ABSCESS with wound cultures (N/A)  Patient location during evaluation: PACU Anesthesia Type: General Level of consciousness: awake and alert and patient cooperative Pain management: pain level controlled Vital Signs Assessment: post-procedure vital signs reviewed and stable Respiratory status: spontaneous breathing and respiratory function stable Cardiovascular status: stable Anesthetic complications: no    Last Vitals:  Vitals:   02/22/16 1306 02/22/16 1329  BP: 113/75 102/77  Pulse: 81 78  Resp: 12 18  Temp: 36.8 C 37.1 C    Last Pain:  Vitals:   02/22/16 1329  TempSrc: Oral  PainSc:                  Child[place] S

## 2016-02-22 NOTE — Transfer of Care (Signed)
Immediate Anesthesia Transfer of Care Note  Patient: [name]  Procedure(s) Performed: Procedure(s): IRRIGATION AND DEBRIDEMENT [place] with wound cultures (N/A)  Patient Location: PACU  Anesthesia Type:General  Level of Consciousness: awake  Airway & Oxygen Therapy: Patient Spontanous Breathing  Post-op Assessment: Report given to RN and Post -op Vital signs reviewed and stable  Post vital signs: Reviewed and stable  Last Vitals:  Vitals:   02/22/16 0616 02/22/16 0900  BP: (!) 176/71 130/72  Pulse: (!) 54 65  Resp: 18 18  Temp: 36.7 C 37.1 C    Last Pain:  Vitals:   02/22/16 0900  TempSrc: Oral  PainSc:          Complications: No apparent anesthesia complications

## 2016-02-23 ENCOUNTER — Other Ambulatory Visit (HOSPITAL_COMMUNITY): Payer: Self-pay

## 2016-02-23 ENCOUNTER — Encounter (HOSPITAL_COMMUNITY)
Admission: EM | Disposition: A | Discharge status: Skilled Nursing Facility | Payer: Self-pay | Source: Home / Self Care | Attending: Internal Medicine

## 2016-02-23 ENCOUNTER — Inpatient Hospital Stay (HOSPITAL_COMMUNITY): Payer: Medicare Other | Admitting: Anesthesiology

## 2016-02-23 ENCOUNTER — Inpatient Hospital Stay (HOSPITAL_COMMUNITY): Payer: Medicare Other | Admitting: Certified Registered Nurse Anesthetist

## 2016-02-23 ENCOUNTER — Encounter (HOSPITAL_COMMUNITY): Payer: Self-pay | Admitting: Thoracic Surgery (Cardiothoracic Vascular Surgery)

## 2016-02-23 ENCOUNTER — Inpatient Hospital Stay (HOSPITAL_COMMUNITY): Payer: Medicare Other

## 2016-02-23 DIAGNOSIS — F419 Anxiety disorder, unspecified: Secondary | ICD-10-CM

## 2016-02-23 DIAGNOSIS — B9689 Other specified bacterial agents as the cause of diseases classified elsewhere: Secondary | ICD-10-CM

## 2016-02-23 DIAGNOSIS — B182 Chronic viral hepatitis C: Secondary | ICD-10-CM

## 2016-02-23 DIAGNOSIS — L0291 Cutaneous abscess, unspecified: Secondary | ICD-10-CM

## 2016-02-23 DIAGNOSIS — I38 Endocarditis, valve unspecified: Secondary | ICD-10-CM

## 2016-02-23 DIAGNOSIS — I079 Rheumatic tricuspid valve disease, unspecified: Secondary | ICD-10-CM

## 2016-02-23 DIAGNOSIS — M00811 Arthritis due to other bacteria, right shoulder: Principal | ICD-10-CM

## 2016-02-23 DIAGNOSIS — B965 Pseudomonas (aeruginosa) (mallei) (pseudomallei) as the cause of diseases classified elsewhere: Secondary | ICD-10-CM

## 2016-02-23 DIAGNOSIS — I330 Acute and subacute infective endocarditis: Secondary | ICD-10-CM

## 2016-02-23 DIAGNOSIS — I368 Other nonrheumatic tricuspid valve disorders: Secondary | ICD-10-CM

## 2016-02-23 HISTORY — PX: TEE WITHOUT CARDIOVERSION: SHX5443

## 2016-02-23 HISTORY — DX: Rheumatic tricuspid valve disease, unspecified: I07.9

## 2016-02-23 HISTORY — PX: STERNAL WOUND DEBRIDEMENT: SHX1058

## 2016-02-23 LAB — BLOOD CULTURE ID PANEL (REFLEXED)

## 2016-02-23 LAB — BASIC METABOLIC PANEL
Anion gap: 7 (ref 5–15)
BUN: 12 mg/dL (ref 6–20)
CO2: 24 mmol/L (ref 22–32)
Calcium: 8.1 mg/dL — ABNORMAL LOW (ref 8.9–10.3)
Chloride: 106 mmol/L (ref 101–111)
Creatinine, Ser: 1.42 mg/dL — ABNORMAL HIGH (ref 0.44–1.00)
GFR calc Af Amer: 51 mL/min — ABNORMAL LOW (ref >60)
GFR calc non Af Amer: 44 mL/min — ABNORMAL LOW (ref >60)
Glucose, Bld: 146 mg/dL — ABNORMAL HIGH (ref 65–99)
Potassium: 3.9 mmol/L (ref 3.5–5.1)
Sodium: 137 mmol/L (ref 135–145)

## 2016-02-23 LAB — I-STAT BETA HCG BLOOD, ED (NOT ORDERABLE): I-stat hCG, quantitative: <5 m[IU]/mL (ref <5)

## 2016-02-23 LAB — HEPATITIS B SURFACE ANTIGEN: Hepatitis B Surface Ag: NEGATIVE

## 2016-02-23 LAB — HEPATITIS B SURFACE ANTIBODY, QUANTITATIVE: Hepatitis B-Post: <3.1 m[IU]/mL — ABNORMAL LOW (ref >9.9)

## 2016-02-23 LAB — HIV ANTIBODY (ROUTINE TESTING W REFLEX): HIV Screen 4th Generation wRfx: NONREACTIVE

## 2016-02-23 LAB — RPR: RPR Ser Ql: NONREACTIVE

## 2016-02-23 SURGERY — ECHOCARDIOGRAM, TRANSESOPHAGEAL
Anesthesia: Monitor Anesthesia Care

## 2016-02-23 SURGERY — DEBRIDEMENT, WOUND, STERNUM
Anesthesia: General

## 2016-02-23 MED ORDER — PROPOFOL 10 MG/ML IV BOLUS
INTRAVENOUS | Status: DC | PRN
  Administered 2016-02-23: 20 mg via INTRAVENOUS

## 2016-02-23 MED ORDER — KETAMINE HCL 10 MG/ML IJ SOLN
INTRAMUSCULAR | Status: DC | PRN
  Administered 2016-02-23: 30 mg via INTRAVENOUS
  Administered 2016-02-23 (×2): 10 mg via INTRAVENOUS

## 2016-02-23 MED ORDER — LIDOCAINE HCL (CARDIAC) 20 MG/ML IV SOLN
INTRAVENOUS | Status: DC | PRN
  Administered 2016-02-23: 20 mg via INTRAVENOUS

## 2016-02-23 MED ORDER — PROPOFOL 10 MG/ML IV BOLUS
INTRAVENOUS | Status: DC | PRN
Start: 2016-02-23 — End: 2016-02-23
  Administered 2016-02-23: 100 mg via INTRAVENOUS
  Administered 2016-02-23: 50 mg via INTRAVENOUS

## 2016-02-23 MED ORDER — MIDAZOLAM HCL 5 MG/5ML IJ SOLN
INTRAMUSCULAR | Status: DC | PRN
  Administered 2016-02-23: 2 mg via INTRAVENOUS

## 2016-02-23 MED ORDER — DEXTROSE 5 % IV SOLN
2.0000 g | INTRAVENOUS | Status: DC
  Filled 2016-02-23: qty 2

## 2016-02-23 MED ORDER — 0.9 % SODIUM CHLORIDE (POUR BTL) OPTIME
TOPICAL | Status: DC | PRN
  Administered 2016-02-23: 1000 mL

## 2016-02-23 MED ORDER — KETAMINE HCL-SODIUM CHLORIDE 100-0.9 MG/10ML-% IV SOSY
PREFILLED_SYRINGE | INTRAVENOUS | Status: AC
  Filled 2016-02-23: qty 10

## 2016-02-23 MED ORDER — FENTANYL CITRATE (PF) 100 MCG/2ML IJ SOLN
INTRAMUSCULAR | Status: AC
  Filled 2016-02-23: qty 4

## 2016-02-23 MED ORDER — SUCCINYLCHOLINE CHLORIDE 20 MG/ML IJ SOLN
INTRAMUSCULAR | Status: DC | PRN
  Administered 2016-02-23: 100 mg via INTRAVENOUS

## 2016-02-23 MED ORDER — CEFEPIME HCL 2 G IJ SOLR
2.0000 g | Freq: Two times a day (BID) | INTRAMUSCULAR | Status: DC
  Filled 2016-02-23 (×2): qty 2

## 2016-02-23 MED ORDER — ONDANSETRON HCL 4 MG/2ML IJ SOLN
INTRAMUSCULAR | Status: DC | PRN
  Administered 2016-02-23: 4 mg via INTRAVENOUS

## 2016-02-23 MED ORDER — BUTAMBEN-TETRACAINE-BENZOCAINE 2-2-14 % EX AERO
INHALATION_SPRAY | CUTANEOUS | Status: DC | PRN
  Administered 2016-02-23: 2 via TOPICAL

## 2016-02-23 MED ORDER — FENTANYL CITRATE (PF) 100 MCG/2ML IJ SOLN: 25.0000 ug | INTRAMUSCULAR | Status: DC | PRN

## 2016-02-23 MED ORDER — FENTANYL CITRATE (PF) 100 MCG/2ML IJ SOLN
INTRAMUSCULAR | Status: DC | PRN
  Administered 2016-02-23: 100 ug via INTRAVENOUS
  Administered 2016-02-23 (×2): 50 ug via INTRAVENOUS

## 2016-02-23 MED ORDER — PROPOFOL 10 MG/ML IV BOLUS
INTRAVENOUS | Status: AC
  Filled 2016-02-23: qty 40

## 2016-02-23 MED ORDER — ONDANSETRON HCL 4 MG/2ML IJ SOLN: 4.0000 mg | Freq: Once | INTRAMUSCULAR | Status: DC | PRN

## 2016-02-23 MED ORDER — MIDAZOLAM HCL 2 MG/2ML IJ SOLN
INTRAMUSCULAR | Status: AC
  Filled 2016-02-23: qty 2

## 2016-02-23 MED ORDER — PIPERACILLIN-TAZOBACTAM 3.375 G IVPB
3.3750 g | Freq: Three times a day (TID) | INTRAVENOUS | Status: DC
  Administered 2016-02-23 – 2016-02-25 (×6): 3.375 g via INTRAVENOUS
  Filled 2016-02-23 (×10): qty 50

## 2016-02-23 MED ORDER — PIPERACILLIN-TAZOBACTAM 3.375 G IVPB
3.3750 g | Freq: Three times a day (TID) | INTRAVENOUS | Status: DC
  Filled 2016-02-23: qty 50

## 2016-02-23 MED ORDER — PROPOFOL 500 MG/50ML IV EMUL
INTRAVENOUS | Status: DC | PRN
  Administered 2016-02-23: 50 ug/kg/min via INTRAVENOUS

## 2016-02-23 SURGICAL SUPPLY — 41 items
BLADE SURG 10 STRL SS (BLADE) ×2 IMPLANT
CANISTER SUCTION 2500CC (MISCELLANEOUS) ×2 IMPLANT
CONT SPEC 4OZ CLIKSEAL STRL BL (MISCELLANEOUS) IMPLANT
COVER SURGICAL LIGHT HANDLE (MISCELLANEOUS) ×2 IMPLANT
DRAPE LAPAROSCOPIC ABDOMINAL (DRAPES) ×2 IMPLANT
DRAPE SLUSH/WARMER DISC (DRAPES) IMPLANT
DRSG PAD ABDOMINAL 8X10 ST (GAUZE/BANDAGES/DRESSINGS) IMPLANT
DRSG VAC ATS SM SENSATRAC (GAUZE/BANDAGES/DRESSINGS) ×2 IMPLANT
DRSG VERSA FOAM LRG 10X15 (GAUZE/BANDAGES/DRESSINGS) ×2 IMPLANT
ELECT REM PT RETURN 9FT ADLT (ELECTROSURGICAL) ×2
ELECTRODE REM PT RTRN 9FT ADLT (ELECTROSURGICAL) ×1 IMPLANT
GAUZE SPONGE 4X4 12PLY STRL (GAUZE/BANDAGES/DRESSINGS) ×2 IMPLANT
GLOVE EUDERMIC 7 POWDERFREE (GLOVE) ×4 IMPLANT
GOWN STRL REUS W/ TWL LRG LVL3 (GOWN DISPOSABLE) ×2 IMPLANT
GOWN STRL REUS W/ TWL XL LVL3 (GOWN DISPOSABLE) ×1 IMPLANT
GOWN STRL REUS W/TWL LRG LVL3 (GOWN DISPOSABLE) ×4
GOWN STRL REUS W/TWL XL LVL3 (GOWN DISPOSABLE) ×1
HANDPIECE INTERPULSE COAX TIP (DISPOSABLE)
HEMOSTAT POWDER SURGIFOAM 1G (HEMOSTASIS) IMPLANT
KIT BASIN OR (CUSTOM PROCEDURE TRAY) ×2 IMPLANT
KIT ROOM TURNOVER OR (KITS) ×2 IMPLANT
NS IRRIG 1000ML POUR BTL (IV SOLUTION) ×4 IMPLANT
PACK CHEST (CUSTOM PROCEDURE TRAY) ×2 IMPLANT
PAD ARMBOARD 7.5X6 YLW CONV (MISCELLANEOUS) ×4 IMPLANT
PAD NEG PRESSURE SENSATRAC (MISCELLANEOUS) ×2 IMPLANT
SET HNDPC FAN SPRY TIP SCT (DISPOSABLE) IMPLANT
SPONGE LAP 18X18 X RAY DECT (DISPOSABLE) ×2 IMPLANT
SUT STEEL 6MS V (SUTURE) IMPLANT
SUT STEEL STERNAL CCS#1 18IN (SUTURE) IMPLANT
SUT STEEL SZ 6 DBL 3X14 BALL (SUTURE) IMPLANT
SUT VIC AB 1 CTX 36 (SUTURE)
SUT VIC AB 1 CTX36XBRD ANBCTR (SUTURE) IMPLANT
SUT VIC AB 2-0 CTX 27 (SUTURE) IMPLANT
SUT VIC AB 3-0 X1 27 (SUTURE) IMPLANT
SWAB COLLECTION DEVICE MRSA (MISCELLANEOUS) IMPLANT
SYR 5ML LL (SYRINGE) IMPLANT
TOWEL OR 17X24 6PK STRL BLUE (TOWEL DISPOSABLE) ×2 IMPLANT
TOWEL OR 17X26 10 PK STRL BLUE (TOWEL DISPOSABLE) ×2 IMPLANT
TRAY FOLEY CATH 14FRSI W/METER (CATHETERS) IMPLANT
TUBE ANAEROBIC SPECIMEN COL (MISCELLANEOUS) IMPLANT
WATER STERILE IRR 1000ML POUR (IV SOLUTION) ×2 IMPLANT

## 2016-02-23 NOTE — Anesthesia Preprocedure Evaluation (Signed)
Anesthesia Evaluation  Patient identified by MRN, date of birth, ID band Patient awake    Reviewed: Allergy & Precautions, NPO status , Patient's Chart, lab work & pertinent test results  Airway Mallampati: II  TM Distance: >3 FB Neck ROM: Full    Dental  (+) Teeth Intact, Dental Advisory Given   Pulmonary Current Smoker,     + decreased breath sounds      Cardiovascular  Rhythm:Regular Rate:Normal     Neuro/Psych    GI/Hepatic   Endo/Other    Renal/GU      Musculoskeletal   Abdominal   Peds  Hematology   Anesthesia Other Findings   Reproductive/Obstetrics                             Anesthesia Physical Anesthesia Plan  ASA: III  Anesthesia Plan: MAC   Post-op Pain Management:    Induction: Intravenous  Airway Management Planned: Natural Airway and Nasal Cannula  Additional Equipment:   Intra-op Plan:   Post-operative Plan:   Informed Consent: I have reviewed the patients History and Physical, chart, labs and discussed the procedure including the risks, benefits and alternatives for the proposed anesthesia with the patient or authorized representative who has indicated his/her understanding and acceptance.   Dental advisory given  Plan Discussed with: CRNA and Anesthesiologist  Anesthesia Plan Comments:         Anesthesia Quick Evaluation

## 2016-02-23 NOTE — Anesthesia Procedure Notes (Signed)
Procedure Name: Intubation Performed by: Valda Favia Pre-anesthesia Checklist: Patient identified, Emergency Drugs available, Suction available, Patient being monitored and Timeout performed Patient Re-evaluated:Patient Re-evaluated prior to inductionOxygen Delivery Method: Circle system utilized Preoxygenation: Pre-oxygenation with 100% oxygen Intubation Type: IV induction Ventilation: Mask ventilation without difficulty Laryngoscope Size: Mac and 4 Grade View: Grade I Tube type: Oral Tube size: 7.0 mm Number of attempts: 1 Airway Equipment and Method: Stylet Placement Confirmation: ETT inserted through vocal cords under direct vision,  positive ETCO2 and breath sounds checked- equal and bilateral Secured at: 21 cm Tube secured with: Tape Dental Injury: Teeth and Oropharynx as per pre-operative assessment

## 2016-02-23 NOTE — Progress Notes (Signed)
  Echocardiogram Echocardiogram Transesophageal has been performed.  Johny Chess 02/23/2016, 11:34 AM

## 2016-02-23 NOTE — Progress Notes (Signed)
1 Day Post-Op Procedure(s) (LRB): IRRIGATION AND DEBRIDEMENT MANUBRIUM ABSCESS with wound cultures (N/A) Subjective: "i'm making it" Has pain at surgical site  Objective: Vital signs in last 24 hours: Temp:  [98.1 F (36.7 C)-98.8 F (37.1 C)] 98.4 F (36.9 C) (11/13 0700) Pulse Rate:  [65-90] 72 (11/13 0700) Cardiac Rhythm: Normal sinus rhythm (11/12 1306) Resp:  [12-18] 17 (11/13 0700) BP: (102-140)/(64-80) 140/80 (11/13 0700) SpO2:  [92 %-99 %] 98 % (11/13 0700) Weight:  [168 lb 3.4 oz (76.3 kg)] 168 lb 3.4 oz (76.3 kg) (11/12 2040)  Hemodynamic parameters for last 24 hours:    Intake/Output from previous day: 11/12 0701 - 11/13 0700 In: 2711.3 [P.O.:720; I.V.:1691.3; IV Piggyback:300] Out: 30 [Blood:30] Intake/Output this shift: No intake/output data recorded.  General appearance: alert, cooperative and no distress Neurologic: intact Heart: regular rate and rhythm Lungs: clear to auscultation bilaterally Wound: dressing in place  Lab Results:  Recent Labs  02/21/16 1415 02/22/16 0610  WBC 10.9* 7.8  HGB 10.1* 9.0*  HCT 31.0* 28.1*  PLT 433* 363   BMET:  Recent Labs  02/22/16 0610 02/23/16 0445  NA 138 137  K 3.8 3.9  CL 106 106  CO2 26 24  GLUCOSE 115* 146*  BUN 7 12  CREATININE 0.80 1.42*  CALCIUM 8.6* 8.1*    PT/INR:  Recent Labs  02/22/16 1037  LABPROT 13.6  INR 1.04   ABG No results found for: PHART, HCO3, TCO2, ACIDBASEDEF, O2SAT CBG (last 3)  No results for input(s): GLUCAP in the last 72 hours.  Assessment/Plan: S/P Procedure(s) (LRB): IRRIGATION AND DEBRIDEMENT MANUBRIUM ABSCESS with wound cultures (N/A) -sternoclavicular abscess S/p I & D On vanco and ceftriaxone Plan dressing change under anesthesia later today, possible VAC placement   LOS: 2 days    [name] 02/23/2016

## 2016-02-23 NOTE — Anesthesia Preprocedure Evaluation (Addendum)
Anesthesia Evaluation  Patient identified by MRN, date of birth, ID band Patient awake    Reviewed: Allergy & Precautions, NPO status , Patient's Chart, lab work & pertinent test results  History of Anesthesia Complications Negative for: history of anesthetic complications  Airway Mallampati: II  TM Distance: >3 FB Neck ROM: Full    Dental  (+) Chipped, Missing, Dental Advisory Given   Pulmonary COPD, Current Smoker,    breath sounds clear to auscultation       Cardiovascular  Rhythm:Regular Rate:Normal  Presumed bacterial endocarditis   Neuro/Psych PSYCHIATRIC DISORDERS Anxiety Depression negative neurological ROS     GI/Hepatic negative GI ROS, (+)     substance abuse  cocaine use, marijuana use, methamphetamine use and IV drug use, Hepatitis -, C  Endo/Other  negative endocrine ROSMorbid obesity  Renal/GU negative Renal ROS     Musculoskeletal   Abdominal (+) + obese,   Peds  Hematology Hb 9.0   Anesthesia Other Findings   Reproductive/Obstetrics                            Anesthesia Physical Anesthesia Plan  ASA: III  Anesthesia Plan: General   Post-op Pain Management:    Induction: Intravenous  Airway Management Planned: Oral ETT  Additional Equipment:   Intra-op Plan:   Post-operative Plan: Extubation in OR  Informed Consent: I have reviewed the patients History and Physical, chart, labs and discussed the procedure including the risks, benefits and alternatives for the proposed anesthesia with the patient or authorized representative who has indicated his/her understanding and acceptance.   Dental advisory given  Plan Discussed with: CRNA and Surgeon  Anesthesia Plan Comments: (Plan routine monitors, GETA)        Anesthesia Quick Evaluation                                   Anesthesia Evaluation  Patient identified by MRN, date of birth, ID band Patient  awake    Reviewed: Allergy & Precautions, NPO status , Patient's Chart, lab work & pertinent test results  Airway Mallampati: II  TM Distance: >3 FB Neck ROM: Full    Dental  (+) Teeth Intact, Dental Advisory Given   Pulmonary Current Smoker,     + decreased breath sounds      Cardiovascular  Rhythm:Regular Rate:Normal     Neuro/Psych    GI/Hepatic   Endo/Other    Renal/GU      Musculoskeletal   Abdominal   Peds  Hematology   Anesthesia Other Findings   Reproductive/Obstetrics                             Anesthesia Physical Anesthesia Plan  ASA: III  Anesthesia Plan: MAC   Post-op Pain Management:    Induction: Intravenous  Airway Management Planned: Natural Airway and Nasal Cannula  Additional Equipment:   Intra-op Plan:   Post-operative Plan:   Informed Consent: I have reviewed the patients History and Physical, chart, labs and discussed the procedure including the risks, benefits and alternatives for the proposed anesthesia with the patient or authorized representative who has indicated his/her understanding and acceptance.   Dental advisory given  Plan Discussed with: CRNA and Anesthesiologist  Anesthesia Plan Comments:         Anesthesia Quick Evaluation

## 2016-02-23 NOTE — Progress Notes (Addendum)
PROGRESS NOTE  [name]  [id number] DOB: June 02, 1970 DOA: 02/21/2016 PCP: [name] Patient Outpatient Specialists:  Subjective: Denies any fever or chills this morning, has some pain over her right chest side. She will have TEE and wound VAC to be placed later today.  Brief Narrative:  [name] is a 44 y.o. female with a history of IV drug use (primarily heroin), hepatitis C. Patient has swollen area above her sternoclavicular joint with pain that radiates into her shoulder. Patient had been seen by Dr. Aline Brochure for complaints of some swelling and prominence over the right sternoclavicular joint on 01/26/16. Dr. Aline Brochure ordered a CT, however the patient never followed up for that. She continues to have pain over the past several weeks, but suddenly had redness and extreme swelling over the past 24 hours. Ibuprofen helps mildly with pain. Patient has been using heroin IV - does about 30 g a day. Last drug use was 10 AM. Denies fevers or chills.  Assessment & Plan:   Principal Problem:   Septic arthritis of right sternoclavicular joint (HCC) Active Problems:   Obesity   Polysubstance abuse   Depression   IV drug abuse   Hepatitis C infection   COPD (chronic obstructive pulmonary disease) (HCC)   Abscess of chest (HCC)   Opioid abuse   Pulmonary embolism, septic (HCC)   Anxiety   Cigarette smoker   Septic arthritis of the right sternoclavicular joint -History of IVDU, presents with right sternoclavicular joint swelling, pain and low-grade fever past several weeks. -CT scan showed septic arthritis, seen by CTS, Dr. Roxan Hockey. -Taken to the OR on 02/22/2016, and fortunately seems Zosyn and vancomycin on 11/11 prior to obtaining cultures. -Has left upper extremity PICC line placed on 02/21/2016 at [facility]. -ID consulted, antibiotic switch to vancomycin and Rocephin.  Bacteremia  -1/2 culture shows gram-negative bacteria. Await ID recommendation. -Likely to repeat  the blood cultures, await timing from Dr. Megan Salon  Tricuspid valve endocarditis -TEE done earlier today and showed small vegetation involving the septal leaflet of the tricuspid valve. -Vegetation measures 0.80.2 cm without evidence of tricuspid valve deterioration.  Septic pulmonary embolism -2 nodules one measures 1.7 cm and the other one 0.6 cm suspicious for septic emboli in the RML. -TEE to be done today, 2-D echo ordered but not sure how good the approach will be as she will have wound VAC. -Blood cultures obtained, continue vancomycin and Rocephin  Acute kidney injury -Creatinine on admission 0.8, increased to 1.4 today. -Cannot rule out vancomycin as offensive organism, discontinue Toradol, increase IV fluids to 100 mL/hour -Check BMP in a.m.  IV drug use -Drug of choice is heroin, this is probably a golden opportunity for detoxification. -Started on Bentyl, Imodium and clonidine as needed. -IV morphine as needed to control withdrawal symptoms and for postoperative pain. -The goal overall is to taper off the opioids to be discontinued prior to discharge. -I think we will face the dilemma of sending heroin abuser to home with IV antibiotics and central line.  Hepatitis C -Appears to be has chronic hepatitis, no transaminitis or coagulopathy -Recheck HIV (last test was in April 2016)  DVT prophylaxis: SCDs will start heparin postoperatively Code Status: Full Code Family Communication:  Disposition Plan:  Diet: Diet NPO time specified Except for: Sips with Meds  Consultants:   None  Procedures:   None  Antimicrobials:   None  Objective: Vitals:   02/22/16 2040 02/23/16 0538 02/23/16 0700 02/23/16 1001  BP:  119/67 131/79 140/80 (!) 144/93  Pulse: 87 70 72 61  Resp: 18 18 17  (!) 9  Temp: 98.1 F (36.7 C) 98.3 F (36.8 C) 98.4 F (36.9 C) 98.2 F (36.8 C)  TempSrc: Oral Oral Oral Oral  SpO2: 99% 96% 98% 97%  Weight: 76.3 kg (168 lb 3.4 oz)     Height:         Intake/Output Summary (Last 24 hours) at 02/23/16 1103 Last data filed at 02/23/16 1045  Gross per 24 hour  Intake          2711.25 ml  Output               30 ml  Net          2681.25 ml   Filed Weights   02/21/16 1125 02/21/16 1949 02/22/16 2040  Weight: 63.5 kg (140 lb) 76.1 kg (167 lb 12.3 oz) 76.3 kg (168 lb 3.4 oz)    Examination: General exam: Appears calm and comfortable  Respiratory system: Clear to auscultation. Respiratory effort normal. Cardiovascular system: S1 & S2 heard, RRR. No JVD, murmurs, rubs, gallops or clicks. No pedal edema. Gastrointestinal system: Abdomen is nondistended, soft and nontender. No organomegaly or masses felt. Normal bowel sounds heard. Central nervous system: Alert and oriented. No focal neurological deficits. Extremities: Symmetric 5 x 5 power. Skin: No rashes, lesions or ulcers Psychiatry: Judgement and insight appear normal. Mood & affect appropriate.   Data Reviewed: I have personally reviewed following labs and imaging studies  CBC:  Recent Labs Lab 02/21/16 1415 02/22/16 0610  WBC 10.9* 7.8  NEUTROABS 9.4*  --   HGB 10.1* 9.0*  HCT 31.0* 28.1*  MCV 79.7 78.7  PLT 433* [id number]   Basic Metabolic Panel:  Recent Labs Lab 02/21/16 1415 02/22/16 0610 02/23/16 0445  NA 136 138 137  K 3.8 3.8 3.9  CL 103 106 106  CO2 26 26 24   GLUCOSE 88 115* 146*  BUN 7 7 12   CREATININE 0.59 0.80 1.42*  CALCIUM 8.7* 8.6* 8.1*   GFR: Estimated Creatinine Clearance: 47.2 mL/min (by C-G formula based on SCr of 1.42 mg/dL (H)). Liver Function Tests:  Recent Labs Lab 02/21/16 1415  AST 12*  ALT 12*  ALKPHOS 73  BILITOT 0.7  PROT 7.5  ALBUMIN 2.7*   No results for input(s): LIPASE, AMYLASE in the last 168 hours. No results for input(s): AMMONIA in the last 168 hours. Coagulation Profile:  Recent Labs Lab 02/22/16 1037  INR 1.04   Cardiac Enzymes: No results for input(s): CKTOTAL, CKMB, CKMBINDEX, TROPONINI in the last 168  hours. BNP (last 3 results) No results for input(s): PROBNP in the last 8760 hours. HbA1C: No results for input(s): HGBA1C in the last 72 hours. CBG: No results for input(s): GLUCAP in the last 168 hours. Lipid Profile: No results for input(s): CHOL, HDL, LDLCALC, TRIG, CHOLHDL, LDLDIRECT in the last 72 hours. Thyroid Function Tests: No results for input(s): TSH, T4TOTAL, FREET4, T3FREE, THYROIDAB in the last 72 hours. Anemia Panel: No results for input(s): VITAMINB12, FOLATE, FERRITIN, TIBC, IRON, RETICCTPCT in the last 72 hours. Urine analysis:    Component Value Date/Time   COLORURINE YELLOW 02/21/2016 1600   APPEARANCEUR CLEAR 02/21/2016 1600   LABSPEC <1.005 (L) 02/21/2016 1600   PHURINE 7.5 02/21/2016 1600   GLUCOSEU NEGATIVE 02/21/2016 1600   HGBUR NEGATIVE 02/21/2016 1600   BILIRUBINUR NEGATIVE 02/21/2016 1600   KETONESUR NEGATIVE 02/21/2016 1600   PROTEINUR NEGATIVE 02/21/2016 1600  UROBILINOGEN 0.2 10/18/2014 0100   NITRITE NEGATIVE 02/21/2016 1600   LEUKOCYTESUR NEGATIVE 02/21/2016 1600   Sepsis Labs: @LABRCNTIP (procalcitonin:4,lacticidven:4)  ) Recent Results (from the past 240 hour(s))  Blood culture (routine x 2)     Status: None (Preliminary result)   Collection Time: 02/21/16  2:16 PM  Result Value Ref Range Status   Specimen Description LEFT ANTECUBITAL  Final   Special Requests BOTTLES DRAWN AEROBIC AND ANAEROBIC 6CC EACH  Final   Culture  Setup Time   Final    GRAM NEGATIVE RODS RECOVERED FROM THE AEROBIC BOTTLE Gram Stain Report Called to,Read Back By and Verified With: YOUNG, J AT 0750 BY HUFFINES,S ON 02/23/16 RIOS,A. [facility]) AT 0935 ON 02/23/2016 BY BAUGHAM,M. Performed at [facility]    Culture PENDING  Incomplete   Report Status PENDING  Incomplete  Blood culture (routine x 2)     Status: None (Preliminary result)   Collection Time: 02/21/16  2:34 PM  Result Value Ref Range Status   Specimen Description BLOOD LEFT HAND  Final   Special  Requests BOTTLES DRAWN AEROBIC AND ANAEROBIC 8CC EACH  Final   Culture NO GROWTH 2 DAYS  Final   Report Status PENDING  Incomplete  Surgical pcr screen     Status: None   Collection Time: 02/22/16 10:34 AM  Result Value Ref Range Status   MRSA, PCR NEGATIVE NEGATIVE Final   Staphylococcus aureus NEGATIVE NEGATIVE Final    Comment:        The Xpert SA Assay (FDA approved for NASAL specimens in patients over 24 years of age), is one component of a comprehensive surveillance program.  Test performance has been validated by [facility] for patients greater than or equal to 64 year old. It is not intended to diagnose infection nor to guide or monitor treatment.   Aerobic Culture (superficial specimen)     Status: None (Preliminary result)   Collection Time: 02/22/16 11:57 AM  Result Value Ref Range Status   Specimen Description ABSCESS  Final   Special Requests   Final    STERNOCLAVICULAR SPECIMEN A PATIENT ON FOLLOWING  VANCOMYCIN   Gram Stain   Final    ABUNDANT WBC PRESENT, PREDOMINANTLY PMN NO ORGANISMS SEEN    Culture CULTURE REINCUBATED FOR BETTER GROWTH  Final   Report Status PENDING  Incomplete  Aerobic/Anaerobic Culture (surgical/deep wound)     Status: None (Preliminary result)   Collection Time: 02/22/16 12:00 PM  Result Value Ref Range Status   Specimen Description ABSCESS  Final   Special Requests   Final    STERNOCLAVICULAR SPECIMEN B PATIENT ON FOLLOWING  VANCOMYCIN   Gram Stain   Final    ABUNDANT WBC PRESENT,BOTH PMN AND MONONUCLEAR NO ORGANISMS SEEN Gram Stain Report Called to,Read Back By and Verified With: B GORDON,RN AT [id number] 02/22/16 BY L BENFIELD    Culture FEW GRAM NEGATIVE RODS  Final   Report Status PENDING  Incomplete     Invalid input(s): PROCALCITONIN, LACTICACIDVEN   Radiology Studies: Ct Chest Wo Contrast  Result Date: 02/21/2016 CLINICAL DATA:  45 year old female with chest growth and pain. IV drug abuse. EXAM: CT CHEST WITHOUT  CONTRAST TECHNIQUE: Multidetector CT imaging of the chest was performed following the standard protocol without IV contrast. COMPARISON:  10/08/2005 chest CT.  05/03/2015 chest radiograph. FINDINGS: Cardiovascular: Normal heart size. No significant pericardial fluid/thickening. Great vessels are normal in course and caliber. Mediastinum/Nodes: No discrete thyroid nodules. Unremarkable esophagus. Mild right axillary adenopathy  measuring up to 1.1 cm (series 3/image 60), new since 10/08/2005. No left axillary adenopathy. Right supraclavicular adenopathy measuring up to 1.1 cm (series 3/image 28), new. Anterior right mediastinal prevascular adenopathy measuring up to 1.0 cm (series 3/ image 52), new. No additional pathologically enlarged mediastinal or gross hilar nodes on this noncontrast study. Lungs/Pleura: No pneumothorax. No pleural effusion. Poorly marginated 1.7 x 1.0 cm peripheral basilar right upper lobe pulmonary nodule (series 4/ image 77) with ground-glass halo, new since 2007. Poorly marginated 6 mm solid right lower lobe pulmonary nodule (series 4/ image 118), new. Apical left upper lobe 2 mm solid pulmonary nodule (series 4/ image 37) is stable since 2007 and considered benign. No additional significant pulmonary nodules. Upper abdomen: Unremarkable. Musculoskeletal: There is extensive erosive change involving the right sternoclavicular and anterior right first costo manubrial joints with associated large joint effusion causing local mass effect in the right anterior upper mediastinum. The there is an overlying 3.4 x 2.3 x 3.1 cm abscess in the medial ventral right upper chest wall involving the deep subcutaneous soft tissues and medial right pectoral muscle (series 3/ image 37). IMPRESSION: 1. Severe septic arthritis of the right sternoclavicular and right first costomanubrial joints with associated large joint effusion. Orthopedic surgery consultation advised. 2. Overlying 3.4 x 2.3 x 3.1 cm abscess in  the medial ventral upper right chest wall involving the deep subcutaneous soft tissues and medial right pectoral muscle. 3. Poorly marginated 1.7 cm basilar right upper lobe and 0.6 cm right lower lobe pulmonary nodules, new since 2007, suspicious for septic emboli given the history of IV drug abuse. Follow-up post treatment chest CT is recommended in 3 months. 4. Mild right axillary, right supraclavicular and anterior right upper mediastinal lymphadenopathy, nonspecific, probably reactive. These nodes can also be reassessed on follow-up chest CT in 3 months. Electronically Signed   By: Ilona Sorrel M.D.   On: 02/21/2016 13:41        Scheduled Meds: . [MAR Hold] cefTRIAXone (ROCEPHIN)  IV  2 g Intravenous Q24H   Continuous Infusions: . sodium chloride 75 mL/hr at 02/22/16 1353     LOS: 2 days    Time spent: 35 minutes    Jahnia Hewes A, MD [facility] Pager [phone]  If 7PM-7AM, please contact night-coverage [email or web address] Password [email or web address] 02/23/2016, 11:03 AM

## 2016-02-23 NOTE — Progress Notes (Signed)
PHARMACY - PHYSICIAN COMMUNICATION CRITICAL VALUE ALERT - BLOOD CULTURE IDENTIFICATION (BCID)  Results for orders placed or performed during the hospital encounter of 02/21/16  Blood Culture ID Panel (Reflexed) (Collected: 02/21/2016  2:16 PM)  Result Value Ref Range   Enterococcus species NOT DETECTED NOT DETECTED   Listeria monocytogenes NOT DETECTED NOT DETECTED   Staphylococcus species NOT DETECTED NOT DETECTED   Staphylococcus aureus NOT DETECTED NOT DETECTED   Streptococcus species NOT DETECTED NOT DETECTED   Streptococcus agalactiae NOT DETECTED NOT DETECTED   Streptococcus pneumoniae NOT DETECTED NOT DETECTED   Streptococcus pyogenes NOT DETECTED NOT DETECTED   Acinetobacter baumannii NOT DETECTED NOT DETECTED   Enterobacteriaceae species NOT DETECTED NOT DETECTED   Enterobacter cloacae complex NOT DETECTED NOT DETECTED   Escherichia coli NOT DETECTED NOT DETECTED   Klebsiella oxytoca NOT DETECTED NOT DETECTED   Klebsiella pneumoniae NOT DETECTED NOT DETECTED   Proteus species NOT DETECTED NOT DETECTED   Serratia marcescens NOT DETECTED NOT DETECTED   Haemophilus influenzae NOT DETECTED NOT DETECTED   Neisseria meningitidis NOT DETECTED NOT DETECTED   Pseudomonas aeruginosa NOT DETECTED NOT DETECTED   Candida albicans NOT DETECTED NOT DETECTED   Candida glabrata NOT DETECTED NOT DETECTED   Candida krusei NOT DETECTED NOT DETECTED   Candida parapsilosis NOT DETECTED NOT DETECTED   Candida tropicalis NOT DETECTED NOT DETECTED    Name of physician (or Provider) Contacted: R. Comer  BCID did not detect any GNRs. Lab says cx worrisome for anaerobe. Could be gram negative anaerobe like Eikenella especially since patient had recent infection with this bacteria. Discussed with Dr. Linus Salmons and will change patient to Zosyn for now.   Changes to prescribed antibiotics required: Stop cefepime and start [place], PharmD, [place] Pharmacist Pager  [phone] 02/23/2016 3:08 PM

## 2016-02-23 NOTE — Interval H&P Note (Signed)
History and Physical Interval Note:  02/23/2016 9:04 AM  [name]  has presented today for surgery, with the diagnosis of endocarditis  The various methods of treatment have been discussed with the patient and family. After consideration of risks, benefits and other options for treatment, the patient has consented to  Procedure(s): TRANSESOPHAGEAL ECHOCARDIOGRAM (TEE) (N/A) as a surgical intervention .  The patient's history has been reviewed, patient examined, no change in status, stable for surgery.  I have reviewed the patient's chart and labs.  Questions were answered to the patient's satisfaction.     [employer]

## 2016-02-23 NOTE — Progress Notes (Signed)
    [place] for Infectious Disease   Reason for visit: Follow up on endocarditis and right [place] joint infection  Interval History: has grown Pseudomonas in culture from [facility] joint debridement, GNR in 1 blood culture that BCID does not identify as Pseudomonas.  Patient currently in OR for dressing change, possible VAC placement.    Physical Exam: Constitutional:  Vitals:   02/23/16 1110 02/23/16 1125  BP: 140/83 125/80  Pulse: 64 66  Resp: (!) 9 14  Temp:  97.4 F (36.3 C)    Impression: Endocarditis and [place] arthitis with Pseudomonas.  She likely has 2 different organisms and will await speciation of operative culture from yesterday.  She will need prolonged antibiotics and placement for it.   Plan: 1.  I will change to zosyn pending ID of blood culture,.

## 2016-02-23 NOTE — Progress Notes (Signed)
Came back from PACU at this time,with wound Vac at 125 mmHg. Alert and oriented.not complaining of any pain.

## 2016-02-23 NOTE — H&P (View-Only) (Signed)
[place] for Infectious Disease    Date of Admission:  02/21/2016           Day 2 vancomycin        Day 2 piperacillin tazobactam       Reason for Consult: Smoldering right sternoclavicular joint septic arthritis with abscess    Referring Physician: Dr. Merilynn Finland   Principal Problem:   Septic arthritis of right sternoclavicular joint [facility]) Active Problems:   Abscess of chest [facility])   Pulmonary embolism, septic ([place])   Polysubstance abuse   IV drug abuse   Hepatitis C infection   Opioid abuse   Obesity   Depression   COPD (chronic obstructive pulmonary disease) ([place])   Anxiety   Cigarette smoker   . HYDROmorphone      . piperacillin-tazobactam (ZOSYN)  IV  3.375 g Intravenous Q8H  . vancomycin  750 mg Intravenous Q8H    Recommendations: 1. Continue vancomycin 2. Change piperacillin tazobactam to ceftriaxone 3. Await results of blood and abscess cultures 4. Await results of TTE 5. Hepatitis B serology and HIV antibody   Assessment: She has septic arthritis of her right sternoclavicular joint associated with abscess. This has been smoldering for about 2 months. She has some small pulmonary nodules that may be septic pulmonary emboli. She is certainly at risk for endocarditis. Last year she was admitted with forearm abscesses that grew Eikenella and strep. I will treat her with vancomycin and ceftriaxone pending final cultures.   HPI: [name] is a 45 y.o. female with history of injecting drug use who has had right upper chest and shoulder pain for the past 2 months. She was seen in the ED on 01/01/2016 and referred to Dr. Ples Specter. He arranged for a CT scan but she never followed through with PET scan. She presented yesterday with pain and swelling over the right sternoclavicular joint and CT revealed joint destruction and abscess as well as a few small pulmonary nodules. She underwent incision and drainage today. The initial Gram stain of  operative specimens did not show any organisms.   Review of Systems: Review of Systems  Constitutional: Positive for chills and malaise/fatigue. Negative for diaphoresis, fever and weight loss.  HENT: Negative for sore throat.   Respiratory: Negative for cough, sputum production and shortness of breath.        No change in chronic, productive smoker's cough.  Cardiovascular: Positive for chest pain.  Gastrointestinal: Negative for abdominal pain, diarrhea, heartburn, nausea and vomiting.  Genitourinary: Negative for dysuria and frequency.  Musculoskeletal: Positive for joint pain. Negative for myalgias.  Skin: Negative for itching and rash.  Neurological: Negative for dizziness and headaches.  Psychiatric/Behavioral: Positive for depression and substance abuse. The patient is nervous/anxious.     Past Medical History:  Diagnosis Date  . Anxiety   . Chronic back pain   . Chronic knee pain   . COPD (chronic obstructive pulmonary disease) ([place])   . DDD (degenerative disc disease)   . Hepatitis C infection 07/13/2014  . IV drug abuse   . Nephrolithiasis   . Panic attacks   . Polysubstance abuse    cocaine, opiates, marijuana, amphetamines, "molly," crystal meth  . Pulmonary embolism [facility])     Social History  Substance Use Topics  . Smoking status: Current Every Day Smoker    Packs/day: 0.50    Years: 25.00    Types: Cigarettes  . Smokeless tobacco: Never Used  .  Alcohol use No    Family History  Problem Relation Age of Onset  . COPD Mother   . Hypertension Father   . Heart attack Father   . Diabetes Father   . Arthritis    . Lung disease    . Cancer    . Asthma     Allergies  Allergen Reactions  . Doxycycline Other (See Comments)    burns  . Mango Flavor Hives and Swelling  . Pyridium [Phenazopyridine Hcl] Other (See Comments)    Irritation to skin  . Tramadol Nausea And Vomiting    OBJECTIVE: Blood pressure 102/77, pulse 78, temperature 98.8 F (37.1 C),  temperature source Oral, resp. rate 18, height 5\' 1"  (1.549 m), weight 167 lb 12.3 oz (76.1 kg), last menstrual period 01/21/2016, SpO2 94 %.  Physical Exam  Constitutional: She is oriented to person, place, and time.  She is groggy sitting up in bed.  HENT:  Mouth/Throat: No oropharyngeal exudate.  Eyes: Conjunctivae are normal.  Cardiovascular: Normal rate and regular rhythm.   No murmur heard. Pulmonary/Chest: Effort normal and breath sounds normal. She has no wheezes. She has no rales.  Abdominal: Soft. She exhibits no mass. There is no tenderness.  Musculoskeletal:  She has a bulky dressing over her right clavicle.  Neurological: She is alert and oriented to person, place, and time.  Skin: No rash noted.  She has a few tattoos. She has some scars and track marks on her arms.  Psychiatric: Mood and affect normal.    Lab Results Lab Results  Component Value Date   WBC 7.8 02/22/2016   HGB 9.0 (L) 02/22/2016   HCT 28.1 (L) 02/22/2016   MCV 78.7 02/22/2016   PLT 363 02/22/2016    Lab Results  Component Value Date   CREATININE 0.80 02/22/2016   BUN 7 02/22/2016   NA 138 02/22/2016   K 3.8 02/22/2016   CL 106 02/22/2016   CO2 26 02/22/2016    Lab Results  Component Value Date   ALT 12 (L) 02/21/2016   AST 12 (L) 02/21/2016   ALKPHOS 73 02/21/2016   BILITOT 0.7 02/21/2016     Microbiology: Recent Results (from the past 240 hour(s))  Blood culture (routine x 2)     Status: None (Preliminary result)   Collection Time: 02/21/16  2:16 PM  Result Value Ref Range Status   Specimen Description BLOOD  Final   Special Requests NONE  Final   Culture NO GROWTH < 24 HOURS  Final   Report Status PENDING  Incomplete  Blood culture (routine x 2)     Status: None (Preliminary result)   Collection Time: 02/21/16  2:34 PM  Result Value Ref Range Status   Specimen Description BLOOD  Final   Special Requests NONE  Final   Culture NO GROWTH < 24 HOURS  Final   Report Status  PENDING  Incomplete  Surgical pcr screen     Status: None   Collection Time: 02/22/16 10:34 AM  Result Value Ref Range Status   MRSA, PCR NEGATIVE NEGATIVE Final   Staphylococcus aureus NEGATIVE NEGATIVE Final    Comment:        The Xpert SA Assay (FDA approved for NASAL specimens in patients over 50 years of age), is one component of a comprehensive surveillance program.  Test performance has been validated by [facility] for patients greater than or equal to 59 year old. It is not intended to diagnose infection nor to  guide or monitor treatment.   Aerobic Culture (superficial specimen)     Status: None (Preliminary result)   Collection Time: 02/22/16 11:57 AM  Result Value Ref Range Status   Specimen Description ABSCESS  Final   Special Requests   Final    STERNOCLAVICULAR SPECIMEN A PATIENT ON FOLLOWING  VANCOMYCIN   Gram Stain PENDING  Incomplete   Culture PENDING  Incomplete   Report Status PENDING  Incomplete  Aerobic/Anaerobic Culture (surgical/deep wound)     Status: None (Preliminary result)   Collection Time: 02/22/16 12:00 PM  Result Value Ref Range Status   Specimen Description ABSCESS  Final   Special Requests   Final    STERNOCLAVICULAR SPECIMEN B PATIENT ON FOLLOWING  VANCOMYCIN   Gram Stain   Final    ABUNDANT WBC PRESENT,BOTH PMN AND MONONUCLEAR NO ORGANISMS SEEN Gram Stain Report Called to,Read Back By and Verified With: B GORDON,RN AT [id number] 02/22/16 BY L BENFIELD    Culture PENDING  Incomplete   Report Status PENDING  Incomplete    Michel Bickers, MD [place] for Infectious Disease [facility] [phone] pager   [phone] cell 02/22/2016, 3:18 PM

## 2016-02-23 NOTE — CV Procedure (Signed)
    TEE  Indications: Bacteremia  Findings: Small Vegetation seen in the right ventricle 1 cm distal to the septal leaflet of the tricuspid valve measuring 0.8 x 0.2 cm. There is no tricuspid valve deterioration.   See full report for more details.  Candee Furbish, MD,

## 2016-02-23 NOTE — Transfer of Care (Signed)
Immediate Anesthesia Transfer of Care Note  Patient: [name]  Procedure(s) Performed: Procedure(s): TRANSESOPHAGEAL ECHOCARDIOGRAM (TEE) (N/A)  Patient Location: PACU  Anesthesia Type:MAC  Level of Consciousness: awake, alert  and oriented  Airway & Oxygen Therapy: Patient Spontanous Breathing  Post-op Assessment: Report given to RN, Post -op Vital signs reviewed and stable and Patient moving all extremities X 4  Post vital signs: Reviewed and stable  Last Vitals:  Vitals:   02/23/16 1108 02/23/16 1110  BP: 140/83 140/83  Pulse: 62 64  Resp: 10 (!) 9  Temp: 36.4 C     Last Pain:  Vitals:   02/23/16 1108  TempSrc: Oral  PainSc:       Patients Stated Pain Goal: 2 ([id number])  Complications: No apparent anesthesia complications

## 2016-02-23 NOTE — Transfer of Care (Signed)
Immediate Anesthesia Transfer of Care Note  Patient: [name]  Procedure(s) Performed: Procedure(s): DRESSING CHANGE UNDER ANESTHESIA  WITH WOUND VAC PLACEMENT (N/A)  Patient Location: PACU  Anesthesia Type:General  Level of Consciousness: awake, alert  and oriented  Airway & Oxygen Therapy: Patient Spontanous Breathing and Patient connected to nasal cannula oxygen  Post-op Assessment: Report given to RN and Post -op Vital signs reviewed and stable  Post vital signs: Reviewed and stable  Last Vitals:  Vitals:   02/23/16 1125 02/23/16 1636  BP: 125/80   Pulse: 66   Resp: 14   Temp: 36.3 C (P) 36.6 C    Last Pain:  Vitals:   02/23/16 1636  TempSrc:   PainSc: (P) 3       Patients Stated Pain Goal: 2 ([id number])  Complications: No apparent anesthesia complications

## 2016-02-23 NOTE — Interval H&P Note (Signed)
History and Physical Interval Note:  02/23/2016 2:25 PM  [name]  has presented today for surgery, with the diagnosis of RIGHT [place] JOINT INFECTION  The various methods of treatment have been discussed with the patient and family. After consideration of risks, benefits and other options for treatment, the patient has consented to  Procedure(s): DRESSING CHANGE UNDER ANESTHESIA ,POSSIBLE VAC PLACEMENT (N/A) as a surgical intervention .  The patient's history has been reviewed, patient examined, no change in status, stable for surgery.  I have reviewed the patient's chart and labs.  Questions were answered to the patient's satisfaction.     Melrose Nakayama

## 2016-02-23 NOTE — Progress Notes (Signed)
Pharmacy Antibiotic Note  [name] is a 45 y.o. female admitted on 02/21/2016 with cellulitis. 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 It also shows septic emboli in the lungs and an abscess over the sternoclavicular joint. The patient underwent I&D of the sternoclavicular joint abscess on 11/12 by CVTS.  The patient was originally started on Vancomycin + Zosyn >> then transitioned to Vancomycin + Rocephin on 11/12 per ID >> and now to transition to Cefepime monotherapy given Pseudomonas in sternal wound and vegetation now noted on TEE 11/13.   The patient's renal function is noted to have worsened this morning (SCr 1.42 << 0.8), estimated CrCl~45-50 ml/min. Will monitor this closely but will still be aggressive with Cefepime dosing given Pseudomonas cultures.   Plan: 1. Discontinue Vancomycin + Rocephin 2. Start Cefepime 2g IV every 12 hours 3. Will continue to follow renal function, culture results, LOT, and antibiotic de-escalation plans   Height: 5\' 1"  (154.9 cm) Weight: 168 lb 3.4 oz (76.3 kg) IBW/kg (Calculated) : 47.8  Temp (24hrs), Avg:98.4 F (36.9 C), Min:98.1 F (36.7 C), Max:98.8 F (37.1 C)   Recent Labs Lab 02/21/16 1415 02/21/16 1417 02/22/16 0610 02/23/16 0445  WBC 10.9*  --  7.8  --   CREATININE 0.59  --  0.80 1.42*  LATICACIDVEN  --  0.7  --   --     Estimated Creatinine Clearance: 47.2 mL/min (by C-G formula based on SCr of 1.42 mg/dL (H)).    Allergies  Allergen Reactions  . Doxycycline Other (See Comments)    burns  . Mango Flavor Hives and Swelling  . Pyridium [Phenazopyridine Hcl] Other (See Comments)    Irritation to skin  . Tramadol Nausea And Vomiting    Antimicrobials this admission:  Zosyn 11/11 >> 11/12 Vanc 11/11 >> 11/13 CTX 11/12 >> 11/13 Cefepime 11/13 >>  Dose adjustments this admission:   Microbiology results:  11/11 BCx >> 1/2 GNR 11/12 Abscess cx (intra-op) >> Pseudomonas 11/12 MRSA PCR >>  neg  Thank you for allowing pharmacy to be a part of this patient's care.  Alycia Rossetti, PharmD, BCPS Clinical Pharmacist Pager: [phone] Clinical phone for 02/23/2016 from 7a-3:30p: [phone] If after 3:30p, please call main pharmacy at: [phone] 02/23/2016 8:55 AM

## 2016-02-23 NOTE — Anesthesia Postprocedure Evaluation (Signed)
Anesthesia Post Note  Patient: DEIDREE FRAIOLI  Procedure(s) Performed: Procedure(s) (LRB): TRANSESOPHAGEAL ECHOCARDIOGRAM (TEE) (N/A)  Patient location during evaluation: Endoscopy Anesthesia Type: MAC Level of consciousness: awake, awake and alert and oriented Pain management: pain level controlled Vital Signs Assessment: post-procedure vital signs reviewed and stable Respiratory status: spontaneous breathing, nonlabored ventilation, respiratory function stable and patient connected to nasal cannula oxygen Cardiovascular status: blood pressure returned to baseline Anesthetic complications: no    Last Vitals:  Vitals:   02/23/16 1110 02/23/16 1125  BP: 140/83 125/80  Pulse: 64 66  Resp: (!) 9 14  Temp:  36.3 C    Last Pain:  Vitals:   02/23/16 1125  TempSrc: Oral  PainSc:                  Dejanay Wamboldt COKER

## 2016-02-23 NOTE — Op Note (Signed)
[name], [name]                  ACCOUNT NO.:  [id number]  MEDICAL RECORD NO.:  [id number]  LOCATION:  6E23C                        FACILITY:  [facility]  PHYSICIAN:  Revonda Standard. Roxan Hockey, M.D.DATE OF BIRTH:  [birth year]  DATE OF PROCEDURE:  02/22/2016 DATE OF DISCHARGE:                              OPERATIVE REPORT   PREOPERATIVE DIAGNOSIS:  Right sternoclavicular and manubrial abscess.  POSTOPERATIVE DIAGNOSIS:  Right sternoclavicular and manubrial abscess.  PROCEDURE:  Incision and drainage of right sternoclavicular joint/ manubrial abscess.  SURGEON:  Revonda Standard. Roxan Hockey, M.D.  ASSISTANT:  John Giovanni, P.A.-C.  ANESTHESIA:  General.  FINDINGS:  Frank pus in the subcutaneous tissue.   Extensive inflammatory reaction and surrounding tissue.  Purulence extending into the sternoclavicular joint.  Posterior aspect of joint left intact.  CLINICAL NOTE:  [name] is a 45 year old woman with a history of IV drug abuse who presented with pain and swelling in the right sternoclavicular area.  She was transferred to [facility] and started on intravenous antibiotics.  On exam, she had a large tender firm mass at the junction of the manubrium and clavicle on the right side.  She was advised to undergo incision and drainage.  The indications, risks, benefits, and alternatives were discussed in detail with the patient. She did understand that this would result in an open wound which would need ongoing wound care.  She also understood there was potential for limitations of shoulder function and chronic pain. She accepted the risks and agreed to proceed.  OPERATIVE NOTE:  [name] was brought to the operating room on February 22, 2016.  She had induction of general anesthesia and was intubated. She was already receiving intravenous antibiotics.  Sequential compression devices were placed on the calves for DVT prophylaxis.  The chest was prepped and draped in usual sterile fashion.   After performing a time-out, an incision was made in the right supraclavicular fossa extending over the manubrium. Approximately 10 mL of frank pus was encountered in the subcutaneous tissue, this was sent for cultures.  The surrounding tissue was markedly inflamed and friable.  The area was probed with a finger to find in the areas where the infection tracked. There was a small amount superior and laterally.  The infection did track into the sternoclavicular joint medially and inferiorly.  Necrotic tissue in this area was debrided with a rongeur.  The posterior capsule was left intact.  The wound was copiously irrigated with warm saline and antibiotic solution.  It was packed with Kerlix gauze.  The patient was extubated in the operating room taken to the postanesthetic care unit in good condition.     Revonda Standard Roxan Hockey, M.D.     SCH/MEDQ  D:  02/22/2016  T:  02/23/2016  Job:  [id number]

## 2016-02-23 NOTE — Brief Op Note (Addendum)
02/21/2016 - 02/23/2016  4:09 PM  PATIENT:  [name]  45 y.o. female  PRE-OPERATIVE DIAGNOSIS:  RIGHT [place] JOINT INFECTION  POST-OPERATIVE DIAGNOSIS:  RIGHT [place] JOINT INFECTION  PROCEDURE:  Procedure(s): DRESSING CHANGE UNDER ANESTHESIA  WITH WOUND VAC PLACEMENT (N/A)  SURGEON:  Surgeon(s) and Role:    * [place] Nakayama, MD - Primary  PHYSICIAN ASSISTANT:   ASSISTANTS: none   ANESTHESIA:   general  EBL:  Total I/O In: 400 [I.V.:400] Out: 0   BLOOD ADMINISTERED:none  DRAINS: VAC placed   LOCAL MEDICATIONS USED:  NONE  SPECIMEN:  No Specimen  DISPOSITION OF SPECIMEN:  N/A  COUNTS:  YES  PLAN OF CARE: Admit to inpatient   PATIENT DISPOSITION:  PACU - hemodynamically stable.   Delay start of Pharmacological VTE agent (>24hrs) due to surgical blood loss or risk of bleeding: no  [id number]

## 2016-02-23 NOTE — Anesthesia Postprocedure Evaluation (Signed)
Anesthesia Post Note  Patient: LAVONDA IMDIEKE  Procedure(s) Performed: Procedure(s) (LRB): DRESSING CHANGE UNDER ANESTHESIA  WITH WOUND VAC PLACEMENT (N/A)  Patient location during evaluation: PACU Anesthesia Type: General Level of consciousness: awake and alert, oriented and patient cooperative Pain management: pain level controlled Vital Signs Assessment: post-procedure vital signs reviewed and stable Respiratory status: spontaneous breathing, nonlabored ventilation, respiratory function stable and patient connected to nasal cannula oxygen Cardiovascular status: blood pressure returned to baseline and stable Postop Assessment: no signs of nausea or vomiting Anesthetic complications: no    Last Vitals:  Vitals:   02/23/16 1639 02/23/16 1722  BP: (!) 144/77 140/81  Pulse: 65 (!) 59  Resp: 16 18  Temp:  36.8 C    Last Pain:  Vitals:   02/23/16 1722  TempSrc: Oral  PainSc:                  Dustina Scoggin,E. Koraline Phillipson

## 2016-02-24 ENCOUNTER — Inpatient Hospital Stay (HOSPITAL_COMMUNITY): Payer: Medicare Other

## 2016-02-24 ENCOUNTER — Encounter (HOSPITAL_COMMUNITY): Payer: Self-pay | Admitting: Thoracic Surgery (Cardiothoracic Vascular Surgery)

## 2016-02-24 DIAGNOSIS — Z978 Presence of other specified devices: Secondary | ICD-10-CM

## 2016-02-24 LAB — CBC
HCT: 24.3 % — ABNORMAL LOW (ref 36.0–46.0)
Hemoglobin: 7.9 g/dL — ABNORMAL LOW (ref 12.0–15.0)
MCH: 25.7 pg — ABNORMAL LOW (ref 26.0–34.0)
MCHC: 32.5 g/dL (ref 30.0–36.0)
MCV: 79.2 fL (ref 78.0–100.0)
Platelets: 316 10*3/uL (ref 150–400)
RBC: 3.07 MIL/uL — ABNORMAL LOW (ref 3.87–5.11)
RDW: 16.7 % — ABNORMAL HIGH (ref 11.5–15.5)
WBC: 9.5 10*3/uL (ref 4.0–10.5)

## 2016-02-24 LAB — BASIC METABOLIC PANEL
Anion gap: 6 (ref 5–15)
BUN: 10 mg/dL (ref 6–20)
CO2: 23 mmol/L (ref 22–32)
Calcium: 7.9 mg/dL — ABNORMAL LOW (ref 8.9–10.3)
Chloride: 110 mmol/L (ref 101–111)
Creatinine, Ser: 1.57 mg/dL — ABNORMAL HIGH (ref 0.44–1.00)
GFR calc Af Amer: 45 mL/min — ABNORMAL LOW (ref >60)
GFR calc non Af Amer: 39 mL/min — ABNORMAL LOW (ref >60)
Glucose, Bld: 117 mg/dL — ABNORMAL HIGH (ref 65–99)
Potassium: 3.7 mmol/L (ref 3.5–5.1)
Sodium: 139 mmol/L (ref 135–145)

## 2016-02-24 NOTE — Consult Note (Addendum)
WOC consult requested for Vac dressing change assistance on Wed for first post-op dresssing change to post-op full thickness wound to right upper chest.  Vac intact with good seal at 161mm cont suction. Discussed plan with Jadene Pierini, PA. Will plan to meet CT team for dressing change tomorrow at 0730. Julien Girt MSN, RN, [place], [place], [place]

## 2016-02-24 NOTE — Op Note (Signed)
[name], [name]                  ACCOUNT NO.:  [id number]  MEDICAL RECORD NO.:  [id number]  LOCATION:  6E23C                        FACILITY:  [facility]  PHYSICIAN:  Revonda Standard. Roxan Hockey, M.D.DATE OF BIRTH:  [birth year]  DATE OF PROCEDURE:  02/23/2016 DATE OF DISCHARGE:                              OPERATIVE REPORT   PREOPERATIVE DIAGNOSIS:  Infection of right sternoclavicular joint.  POSTOPERATIVE DIAGNOSIS:  Infection of right sternoclavicular joint.  PROCEDURE:  Dressing change under anesthesia and VAC placement, right sternoclavicular joint wound.  SURGEONS:  Revonda Standard. Roxan Hockey, M.D.  ASSISTANT:  None.  ANESTHESIA:  General.  FINDINGS:  Wound clean.   No apparent un-drained collections.  VAC placed.  CLINICAL NOTE:  [name] is a 45 year old woman with a history of intravenous drug abuse who presented with an abscess of the right sternoclavicular joint.  She had undergone an incision and drainage of that on February 22, 2016. the plan now was to return to the OR for a dressing change under anesthesia and possible VAC placement if the wound is felt suitable.  The indications, risks, benefits, and alternatives were discussed in detail with the patient.  She understood and accepted the risks and agreed to proceed.  OPERATIVE NOTE:  [name] was brought to the operating room on February 23, 2016.  She had induction of general anesthesia and was intubated. The dressing and packing were removed.  The chest was prepped and draped in usual sterile fashion.  The wound was irrigated with saline. There was no significant necrotic material and no un-drained pus was evident. The decision was made to place a VAC. The sponge was cut into several pieces to fit into the various cavities. The dressing was applied and suction was applied at 125 mmHg.  The patient was extubated in the operating room taken to the postanesthetic care unit in good condition.     Revonda Standard  Roxan Hockey, M.D.     SCH/MEDQ  D:  02/23/2016  T:  02/24/2016  Job:  [id number]

## 2016-02-24 NOTE — Progress Notes (Signed)
[address]       [place] [address]             [phone]      1 Day Post-Op Procedure(s) (LRB): DRESSING CHANGE UNDER ANESTHESIA  WITH WOUND VAC PLACEMENT (N/A) Subjective: Feels pretty well   Objective: Vital signs in last 24 hours: Temp:  [97.4 F (36.3 C)-98.6 F (37 C)] 98.6 F (37 C) (11/14 0520) Pulse Rate:  [59-70] 65 (11/14 0520) Cardiac Rhythm: Normal sinus rhythm (11/13 1636) Resp:  [9-19] 18 (11/14 0520) BP: (124-144)/(66-93) 136/66 (11/14 0520) SpO2:  [97 %-99 %] 99 % (11/14 0520) Weight:  [176 lb 9.4 oz (80.1 kg)] 176 lb 9.4 oz (80.1 kg) (11/13 2135)  Hemodynamic parameters for last 24 hours:    Intake/Output from previous day: 11/13 0701 - 11/14 0700 In: 2635 [P.O.:840; I.V.:1695; IV Piggyback:100] Out: [id number] [Urine:1200; Blood:10] Intake/Output this shift: No intake/output data recorded.   PE  NAD Lungs dim in left base Cor RRR no murmur Abd- benign Ext no edema VAC in place    Lab Results:  Recent Labs  02/22/16 0610 02/24/16 0447  WBC 7.8 9.5  HGB 9.0* 7.9*  HCT 28.1* 24.3*  PLT 363 316   BMET:  Recent Labs  02/23/16 0445 02/24/16 0447  NA 137 139  K 3.9 3.7  CL 106 110  CO2 24 23  GLUCOSE 146* 117*  BUN 12 10  CREATININE 1.42* 1.57*  CALCIUM 8.1* 7.9*    PT/INR:  Recent Labs  02/22/16 1037  LABPROT 13.6  INR 1.04   ABG No results found for: PHART, HCO3, TCO2, ACIDBASEDEF, O2SAT CBG (last 3)  No results for input(s): GLUCAP in the last 72 hours.   Results for orders placed or performed during the hospital encounter of 02/21/16  Blood culture (routine x 2)     Status: None (Preliminary result)   Collection Time: 02/21/16  2:16 PM  Result Value Ref Range Status   Specimen Description LEFT ANTECUBITAL  Final   Special Requests BOTTLES DRAWN AEROBIC AND ANAEROBIC [facility] EACH  Final   Culture  Setup Time   Final    GRAM NEGATIVE RODS RECOVERED FROM THE AEROBIC BOTTLE Gram Stain Report  Called to,Read Back By and Verified With: YOUNG, J AT 0750 BY HUFFINES,S ON 02/23/16 RIOS,A. [facility]) AT 0935 ON 02/23/2016 BY BAUGHAM,M. Performed at [place], READ BACK BY AND VERIFIED WITH: Karlene Einstein, PHARMD AT [place] ON 02/23/16 BY C. JESSUP, MLT. Performed at [place]  Final   Report Status PENDING  Incomplete  Blood Culture ID Panel (Reflexed)     Status: None   Collection Time: 02/21/16  2:16 PM  Result Value Ref Range Status   Enterococcus species NOT DETECTED NOT DETECTED Final   Listeria monocytogenes NOT DETECTED NOT DETECTED Final   Staphylococcus species NOT DETECTED NOT DETECTED Final   Staphylococcus aureus NOT DETECTED NOT DETECTED Final   Streptococcus species NOT DETECTED NOT DETECTED Final   Streptococcus agalactiae NOT DETECTED NOT DETECTED Final   Streptococcus pneumoniae NOT DETECTED NOT DETECTED Final   Streptococcus pyogenes NOT DETECTED NOT DETECTED Final   Acinetobacter baumannii NOT DETECTED NOT DETECTED Final   Enterobacteriaceae species NOT DETECTED NOT DETECTED Final   Enterobacter cloacae complex NOT DETECTED NOT DETECTED Final   Escherichia coli NOT DETECTED NOT DETECTED Final   Klebsiella oxytoca NOT DETECTED NOT DETECTED  Final   Klebsiella pneumoniae NOT DETECTED NOT DETECTED Final   Proteus species NOT DETECTED NOT DETECTED Final   Serratia marcescens NOT DETECTED NOT DETECTED Final   Haemophilus influenzae NOT DETECTED NOT DETECTED Final   Neisseria meningitidis NOT DETECTED NOT DETECTED Final   Pseudomonas aeruginosa NOT DETECTED NOT DETECTED Final   Candida albicans NOT DETECTED NOT DETECTED Final   Candida glabrata NOT DETECTED NOT DETECTED Final   Candida krusei NOT DETECTED NOT DETECTED Final   Candida parapsilosis NOT DETECTED NOT DETECTED Final   Candida tropicalis NOT DETECTED NOT DETECTED Final    Comment: Performed at [facility]  Blood culture (routine  x 2)     Status: None (Preliminary result)   Collection Time: 02/21/16  2:34 PM  Result Value Ref Range Status   Specimen Description BLOOD LEFT HAND  Final   Special Requests BOTTLES DRAWN AEROBIC AND ANAEROBIC 8CC EACH  Final   Culture NO GROWTH 2 DAYS  Final   Report Status PENDING  Incomplete  Surgical pcr screen     Status: None   Collection Time: 02/22/16 10:34 AM  Result Value Ref Range Status   MRSA, PCR NEGATIVE NEGATIVE Final   Staphylococcus aureus NEGATIVE NEGATIVE Final    Comment:        The Xpert SA Assay (FDA approved for NASAL specimens in patients over 90 years of age), is one component of a comprehensive surveillance program.  Test performance has been validated by [facility] for patients greater than or equal to 20 year old. It is not intended to diagnose infection nor to guide or monitor treatment.   Aerobic Culture (superficial specimen)     Status: None (Preliminary result)   Collection Time: 02/22/16 11:57 AM  Result Value Ref Range Status   Specimen Description ABSCESS  Final   Special Requests   Final    STERNOCLAVICULAR SPECIMEN A PATIENT ON FOLLOWING  VANCOMYCIN   Gram Stain   Final    ABUNDANT WBC PRESENT, PREDOMINANTLY PMN NO ORGANISMS SEEN    Culture CULTURE REINCUBATED FOR BETTER GROWTH  Final   Report Status PENDING  Incomplete  Aerobic/Anaerobic Culture (surgical/deep wound)     Status: None (Preliminary result)   Collection Time: 02/22/16 12:00 PM  Result Value Ref Range Status   Specimen Description ABSCESS  Final   Special Requests   Final    STERNOCLAVICULAR SPECIMEN B PATIENT ON FOLLOWING  VANCOMYCIN   Gram Stain   Final    ABUNDANT WBC PRESENT,BOTH PMN AND MONONUCLEAR NO ORGANISMS SEEN Gram Stain Report Called to,Read Back By and Verified With: B GORDON,RN AT [id number] 02/22/16 BY L BENFIELD    Culture FEW PSEUDOMONAS AERUGINOSA  Final   Report Status PENDING  Incomplete    Meds Scheduled Meds: . piperacillin-tazobactam  (ZOSYN)  IV  3.375 g Intravenous Q8H   Continuous Infusions: . sodium chloride 100 mL/hr at 02/24/16 0212   PRN Meds:.acetaminophen **OR** acetaminophen, cloNIDine, loperamide, morphine injection, ondansetron **OR** ondansetron (ZOFRAN) IV, polyethylene glycol, sodium chloride flush  Xrays No results found.  Assessment/Plan: S/P Procedure(s) (LRB): DRESSING CHANGE UNDER ANESTHESIA  WITH WOUND VAC PLACEMENT (N/A)  1 stable- no fevers or leukocytosis 2 ABX pre ID- NGSF from OP Cultures 3 anxious to restart suboxone when discharged- goes to Clinic in [facility]  4 small vent veg on TEE- no TV involvement   LOS: 3 days    Kathe Wirick E 02/24/2016

## 2016-02-24 NOTE — Progress Notes (Signed)
PROGRESS NOTE  [name]  [id number] DOB: 29-Aug-1970 DOA: 02/21/2016 PCP: [name] Patient Outpatient Specialists:  Subjective: No complaints overnight other than pain on the right sternoclavicular joint. Denies any fever, chills.  Brief Narrative:  [name] is a 45 y.o. female with a history of IV drug use (primarily heroin), hepatitis C. Patient has swollen area above her sternoclavicular joint with pain that radiates into her shoulder. Patient had been seen by Dr. Aline Brochure for complaints of some swelling and prominence over the right sternoclavicular joint on 01/26/16. Dr. Aline Brochure ordered a CT, however the patient never followed up for that. She continues to have pain over the past several weeks, but suddenly had redness and extreme swelling over the past 24 hours. Ibuprofen helps mildly with pain. Patient has been using heroin IV - does about 30 g a day. Last drug use was 10 AM. Denies fevers or chills.  Assessment & Plan:   Principal Problem:   Septic arthritis of right sternoclavicular joint (HCC) Active Problems:   Obesity   Polysubstance abuse   Depression   IV drug abuse   Hepatitis C infection   COPD (chronic obstructive pulmonary disease) (HCC)   Abscess of chest (HCC)   Opioid abuse   Pulmonary embolism, septic (HCC)   Anxiety   Cigarette smoker   Endocarditis of tricuspid valve   Septic arthritis of the right sternoclavicular joint -History of IVDU, presents with right sternoclavicular joint swelling, pain and low-grade fever past several weeks. -CT scan showed septic arthritis, seen by CTS, Dr. Roxan Hockey. -Taken to the OR on 02/22/2016, unfortunately received Zosyn and vancomycin on 11/11 prior to obtaining cultures. -Has left upper extremity PICC line placed on 02/21/2016 at [facility]. -I appreciate ID recommendations, currently on cefepime, culture is growing Pseudomonas.  Bacteremia, GNR  -1/2 culture shows gram-negative bacteria. Without  identification from [place]. -Repeat blood cultures today.  Tricuspid valve endocarditis -TEE done on 11/13 and showed small vegetation involving the septal leaflet of the tricuspid valve. -Vegetation measures 0.80.2 cm without evidence of tricuspid valve deterioration.  Septic pulmonary embolism -2 nodules one measures 1.7 cm and the other one 0.6 cm suspicious for septic emboli in the RML. 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 -Continue current antibiotics.  Acute kidney injury -Creatinine on admission 0.8, increased to 1.4 today. -Cannot rule out vancomycin as offensive organism, discontinue Toradol, increase IV fluids to 100 mL/hour -Check BMP in a.m. obtain ultrasound.  IV drug use -Drug of choice is heroin, this is probably a golden opportunity for detoxification. -Started on Bentyl, Imodium and clonidine as needed. -IV morphine as needed to control withdrawal symptoms and for postoperative pain. -The goal overall is to taper off the opioids to be discontinued prior to discharge. -I think we will face the dilemma of sending heroin user to home with IV antibiotics and central line.  Hepatitis C -Appears to be has chronic hepatitis, no transaminitis or coagulopathy -HIV nonreactive.   DVT prophylaxis: Subcutaneous heparin Code Status: Full Code Family Communication:  Disposition Plan:  Diet: Diet regular Room service appropriate? Yes; Fluid consistency: Thin  Consultants:   None  Procedures:   None  Antimicrobials:   None  Objective: Vitals:   02/23/16 1722 02/23/16 2135 02/24/16 0520 02/24/16 0906  BP: 140/81 124/71 136/66 (!) 150/87  Pulse: (!) 59 70 65 (!) 54  Resp: 18 19 18 18   Temp: 98.2 F (36.8 C) 98.2 F (36.8  C) 98.6 F (37 C) 97.6 F (36.4 C)  TempSrc: Oral Oral Oral Oral  SpO2: 99% 97% 99% 99%  Weight:  80.1 kg (176 lb 9.4 oz)    Height:        Intake/Output Summary (Last 24  hours) at 02/24/16 1053 Last data filed at 02/24/16 0908  Gross per 24 hour  Intake             2755 ml  Output             1210 ml  Net             1545 ml   Filed Weights   02/21/16 1949 02/22/16 2040 02/23/16 2135  Weight: 76.1 kg (167 lb 12.3 oz) 76.3 kg (168 lb 3.4 oz) 80.1 kg (176 lb 9.4 oz)    Examination: General exam: Appears calm and comfortable  Respiratory system: Clear to auscultation. Respiratory effort normal. Cardiovascular system: S1 & S2 heard, RRR. No JVD, murmurs, rubs, gallops or clicks. No pedal edema. Gastrointestinal system: Abdomen is nondistended, soft and nontender. No organomegaly or masses felt. Normal bowel sounds heard. Central nervous system: Alert and oriented. No focal neurological deficits. Extremities: Symmetric 5 x 5 power. Skin: No rashes, lesions or ulcers Psychiatry: Judgement and insight appear normal. Mood & affect appropriate.   Data Reviewed: I have personally reviewed following labs and imaging studies  CBC:  Recent Labs Lab 02/21/16 1415 02/22/16 0610 02/24/16 0447  WBC 10.9* 7.8 9.5  NEUTROABS 9.4*  --   --   HGB 10.1* 9.0* 7.9*  HCT 31.0* 28.1* 24.3*  MCV 79.7 78.7 79.2  PLT 433* 363 [id number]   Basic Metabolic Panel:  Recent Labs Lab 02/21/16 1415 02/22/16 0610 02/23/16 0445 02/24/16 0447  NA 136 138 137 139  K 3.8 3.8 3.9 3.7  CL 103 106 106 110  CO2 26 26 24 23   GLUCOSE 88 115* 146* 117*  BUN 7 7 12 10   CREATININE 0.59 0.80 1.42* 1.57*  CALCIUM 8.7* 8.6* 8.1* 7.9*   GFR: Estimated Creatinine Clearance: 43.8 mL/min (by C-G formula based on SCr of 1.57 mg/dL (H)). Liver Function Tests:  Recent Labs Lab 02/21/16 1415  AST 12*  ALT 12*  ALKPHOS 73  BILITOT 0.7  PROT 7.5  ALBUMIN 2.7*   No results for input(s): LIPASE, AMYLASE in the last 168 hours. No results for input(s): AMMONIA in the last 168 hours. Coagulation Profile:  Recent Labs Lab 02/22/16 1037  INR 1.04   Cardiac Enzymes: No results for  input(s): CKTOTAL, CKMB, CKMBINDEX, TROPONINI in the last 168 hours. BNP (last 3 results) No results for input(s): PROBNP in the last 8760 hours. HbA1C: No results for input(s): HGBA1C in the last 72 hours. CBG: No results for input(s): GLUCAP in the last 168 hours. Lipid Profile: No results for input(s): CHOL, HDL, LDLCALC, TRIG, CHOLHDL, LDLDIRECT in the last 72 hours. Thyroid Function Tests: No results for input(s): TSH, T4TOTAL, FREET4, T3FREE, THYROIDAB in the last 72 hours. Anemia Panel: No results for input(s): VITAMINB12, FOLATE, FERRITIN, TIBC, IRON, RETICCTPCT in the last 72 hours. Urine analysis:    Component Value Date/Time   COLORURINE YELLOW 02/21/2016 1600   APPEARANCEUR CLEAR 02/21/2016 1600   LABSPEC <1.005 (L) 02/21/2016 1600   PHURINE 7.5 02/21/2016 1600   GLUCOSEU NEGATIVE 02/21/2016 1600   HGBUR NEGATIVE 02/21/2016 1600   BILIRUBINUR NEGATIVE 02/21/2016 1600   KETONESUR NEGATIVE 02/21/2016 1600   PROTEINUR NEGATIVE 02/21/2016 1600   UROBILINOGEN  0.2 10/18/2014 0100   NITRITE NEGATIVE 02/21/2016 1600   LEUKOCYTESUR NEGATIVE 02/21/2016 1600   Sepsis Labs: @LABRCNTIP (procalcitonin:4,lacticidven:4)  ) Recent Results (from the past 240 hour(s))  Blood culture (routine x 2)     Status: None (Preliminary result)   Collection Time: 02/21/16  2:16 PM  Result Value Ref Range Status   Specimen Description LEFT ANTECUBITAL  Final   Special Requests BOTTLES DRAWN AEROBIC AND ANAEROBIC [facility] EACH  Final   Culture  Setup Time   Final    GRAM NEGATIVE RODS RECOVERED FROM THE AEROBIC BOTTLE Gram Stain Report Called to,Read Back By and Verified With: YOUNG, J AT 0750 BY HUFFINES,S ON 02/23/16 RIOS,A. [facility]) AT 0935 ON 02/23/2016 BY BAUGHAM,M. Performed at [place], READ BACK BY AND VERIFIED WITH: Karlene Einstein, PHARMD AT [place] ON 02/23/16 BY C. JESSUP, MLT. Performed at [place]  Final   Report  Status PENDING  Incomplete  Blood Culture ID Panel (Reflexed)     Status: None   Collection Time: 02/21/16  2:16 PM  Result Value Ref Range Status   Enterococcus species NOT DETECTED NOT DETECTED Final   Listeria monocytogenes NOT DETECTED NOT DETECTED Final   Staphylococcus species NOT DETECTED NOT DETECTED Final   Staphylococcus aureus NOT DETECTED NOT DETECTED Final   Streptococcus species NOT DETECTED NOT DETECTED Final   Streptococcus agalactiae NOT DETECTED NOT DETECTED Final   Streptococcus pneumoniae NOT DETECTED NOT DETECTED Final   Streptococcus pyogenes NOT DETECTED NOT DETECTED Final   Acinetobacter baumannii NOT DETECTED NOT DETECTED Final   Enterobacteriaceae species NOT DETECTED NOT DETECTED Final   Enterobacter cloacae complex NOT DETECTED NOT DETECTED Final   Escherichia coli NOT DETECTED NOT DETECTED Final   Klebsiella oxytoca NOT DETECTED NOT DETECTED Final   Klebsiella pneumoniae NOT DETECTED NOT DETECTED Final   Proteus species NOT DETECTED NOT DETECTED Final   Serratia marcescens NOT DETECTED NOT DETECTED Final   Haemophilus influenzae NOT DETECTED NOT DETECTED Final   Neisseria meningitidis NOT DETECTED NOT DETECTED Final   Pseudomonas aeruginosa NOT DETECTED NOT DETECTED Final   Candida albicans NOT DETECTED NOT DETECTED Final   Candida glabrata NOT DETECTED NOT DETECTED Final   Candida krusei NOT DETECTED NOT DETECTED Final   Candida parapsilosis NOT DETECTED NOT DETECTED Final   Candida tropicalis NOT DETECTED NOT DETECTED Final    Comment: Performed at [facility]  Blood culture (routine x 2)     Status: None (Preliminary result)   Collection Time: 02/21/16  2:34 PM  Result Value Ref Range Status   Specimen Description BLOOD LEFT HAND  Final   Special Requests BOTTLES DRAWN AEROBIC AND ANAEROBIC 8CC EACH  Final   Culture NO GROWTH 3 DAYS  Final   Report Status PENDING  Incomplete  Surgical pcr screen     Status: None   Collection Time: 02/22/16  10:34 AM  Result Value Ref Range Status   MRSA, PCR NEGATIVE NEGATIVE Final   Staphylococcus aureus NEGATIVE NEGATIVE Final    Comment:        The Xpert SA Assay (FDA approved for NASAL specimens in patients over 53 years of age), is one component of a comprehensive surveillance program.  Test performance has been validated by [facility] for patients greater than or equal to 6 year old. It is not intended to diagnose infection nor to guide or monitor treatment.   Aerobic Culture (superficial  specimen)     Status: None (Preliminary result)   Collection Time: 02/22/16 11:57 AM  Result Value Ref Range Status   Specimen Description ABSCESS  Final   Special Requests   Final    STERNOCLAVICULAR SPECIMEN A PATIENT ON FOLLOWING  VANCOMYCIN   Gram Stain   Final    ABUNDANT WBC PRESENT, PREDOMINANTLY PMN NO ORGANISMS SEEN    Culture   Final    FEW PSEUDOMONAS AERUGINOSA SUSCEPTIBILITIES TO FOLLOW    Report Status PENDING  Incomplete  Aerobic/Anaerobic Culture (surgical/deep wound)     Status: None (Preliminary result)   Collection Time: 02/22/16 12:00 PM  Result Value Ref Range Status   Specimen Description ABSCESS  Final   Special Requests   Final    STERNOCLAVICULAR SPECIMEN B PATIENT ON FOLLOWING  VANCOMYCIN   Gram Stain   Final    ABUNDANT WBC PRESENT,BOTH PMN AND MONONUCLEAR NO ORGANISMS SEEN Gram Stain Report Called to,Read Back By and Verified With: B GORDON,RN AT [id number] 02/22/16 BY L BENFIELD    Culture FEW PSEUDOMONAS AERUGINOSA  Final   Report Status PENDING  Incomplete     Invalid input(s): PROCALCITONIN, LACTICACIDVEN   Radiology Studies: No results found.      Scheduled Meds: . piperacillin-tazobactam (ZOSYN)  IV  3.375 g Intravenous Q8H   Continuous Infusions: . sodium chloride 100 mL/hr at 02/24/16 0212     LOS: 3 days    Time spent: 35 minutes    Tydarius Yawn A, MD [facility] Pager [phone]  If 7PM-7AM, please contact  night-coverage [email or web address] Password [email or web address] 02/24/2016, 10:53 AM

## 2016-02-24 NOTE — Progress Notes (Addendum)
[place] for Infectious Disease   Reason for visit: Follow up on endocarditis and right sternoclavicular joint infection  Interval History: VAC placed; growing Pseudomonas in wound culture and blood culture with GNR but nothing identified on the BCID and micro thinks possible anaerobe.  Continue on zosyn.   OP report independently reviewed   Physical Exam: Constitutional:  Vitals:   02/24/16 0520 02/24/16 0906  BP: 136/66 (!) 150/87  Pulse: 65 (!) 54  Resp: 18 18  Temp: 98.6 F (37 C) 97.6 F (36.4 C)   patient appears in NAD Eyes: anicteric HENT: no thrush Respiratory: Normal respiratory effort; CTA B Cardiovascular: RRR; VAC in place GI: soft, nt, nd  Review of Systems: Constitutional: negative for fevers and chills Cardiovascular: negative for chest pain Gastrointestinal: negative for diarrhea  Lab Results  Component Value Date   WBC 9.5 02/24/2016   HGB 7.9 (L) 02/24/2016   HCT 24.3 (L) 02/24/2016   MCV 79.2 02/24/2016   PLT 316 02/24/2016    Lab Results  Component Value Date   CREATININE 1.57 (H) 02/24/2016   BUN 10 02/24/2016   NA 139 02/24/2016   K 3.7 02/24/2016   CL 110 02/24/2016   CO2 23 02/24/2016    Lab Results  Component Value Date   ALT 12 (L) 02/21/2016   AST 12 (L) 02/21/2016   ALKPHOS 73 02/21/2016     Microbiology: Recent Results (from the past 240 hour(s))  Blood culture (routine x 2)     Status: None (Preliminary result)   Collection Time: 02/21/16  2:16 PM  Result Value Ref Range Status   Specimen Description LEFT ANTECUBITAL  Final   Special Requests BOTTLES DRAWN AEROBIC AND ANAEROBIC [facility] EACH  Final   Culture  Setup Time   Final    GRAM NEGATIVE RODS RECOVERED FROM THE AEROBIC BOTTLE Gram Stain Report Called to,Read Back By and Verified With: YOUNG, J AT 0750 BY HUFFINES,S ON 02/23/16 RIOS,A. [facility]) AT 0935 ON 02/23/2016 BY BAUGHAM,M. Performed at [place] BY AND  VERIFIED WITH: Karlene Einstein, PHARMD AT [id number] ON 02/23/16 BY C. JESSUP, MLT. Performed at [place]  Final   Report Status PENDING  Incomplete  Blood Culture ID Panel (Reflexed)     Status: None   Collection Time: 02/21/16  2:16 PM  Result Value Ref Range Status   Enterococcus species NOT DETECTED NOT DETECTED Final   Listeria monocytogenes NOT DETECTED NOT DETECTED Final   Staphylococcus species NOT DETECTED NOT DETECTED Final   Staphylococcus aureus NOT DETECTED NOT DETECTED Final   Streptococcus species NOT DETECTED NOT DETECTED Final   Streptococcus agalactiae NOT DETECTED NOT DETECTED Final   Streptococcus pneumoniae NOT DETECTED NOT DETECTED Final   Streptococcus pyogenes NOT DETECTED NOT DETECTED Final   Acinetobacter baumannii NOT DETECTED NOT DETECTED Final   Enterobacteriaceae species NOT DETECTED NOT DETECTED Final   Enterobacter cloacae complex NOT DETECTED NOT DETECTED Final   Escherichia coli NOT DETECTED NOT DETECTED Final   Klebsiella oxytoca NOT DETECTED NOT DETECTED Final   Klebsiella pneumoniae NOT DETECTED NOT DETECTED Final   Proteus species NOT DETECTED NOT DETECTED Final   Serratia marcescens NOT DETECTED NOT DETECTED Final   Haemophilus influenzae NOT DETECTED NOT DETECTED Final   Neisseria meningitidis NOT DETECTED NOT DETECTED Final   Pseudomonas aeruginosa NOT DETECTED NOT DETECTED Final   Candida albicans NOT DETECTED NOT DETECTED  Final   Candida glabrata NOT DETECTED NOT DETECTED Final   Candida krusei NOT DETECTED NOT DETECTED Final   Candida parapsilosis NOT DETECTED NOT DETECTED Final   Candida tropicalis NOT DETECTED NOT DETECTED Final    Comment: Performed at [facility]  Blood culture (routine x 2)     Status: None (Preliminary result)   Collection Time: 02/21/16  2:34 PM  Result Value Ref Range Status   Specimen Description BLOOD LEFT HAND  Final   Special Requests BOTTLES DRAWN AEROBIC AND  ANAEROBIC 8CC EACH  Final   Culture NO GROWTH 3 DAYS  Final   Report Status PENDING  Incomplete  Surgical pcr screen     Status: None   Collection Time: 02/22/16 10:34 AM  Result Value Ref Range Status   MRSA, PCR NEGATIVE NEGATIVE Final   Staphylococcus aureus NEGATIVE NEGATIVE Final    Comment:        The Xpert SA Assay (FDA approved for NASAL specimens in patients over 75 years of age), is one component of a comprehensive surveillance program.  Test performance has been validated by [facility] for patients greater than or equal to 60 year old. It is not intended to diagnose infection nor to guide or monitor treatment.   Aerobic Culture (superficial specimen)     Status: None (Preliminary result)   Collection Time: 02/22/16 11:57 AM  Result Value Ref Range Status   Specimen Description ABSCESS  Final   Special Requests   Final    STERNOCLAVICULAR SPECIMEN A PATIENT ON FOLLOWING  VANCOMYCIN   Gram Stain   Final    ABUNDANT WBC PRESENT, PREDOMINANTLY PMN NO ORGANISMS SEEN    Culture   Final    FEW PSEUDOMONAS AERUGINOSA SUSCEPTIBILITIES TO FOLLOW    Report Status PENDING  Incomplete  Aerobic/Anaerobic Culture (surgical/deep wound)     Status: None (Preliminary result)   Collection Time: 02/22/16 12:00 PM  Result Value Ref Range Status   Specimen Description ABSCESS  Final   Special Requests   Final    STERNOCLAVICULAR SPECIMEN B PATIENT ON FOLLOWING  VANCOMYCIN   Gram Stain   Final    ABUNDANT WBC PRESENT,BOTH PMN AND MONONUCLEAR NO ORGANISMS SEEN Gram Stain Report Called to,Read Back By and Verified With: B GORDON,RN AT [id number] 02/22/16 BY L BENFIELD    Culture FEW PSEUDOMONAS AERUGINOSA  Final   Report Status PENDING  Incomplete    Impression/Plan:  1. Septic arthritis - on zosyn for Pseudomonas, sensitivities pending.  Will need prolonged course of antibiotics. 2. Right ventricular vegetation - blood cultures not yet identified.  As above, will need 6 weeks of  antibiotics Repeat blood cultures today 3.  Substance abuse - will need continued efforts at remaining drug free.

## 2016-02-25 DIAGNOSIS — I2690 Septic pulmonary embolism without acute cor pulmonale: Secondary | ICD-10-CM

## 2016-02-25 DIAGNOSIS — I368 Other nonrheumatic tricuspid valve disorders: Secondary | ICD-10-CM

## 2016-02-25 DIAGNOSIS — R7881 Bacteremia: Secondary | ICD-10-CM

## 2016-02-25 LAB — BASIC METABOLIC PANEL
Anion gap: 6 (ref 5–15)
BUN: 9 mg/dL (ref 6–20)
CO2: 24 mmol/L (ref 22–32)
Calcium: 8.4 mg/dL — ABNORMAL LOW (ref 8.9–10.3)
Chloride: 108 mmol/L (ref 101–111)
Creatinine, Ser: 1.53 mg/dL — ABNORMAL HIGH (ref 0.44–1.00)
GFR calc Af Amer: 47 mL/min — ABNORMAL LOW (ref >60)
GFR calc non Af Amer: 40 mL/min — ABNORMAL LOW (ref >60)
Glucose, Bld: 116 mg/dL — ABNORMAL HIGH (ref 65–99)
Potassium: 3.7 mmol/L (ref 3.5–5.1)
Sodium: 138 mmol/L (ref 135–145)

## 2016-02-25 LAB — CULTURE, BLOOD (ROUTINE X 2)

## 2016-02-25 LAB — CBC
HCT: 24.6 % — ABNORMAL LOW (ref 36.0–46.0)
Hemoglobin: 7.8 g/dL — ABNORMAL LOW (ref 12.0–15.0)
MCH: 25.2 pg — ABNORMAL LOW (ref 26.0–34.0)
MCHC: 31.7 g/dL (ref 30.0–36.0)
MCV: 79.4 fL (ref 78.0–100.0)
Platelets: 336 10*3/uL (ref 150–400)
RBC: 3.1 MIL/uL — ABNORMAL LOW (ref 3.87–5.11)
RDW: 16.5 % — ABNORMAL HIGH (ref 11.5–15.5)
WBC: 8.9 10*3/uL (ref 4.0–10.5)

## 2016-02-25 LAB — AEROBIC CULTURE  (SUPERFICIAL SPECIMEN)

## 2016-02-25 LAB — AEROBIC CULTURE W GRAM STAIN (SUPERFICIAL SPECIMEN)

## 2016-02-25 MED ORDER — DEXTROSE 5 % IV SOLN
2.0000 g | Freq: Two times a day (BID) | INTRAVENOUS | Status: DC
  Administered 2016-02-25 – 2016-02-28 (×7): 2 g via INTRAVENOUS
  Filled 2016-02-25 (×8): qty 2

## 2016-02-25 MED ORDER — OXYCODONE-ACETAMINOPHEN 5-325 MG PO TABS
1.0000 | ORAL_TABLET | ORAL | Status: DC | PRN
  Administered 2016-02-26 – 2016-02-28 (×9): 2 via ORAL
  Filled 2016-02-25 (×9): qty 2

## 2016-02-25 MED ORDER — MORPHINE SULFATE (PF) 2 MG/ML IV SOLN
1.0000 mg | INTRAVENOUS | Status: DC | PRN
  Administered 2016-02-25 – 2016-02-28 (×12): 1 mg via INTRAVENOUS
  Filled 2016-02-25 (×11): qty 1

## 2016-02-25 NOTE — Progress Notes (Signed)
[place] for Infectious Disease   Reason for visit: Follow up on endocarditis and right sternoclavicular joint infection  Interval History: Pseudomonas sensitive in wound; blood culture grew Comamonas acidovorans which is similar to Pseudomonas.  Sensitivities noted on both.    Physical Exam: Constitutional:  Vitals:   02/25/16 0454 02/25/16 1028  BP: (!) 158/78 (!) 158/80  Pulse: (!) 50 (!) 45  Resp: 18 18  Temp: 98.7 F (37.1 C) 98.2 F (36.8 C)   patient appears in NAD Eyes: anicteric HENT: no thrush Respiratory: Normal respiratory effort; CTA B Cardiovascular: RRR; VAC in place GI: soft, nt, nd  Review of Systems: Constitutional: negative for fevers and chills Cardiovascular: negative for chest pain Gastrointestinal: negative for diarrhea  Lab Results  Component Value Date   WBC 8.9 02/25/2016   HGB 7.8 (L) 02/25/2016   HCT 24.6 (L) 02/25/2016   MCV 79.4 02/25/2016   PLT 336 02/25/2016    Lab Results  Component Value Date   CREATININE 1.53 (H) 02/25/2016   BUN 9 02/25/2016   NA 138 02/25/2016   K 3.7 02/25/2016   CL 108 02/25/2016   CO2 24 02/25/2016    Lab Results  Component Value Date   ALT 12 (L) 02/21/2016   AST 12 (L) 02/21/2016   ALKPHOS 73 02/21/2016     Microbiology: Recent Results (from the past 240 hour(s))  Blood culture (routine x 2)     Status: Abnormal   Collection Time: 02/21/16  2:16 PM  Result Value Ref Range Status   Specimen Description LEFT ANTECUBITAL  Final   Special Requests BOTTLES DRAWN AEROBIC AND ANAEROBIC [facility] EACH  Final   Culture  Setup Time   Final    GRAM NEGATIVE RODS RECOVERED FROM THE AEROBIC BOTTLE Gram Stain Report Called to,Read Back By and Verified With: YOUNG, J AT 0750 BY HUFFINES,S ON 02/23/16 RIOS,A. [facility]) AT 0935 ON 02/23/2016 BY BAUGHAM,M. Performed at [place] BY AND VERIFIED WITH: Karlene Einstein, PHARMD AT [id number] ON 02/23/16 BY C. JESSUP,  MLT. Performed at [place] (A)  Final   Report Status 02/25/2016 FINAL  Final   Organism ID, Bacteria COMAMONAS ACIDOVORANS  Final      Susceptibility   Comamonas acidovorans - MIC*    CEFAZOLIN 32 INTERMEDIATE Intermediate     CEFEPIME <=1 SENSITIVE Sensitive     CEFTAZIDIME <=1 SENSITIVE Sensitive     CEFTRIAXONE <=1 SENSITIVE Sensitive     CIPROFLOXACIN <=0.25 SENSITIVE Sensitive     GENTAMICIN >=16 RESISTANT Resistant     IMIPENEM <=0.25 SENSITIVE Sensitive     TRIMETH/SULFA <=20 SENSITIVE Sensitive     PIP/TAZO <=4 SENSITIVE Sensitive     * COMAMONAS ACIDOVORANS  Blood Culture ID Panel (Reflexed)     Status: None   Collection Time: 02/21/16  2:16 PM  Result Value Ref Range Status   Enterococcus species NOT DETECTED NOT DETECTED Final   Listeria monocytogenes NOT DETECTED NOT DETECTED Final   Staphylococcus species NOT DETECTED NOT DETECTED Final   Staphylococcus aureus NOT DETECTED NOT DETECTED Final   Streptococcus species NOT DETECTED NOT DETECTED Final   Streptococcus agalactiae NOT DETECTED NOT DETECTED Final   Streptococcus pneumoniae NOT DETECTED NOT DETECTED Final   Streptococcus pyogenes NOT DETECTED NOT DETECTED Final   Acinetobacter baumannii NOT DETECTED NOT DETECTED Final   Enterobacteriaceae species NOT DETECTED NOT DETECTED Final   Enterobacter  cloacae complex NOT DETECTED NOT DETECTED Final   Escherichia coli NOT DETECTED NOT DETECTED Final   Klebsiella oxytoca NOT DETECTED NOT DETECTED Final   Klebsiella pneumoniae NOT DETECTED NOT DETECTED Final   Proteus species NOT DETECTED NOT DETECTED Final   Serratia marcescens NOT DETECTED NOT DETECTED Final   Haemophilus influenzae NOT DETECTED NOT DETECTED Final   Neisseria meningitidis NOT DETECTED NOT DETECTED Final   Pseudomonas aeruginosa NOT DETECTED NOT DETECTED Final   Candida albicans NOT DETECTED NOT DETECTED Final   Candida glabrata NOT DETECTED NOT DETECTED  Final   Candida krusei NOT DETECTED NOT DETECTED Final   Candida parapsilosis NOT DETECTED NOT DETECTED Final   Candida tropicalis NOT DETECTED NOT DETECTED Final    Comment: Performed at [facility]  Blood culture (routine x 2)     Status: None (Preliminary result)   Collection Time: 02/21/16  2:34 PM  Result Value Ref Range Status   Specimen Description BLOOD LEFT HAND  Final   Special Requests BOTTLES DRAWN AEROBIC AND ANAEROBIC 8CC EACH  Final   Culture NO GROWTH 4 DAYS  Final   Report Status PENDING  Incomplete  Surgical pcr screen     Status: None   Collection Time: 02/22/16 10:34 AM  Result Value Ref Range Status   MRSA, PCR NEGATIVE NEGATIVE Final   Staphylococcus aureus NEGATIVE NEGATIVE Final    Comment:        The Xpert SA Assay (FDA approved for NASAL specimens in patients over 2 years of age), is one component of a comprehensive surveillance program.  Test performance has been validated by [facility] for patients greater than or equal to 55 year old. It is not intended to diagnose infection nor to guide or monitor treatment.   Aerobic Culture (superficial specimen)     Status: None   Collection Time: 02/22/16 11:57 AM  Result Value Ref Range Status   Specimen Description ABSCESS  Final   Special Requests   Final    STERNOCLAVICULAR SPECIMEN A PATIENT ON FOLLOWING  VANCOMYCIN   Gram Stain   Final    ABUNDANT WBC PRESENT, PREDOMINANTLY PMN NO ORGANISMS SEEN    Culture FEW PSEUDOMONAS AERUGINOSA  Final   Report Status 02/25/2016 FINAL  Final   Organism ID, Bacteria PSEUDOMONAS AERUGINOSA  Final      Susceptibility   Pseudomonas aeruginosa - MIC*    CEFTAZIDIME 4 SENSITIVE Sensitive     CIPROFLOXACIN <=0.25 SENSITIVE Sensitive     GENTAMICIN <=1 SENSITIVE Sensitive     IMIPENEM 2 SENSITIVE Sensitive     PIP/TAZO 8 SENSITIVE Sensitive     CEFEPIME 2 SENSITIVE Sensitive     * FEW PSEUDOMONAS AERUGINOSA  Aerobic/Anaerobic Culture (surgical/deep  wound)     Status: None (Preliminary result)   Collection Time: 02/22/16 12:00 PM  Result Value Ref Range Status   Specimen Description ABSCESS  Final   Special Requests   Final    STERNOCLAVICULAR SPECIMEN B PATIENT ON FOLLOWING  VANCOMYCIN   Gram Stain   Final    ABUNDANT WBC PRESENT,BOTH PMN AND MONONUCLEAR NO ORGANISMS SEEN Gram Stain Report Called to,Read Back By and Verified With: B GORDON,RN AT [id number] 02/22/16 BY L BENFIELD    Culture FEW PSEUDOMONAS AERUGINOSA  Final   Report Status PENDING  Incomplete   Organism ID, Bacteria PSEUDOMONAS AERUGINOSA  Final      Susceptibility   Pseudomonas aeruginosa - MIC*    CEFTAZIDIME 2 SENSITIVE Sensitive  CIPROFLOXACIN <=0.25 SENSITIVE Sensitive     GENTAMICIN <=1 SENSITIVE Sensitive     IMIPENEM 2 SENSITIVE Sensitive     PIP/TAZO <=4 SENSITIVE Sensitive     CEFEPIME <=1 SENSITIVE Sensitive     * FEW PSEUDOMONAS AERUGINOSA    Impression/Plan:  1. Septic arthritis - Pseudomonas.  Will need prolonged course of antibiotics.  2. Right ventricular vegetation - blood cultures noted and similar to Pseudomonas.  I will change to cefepime which she will need for 6 weeks through December 25th.  She will need to do this at a SNF since she can't go home with a picc line.  She is aware of this.   If she refuses placement for some reason, would need to use cipro 750 mg twice a day orally for the same time, but with her mild elevation in creat, would not be ideal and may need to be adjusted during treatment.  3.  Substance abuse - will need continued efforts at remaining drug free.    I will sign off and we will arrange follow up in our office.   Thanks

## 2016-02-25 NOTE — Progress Notes (Signed)
Pharmacy Antibiotic Note  [name] is a 45 y.o. female admitted on 02/21/2016 with cellulitis. 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 It also shows septic emboli in the lungs and an abscess over the sternoclavicular joint. The patient underwent I&D of the sternoclavicular joint abscess on 11/12 by CVTS.  The patient was originally started on Vancomycin + Zosyn >> then transitioned to Vancomycin + Rocephin on 11/12 per ID >> then Cefepime monotherapy on 11/13 given Pseudomonas in sternal wound and vegetation noted >> broadened back to Zosyn on 11/14 to cover for possible anaerobes >> now to transition back to Cefepime per ID for an intended LOT of 6 weeks (thru 12/25)  The patient's is noted to still have AKI, though appears to have peaked and is trending down, SCr 1.53 << 1.57 (baseline 0.7-0.8), estimated CrCl~45-50 ml/min. Will continue monitor this closely but will still be aggressive with Cefepime dosing given Pseudomonas cultures.   Plan: 1. Discontinue Zosyn 2. Resume Cefepime 2g IV every 12 hours 3. Will continue to follow renal function, culture results, LOT, and antibiotic de-escalation plans   Height: 5\' 1"  (154.9 cm) Weight: 177 lb 0.5 oz (80.3 kg) IBW/kg (Calculated) : 47.8  Temp (24hrs), Avg:98.5 F (36.9 C), Min:98.2 F (36.8 C), Max:98.7 F (37.1 C)   Recent Labs Lab 02/21/16 1415 02/21/16 1417 02/22/16 0610 02/23/16 0445 02/24/16 0447 02/25/16 0500  WBC 10.9*  --  7.8  --  9.5 8.9  CREATININE 0.59  --  0.80 1.42* 1.57* 1.53*  LATICACIDVEN  --  0.7  --   --   --   --     Estimated Creatinine Clearance: 45 mL/min (by C-G formula based on SCr of 1.53 mg/dL (H)).    Allergies  Allergen Reactions  . Doxycycline Other (See Comments)    burns  . Mango Flavor Hives and Swelling  . Pyridium [Phenazopyridine Hcl] Other (See Comments)    Irritation to skin  . Tramadol Nausea And Vomiting    Antimicrobials this admission:  Zosyn  11/11 >> 11/12; restart 11/13>> 11/15 Vanc 11/11 >> 11/13 CTX 11/12 >> 11/13 Cefepime 11/13 >>11/13; restart 11/15  Dose adjustments this admission:   Microbiology results:  11/11 BCx: Comamonas acidovorans (pan-S EXCEPT I-Cefaz, R-Gent) 11/11 BCID: negative 11/12 Abscess cx (intra-op): Pseudomonas, pan sens 11/12 MRSA PCR: neg 11/14 BCx: sent  Thank you for allowing pharmacy to be a part of this patient's care.  Alycia Rossetti, PharmD, BCPS Clinical Pharmacist Pager: [phone] Clinical phone for 02/25/2016 from 7a-3:30p: [phone] If after 3:30p, please call main pharmacy at: [phone] 02/25/2016 2:58 PM

## 2016-02-25 NOTE — Consult Note (Addendum)
[place] Nurse wound consult note Reason for Consult: Vac dressing changed to right clavicle area. Wound type: Full thickness post-op wound.  Removed one piece of white foam and 2 pieces of black foam Measurement: 9X4X1cm with deeper tunneling wound and exposed bone in the center, depth is 2.5 cm in this location Wound bed: 80% beefy red, 10% exposed bone, 10% yellow to inner deeper wound bed Drainage (amount, consistency, odor) Small amt pink drainage, no odor Periwound: Intact skin surrounding Dressing procedure/placement/frequency: Applied one small piece black sponge to inner wound bed and another black piece over the outer wound to 124mm cont suction.  Pt tolerated with mod amt discomfort after pain mends given. Plan for dressing change on Fri. Julien Girt MSN, RN, [place], [place], [place]

## 2016-02-25 NOTE — Care Management Important Message (Signed)
Important Message  Patient Details  Name: [name] MRN: [id number] Date of Birth: 08-Jul-1970   Medicare Important Message Given:  Yes    [name] 02/25/2016, 10:26 AM

## 2016-02-25 NOTE — Progress Notes (Addendum)
      [address]       [place] [address]             [phone]       2 Days Post-Op Procedure(s) (LRB): DRESSING CHANGE UNDER ANESTHESIA  WITH WOUND VAC PLACEMENT (N/A)  Subjective: Patient with pain at wound VAC site.  Objective: Vital signs in last 24 hours: Temp:  [98.5 F (36.9 C)-98.7 F (37.1 C)] 98.7 F (37.1 C) (11/15 0454) Pulse Rate:  [50-54] 50 (11/15 0454) Cardiac Rhythm: Sinus bradycardia (11/15 0700) Resp:  [18] 18 (11/15 0454) BP: (150-158)/(78-88) 158/78 (11/15 0454) SpO2:  [98 %-99 %] 98 % (11/15 0454) Weight:  [177 lb 0.5 oz (80.3 kg)] 177 lb 0.5 oz (80.3 kg) (11/14 2058)     Intake/Output from previous day: 11/14 0701 - 11/15 0700 In: 2560 [P.O.:480; I.V.:2080] Out: 2651 [Urine:2600; Drains:50; Stool:1]   Physical Exam:  Cardiovascular: RRR Pulmonary: Clear to auscultation bilaterally Wound: VAC in place   Lab Results: CBC: Recent Labs  02/24/16 0447 02/25/16 0500  WBC 9.5 8.9  HGB 7.9* 7.8*  HCT 24.3* 24.6*  PLT 316 336   BMET:  Recent Labs  02/24/16 0447 02/25/16 0500  NA 139 138  K 3.7 3.7  CL 110 108  CO2 23 24  GLUCOSE 117* 116*  BUN 10 9  CREATININE 1.57* 1.53*  CALCIUM 7.9* 8.4*    PT/INR:  Recent Labs  02/22/16 1037  LABPROT 13.6  INR 1.04   ABG:  INR: Will add last result for INR, ABG once components are confirmed Will add last 4 CBG results once components are confirmed  Assessment/Plan:  1. CV-SB in the 50's 2.Right [place]er joint infedtion-she has no fever and WBC "WNL". Wound VAC changed earlier this am. Wound notes seen. 3. ID-On Zosyn. Wound culture showed Pseuodmonas Aeruginosa and is sensitive to Zosyn. Cipro or Cefipime are more sensitive, but will defer to infectious disease.  ZIMMERMAN,DONIELLE MPA-C 02/25/2016,10:01 AM Patient seen and examined, agree with above Continue with VAC Cefepime per ID  Remo Lipps C. Roxan Hockey, MD [facility] [phone]

## 2016-02-25 NOTE — Progress Notes (Signed)
[facility] PROGRESS NOTE  [name] [id number] DOB: 02-14-1971 DOA: 02/21/2016  PCP: [name] Patient  Brief History/Interval Summary: 46 y.o.femalewith a history of IV drug use (primarily heroin), hepatitis C. Patient has swollen area above her sternoclavicular joint with pain that radiated into her shoulder. Evaluation revealed septic arthritis of the right sternoclavicular joint. Patient was seen by cardiothoracic surgery. She underwent surgical intervention.  Reason for Visit: Septic arthritis of the right sternoclavicular joint  Consultants: Cardiothoracic surgery. Infectious disease.  Procedures: Incision and drainage of the right sternoclavicular joint on November 12, followed by dressing changes in the OR.  Antibiotics: Zosyn  Subjective/Interval History: Patient continues to have pain in her upper chest area at the site of infection. She denies any nausea or vomiting. She is asking about getting back on treatment for her history of heroine abuse. She has been told to pursue this as outpatient.  ROS: Denies any chest pain or shortness of breath  Objective:  Vital Signs  Vitals:   02/24/16 0906 02/24/16 2058 02/25/16 0454 02/25/16 1028  BP: (!) 150/87 (!) 150/88 (!) 158/78 (!) 158/80  Pulse: (!) 54 (!) 54 (!) 50 (!) 45  Resp: 18 18 18 18   Temp: 97.6 F (36.4 C) 98.5 F (36.9 C) 98.7 F (37.1 C) 98.2 F (36.8 C)  TempSrc: Oral Oral Oral Oral  SpO2: 99% 99% 98% 100%  Weight:  80.3 kg (177 lb 0.5 oz)    Height:        Intake/Output Summary (Last 24 hours) at 02/25/16 1308 Last data filed at 02/25/16 0900  Gross per 24 hour  Intake             2680 ml  Output             2951 ml  Net             -271 ml   Filed Weights   02/22/16 2040 02/23/16 2135 02/24/16 2058  Weight: 76.3 kg (168 lb 3.4 oz) 80.1 kg (176 lb 9.4 oz) 80.3 kg (177 lb 0.5 oz)    General appearance: alert, cooperative, appears stated age and no distress Resp: clear to  auscultation bilaterally Cardio: regular rate and rhythm, S1, S2 normal, no murmur, click, rub or gallop Chest wall: Dressing noted over the upper chest area, near sternoclavicular joint on the right. Wound VAC is noted. GI: soft, non-tender; bowel sounds normal; no masses,  no organomegaly Extremities: extremities normal, atraumatic, no cyanosis or edema Neurologic: Awake and alert. Oriented 3. No focal neurological deficits are noted.  Lab Results:  Data Reviewed: I have personally reviewed following labs and imaging studies  CBC:  Recent Labs Lab 02/21/16 1415 02/22/16 0610 02/24/16 0447 02/25/16 0500  WBC 10.9* 7.8 9.5 8.9  NEUTROABS 9.4*  --   --   --   HGB 10.1* 9.0* 7.9* 7.8*  HCT 31.0* 28.1* 24.3* 24.6*  MCV 79.7 78.7 79.2 79.4  PLT 433* 363 316 [id number]    Basic Metabolic Panel:  Recent Labs Lab 02/21/16 1415 02/22/16 0610 02/23/16 0445 02/24/16 0447 02/25/16 0500  NA 136 138 137 139 138  K 3.8 3.8 3.9 3.7 3.7  CL 103 106 106 110 108  CO2 26 26 24 23 24   GLUCOSE 88 115* 146* 117* 116*  BUN 7 7 12 10 9   CREATININE 0.59 0.80 1.42* 1.57* 1.53*  CALCIUM 8.7* 8.6* 8.1* 7.9* 8.4*    GFR: Estimated Creatinine Clearance: 45 mL/min (by C-G  formula based on SCr of 1.53 mg/dL (H)).  Liver Function Tests:  Recent Labs Lab 02/21/16 1415  AST 12*  ALT 12*  ALKPHOS 73  BILITOT 0.7  PROT 7.5  ALBUMIN 2.7*    Coagulation Profile:  Recent Labs Lab 02/22/16 1037  INR 1.04     Recent Results (from the past 240 hour(s))  Blood culture (routine x 2)     Status: Abnormal   Collection Time: 02/21/16  2:16 PM  Result Value Ref Range Status   Specimen Description LEFT ANTECUBITAL  Final   Special Requests BOTTLES DRAWN AEROBIC AND ANAEROBIC [facility] EACH  Final   Culture  Setup Time   Final    GRAM NEGATIVE RODS RECOVERED FROM THE AEROBIC BOTTLE Gram Stain Report Called to,Read Back By and Verified With: YOUNG, J AT 0750 BY HUFFINES,S ON 02/23/16 RIOS,A. [facility]) AT  [place] ON 02/23/2016 BY BAUGHAM,M. Performed at [place], READ BACK BY AND VERIFIED WITH: Karlene Einstein, PHARMD AT [place] ON 02/23/16 BY C. JESSUP, MLT. Performed at [place] (A)  Final   Report Status 02/25/2016 FINAL  Final   Organism ID, Bacteria COMAMONAS ACIDOVORANS  Final      Susceptibility   Comamonas acidovorans - MIC*    CEFAZOLIN 32 INTERMEDIATE Intermediate     CEFEPIME <=1 SENSITIVE Sensitive     CEFTAZIDIME <=1 SENSITIVE Sensitive     CEFTRIAXONE <=1 SENSITIVE Sensitive     CIPROFLOXACIN <=0.25 SENSITIVE Sensitive     GENTAMICIN >=16 RESISTANT Resistant     IMIPENEM <=0.25 SENSITIVE Sensitive     TRIMETH/SULFA <=20 SENSITIVE Sensitive     PIP/TAZO <=4 SENSITIVE Sensitive     * COMAMONAS ACIDOVORANS  Blood Culture ID Panel (Reflexed)     Status: None   Collection Time: 02/21/16  2:16 PM  Result Value Ref Range Status   Enterococcus species NOT DETECTED NOT DETECTED Final   Listeria monocytogenes NOT DETECTED NOT DETECTED Final   Staphylococcus species NOT DETECTED NOT DETECTED Final   Staphylococcus aureus NOT DETECTED NOT DETECTED Final   Streptococcus species NOT DETECTED NOT DETECTED Final   Streptococcus agalactiae NOT DETECTED NOT DETECTED Final   Streptococcus pneumoniae NOT DETECTED NOT DETECTED Final   Streptococcus pyogenes NOT DETECTED NOT DETECTED Final   Acinetobacter baumannii NOT DETECTED NOT DETECTED Final   Enterobacteriaceae species NOT DETECTED NOT DETECTED Final   Enterobacter cloacae complex NOT DETECTED NOT DETECTED Final   Escherichia coli NOT DETECTED NOT DETECTED Final   Klebsiella oxytoca NOT DETECTED NOT DETECTED Final   Klebsiella pneumoniae NOT DETECTED NOT DETECTED Final   Proteus species NOT DETECTED NOT DETECTED Final   Serratia marcescens NOT DETECTED NOT DETECTED Final   Haemophilus influenzae NOT DETECTED NOT DETECTED Final   Neisseria meningitidis NOT  DETECTED NOT DETECTED Final   Pseudomonas aeruginosa NOT DETECTED NOT DETECTED Final   Candida albicans NOT DETECTED NOT DETECTED Final   Candida glabrata NOT DETECTED NOT DETECTED Final   Candida krusei NOT DETECTED NOT DETECTED Final   Candida parapsilosis NOT DETECTED NOT DETECTED Final   Candida tropicalis NOT DETECTED NOT DETECTED Final    Comment: Performed at [facility]  Blood culture (routine x 2)     Status: None (Preliminary result)   Collection Time: 02/21/16  2:34 PM  Result Value Ref Range Status   Specimen Description BLOOD LEFT HAND  Final   Special Requests BOTTLES DRAWN AEROBIC AND  ANAEROBIC 8CC EACH  Final   Culture NO GROWTH 4 DAYS  Final   Report Status PENDING  Incomplete  Surgical pcr screen     Status: None   Collection Time: 02/22/16 10:34 AM  Result Value Ref Range Status   MRSA, PCR NEGATIVE NEGATIVE Final   Staphylococcus aureus NEGATIVE NEGATIVE Final    Comment:        The Xpert SA Assay (FDA approved for NASAL specimens in patients over 56 years of age), is one component of a comprehensive surveillance program.  Test performance has been validated by [facility] for patients greater than or equal to 107 year old. It is not intended to diagnose infection nor to guide or monitor treatment.   Aerobic Culture (superficial specimen)     Status: None (Preliminary result)   Collection Time: 02/22/16 11:57 AM  Result Value Ref Range Status   Specimen Description ABSCESS  Final   Special Requests   Final    STERNOCLAVICULAR SPECIMEN A PATIENT ON FOLLOWING  VANCOMYCIN   Gram Stain   Final    ABUNDANT WBC PRESENT, PREDOMINANTLY PMN NO ORGANISMS SEEN    Culture   Final    FEW PSEUDOMONAS AERUGINOSA SUSCEPTIBILITIES TO FOLLOW    Report Status PENDING  Incomplete  Aerobic/Anaerobic Culture (surgical/deep wound)     Status: None (Preliminary result)   Collection Time: 02/22/16 12:00 PM  Result Value Ref Range Status   Specimen Description  ABSCESS  Final   Special Requests   Final    STERNOCLAVICULAR SPECIMEN B PATIENT ON FOLLOWING  VANCOMYCIN   Gram Stain   Final    ABUNDANT WBC PRESENT,BOTH PMN AND MONONUCLEAR NO ORGANISMS SEEN Gram Stain Report Called to,Read Back By and Verified With: B GORDON,RN AT [id number] 02/22/16 BY L BENFIELD    Culture FEW PSEUDOMONAS AERUGINOSA  Final   Report Status PENDING  Incomplete   Organism ID, Bacteria PSEUDOMONAS AERUGINOSA  Final      Susceptibility   Pseudomonas aeruginosa - MIC*    CEFTAZIDIME 2 SENSITIVE Sensitive     CIPROFLOXACIN <=0.25 SENSITIVE Sensitive     GENTAMICIN <=1 SENSITIVE Sensitive     IMIPENEM 2 SENSITIVE Sensitive     PIP/TAZO <=4 SENSITIVE Sensitive     CEFEPIME <=1 SENSITIVE Sensitive     * FEW PSEUDOMONAS AERUGINOSA      Radiology Studies: US Renal  Result Date: 02/24/2016 CLINICAL DATA:  Acute renal injury EXAM: RENAL / URINARY TRACT ULTRASOUND COMPLETE COMPARISON:  CT abdomen and pelvis December 18, 2013 FINDINGS: Right Kidney: Length: 14.6 cm. Echogenicity is increased. Renal cortical thickness is normal. No mass, perinephric fluid, or hydronephrosis visualized. No sonographically demonstrable calculus or ureterectasis. Left Kidney: Length: 13.7 cm. Echogenicity is increased. Renal cortical thickness is normal. No mass or hydronephrosis visualized. There is mild perinephric fluid on the left. No sonographically demonstrable calculus or ureterectasis. Bladder: Appears normal for degree of bladder distention. IMPRESSION: Mild perinephric fluid on the left, a finding of uncertain etiology. This finding could be indicative of early pyelonephritis on the left. No renal abscess seen. Both kidneys are echogenic, consistent with medical renal disease. No hydronephrosis noted on either side. Electronically Signed   By: Lowella Grip III M.D.   On: 02/24/2016 17:25     Medications:  Scheduled: . piperacillin-tazobactam (ZOSYN)  IV  3.375 g Intravenous Q8H    Continuous: . sodium chloride 100 mL/hr at 02/25/16 0457   [id number] **OR** acetaminophen, cloNIDine, loperamide, morphine injection, ondansetron **OR** ondansetron (  ZOFRAN) IV, polyethylene glycol, sodium chloride flush  Assessment/Plan:  Principal Problem:   Septic arthritis of right sternoclavicular joint (HCC) Active Problems:   Obesity   Polysubstance abuse   Depression   IV drug abuse   Hepatitis C infection   COPD (chronic obstructive pulmonary disease) (HCC)   Abscess of chest (HCC)   Opioid abuse   Pulmonary embolism, septic (HCC)   Anxiety   Cigarette smoker   Endocarditis of tricuspid valve    Septic arthritis of the right sternoclavicular joint Patient has a history of IVDU. She presented with right sternoclavicular joint swelling, pain and low-grade fever past several weeks. CT scan showed septic arthritis, seen by CTS, Dr. Roxan Hockey. Taken to the OR on 02/22/2016. Subsequent dressing changes were done in the OR and a bone back was placed. The patient was initially placed on vancomycin and Zosyn. Change to cefepime and now back on Zosyn. Has left upper extremity PICC line placed on 02/21/2016 at [facility]. Wound culture is growing pseudomonas. Infectious disease is following.  Bacteremia, GNR  1/2 culture shows gram-negative bacteria. This has been identified as Comamonas acidovorans.  Tricuspid valve endocarditis TEE done on 11/13 and showed small vegetation involving the septal leaflet of the tricuspid valve. Vegetation measures 0.80.2 cm without evidence of tricuspid valve deterioration. Will need long-term IV antibiotics as determined by infectious disease.  Septic pulmonary embolism 2 nodules one measures 1.7 cm and the other one 0.6 cm suspicious for septic emboli in the RML. 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 Continue current antibiotics.  Acute kidney injury Patient did  experience some acute renal failure. Could've been due to vancomycin. Toradol was discontinued. IV fluids has been increased. Renal function is stable this morning. Continue to monitor urine output. Renal ultrasound does not show any hydronephrosis. Medical renal disease was noted.   Normocytic anemia Hemoglobin remains low but stable. No evidence for overt bleeding.  History of IV drug use with heroin Started on Bentyl, Imodium and clonidine as needed. IV morphine as needed to control withdrawal symptoms and for postoperative pain. The goal overall is to taper off the opioids to be discontinued prior to discharge. Social worker consulted to provide outpatient resources for outpatient.  Hepatitis C Appears to be has chronic hepatitis, no transaminitis or coagulopathy. HIV nonreactive.  DVT Prophylaxis: SCDs    Code Status: Full code  Family Communication: Discussed with the patient  Disposition Plan: Management as outlined above. Await further cardiothoracic and ID input.    LOS: 4 days   [facility] Pager [phone] 02/25/2016, 1:08 PM  If 7PM-7AM, please contact night-coverage at [email or web address], password [facility]

## 2016-02-26 LAB — CBC
HCT: 24.1 % — ABNORMAL LOW (ref 36.0–46.0)
Hemoglobin: 7.7 g/dL — ABNORMAL LOW (ref 12.0–15.0)
MCH: 25.5 pg — ABNORMAL LOW (ref 26.0–34.0)
MCHC: 32 g/dL (ref 30.0–36.0)
MCV: 79.8 fL (ref 78.0–100.0)
Platelets: 309 10*3/uL (ref 150–400)
RBC: 3.02 MIL/uL — ABNORMAL LOW (ref 3.87–5.11)
RDW: 16.6 % — ABNORMAL HIGH (ref 11.5–15.5)
WBC: 8.6 10*3/uL (ref 4.0–10.5)

## 2016-02-26 LAB — BASIC METABOLIC PANEL
Anion gap: 5 (ref 5–15)
BUN: 11 mg/dL (ref 6–20)
CO2: 23 mmol/L (ref 22–32)
Calcium: 8.2 mg/dL — ABNORMAL LOW (ref 8.9–10.3)
Chloride: 109 mmol/L (ref 101–111)
Creatinine, Ser: 1.35 mg/dL — ABNORMAL HIGH (ref 0.44–1.00)
GFR calc Af Amer: 54 mL/min — ABNORMAL LOW (ref >60)
GFR calc non Af Amer: 47 mL/min — ABNORMAL LOW (ref >60)
Glucose, Bld: 107 mg/dL — ABNORMAL HIGH (ref 65–99)
Potassium: 4.1 mmol/L (ref 3.5–5.1)
Sodium: 137 mmol/L (ref 135–145)

## 2016-02-26 LAB — CULTURE, BLOOD (ROUTINE X 2): Culture: NO GROWTH

## 2016-02-26 MED ORDER — ALTEPLASE 2 MG IJ SOLR
2.0000 mg | Freq: Once | INTRAMUSCULAR | Status: AC
  Administered 2016-02-26: 2 mg
  Filled 2016-02-26: qty 2

## 2016-02-26 NOTE — Clinical Social Work Placement (Addendum)
   CLINICAL SOCIAL WORK PLACEMENT  NOTE  Date:  02/26/2016  Patient Details  Name: IBUKUNOLUWA LEVENTIS MRN: [id number] Date of Birth: 1971-01-12  Clinical Social Work is seeking post-discharge placement for this patient at the [place] level of care (*CSW will initial, date and re-position this form in  chart as items are completed):  No (Talked with patient regarding the facility response she received)   Patient/family provided with [facility] Work Department's list of facilities offering this level of care within the geographic area requested by the patient (or if unable, by the patient's family).  Yes   Patient/family informed of their freedom to choose among providers that offer the needed level of care, that participate in Medicare, Medicaid or managed care program needed by the patient, have an available bed and are willing to accept the patient.  No   Patient/family informed of [facility]'s ownership interest in [facility] and [facility], as well as of the fact that they are under no obligation to receive care at these facilities.  PASRR submitted to [employer] on 02/26/16 (Level II PASRR. Clinicals submitted on 11/16)     PASRR number received on       Existing PASRR number confirmed on       FL2 transmitted to all facilities in geographic area requested by pt/family on 02/26/16. Faxed out to [employer].     FL2 transmitted to all facilities within larger geographic area on 02/26/16 (Patient requested CSW search in [facility]) Clinicals sent to [facility] at 7:15 pm.   Patient informed that his/her managed care company has contracts with or will negotiate with certain facilities, including the following:        Yes (Patient informed that [facility] responded yes.)   Patient/family informed of bed offers received.  Patient chooses bed at       Physician recommends and patient chooses bed at      Patient to be transferred to    on  .  Patient to be transferred to facility by       Patient family notified on   of transfer.  Name of family member notified:        PHYSICIAN       Additional Comment:    _______________________________________________ Sable Feil, LCSW 02/26/2016, 7:12 PM

## 2016-02-26 NOTE — Progress Notes (Addendum)
[facility] PROGRESS NOTE  CHARLA FURY [id number] DOB: 1970-05-19 DOA: 02/21/2016  PCP: [name] Patient  Brief History/Interval Summary: 45 y.o.femalewith a history of IV drug use (primarily heroin), hepatitis C. Patient has swollen area above her sternoclavicular joint with pain that radiated into her shoulder. Evaluation revealed septic arthritis of the right sternoclavicular joint. Patient was seen by cardiothoracic surgery. She underwent surgical intervention.  Reason for Visit: Septic arthritis of the right sternoclavicular joint  Consultants: Cardiothoracic surgery. Infectious disease.  Procedures: Incision and drainage of the right sternoclavicular joint on November 12, followed by dressing changes in the OR.  Antibiotics: Cefepime  Subjective/Interval History: Patient is starting to feel better. Pain is reasonably well controlled. Denies any nausea or vomiting.   ROS: Denies any chest pain or shortness of breath  Objective:  Vital Signs  Vitals:   02/25/16 1806 02/25/16 2223 02/26/16 0532 02/26/16 0945  BP: (!) 158/69 (!) 158/62 (!) 157/62 (!) 150/58  Pulse: (!) 50 71 (!) 49 (!) 55  Resp: 18 16 18 18   Temp: 98.3 F (36.8 C) 98.2 F (36.8 C) 98.3 F (36.8 C) 98.2 F (36.8 C)  TempSrc: Oral Oral Oral Oral  SpO2: 100% 98% 97% 98%  Weight:      Height:        Intake/Output Summary (Last 24 hours) at 02/26/16 1302 Last data filed at 02/26/16 1000  Gross per 24 hour  Intake             1920 ml  Output             1400 ml  Net              520 ml   Filed Weights   02/22/16 2040 02/23/16 2135 02/24/16 2058  Weight: 76.3 kg (168 lb 3.4 oz) 80.1 kg (176 lb 9.4 oz) 80.3 kg (177 lb 0.5 oz)    General appearance: alert, cooperative, appears stated age and no distress Resp: clear to auscultation bilaterally Cardio: regular rate and rhythm, S1, S2 normal, no murmur, click, rub or gallop Chest wall: Dressing noted over the upper chest area, near  sternoclavicular joint on the right. Wound VAC is noted. GI: soft, non-tender; bowel sounds normal; no masses,  no organomegaly Neurologic: Awake and alert. Oriented 3. No focal neurological deficits are noted.  Lab Results:  Data Reviewed: I have personally reviewed following labs and imaging studies  CBC:  Recent Labs Lab 02/21/16 1415 02/22/16 0610 02/24/16 0447 02/25/16 0500 02/26/16 0505  WBC 10.9* 7.8 9.5 8.9 8.6  NEUTROABS 9.4*  --   --   --   --   HGB 10.1* 9.0* 7.9* 7.8* 7.7*  HCT 31.0* 28.1* 24.3* 24.6* 24.1*  MCV 79.7 78.7 79.2 79.4 79.8  PLT 433* 363 316 336 [id number]    Basic Metabolic Panel:  Recent Labs Lab 02/22/16 0610 02/23/16 0445 02/24/16 0447 02/25/16 0500 02/26/16 0505  NA 138 137 139 138 137  K 3.8 3.9 3.7 3.7 4.1  CL 106 106 110 108 109  CO2 26 24 23 24 23   GLUCOSE 115* 146* 117* 116* 107*  BUN 7 12 10 9 11   CREATININE 0.80 1.42* 1.57* 1.53* 1.35*  CALCIUM 8.6* 8.1* 7.9* 8.4* 8.2*    GFR: Estimated Creatinine Clearance: 51 mL/min (by C-G formula based on SCr of 1.35 mg/dL (H)).  Liver Function Tests:  Recent Labs Lab 02/21/16 1415  AST 12*  ALT 12*  ALKPHOS 73  BILITOT 0.7  PROT 7.5  ALBUMIN 2.7*    Coagulation Profile:  Recent Labs Lab 02/22/16 1037  INR 1.04     Recent Results (from the past 240 hour(s))  Blood culture (routine x 2)     Status: Abnormal   Collection Time: 02/21/16  2:16 PM  Result Value Ref Range Status   Specimen Description LEFT ANTECUBITAL  Final   Special Requests BOTTLES DRAWN AEROBIC AND ANAEROBIC [facility] EACH  Final   Culture  Setup Time   Final    GRAM NEGATIVE RODS RECOVERED FROM THE AEROBIC BOTTLE Gram Stain Report Called to,Read Back By and Verified With: YOUNG, J AT 0750 BY HUFFINES,S ON 02/23/16 RIOS,A. [facility]) AT 0935 ON 02/23/2016 BY BAUGHAM,M. Performed at [place], READ BACK BY AND VERIFIED WITH: Karlene Einstein, PHARMD AT [place] ON 02/23/16 BY C. JESSUP,  MLT. Performed at [place] (A)  Final   Report Status 02/25/2016 FINAL  Final   Organism ID, Bacteria COMAMONAS ACIDOVORANS  Final      Susceptibility   Comamonas acidovorans - MIC*    CEFAZOLIN 32 INTERMEDIATE Intermediate     CEFEPIME <=1 SENSITIVE Sensitive     CEFTAZIDIME <=1 SENSITIVE Sensitive     CEFTRIAXONE <=1 SENSITIVE Sensitive     CIPROFLOXACIN <=0.25 SENSITIVE Sensitive     GENTAMICIN >=16 RESISTANT Resistant     IMIPENEM <=0.25 SENSITIVE Sensitive     TRIMETH/SULFA <=20 SENSITIVE Sensitive     PIP/TAZO <=4 SENSITIVE Sensitive     * COMAMONAS ACIDOVORANS  Blood Culture ID Panel (Reflexed)     Status: None   Collection Time: 02/21/16  2:16 PM  Result Value Ref Range Status   Enterococcus species NOT DETECTED NOT DETECTED Final   Listeria monocytogenes NOT DETECTED NOT DETECTED Final   Staphylococcus species NOT DETECTED NOT DETECTED Final   Staphylococcus aureus NOT DETECTED NOT DETECTED Final   Streptococcus species NOT DETECTED NOT DETECTED Final   Streptococcus agalactiae NOT DETECTED NOT DETECTED Final   Streptococcus pneumoniae NOT DETECTED NOT DETECTED Final   Streptococcus pyogenes NOT DETECTED NOT DETECTED Final   Acinetobacter baumannii NOT DETECTED NOT DETECTED Final   Enterobacteriaceae species NOT DETECTED NOT DETECTED Final   Enterobacter cloacae complex NOT DETECTED NOT DETECTED Final   Escherichia coli NOT DETECTED NOT DETECTED Final   Klebsiella oxytoca NOT DETECTED NOT DETECTED Final   Klebsiella pneumoniae NOT DETECTED NOT DETECTED Final   Proteus species NOT DETECTED NOT DETECTED Final   Serratia marcescens NOT DETECTED NOT DETECTED Final   Haemophilus influenzae NOT DETECTED NOT DETECTED Final   Neisseria meningitidis NOT DETECTED NOT DETECTED Final   Pseudomonas aeruginosa NOT DETECTED NOT DETECTED Final   Candida albicans NOT DETECTED NOT DETECTED Final   Candida glabrata NOT DETECTED NOT DETECTED  Final   Candida krusei NOT DETECTED NOT DETECTED Final   Candida parapsilosis NOT DETECTED NOT DETECTED Final   Candida tropicalis NOT DETECTED NOT DETECTED Final    Comment: Performed at [facility]  Blood culture (routine x 2)     Status: None   Collection Time: 02/21/16  2:34 PM  Result Value Ref Range Status   Specimen Description BLOOD LEFT HAND  Final   Special Requests BOTTLES DRAWN AEROBIC AND ANAEROBIC [place]  Final   Culture NO GROWTH 5 DAYS  Final   Report Status 02/26/2016 FINAL  Final  Surgical pcr screen     Status: None  Collection Time: 02/22/16 10:34 AM  Result Value Ref Range Status   MRSA, PCR NEGATIVE NEGATIVE Final   Staphylococcus aureus NEGATIVE NEGATIVE Final    Comment:        The Xpert SA Assay (FDA approved for NASAL specimens in patients over 80 years of age), is one component of a comprehensive surveillance program.  Test performance has been validated by [facility] for patients greater than or equal to 74 year old. It is not intended to diagnose infection nor to guide or monitor treatment.   Aerobic Culture (superficial specimen)     Status: None   Collection Time: 02/22/16 11:57 AM  Result Value Ref Range Status   Specimen Description ABSCESS  Final   Special Requests   Final    STERNOCLAVICULAR SPECIMEN A PATIENT ON FOLLOWING  VANCOMYCIN   Gram Stain   Final    ABUNDANT WBC PRESENT, PREDOMINANTLY PMN NO ORGANISMS SEEN    Culture FEW PSEUDOMONAS AERUGINOSA  Final   Report Status 02/25/2016 FINAL  Final   Organism ID, Bacteria PSEUDOMONAS AERUGINOSA  Final      Susceptibility   Pseudomonas aeruginosa - MIC*    CEFTAZIDIME 4 SENSITIVE Sensitive     CIPROFLOXACIN <=0.25 SENSITIVE Sensitive     GENTAMICIN <=1 SENSITIVE Sensitive     IMIPENEM 2 SENSITIVE Sensitive     PIP/TAZO 8 SENSITIVE Sensitive     CEFEPIME 2 SENSITIVE Sensitive     * FEW PSEUDOMONAS AERUGINOSA  Aerobic/Anaerobic Culture (surgical/deep wound)     Status:  None (Preliminary result)   Collection Time: 02/22/16 12:00 PM  Result Value Ref Range Status   Specimen Description ABSCESS  Final   Special Requests   Final    STERNOCLAVICULAR SPECIMEN B PATIENT ON FOLLOWING  VANCOMYCIN   Gram Stain   Final    ABUNDANT WBC PRESENT,BOTH PMN AND MONONUCLEAR NO ORGANISMS SEEN Gram Stain Report Called to,Read Back By and Verified With: B GORDON,RN AT [id number] 02/22/16 BY L BENFIELD    Culture   Final    FEW PSEUDOMONAS AERUGINOSA NO ANAEROBES ISOLATED; CULTURE IN PROGRESS FOR 5 DAYS    Report Status PENDING  Incomplete   Organism ID, Bacteria PSEUDOMONAS AERUGINOSA  Final      Susceptibility   Pseudomonas aeruginosa - MIC*    CEFTAZIDIME 2 SENSITIVE Sensitive     CIPROFLOXACIN <=0.25 SENSITIVE Sensitive     GENTAMICIN <=1 SENSITIVE Sensitive     IMIPENEM 2 SENSITIVE Sensitive     PIP/TAZO <=4 SENSITIVE Sensitive     CEFEPIME <=1 SENSITIVE Sensitive     * FEW PSEUDOMONAS AERUGINOSA  Culture, blood (routine x 2)     Status: None (Preliminary result)   Collection Time: 02/24/16 11:51 AM  Result Value Ref Range Status   Specimen Description BLOOD RIGHT ANTECUBITAL  Final   Special Requests IN PEDIATRIC BOTTLE 2CC  Final   Culture NO GROWTH 1 DAY  Final   Report Status PENDING  Incomplete  Culture, blood (routine x 2)     Status: None (Preliminary result)   Collection Time: 02/24/16 11:57 AM  Result Value Ref Range Status   Specimen Description BLOOD RIGHT HAND  Final   Special Requests IN PEDIATRIC BOTTLE 2CC  Final   Culture NO GROWTH 1 DAY  Final   Report Status PENDING  Incomplete      Radiology Studies: US Renal  Result Date: 02/24/2016 CLINICAL DATA:  Acute renal injury EXAM: RENAL / URINARY TRACT ULTRASOUND COMPLETE COMPARISON:  CT abdomen and pelvis December 18, 2013 FINDINGS: Right Kidney: Length: 14.6 cm. Echogenicity is increased. Renal cortical thickness is normal. No mass, perinephric fluid, or hydronephrosis visualized. No  sonographically demonstrable calculus or ureterectasis. Left Kidney: Length: 13.7 cm. Echogenicity is increased. Renal cortical thickness is normal. No mass or hydronephrosis visualized. There is mild perinephric fluid on the left. No sonographically demonstrable calculus or ureterectasis. Bladder: Appears normal for degree of bladder distention. IMPRESSION: Mild perinephric fluid on the left, a finding of uncertain etiology. This finding could be indicative of early pyelonephritis on the left. No renal abscess seen. Both kidneys are echogenic, consistent with medical renal disease. No hydronephrosis noted on either side. Electronically Signed   By: Lowella Grip III M.D.   On: 02/24/2016 17:25     Medications:  Scheduled: . ceFEPime (MAXIPIME) IV  2 g Intravenous Q12H   Continuous: . sodium chloride 100 mL/hr at 02/26/16 0118   [id number] **OR** acetaminophen, cloNIDine, loperamide, morphine injection, ondansetron **OR** ondansetron (ZOFRAN) IV, oxyCODONE-acetaminophen, polyethylene glycol, sodium chloride flush  Assessment/Plan:  Principal Problem:   Septic arthritis of right sternoclavicular joint (HCC) Active Problems:   Obesity   Polysubstance abuse   Depression   IV drug abuse   Hepatitis C infection   COPD (chronic obstructive pulmonary disease) (HCC)   Abscess of chest (HCC)   Opioid abuse   Pulmonary embolism, septic (HCC)   Anxiety   Cigarette smoker   Endocarditis of tricuspid valve    Septic arthritis of the right sternoclavicular joint Patient has a history of IVDU. She presented with right sternoclavicular joint swelling, pain and low-grade fever past several weeks. CT scan showed septic arthritis, seen by CTS, Dr. Roxan Hockey. Taken to the OR on 02/22/2016. Subsequent dressing changes were done in the OR and a bone back was placed. The patient was initially placed on vancomycin and Zosyn. Has left upper extremity PICC line placed on 02/21/2016 at [facility]. Wound culture is growing pseudomonas. Infectious disease is following. Recommendation is for cefepime to be given for 6 weeks through December 25.  Bacteremia, GNR  1/2 culture shows gram-negative bacteria. This has been identified as Comamonas acidovorans. Same recommendations for antibiotics as above.  Tricuspid valve endocarditis TEE done on 11/13 and showed small vegetation involving the septal leaflet of the tricuspid valve. Vegetation measures 0.80.2 cm without evidence of tricuspid valve deterioration. Will need long-term IV antibiotics as mentioned above.  Septic pulmonary embolism 2 nodules one measures 1.7 cm and the other one 0.6 cm suspicious for septic emboli in the RML. 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 Continue current antibiotics.  Acute kidney injury Patient did experience acute renal failure. Could've been due to vancomycin. Toradol was discontinued. IV fluids has been increased. Renal function is improving slowly. Continue to monitor urine output. Renal ultrasound does not show any hydronephrosis. Medical renal disease was noted.   Normocytic anemia Hemoglobin remains low but stable. No evidence for overt bleeding. Check anemia panel.  History of IV drug use with heroin Started on Bentyl, Imodium and clonidine as needed. IV morphine as needed to control withdrawal symptoms and for postoperative pain. Change to oral narcotics. Plan will be to eventually wean her off of it. Social worker consulted to provide outpatient resources for outpatient detox.  Hepatitis C Appears to be has chronic hepatitis, no transaminitis or coagulopathy. HIV nonreactive.  DVT Prophylaxis: SCDs    Code Status: Full code  Family Communication: Discussed  with the patient  Disposition Plan: Management as outlined above. Cardiothoracic surgery is planning another dressing change in the OR tomorrow. Patient will need to go to  skilled nursing facility for IV antibiotics due to her history of heroin abuse. She does have a PICC line. Social worker is following. Plan will be to discharge to SNF when cleared by cardiothoracic surgery.    LOS: 5 days   [place] [facility] Pager [phone] 02/26/2016, 1:02 PM  If 7PM-7AM, please contact night-coverage at [email or web address], password [facility]

## 2016-02-26 NOTE — Progress Notes (Addendum)
      [address]       [place] [address]             [phone]      3 Days Post-Op Procedure(s) (LRB): DRESSING CHANGE UNDER ANESTHESIA  WITH WOUND VAC PLACEMENT (N/A) Subjective: Feels ok this morning. Having some nausea before meals.   Objective: Vital signs in last 24 hours: Temp:  [98.2 F (36.8 C)-98.3 F (36.8 C)] 98.3 F (36.8 C) (11/16 0532) Pulse Rate:  [45-71] 49 (11/16 0532) Cardiac Rhythm: Sinus bradycardia (11/16 0800) Resp:  [16-18] 18 (11/16 0532) BP: (157-158)/(62-80) 157/62 (11/16 0532) SpO2:  [97 %-100 %] 97 % (11/16 0532)     Intake/Output from previous day: 11/15 0701 - 11/16 0700 In: 1530 [P.O.:480; I.V.:1000; IV Piggyback:50] Out: 1700 [Urine:1700] Intake/Output this shift: No intake/output data recorded.  General appearance: alert, cooperative and no distress Heart: sinus brady Lungs: clear to auscultation bilaterally Abdomen: soft, non-tender; bowel sounds normal; no masses,  no organomegaly Extremities: extremities normal, atraumatic, no cyanosis or edema Wound: wound vac applied to right subclavian joint  Lab Results:  Recent Labs  02/25/16 0500 02/26/16 0505  WBC 8.9 8.6  HGB 7.8* 7.7*  HCT 24.6* 24.1*  PLT 336 309   BMET:  Recent Labs  02/25/16 0500 02/26/16 0505  NA 138 137  K 3.7 4.1  CL 108 109  CO2 24 23  GLUCOSE 116* 107*  BUN 9 11  CREATININE 1.53* 1.35*  CALCIUM 8.4* 8.2*    PT/INR: No results for input(s): LABPROT, INR in the last 72 hours. ABG No results found for: PHART, HCO3, TCO2, ACIDBASEDEF, O2SAT CBG (last 3)  No results for input(s): GLUCAP in the last 72 hours.  Assessment/Plan: S/P Procedure(s) (LRB): DRESSING CHANGE UNDER ANESTHESIA  WITH WOUND VAC PLACEMENT (N/A)   1. CV-SB in the 40's-50's 2.Right [place] joint infection-she has no fever and WBC "WNL". Wound VAC change tomorrow. 3. ID-ID following. On Cefepime.  4. Nausea, pre-medicate with zofran before meals     LOS:  5 days    Elgie Collard 02/26/2016

## 2016-02-26 NOTE — Clinical Social Work Note (Addendum)
Clinical Social Work Assessment  Patient Details  Name: [name] MRN: [id number] Date of Birth: 1971-02-01  Date of referral:  02/25/16               Reason for consult:  Facility Placement                Permission sought to share information with:  Other (Patient alert/oriented and did not want to provide her children's names) Permission granted to share information::  No  Name::        Agency::     Relationship::     Contact Information:     Housing/Transportation Living arrangements for the past 2 months:  Single Family Home (Patient lives with her father Alyssa Grove and helps take care of him) Source of Information:  Patient Patient Interpreter Needed:  None Criminal Activity/Legal Involvement Pertinent to Current Situation/Hospitalization:  No - Comment as needed Significant Relationships:  Adult Children, Other Family Members Lives with:  Parents (Patient reported that she moved from her home and in with her dad to help take care of him) Do you feel safe going back to the place where you live?  Yes Need for family participation in patient care:  Yes (Comment)  Care giving concerns:  Patient in agreement with ST rehab, but is concerned about her father, Alyssa Grove, who she lives with and helps to take care of.  Social Worker assessment / plan:  CSW talked with patient regarding discharge disposition and recommendation of ST rehab for continuation of her IV antibiotics at discharge. CSW talked with patient regarding facility search, and probable reason for lack of responses (patient advised of response from 1 facility-[facility]), due to her very recent IV drug use and why this a concern for skilled nursing facilities. Patient listened attentively to explanation, but expressed that she did not understand why this would be a problem as she would not be using in the facility and this was discussed. Ms. Tejada expressed that she is glad to be in the hospital as this is time  when she is not using drugs and expressed the desire to stop using.    Patient requested that CSW look for a facility in [facility], so that she can be closer to her family and her father, Alyssa Grove does not have to drive so far to see her. Ms. Poertner reported that her dad came to see her today and she was upset with him for driving to [facility] to visit with her.  Patient reports that she moved in with her dad to help look after him and she and her dad pay all of the [facility] bills. Patient talked about her family, which includes her 2 sons, ages 83 and 44, 2 grandson (ages 33 years old and 65 weeks old), and her son and daughter-in-law. When asked, Ms. Ricco did not want to provide names of her family members and CSW expressed understanding.    Employment status:  Disabled (Comment on whether or not currently receiving Disability) Insurance information:  Medicare PT Recommendations:  [place] / Referral to community resources:  [place] (Patient provided with name and location of facility that responded YES to accepting her for rehab)  Patient/Family's Response to care:  Patient did not express any concerns regarding her care during hospitalization.  Patient/Family's Understanding of and Emotional Response to Diagnosis, Current Treatment, and Prognosis: Not discussed.  Emotional Assessment Appearance:  Appears older than stated age Attitude/Demeanor/Rapport:  Other (Appropriate) Affect (typically observed):  Appropriate, Pleasant Orientation:  Oriented to Self, Oriented to Place, Oriented to Situation, Oriented to  Time Alcohol / Substance use:  Illicit Drugs, Alcohol Use (Patient reported that she uses drugs IV, Cocaine, Marijuana, and Methamphetamines. Patient reported that she does not drink alcohol) Psych involvement (Current and /or in the community):  No (Comment)  Discharge Needs  Concerns to be addressed:  Discharge Planning  Concerns Readmission within the last 30 days:  No Current discharge risk:  Substance Abuse Barriers to Discharge:  Continued Medical Work up   [employer] Mila Homer, [place] 02/26/2016, 6:57 PM

## 2016-02-26 NOTE — NC FL2 (Signed)
[place] LEVEL OF CARE SCREENING TOOL     IDENTIFICATION  Patient Name: [name] Birthdate: [birth year] Sex: female Admission Date (Current Location): 02/21/2016  [facility] and [place] Number:  [employer] and Address:  [facility] [place] [place] [address] [place], [place] [address]      Provider Number: [id number]  Attending Physician Name and Address:  Bonnielee Haff, MD  Relative Name and Phone Number:  Derry Lory (Father) [phone]    Current Level of Care: Hospital Recommended Level of Care: [place] Prior Approval Number: Submitted 02/26/2016 - MUST ID# [id number]  Date Approved/Denied:   PASRR Number:  Submitted for PASRR 11/16  Discharge Plan: SNF    Current Diagnoses: Patient Active Problem List   Diagnosis Date Noted  . 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 Anxiety 02/22/2016  . Cigarette smoker 02/22/2016  . Abscess of chest ([place]) 02/21/2016  . Opioid abuse 02/21/2016  . Pulmonary embolism, septic ([place]) 02/21/2016  . Septic arthritis of right sternoclavicular joint ([place]) 02/21/2016  . COPD (chronic obstructive pulmonary disease) ([place]) 10/03/2014  . Hepatitis C infection 07/13/2014  . IV drug abuse   . Depression 06/17/2014  . Polysubstance abuse 06/16/2014    Class: Chronic  . Obesity 06/06/2012    Orientation RESPIRATION BLADDER Height & Weight     Self, Time, Situation, Place  Normal Continent Weight: 177 lb 0.5 oz (80.3 kg) Height:  5\' 1"  (154.9 cm)  BEHAVIORAL SYMPTOMS/MOOD NEUROLOGICAL BOWEL NUTRITION STATUS      Continent Diet (Regular - Thin Liquids)  AMBULATORY STATUS COMMUNICATION OF NEEDS Skin   Independent Verbally Surgical wounds, Wound Vac (Wound Vac - Right Chest).  Incision right chest                     Personal Care Assistance Level of Assistance  Bathing, Feeding, Dressing Bathing Assistance: Independent Feeding assistance: Independent Dressing  Assistance: Independent     Functional Limitations Info  Sight, Hearing, Speech Sight Info: Adequate Hearing Info: Adequate Speech Info: Adequate    SPECIAL CARE FACTORS FREQUENCY                       Contractures      Additional Factors Info  Code Status, Allergies Code Status Info: Full             Current Medications (02/26/2016):  This is the current hospital active medication list Current Facility-Administered Medications  Medication Dose Route Frequency Provider Last Rate Last Dose  . 0.9 %  sodium chloride infusion   Intravenous Continuous Verlee Monte, MD 100 mL/hr at 02/26/16 0118    . acetaminophen (TYLENOL) tablet 650 mg  650 mg Oral Q6H PRN Truett Mainland, DO       Or  . acetaminophen (TYLENOL) suppository 650 mg  650 mg Rectal Q6H PRN Tanna Savoy Stinson, DO      . ceFEPIme (MAXIPIME) 2 g in dextrose 5 % 50 mL IVPB  2 g Intravenous Q12H Rolla Flatten, RPH   2 g at 02/26/16 0324  . cloNIDine (CATAPRES) tablet 0.1 mg  0.1 mg Oral TID PRN Sid Falcon, MD   0.1 mg at 02/23/16 1845  . loperamide (IMODIUM) capsule 2 mg  2 mg Oral PRN Tanna Savoy Stinson, DO      . morphine 2 MG/ML injection 1 mg  1 mg Intravenous Q4H PRN Bonnielee Haff, MD   1  mg at 02/26/16 0832  . ondansetron (ZOFRAN) tablet 4 mg  4 mg Oral Q6H PRN Tanna Savoy Stinson, DO       Or  . ondansetron [facility]) injection 4 mg  4 mg Intravenous Q6H PRN Tanna Savoy Stinson, DO   4 mg at 02/25/16 1648  . oxyCODONE-acetaminophen (PERCOCET/ROXICET) 5-325 MG per tablet 1-2 tablet  1-2 tablet Oral Q4H PRN Bonnielee Haff, MD   2 tablet at 02/26/16 0116  . polyethylene glycol (MIRALAX / GLYCOLAX) packet 17 g  17 g Oral Daily PRN Tanna Savoy Stinson, DO      . sodium chloride flush (NS) 0.9 % injection 10-40 mL  10-40 mL Intracatheter PRN Tanna Savoy Stinson, DO   10 mL at 02/23/16 [id number]     Discharge Medications: Please see discharge summary for a list of discharge medications.  Relevant Imaging Results:  Relevant  Lab Results:   Additional Information SSN: [id number]; Patient has a PICC for [place], [place] Work [phone]

## 2016-02-27 LAB — CBC
HCT: 25 % — ABNORMAL LOW (ref 36.0–46.0)
Hemoglobin: 7.9 g/dL — ABNORMAL LOW (ref 12.0–15.0)
MCH: 25.2 pg — ABNORMAL LOW (ref 26.0–34.0)
MCHC: 31.6 g/dL (ref 30.0–36.0)
MCV: 79.6 fL (ref 78.0–100.0)
Platelets: 341 10*3/uL (ref 150–400)
RBC: 3.14 MIL/uL — ABNORMAL LOW (ref 3.87–5.11)
RDW: 16.7 % — ABNORMAL HIGH (ref 11.5–15.5)
WBC: 8.6 10*3/uL (ref 4.0–10.5)

## 2016-02-27 LAB — AEROBIC/ANAEROBIC CULTURE W GRAM STAIN (SURGICAL/DEEP WOUND)

## 2016-02-27 LAB — RETICULOCYTES
RBC.: 3.14 MIL/uL — ABNORMAL LOW (ref 3.87–5.11)
Retic Count, Absolute: 78.5 10*3/uL (ref 19.0–186.0)
Retic Ct Pct: 2.5 % (ref 0.4–3.1)

## 2016-02-27 LAB — BASIC METABOLIC PANEL
Anion gap: 8 (ref 5–15)
BUN: 13 mg/dL (ref 6–20)
CO2: 21 mmol/L — ABNORMAL LOW (ref 22–32)
Calcium: 8.3 mg/dL — ABNORMAL LOW (ref 8.9–10.3)
Chloride: 106 mmol/L (ref 101–111)
Creatinine, Ser: 1.2 mg/dL — ABNORMAL HIGH (ref 0.44–1.00)
GFR calc Af Amer: >60 mL/min (ref >60)
GFR calc non Af Amer: 54 mL/min — ABNORMAL LOW (ref >60)
Glucose, Bld: 110 mg/dL — ABNORMAL HIGH (ref 65–99)
Potassium: 4 mmol/L (ref 3.5–5.1)
Sodium: 135 mmol/L (ref 135–145)

## 2016-02-27 LAB — IRON AND TIBC
Iron: 31 ug/dL (ref 28–170)
Saturation Ratios: 10 % — ABNORMAL LOW (ref 10.4–31.8)
TIBC: 325 ug/dL (ref 250–450)
UIBC: 294 ug/dL

## 2016-02-27 LAB — VITAMIN B12: Vitamin B-12: 273 pg/mL (ref 180–914)

## 2016-02-27 LAB — FERRITIN: Ferritin: 43 ng/mL (ref 11–307)

## 2016-02-27 LAB — FOLATE: Folate: 8.9 ng/mL (ref >5.9)

## 2016-02-27 MED ORDER — DIPHENHYDRAMINE HCL 25 MG PO CAPS
25.0000 mg | ORAL_CAPSULE | Freq: Once | ORAL | Status: AC
  Administered 2016-02-28: 25 mg via ORAL
  Filled 2016-02-27: qty 1

## 2016-02-27 NOTE — Progress Notes (Signed)
CSW has verified that pt's PASRR number has been issued.  Pt has chosen bed at [facility].  VM message left for their Admissions Coordinator re: tx. Await return call.  Creta Levin, LCSW Evening/ED Coverage [id number]

## 2016-02-27 NOTE — Consult Note (Signed)
[place] Nurse wound consult note Reason for Consult: Vac dressing changed to right clavicle area. CT PA at bedside to assess wound appearance. Wound type: Full thickness post-op wound.   Measurement: Wound has decreased slightly in size and depth since previous assessment. Wound bed: 90% beefy red, 10% exposed bone, inner wound bed is cleaner than Wed. Drainage (amount, consistency, odor) Small amt pink drainage, no odor Periwound: Intact skin surrounding Dressing procedure/placement/frequency: Applied one small piece white sponge to inner wound bed and another black piece over the outer wound to 132mm cont suction.  Pt tolerated with mod amt discomfort after pain mends given. Plan for dressing change on [address] MSN, [place], [place], [place], [place]

## 2016-02-27 NOTE — Progress Notes (Signed)
PROGRESS NOTE  QUEEN VLAD  [id number] DOB: 1970/06/02 DOA: 02/21/2016 PCP: [name] Patient Outpatient Specialists:  Subjective: Patient feels okay, denies any complaints this morning.  Brief Narrative:  45 y.o.femalewith a history of IV drug use (primarily heroin), hepatitis C. Patient has swollen area above her sternoclavicular joint with pain that radiated into her shoulder. Evaluation revealed septic arthritis of the right sternoclavicular joint. Patient was seen by cardiothoracic surgery. She underwent I&D for her septic arthritis with placement of wound VAC.  Assessment & Plan:   Principal Problem:   Septic arthritis of right sternoclavicular joint (HCC) Active Problems:   Obesity   Polysubstance abuse   Depression   IV drug abuse   Hepatitis C infection   COPD (chronic obstructive pulmonary disease) (HCC)   Abscess of chest (HCC)   Opioid abuse   Pulmonary embolism, septic (HCC)   Anxiety   Cigarette smoker   Endocarditis of tricuspid valve   Septic arthritis of the right sternoclavicular joint -Patient has a history of IVDU. She presented with right sternoclavicular joint swelling, pain and low-grade fever past several weeks.  -CT scan showed septic arthritis, seen by CTS, Dr. Roxan Hockey. Taken to the OR on 02/22/2016.  -Subsequent dressing changes were done in the OR and a VAC was placed.  -The patient was initially placed on vancomycin and Zosyn.  -Has left upper extremity PICC line placed on 02/21/2016 at [facility].  -Wound culture is growing pseudomonas. cefepime to be given for 6 weeks through December 25.  Bacteremia, GNR 1/2 culture shows gram-negative bacteria. This has been identified as Comamonas acidovorans. Same recommendations for antibiotics as above.  Tricuspid valve endocarditis TEE doneon 11/13and showed small vegetation involving the septal leaflet of the tricuspid valve. Vegetation measures 0.80.2 cm without evidence of  tricuspid valve deterioration. Will need long-term IV antibiotics as mentioned above.  Septic pulmonary embolism 2 nodules one measures 1.7 cm and the other one 0.6 cm suspicious for septic emboli in the RML. 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 Continue current antibiotics.  Acute kidney injury Patient did experience acute renal failure. Could've been due to vancomycin. Toradol was discontinued. IV fluids has been increased. Renal function is improving slowly. Continue to monitor urine output. Renal ultrasound does not show any hydronephrosis. Medical renal disease was noted.   Normocytic anemia Hemoglobin remains low but stable. No evidence for overt bleeding. Check anemia panel.  History of IV drug use with heroin Started on Bentyl, Imodium and clonidine as needed. IV morphine as needed to control withdrawal symptoms and for postoperative pain. Change to oral narcotics. Plan will be to eventually wean her off of it. Social worker consulted to provide outpatient resources for outpatient detox.  Hepatitis C Appears to be has chronic hepatitis, no transaminitis or coagulopathy. HIV nonreactive.   DVT prophylaxis:  Code Status: Full Code Family Communication:  Disposition Plan:  Diet: Diet regular Room service appropriate? Yes; Fluid consistency: Thin  Consultants:   None  Procedures:   None  Antimicrobials:   Cefepime  Objective: Vitals:   02/26/16 1847 02/26/16 2055 02/27/16 0439 02/27/16 0900  BP: (!) 145/64 (!) 150/72 (!) 154/54 (!) 160/71  Pulse: 65 (!) 53 (!) 57 64  Resp: 18 18 17 18   Temp: 98.4 F (36.9 C) 98 F (36.7 C) 98.2 F (36.8 C)   TempSrc: Oral Oral Oral Oral  SpO2:  100% 97% 98%  Weight:  84 kg (185 lb  3 oz)    Height:        Intake/Output Summary (Last 24 hours) at 02/27/16 1435 Last data filed at 02/27/16 1318  Gross per 24 hour  Intake             2320 ml  Output              2002 ml  Net              318 ml   Filed Weights   02/23/16 2135 02/24/16 2058 02/26/16 2055  Weight: 80.1 kg (176 lb 9.4 oz) 80.3 kg (177 lb 0.5 oz) 84 kg (185 lb 3 oz)    Examination: General exam: Appears calm and comfortable  Respiratory system: Clear to auscultation. Respiratory effort normal. Cardiovascular system: S1 & S2 heard, RRR. No JVD, murmurs, rubs, gallops or clicks. No pedal edema. Gastrointestinal system: Abdomen is nondistended, soft and nontender. No organomegaly or masses felt. Normal bowel sounds heard. Central nervous system: Alert and oriented. No focal neurological deficits. Extremities: Symmetric 5 x 5 power. Skin: No rashes, lesions or ulcers Psychiatry: Judgement and insight appear normal. Mood & affect appropriate.   Data Reviewed: I have personally reviewed following labs and imaging studies  CBC:  Recent Labs Lab 02/21/16 1415 02/22/16 0610 02/24/16 0447 02/25/16 0500 02/26/16 0505 02/27/16 0523  WBC 10.9* 7.8 9.5 8.9 8.6 8.6  NEUTROABS 9.4*  --   --   --   --   --   HGB 10.1* 9.0* 7.9* 7.8* 7.7* 7.9*  HCT 31.0* 28.1* 24.3* 24.6* 24.1* 25.0*  MCV 79.7 78.7 79.2 79.4 79.8 79.6  PLT 433* 363 316 336 309 [id number]   Basic Metabolic Panel:  Recent Labs Lab 02/23/16 0445 02/24/16 0447 02/25/16 0500 02/26/16 0505 02/27/16 0523  NA 137 139 138 137 135  K 3.9 3.7 3.7 4.1 4.0  CL 106 110 108 109 106  CO2 24 23 24 23  21*  GLUCOSE 146* 117* 116* 107* 110*  BUN 12 10 9 11 13   CREATININE 1.42* 1.57* 1.53* 1.35* 1.20*  CALCIUM 8.1* 7.9* 8.4* 8.2* 8.3*   GFR: Estimated Creatinine Clearance: 58.8 mL/min (by C-G formula based on SCr of 1.2 mg/dL (H)). Liver Function Tests:  Recent Labs Lab 02/21/16 1415  AST 12*  ALT 12*  ALKPHOS 73  BILITOT 0.7  PROT 7.5  ALBUMIN 2.7*   No results for input(s): LIPASE, AMYLASE in the last 168 hours. No results for input(s): AMMONIA in the last 168 hours. Coagulation Profile:  Recent Labs Lab  02/22/16 1037  INR 1.04   Cardiac Enzymes: No results for input(s): CKTOTAL, CKMB, CKMBINDEX, TROPONINI in the last 168 hours. BNP (last 3 results) No results for input(s): PROBNP in the last 8760 hours. HbA1C: No results for input(s): HGBA1C in the last 72 hours. CBG: No results for input(s): GLUCAP in the last 168 hours. Lipid Profile: No results for input(s): CHOL, HDL, LDLCALC, TRIG, CHOLHDL, LDLDIRECT in the last 72 hours. Thyroid Function Tests: No results for input(s): TSH, T4TOTAL, FREET4, T3FREE, THYROIDAB in the last 72 hours. Anemia Panel:  Recent Labs  02/27/16 0523  VITAMINB12 273  FOLATE 8.9  FERRITIN 43  TIBC 325  IRON 31  RETICCTPCT 2.5   Urine analysis:    Component Value Date/Time   COLORURINE YELLOW 02/21/2016 1600   APPEARANCEUR CLEAR 02/21/2016 1600   LABSPEC <1.005 (L) 02/21/2016 1600   PHURINE 7.5 02/21/2016 1600   GLUCOSEU NEGATIVE 02/21/2016 1600   HGBUR  NEGATIVE 02/21/2016 1600   BILIRUBINUR NEGATIVE 02/21/2016 1600   KETONESUR NEGATIVE 02/21/2016 1600   PROTEINUR NEGATIVE 02/21/2016 1600   UROBILINOGEN 0.2 10/18/2014 0100   NITRITE NEGATIVE 02/21/2016 1600   LEUKOCYTESUR NEGATIVE 02/21/2016 1600   Sepsis Labs: @LABRCNTIP (procalcitonin:4,lacticidven:4)  ) Recent Results (from the past 240 hour(s))  Blood culture (routine x 2)     Status: Abnormal   Collection Time: 02/21/16  2:16 PM  Result Value Ref Range Status   Specimen Description LEFT ANTECUBITAL  Final   Special Requests BOTTLES DRAWN AEROBIC AND ANAEROBIC [facility] EACH  Final   Culture  Setup Time   Final    GRAM NEGATIVE RODS RECOVERED FROM THE AEROBIC BOTTLE Gram Stain Report Called to,Read Back By and Verified With: YOUNG, J AT 0750 BY HUFFINES,S ON 02/23/16 RIOS,A. [facility]) AT [place] ON 02/23/2016 BY BAUGHAM,M. Performed at [facility], READ BACK BY AND VERIFIED WITH: Karlene Einstein, PHARMD AT [place] ON 02/23/16 BY C. JESSUP, MLT. Performed at [place] (A)  Final   Report Status 02/25/2016 FINAL  Final   Organism ID, Bacteria COMAMONAS ACIDOVORANS  Final      Susceptibility   Comamonas acidovorans - MIC*    CEFAZOLIN 32 INTERMEDIATE Intermediate     CEFEPIME <=1 SENSITIVE Sensitive     CEFTAZIDIME <=1 SENSITIVE Sensitive     CEFTRIAXONE <=1 SENSITIVE Sensitive     CIPROFLOXACIN <=0.25 SENSITIVE Sensitive     GENTAMICIN >=16 RESISTANT Resistant     IMIPENEM <=0.25 SENSITIVE Sensitive     TRIMETH/SULFA <=20 SENSITIVE Sensitive     PIP/TAZO <=4 SENSITIVE Sensitive     * COMAMONAS ACIDOVORANS  Blood Culture ID Panel (Reflexed)     Status: None   Collection Time: 02/21/16  2:16 PM  Result Value Ref Range Status   Enterococcus species NOT DETECTED NOT DETECTED Final   Listeria monocytogenes NOT DETECTED NOT DETECTED Final   Staphylococcus species NOT DETECTED NOT DETECTED Final   Staphylococcus aureus NOT DETECTED NOT DETECTED Final   Streptococcus species NOT DETECTED NOT DETECTED Final   Streptococcus agalactiae NOT DETECTED NOT DETECTED Final   Streptococcus pneumoniae NOT DETECTED NOT DETECTED Final   Streptococcus pyogenes NOT DETECTED NOT DETECTED Final   Acinetobacter baumannii NOT DETECTED NOT DETECTED Final   Enterobacteriaceae species NOT DETECTED NOT DETECTED Final   Enterobacter cloacae complex NOT DETECTED NOT DETECTED Final   Escherichia coli NOT DETECTED NOT DETECTED Final   Klebsiella oxytoca NOT DETECTED NOT DETECTED Final   Klebsiella pneumoniae NOT DETECTED NOT DETECTED Final   Proteus species NOT DETECTED NOT DETECTED Final   Serratia marcescens NOT DETECTED NOT DETECTED Final   Haemophilus influenzae NOT DETECTED NOT DETECTED Final   Neisseria meningitidis NOT DETECTED NOT DETECTED Final   Pseudomonas aeruginosa NOT DETECTED NOT DETECTED Final   Candida albicans NOT DETECTED NOT DETECTED Final   Candida glabrata NOT DETECTED NOT DETECTED Final   Candida krusei  NOT DETECTED NOT DETECTED Final   Candida parapsilosis NOT DETECTED NOT DETECTED Final   Candida tropicalis NOT DETECTED NOT DETECTED Final    Comment: Performed at [facility]  Blood culture (routine x 2)     Status: None   Collection Time: 02/21/16  2:34 PM  Result Value Ref Range Status   Specimen Description BLOOD LEFT HAND  Final   Special Requests BOTTLES DRAWN AEROBIC AND ANAEROBIC 8CC EACH  Final   Culture NO GROWTH  5 DAYS  Final   Report Status 02/26/2016 FINAL  Final  Surgical pcr screen     Status: None   Collection Time: 02/22/16 10:34 AM  Result Value Ref Range Status   MRSA, PCR NEGATIVE NEGATIVE Final   Staphylococcus aureus NEGATIVE NEGATIVE Final    Comment:        The Xpert SA Assay (FDA approved for NASAL specimens in patients over 40 years of age), is one component of a comprehensive surveillance program.  Test performance has been validated by [facility] for patients greater than or equal to 57 year old. It is not intended to diagnose infection nor to guide or monitor treatment.   Aerobic Culture (superficial specimen)     Status: None   Collection Time: 02/22/16 11:57 AM  Result Value Ref Range Status   Specimen Description ABSCESS  Final   Special Requests   Final    STERNOCLAVICULAR SPECIMEN A PATIENT ON FOLLOWING  VANCOMYCIN   Gram Stain   Final    ABUNDANT WBC PRESENT, PREDOMINANTLY PMN NO ORGANISMS SEEN    Culture FEW PSEUDOMONAS AERUGINOSA  Final   Report Status 02/25/2016 FINAL  Final   Organism ID, Bacteria PSEUDOMONAS AERUGINOSA  Final      Susceptibility   Pseudomonas aeruginosa - MIC*    CEFTAZIDIME 4 SENSITIVE Sensitive     CIPROFLOXACIN <=0.25 SENSITIVE Sensitive     GENTAMICIN <=1 SENSITIVE Sensitive     IMIPENEM 2 SENSITIVE Sensitive     PIP/TAZO 8 SENSITIVE Sensitive     CEFEPIME 2 SENSITIVE Sensitive     * FEW PSEUDOMONAS AERUGINOSA  Aerobic/Anaerobic Culture (surgical/deep wound)     Status: None   Collection Time:  02/22/16 12:00 PM  Result Value Ref Range Status   Specimen Description ABSCESS  Final   Special Requests   Final    STERNOCLAVICULAR SPECIMEN B PATIENT ON FOLLOWING  VANCOMYCIN   Gram Stain   Final    ABUNDANT WBC PRESENT,BOTH PMN AND MONONUCLEAR NO ORGANISMS SEEN Gram Stain Report Called to,Read Back By and Verified With: B GORDON,RN AT [id number] 02/22/16 BY L BENFIELD    Culture   Final    FEW PSEUDOMONAS AERUGINOSA NO ANAEROBES ISOLATED    Report Status 02/27/2016 FINAL  Final   Organism ID, Bacteria PSEUDOMONAS AERUGINOSA  Final      Susceptibility   Pseudomonas aeruginosa - MIC*    CEFTAZIDIME 2 SENSITIVE Sensitive     CIPROFLOXACIN <=0.25 SENSITIVE Sensitive     GENTAMICIN <=1 SENSITIVE Sensitive     IMIPENEM 2 SENSITIVE Sensitive     PIP/TAZO <=4 SENSITIVE Sensitive     CEFEPIME <=1 SENSITIVE Sensitive     * FEW PSEUDOMONAS AERUGINOSA  Culture, blood (routine x 2)     Status: None (Preliminary result)   Collection Time: 02/24/16 11:51 AM  Result Value Ref Range Status   Specimen Description BLOOD RIGHT ANTECUBITAL  Final   Special Requests IN PEDIATRIC BOTTLE 2CC  Final   Culture NO GROWTH 2 DAYS  Final   Report Status PENDING  Incomplete  Culture, blood (routine x 2)     Status: None (Preliminary result)   Collection Time: 02/24/16 11:57 AM  Result Value Ref Range Status   Specimen Description BLOOD RIGHT HAND  Final   Special Requests IN PEDIATRIC BOTTLE 2CC  Final   Culture NO GROWTH 2 DAYS  Final   Report Status PENDING  Incomplete     Invalid input(s): PROCALCITONIN, [place]   Radiology  Studies: No results found.      Scheduled Meds: . ceFEPime (MAXIPIME) IV  2 g Intravenous Q12H   Continuous Infusions: . sodium chloride 100 mL/hr at 02/27/16 0136     LOS: 6 days    Time spent: 35 minutes    Kazmir Oki A, MD [facility] Pager [phone]  If 7PM-7AM, please contact night-coverage [email or web address] Password [email or web address] 02/27/2016,  2:35 PM

## 2016-02-27 NOTE — Care Management Important Message (Signed)
Important Message  Patient Details  Name: [name] MRN: [id number] Date of Birth: [birth year]   Medicare Important Message Given:  Yes    Evangelos Paulino [address] 02/27/2016, 3:18 PM

## 2016-02-27 NOTE — Progress Notes (Addendum)
      [address]       [place] [id number]             [phone]      4 Days Post-Op Procedure(s) (LRB): DRESSING CHANGE UNDER ANESTHESIA  WITH WOUND VAC PLACEMENT (N/A) Subjective:  feels ok  Objective: Vital signs in last 24 hours: Temp:  [98 F (36.7 C)-98.4 F (36.9 C)] 98.2 F (36.8 C) (11/17 0439) Pulse Rate:  [53-65] 57 (11/17 0439) Cardiac Rhythm: Sinus bradycardia (11/17 0816) Resp:  [17-18] 17 (11/17 0439) BP: (145-154)/(54-72) 154/54 (11/17 0439) SpO2:  [97 %-100 %] 97 % (11/17 0439) Weight:  [185 lb 3 oz (84 kg)] 185 lb 3 oz (84 kg) (11/16 2055)  Hemodynamic parameters for last 24 hours:    Intake/Output from previous day: 11/16 0701 - 11/17 0700 In: 3200 [P.O.:840; I.V.:2360] Out: [id number] [Urine:1154; Stool:3] Intake/Output this shift: No intake/output data recorded.  Wound: inspected during VAC change, granulating well without purulence or cellulitis  Lab Results:  Recent Labs  02/26/16 0505 02/27/16 0523  WBC 8.6 8.6  HGB 7.7* 7.9*  HCT 24.1* 25.0*  PLT 309 341   BMET:  Recent Labs  02/26/16 0505 02/27/16 0523  NA 137 135  K 4.1 4.0  CL 109 106  CO2 23 21*  GLUCOSE 107* 110*  BUN 11 13  CREATININE 1.35* 1.20*  CALCIUM 8.2* 8.3*    PT/INR: No results for input(s): LABPROT, INR in the last 72 hours. ABG No results found for: PHART, HCO3, TCO2, ACIDBASEDEF, O2SAT CBG (last 3)  No results for input(s): GLUCAP in the last 72 hours.  Meds Scheduled Meds: . ceFEPime (MAXIPIME) IV  2 g Intravenous Q12H   Continuous Infusions: . sodium chloride 100 mL/hr at 02/27/16 0136   PRN Meds:.acetaminophen **OR** acetaminophen, cloNIDine, loperamide, morphine injection, ondansetron **OR** ondansetron (ZOFRAN) IV, oxyCODONE-acetaminophen, polyethylene glycol, sodium chloride flush  Xrays No results found.  Assessment/Plan: S/P Procedure(s) (LRB): DRESSING CHANGE UNDER ANESTHESIA  WITH WOUND VAC PLACEMENT (N/A)  1 Conts  VAC care, current abx 2 medical management per primary/ID   LOS: 6 days    GOLD,WAYNE E 02/27/2016 Patient seen and examined, agree with above Less pain, but still hurts with VAC change  Remo Lipps C. Roxan Hockey, MD [facility] [phone]

## 2016-02-27 NOTE — Progress Notes (Signed)
CSW viewed PASRR system this AM, No PASRR assigned to pt, CSW will continue to monitor and coordinate DC Plan to SNF.  Almedia Cordell B. Paulette Blanch [phone]

## 2016-02-28 LAB — BASIC METABOLIC PANEL
Anion gap: 6 (ref 5–15)
BUN: 16 mg/dL (ref 6–20)
CO2: 25 mmol/L (ref 22–32)
Calcium: 8.4 mg/dL — ABNORMAL LOW (ref 8.9–10.3)
Chloride: 105 mmol/L (ref 101–111)
Creatinine, Ser: 1.19 mg/dL — ABNORMAL HIGH (ref 0.44–1.00)
GFR calc Af Amer: >60 mL/min (ref >60)
GFR calc non Af Amer: 55 mL/min — ABNORMAL LOW (ref >60)
Glucose, Bld: 126 mg/dL — ABNORMAL HIGH (ref 65–99)
Potassium: 4.1 mmol/L (ref 3.5–5.1)
Sodium: 136 mmol/L (ref 135–145)

## 2016-02-28 MED ORDER — DEXTROSE 5 % IV SOLN: 2.0000 g | Freq: Two times a day (BID) | INTRAVENOUS | Status: DC

## 2016-02-28 MED ORDER — HEPARIN SOD (PORK) LOCK FLUSH 100 UNIT/ML IV SOLN
250.0000 [IU] | INTRAVENOUS | Status: AC | PRN
Start: 2016-02-28 — End: 2016-02-28
  Administered 2016-02-28: 250 [IU]

## 2016-02-28 MED ORDER — HEPARIN SOD (PORK) LOCK FLUSH 100 UNIT/ML IV SOLN
250.0000 [IU] | INTRAVENOUS | Status: AC | PRN
  Administered 2016-02-28: 250 [IU]

## 2016-02-28 MED ORDER — OXYCODONE-ACETAMINOPHEN 5-325 MG PO TABS: 1.0000 | ORAL_TABLET | ORAL | 0 refills | Status: DC | PRN

## 2016-02-28 MED ORDER — POLYETHYLENE GLYCOL 3350 17 G PO PACK: 17.0000 g | PACK | Freq: Every day | ORAL | 0 refills | Status: DC | PRN

## 2016-02-28 NOTE — Progress Notes (Signed)
Report called to Patricia at [facility].   All questions answered. 

## 2016-02-28 NOTE — Progress Notes (Signed)
Pt ready for DC. CSW contacted [facility] on call worker, facility can take pt today @ 230PM.DC Summary/Transfer Summary faxed to facility. CSW informed pt, pt will contact her family to advise and bring clothes to the facility. CSW set up transportation with [facility] for 230PM. Pt reports no other needs at this time. CSW will remain available for support as needed.  CSW is signing off.  Makaylynn Bonillas B. Owens Shark, [place], [place]

## 2016-02-28 NOTE — Care Management (Signed)
CM received call from call from Ramond Dial on [place] wound vac delivered to room with out cassette canister, Spoke with Material Management staff who states, they are unable to located a cassette cannister for the transport [facility]  Freedom wound vac.[facility] ion floor to transport patient.  CM contacted Ricki with [employer] will follow up with the return of [place] wound vac asap.

## 2016-02-28 NOTE — Care Management CHF Note (Signed)
Patient unable to be transported to SNF currently, patient has upper chest  wound vac s/p I&D placed by Cardiothoracic. CM will need to secure [facility] wound vac prior to discharge.  CM contacted CSW to update [facility].

## 2016-02-28 NOTE — Care Management Note (Signed)
Case Management Note  Patient Details  Name: [name] MRN: [id number] Date of Birth: 1971-02-14  Subjective/Objective:          Patient has swollen area above her sternoclavicular joint with pain that radiated into her shoulder. Evaluation revealed septic arthritis of the right sternoclavicular joint. Patient was seen by cardiothoracic surgery. She underwent I&D for her septic arthritis with placement of wound VAC. Patient is on IV Zosyn x 6 via PICC at SNF due hx of IVDA           Action/Plan: CM made aware of patient going to [facility] on IV Zosyn x 6 weeks and wound vac. CM contacted [facility] to arrange transport wound vac. CM Obtained,orders from CT and faxed to [facility] for wound management. Portable equipment notified wound vac will be delivered to room prior to discharge to SNF. [facility] called for transported to [place] on 6E. No further CM needs identified.  Expected Discharge Date:     02/28/16             Expected Discharge Plan:  [place]  In-House Referral:  Clinical Social Work  Discharge planning Services  CM Consult  Post Acute Care Choice:    Choice offered to:   N/A DME Arranged:  Negative pressure wound device (Freedom negative wound vac) DME Agency:     HH Arranged:    [place] Agency:     Status of Service:  Completed, signed off  If discussed at [employer] of Stay Meetings, dates discussed:    Additional CommentsLaurena Slimmer, RN 02/28/2016, 5:06 PM

## 2016-02-28 NOTE — Clinical Social Work Placement (Signed)
   CLINICAL SOCIAL WORK PLACEMENT  NOTE  Date:  02/28/2016  Patient Details  Name: [name] MRN: [id number] Date of Birth: 25-Nov-1970  Clinical Social Work is seeking post-discharge placement for this patient at the [place] level of care (*CSW will initial, date and re-position this form in  chart as items are completed):  No (Talked with patient regarding the facility response she received)   Patient/family provided with [place] Work Department's list of facilities offering this level of care within the geographic area requested by the patient (or if unable, by the patient's family).  Yes   Patient/family informed of their freedom to choose among providers that offer the needed level of care, that participate in Medicare, Medicaid or managed care program needed by the patient, have an available bed and are willing to accept the patient.  No   Patient/family informed of [place]'s ownership interest in [facility] and [facility], as well as of the fact that they are under no obligation to receive care at these facilities.  PASRR submitted to [facility] on 02/26/16 (Level II PASRR. Clinicals submitted on 11/16)     PASRR number received on       Existing PASRR number confirmed on       FL2 transmitted to all facilities in geographic area requested by pt/family on 02/26/16     FL2 transmitted to all facilities within larger geographic area on 02/26/16 (Patient requested CSW search in [facility])     Patient informed that his/her managed care company has contracts with or will negotiate with certain facilities, including the following:        Yes (Patient informed that [facility] responded yes.)   Patient/family informed of bed offers received.  Patient chooses bed at  [facility])     Physician recommends and patient chooses bed at      Patient to be transferred to  ([facility]) on 02/28/16.  Patient to be transferred to facility  by [facility]     Patient family notified on 02/28/16 of transfer.  Name of family member notified:        PHYSICIAN       Additional Comment:    _______________________________________________ Serafina Mitchell, [place] 02/28/2016, 11:16 AM

## 2016-02-28 NOTE — Discharge Summary (Signed)
Physician Discharge Summary  [name] [id number] DOB: April 09, 1971 DOA: 02/21/2016  PCP: [name] Patient  Admit date: 02/21/2016 Discharge date: 02/28/2016  Admitted From: Home Disposition: Home  Recommendations for Outpatient Follow-up:  1. Follow up with Dr. Roxan Hockey in 1-2 weeks 2. Please obtain BMP/CBC in one week 3. Continue cefepime for 37 more days until 04/05/2016  Home Health: NA Equipment/Devices:NA  Discharge Condition: Stable CODE STATUS: Full Code Diet recommendation: Diet regular Room service appropriate? Yes; Fluid consistency: Thin Diet - low sodium heart healthy  Brief/Interim Summary: 45 y.o.femalewith a history of IV drug use (primarily heroin), hepatitis C. Patient has swollen area above her sternoclavicular joint with pain that radiated into her shoulder. Evaluation revealed septic arthritis of the right sternoclavicular joint. Patient was seen by cardiothoracic surgery. She underwent I&D for her septic arthritis with placement of wound VAC.  Discharge Diagnoses:  Principal Problem:   Septic arthritis of right sternoclavicular joint ([place]) Active Problems:   Obesity   Polysubstance abuse   Depression   IV drug abuse   Hepatitis C infection   COPD (chronic obstructive pulmonary disease) (HCC)   Abscess of chest (HCC)   Opioid abuse   Pulmonary embolism, septic (HCC)   Anxiety   Cigarette smoker   Endocarditis of tricuspid valve   Septic arthritis of the right sternoclavicular joint -Patient has a history of IVDU. She presented with right sternoclavicular joint swelling, pain and low-grade fever past several weeks.  -CT scan showed septic arthritis, seen by CTS, Dr. Roxan Hockey. Taken to the OR on 02/22/2016.  -Subsequent dressing changes were done in the OR and a VAC was placed.  -The patient was initially placed on vancomycin and Zosyn.  -Has left upper extremity PICC line placed on 02/21/2016 at [facility].  -Wound culture is  growing pseudomonas and blood is showing comamonas.  -ID recommended cefepime 2 g every 12 hours for total of 6 weeks, end date is 04/05/2016  Comamonas acidovorans (Delftia acidovorans) bacteremia -1/2 culture shows Comamonas acidovorans bacteria, this is an usual finding in an immunocompetent patient.  -Per ID recommendation cefepime for total of 6 weeks.  Tricuspid valve endocarditis TEE doneon 11/13and showed small vegetation involving the septal leaflet of the tricuspid valve. Vegetation measures 0.80.2 cm without evidence of tricuspid valve deterioration. Will need long-term IV antibiotics asmentioned above.  Septic pulmonary embolism 2 nodules one measures 1.7 cm and the other one 0.6 cm suspicious for septic emboli in the RML. 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 Continue current antibiotics.  Acute kidney injury Patient did experience acute renal failure. Could've been due to vancomycin. Toradol was discontinued. IV fluids has been increased. Renal function is improving slowly. Continue to monitor urine output. Renal ultrasound does not show any hydronephrosis. Medical renal disease was noted.  Creatinine is 1.19 the time of discharge  Normocytic anemia Hemoglobin remains low but stable. No evidence for overt bleeding.Check anemia panel.  History of IV drug use with heroin Started on Bentyl, Imodium and clonidine as needed. IV morphine as needed to control withdrawal symptoms and for postoperative pain. Change to oral narcotics.  Patient sent with Percocet to the nursing home for postoperative pain, taper off to take off of opioids.  Hepatitis C Appears to be has chronic hepatitis, no transaminitis or coagulopathy. HIV nonreactive. Follow-up with ID clinic as outpatient.  Spoke with Dr. Roxy Manns prior to discharge, patient has one VAC and PICC line she going to  receive IV antibiotics at a nursing home. He okayed  her discharge to follow-up with Dr. Roxan Hockey in 1-2 weeks for the wound VAC. Even the need her to come for surgery they can bring her from the nursing home.  Discharge Instructions  Discharge Instructions    Diet - low sodium heart healthy    Complete by:  As directed    Increase activity slowly    Complete by:  As directed        Medication List    TAKE these medications   ceFEPIme 2 g in dextrose 5 % 50 mL Inject 2 g into the vein every 12 (twelve) hours.   oxyCODONE-acetaminophen 5-325 MG tablet Commonly known as:  PERCOCET/ROXICET Take 1-2 tablets by mouth every 4 (four) hours as needed for moderate pain.   polyethylene glycol packet Commonly known as:  MIRALAX / GLYCOLAX Take 17 g by mouth daily as needed for mild constipation.       Allergies  Allergen Reactions  . Doxycycline Other (See Comments)    burns  . Mango Flavor Hives and Swelling  . Pyridium [Phenazopyridine Hcl] Other (See Comments)    Irritation to skin  . Tramadol Nausea And Vomiting    Consultations:  Treatment Team:   Melrose Nakayama, MD  Procedures (Echo, Carotid, EGD, Colonoscopy, ERCP)   Radiological studies: Ct Chest Wo Contrast  Result Date: 02/21/2016 CLINICAL DATA:  45 year old female with chest growth and pain. IV drug abuse. EXAM: CT CHEST WITHOUT CONTRAST TECHNIQUE: Multidetector CT imaging of the chest was performed following the standard protocol without IV contrast. COMPARISON:  10/08/2005 chest CT.  05/03/2015 chest radiograph. FINDINGS: Cardiovascular: Normal heart size. No significant pericardial fluid/thickening. Great vessels are normal in course and caliber. Mediastinum/Nodes: No discrete thyroid nodules. Unremarkable esophagus. Mild right axillary adenopathy measuring up to 1.1 cm (series 3/image 60), new since 10/08/2005. No left axillary adenopathy. Right supraclavicular adenopathy measuring up to 1.1 cm (series 3/image 28), new. Anterior right mediastinal  prevascular adenopathy measuring up to 1.0 cm (series 3/ image 52), new. No additional pathologically enlarged mediastinal or gross hilar nodes on this noncontrast study. Lungs/Pleura: No pneumothorax. No pleural effusion. Poorly marginated 1.7 x 1.0 cm peripheral basilar right upper lobe pulmonary nodule (series 4/ image 77) with ground-glass halo, new since 2007. Poorly marginated 6 mm solid right lower lobe pulmonary nodule (series 4/ image 118), new. Apical left upper lobe 2 mm solid pulmonary nodule (series 4/ image 37) is stable since 2007 and considered benign. No additional significant pulmonary nodules. Upper abdomen: Unremarkable. Musculoskeletal: There is extensive erosive change involving the right sternoclavicular and anterior right first costo manubrial joints with associated large joint effusion causing local mass effect in the right anterior upper mediastinum. The there is an overlying 3.4 x 2.3 x 3.1 cm abscess in the medial ventral right upper chest wall involving the deep subcutaneous soft tissues and medial right pectoral muscle (series 3/ image 37). IMPRESSION: 1. Severe septic arthritis of the right sternoclavicular and right first costomanubrial joints with associated large joint effusion. Orthopedic surgery consultation advised. 2. Overlying 3.4 x 2.3 x 3.1 cm abscess in the medial ventral upper right chest wall involving the deep subcutaneous soft tissues and medial right pectoral muscle. 3. Poorly marginated 1.7 cm basilar right upper lobe and 0.6 cm right lower lobe pulmonary nodules, new since 2007, suspicious for septic emboli given the history of IV drug abuse. Follow-up post treatment chest CT is recommended in 3 months. 4. Mild  right axillary, right supraclavicular and anterior right upper mediastinal lymphadenopathy, nonspecific, probably reactive. These nodes can also be reassessed on follow-up chest CT in 3 months. Electronically Signed   By: Ilona Sorrel M.D.   On: 02/21/2016  13:41   US Renal  Result Date: 02/24/2016 CLINICAL DATA:  Acute renal injury EXAM: RENAL / URINARY TRACT ULTRASOUND COMPLETE COMPARISON:  CT abdomen and pelvis December 18, 2013 FINDINGS: Right Kidney: Length: 14.6 cm. Echogenicity is increased. Renal cortical thickness is normal. No mass, perinephric fluid, or hydronephrosis visualized. No sonographically demonstrable calculus or ureterectasis. Left Kidney: Length: 13.7 cm. Echogenicity is increased. Renal cortical thickness is normal. No mass or hydronephrosis visualized. There is mild perinephric fluid on the left. No sonographically demonstrable calculus or ureterectasis. Bladder: Appears normal for degree of bladder distention. IMPRESSION: Mild perinephric fluid on the left, a finding of uncertain etiology. This finding could be indicative of early pyelonephritis on the left. No renal abscess seen. Both kidneys are echogenic, consistent with medical renal disease. No hydronephrosis noted on either side. Electronically Signed   By: Lowella Grip III M.D.   On: 02/24/2016 17:25     Subjective:  Discharge Exam: Vitals:   02/27/16 0900 02/27/16 1720 02/27/16 2140 02/28/16 0551  BP: (!) 160/71 (!) 154/66 (!) 147/52 (!) 159/53  Pulse: 64 77 (!) 56 (!) 43  Resp: 18 18 18 17   Temp:  98.1 F (36.7 C) 98.5 F (36.9 C) 97.3 F (36.3 C)  TempSrc: Oral Oral Oral Oral  SpO2: 98% 98% 98% 97%  Weight:   85.4 kg (188 lb 4.4 oz)   Height:       General: Pt is alert, awake, not in acute distress Cardiovascular: RRR, S1/S2 +, no rubs, no gallops Respiratory: CTA bilaterally, no wheezing, no rhonchi Abdominal: Soft, NT, ND, bowel sounds + Extremities: no edema, no cyanosis   The results of significant diagnostics from this hospitalization (including imaging, microbiology, ancillary and laboratory) are listed below for reference.    Microbiology: Recent Results (from the past 240 hour(s))  Blood culture (routine x 2)     Status: Abnormal    Collection Time: 02/21/16  2:16 PM  Result Value Ref Range Status   Specimen Description LEFT ANTECUBITAL  Final   Special Requests BOTTLES DRAWN AEROBIC AND ANAEROBIC [facility] EACH  Final   Culture  Setup Time   Final    GRAM NEGATIVE RODS RECOVERED FROM THE AEROBIC BOTTLE Gram Stain Report Called to,Read Back By and Verified With: YOUNG, J AT 0750 BY HUFFINES,S ON 02/23/16 RIOS,A. [facility]) AT 0935 ON 02/23/2016 BY BAUGHAM,M. Performed at [place], READ BACK BY AND VERIFIED WITH: Karlene Einstein, PHARMD AT [place] ON 02/23/16 BY C. JESSUP, MLT. Performed at [place] (A)  Final   Report Status 02/25/2016 FINAL  Final   Organism ID, Bacteria COMAMONAS ACIDOVORANS  Final      Susceptibility   Comamonas acidovorans - MIC*    CEFAZOLIN 32 INTERMEDIATE Intermediate     CEFEPIME <=1 SENSITIVE Sensitive     CEFTAZIDIME <=1 SENSITIVE Sensitive     CEFTRIAXONE <=1 SENSITIVE Sensitive     CIPROFLOXACIN <=0.25 SENSITIVE Sensitive     GENTAMICIN >=16 RESISTANT Resistant     IMIPENEM <=0.25 SENSITIVE Sensitive     TRIMETH/SULFA <=20 SENSITIVE Sensitive     PIP/TAZO <=4 SENSITIVE Sensitive     * COMAMONAS ACIDOVORANS  Blood Culture ID Panel (Reflexed)  Status: None   Collection Time: 02/21/16  2:16 PM  Result Value Ref Range Status   Enterococcus species NOT DETECTED NOT DETECTED Final   Listeria monocytogenes NOT DETECTED NOT DETECTED Final   Staphylococcus species NOT DETECTED NOT DETECTED Final   Staphylococcus aureus NOT DETECTED NOT DETECTED Final   Streptococcus species NOT DETECTED NOT DETECTED Final   Streptococcus agalactiae NOT DETECTED NOT DETECTED Final   Streptococcus pneumoniae NOT DETECTED NOT DETECTED Final   Streptococcus pyogenes NOT DETECTED NOT DETECTED Final   Acinetobacter baumannii NOT DETECTED NOT DETECTED Final   Enterobacteriaceae species NOT DETECTED NOT DETECTED Final   Enterobacter cloacae  complex NOT DETECTED NOT DETECTED Final   Escherichia coli NOT DETECTED NOT DETECTED Final   Klebsiella oxytoca NOT DETECTED NOT DETECTED Final   Klebsiella pneumoniae NOT DETECTED NOT DETECTED Final   Proteus species NOT DETECTED NOT DETECTED Final   Serratia marcescens NOT DETECTED NOT DETECTED Final   Haemophilus influenzae NOT DETECTED NOT DETECTED Final   Neisseria meningitidis NOT DETECTED NOT DETECTED Final   Pseudomonas aeruginosa NOT DETECTED NOT DETECTED Final   Candida albicans NOT DETECTED NOT DETECTED Final   Candida glabrata NOT DETECTED NOT DETECTED Final   Candida krusei NOT DETECTED NOT DETECTED Final   Candida parapsilosis NOT DETECTED NOT DETECTED Final   Candida tropicalis NOT DETECTED NOT DETECTED Final    Comment: Performed at [facility]  Blood culture (routine x 2)     Status: None   Collection Time: 02/21/16  2:34 PM  Result Value Ref Range Status   Specimen Description BLOOD LEFT HAND  Final   Special Requests BOTTLES DRAWN AEROBIC AND ANAEROBIC 8CC EACH  Final   Culture NO GROWTH 5 DAYS  Final   Report Status 02/26/2016 FINAL  Final  Surgical pcr screen     Status: None   Collection Time: 02/22/16 10:34 AM  Result Value Ref Range Status   MRSA, PCR NEGATIVE NEGATIVE Final   Staphylococcus aureus NEGATIVE NEGATIVE Final    Comment:        The Xpert SA Assay (FDA approved for NASAL specimens in patients over 67 years of age), is one component of a comprehensive surveillance program.  Test performance has been validated by [facility] for patients greater than or equal to 50 year old. It is not intended to diagnose infection nor to guide or monitor treatment.   Aerobic Culture (superficial specimen)     Status: None   Collection Time: 02/22/16 11:57 AM  Result Value Ref Range Status   Specimen Description ABSCESS  Final   Special Requests   Final    STERNOCLAVICULAR SPECIMEN A PATIENT ON FOLLOWING  VANCOMYCIN   Gram Stain   Final     ABUNDANT WBC PRESENT, PREDOMINANTLY PMN NO ORGANISMS SEEN    Culture FEW PSEUDOMONAS AERUGINOSA  Final   Report Status 02/25/2016 FINAL  Final   Organism ID, Bacteria PSEUDOMONAS AERUGINOSA  Final      Susceptibility   Pseudomonas aeruginosa - MIC*    CEFTAZIDIME 4 SENSITIVE Sensitive     CIPROFLOXACIN <=0.25 SENSITIVE Sensitive     GENTAMICIN <=1 SENSITIVE Sensitive     IMIPENEM 2 SENSITIVE Sensitive     PIP/TAZO 8 SENSITIVE Sensitive     CEFEPIME 2 SENSITIVE Sensitive     * FEW PSEUDOMONAS AERUGINOSA  Aerobic/Anaerobic Culture (surgical/deep wound)     Status: None   Collection Time: 02/22/16 12:00 PM  Result Value Ref Range Status  Specimen Description ABSCESS  Final   Special Requests   Final    STERNOCLAVICULAR SPECIMEN B PATIENT ON FOLLOWING  VANCOMYCIN   Gram Stain   Final    ABUNDANT WBC PRESENT,BOTH PMN AND MONONUCLEAR NO ORGANISMS SEEN Gram Stain Report Called to,Read Back By and Verified With: B GORDON,RN AT [id number] 02/22/16 BY L BENFIELD    Culture   Final    FEW PSEUDOMONAS AERUGINOSA NO ANAEROBES ISOLATED    Report Status 02/27/2016 FINAL  Final   Organism ID, Bacteria PSEUDOMONAS AERUGINOSA  Final      Susceptibility   Pseudomonas aeruginosa - MIC*    CEFTAZIDIME 2 SENSITIVE Sensitive     CIPROFLOXACIN <=0.25 SENSITIVE Sensitive     GENTAMICIN <=1 SENSITIVE Sensitive     IMIPENEM 2 SENSITIVE Sensitive     PIP/TAZO <=4 SENSITIVE Sensitive     CEFEPIME <=1 SENSITIVE Sensitive     * FEW PSEUDOMONAS AERUGINOSA  Culture, blood (routine x 2)     Status: None (Preliminary result)   Collection Time: 02/24/16 11:51 AM  Result Value Ref Range Status   Specimen Description BLOOD RIGHT ANTECUBITAL  Final   Special Requests IN PEDIATRIC BOTTLE 2CC  Final   Culture NO GROWTH 4 DAYS  Final   Report Status PENDING  Incomplete  Culture, blood (routine x 2)     Status: None (Preliminary result)   Collection Time: 02/24/16 11:57 AM  Result Value Ref Range Status    Specimen Description BLOOD RIGHT HAND  Final   Special Requests IN PEDIATRIC BOTTLE 2CC  Final   Culture NO GROWTH 4 DAYS  Final   Report Status PENDING  Incomplete     Labs: BNP (last 3 results) No results for input(s): BNP in the last 8760 hours. Basic Metabolic Panel:  Recent Labs Lab 02/24/16 0447 02/25/16 0500 02/26/16 0505 02/27/16 0523 02/28/16 0415  NA 139 138 137 135 136  K 3.7 3.7 4.1 4.0 4.1  CL 110 108 109 106 105  CO2 23 24 23  21* 25  GLUCOSE 117* 116* 107* 110* 126*  BUN 10 9 11 13 16   CREATININE 1.57* 1.53* 1.35* 1.20* 1.19*  CALCIUM 7.9* 8.4* 8.2* 8.3* 8.4*   Liver Function Tests:  Recent Labs Lab 02/21/16 1415  AST 12*  ALT 12*  ALKPHOS 73  BILITOT 0.7  PROT 7.5  ALBUMIN 2.7*   No results for input(s): LIPASE, AMYLASE in the last 168 hours. No results for input(s): AMMONIA in the last 168 hours. CBC:  Recent Labs Lab 02/21/16 1415 02/22/16 0610 02/24/16 0447 02/25/16 0500 02/26/16 0505 02/27/16 0523  WBC 10.9* 7.8 9.5 8.9 8.6 8.6  NEUTROABS 9.4*  --   --   --   --   --   HGB 10.1* 9.0* 7.9* 7.8* 7.7* 7.9*  HCT 31.0* 28.1* 24.3* 24.6* 24.1* 25.0*  MCV 79.7 78.7 79.2 79.4 79.8 79.6  PLT 433* 363 316 336 309 341   Cardiac Enzymes: No results for input(s): CKTOTAL, CKMB, CKMBINDEX, TROPONINI in the last 168 hours. BNP: Invalid input(s): POCBNP CBG: No results for input(s): GLUCAP in the last 168 hours. D-Dimer No results for input(s): DDIMER in the last 72 hours. Hgb A1c No results for input(s): HGBA1C in the last 72 hours. Lipid Profile No results for input(s): CHOL, HDL, LDLCALC, TRIG, CHOLHDL, LDLDIRECT in the last 72 hours. Thyroid function studies No results for input(s): TSH, T4TOTAL, T3FREE, THYROIDAB in the last 72 hours.  Invalid input(s): FREET3 Anemia work up  Recent Labs  02/27/16 0523  VITAMINB12 273  FOLATE 8.9  FERRITIN 43  TIBC 325  IRON 31  RETICCTPCT 2.5   Urinalysis    Component Value Date/Time    COLORURINE YELLOW 02/21/2016 1600   APPEARANCEUR CLEAR 02/21/2016 1600   LABSPEC <1.005 (L) 02/21/2016 1600   PHURINE 7.5 02/21/2016 1600   GLUCOSEU NEGATIVE 02/21/2016 1600   HGBUR NEGATIVE 02/21/2016 1600   BILIRUBINUR NEGATIVE 02/21/2016 1600   KETONESUR NEGATIVE 02/21/2016 1600   PROTEINUR NEGATIVE 02/21/2016 1600   UROBILINOGEN 0.2 10/18/2014 0100   NITRITE NEGATIVE 02/21/2016 1600   LEUKOCYTESUR NEGATIVE 02/21/2016 1600   Sepsis Labs Invalid input(s): PROCALCITONIN,  WBC,  LACTICIDVEN Microbiology Recent Results (from the past 240 hour(s))  Blood culture (routine x 2)     Status: Abnormal   Collection Time: 02/21/16  2:16 PM  Result Value Ref Range Status   Specimen Description LEFT ANTECUBITAL  Final   Special Requests BOTTLES DRAWN AEROBIC AND ANAEROBIC [facility] EACH  Final   Culture  Setup Time   Final    GRAM NEGATIVE RODS RECOVERED FROM THE AEROBIC BOTTLE Gram Stain Report Called to,Read Back By and Verified With: YOUNG, J AT 0750 BY HUFFINES,S ON 02/23/16 RIOS,A. [facility]) AT [place] ON 02/23/2016 BY BAUGHAM,M. Performed at [place], READ BACK BY AND VERIFIED WITH: Karlene Einstein, PHARMD AT [id number] ON 02/23/16 BY C. JESSUP, MLT. Performed at [place] (A)  Final   Report Status 02/25/2016 FINAL  Final   Organism ID, Bacteria COMAMONAS ACIDOVORANS  Final      Susceptibility   Comamonas acidovorans - MIC*    CEFAZOLIN 32 INTERMEDIATE Intermediate     CEFEPIME <=1 SENSITIVE Sensitive     CEFTAZIDIME <=1 SENSITIVE Sensitive     CEFTRIAXONE <=1 SENSITIVE Sensitive     CIPROFLOXACIN <=0.25 SENSITIVE Sensitive     GENTAMICIN >=16 RESISTANT Resistant     IMIPENEM <=0.25 SENSITIVE Sensitive     TRIMETH/SULFA <=20 SENSITIVE Sensitive     PIP/TAZO <=4 SENSITIVE Sensitive     * COMAMONAS ACIDOVORANS  Blood Culture ID Panel (Reflexed)     Status: None   Collection Time: 02/21/16  2:16 PM  Result Value Ref  Range Status   Enterococcus species NOT DETECTED NOT DETECTED Final   Listeria monocytogenes NOT DETECTED NOT DETECTED Final   Staphylococcus species NOT DETECTED NOT DETECTED Final   Staphylococcus aureus NOT DETECTED NOT DETECTED Final   Streptococcus species NOT DETECTED NOT DETECTED Final   Streptococcus agalactiae NOT DETECTED NOT DETECTED Final   Streptococcus pneumoniae NOT DETECTED NOT DETECTED Final   Streptococcus pyogenes NOT DETECTED NOT DETECTED Final   Acinetobacter baumannii NOT DETECTED NOT DETECTED Final   Enterobacteriaceae species NOT DETECTED NOT DETECTED Final   Enterobacter cloacae complex NOT DETECTED NOT DETECTED Final   Escherichia coli NOT DETECTED NOT DETECTED Final   Klebsiella oxytoca NOT DETECTED NOT DETECTED Final   Klebsiella pneumoniae NOT DETECTED NOT DETECTED Final   Proteus species NOT DETECTED NOT DETECTED Final   Serratia marcescens NOT DETECTED NOT DETECTED Final   Haemophilus influenzae NOT DETECTED NOT DETECTED Final   Neisseria meningitidis NOT DETECTED NOT DETECTED Final   Pseudomonas aeruginosa NOT DETECTED NOT DETECTED Final   Candida albicans NOT DETECTED NOT DETECTED Final   Candida glabrata NOT DETECTED NOT DETECTED Final   Candida krusei NOT DETECTED NOT DETECTED Final   Candida parapsilosis NOT DETECTED NOT DETECTED Final  Candida tropicalis NOT DETECTED NOT DETECTED Final    Comment: Performed at [facility]  Blood culture (routine x 2)     Status: None   Collection Time: 02/21/16  2:34 PM  Result Value Ref Range Status   Specimen Description BLOOD LEFT HAND  Final   Special Requests BOTTLES DRAWN AEROBIC AND ANAEROBIC [place]  Final   Culture NO GROWTH 5 DAYS  Final   Report Status 02/26/2016 FINAL  Final  Surgical pcr screen     Status: None   Collection Time: 02/22/16 10:34 AM  Result Value Ref Range Status   MRSA, PCR NEGATIVE NEGATIVE Final   Staphylococcus aureus NEGATIVE NEGATIVE Final    Comment:        The  Xpert SA Assay (FDA approved for NASAL specimens in patients over 40 years of age), is one component of a comprehensive surveillance program.  Test performance has been validated by [facility] for patients greater than or equal to 62 year old. It is not intended to diagnose infection nor to guide or monitor treatment.   Aerobic Culture (superficial specimen)     Status: None   Collection Time: 02/22/16 11:57 AM  Result Value Ref Range Status   Specimen Description ABSCESS  Final   Special Requests   Final    STERNOCLAVICULAR SPECIMEN A PATIENT ON FOLLOWING  VANCOMYCIN   Gram Stain   Final    ABUNDANT WBC PRESENT, PREDOMINANTLY PMN NO ORGANISMS SEEN    Culture FEW PSEUDOMONAS AERUGINOSA  Final   Report Status 02/25/2016 FINAL  Final   Organism ID, Bacteria PSEUDOMONAS AERUGINOSA  Final      Susceptibility   Pseudomonas aeruginosa - MIC*    CEFTAZIDIME 4 SENSITIVE Sensitive     CIPROFLOXACIN <=0.25 SENSITIVE Sensitive     GENTAMICIN <=1 SENSITIVE Sensitive     IMIPENEM 2 SENSITIVE Sensitive     PIP/TAZO 8 SENSITIVE Sensitive     CEFEPIME 2 SENSITIVE Sensitive     * FEW PSEUDOMONAS AERUGINOSA  Aerobic/Anaerobic Culture (surgical/deep wound)     Status: None   Collection Time: 02/22/16 12:00 PM  Result Value Ref Range Status   Specimen Description ABSCESS  Final   Special Requests   Final    STERNOCLAVICULAR SPECIMEN B PATIENT ON FOLLOWING  VANCOMYCIN   Gram Stain   Final    ABUNDANT WBC PRESENT,BOTH PMN AND MONONUCLEAR NO ORGANISMS SEEN Gram Stain Report Called to,Read Back By and Verified With: B GORDON,RN AT [id number] 02/22/16 BY L BENFIELD    Culture   Final    FEW PSEUDOMONAS AERUGINOSA NO ANAEROBES ISOLATED    Report Status 02/27/2016 FINAL  Final   Organism ID, Bacteria PSEUDOMONAS AERUGINOSA  Final      Susceptibility   Pseudomonas aeruginosa - MIC*    CEFTAZIDIME 2 SENSITIVE Sensitive     CIPROFLOXACIN <=0.25 SENSITIVE Sensitive     GENTAMICIN <=1 SENSITIVE  Sensitive     IMIPENEM 2 SENSITIVE Sensitive     PIP/TAZO <=4 SENSITIVE Sensitive     CEFEPIME <=1 SENSITIVE Sensitive     * FEW PSEUDOMONAS AERUGINOSA  Culture, blood (routine x 2)     Status: None (Preliminary result)   Collection Time: 02/24/16 11:51 AM  Result Value Ref Range Status   Specimen Description BLOOD RIGHT ANTECUBITAL  Final   Special Requests IN PEDIATRIC BOTTLE 2CC  Final   Culture NO GROWTH 4 DAYS  Final   Report Status PENDING  Incomplete  Culture, blood (  routine x 2)     Status: None (Preliminary result)   Collection Time: 02/24/16 11:57 AM  Result Value Ref Range Status   Specimen Description BLOOD RIGHT HAND  Final   Special Requests IN PEDIATRIC BOTTLE 2CC  Final   Culture NO GROWTH 4 DAYS  Final   Report Status PENDING  Incomplete     Time coordinating discharge: Over 30 minutes  SIGNED:   Birdie Hopes, MD  [facility] 02/28/2016, 10:19 AM Pager   If 7PM-7AM, please contact night-coverage [email or web address] Password [email or web address]

## 2016-02-29 LAB — CULTURE, BLOOD (ROUTINE X 2)
Culture: NO GROWTH
Culture: NO GROWTH

## 2016-03-02 DIAGNOSIS — R7881 Bacteremia: Secondary | ICD-10-CM | POA: Diagnosis not present

## 2016-03-02 DIAGNOSIS — B9689 Other specified bacterial agents as the cause of diseases classified elsewhere: Secondary | ICD-10-CM | POA: Diagnosis not present

## 2016-03-02 DIAGNOSIS — I330 Acute and subacute infective endocarditis: Secondary | ICD-10-CM | POA: Diagnosis not present

## 2016-03-02 DIAGNOSIS — M009 Pyogenic arthritis, unspecified: Secondary | ICD-10-CM | POA: Diagnosis not present

## 2016-03-02 DIAGNOSIS — I2690 Septic pulmonary embolism without acute cor pulmonale: Secondary | ICD-10-CM | POA: Diagnosis not present

## 2016-03-15 ENCOUNTER — Other Ambulatory Visit: Payer: Self-pay | Admitting: Thoracic Surgery (Cardiothoracic Vascular Surgery)

## 2016-03-15 DIAGNOSIS — I079 Rheumatic tricuspid valve disease, unspecified: Secondary | ICD-10-CM

## 2016-03-15 DIAGNOSIS — I368 Other nonrheumatic tricuspid valve disorders: Principal | ICD-10-CM

## 2016-03-16 ENCOUNTER — Ambulatory Visit: Payer: Medicare Other | Admitting: Thoracic Surgery (Cardiothoracic Vascular Surgery)

## 2016-03-17 ENCOUNTER — Encounter (HOSPITAL_COMMUNITY): Payer: Self-pay | Admitting: Emergency Medicine

## 2016-03-17 ENCOUNTER — Emergency Department (HOSPITAL_COMMUNITY): Payer: Medicare Other

## 2016-03-17 ENCOUNTER — Inpatient Hospital Stay (HOSPITAL_COMMUNITY)
Admission: EM | Admit: 2016-03-17 | Discharge: 2016-03-25 | DRG: 862 | Disposition: A | Discharge status: Home / Self Care | Payer: Medicare Other | Attending: Internal Medicine | Admitting: Internal Medicine

## 2016-03-17 DIAGNOSIS — T148XXA Other injury of unspecified body region, initial encounter: Secondary | ICD-10-CM

## 2016-03-17 DIAGNOSIS — F329 Major depressive disorder, single episode, unspecified: Secondary | ICD-10-CM | POA: Diagnosis present

## 2016-03-17 DIAGNOSIS — Z809 Family history of malignant neoplasm, unspecified: Secondary | ICD-10-CM

## 2016-03-17 DIAGNOSIS — B1920 Unspecified viral hepatitis C without hepatic coma: Secondary | ICD-10-CM | POA: Diagnosis present

## 2016-03-17 DIAGNOSIS — Z885 Allergy status to narcotic agent status: Secondary | ICD-10-CM

## 2016-03-17 DIAGNOSIS — Z886 Allergy status to analgesic agent status: Secondary | ICD-10-CM

## 2016-03-17 DIAGNOSIS — F1410 Cocaine abuse, uncomplicated: Secondary | ICD-10-CM | POA: Diagnosis present

## 2016-03-17 DIAGNOSIS — M009 Pyogenic arthritis, unspecified: Secondary | ICD-10-CM | POA: Diagnosis present

## 2016-03-17 DIAGNOSIS — B9689 Other specified bacterial agents as the cause of diseases classified elsewhere: Secondary | ICD-10-CM

## 2016-03-17 DIAGNOSIS — Z8261 Family history of arthritis: Secondary | ICD-10-CM

## 2016-03-17 DIAGNOSIS — B965 Pseudomonas (aeruginosa) (mallei) (pseudomallei) as the cause of diseases classified elsewhere: Secondary | ICD-10-CM | POA: Diagnosis present

## 2016-03-17 DIAGNOSIS — F419 Anxiety disorder, unspecified: Secondary | ICD-10-CM

## 2016-03-17 DIAGNOSIS — F32A Depression, unspecified: Secondary | ICD-10-CM | POA: Diagnosis present

## 2016-03-17 DIAGNOSIS — M868X8 Other osteomyelitis, other site: Secondary | ICD-10-CM | POA: Diagnosis not present

## 2016-03-17 DIAGNOSIS — F1110 Opioid abuse, uncomplicated: Secondary | ICD-10-CM | POA: Diagnosis present

## 2016-03-17 DIAGNOSIS — B9562 Methicillin resistant Staphylococcus aureus infection as the cause of diseases classified elsewhere: Secondary | ICD-10-CM | POA: Diagnosis present

## 2016-03-17 DIAGNOSIS — Z8619 Personal history of other infectious and parasitic diseases: Secondary | ICD-10-CM | POA: Diagnosis not present

## 2016-03-17 DIAGNOSIS — B958 Unspecified staphylococcus as the cause of diseases classified elsewhere: Secondary | ICD-10-CM | POA: Diagnosis not present

## 2016-03-17 DIAGNOSIS — Z8249 Family history of ischemic heart disease and other diseases of the circulatory system: Secondary | ICD-10-CM | POA: Diagnosis not present

## 2016-03-17 DIAGNOSIS — Z884 Allergy status to anesthetic agent status: Secondary | ICD-10-CM

## 2016-03-17 DIAGNOSIS — L089 Local infection of the skin and subcutaneous tissue, unspecified: Secondary | ICD-10-CM | POA: Diagnosis present

## 2016-03-17 DIAGNOSIS — T814XXA Infection following a procedure, initial encounter: Principal | ICD-10-CM | POA: Diagnosis present

## 2016-03-17 DIAGNOSIS — Z833 Family history of diabetes mellitus: Secondary | ICD-10-CM | POA: Diagnosis not present

## 2016-03-17 DIAGNOSIS — R0789 Other chest pain: Secondary | ICD-10-CM | POA: Diagnosis not present

## 2016-03-17 DIAGNOSIS — Z9119 Patient's noncompliance with other medical treatment and regimen: Secondary | ICD-10-CM | POA: Diagnosis not present

## 2016-03-17 DIAGNOSIS — M00811 Arthritis due to other bacteria, right shoulder: Secondary | ICD-10-CM | POA: Diagnosis not present

## 2016-03-17 DIAGNOSIS — R7881 Bacteremia: Secondary | ICD-10-CM | POA: Diagnosis present

## 2016-03-17 DIAGNOSIS — Z825 Family history of asthma and other chronic lower respiratory diseases: Secondary | ICD-10-CM | POA: Diagnosis not present

## 2016-03-17 DIAGNOSIS — F1910 Other psychoactive substance abuse, uncomplicated: Secondary | ICD-10-CM | POA: Diagnosis present

## 2016-03-17 DIAGNOSIS — I330 Acute and subacute infective endocarditis: Secondary | ICD-10-CM | POA: Diagnosis not present

## 2016-03-17 DIAGNOSIS — I368 Other nonrheumatic tricuspid valve disorders: Secondary | ICD-10-CM | POA: Diagnosis not present

## 2016-03-17 DIAGNOSIS — R05 Cough: Secondary | ICD-10-CM | POA: Diagnosis not present

## 2016-03-17 DIAGNOSIS — J449 Chronic obstructive pulmonary disease, unspecified: Secondary | ICD-10-CM | POA: Diagnosis present

## 2016-03-17 DIAGNOSIS — D649 Anemia, unspecified: Secondary | ICD-10-CM | POA: Diagnosis not present

## 2016-03-17 DIAGNOSIS — O99320 Drug use complicating pregnancy, unspecified trimester: Secondary | ICD-10-CM | POA: Diagnosis present

## 2016-03-17 DIAGNOSIS — M8618 Other acute osteomyelitis, other site: Secondary | ICD-10-CM | POA: Diagnosis not present

## 2016-03-17 DIAGNOSIS — M869 Osteomyelitis, unspecified: Secondary | ICD-10-CM | POA: Diagnosis present

## 2016-03-17 DIAGNOSIS — B182 Chronic viral hepatitis C: Secondary | ICD-10-CM | POA: Diagnosis not present

## 2016-03-17 DIAGNOSIS — R079 Chest pain, unspecified: Secondary | ICD-10-CM | POA: Insufficient documentation

## 2016-03-17 DIAGNOSIS — I2690 Septic pulmonary embolism without acute cor pulmonale: Secondary | ICD-10-CM | POA: Diagnosis present

## 2016-03-17 DIAGNOSIS — I079 Rheumatic tricuspid valve disease, unspecified: Secondary | ICD-10-CM | POA: Diagnosis present

## 2016-03-17 DIAGNOSIS — F17210 Nicotine dependence, cigarettes, uncomplicated: Secondary | ICD-10-CM | POA: Diagnosis not present

## 2016-03-17 DIAGNOSIS — Z836 Family history of other diseases of the respiratory system: Secondary | ICD-10-CM

## 2016-03-17 DIAGNOSIS — Z807 Family history of other malignant neoplasms of lymphoid, hematopoietic and related tissues: Secondary | ICD-10-CM

## 2016-03-17 DIAGNOSIS — Z91018 Allergy to other foods: Secondary | ICD-10-CM

## 2016-03-17 DIAGNOSIS — E876 Hypokalemia: Secondary | ICD-10-CM | POA: Diagnosis not present

## 2016-03-17 DIAGNOSIS — Z881 Allergy status to other antibiotic agents status: Secondary | ICD-10-CM

## 2016-03-17 LAB — COMPREHENSIVE METABOLIC PANEL
ALT: 16 U/L (ref 14–54)
AST: 13 U/L — ABNORMAL LOW (ref 15–41)
Albumin: 3.1 g/dL — ABNORMAL LOW (ref 3.5–5.0)
Alkaline Phosphatase: 56 U/L (ref 38–126)
Anion gap: 9 (ref 5–15)
BUN: 8 mg/dL (ref 6–20)
CO2: 21 mmol/L — ABNORMAL LOW (ref 22–32)
Calcium: 8.9 mg/dL (ref 8.9–10.3)
Chloride: 104 mmol/L (ref 101–111)
Creatinine, Ser: 0.67 mg/dL (ref 0.44–1.00)
GFR calc Af Amer: >60 mL/min (ref >60)
GFR calc non Af Amer: >60 mL/min (ref >60)
Glucose, Bld: 122 mg/dL — ABNORMAL HIGH (ref 65–99)
Potassium: 3.4 mmol/L — ABNORMAL LOW (ref 3.5–5.1)
Sodium: 134 mmol/L — ABNORMAL LOW (ref 135–145)
Total Bilirubin: 0.1 mg/dL — ABNORMAL LOW (ref 0.3–1.2)
Total Protein: 6.9 g/dL (ref 6.5–8.1)

## 2016-03-17 LAB — URINALYSIS, ROUTINE W REFLEX MICROSCOPIC
Bacteria, UA: NONE SEEN
Bilirubin Urine: NEGATIVE
Glucose, UA: NEGATIVE mg/dL
Hgb urine dipstick: NEGATIVE
Ketones, ur: NEGATIVE mg/dL
Nitrite: NEGATIVE
Protein, ur: NEGATIVE mg/dL
Specific Gravity, Urine: 1.012 (ref 1.005–1.030)
pH: 6 (ref 5.0–8.0)

## 2016-03-17 LAB — CBC WITH DIFFERENTIAL/PLATELET
Basophils Absolute: 0 10*3/uL (ref 0.0–0.1)
Basophils Relative: 0 %
Eosinophils Absolute: 0.2 10*3/uL (ref 0.0–0.7)
Eosinophils Relative: 1 %
HCT: 27.7 % — ABNORMAL LOW (ref 36.0–46.0)
Hemoglobin: 9.1 g/dL — ABNORMAL LOW (ref 12.0–15.0)
Lymphocytes Relative: 23 %
Lymphs Abs: 3.2 10*3/uL (ref 0.7–4.0)
MCH: 27.2 pg (ref 26.0–34.0)
MCHC: 32.9 g/dL (ref 30.0–36.0)
MCV: 82.7 fL (ref 78.0–100.0)
Monocytes Absolute: 1 10*3/uL (ref 0.1–1.0)
Monocytes Relative: 7 %
Neutro Abs: 9.7 10*3/uL — ABNORMAL HIGH (ref 1.7–7.7)
Neutrophils Relative %: 69 %
Platelets: 181 10*3/uL (ref 150–400)
RBC: 3.35 MIL/uL — ABNORMAL LOW (ref 3.87–5.11)
RDW: 19.8 % — ABNORMAL HIGH (ref 11.5–15.5)
WBC: 14.1 10*3/uL — ABNORMAL HIGH (ref 4.0–10.5)

## 2016-03-17 LAB — PROTIME-INR
INR: 1.19
Prothrombin Time: 15.2 seconds (ref 11.4–15.2)

## 2016-03-17 LAB — I-STAT CG4 LACTIC ACID, ED
Lactic Acid, Venous: 0.45 mmol/L — ABNORMAL LOW (ref 0.5–1.9)
Lactic Acid, Venous: 1.31 mmol/L (ref 0.5–1.9)

## 2016-03-17 MED ORDER — POLYETHYLENE GLYCOL 3350 17 G PO PACK: 17.0000 g | PACK | Freq: Every day | ORAL | Status: DC | PRN

## 2016-03-17 MED ORDER — TRAZODONE HCL 50 MG PO TABS: 25.0000 mg | ORAL_TABLET | Freq: Every evening | ORAL | Status: DC | PRN

## 2016-03-17 MED ORDER — POTASSIUM CHLORIDE CRYS ER 20 MEQ PO TBCR
40.0000 meq | EXTENDED_RELEASE_TABLET | Freq: Once | ORAL | Status: AC
  Administered 2016-03-17: 40 meq via ORAL
  Filled 2016-03-17: qty 2

## 2016-03-17 MED ORDER — SENNOSIDES-DOCUSATE SODIUM 8.6-50 MG PO TABS: 1.0000 | ORAL_TABLET | Freq: Every evening | ORAL | Status: DC | PRN

## 2016-03-17 MED ORDER — ONDANSETRON HCL 4 MG/2ML IJ SOLN: 4.0000 mg | Freq: Four times a day (QID) | INTRAMUSCULAR | Status: DC | PRN

## 2016-03-17 MED ORDER — VANCOMYCIN HCL IN DEXTROSE 1-5 GM/200ML-% IV SOLN
1000.0000 mg | Freq: Three times a day (TID) | INTRAVENOUS | Status: DC
  Administered 2016-03-17 – 2016-03-25 (×23): 1000 mg via INTRAVENOUS
  Filled 2016-03-17 (×25): qty 200

## 2016-03-17 MED ORDER — SODIUM CHLORIDE 0.9 % IV SOLN
INTRAVENOUS | Status: DC
  Administered 2016-03-17: 14:00:00 via INTRAVENOUS
  Administered 2016-03-18 – 2016-03-19 (×2): 1000 mL via INTRAVENOUS
  Administered 2016-03-20: 15:00:00 via INTRAVENOUS

## 2016-03-17 MED ORDER — DEXTROSE 5 % IV SOLN
2.0000 g | Freq: Two times a day (BID) | INTRAVENOUS | Status: DC
  Administered 2016-03-17 – 2016-03-24 (×16): 2 g via INTRAVENOUS
  Filled 2016-03-17 (×18): qty 2

## 2016-03-17 MED ORDER — HEPARIN SODIUM (PORCINE) 5000 UNIT/ML IJ SOLN
5000.0000 [IU] | Freq: Three times a day (TID) | INTRAMUSCULAR | Status: DC
  Administered 2016-03-17 – 2016-03-24 (×20): 5000 [IU] via SUBCUTANEOUS
  Filled 2016-03-17 (×21): qty 1

## 2016-03-17 MED ORDER — MAGNESIUM CITRATE PO SOLN: 1.0000 | Freq: Once | ORAL | Status: DC | PRN

## 2016-03-17 MED ORDER — OXYCODONE-ACETAMINOPHEN 5-325 MG PO TABS
1.0000 | ORAL_TABLET | ORAL | Status: DC | PRN
  Administered 2016-03-17 – 2016-03-25 (×31): 2 via ORAL
  Filled 2016-03-17 (×34): qty 2

## 2016-03-17 MED ORDER — VANCOMYCIN HCL 10 G IV SOLR
1500.0000 mg | Freq: Once | INTRAVENOUS | Status: AC
  Administered 2016-03-17: 1500 mg via INTRAVENOUS
  Filled 2016-03-17 (×2): qty 1500

## 2016-03-17 MED ORDER — ACETAMINOPHEN 325 MG PO TABS: 650.0000 mg | ORAL_TABLET | Freq: Four times a day (QID) | ORAL | Status: DC | PRN

## 2016-03-17 MED ORDER — ONDANSETRON HCL 4 MG PO TABS
4.0000 mg | ORAL_TABLET | Freq: Four times a day (QID) | ORAL | Status: DC | PRN
  Administered 2016-03-22 – 2016-03-25 (×2): 4 mg via ORAL
  Filled 2016-03-17 (×2): qty 1

## 2016-03-17 MED ORDER — SODIUM CHLORIDE 0.9% FLUSH
3.0000 mL | Freq: Two times a day (BID) | INTRAVENOUS | Status: DC
  Administered 2016-03-17 – 2016-03-24 (×9): 3 mL via INTRAVENOUS

## 2016-03-17 MED ORDER — ACETAMINOPHEN 650 MG RE SUPP: 650.0000 mg | Freq: Four times a day (QID) | RECTAL | Status: DC | PRN

## 2016-03-17 MED ORDER — BISACODYL 10 MG RE SUPP: 10.0000 mg | Freq: Every day | RECTAL | Status: DC | PRN

## 2016-03-17 NOTE — ED Provider Notes (Signed)
[facility] DEPT Provider Note   CSN: [id number] Arrival date & time: 03/17/16  [id number]     History   Chief Complaint Chief Complaint  Patient presents with  . Wound Infection    HPI [name] is a 45 y.o. female.Complains of chest pain right-sided parasternal onset 2 days ago. Pain is constant. Not made better or worse by anything. She denies any fever. Patient had sternoclavicular abscess incised and drained 02/22/2016 with subsequent wound VAC placed November 14. She was discharged from here November 18. 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 She last injected heroin into her right hand 2 days ago. She reports they removed the wound VAC prior to her being discharged, PICC line has been left in place.. No other associated symptoms. She's had no antibiotics since leaving the nursing home.  HPI  Past Medical History:  Diagnosis Date  . Anxiety   . Chronic back pain   . Chronic knee pain   . COPD (chronic obstructive pulmonary disease) ([place])   . DDD (degenerative disc disease)   . Hepatitis C infection 07/13/2014  . IV drug abuse   . Nephrolithiasis   . Panic attacks   . Polysubstance abuse    cocaine, opiates, marijuana, amphetamines, "molly," crystal meth  . Pulmonary embolism [facility])     Patient Active Problem List   Diagnosis Date Noted  . 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 Anxiety 02/22/2016  . Cigarette smoker 02/22/2016  . Abscess of chest ([place]) 02/21/2016  . Opioid abuse 02/21/2016  . Pulmonary embolism, septic ([place]) 02/21/2016  . Septic arthritis of right sternoclavicular joint ([place]) 02/21/2016  . COPD (chronic obstructive pulmonary disease) ([place]) 10/03/2014  . Hepatitis C infection 07/13/2014  . IV drug abuse   . Depression 06/17/2014  . Polysubstance abuse 06/16/2014    Class: Chronic  . Obesity 06/06/2012     Past Surgical History:  Procedure Laterality Date  . CESAREAN SECTION    . NOSE SURGERY    . STERNAL WOUND DEBRIDEMENT N/A 02/22/2016   Procedure: IRRIGATION AND DEBRIDEMENT MANUBRIUM ABSCESS with wound cultures;  Surgeon: Melrose Nakayama, MD;  Location: [facility];  Service: Thoracic;  Laterality: N/A;  . STERNAL WOUND DEBRIDEMENT N/A 02/23/2016   Procedure: DRESSING CHANGE UNDER ANESTHESIA  WITH WOUND VAC PLACEMENT;  Surgeon: Melrose Nakayama, MD;  Location: [facility];  Service: Thoracic;  Laterality: N/A;  . TEE WITHOUT CARDIOVERSION N/A 02/23/2016   Procedure: TRANSESOPHAGEAL ECHOCARDIOGRAM (TEE);  Surgeon: Jerline Pain, MD;  Location: [facility] ENDOSCOPY;  Service: Cardiovascular;  Laterality: N/A;  . tubes tied      OB History    Gravida Para Term Preterm AB Living   2 2 2     2    SAB TAB Ectopic Multiple Live Births                   Home Medications    Prior to Admission medications   Medication Sig Start Date End Date Taking? Authorizing Provider  ceFEPIme 2 g in dextrose 5 % 50 mL Inject 2 g into the vein every 12 (twelve) hours. 02/28/16 04/05/16  Verlee Monte, MD  oxyCODONE-acetaminophen (PERCOCET/ROXICET) 5-325 MG tablet Take 1-2 tablets by mouth every 4 (four) hours as needed for moderate pain. 02/28/16   Verlee Monte, MD  polyethylene glycol (MIRALAX / GLYCOLAX) packet Take 17  g by mouth daily as needed for mild constipation. 02/28/16   Verlee Monte, MD    Family History Family History  Problem Relation Age of Onset  . COPD Mother   . Hypertension Father   . Heart attack Father   . Diabetes Father   . Arthritis    . Lung disease    . Cancer    . Asthma      Social History Social History  Substance Use Topics  . Smoking status: Current Every Day Smoker    Packs/day: 0.50    Years: 25.00    Types: Cigarettes  . Smokeless tobacco: Never Used  . Alcohol use No   IV drug use  Allergies   Doxycycline; Mango flavor; Pyridium [phenazopyridine hcl]; and  Tramadol   Review of Systems Review of Systems  Constitutional: Negative.   HENT: Negative.   Respiratory: Negative.   Cardiovascular: Positive for chest pain.  Gastrointestinal: Negative.   Musculoskeletal: Negative.   Skin: Positive for wound.  Neurological: Negative.   Psychiatric/Behavioral: Negative.   All other systems reviewed and are negative.    Physical Exam Updated Vital Signs BP 121/75 (BP Location: Right Arm)   Pulse 84   Temp 97.9 F (36.6 C) (Oral)   Resp 17   Ht 5\' 1"  (1.549 m)   Wt 188 lb (85.3 kg)   LMP 01/21/2016 (Approximate) Comment: tubal ligation per patient.shielded  SpO2 99%   BMI 35.52 kg/m   Physical Exam  Constitutional: She appears well-developed and well-nourished.  HENT:  Head: Normocephalic and atraumatic.  Eyes: Conjunctivae are normal. Pupils are equal, round, and reactive to light.  Neck: Neck supple. No tracheal deviation present. No thyromegaly present.  Cardiovascular: Normal rate and regular rhythm.   No murmur heard. Pulmonary/Chest: Effort normal and breath sounds normal.  Abdominal: Soft. Bowel sounds are normal. She exhibits no distension. There is no tenderness.  Musculoskeletal: Normal range of motion. She exhibits no edema or tenderness.  Neurological: She is alert. Coordination normal.  Skin: Skin is warm and dry. No rash noted.  Open wound, clean-appearing to right anterior chest overlying sternoclavicular joint. No drainage. PICC line in place at left upper extremity insertion site is clean, not red or warm or tender.  Psychiatric: She has a normal mood and affect.  Nursing note and vitals reviewed.    ED Treatments / Results  Labs (all labs ordered are listed, but only abnormal results are displayed) Labs Reviewed  URINE CULTURE  COMPREHENSIVE METABOLIC PANEL  CBC WITH DIFFERENTIAL/PLATELET  PROTIME-INR  URINALYSIS, ROUTINE W REFLEX MICROSCOPIC  I-STAT CG4 LACTIC ACID, ED    EKG  EKG  Interpretation  Date/Time:  Wednesday March 17 2016 10:55:45 EST Ventricular Rate:  82 PR Interval:    QRS Duration: 86 QT Interval:  362 QTC Calculation: 423 R Axis:   74 Text Interpretation:  Sinus rhythm No significant change since last tracing Confirmed by Winfred Leeds  MD, Aizik Reh [phone]) on 03/17/2016 11:12:27 AM Also confirmed by Winfred Leeds  MD, Ezekiah Massie [phone]), editor [place] CT, Leda Gauze [phone])  on 03/17/2016 11:14:54 AM      chestx-ray viewed by me Results for orders placed or performed during the hospital encounter of 03/17/16  Comprehensive metabolic panel  Result Value Ref Range   Sodium 134 (L) 135 - 145 mmol/L   Potassium 3.4 (L) 3.5 - 5.1 mmol/L   Chloride 104 101 - 111 mmol/L   CO2 21 (L) 22 - 32 mmol/L   Glucose, Bld 122 (H)  65 - 99 mg/dL   BUN 8 6 - 20 mg/dL   Creatinine, Ser 0.67 0.44 - 1.00 mg/dL   Calcium 8.9 8.9 - 10.3 mg/dL   Total Protein 6.9 6.5 - 8.1 g/dL   Albumin 3.1 (L) 3.5 - 5.0 g/dL   AST 13 (L) 15 - 41 U/L   ALT 16 14 - 54 U/L   Alkaline Phosphatase 56 38 - 126 U/L   Total Bilirubin 0.1 (L) 0.3 - 1.2 mg/dL   GFR calc non Af Amer >60 >60 mL/min   GFR calc Af Amer >60 >60 mL/min   Anion gap 9 5 - 15  CBC with Differential  Result Value Ref Range   WBC 14.1 (H) 4.0 - 10.5 K/uL   RBC 3.35 (L) 3.87 - 5.11 MIL/uL   Hemoglobin 9.1 (L) 12.0 - 15.0 g/dL   HCT 27.7 (L) 36.0 - 46.0 %   MCV 82.7 78.0 - 100.0 fL   MCH 27.2 26.0 - 34.0 pg   MCHC 32.9 30.0 - 36.0 g/dL   RDW 19.8 (H) 11.5 - 15.5 %   Platelets 181 150 - 400 K/uL   Neutrophils Relative % 69 %   Neutro Abs 9.7 (H) 1.7 - 7.7 K/uL   Lymphocytes Relative 23 %   Lymphs Abs 3.2 0.7 - 4.0 K/uL   Monocytes Relative 7 %   Monocytes Absolute 1.0 0.1 - 1.0 K/uL   Eosinophils Relative 1 %   Eosinophils Absolute 0.2 0.0 - 0.7 K/uL   Basophils Relative 0 %   Basophils Absolute 0.0 0.0 - 0.1 K/uL  Protime-INR  Result Value Ref Range   Prothrombin Time 15.2 11.4 - 15.2 seconds   INR 1.19   Urinalysis,  Routine w reflex microscopic  Result Value Ref Range   Color, Urine YELLOW YELLOW   APPearance HAZY (A) CLEAR   Specific Gravity, Urine 1.012 1.005 - 1.030   pH 6.0 5.0 - 8.0   Glucose, UA NEGATIVE NEGATIVE mg/dL   Hgb urine dipstick NEGATIVE NEGATIVE   Bilirubin Urine NEGATIVE NEGATIVE   Ketones, ur NEGATIVE NEGATIVE mg/dL   Protein, ur NEGATIVE NEGATIVE mg/dL   Nitrite NEGATIVE NEGATIVE   Leukocytes, UA SMALL (A) NEGATIVE   RBC / HPF 0-5 0 - 5 RBC/hpf   WBC, UA 0-5 0 - 5 WBC/hpf   Bacteria, UA NONE SEEN NONE SEEN   Squamous Epithelial / LPF 0-5 (A) NONE SEEN   Mucous PRESENT   I-Stat CG4 Lactic Acid, ED  Result Value Ref Range   Lactic Acid, Venous 0.45 (L) 0.5 - 1.9 mmol/L   Ct Chest Wo Contrast  Result Date: 02/21/2016 CLINICAL DATA:  45 year old female with chest growth and pain. IV drug abuse. EXAM: CT CHEST WITHOUT CONTRAST TECHNIQUE: Multidetector CT imaging of the chest was performed following the standard protocol without IV contrast. COMPARISON:  10/08/2005 chest CT.  05/03/2015 chest radiograph. FINDINGS: Cardiovascular: Normal heart size. No significant pericardial fluid/thickening. Great vessels are normal in course and caliber. Mediastinum/Nodes: No discrete thyroid nodules. Unremarkable esophagus. Mild right axillary adenopathy measuring up to 1.1 cm (series 3/image 60), new since 10/08/2005. No left axillary adenopathy. Right supraclavicular adenopathy measuring up to 1.1 cm (series 3/image 28), new. Anterior right mediastinal prevascular adenopathy measuring up to 1.0 cm (series 3/ image 52), new. No additional pathologically enlarged mediastinal or gross hilar nodes on this noncontrast study. Lungs/Pleura: No pneumothorax. No pleural effusion. Poorly marginated 1.7 x 1.0 cm peripheral basilar right upper lobe pulmonary nodule (series  4/ image 77) with ground-glass halo, new since 2007. Poorly marginated 6 mm solid right lower lobe pulmonary nodule (series 4/ image 118),  new. Apical left upper lobe 2 mm solid pulmonary nodule (series 4/ image 37) is stable since 2007 and considered benign. No additional significant pulmonary nodules. Upper abdomen: Unremarkable. Musculoskeletal: There is extensive erosive change involving the right sternoclavicular and anterior right first costo manubrial joints with associated large joint effusion causing local mass effect in the right anterior upper mediastinum. The there is an overlying 3.4 x 2.3 x 3.1 cm abscess in the medial ventral right upper chest wall involving the deep subcutaneous soft tissues and medial right pectoral muscle (series 3/ image 37). IMPRESSION: 1. Severe septic arthritis of the right sternoclavicular and right first costomanubrial joints with associated large joint effusion. Orthopedic surgery consultation advised. 2. Overlying 3.4 x 2.3 x 3.1 cm abscess in the medial ventral upper right chest wall involving the deep subcutaneous soft tissues and medial right pectoral muscle. 3. Poorly marginated 1.7 cm basilar right upper lobe and 0.6 cm right lower lobe pulmonary nodules, new since 2007, suspicious for septic emboli given the history of IV drug abuse. Follow-up post treatment chest CT is recommended in 3 months. 4. Mild right axillary, right supraclavicular and anterior right upper mediastinal lymphadenopathy, nonspecific, probably reactive. These nodes can also be reassessed on follow-up chest CT in 3 months. Electronically Signed   By: Ilona Sorrel M.D.   On: 02/21/2016 13:41   US Renal  Result Date: 02/24/2016 CLINICAL DATA:  Acute renal injury EXAM: RENAL / URINARY TRACT ULTRASOUND COMPLETE COMPARISON:  CT abdomen and pelvis December 18, 2013 FINDINGS: Right Kidney: Length: 14.6 cm. Echogenicity is increased. Renal cortical thickness is normal. No mass, perinephric fluid, or hydronephrosis visualized. No sonographically demonstrable calculus or ureterectasis. Left Kidney: Length: 13.7 cm. Echogenicity is  increased. Renal cortical thickness is normal. No mass or hydronephrosis visualized. There is mild perinephric fluid on the left. No sonographically demonstrable calculus or ureterectasis. Bladder: Appears normal for degree of bladder distention. IMPRESSION: Mild perinephric fluid on the left, a finding of uncertain etiology. This finding could be indicative of early pyelonephritis on the left. No renal abscess seen. Both kidneys are echogenic, consistent with medical renal disease. No hydronephrosis noted on either side. Electronically Signed   By: Lowella Grip III M.D.   On: 02/24/2016 17:25   Dg Chest Port 1 View  Result Date: 03/17/2016 CLINICAL DATA:  Redness around the wound on central chest. Evaluate PICC line. Shortness breath and cough. EXAM: PORTABLE CHEST 1 VIEW COMPARISON:  05/03/2015.  Chest CT 02/21/2016. FINDINGS: Left PICC line is in place with the tip in the upper right atrium approximately 3 cm below the cavoatrial junction. Heart is normal size. Left lung is clear. Airspace disease in the right upper lobe concerning for pneumonia. No effusions. IMPRESSION: Left PICC line tip in the right atrium 3 cm below the cavoatrial junction. Right upper lobe opacity concerning for pneumonia. Electronically Signed   By: Rolm Baptise M.D.   On: 03/17/2016 12:38   Radiology No results found.  Procedures Procedures (including critical care time)  Medications Ordered in ED Medications - No data to display   Initial Impression / Assessment and Plan / ED Course  I have reviewed the triage vital signs and the nursing notes.  Pertinent labs & imaging results that were available during my care of the patient were reviewed by me and considered in my medical decision making (  see chart for details).  Clinical Course     Thoracic surgery service Dr. Roxan Hockey consulted and will see patient in the hospital later today. He requests admission, CT scan chest with contrast, intravenous  antibiotics . I also consulted with Dr. Johnnye Sima next disease. He advised not to use PICC line. New PICC line to be inserted. Existing PICC line should be removed He recommends vancomycin to be dosed by pharmacy, and cefepime 2 g twice daily. New PICC line and first dose of IV antibiotics can be administered while patient is on the floor, after admission. He will see patient in hospital. Hospitalist service consulted who will arrange for inpatient stay. Oral potassium supplementation ordered. Final Clinical Impressions(s) / ED Diagnoses  Diagnosis #1 chest pain #2 IV drug use #3 infective endocarditis #18medication noncompliance #5 anemia #5 hypokalemia Final diagnoses:  None    New Prescriptions New Prescriptions   No medications on file     Orlie Dakin, MD 03/17/16 1330

## 2016-03-17 NOTE — ED Triage Notes (Signed)
Pt arrives to ED for wound check on her right upper chest that she had a abscess removed in the beginning on November. Pt has PICC line with dirty dressing in her right upper arm. Pt states she signed up self out of a rehab facility 3 days ago.

## 2016-03-17 NOTE — ED Notes (Signed)
IV team and phlebotomy at bedside. 

## 2016-03-17 NOTE — Progress Notes (Signed)
Pharmacy Antibiotic Note  [name] is a 45 y.o. female admitted on 03/17/2016 with endocarditis.  Pharmacy has been consulted for vancomycin dosing. Pt was recently hospitalized for septic arthritis and endocarditis treated with cefepime.   Pt received vancomycin 1500mg  IV once and was started on cefepime in the ED.  Plan: Vancomycin 1g IV every 8 hours.  Goal trough 15-20 mcg/mL.  Continue cefepime 2g IV q12h Monitor culture data, renal function and clinical course VT at SS prn  Height: 5\' 1"  (154.9 cm) Weight: 188 lb (85.3 kg) IBW/kg (Calculated) : 47.8  Temp (24hrs), Avg:98.1 F (36.7 C), Min:97.9 F (36.6 C), Max:98.3 F (36.8 C)   Recent Labs Lab 03/17/16 1107 03/17/16 1119 03/17/16 1335  WBC 14.1*  --   --   CREATININE 0.67  --   --   LATICACIDVEN  --  0.45* 1.31    Estimated Creatinine Clearance: 89 mL/min (by C-G formula based on SCr of 0.67 mg/dL).    Allergies  Allergen Reactions  . Doxycycline Other (See Comments)    burns  . Mango Flavor Hives and Swelling  . Pyridium [Phenazopyridine Hcl] Other (See Comments)    Irritation to skin  . Tramadol Nausea And Vomiting    Antimicrobials this admission: Vanc 12/6 >>  Cefepime 12/6 >>   Dose adjustments this admission: n/a  Microbiology results: 12/6 BCx: sent 12/6 UCx: sent   Sputum:    MRSA PCR:    Andrey Cota. Diona Foley, PharmD, [place] Clinical Pharmacist Pager [phone] 03/17/2016 1:53 PM

## 2016-03-17 NOTE — Progress Notes (Signed)
Stat PICC ordered, spoke with ED RN and informed her that PICC would not be placed secondary to patient leaving facility AMA. This is putting patient at high risk for infection secondary to PICC maintenance not being performed. Will continue to monitor.

## 2016-03-17 NOTE — ED Notes (Signed)
Attempted report to 5W. 

## 2016-03-17 NOTE — Consult Note (Signed)
[facility]  Date of Admission:  03/17/2016  Date of Consult:  03/17/2016  Reason for Consult:Sternal Osteomyleitis Referring Physician: Cathleen Fears  Impression/Recommendation Sternal Osteomyelitis IVDA Pseudomonas Wound Cx and Comamonas Bacteremia Resume Cefepime Would not replace PIC CVTS f/u consider repeat chest imaging.  Continue vanco while repeat BCx pending.   Check HEP C  VL and genotype Prev 4.3 million (06-2014)   Check HIV  Thank you so much for this interesting consult,   [name] (pager) [phone] [email or web address]  [name] is an 45 y.o. female.  HPI: 45 yo F with hx of IVDA was adm 11-11 to 11-18 with hx of heroin abuse and hep C. She underwent I & D of her sternum for osteo and was d/c with PIC and VAC to SNF. She was also found to have a small TV vegetation and septic emboli to the lungs.   She left the SNF (they rmoeved her VAC) when she felt she needed to attend to pressing family matters.  She denies manipulating her PIC while out of the hospital. She states she returned to hospital because she wanted to go back to the SNF to get better.   Past Medical History:  Diagnosis Date  . Anxiety   . Chronic back pain   . Chronic knee pain   . COPD (chronic obstructive pulmonary disease) ([place])   . DDD (degenerative disc disease)   . Hepatitis C infection 07/13/2014  . IV drug abuse   . Nephrolithiasis   . Panic attacks   . Polysubstance abuse    cocaine, opiates, marijuana, amphetamines, "molly," crystal meth  . Pulmonary embolism [facility])     Past Surgical History:  Procedure Laterality Date  . CESAREAN SECTION    . NOSE SURGERY    . STERNAL WOUND DEBRIDEMENT N/A 02/22/2016   Procedure: IRRIGATION AND DEBRIDEMENT MANUBRIUM ABSCESS with wound cultures;  Surgeon: Melrose Nakayama, MD;  Location: [place];  Service: Thoracic;  Laterality: N/A;  . STERNAL WOUND DEBRIDEMENT N/A 02/23/2016   Procedure:  DRESSING CHANGE UNDER ANESTHESIA  WITH WOUND VAC PLACEMENT;  Surgeon: Melrose Nakayama, MD;  Location: [place];  Service: Thoracic;  Laterality: N/A;  . TEE WITHOUT CARDIOVERSION N/A 02/23/2016   Procedure: TRANSESOPHAGEAL ECHOCARDIOGRAM (TEE);  Surgeon: Jerline Pain, MD;  Location: [facility] ENDOSCOPY;  Service: Cardiovascular;  Laterality: N/A;  . tubes tied       Allergies  Allergen Reactions  . Doxycycline Other (See Comments)    burns  . Mango Flavor Hives and Swelling  . Pyridium [Phenazopyridine Hcl] Other (See Comments)    Irritation to skin  . Tramadol Nausea And Vomiting    Medications:  Scheduled: . heparin  5,000 Units Subcutaneous Q8H  . sodium chloride flush  3 mL Intravenous Q12H    Abtx:  Anti-infectives    Start     Dose/Rate Route Frequency Ordered Stop   03/17/16 2230  vancomycin (VANCOCIN) IVPB 1000 mg/200 mL premix     1,000 mg 200 mL/hr over 60 Minutes Intravenous Every 8 hours 03/17/16 1416     03/17/16 1430  vancomycin (VANCOCIN) 1,500 mg in sodium chloride 0.9 % 500 mL IVPB     1,500 mg 250 mL/hr over 120 Minutes Intravenous  Once 03/17/16 1353     03/17/16 1330  ceFEPIme (MAXIPIME) 2 g in dextrose 5 % 50 mL IVPB     2 g 100 mL/hr over 30 Minutes Intravenous Every 12 hours 03/17/16  1327        Total days of antibiotics: 1 cefepime/vanco          Social History:  reports that she has been smoking Cigarettes.  She has a 12.50 pack-year smoking history. She has never used smokeless tobacco. She reports that she uses drugs, including IV, Cocaine, Marijuana, and Methamphetamines. She reports that she does not drink alcohol.  Family History  Problem Relation Age of Onset  . COPD Mother   . Hypertension Father   . Heart attack Father   . Diabetes Father   . Arthritis    . Lung disease    . Cancer    . Asthma      General ROS: normal BM, normal urine, peuritic CP+, see HPI.   Blood pressure 114/70, pulse 88, temperature 97.9 F (36.6 C),  temperature source Oral, resp. rate 24, height '5\' 1"'$  (1.549 m), weight 85.3 kg (188 lb), last menstrual period 01/21/2016, SpO2 98 %. General appearance: alert, cooperative and no distress Eyes: negative findings: conjunctivae and sclerae normal and pupils equal, round, reactive to light and accomodation Throat: normal findings: oropharynx pink & moist without lesions or evidence of thrush Neck: no adenopathy and supple, symmetrical, trachea midline Lungs: clear to auscultation bilaterally and large wound over R [place] joint. packed. not healthy appearing.  Heart: regular rate and rhythm Abdomen: normal findings: bowel sounds normal and soft, non-tender Extremities: edema none   Results for orders placed or performed during the hospital encounter of 03/17/16 (from the past 48 hour(s))  Comprehensive metabolic panel     Status: Abnormal   Collection Time: 03/17/16 11:07 AM  Result Value Ref Range   Sodium 134 (L) 135 - 145 mmol/L   Potassium 3.4 (L) 3.5 - 5.1 mmol/L   Chloride 104 101 - 111 mmol/L   CO2 21 (L) 22 - 32 mmol/L   Glucose, Bld 122 (H) 65 - 99 mg/dL   BUN 8 6 - 20 mg/dL   Creatinine, Ser 0.67 0.44 - 1.00 mg/dL   Calcium 8.9 8.9 - 10.3 mg/dL   Total Protein 6.9 6.5 - 8.1 g/dL   Albumin 3.1 (L) 3.5 - 5.0 g/dL   AST 13 (L) 15 - 41 U/L   ALT 16 14 - 54 U/L   Alkaline Phosphatase 56 38 - 126 U/L   Total Bilirubin 0.1 (L) 0.3 - 1.2 mg/dL   GFR calc non Af Amer >60 >60 mL/min   GFR calc Af Amer >60 >60 mL/min    Comment: (NOTE) The eGFR has been calculated using the CKD EPI equation. This calculation has not been validated in all clinical situations. eGFR's persistently <60 mL/min signify possible Chronic Kidney Disease.    Anion gap 9 5 - 15  CBC with Differential     Status: Abnormal   Collection Time: 03/17/16 11:07 AM  Result Value Ref Range   WBC 14.1 (H) 4.0 - 10.5 K/uL   RBC 3.35 (L) 3.87 - 5.11 MIL/uL   Hemoglobin 9.1 (L) 12.0 - 15.0 g/dL   HCT 27.7 (L) 36.0 - 46.0  %   MCV 82.7 78.0 - 100.0 fL   MCH 27.2 26.0 - 34.0 pg   MCHC 32.9 30.0 - 36.0 g/dL   RDW 19.8 (H) 11.5 - 15.5 %   Platelets 181 150 - 400 K/uL   Neutrophils Relative % 69 %   Neutro Abs 9.7 (H) 1.7 - 7.7 K/uL   Lymphocytes Relative 23 %   Lymphs Abs 3.2  0.7 - 4.0 K/uL   Monocytes Relative 7 %   Monocytes Absolute 1.0 0.1 - 1.0 K/uL   Eosinophils Relative 1 %   Eosinophils Absolute 0.2 0.0 - 0.7 K/uL   Basophils Relative 0 %   Basophils Absolute 0.0 0.0 - 0.1 K/uL  Protime-INR     Status: None   Collection Time: 03/17/16 11:07 AM  Result Value Ref Range   Prothrombin Time 15.2 11.4 - 15.2 seconds   INR 1.19   I-Stat CG4 Lactic Acid, ED     Status: Abnormal   Collection Time: 03/17/16 11:19 AM  Result Value Ref Range   Lactic Acid, Venous 0.45 (L) 0.5 - 1.9 mmol/L  Urinalysis, Routine w reflex microscopic     Status: Abnormal   Collection Time: 03/17/16 12:38 PM  Result Value Ref Range   Color, Urine YELLOW YELLOW   APPearance HAZY (A) CLEAR   Specific Gravity, Urine 1.012 1.005 - 1.030   pH 6.0 5.0 - 8.0   Glucose, UA NEGATIVE NEGATIVE mg/dL   Hgb urine dipstick NEGATIVE NEGATIVE   Bilirubin Urine NEGATIVE NEGATIVE   Ketones, ur NEGATIVE NEGATIVE mg/dL   Protein, ur NEGATIVE NEGATIVE mg/dL   Nitrite NEGATIVE NEGATIVE   Leukocytes, UA SMALL (A) NEGATIVE   RBC / HPF 0-5 0 - 5 RBC/hpf   WBC, UA 0-5 0 - 5 WBC/hpf   Bacteria, UA NONE SEEN NONE SEEN   Squamous Epithelial / LPF 0-5 (A) NONE SEEN   Mucous PRESENT   I-Stat CG4 Lactic Acid, ED     Status: None   Collection Time: 03/17/16  1:35 PM  Result Value Ref Range   Lactic Acid, Venous 1.31 0.5 - 1.9 mmol/L      Component Value Date/Time   SDES BLOOD RIGHT HAND 02/24/2016 1157   SPECREQUEST IN PEDIATRIC BOTTLE 2CC 02/24/2016 1157   CULT NO GROWTH 5 DAYS 02/24/2016 1157   REPTSTATUS 02/29/2016 FINAL 02/24/2016 1157   Dg Chest Port 1 View  Result Date: 03/17/2016 CLINICAL DATA:  Pt c/o cough and SOB x 1 week. Pt  has a wound vac in the center of her chest. MD states PICC line is going to come out. Hx of COPD, PE, AND hepatitis C. Pt is a current smoker. EXAM: PORTABLE CHEST 1 VIEW COMPARISON:  Earlier today at 1233 hours FINDINGS: 1326 hours. Interval removal of left-sided PICC line. Numerous leads and wires project over the chest. Midline trachea. Normal heart size. Remote posterior lateral left sixth rib trauma. Improvement in right upper lobe airspace disease. IMPRESSION: Improvement in right upper lobe airspace disease, which remains suspicious for pneumonia. Removal of left PICC line. Electronically Signed   By: Abigail Miyamoto M.D.   On: 03/17/2016 13:35   Dg Chest Port 1 View  Result Date: 03/17/2016 CLINICAL DATA:  Redness around the wound on central chest. Evaluate PICC line. Shortness breath and cough. EXAM: PORTABLE CHEST 1 VIEW COMPARISON:  05/03/2015.  Chest CT 02/21/2016. FINDINGS: Left PICC line is in place with the tip in the upper right atrium approximately 3 cm below the cavoatrial junction. Heart is normal size. Left lung is clear. Airspace disease in the right upper lobe concerning for pneumonia. No effusions. IMPRESSION: Left PICC line tip in the right atrium 3 cm below the cavoatrial junction. Right upper lobe opacity concerning for pneumonia. Electronically Signed   By: Rolm Baptise M.D.   On: 03/17/2016 12:38   No results found for this or any previous visit (from the  past 240 hour(s)).    03/17/2016, 6:26 PM     LOS: 0 days    Records and images were personally reviewed where available.

## 2016-03-17 NOTE — ED Notes (Signed)
Pt has redness around wound on her central chest and has a picc line in her right arm she also wants to have evaluated.

## 2016-03-17 NOTE — ED Notes (Signed)
PT did not have to urinate at this time 

## 2016-03-17 NOTE — ED Notes (Signed)
Pt up to [facility] to get ua spec, asking for pain meds, IV team here to remove PIC line and place peripheral iv, IV team will request new PIC liner insertion, Dr Lenna Sciara spoke admit dr

## 2016-03-17 NOTE — H&P (Signed)
History and Physical  [name] [id number] DOB: 26-May-1970 DOA: 03/17/2016  Referring physician: Dr Venora Maples, ED physician PCP: [name] Patient  Outpatient Specialists: none  Chief Complaint: woun infection and chest pain.   HPI: [name] is a 45 y.o. female with a history of IV drug use (primarily heroin), hepatitis C recently admitted from 11/11-11/18  For septic arthritis and endocarditis of the R sternoclavicular joint with sternoclavicular abscess incised and drained on 02/22/2016, subsequent VAC placed on November 14. She was discharged from here November 18 to [place] home   D/c grew pseudomonas and comanomas -ID recommended cefepime 2 g every 12 hours for total of 6 weeks, end date was to be 04/05/2016 but she left the facility 5 days ago AMA, and without antibiotics. She kept the PICC line  Last IV drug use with heroine was 2 days ago. She is now presenting with chest pain, and pain in the dorsal aspect of her hand, suspicious of infection.. Denies any fever or chills night sweats, nausea or vomiting. No diarrhea. No lower extremity swelling. She does complain of chest wall pain, and these drainage from her when the incision on the) sternal area. CVTS and ID  are to see the patient. She is going to have her picc removed as well, in view of possible infection in the current picc line  Emergency Department Course: Current WBC 14.1. LActic acid 0.45    Patient was given vancomycin and Zosyn.   Review of Systems:   Pt denies any fevers, chills, nausea, vomiting, diarrhea, constipation, abdominal pain, shortness of breath, dyspnea on exertion, orthopnea, cough, wheezing, palpitations, headache, vision changes, lightheadedness, dizziness, melena, rectal bleeding.  Review of systems are otherwise negative  Past Medical History:  Diagnosis Date  . Anxiety   . Chronic back pain   . Chronic knee pain   . COPD (chronic obstructive pulmonary disease) ([place])   . DDD  (degenerative disc disease)   . Hepatitis C infection 07/13/2014  . IV drug abuse   . Nephrolithiasis   . Panic attacks   . Polysubstance abuse    cocaine, opiates, marijuana, amphetamines, "molly," crystal meth  . Pulmonary embolism [facility])    Past Surgical History:  Procedure Laterality Date  . CESAREAN SECTION    . NOSE SURGERY    . STERNAL WOUND DEBRIDEMENT N/A 02/22/2016   Procedure: IRRIGATION AND DEBRIDEMENT MANUBRIUM ABSCESS with wound cultures;  Surgeon: Melrose Nakayama, MD;  Location: [place];  Service: Thoracic;  Laterality: N/A;  . STERNAL WOUND DEBRIDEMENT N/A 02/23/2016   Procedure: DRESSING CHANGE UNDER ANESTHESIA  WITH WOUND VAC PLACEMENT;  Surgeon: Melrose Nakayama, MD;  Location: [place];  Service: Thoracic;  Laterality: N/A;  . TEE WITHOUT CARDIOVERSION N/A 02/23/2016   Procedure: TRANSESOPHAGEAL ECHOCARDIOGRAM (TEE);  Surgeon: Jerline Pain, MD;  Location: [facility] ENDOSCOPY;  Service: Cardiovascular;  Laterality: N/A;  . tubes tied     Social History:  reports that she has been smoking Cigarettes.  She has a 12.50 pack-year smoking history. She has never used smokeless tobacco. She reports that she uses drugs, including IV, Cocaine, Marijuana, and Methamphetamines. She reports that she does not drink alcohol. Patient lives at [place]  . Doxycycline Other (See Comments)    burns  . Mango Flavor Hives and Swelling  . Pyridium [Phenazopyridine Hcl] Other (See Comments)    Irritation to skin  . Tramadol Nausea And Vomiting    Family History  Problem Relation Age of Onset  . COPD Mother   . Hypertension Father   . Heart attack Father   . Diabetes Father   . Arthritis    . Lung disease    . Cancer    . Asthma        Prior to Admission medications   Not on File    Physical Exam: BP 121/75 (BP Location: Right Arm)   Pulse 84   Temp 97.9 F (36.6 C) (Oral)   Resp 17   Ht 5\' 1"  (1.549 m)   Wt 85.3 kg (188 lb)   LMP 01/21/2016  (Approximate) Comment: tubal ligation per patient.  SpO2 99%   BMI 35.52 kg/m   General:  Middle-aged Caucasian female. Awake and alert and oriented x3.  Anxious appearing  HEENT: Normocephalic atraumatic.  Right and left ears normal in appearance.  Pupils equal, round, reactive to light. Extraocular muscles are intact. Sclerae anicteric and noninjected.  Moist mucosal membranes. No mucosal lesions.  Neck: Neck supple without lymphadenopathy. No carotid bruits. No masses palpated.  Cardiovascular: Regular rate with normal S1-S2 sounds. No murmurs, rubs, gallops auscultated. No JVD. Open wound, in the right anterior chest at the sternoclavicular joint, without significant amount of drainage. Decline is in place at the left upper extremity with no erythema or tenderness in. Respiratory: Good respiratory effort with no wheezes, rales, rhonchi. Lungs clear to auscultation bilaterally.  No accessory muscle use. Abdomen: Soft, nontender, nondistended. Active bowel sounds. No masses or hepatosplenomegaly  Musculoskeletal: No calf or leg pain. All major joints not erythematous nontender.  No upper or lower joint deformation.  Good ROM.  No contractures  Psychiatric: Intact judgment and insight. Anxious  Neurologic: No focal neurological deficits. Strength is 5/5 and symmetric in upper and lower extremities.  Cranial nerves II through XII are grossly intact.           Labs on Admission: I have personally reviewed following labs and imaging studies  CBC:  Recent Labs Lab 03/17/16 1107  WBC 14.1*  NEUTROABS 9.7*  HGB 9.1*  HCT 27.7*  MCV 82.7  PLT 0000000   Basic Metabolic Panel:  Recent Labs Lab 03/17/16 1107  NA 134*  K 3.4*  CL 104  CO2 21*  GLUCOSE 122*  BUN 8  CREATININE 0.67  CALCIUM 8.9   GFR: Estimated Creatinine Clearance: 89 mL/min (by C-G formula based on SCr of 0.67 mg/dL). Liver Function Tests:  Recent Labs Lab 03/17/16 1107  AST 13*  ALT 16  ALKPHOS 56  BILITOT  0.1*  PROT 6.9  ALBUMIN 3.1*   No results for input(s): LIPASE, AMYLASE in the last 168 hours. No results for input(s): AMMONIA in the last 168 hours. Coagulation Profile:  Recent Labs Lab 03/17/16 1107  INR 1.19   Cardiac Enzymes: No results for input(s): CKTOTAL, CKMB, CKMBINDEX, TROPONINI in the last 168 hours. BNP (last 3 results) No results for input(s): PROBNP in the last 8760 hours. HbA1C: No results for input(s): HGBA1C in the last 72 hours. CBG: No results for input(s): GLUCAP in the last 168 hours. Lipid Profile: No results for input(s): CHOL, HDL, LDLCALC, TRIG, CHOLHDL, LDLDIRECT in the last 72 hours. Thyroid Function Tests: No results for input(s): TSH, T4TOTAL, FREET4, T3FREE, THYROIDAB in the last 72 hours. Anemia Panel: No results for input(s): VITAMINB12, FOLATE, FERRITIN, TIBC, IRON, RETICCTPCT in the last 72 hours. Urine analysis:    Component Value Date/Time   COLORURINE YELLOW 03/17/2016 1238   APPEARANCEUR  HAZY (A) 03/17/2016 1238   LABSPEC 1.012 03/17/2016 1238   PHURINE 6.0 03/17/2016 1238   GLUCOSEU NEGATIVE 03/17/2016 1238   HGBUR NEGATIVE 03/17/2016 [place] 03/17/2016 1238   KETONESUR NEGATIVE 03/17/2016 1238   PROTEINUR NEGATIVE 03/17/2016 1238   UROBILINOGEN 0.2 10/18/2014 0100   NITRITE NEGATIVE 03/17/2016 1238   LEUKOCYTESUR SMALL (A) 03/17/2016 1238   Sepsis Labs: @LABRCNTIP (procalcitonin:4,lacticidven:4) )No results found for this or any previous visit (from the past 240 hour(s)).   Radiological Exams on Admission: Dg Chest Port 1 View  Result Date: 03/17/2016 CLINICAL DATA:  Redness around the wound on central chest. Evaluate PICC line. Shortness breath and cough. EXAM: PORTABLE CHEST 1 VIEW COMPARISON:  05/03/2015.  Chest CT 02/21/2016. FINDINGS: Left PICC line is in place with the tip in the upper right atrium approximately 3 cm below the cavoatrial junction. Heart is normal size. Left lung is clear. Airspace  disease in the right upper lobe concerning for pneumonia. No effusions. IMPRESSION: Left PICC line tip in the right atrium 3 cm below the cavoatrial junction. Right upper lobe opacity concerning for pneumonia. Electronically Signed   By: Rolm Baptise M.D.   On: 03/17/2016 12:38     Assessment/Plan: Active Problems:   Polysubstance abuse   Depression   IV drug abuse   Hepatitis C infection   COPD (chronic obstructive pulmonary disease) ([facility])   Opioid abuse   Pulmonary embolism, septic ([facility])   Septic arthritis of right sternoclavicular joint ([facility])   Anxiety   Cigarette smoker   Endocarditis of tricuspid valve    Chest pain in a patient with recent history of Septic arthritis of the right sternoclavicular joint.  Patient has a history of IVDU.  VACwas placed after I+D . D/c grew pseudomonas and comanomas. Current WBC 14.1. LActic acid 0.45. ID recommended cefepime 2 g every 12 hours for total of 6 weeks, end date was to be 04/05/2016. Patient left the NH 5 days ago, last IVDU 2 days ago with heroin .  Admit to inpatient telemetry ID recommends IV Vanc and Zosyn until consultation  AP client will be replaced as mentioned above, in view of likely infected line. Panculture  IV fluids CT of the chest   Tricuspid valve endocarditis TEE doneon 11/13and showed small vegetation involving the septal leaflet of the tricuspid valve. Vegetation measures 0.80.2 cm without evidence of tricuspid valve deterioration.  Will need long-term IV antibiotics asmentioned above.    Septic pulmonary embolism Recent CT showed  2 nodules one of 1.7 cm and the other 0.6 cm suspicious for septic emboli in the RML. TEE done and showed tricuspid valve endocarditis, septic to the lungs.  New CXR shows in addition possible RUL PNA. CT chest is pending  On Vanc/Zosyn, ID to see  VTE prohylaxis    Anemia, normocytic  Hemoglobin on admission 9.1, stable at baseline. No overt bleeding  Repeat CBC in  am   History of IV drug use with heroin Continue  Bentyl, Imodium and clonidine as needed. Careful use of pain meds   Hepatitis C Appears to be has chronic hepatitis, no transaminitis or coagulopathy. HIV nonreactive. Follow-up with ID clinic as outpatient.  Polysubstance abuse, IVDU as above Will need OP counseling   DVT prophylaxis:  Lovenox  Consultants:  CVTS and ID  Code Status:  Full code Family Communication:  None  Disposition Plan:  Indeterminate for now   Rondel Jumbo, PA-C [facility] Pager [phone]  If 7PM-7AM,  please contact night-coverage [email or web address] Password [email or web address]

## 2016-03-18 ENCOUNTER — Inpatient Hospital Stay (HOSPITAL_COMMUNITY): Payer: Medicare Other

## 2016-03-18 DIAGNOSIS — I2690 Septic pulmonary embolism without acute cor pulmonale: Secondary | ICD-10-CM

## 2016-03-18 DIAGNOSIS — Z8619 Personal history of other infectious and parasitic diseases: Secondary | ICD-10-CM

## 2016-03-18 DIAGNOSIS — B958 Unspecified staphylococcus as the cause of diseases classified elsewhere: Secondary | ICD-10-CM

## 2016-03-18 DIAGNOSIS — I330 Acute and subacute infective endocarditis: Secondary | ICD-10-CM

## 2016-03-18 DIAGNOSIS — M8618 Other acute osteomyelitis, other site: Secondary | ICD-10-CM

## 2016-03-18 DIAGNOSIS — F1110 Opioid abuse, uncomplicated: Secondary | ICD-10-CM

## 2016-03-18 LAB — BLOOD CULTURE ID PANEL (REFLEXED)
Acinetobacter baumannii: NOT DETECTED
Acinetobacter baumannii: NOT DETECTED
Candida albicans: NOT DETECTED
Candida albicans: NOT DETECTED
Candida glabrata: NOT DETECTED
Candida glabrata: NOT DETECTED
Candida krusei: NOT DETECTED
Candida krusei: NOT DETECTED
Candida parapsilosis: NOT DETECTED
Candida parapsilosis: NOT DETECTED
Candida tropicalis: NOT DETECTED
Candida tropicalis: NOT DETECTED
Enterobacter cloacae complex: NOT DETECTED
Enterobacter cloacae complex: NOT DETECTED
Enterobacteriaceae species: NOT DETECTED
Enterobacteriaceae species: NOT DETECTED
Enterococcus species: NOT DETECTED
Enterococcus species: NOT DETECTED
Escherichia coli: NOT DETECTED
Escherichia coli: NOT DETECTED
Haemophilus influenzae: NOT DETECTED
Haemophilus influenzae: NOT DETECTED
Klebsiella oxytoca: NOT DETECTED
Klebsiella oxytoca: NOT DETECTED
Klebsiella pneumoniae: NOT DETECTED
Klebsiella pneumoniae: NOT DETECTED
Listeria monocytogenes: NOT DETECTED
Listeria monocytogenes: NOT DETECTED
Methicillin resistance: DETECTED — AB
Neisseria meningitidis: NOT DETECTED
Neisseria meningitidis: NOT DETECTED
Proteus species: NOT DETECTED
Proteus species: NOT DETECTED
Pseudomonas aeruginosa: NOT DETECTED
Pseudomonas aeruginosa: NOT DETECTED
Serratia marcescens: NOT DETECTED
Serratia marcescens: NOT DETECTED
Staphylococcus aureus (BCID): DETECTED — AB
Staphylococcus aureus (BCID): NOT DETECTED
Staphylococcus species: DETECTED — AB
Staphylococcus species: NOT DETECTED
Streptococcus agalactiae: NOT DETECTED
Streptococcus agalactiae: NOT DETECTED
Streptococcus pneumoniae: NOT DETECTED
Streptococcus pneumoniae: NOT DETECTED
Streptococcus pyogenes: NOT DETECTED
Streptococcus pyogenes: NOT DETECTED
Streptococcus species: NOT DETECTED
Streptococcus species: NOT DETECTED

## 2016-03-18 LAB — COMPREHENSIVE METABOLIC PANEL
ALT: 13 U/L — ABNORMAL LOW (ref 14–54)
AST: 13 U/L — ABNORMAL LOW (ref 15–41)
Albumin: 2.8 g/dL — ABNORMAL LOW (ref 3.5–5.0)
Alkaline Phosphatase: 58 U/L (ref 38–126)
Anion gap: 11 (ref 5–15)
BUN: 7 mg/dL (ref 6–20)
CO2: 19 mmol/L — ABNORMAL LOW (ref 22–32)
Calcium: 8.7 mg/dL — ABNORMAL LOW (ref 8.9–10.3)
Chloride: 106 mmol/L (ref 101–111)
Creatinine, Ser: 0.68 mg/dL (ref 0.44–1.00)
GFR calc Af Amer: >60 mL/min (ref >60)
GFR calc non Af Amer: >60 mL/min (ref >60)
Glucose, Bld: 98 mg/dL (ref 65–99)
Potassium: 3.8 mmol/L (ref 3.5–5.1)
Sodium: 136 mmol/L (ref 135–145)
Total Bilirubin: 0.2 mg/dL — ABNORMAL LOW (ref 0.3–1.2)
Total Protein: 6.9 g/dL (ref 6.5–8.1)

## 2016-03-18 LAB — URINE CULTURE

## 2016-03-18 LAB — CBC
HCT: 30.1 % — ABNORMAL LOW (ref 36.0–46.0)
Hemoglobin: 9.7 g/dL — ABNORMAL LOW (ref 12.0–15.0)
MCH: 26.6 pg (ref 26.0–34.0)
MCHC: 32.2 g/dL (ref 30.0–36.0)
MCV: 82.5 fL (ref 78.0–100.0)
Platelets: 238 10*3/uL (ref 150–400)
RBC: 3.65 MIL/uL — ABNORMAL LOW (ref 3.87–5.11)
RDW: 20.2 % — ABNORMAL HIGH (ref 11.5–15.5)
WBC: 13.1 10*3/uL — ABNORMAL HIGH (ref 4.0–10.5)

## 2016-03-18 LAB — PROTIME-INR
INR: 1
Prothrombin Time: 13.2 seconds (ref 11.4–15.2)

## 2016-03-18 NOTE — Progress Notes (Addendum)
  PHARMACY - PHYSICIAN COMMUNICATION CRITICAL VALUE ALERT - BLOOD CULTURE IDENTIFICATION (BCID)  Results for orders placed or performed during the hospital encounter of 03/17/16  Blood Culture ID Panel (Reflexed) (Collected: 03/17/2016 11:07 AM)  Result Value Ref Range   Enterococcus species NOT DETECTED NOT DETECTED   Listeria monocytogenes NOT DETECTED NOT DETECTED   Staphylococcus species DETECTED (A) NOT DETECTED   Staphylococcus aureus DETECTED (A) NOT DETECTED   Methicillin resistance DETECTED (A) NOT DETECTED   Streptococcus species NOT DETECTED NOT DETECTED   Streptococcus agalactiae NOT DETECTED NOT DETECTED   Streptococcus pneumoniae NOT DETECTED NOT DETECTED   Streptococcus pyogenes NOT DETECTED NOT DETECTED   Acinetobacter baumannii NOT DETECTED NOT DETECTED   Enterobacteriaceae species NOT DETECTED NOT DETECTED   Enterobacter cloacae complex NOT DETECTED NOT DETECTED   Escherichia coli NOT DETECTED NOT DETECTED   Klebsiella oxytoca NOT DETECTED NOT DETECTED   Klebsiella pneumoniae NOT DETECTED NOT DETECTED   Proteus species NOT DETECTED NOT DETECTED   Serratia marcescens NOT DETECTED NOT DETECTED   Haemophilus influenzae NOT DETECTED NOT DETECTED   Neisseria meningitidis NOT DETECTED NOT DETECTED   Pseudomonas aeruginosa NOT DETECTED NOT DETECTED   Candida albicans NOT DETECTED NOT DETECTED   Candida glabrata NOT DETECTED NOT DETECTED   Candida krusei NOT DETECTED NOT DETECTED   Candida parapsilosis NOT DETECTED NOT DETECTED   Candida tropicalis NOT DETECTED NOT DETECTED    Name of physician (or Provider) Contacted: [facility]  Changes to prescribed antibiotics required:  None needed - the patient is already on Vancomycin + Cefepime. ID is consulted.   [facility]   Addendum: Lab called to state that GPR also in BCx - undetectable on BCID. No changes needed at this time. ID consulted.

## 2016-03-18 NOTE — Consult Note (Signed)
[place] Nurse wound consult note Reason for Consult: Consult requested for right chest wound.  Pt is familiar to [facility] team from previous admission, when she had surgical debridement of this location by the CT team and Vac was applied.  Pt was transferred to a SNF and had a Vac at that facility, but left AMA and states she was at home "for a few days without dressing changes." Wound type: Right clavicle area with full thickness wound; 7.2X2.5X.2cm Wound bed: 85% yellow, 15% red Drainage (amount, consistency, odor) Mod amt green drainage, some odor Periwound: Intact skin surrounding Dressing procedure/placement/frequency: Wound is fairly shallow and no longer appropriate for Vac therapy. Aquacel to absorb drainage and provide antimicrobial benefits.  Please consult Dr Roxan Hockey of the CT service to assess right chest wound and provide further input; he performed patient's surgery last month.   Please re-consult if further assistance is needed.  Thank-you,  Julien Girt MSN, [place], [place], [place], [place]

## 2016-03-18 NOTE — Progress Notes (Signed)
PROGRESS NOTE  [name]  [id number] DOB: 12/12/1970 DOA: 03/17/2016 PCP: [name] Patient Outpatient Specialists:  Subjective: Denies any fever or chills this morning, PICC line removed. Sternal wound still showing green discharge and smells like Pseudomonas.  Brief Narrative:  [name] is a 45 y.o. female with a history of IV drug use (primarily heroin), hepatitis C recently admitted from 11/11-11/18  For septic arthritis and endocarditis of the R sternoclavicular joint with sternoclavicular abscess incised and drained on 02/22/2016, subsequent VAC placed on November 14. She was discharged from here November 18 to [place] home   D/c grew pseudomonas and comanomas -ID recommended cefepime 2 g every 12 hours for total of 6 weeks, end date was to be 04/05/2016 but she left the facility 5 days ago AMA, and without antibiotics. She kept the PICC line  Last IV drug use with heroine was 2 days ago. She is now presenting with chest pain, and pain in the dorsal aspect of her hand, suspicious of infection.. Denies any fever or chills night sweats, nausea or vomiting. No diarrhea. No lower extremity swelling. She does complain of chest wall pain, and these drainage from her when the incision on the) sternal area. CVTS and ID  are to see the patient. 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 -Recently  discharged to nursing home with PICC line on cefepime. -She did haveComamonas bacteremia and Pseudomonas wound infection. -Left from the SNF AMA, admitted that she done heroin and cocaine in the interim. -Restarted back on cefepime and vancomycin, blood cultures drawn when MRSA this time.  MRSA bacteremia -2/2 blood cultures showing MRSA, PICC line removed. -Please note that she has TEE proved tricuspid valve endocarditis and septic pulmonary embolism. -ID on board, appreciate their help  Tricuspid valve endocarditis  -TEE doneon 11/13and showed small vegetation involving the septal leaflet of the tricuspid valve. Vegetation measures 0.80.2 cm without evidence of tricuspid valve deterioration.   Septic pulmonary embolism Recent CT showed  2 nodules one of 1.7 cm and the other 0.6 cm suspicious for septic emboli in the RML. TEE done and showed tricuspid valve endocarditis, septic to the lungs.  New CXR shows in addition possible RUL PNA. CT chest is pending  On Vanc/Zosyn, ID to see  VTE prohylaxis  Anemia, normocytic  Hemoglobin on admission 9.1, stable at baseline. No overt bleeding  Repeat CBC in am  History of IV drug use with heroin Continue  Bentyl, Imodium and clonidine as needed. Careful use of pain meds   Hepatitis C Appears to be has chronic hepatitis, no transaminitis or coagulopathy. HIV nonreactive. Follow-up with ID clinic as outpatient.  Polysubstance abuse, IVDU as above Will need OP counseling    DVT prophylaxis:  Code Status: Full Code Family Communication:  Disposition Plan:  Diet: Diet Heart Room service appropriate? Yes; Fluid consistency:  Thin  Consultants:   ID  Procedures:   None  Antimicrobials:   Cefepime and vancomycin.   Objective: Vitals:   03/17/16 1915 03/17/16 1930 03/17/16 2041 03/18/16 0518  BP: 108/66  (!) 100/53 130/78  Pulse: 90  86 89  Resp: (!) 28 18 18 20   Temp:   99.2 F (37.3 C) 98.1 F (36.7 C)  TempSrc:    Oral Oral  SpO2: 95%  95% 97%  Weight:      Height:        Intake/Output Summary (Last 24 hours) at 03/18/16 1236 Last data filed at 03/18/16 1032  Gross per 24 hour  Intake             1600 ml  Output             2550 ml  Net             -950 ml   Filed Weights   03/17/16 1015  Weight: 85.3 kg (188 lb)    Examination: General exam: Appears calm and comfortable  Respiratory system: Clear to auscultation. Respiratory effort normal. Cardiovascular system: S1 & S2 heard, RRR. No JVD, murmurs, rubs, gallops or clicks. No pedal edema. Gastrointestinal system: Abdomen is nondistended, soft and nontender. No organomegaly or masses felt. Normal bowel sounds heard. Central nervous system: Alert and oriented. No focal neurological deficits. Extremities: Symmetric 5 x 5 power. Skin: No rashes, lesions or ulcers Psychiatry: Judgement and insight appear normal. Mood & affect appropriate.   Data Reviewed: I have personally reviewed following labs and imaging studies  CBC:  Recent Labs Lab 03/17/16 1107 03/18/16 0419  WBC 14.1* 13.1*  NEUTROABS 9.7*  --   HGB 9.1* 9.7*  HCT 27.7* 30.1*  MCV 82.7 82.5  PLT 181 [id number]   Basic Metabolic Panel:  Recent Labs Lab 03/17/16 1107 03/18/16 0419  NA 134* 136  K 3.4* 3.8  CL 104 106  CO2 21* 19*  GLUCOSE 122* 98  BUN 8 7  CREATININE 0.67 0.68  CALCIUM 8.9 8.7*   GFR: Estimated Creatinine Clearance: 89 mL/min (by C-G formula based on SCr of 0.68 mg/dL). Liver Function Tests:  Recent Labs Lab 03/17/16 1107 03/18/16 0419  AST 13* 13*  ALT 16 13*  ALKPHOS 56 58  BILITOT 0.1* 0.2*  PROT 6.9 6.9  ALBUMIN 3.1* 2.8*   No results for input(s): LIPASE, AMYLASE in the last 168 hours. No results for input(s): AMMONIA in the last 168 hours. Coagulation Profile:  Recent Labs Lab 03/17/16 1107 03/18/16 0419  INR 1.19 1.00   Cardiac Enzymes: No results for input(s): CKTOTAL, CKMB, CKMBINDEX, TROPONINI in the last 168 hours. BNP  (last 3 results) No results for input(s): PROBNP in the last 8760 hours. HbA1C: No results for input(s): HGBA1C in the last 72 hours. CBG: No results for input(s): GLUCAP in the last 168 hours. Lipid Profile: No results for input(s): CHOL, HDL, LDLCALC, TRIG, CHOLHDL, LDLDIRECT in the last 72 hours. Thyroid Function Tests: No results for input(s): TSH, T4TOTAL, FREET4, T3FREE, THYROIDAB in the last 72 hours. Anemia Panel: No results for input(s): VITAMINB12, FOLATE, FERRITIN, TIBC, IRON, RETICCTPCT in the last 72 hours. Urine analysis:    Component Value Date/Time   COLORURINE YELLOW 03/17/2016 1238   APPEARANCEUR HAZY (A) 03/17/2016 1238   LABSPEC 1.012 03/17/2016 1238   PHURINE 6.0 03/17/2016 1238   GLUCOSEU NEGATIVE 03/17/2016 1238   HGBUR NEGATIVE 03/17/2016 1238   BILIRUBINUR NEGATIVE 03/17/2016 1238   KETONESUR  NEGATIVE 03/17/2016 1238   PROTEINUR NEGATIVE 03/17/2016 1238   UROBILINOGEN 0.2 10/18/2014 0100   NITRITE NEGATIVE 03/17/2016 1238   LEUKOCYTESUR SMALL (A) 03/17/2016 1238   Sepsis Labs: @LABRCNTIP (procalcitonin:4,lacticidven:4)  ) Recent Results (from the past 240 hour(s))  Blood culture (routine x 2)     Status: None (Preliminary result)   Collection Time: 03/17/16 11:07 AM  Result Value Ref Range Status   Specimen Description BLOOD LEFT ANTECUBITAL  Final   Special Requests BOTTLES DRAWN AEROBIC AND ANAEROBIC 5CC  Final   Culture  Setup Time   Final    GRAM POSITIVE COCCI IN CLUSTERS ANAEROBIC BOTTLE ONLY CRITICAL RESULT CALLED TO, READ BACK BY AND VERIFIED WITH: E MARTIN 03/18/16 @ 0956 M VESTAL GRAM POSITIVE RODS AEROBIC BOTTLE ONLY CRITICAL RESULT CALLED TO, READ BACK BY AND VERIFIED WITH: E. MARTIN PHARMD, AT 123012/7/17 BY D. VANHOOK    Culture GRAM POSITIVE COCCI GRAM POSITIVE RODS   Final   Report Status PENDING  Incomplete  Blood Culture ID Panel (Reflexed)     Status: Abnormal   Collection Time: 03/17/16 11:07 AM  Result Value Ref Range  Status   Enterococcus species NOT DETECTED NOT DETECTED Final   Listeria monocytogenes NOT DETECTED NOT DETECTED Final   Staphylococcus species DETECTED (A) NOT DETECTED Final    Comment: CRITICAL RESULT CALLED TO, READ BACK BY AND VERIFIED WITH: E MARTIN 03/18/16 @ 0956 M VESTAL    Staphylococcus aureus DETECTED (A) NOT DETECTED Final    Comment: CRITICAL RESULT CALLED TO, READ BACK BY AND VERIFIED WITH: E MARTIN 03/18/16 @ 0956 M VESTAL    Methicillin resistance DETECTED (A) NOT DETECTED Final    Comment: CRITICAL RESULT CALLED TO, READ BACK BY AND VERIFIED WITH: E MARTIN 03/18/16 @ 0956 M VESTAL    Streptococcus species NOT DETECTED NOT DETECTED Final   Streptococcus agalactiae NOT DETECTED NOT DETECTED Final   Streptococcus pneumoniae NOT DETECTED NOT DETECTED Final   Streptococcus pyogenes NOT DETECTED NOT DETECTED Final   Acinetobacter baumannii NOT DETECTED NOT DETECTED Final   Enterobacteriaceae species NOT DETECTED NOT DETECTED Final   Enterobacter cloacae complex NOT DETECTED NOT DETECTED Final   Escherichia coli NOT DETECTED NOT DETECTED Final   Klebsiella oxytoca NOT DETECTED NOT DETECTED Final   Klebsiella pneumoniae NOT DETECTED NOT DETECTED Final   Proteus species NOT DETECTED NOT DETECTED Final   Serratia marcescens NOT DETECTED NOT DETECTED Final   Haemophilus influenzae NOT DETECTED NOT DETECTED Final   Neisseria meningitidis NOT DETECTED NOT DETECTED Final   Pseudomonas aeruginosa NOT DETECTED NOT DETECTED Final   Candida albicans NOT DETECTED NOT DETECTED Final   Candida glabrata NOT DETECTED NOT DETECTED Final   Candida krusei NOT DETECTED NOT DETECTED Final   Candida parapsilosis NOT DETECTED NOT DETECTED Final   Candida tropicalis NOT DETECTED NOT DETECTED Final  Blood Culture ID Panel (Reflexed)     Status: None   Collection Time: 03/17/16 11:07 AM  Result Value Ref Range Status   Enterococcus species NOT DETECTED NOT DETECTED Final   Listeria monocytogenes  NOT DETECTED NOT DETECTED Final   Staphylococcus species NOT DETECTED NOT DETECTED Final   Staphylococcus aureus NOT DETECTED NOT DETECTED Final   Streptococcus species NOT DETECTED NOT DETECTED Final   Streptococcus agalactiae NOT DETECTED NOT DETECTED Final   Streptococcus pneumoniae NOT DETECTED NOT DETECTED Final   Streptococcus pyogenes NOT DETECTED NOT DETECTED Final   Acinetobacter baumannii NOT DETECTED NOT DETECTED Final   Enterobacteriaceae species  NOT DETECTED NOT DETECTED Final   Enterobacter cloacae complex NOT DETECTED NOT DETECTED Final   Escherichia coli NOT DETECTED NOT DETECTED Final   Klebsiella oxytoca NOT DETECTED NOT DETECTED Final   Klebsiella pneumoniae NOT DETECTED NOT DETECTED Final   Proteus species NOT DETECTED NOT DETECTED Final   Serratia marcescens NOT DETECTED NOT DETECTED Final   Haemophilus influenzae NOT DETECTED NOT DETECTED Final   Neisseria meningitidis NOT DETECTED NOT DETECTED Final   Pseudomonas aeruginosa NOT DETECTED NOT DETECTED Final   Candida albicans NOT DETECTED NOT DETECTED Final   Candida glabrata NOT DETECTED NOT DETECTED Final   Candida krusei NOT DETECTED NOT DETECTED Final   Candida parapsilosis NOT DETECTED NOT DETECTED Final   Candida tropicalis NOT DETECTED NOT DETECTED Final  Urine culture     Status: Abnormal   Collection Time: 03/17/16 12:38 PM  Result Value Ref Range Status   Specimen Description URINE, RANDOM  Final   Special Requests NONE  Final   Culture MULTIPLE SPECIES PRESENT, SUGGEST RECOLLECTION (A)  Final   Report Status 03/18/2016 FINAL  Final     Invalid input(s): PROCALCITONIN, LACTICACIDVEN   Radiology Studies: X-ray Chest Pa And Lateral  Result Date: 03/18/2016 CLINICAL DATA:  Weakness and chest congestion without cough or fever. PICC line removal yesterday. History of COPD, current smoker, pulmonary embolism, hepatitis-C, IV drug use and polysubstance abuse. EXAM: CHEST  2 VIEW COMPARISON:  Portable  chest x-ray of March 17, 2016 FINDINGS: The lungs are adequately inflated. There is mild interstitial prominence in the right lower lung with new small right pleural effusion. There is a trace of pleural fluid on the left as well. No infiltrate on the left is demonstrated. The heart and pulmonary vascularity are normal. The mediastinum is normal in width. The bony thorax exhibits no acute abnormality. IMPRESSION: Probable atelectasis at the right lung base. Small right pleural effusion and trace left pleural effusion. No discrete alveolar pneumonia. No CHF. Given the patient's clinical history and findings, follow-up chest x-ray in 2-3 weeks following anticipated antibiotic therapy is recommended to assure clearing Electronically Signed   By: David  Martinique M.D.   On: 03/18/2016 08:18   Dg Chest Port 1 View  Result Date: 03/17/2016 CLINICAL DATA:  Pt c/o cough and SOB x 1 week. Pt has a wound vac in the center of her chest. MD states PICC line is going to come out. Hx of COPD, PE, AND hepatitis C. Pt is a current smoker. EXAM: PORTABLE CHEST 1 VIEW COMPARISON:  Earlier today at 1233 hours FINDINGS: 1326 hours. Interval removal of left-sided PICC line. Numerous leads and wires project over the chest. Midline trachea. Normal heart size. Remote posterior lateral left sixth rib trauma. Improvement in right upper lobe airspace disease. IMPRESSION: Improvement in right upper lobe airspace disease, which remains suspicious for pneumonia. Removal of left PICC line. Electronically Signed   By: Abigail Miyamoto M.D.   On: 03/17/2016 13:35   Dg Chest Port 1 View  Result Date: 03/17/2016 CLINICAL DATA:  Redness around the wound on central chest. Evaluate PICC line. Shortness breath and cough. EXAM: PORTABLE CHEST 1 VIEW COMPARISON:  05/03/2015.  Chest CT 02/21/2016. FINDINGS: Left PICC line is in place with the tip in the upper right atrium approximately 3 cm below the cavoatrial junction. Heart is normal size. Left lung  is clear. Airspace disease in the right upper lobe concerning for pneumonia. No effusions. IMPRESSION: Left PICC line tip in the right atrium 3  cm below the cavoatrial junction. Right upper lobe opacity concerning for pneumonia. Electronically Signed   By: Rolm Baptise M.D.   On: 03/17/2016 12:38        Scheduled Meds: . ceFEPime (MAXIPIME) IV  2 g Intravenous Q12H  . heparin  5,000 Units Subcutaneous Q8H  . sodium chloride flush  3 mL Intravenous Q12H  . vancomycin  1,000 mg Intravenous Q8H   Continuous Infusions: . sodium chloride 1,000 mL (03/18/16 0856)     LOS: 1 day    Time spent: 35 minutes    Kamillah Didonato A, MD [facility] Pager [phone]  If 7PM-7AM, please contact night-coverage [email or web address] Password [facility] 03/18/2016, 12:36 PM

## 2016-03-18 NOTE — Progress Notes (Signed)
Admitted pt.to [facility],from ED ;AAO X4 ;bec.of chest  pain .She has a surgical incision on her rt.upper chest from recent admission for sternoclavicular abscess & I&D was done 02/22/16& wound vac was placed 11/14 & she was d/c to [facility].Made comfortable in bed ,VS taken & placed her on tele.& oriented to room & call bell.Will continue to monitor pt.

## 2016-03-18 NOTE — Progress Notes (Signed)
PT Cancellation Note  Patient Details Name: [name] MRN: [id number] DOB: 1971/02/14   Cancelled Treatment:    Reason Eval/Treat Not Completed: PT screened, no needs identified, will sign off. Spoke with pt who denied need for PT services. Will sign off at pt's request.    Weston Anna, MPT Pager: [phone]

## 2016-03-18 NOTE — Progress Notes (Signed)
INFECTIOUS DISEASE PROGRESS NOTE  ID: [name] is a 45 y.o. female with  Active Problems:   Polysubstance abuse   Depression   IV drug abuse   Hepatitis C infection   COPD (chronic obstructive pulmonary disease) (HCC)   Opioid abuse   Pulmonary embolism, septic (HCC)   Septic arthritis of right sternoclavicular joint (HCC)   Anxiety   Cigarette smoker   Endocarditis of tricuspid valve   Wound infection  Subjective: Without complaints She looks better  Abtx:  Anti-infectives    Start     Dose/Rate Route Frequency Ordered Stop   03/17/16 2230  vancomycin (VANCOCIN) IVPB 1000 mg/200 mL premix     1,000 mg 200 mL/hr over 60 Minutes Intravenous Every 8 hours 03/17/16 1416     03/17/16 1430  vancomycin (VANCOCIN) 1,500 mg in sodium chloride 0.9 % 500 mL IVPB     1,500 mg 250 mL/hr over 120 Minutes Intravenous  Once 03/17/16 1353 03/17/16 1858   03/17/16 1330  ceFEPIme (MAXIPIME) 2 g in dextrose 5 % 50 mL IVPB     2 g 100 mL/hr over 30 Minutes Intravenous Every 12 hours 03/17/16 1327        Medications:  Scheduled: . ceFEPime (MAXIPIME) IV  2 g Intravenous Q12H  . heparin  5,000 Units Subcutaneous Q8H  . sodium chloride flush  3 mL Intravenous Q12H  . vancomycin  1,000 mg Intravenous Q8H    Objective: Vital signs in last 24 hours: Temp:  [98.1 F (36.7 C)-99.2 F (37.3 C)] 98.1 F (36.7 C) (12/07 0518) Pulse Rate:  [84-93] 89 (12/07 0518) Resp:  [16-28] 20 (12/07 0518) BP: (100-138)/(53-80) 130/78 (12/07 0518) SpO2:  [86 %-100 %] 97 % (12/07 0518)   General appearance: alert, cooperative and no distress Resp: clear to auscultation bilaterally Chest wall: spuerficial wound with deeper os superiorly.  Cardio: regular rate and rhythm GI: normal findings: bowel sounds normal and soft, non-tender Extremities: LUE PUIC removed. LUE peripheral IV.   Lab Results  Recent Labs  03/17/16 1107 03/18/16 0419  WBC 14.1* 13.1*  HGB 9.1* 9.7*  HCT 27.7* 30.1*    NA 134* 136  K 3.4* 3.8  CL 104 106  CO2 21* 19*  BUN 8 7  CREATININE 0.67 0.68   Liver Panel  Recent Labs  03/17/16 1107 03/18/16 0419  PROT 6.9 6.9  ALBUMIN 3.1* 2.8*  AST 13* 13*  ALT 16 13*  ALKPHOS 56 58  BILITOT 0.1* 0.2*   Sedimentation Rate No results for input(s): ESRSEDRATE in the last 72 hours. C-Reactive Protein No results for input(s): CRP in the last 72 hours.  Microbiology: Recent Results (from the past 240 hour(s))  Blood culture (routine x 2)     Status: None (Preliminary result)   Collection Time: 03/17/16 11:07 AM  Result Value Ref Range Status   Specimen Description BLOOD LEFT ANTECUBITAL  Final   Special Requests BOTTLES DRAWN AEROBIC AND ANAEROBIC 5CC  Final   Culture  Setup Time   Final    GRAM POSITIVE COCCI IN CLUSTERS ANAEROBIC BOTTLE ONLY CRITICAL RESULT CALLED TO, READ BACK BY AND VERIFIED WITH: E MARTIN 03/18/16 @ 0956 M VESTAL GRAM POSITIVE RODS AEROBIC BOTTLE ONLY    Culture GRAM POSITIVE COCCI GRAM POSITIVE RODS   Final   Report Status PENDING  Incomplete  Blood Culture ID Panel (Reflexed)     Status: Abnormal   Collection Time: 03/17/16 11:07 AM  Result Value Ref Range  Status   Enterococcus species NOT DETECTED NOT DETECTED Final   Listeria monocytogenes NOT DETECTED NOT DETECTED Final   Staphylococcus species DETECTED (A) NOT DETECTED Final    Comment: CRITICAL RESULT CALLED TO, READ BACK BY AND VERIFIED WITH: E MARTIN 03/18/16 @ 0956 M VESTAL    Staphylococcus aureus DETECTED (A) NOT DETECTED Final    Comment: CRITICAL RESULT CALLED TO, READ BACK BY AND VERIFIED WITH: E MARTIN 03/18/16 @ 0956 M VESTAL    Methicillin resistance DETECTED (A) NOT DETECTED Final    Comment: CRITICAL RESULT CALLED TO, READ BACK BY AND VERIFIED WITH: E MARTIN 03/18/16 @ 0956 M VESTAL    Streptococcus species NOT DETECTED NOT DETECTED Final   Streptococcus agalactiae NOT DETECTED NOT DETECTED Final   Streptococcus pneumoniae NOT DETECTED NOT  DETECTED Final   Streptococcus pyogenes NOT DETECTED NOT DETECTED Final   Acinetobacter baumannii NOT DETECTED NOT DETECTED Final   Enterobacteriaceae species NOT DETECTED NOT DETECTED Final   Enterobacter cloacae complex NOT DETECTED NOT DETECTED Final   Escherichia coli NOT DETECTED NOT DETECTED Final   Klebsiella oxytoca NOT DETECTED NOT DETECTED Final   Klebsiella pneumoniae NOT DETECTED NOT DETECTED Final   Proteus species NOT DETECTED NOT DETECTED Final   Serratia marcescens NOT DETECTED NOT DETECTED Final   Haemophilus influenzae NOT DETECTED NOT DETECTED Final   Neisseria meningitidis NOT DETECTED NOT DETECTED Final   Pseudomonas aeruginosa NOT DETECTED NOT DETECTED Final   Candida albicans NOT DETECTED NOT DETECTED Final   Candida glabrata NOT DETECTED NOT DETECTED Final   Candida krusei NOT DETECTED NOT DETECTED Final   Candida parapsilosis NOT DETECTED NOT DETECTED Final   Candida tropicalis NOT DETECTED NOT DETECTED Final  Urine culture     Status: Abnormal   Collection Time: 03/17/16 12:38 PM  Result Value Ref Range Status   Specimen Description URINE, RANDOM  Final   Special Requests NONE  Final   Culture MULTIPLE SPECIES PRESENT, SUGGEST RECOLLECTION (A)  Final   Report Status 03/18/2016 FINAL  Final    Studies/Results: X-ray Chest Pa And Lateral  Result Date: 03/18/2016 CLINICAL DATA:  Weakness and chest congestion without cough or fever. PICC line removal yesterday. History of COPD, current smoker, pulmonary embolism, hepatitis-C, IV drug use and polysubstance abuse. EXAM: CHEST  2 VIEW COMPARISON:  Portable chest x-ray of March 17, 2016 FINDINGS: The lungs are adequately inflated. There is mild interstitial prominence in the right lower lung with new small right pleural effusion. There is a trace of pleural fluid on the left as well. No infiltrate on the left is demonstrated. The heart and pulmonary vascularity are normal. The mediastinum is normal in width. The bony  thorax exhibits no acute abnormality. IMPRESSION: Probable atelectasis at the right lung base. Small right pleural effusion and trace left pleural effusion. No discrete alveolar pneumonia. No CHF. Given the patient's clinical history and findings, follow-up chest x-ray in 2-3 weeks following anticipated antibiotic therapy is recommended to assure clearing Electronically Signed   By: David  Martinique M.D.   On: 03/18/2016 08:18   Dg Chest Port 1 View  Result Date: 03/17/2016 CLINICAL DATA:  Pt c/o cough and SOB x 1 week. Pt has a wound vac in the center of her chest. MD states PICC line is going to come out. Hx of COPD, PE, AND hepatitis C. Pt is a current smoker. EXAM: PORTABLE CHEST 1 VIEW COMPARISON:  Earlier today at 1233 hours FINDINGS: 1326 hours. Interval removal of left-sided  PICC line. Numerous leads and wires project over the chest. Midline trachea. Normal heart size. Remote posterior lateral left sixth rib trauma. Improvement in right upper lobe airspace disease. IMPRESSION: Improvement in right upper lobe airspace disease, which remains suspicious for pneumonia. Removal of left PICC line. Electronically Signed   By: Abigail Miyamoto M.D.   On: 03/17/2016 13:35   Dg Chest Port 1 View  Result Date: 03/17/2016 CLINICAL DATA:  Redness around the wound on central chest. Evaluate PICC line. Shortness breath and cough. EXAM: PORTABLE CHEST 1 VIEW COMPARISON:  05/03/2015.  Chest CT 02/21/2016. FINDINGS: Left PICC line is in place with the tip in the upper right atrium approximately 3 cm below the cavoatrial junction. Heart is normal size. Left lung is clear. Airspace disease in the right upper lobe concerning for pneumonia. No effusions. IMPRESSION: Left PICC line tip in the right atrium 3 cm below the cavoatrial junction. Right upper lobe opacity concerning for pneumonia. Electronically Signed   By: Rolm Baptise M.D.   On: 03/17/2016 12:38     Assessment/Plan: Staph bacteremia Sternal Osteomyelitis TV  IE Prev Pseudomonas wound Cx and Comomonas bacteremia  Continue cefepime and vanco awwait further info of BCx PIC is out.  CVTS f/u  Total days of antibiotics: 1 vanco/cefepime         [name] Infectious Diseases (pager) [phone] [email or web address] 03/18/2016, 12:23 PM  LOS: 1 day

## 2016-03-19 DIAGNOSIS — L089 Local infection of the skin and subcutaneous tissue, unspecified: Secondary | ICD-10-CM

## 2016-03-19 DIAGNOSIS — B182 Chronic viral hepatitis C: Secondary | ICD-10-CM

## 2016-03-19 DIAGNOSIS — I368 Other nonrheumatic tricuspid valve disorders: Secondary | ICD-10-CM

## 2016-03-19 DIAGNOSIS — M00811 Arthritis due to other bacteria, right shoulder: Secondary | ICD-10-CM

## 2016-03-19 DIAGNOSIS — T148XXA Other injury of unspecified body region, initial encounter: Secondary | ICD-10-CM

## 2016-03-19 LAB — CBC
HCT: 30.3 % — ABNORMAL LOW (ref 36.0–46.0)
Hemoglobin: 9.8 g/dL — ABNORMAL LOW (ref 12.0–15.0)
MCH: 26.7 pg (ref 26.0–34.0)
MCHC: 32.3 g/dL (ref 30.0–36.0)
MCV: 82.6 fL (ref 78.0–100.0)
Platelets: 271 10*3/uL (ref 150–400)
RBC: 3.67 MIL/uL — ABNORMAL LOW (ref 3.87–5.11)
RDW: 19.8 % — ABNORMAL HIGH (ref 11.5–15.5)
WBC: 9.2 10*3/uL (ref 4.0–10.5)

## 2016-03-19 LAB — BASIC METABOLIC PANEL
Anion gap: 10 (ref 5–15)
BUN: 9 mg/dL (ref 6–20)
CO2: 23 mmol/L (ref 22–32)
Calcium: 9 mg/dL (ref 8.9–10.3)
Chloride: 105 mmol/L (ref 101–111)
Creatinine, Ser: 0.68 mg/dL (ref 0.44–1.00)
GFR calc Af Amer: >60 mL/min (ref >60)
GFR calc non Af Amer: >60 mL/min (ref >60)
Glucose, Bld: 102 mg/dL — ABNORMAL HIGH (ref 65–99)
Potassium: 3.7 mmol/L (ref 3.5–5.1)
Sodium: 138 mmol/L (ref 135–145)

## 2016-03-19 NOTE — Progress Notes (Signed)
INFECTIOUS DISEASE PROGRESS NOTE  ID: [name] is a 45 y.o. female with  Active Problems:   Polysubstance abuse   Depression   IV drug abuse   Hepatitis C infection   COPD (chronic obstructive pulmonary disease) (HCC)   Opioid abuse   Pulmonary embolism, septic (HCC)   Septic arthritis of right sternoclavicular joint (HCC)   Anxiety   Cigarette smoker   Endocarditis of tricuspid valve   Wound infection  Subjective: Without complaints  Abtx:  Anti-infectives    Start     Dose/Rate Route Frequency Ordered Stop   03/17/16 2230  vancomycin (VANCOCIN) IVPB 1000 mg/200 mL premix     1,000 mg 200 mL/hr over 60 Minutes Intravenous Every 8 hours 03/17/16 1416     03/17/16 1430  vancomycin (VANCOCIN) 1,500 mg in sodium chloride 0.9 % 500 mL IVPB     1,500 mg 250 mL/hr over 120 Minutes Intravenous  Once 03/17/16 1353 03/17/16 1858   03/17/16 1330  ceFEPIme (MAXIPIME) 2 g in dextrose 5 % 50 mL IVPB     2 g 100 mL/hr over 30 Minutes Intravenous Every 12 hours 03/17/16 1327        Medications:  Scheduled: . ceFEPime (MAXIPIME) IV  2 g Intravenous Q12H  . heparin  5,000 Units Subcutaneous Q8H  . sodium chloride flush  3 mL Intravenous Q12H  . vancomycin  1,000 mg Intravenous Q8H    Objective: Vital signs in last 24 hours: Temp:  [98.1 F (36.7 C)-98.7 F (37.1 C)] 98.1 F (36.7 C) (12/08 0454) Pulse Rate:  [71-82] 71 (12/08 0454) Resp:  [18-19] 18 (12/08 0454) BP: (110-123)/(63-69) 123/69 (12/08 0454) SpO2:  [96 %-98 %] 96 % (12/08 0454)   General appearance: alert, cooperative and no distress Resp: clear to auscultation bilaterally Chest wall: dressed wound Cardio: regular rate and rhythm GI: normal findings: bowel sounds normal and soft, non-tender  Lab Results  Recent Labs  03/18/16 0419 03/19/16 0522  WBC 13.1* 9.2  HGB 9.7* 9.8*  HCT 30.1* 30.3*  NA 136 138  K 3.8 3.7  CL 106 105  CO2 19* 23  BUN 7 9  CREATININE 0.68 0.68   Liver  Panel  Recent Labs  03/17/16 1107 03/18/16 0419  PROT 6.9 6.9  ALBUMIN 3.1* 2.8*  AST 13* 13*  ALT 16 13*  ALKPHOS 56 58  BILITOT 0.1* 0.2*   Sedimentation Rate No results for input(s): ESRSEDRATE in the last 72 hours. C-Reactive Protein No results for input(s): CRP in the last 72 hours.  Microbiology: Recent Results (from the past 240 hour(s))  Blood culture (routine x 2)     Status: Abnormal (Preliminary result)   Collection Time: 03/17/16 11:07 AM  Result Value Ref Range Status   Specimen Description BLOOD LEFT ANTECUBITAL  Final   Special Requests BOTTLES DRAWN AEROBIC AND ANAEROBIC 5CC  Final   Culture  Setup Time   Final    GRAM POSITIVE COCCI IN CLUSTERS ANAEROBIC BOTTLE ONLY CRITICAL RESULT CALLED TO, READ BACK BY AND VERIFIED WITH: E MARTIN 03/18/16 @ 0956 M VESTAL GRAM POSITIVE RODS AEROBIC BOTTLE ONLY CRITICAL RESULT CALLED TO, READ BACK BY AND VERIFIED WITH: E. MARTIN PHARMD, AT 123012/7/17 BY D. VANHOOK    Culture (A)  Final    STAPHYLOCOCCUS AUREUS SUSCEPTIBILITIES TO FOLLOW DIPHTHEROIDS(CORYNEBACTERIUM SPECIES) Standardized susceptibility testing for this organism is not available.    Report Status PENDING  Incomplete  Blood Culture ID Panel (Reflexed)  Status: Abnormal   Collection Time: 03/17/16 11:07 AM  Result Value Ref Range Status   Enterococcus species NOT DETECTED NOT DETECTED Final   Listeria monocytogenes NOT DETECTED NOT DETECTED Final   Staphylococcus species DETECTED (A) NOT DETECTED Final    Comment: CRITICAL RESULT CALLED TO, READ BACK BY AND VERIFIED WITH: E MARTIN 03/18/16 @ 0956 M VESTAL    Staphylococcus aureus DETECTED (A) NOT DETECTED Final    Comment: CRITICAL RESULT CALLED TO, READ BACK BY AND VERIFIED WITH: E MARTIN 03/18/16 @ 0956 M VESTAL    Methicillin resistance DETECTED (A) NOT DETECTED Final    Comment: CRITICAL RESULT CALLED TO, READ BACK BY AND VERIFIED WITH: E MARTIN 03/18/16 @ 0956 M VESTAL    Streptococcus  species NOT DETECTED NOT DETECTED Final   Streptococcus agalactiae NOT DETECTED NOT DETECTED Final   Streptococcus pneumoniae NOT DETECTED NOT DETECTED Final   Streptococcus pyogenes NOT DETECTED NOT DETECTED Final   Acinetobacter baumannii NOT DETECTED NOT DETECTED Final   Enterobacteriaceae species NOT DETECTED NOT DETECTED Final   Enterobacter cloacae complex NOT DETECTED NOT DETECTED Final   Escherichia coli NOT DETECTED NOT DETECTED Final   Klebsiella oxytoca NOT DETECTED NOT DETECTED Final   Klebsiella pneumoniae NOT DETECTED NOT DETECTED Final   Proteus species NOT DETECTED NOT DETECTED Final   Serratia marcescens NOT DETECTED NOT DETECTED Final   Haemophilus influenzae NOT DETECTED NOT DETECTED Final   Neisseria meningitidis NOT DETECTED NOT DETECTED Final   Pseudomonas aeruginosa NOT DETECTED NOT DETECTED Final   Candida albicans NOT DETECTED NOT DETECTED Final   Candida glabrata NOT DETECTED NOT DETECTED Final   Candida krusei NOT DETECTED NOT DETECTED Final   Candida parapsilosis NOT DETECTED NOT DETECTED Final   Candida tropicalis NOT DETECTED NOT DETECTED Final  Blood Culture ID Panel (Reflexed)     Status: None   Collection Time: 03/17/16 11:07 AM  Result Value Ref Range Status   Enterococcus species NOT DETECTED NOT DETECTED Final   Listeria monocytogenes NOT DETECTED NOT DETECTED Final   Staphylococcus species NOT DETECTED NOT DETECTED Final   Staphylococcus aureus NOT DETECTED NOT DETECTED Final   Streptococcus species NOT DETECTED NOT DETECTED Final   Streptococcus agalactiae NOT DETECTED NOT DETECTED Final   Streptococcus pneumoniae NOT DETECTED NOT DETECTED Final   Streptococcus pyogenes NOT DETECTED NOT DETECTED Final   Acinetobacter baumannii NOT DETECTED NOT DETECTED Final   Enterobacteriaceae species NOT DETECTED NOT DETECTED Final   Enterobacter cloacae complex NOT DETECTED NOT DETECTED Final   Escherichia coli NOT DETECTED NOT DETECTED Final   Klebsiella  oxytoca NOT DETECTED NOT DETECTED Final   Klebsiella pneumoniae NOT DETECTED NOT DETECTED Final   Proteus species NOT DETECTED NOT DETECTED Final   Serratia marcescens NOT DETECTED NOT DETECTED Final   Haemophilus influenzae NOT DETECTED NOT DETECTED Final   Neisseria meningitidis NOT DETECTED NOT DETECTED Final   Pseudomonas aeruginosa NOT DETECTED NOT DETECTED Final   Candida albicans NOT DETECTED NOT DETECTED Final   Candida glabrata NOT DETECTED NOT DETECTED Final   Candida krusei NOT DETECTED NOT DETECTED Final   Candida parapsilosis NOT DETECTED NOT DETECTED Final   Candida tropicalis NOT DETECTED NOT DETECTED Final  Urine culture     Status: Abnormal   Collection Time: 03/17/16 12:38 PM  Result Value Ref Range Status   Specimen Description URINE, RANDOM  Final   Special Requests NONE  Final   Culture MULTIPLE SPECIES PRESENT, SUGGEST RECOLLECTION (A)  Final  Report Status 03/18/2016 FINAL  Final  Blood culture (routine x 2)     Status: None (Preliminary result)   Collection Time: 03/17/16  1:30 PM  Result Value Ref Range Status   Specimen Description BLOOD RIGHT WRIST  Final   Special Requests IN PEDIATRIC BOTTLE 2CC  Final   Culture NO GROWTH 2 DAYS  Final   Report Status PENDING  Incomplete  Culture, blood (Routine X 2) w Reflex to ID Panel     Status: None (Preliminary result)   Collection Time: 03/18/16  2:06 PM  Result Value Ref Range Status   Specimen Description BLOOD RIGHT HAND  Final   Special Requests IN PEDIATRIC BOTTLE 1CC  Final   Culture NO GROWTH < 24 HOURS  Final   Report Status PENDING  Incomplete    Studies/Results: X-ray Chest Pa And Lateral  Result Date: 03/18/2016 CLINICAL DATA:  Weakness and chest congestion without cough or fever. PICC line removal yesterday. History of COPD, current smoker, pulmonary embolism, hepatitis-C, IV drug use and polysubstance abuse. EXAM: CHEST  2 VIEW COMPARISON:  Portable chest x-ray of March 17, 2016 FINDINGS: The  lungs are adequately inflated. There is mild interstitial prominence in the right lower lung with new small right pleural effusion. There is a trace of pleural fluid on the left as well. No infiltrate on the left is demonstrated. The heart and pulmonary vascularity are normal. The mediastinum is normal in width. The bony thorax exhibits no acute abnormality. IMPRESSION: Probable atelectasis at the right lung base. Small right pleural effusion and trace left pleural effusion. No discrete alveolar pneumonia. No CHF. Given the patient's clinical history and findings, follow-up chest x-ray in 2-3 weeks following anticipated antibiotic therapy is recommended to assure clearing Electronically Signed   By: David  Martinique M.D.   On: 03/18/2016 08:18   Dg Chest Port 1 View  Result Date: 03/17/2016 CLINICAL DATA:  Pt c/o cough and SOB x 1 week. Pt has a wound vac in the center of her chest. MD states PICC line is going to come out. Hx of COPD, PE, AND hepatitis C. Pt is a current smoker. EXAM: PORTABLE CHEST 1 VIEW COMPARISON:  Earlier today at 1233 hours FINDINGS: 1326 hours. Interval removal of left-sided PICC line. Numerous leads and wires project over the chest. Midline trachea. Normal heart size. Remote posterior lateral left sixth rib trauma. Improvement in right upper lobe airspace disease. IMPRESSION: Improvement in right upper lobe airspace disease, which remains suspicious for pneumonia. Removal of left PICC line. Electronically Signed   By: Abigail Miyamoto M.D.   On: 03/17/2016 13:35   Dg Chest Port 1 View  Result Date: 03/17/2016 CLINICAL DATA:  Redness around the wound on central chest. Evaluate PICC line. Shortness breath and cough. EXAM: PORTABLE CHEST 1 VIEW COMPARISON:  05/03/2015.  Chest CT 02/21/2016. FINDINGS: Left PICC line is in place with the tip in the upper right atrium approximately 3 cm below the cavoatrial junction. Heart is normal size. Left lung is clear. Airspace disease in the right upper  lobe concerning for pneumonia. No effusions. IMPRESSION: Left PICC line tip in the right atrium 3 cm below the cavoatrial junction. Right upper lobe opacity concerning for pneumonia. Electronically Signed   By: Rolm Baptise M.D.   On: 03/17/2016 12:38     Assessment/Plan: Staph bacteremia Sternal Osteomyelitis TV IE Prev Pseudomonas wound Cx and Comomonas bacteremia  Continue cefepime and vanco awwait further info of BCx- Staph aureus/MRSA, Co Ag  Neg Staph, Corynebacter PIC is out.  CVTS f/u No change in anbx  Dr Megan Salon available if questions over weekend  Total days of antibiotics: 2 vanco/cefepime          [place] (pager) [phone] [email or web address] 03/19/2016, 10:51 AM  LOS: 2 days

## 2016-03-19 NOTE — Progress Notes (Signed)
PROGRESS NOTE  [name]  [id number] DOB: 01/31/1971 DOA: 03/17/2016 PCP: [name] Patient Outpatient Specialists:  Subjective: No changes clinically, continue current antibiotics  Brief Narrative:  [name] is a 45 y.o. female with a history of IV drug use (primarily heroin), hepatitis C recently admitted from 11/11-11/18  For septic arthritis and endocarditis of the R sternoclavicular joint with sternoclavicular abscess incised and drained on 02/22/2016, subsequent VAC placed on November 14. She was discharged from here November 18 to [facility]   D/c grew pseudomonas and comanomas -ID recommended cefepime 2 g every 12 hours for total of 6 weeks, end date was to be 04/05/2016 but she left the facility 5 days ago AMA, and without antibiotics. She kept the PICC line  Last IV drug use with heroine was 2 days ago. She is now presenting with chest pain, and pain in the dorsal aspect of her hand, suspicious of infection.. Denies any fever or chills night sweats, nausea or vomiting. No diarrhea. No lower extremity swelling. She does complain of chest wall pain, and these drainage from her when the incision on the) sternal area. CVTS and ID  are to see the patient. 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 -Recently discharged to nursing home with PICC line on cefepime. -She did have Comamonas  bacteremia and Pseudomonas wound infection. -Left from the SNF AMA, admitted that she done heroin and cocaine in the interim. -Restarted back on cefepime and vancomycin, blood cultures drawn when MRSA this time.  MRSA bacteremia -2/2 blood cultures showing MRSA, PICC line removed. 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 -ID on board, appreciate their help  Tricuspid valve endocarditis  -TEE doneon 11/13and showed small vegetation involving the septal leaflet of the tricuspid valve. Vegetation measures 0.80.2 cm without evidence of tricuspid valve deterioration.   Septic pulmonary embolism Recent CT showed  2 nodules one of 1.7 cm and the other 0.6 cm suspicious for septic emboli in the RML. TEE done and showed tricuspid valve endocarditis, septic to the lungs.  New CXR shows in addition possible RUL PNA.   Anemia, normocytic  Hemoglobin on admission 9.1, stable at baseline. No overt bleeding  Repeat CBC in am  History of IV drug use with heroin Continue  Bentyl, Imodium and clonidine as needed. Careful use of pain meds   Hepatitis C Appears to be has chronic hepatitis, no transaminitis or coagulopathy. HIV nonreactive. Follow-up with ID clinic as outpatient.  Polysubstance abuse, IVDU as above Will need OP counseling    DVT prophylaxis:  Code Status: Full Code Family Communication:  Disposition Plan:  Diet: Diet Heart Room service appropriate? Yes; Fluid consistency: Thin  Consultants:   ID  Procedures:   None  Antimicrobials:   Cefepime and vancomycin.   Objective: Vitals:   03/18/16 0518  03/18/16 1327 03/18/16 2050 03/19/16 0454  BP: 130/78 114/65 110/63 123/69  Pulse: 89 82 81 71  Resp: 20 19 18 18   Temp: 98.1 F (36.7 C) 98.7 F (37.1 C) 98.6 F (37 C) 98.1 F (36.7 C)  TempSrc: Oral Oral Oral Oral  SpO2: 97% 97% 98% 96%  Weight:      Height:        Intake/Output Summary (Last 24  hours) at 03/19/16 1205 Last data filed at 03/19/16 1158  Gross per 24 hour  Intake          2518.75 ml  Output             1700 ml  Net           818.75 ml   Filed Weights   03/17/16 1015  Weight: 85.3 kg (188 lb)    Examination: General exam: Appears calm and comfortable  Respiratory system: Clear to auscultation. Respiratory effort normal. Cardiovascular system: S1 & S2 heard, RRR. No JVD, murmurs, rubs, gallops or clicks. No pedal edema. Gastrointestinal system: Abdomen is nondistended, soft and nontender. No organomegaly or masses felt. Normal bowel sounds heard. Central nervous system: Alert and oriented. No focal neurological deficits. Extremities: Symmetric 5 x 5 power. Skin: No rashes, lesions or ulcers Psychiatry: Judgement and insight appear normal. Mood & affect appropriate.   Data Reviewed: I have personally reviewed following labs and imaging studies  CBC:  Recent Labs Lab 03/17/16 1107 03/18/16 0419 03/19/16 0522  WBC 14.1* 13.1* 9.2  NEUTROABS 9.7*  --   --   HGB 9.1* 9.7* 9.8*  HCT 27.7* 30.1* 30.3*  MCV 82.7 82.5 82.6  PLT 181 238 [id number]   Basic Metabolic Panel:  Recent Labs Lab 03/17/16 1107 03/18/16 0419 03/19/16 0522  NA 134* 136 138  K 3.4* 3.8 3.7  CL 104 106 105  CO2 21* 19* 23  GLUCOSE 122* 98 102*  BUN 8 7 9   CREATININE 0.67 0.68 0.68  CALCIUM 8.9 8.7* 9.0   GFR: Estimated Creatinine Clearance: 89 mL/min (by C-G formula based on SCr of 0.68 mg/dL). Liver Function Tests:  Recent Labs Lab 03/17/16 1107 03/18/16 0419  AST 13* 13*  ALT 16 13*  ALKPHOS 56 58  BILITOT 0.1* 0.2*  PROT 6.9 6.9  ALBUMIN 3.1* 2.8*   No results for input(s): LIPASE, AMYLASE in the last 168 hours. No results for input(s): AMMONIA in the last 168 hours. Coagulation Profile:  Recent Labs Lab 03/17/16 1107 03/18/16 0419  INR 1.19 1.00   Cardiac Enzymes: No results for input(s): CKTOTAL, CKMB, CKMBINDEX, TROPONINI in the last 168 hours. BNP (last  3 results) No results for input(s): PROBNP in the last 8760 hours. HbA1C: No results for input(s): HGBA1C in the last 72 hours. CBG: No results for input(s): GLUCAP in the last 168 hours. Lipid Profile: No results for input(s): CHOL, HDL, LDLCALC, TRIG, CHOLHDL, LDLDIRECT in the last 72 hours. Thyroid Function Tests: No results for input(s): TSH, T4TOTAL, FREET4, T3FREE, THYROIDAB in the last 72 hours. Anemia Panel: No results for input(s): VITAMINB12, FOLATE, FERRITIN, TIBC, IRON, RETICCTPCT in the last 72 hours. Urine analysis:    Component Value Date/Time   COLORURINE YELLOW 03/17/2016 1238   APPEARANCEUR HAZY (A) 03/17/2016 1238   LABSPEC 1.012 03/17/2016 1238   PHURINE 6.0 03/17/2016 1238   GLUCOSEU NEGATIVE 03/17/2016 1238   HGBUR NEGATIVE 03/17/2016 1238   BILIRUBINUR NEGATIVE 03/17/2016 1238   KETONESUR NEGATIVE 03/17/2016 1238   PROTEINUR NEGATIVE  03/17/2016 1238   UROBILINOGEN 0.2 10/18/2014 0100   NITRITE NEGATIVE 03/17/2016 1238   LEUKOCYTESUR SMALL (A) 03/17/2016 1238   Sepsis Labs: @LABRCNTIP (procalcitonin:4,lacticidven:4)  ) Recent Results (from the past 240 hour(s))  Blood culture (routine x 2)     Status: Abnormal (Preliminary result)   Collection Time: 03/17/16 11:07 AM  Result Value Ref Range Status   Specimen Description BLOOD LEFT ANTECUBITAL  Final   Special Requests BOTTLES DRAWN AEROBIC AND ANAEROBIC 5CC  Final   Culture  Setup Time   Final    GRAM POSITIVE COCCI IN CLUSTERS ANAEROBIC BOTTLE ONLY CRITICAL RESULT CALLED TO, READ BACK BY AND VERIFIED WITH: E MARTIN 03/18/16 @ 0956 M VESTAL GRAM POSITIVE RODS AEROBIC BOTTLE ONLY CRITICAL RESULT CALLED TO, READ BACK BY AND VERIFIED WITH: E. MARTIN PHARMD, AT 123012/7/17 BY D. VANHOOK    Culture (A)  Final    STAPHYLOCOCCUS AUREUS SUSCEPTIBILITIES TO FOLLOW DIPHTHEROIDS(CORYNEBACTERIUM SPECIES) Standardized susceptibility testing for this organism is not available.    Report Status PENDING   Incomplete  Blood Culture ID Panel (Reflexed)     Status: Abnormal   Collection Time: 03/17/16 11:07 AM  Result Value Ref Range Status   Enterococcus species NOT DETECTED NOT DETECTED Final   Listeria monocytogenes NOT DETECTED NOT DETECTED Final   Staphylococcus species DETECTED (A) NOT DETECTED Final    Comment: CRITICAL RESULT CALLED TO, READ BACK BY AND VERIFIED WITH: E MARTIN 03/18/16 @ 0956 M VESTAL    Staphylococcus aureus DETECTED (A) NOT DETECTED Final    Comment: CRITICAL RESULT CALLED TO, READ BACK BY AND VERIFIED WITH: E MARTIN 03/18/16 @ 0956 M VESTAL    Methicillin resistance DETECTED (A) NOT DETECTED Final    Comment: CRITICAL RESULT CALLED TO, READ BACK BY AND VERIFIED WITH: E MARTIN 03/18/16 @ 0956 M VESTAL    Streptococcus species NOT DETECTED NOT DETECTED Final   Streptococcus agalactiae NOT DETECTED NOT DETECTED Final   Streptococcus pneumoniae NOT DETECTED NOT DETECTED Final   Streptococcus pyogenes NOT DETECTED NOT DETECTED Final   Acinetobacter baumannii NOT DETECTED NOT DETECTED Final   Enterobacteriaceae species NOT DETECTED NOT DETECTED Final   Enterobacter cloacae complex NOT DETECTED NOT DETECTED Final   Escherichia coli NOT DETECTED NOT DETECTED Final   Klebsiella oxytoca NOT DETECTED NOT DETECTED Final   Klebsiella pneumoniae NOT DETECTED NOT DETECTED Final   Proteus species NOT DETECTED NOT DETECTED Final   Serratia marcescens NOT DETECTED NOT DETECTED Final   Haemophilus influenzae NOT DETECTED NOT DETECTED Final   Neisseria meningitidis NOT DETECTED NOT DETECTED Final   Pseudomonas aeruginosa NOT DETECTED NOT DETECTED Final   Candida albicans NOT DETECTED NOT DETECTED Final   Candida glabrata NOT DETECTED NOT DETECTED Final   Candida krusei NOT DETECTED NOT DETECTED Final   Candida parapsilosis NOT DETECTED NOT DETECTED Final   Candida tropicalis NOT DETECTED NOT DETECTED Final  Blood Culture ID Panel (Reflexed)     Status: None   Collection Time:  03/17/16 11:07 AM  Result Value Ref Range Status   Enterococcus species NOT DETECTED NOT DETECTED Final   Listeria monocytogenes NOT DETECTED NOT DETECTED Final   Staphylococcus species NOT DETECTED NOT DETECTED Final   Staphylococcus aureus NOT DETECTED NOT DETECTED Final   Streptococcus species NOT DETECTED NOT DETECTED Final   Streptococcus agalactiae NOT DETECTED NOT DETECTED Final   Streptococcus pneumoniae NOT DETECTED NOT DETECTED Final   Streptococcus pyogenes NOT DETECTED NOT DETECTED Final   Acinetobacter baumannii NOT DETECTED  NOT DETECTED Final   Enterobacteriaceae species NOT DETECTED NOT DETECTED Final   Enterobacter cloacae complex NOT DETECTED NOT DETECTED Final   Escherichia coli NOT DETECTED NOT DETECTED Final   Klebsiella oxytoca NOT DETECTED NOT DETECTED Final   Klebsiella pneumoniae NOT DETECTED NOT DETECTED Final   Proteus species NOT DETECTED NOT DETECTED Final   Serratia marcescens NOT DETECTED NOT DETECTED Final   Haemophilus influenzae NOT DETECTED NOT DETECTED Final   Neisseria meningitidis NOT DETECTED NOT DETECTED Final   Pseudomonas aeruginosa NOT DETECTED NOT DETECTED Final   Candida albicans NOT DETECTED NOT DETECTED Final   Candida glabrata NOT DETECTED NOT DETECTED Final   Candida krusei NOT DETECTED NOT DETECTED Final   Candida parapsilosis NOT DETECTED NOT DETECTED Final   Candida tropicalis NOT DETECTED NOT DETECTED Final  Urine culture     Status: Abnormal   Collection Time: 03/17/16 12:38 PM  Result Value Ref Range Status   Specimen Description URINE, RANDOM  Final   Special Requests NONE  Final   Culture MULTIPLE SPECIES PRESENT, SUGGEST RECOLLECTION (A)  Final   Report Status 03/18/2016 FINAL  Final  Blood culture (routine x 2)     Status: None (Preliminary result)   Collection Time: 03/17/16  1:30 PM  Result Value Ref Range Status   Specimen Description BLOOD RIGHT WRIST  Final   Special Requests IN PEDIATRIC BOTTLE 2CC  Final   Culture  NO GROWTH 2 DAYS  Final   Report Status PENDING  Incomplete  Culture, blood (Routine X 2) w Reflex to ID Panel     Status: None (Preliminary result)   Collection Time: 03/18/16  2:06 PM  Result Value Ref Range Status   Specimen Description BLOOD RIGHT HAND  Final   Special Requests IN PEDIATRIC BOTTLE 1CC  Final   Culture NO GROWTH < 24 HOURS  Final   Report Status PENDING  Incomplete     Invalid input(s): PROCALCITONIN, LACTICACIDVEN   Radiology Studies: X-ray Chest Pa And Lateral  Result Date: 03/18/2016 CLINICAL DATA:  Weakness and chest congestion without cough or fever. PICC line removal yesterday. History of COPD, current smoker, pulmonary embolism, hepatitis-C, IV drug use and polysubstance abuse. EXAM: CHEST  2 VIEW COMPARISON:  Portable chest x-ray of March 17, 2016 FINDINGS: The lungs are adequately inflated. There is mild interstitial prominence in the right lower lung with new small right pleural effusion. There is a trace of pleural fluid on the left as well. No infiltrate on the left is demonstrated. The heart and pulmonary vascularity are normal. The mediastinum is normal in width. The bony thorax exhibits no acute abnormality. IMPRESSION: Probable atelectasis at the right lung base. Small right pleural effusion and trace left pleural effusion. No discrete alveolar pneumonia. No CHF. Given the patient's clinical history and findings, follow-up chest x-ray in 2-3 weeks following anticipated antibiotic therapy is recommended to assure clearing Electronically Signed   By: David  Martinique M.D.   On: 03/18/2016 08:18   Dg Chest Port 1 View  Result Date: 03/17/2016 CLINICAL DATA:  Pt c/o cough and SOB x 1 week. Pt has a wound vac in the center of her chest. MD states PICC line is going to come out. Hx of COPD, PE, AND hepatitis C. Pt is a current smoker. EXAM: PORTABLE CHEST 1 VIEW COMPARISON:  Earlier today at 1233 hours FINDINGS: 1326 hours. Interval removal of left-sided PICC line.  Numerous leads and wires project over the chest. Midline trachea. Normal heart  size. Remote posterior lateral left sixth rib trauma. Improvement in right upper lobe airspace disease. IMPRESSION: Improvement in right upper lobe airspace disease, which remains suspicious for pneumonia. Removal of left PICC line. Electronically Signed   By: Abigail Miyamoto M.D.   On: 03/17/2016 13:35   Dg Chest Port 1 View  Result Date: 03/17/2016 CLINICAL DATA:  Redness around the wound on central chest. Evaluate PICC line. Shortness breath and cough. EXAM: PORTABLE CHEST 1 VIEW COMPARISON:  05/03/2015.  Chest CT 02/21/2016. FINDINGS: Left PICC line is in place with the tip in the upper right atrium approximately 3 cm below the cavoatrial junction. Heart is normal size. Left lung is clear. Airspace disease in the right upper lobe concerning for pneumonia. No effusions. IMPRESSION: Left PICC line tip in the right atrium 3 cm below the cavoatrial junction. Right upper lobe opacity concerning for pneumonia. Electronically Signed   By: Rolm Baptise M.D.   On: 03/17/2016 12:38        Scheduled Meds: . ceFEPime (MAXIPIME) IV  2 g Intravenous Q12H  . heparin  5,000 Units Subcutaneous Q8H  . sodium chloride flush  3 mL Intravenous Q12H  . vancomycin  1,000 mg Intravenous Q8H   Continuous Infusions: . sodium chloride 500 mL (03/18/16 1251)     LOS: 2 days    Time spent: 35 minutes    Ailana Cuadrado A, MD [facility] Pager [phone]  If 7PM-7AM, please contact night-coverage [email or web address] Password [email or web address] 03/19/2016, 12:05 PM

## 2016-03-20 LAB — CULTURE, BLOOD (ROUTINE X 2)

## 2016-03-20 LAB — HEPATITIS C VRS RNA DETECT BY PCR-QUAL: Hepatitis C Vrs RNA by PCR-Qual: POSITIVE — AB

## 2016-03-20 LAB — HEPATITIS C GENOTYPE

## 2016-03-20 NOTE — Progress Notes (Signed)
PROGRESS NOTE  [name]  [id number] DOB: March 02, 1971 DOA: 03/17/2016 PCP: [name] Patient Outpatient Specialists:  Subjective: Complains about some pain around her sternal wound, otherwise no complaints  Brief Narrative:  [name] is a 45 y.o. female with a history of IV drug use (primarily heroin), hepatitis C recently admitted from 11/11-11/18  For septic arthritis and endocarditis of the R sternoclavicular joint with sternoclavicular abscess incised and drained on 02/22/2016, subsequent VAC placed on November 14. She was discharged from here November 18 to [place] home   D/c grew pseudomonas and comanomas -ID recommended cefepime 2 g every 12 hours for total of 6 weeks, end date was to be 04/05/2016 but she left the facility 5 days ago AMA, and without antibiotics. She kept the PICC line  Last IV drug use with heroine was 2 days ago. She is now presenting with chest pain, and pain in the dorsal aspect of her hand, suspicious of infection.. Denies any fever or chills night sweats, nausea or vomiting. No diarrhea. No lower extremity swelling. She does complain of chest wall pain, and these drainage from her when the incision on the) sternal area. CVTS and ID  are to see the patient. 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 -Recently discharged to nursing home with PICC line on cefepime. -She  did have Comamonas bacteremia and Pseudomonas wound infection. -Left from the SNF AMA, admitted that she done heroin and cocaine in the interim. -Continue cefepime and vancomycin  MRSA bacteremia -2/2 blood cultures showing MRSA, PICC line removed. 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 -ID on board, appreciate their help  Tricuspid valve endocarditis  -TEE doneon 11/13and showed small vegetation involving the septal leaflet of the tricuspid valve. Vegetation measures 0.80.2 cm without evidence of tricuspid valve deterioration.   Septic pulmonary embolism Recent CT showed  2 nodules one of 1.7 cm and the other 0.6 cm suspicious for septic emboli in the RML. TEE done and showed tricuspid valve endocarditis, septic to the lungs.  New CXR shows in addition possible RUL PNA.   Anemia, normocytic  Hemoglobin on admission 9.1, stable at baseline. No overt bleeding  Repeat CBC in am  History of IV drug use with heroin Continue  Bentyl, Imodium and clonidine as needed. Careful use of pain meds   Hepatitis C Appears to be has chronic hepatitis, no transaminitis or coagulopathy. HIV nonreactive. Follow-up with ID clinic as outpatient.  Polysubstance abuse, IVDU as above Will need OP counseling    DVT prophylaxis:  Code Status: Full Code Family Communication:  Disposition Plan:  Diet: Diet Heart Room service appropriate? Yes; Fluid consistency: Thin  Consultants:   ID  Procedures:   None  Antimicrobials:   Cefepime and vancomycin.   Objective: Vitals:   03/19/16 0454 03/19/16 1351 03/19/16 2043  03/20/16 0453  BP: 123/69 122/76 138/68 (!) 156/88  Pulse: 71 73 80 84  Resp: 18 20 19 19   Temp: 98.1 F (36.7 C) 97.8 F (36.6 C) 98.4 F (36.9 C) 98.6 F (37 C)  TempSrc: Oral Oral    SpO2: 96% 100% 98% 95%  Weight:      Height:        Intake/Output Summary (Last 24 hours) at 03/20/16 1234 Last data  filed at 03/20/16 0910  Gross per 24 hour  Intake             2720 ml  Output                0 ml  Net             2720 ml   Filed Weights   03/17/16 1015  Weight: 85.3 kg (188 lb)    Examination: General exam: Appears calm and comfortable  Respiratory system: Clear to auscultation. Respiratory effort normal. Cardiovascular system: S1 & S2 heard, RRR. No JVD, murmurs, rubs, gallops or clicks. No pedal edema. Gastrointestinal system: Abdomen is nondistended, soft and nontender. No organomegaly or masses felt. Normal bowel sounds heard. Central nervous system: Alert and oriented. No focal neurological deficits. Extremities: Symmetric 5 x 5 power. Skin: No rashes, lesions or ulcers Psychiatry: Judgement and insight appear normal. Mood & affect appropriate.   Data Reviewed: I have personally reviewed following labs and imaging studies  CBC:  Recent Labs Lab 03/17/16 1107 03/18/16 0419 03/19/16 0522  WBC 14.1* 13.1* 9.2  NEUTROABS 9.7*  --   --   HGB 9.1* 9.7* 9.8*  HCT 27.7* 30.1* 30.3*  MCV 82.7 82.5 82.6  PLT 181 238 [id number]   Basic Metabolic Panel:  Recent Labs Lab 03/17/16 1107 03/18/16 0419 03/19/16 0522  NA 134* 136 138  K 3.4* 3.8 3.7  CL 104 106 105  CO2 21* 19* 23  GLUCOSE 122* 98 102*  BUN 8 7 9   CREATININE 0.67 0.68 0.68  CALCIUM 8.9 8.7* 9.0   GFR: Estimated Creatinine Clearance: 89 mL/min (by C-G formula based on SCr of 0.68 mg/dL). Liver Function Tests:  Recent Labs Lab 03/17/16 1107 03/18/16 0419  AST 13* 13*  ALT 16 13*  ALKPHOS 56 58  BILITOT 0.1* 0.2*  PROT 6.9 6.9  ALBUMIN 3.1* 2.8*   No results for input(s): LIPASE, AMYLASE in the last 168 hours. No results for input(s): AMMONIA in the last 168 hours. Coagulation Profile:  Recent Labs Lab 03/17/16 1107 03/18/16 0419  INR 1.19 1.00   Cardiac Enzymes: No results for input(s): CKTOTAL, CKMB, CKMBINDEX, TROPONINI in the last 168 hours. BNP (last 3 results) No results for  input(s): PROBNP in the last 8760 hours. HbA1C: No results for input(s): HGBA1C in the last 72 hours. CBG: No results for input(s): GLUCAP in the last 168 hours. Lipid Profile: No results for input(s): CHOL, HDL, LDLCALC, TRIG, CHOLHDL, LDLDIRECT in the last 72 hours. Thyroid Function Tests: No results for input(s): TSH, T4TOTAL, FREET4, T3FREE, THYROIDAB in the last 72 hours. Anemia Panel: No results for input(s): VITAMINB12, FOLATE, FERRITIN, TIBC, IRON, RETICCTPCT in the last 72 hours. Urine analysis:    Component Value Date/Time   COLORURINE YELLOW 03/17/2016 1238   APPEARANCEUR HAZY (A) 03/17/2016 1238   LABSPEC 1.012 03/17/2016 1238   PHURINE 6.0 03/17/2016 1238   GLUCOSEU NEGATIVE 03/17/2016 1238   HGBUR NEGATIVE 03/17/2016 [place] 03/17/2016 [place] 03/17/2016  [place] 03/17/2016 1238   UROBILINOGEN 0.2 10/18/2014 0100   NITRITE NEGATIVE 03/17/2016 1238   LEUKOCYTESUR SMALL (A) 03/17/2016 1238   Sepsis Labs: @LABRCNTIP (procalcitonin:4,lacticidven:4)  ) Recent Results (from the past 240 hour(s))  Blood culture (routine x 2)     Status: Abnormal   Collection Time: 03/17/16 11:07 AM  Result Value Ref Range Status   Specimen Description BLOOD LEFT ANTECUBITAL  Final   Special Requests BOTTLES DRAWN AEROBIC AND ANAEROBIC 5CC  Final   Culture  Setup Time   Final    GRAM POSITIVE COCCI IN CLUSTERS ANAEROBIC BOTTLE ONLY CRITICAL RESULT CALLED TO, READ BACK BY AND VERIFIED WITH: E MARTIN 03/18/16 @ 0956 M VESTAL GRAM POSITIVE RODS AEROBIC BOTTLE ONLY CRITICAL RESULT CALLED TO, READ BACK BY AND VERIFIED WITH: E. MARTIN PHARMD, AT 123012/7/17 BY D. VANHOOK    Culture (A)  Final    METHICILLIN RESISTANT STAPHYLOCOCCUS AUREUS DIPHTHEROIDS(CORYNEBACTERIUM SPECIES) Standardized susceptibility testing for this organism is not available.    Report Status 03/20/2016 FINAL  Final   Organism ID, Bacteria METHICILLIN  RESISTANT STAPHYLOCOCCUS AUREUS  Final      Susceptibility   Methicillin resistant staphylococcus aureus - MIC*    CIPROFLOXACIN >=8 RESISTANT Resistant     ERYTHROMYCIN >=8 RESISTANT Resistant     GENTAMICIN <=0.5 SENSITIVE Sensitive     OXACILLIN >=4 RESISTANT Resistant     TETRACYCLINE <=1 SENSITIVE Sensitive     VANCOMYCIN <=0.5 SENSITIVE Sensitive     TRIMETH/SULFA <=10 SENSITIVE Sensitive     CLINDAMYCIN >=8 RESISTANT Resistant     RIFAMPIN <=0.5 SENSITIVE Sensitive     Inducible Clindamycin NEGATIVE Sensitive     * METHICILLIN RESISTANT STAPHYLOCOCCUS AUREUS  Blood Culture ID Panel (Reflexed)     Status: Abnormal   Collection Time: 03/17/16 11:07 AM  Result Value Ref Range Status   Enterococcus species NOT DETECTED NOT DETECTED Final   Listeria monocytogenes NOT DETECTED NOT DETECTED Final   Staphylococcus species DETECTED (A) NOT DETECTED Final    Comment: CRITICAL RESULT CALLED TO, READ BACK BY AND VERIFIED WITH: E MARTIN 03/18/16 @ 0956 M VESTAL    Staphylococcus aureus DETECTED (A) NOT DETECTED Final    Comment: CRITICAL RESULT CALLED TO, READ BACK BY AND VERIFIED WITH: E MARTIN 03/18/16 @ 0956 M VESTAL    Methicillin resistance DETECTED (A) NOT DETECTED Final    Comment: CRITICAL RESULT CALLED TO, READ BACK BY AND VERIFIED WITH: E MARTIN 03/18/16 @ 0956 M VESTAL    Streptococcus species NOT DETECTED NOT DETECTED Final   Streptococcus agalactiae NOT DETECTED NOT DETECTED Final   Streptococcus pneumoniae NOT DETECTED NOT DETECTED Final   Streptococcus pyogenes NOT DETECTED NOT DETECTED Final   Acinetobacter baumannii NOT DETECTED NOT DETECTED Final   Enterobacteriaceae species NOT DETECTED NOT DETECTED Final   Enterobacter cloacae complex NOT DETECTED NOT DETECTED Final   Escherichia coli NOT DETECTED NOT DETECTED Final   Klebsiella oxytoca NOT DETECTED NOT DETECTED Final   Klebsiella pneumoniae NOT DETECTED NOT DETECTED Final   Proteus species NOT DETECTED NOT  DETECTED Final   Serratia marcescens NOT DETECTED NOT DETECTED Final   Haemophilus influenzae NOT DETECTED NOT DETECTED Final   Neisseria meningitidis NOT DETECTED NOT DETECTED Final   Pseudomonas aeruginosa NOT DETECTED NOT DETECTED Final   Candida albicans NOT DETECTED NOT DETECTED Final   Candida glabrata NOT DETECTED NOT DETECTED Final   Candida krusei NOT DETECTED NOT DETECTED Final   Candida parapsilosis NOT  DETECTED NOT DETECTED Final   Candida tropicalis NOT DETECTED NOT DETECTED Final  Blood Culture ID Panel (Reflexed)     Status: None   Collection Time: 03/17/16 11:07 AM  Result Value Ref Range Status   Enterococcus species NOT DETECTED NOT DETECTED Final   Listeria monocytogenes NOT DETECTED NOT DETECTED Final   Staphylococcus species NOT DETECTED NOT DETECTED Final   Staphylococcus aureus NOT DETECTED NOT DETECTED Final   Streptococcus species NOT DETECTED NOT DETECTED Final   Streptococcus agalactiae NOT DETECTED NOT DETECTED Final   Streptococcus pneumoniae NOT DETECTED NOT DETECTED Final   Streptococcus pyogenes NOT DETECTED NOT DETECTED Final   Acinetobacter baumannii NOT DETECTED NOT DETECTED Final   Enterobacteriaceae species NOT DETECTED NOT DETECTED Final   Enterobacter cloacae complex NOT DETECTED NOT DETECTED Final   Escherichia coli NOT DETECTED NOT DETECTED Final   Klebsiella oxytoca NOT DETECTED NOT DETECTED Final   Klebsiella pneumoniae NOT DETECTED NOT DETECTED Final   Proteus species NOT DETECTED NOT DETECTED Final   Serratia marcescens NOT DETECTED NOT DETECTED Final   Haemophilus influenzae NOT DETECTED NOT DETECTED Final   Neisseria meningitidis NOT DETECTED NOT DETECTED Final   Pseudomonas aeruginosa NOT DETECTED NOT DETECTED Final   Candida albicans NOT DETECTED NOT DETECTED Final   Candida glabrata NOT DETECTED NOT DETECTED Final   Candida krusei NOT DETECTED NOT DETECTED Final   Candida parapsilosis NOT DETECTED NOT DETECTED Final   Candida  tropicalis NOT DETECTED NOT DETECTED Final  Urine culture     Status: Abnormal   Collection Time: 03/17/16 12:38 PM  Result Value Ref Range Status   Specimen Description URINE, RANDOM  Final   Special Requests NONE  Final   Culture MULTIPLE SPECIES PRESENT, SUGGEST RECOLLECTION (A)  Final   Report Status 03/18/2016 FINAL  Final  Blood culture (routine x 2)     Status: None (Preliminary result)   Collection Time: 03/17/16  1:30 PM  Result Value Ref Range Status   Specimen Description BLOOD RIGHT WRIST  Final   Special Requests IN PEDIATRIC BOTTLE 2CC  Final   Culture NO GROWTH 2 DAYS  Final   Report Status PENDING  Incomplete  Culture, blood (Routine X 2) w Reflex to ID Panel     Status: None (Preliminary result)   Collection Time: 03/18/16  2:06 PM  Result Value Ref Range Status   Specimen Description BLOOD RIGHT HAND  Final   Special Requests IN PEDIATRIC BOTTLE 1CC  Final   Culture NO GROWTH < 24 HOURS  Final   Report Status PENDING  Incomplete     Invalid input(s): PROCALCITONIN, LACTICACIDVEN   Radiology Studies: No results found.      Scheduled Meds: . ceFEPime (MAXIPIME) IV  2 g Intravenous Q12H  . heparin  5,000 Units Subcutaneous Q8H  . sodium chloride flush  3 mL Intravenous Q12H  . vancomycin  1,000 mg Intravenous Q8H   Continuous Infusions: . sodium chloride 1,000 mL (03/19/16 1827)     LOS: 3 days    Time spent: 35 minutes    Haven Foss A, MD [facility] Pager [phone]  If 7PM-7AM, please contact night-coverage [email or web address] Password [facility] 03/20/2016, 12:34 PM

## 2016-03-21 LAB — VANCOMYCIN, TROUGH: Vancomycin Tr: 19 ug/mL (ref 15–20)

## 2016-03-21 NOTE — Progress Notes (Signed)
Paged provider to make aware that CCMD called that pt went from heart rate in the 40's to 110's. With a missed beat. Vitals stable with no pain. Will continue to monitor patient.  Provider ordered EKG and followed up. HR in 70's since visitor left. Will continue to monitor.

## 2016-03-21 NOTE — Progress Notes (Signed)
Pharmacy Antibiotic Note  [name] is a 45 y.o. female admitted on 03/17/2016 with endocarditis.  Pharmacy has been consulted for vancomycin/cefepime dosing.   Today is D5 of therapy for MRSA bacteremia, sternal osteomyelitis, and endocarditis. Patient was recently hospitalized for septic arthritis/endocarditis and transferred to SNF s/p sternal I&D. She left AMA, and was readmitted 12/6 for treatment.    She is currently afebrile, with a normal wbc. Vancomycin trough collected slightly early yesterday was therapeutic at ~19. Given her clinical improvement, stable renal function, and therapeutic trough, will continue current antibiotic regimen.   Plan: Continue Vancomycin 1g IV every 8 hours.    Continue cefepime 2g IV q12h Monitor culture data, renal function and clinical course  Height: 5\' 1"  (154.9 cm) Weight: 188 lb (85.3 kg) IBW/kg (Calculated) : 47.8  Temp (24hrs), Avg:97.9 F (36.6 C), Min:97.4 F (36.3 C), Max:98.2 F (36.8 C)   Recent Labs Lab 03/17/16 1107 03/17/16 1119 03/17/16 1335 03/18/16 0419 03/19/16 0522 03/21/16 0551  WBC 14.1*  --   --  13.1* 9.2  --   CREATININE 0.67  --   --  0.68 0.68  --   LATICACIDVEN  --  0.45* 1.31  --   --   --   VANCOTROUGH  --   --   --   --   --  19    Estimated Creatinine Clearance: 89 mL/min (by C-G formula based on SCr of 0.68 mg/dL).    Allergies  Allergen Reactions  . Doxycycline Other (See Comments)    burns  . Mango Flavor Hives and Swelling  . Pyridium [Phenazopyridine Hcl] Other (See Comments)    Irritation to skin  . Tramadol Nausea And Vomiting    Antimicrobials this admission:  Cefepime restart 12/6 >> Vanc 12/6 >>   Abx last admit Zosyn 11/11 >> 11/12; restart 11/13>> 11/15 Vanc 11/11 >> 11/13 CTX 11/12 >> 11/13 Cefepime 11/13 >>11/13; restart 11/15  Microbiology results:  12/7 blood: NG x1 day 12/6 BCx: MRSA, diptheroids 12/6 BCID: MRSA 12/6 UCx: multiple 11/11 BCx: Comamonas acidovorans  (pan-S EXCEPT I-Cefaz, R-Gent) 11/11 BCID: negative 11/12 Abscess cx (intra-op): Pseudomonas, pan sens 11/12 MRSA PCR: neg  Thank you for allowing pharmacy to be a part of this patient's care.  Dierdre Harness, Cain Sieve, PharmD Clinical Pharmacy Resident [phone] (Pager) 03/21/2016 2:08 PM

## 2016-03-21 NOTE — Progress Notes (Signed)
PROGRESS NOTE  [name]  [id number] DOB: 09/14/1970 DOA: 03/17/2016 PCP: [name] Patient Outpatient Specialists:  Subjective: No complaints today, heart rate went up to 110, EKG was done and showed a heart rate of 70, normal sinus rhythm. Continue current management no changes clinically.  Brief Narrative:  [name] is a 45 y.o. female with a history of IV drug use (primarily heroin), hepatitis C recently admitted from 11/11-11/18  For septic arthritis and endocarditis of the R sternoclavicular joint with sternoclavicular abscess incised and drained on 02/22/2016, subsequent VAC placed on November 14. She was discharged from here November 18 to [facility]   D/c grew pseudomonas and comanomas -ID recommended cefepime 2 g every 12 hours for total of 6 weeks, end date was to be 04/05/2016 but she left the facility 5 days ago AMA, and without antibiotics. She kept the PICC line  Last IV drug use with heroine was 2 days ago. She is now presenting with chest pain, and pain in the dorsal aspect of her hand, suspicious of infection.. Denies any fever or chills night sweats, nausea or vomiting. No diarrhea. No lower extremity swelling. She does complain of chest wall pain, and these drainage from her when the incision on the) sternal area. CVTS and ID  are to see the patient. 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 -Recently discharged to nursing home with PICC line on cefepime. -She did have Comamonas bacteremia and Pseudomonas wound infection. -Left from the SNF AMA, admitted that she done heroin and cocaine in the interim. -Continue cefepime and vancomycin  MRSA bacteremia -2/2 blood cultures showing MRSA, PICC line removed. 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 -ID on board, appreciate their help  Tricuspid valve endocarditis  -TEE doneon 11/13and showed small vegetation involving the septal leaflet of the tricuspid valve. Vegetation measures 0.80.2 cm without evidence of tricuspid valve deterioration.   Septic pulmonary embolism Recent CT showed  2 nodules one of 1.7 cm and the other 0.6 cm suspicious for septic emboli in the RML. TEE done and showed tricuspid valve endocarditis, septic to the lungs.  New CXR shows in addition possible RUL PNA.   Anemia, normocytic  Hemoglobin on admission 9.1, stable at baseline. No overt bleeding  Repeat CBC in am  History of IV drug use with heroin Continue  Bentyl, Imodium and clonidine as needed. Careful use of pain meds   Hepatitis C Appears to be has chronic hepatitis, no transaminitis or coagulopathy. HIV nonreactive. Follow-up with ID clinic as outpatient.  Polysubstance abuse, IVDU as above Will need OP counseling    DVT prophylaxis:  Code Status: Full Code Family Communication:  Disposition Plan:  Diet: Diet regular Room service appropriate? Yes; Fluid consistency: Thin  Consultants:   ID  Procedures:   None  Antimicrobials:  Cefepime and vancomycin.   Objective: Vitals:   03/20/16 2102 03/21/16 0442 03/21/16 1004 03/21/16 1030  BP: (!) 110/58 132/79 131/73   Pulse: 64 60 84 75  Resp: 19 19 18    Temp: 98.2 F (36.8 C) 98.1 F (36.7 C) 97.4 F (36.3 C)   TempSrc:   Oral   SpO2: 95% 97% 100%   Weight:      Height:         Intake/Output Summary (Last 24 hours) at 03/21/16 1122 Last data filed at 03/21/16 0647  Gross per 24 hour  Intake             1470 ml  Output                0 ml  Net             1470 ml   Filed Weights   03/17/16 1015  Weight: 85.3 kg (188 lb)    Examination: General exam: Appears calm and comfortable  Respiratory system: Clear to auscultation. Respiratory effort normal. Cardiovascular system: S1 & S2 heard, RRR. No JVD, murmurs, rubs, gallops or clicks. No pedal edema. Gastrointestinal system: Abdomen is nondistended, soft and nontender. No organomegaly or masses felt. Normal bowel sounds heard. Central nervous system: Alert and oriented. No focal neurological deficits. Extremities: Symmetric 5 x 5 power. Skin: No rashes, lesions or ulcers Psychiatry: Judgement and insight appear normal. Mood & affect appropriate.   Data Reviewed: I have personally reviewed following labs and imaging studies  CBC:  Recent Labs Lab 03/17/16 1107 03/18/16 0419 03/19/16 0522  WBC 14.1* 13.1* 9.2  NEUTROABS 9.7*  --   --   HGB 9.1* 9.7* 9.8*  HCT 27.7* 30.1* 30.3*  MCV 82.7 82.5 82.6  PLT 181 238 [id number]   Basic Metabolic Panel:  Recent Labs Lab 03/17/16 1107 03/18/16 0419 03/19/16 0522  NA 134* 136 138  K 3.4* 3.8 3.7  CL 104 106 105  CO2 21* 19* 23  GLUCOSE 122* 98 102*  BUN 8 7 9   CREATININE 0.67 0.68 0.68  CALCIUM 8.9 8.7* 9.0   GFR: Estimated Creatinine Clearance: 89 mL/min (by C-G formula based on SCr of 0.68 mg/dL). Liver Function Tests:  Recent Labs Lab 03/17/16 1107 03/18/16 0419  AST 13* 13*  ALT 16 13*  ALKPHOS 56 58  BILITOT 0.1* 0.2*  PROT 6.9 6.9  ALBUMIN 3.1* 2.8*   No results for input(s): LIPASE, AMYLASE in the last 168 hours. No results for input(s): AMMONIA in the last 168 hours. Coagulation Profile:  Recent Labs Lab 03/17/16 1107 03/18/16 0419  INR 1.19 1.00   Cardiac Enzymes: No results for input(s): CKTOTAL, CKMB, CKMBINDEX,  TROPONINI in the last 168 hours. BNP (last 3 results) No results for input(s): PROBNP in the last 8760 hours. HbA1C: No results for input(s): HGBA1C in the last 72 hours. CBG: No results for input(s): GLUCAP in the last 168 hours. Lipid Profile: No results for input(s): CHOL, HDL, LDLCALC, TRIG, CHOLHDL, LDLDIRECT in the last 72 hours. Thyroid Function Tests: No results for input(s): TSH, T4TOTAL, FREET4, T3FREE, THYROIDAB in the last 72 hours. Anemia Panel: No results for input(s): VITAMINB12, FOLATE, FERRITIN, TIBC, IRON, RETICCTPCT in the last 72 hours. Urine analysis:    Component Value Date/Time   COLORURINE YELLOW 03/17/2016 1238   APPEARANCEUR HAZY (A) 03/17/2016 1238   LABSPEC 1.012 03/17/2016 1238   PHURINE 6.0 03/17/2016 1238   GLUCOSEU NEGATIVE 03/17/2016 1238   HGBUR NEGATIVE 03/17/2016  [place] 03/17/2016 [place] 03/17/2016 1238   PROTEINUR NEGATIVE 03/17/2016 1238   UROBILINOGEN 0.2 10/18/2014 0100   NITRITE NEGATIVE 03/17/2016 1238   LEUKOCYTESUR SMALL (A) 03/17/2016 1238   Sepsis Labs: @LABRCNTIP (procalcitonin:4,lacticidven:4)  ) Recent Results (from the past 240 hour(s))  Blood culture (routine x 2)     Status: Abnormal   Collection Time: 03/17/16 11:07 AM  Result Value Ref Range Status   Specimen Description BLOOD LEFT ANTECUBITAL  Final   Special Requests BOTTLES DRAWN AEROBIC AND ANAEROBIC 5CC  Final   Culture  Setup Time   Final    GRAM POSITIVE COCCI IN CLUSTERS ANAEROBIC BOTTLE ONLY CRITICAL RESULT CALLED TO, READ BACK BY AND VERIFIED WITH: E MARTIN 03/18/16 @ 0956 M VESTAL GRAM POSITIVE RODS AEROBIC BOTTLE ONLY CRITICAL RESULT CALLED TO, READ BACK BY AND VERIFIED WITH: E. MARTIN PHARMD, AT 123012/7/17 BY D. VANHOOK    Culture (A)  Final    METHICILLIN RESISTANT STAPHYLOCOCCUS AUREUS DIPHTHEROIDS(CORYNEBACTERIUM SPECIES) Standardized susceptibility testing for this organism is not available.    Report  Status 03/20/2016 FINAL  Final   Organism ID, Bacteria METHICILLIN RESISTANT STAPHYLOCOCCUS AUREUS  Final      Susceptibility   Methicillin resistant staphylococcus aureus - MIC*    CIPROFLOXACIN >=8 RESISTANT Resistant     ERYTHROMYCIN >=8 RESISTANT Resistant     GENTAMICIN <=0.5 SENSITIVE Sensitive     OXACILLIN >=4 RESISTANT Resistant     TETRACYCLINE <=1 SENSITIVE Sensitive     VANCOMYCIN <=0.5 SENSITIVE Sensitive     TRIMETH/SULFA <=10 SENSITIVE Sensitive     CLINDAMYCIN >=8 RESISTANT Resistant     RIFAMPIN <=0.5 SENSITIVE Sensitive     Inducible Clindamycin NEGATIVE Sensitive     * METHICILLIN RESISTANT STAPHYLOCOCCUS AUREUS  Blood Culture ID Panel (Reflexed)     Status: Abnormal   Collection Time: 03/17/16 11:07 AM  Result Value Ref Range Status   Enterococcus species NOT DETECTED NOT DETECTED Final   Listeria monocytogenes NOT DETECTED NOT DETECTED Final   Staphylococcus species DETECTED (A) NOT DETECTED Final    Comment: CRITICAL RESULT CALLED TO, READ BACK BY AND VERIFIED WITH: E MARTIN 03/18/16 @ 0956 M VESTAL    Staphylococcus aureus DETECTED (A) NOT DETECTED Final    Comment: CRITICAL RESULT CALLED TO, READ BACK BY AND VERIFIED WITH: E MARTIN 03/18/16 @ 0956 M VESTAL    Methicillin resistance DETECTED (A) NOT DETECTED Final    Comment: CRITICAL RESULT CALLED TO, READ BACK BY AND VERIFIED WITH: E MARTIN 03/18/16 @ 0956 M VESTAL    Streptococcus species NOT DETECTED NOT DETECTED Final   Streptococcus agalactiae NOT DETECTED NOT DETECTED Final   Streptococcus pneumoniae NOT DETECTED NOT DETECTED Final   Streptococcus pyogenes NOT DETECTED NOT DETECTED Final   Acinetobacter baumannii NOT DETECTED NOT DETECTED Final   Enterobacteriaceae species NOT DETECTED NOT DETECTED Final   Enterobacter cloacae complex NOT DETECTED NOT DETECTED Final   Escherichia coli NOT DETECTED NOT DETECTED Final   Klebsiella oxytoca NOT DETECTED NOT DETECTED Final   Klebsiella pneumoniae NOT  DETECTED NOT DETECTED Final   Proteus species NOT DETECTED NOT DETECTED Final   Serratia marcescens NOT DETECTED NOT DETECTED Final   Haemophilus influenzae NOT DETECTED NOT DETECTED Final   Neisseria meningitidis NOT DETECTED NOT DETECTED Final   Pseudomonas aeruginosa NOT DETECTED NOT DETECTED Final   Candida albicans NOT DETECTED NOT DETECTED Final   Candida glabrata NOT DETECTED NOT DETECTED Final  Candida krusei NOT DETECTED NOT DETECTED Final   Candida parapsilosis NOT DETECTED NOT DETECTED Final   Candida tropicalis NOT DETECTED NOT DETECTED Final  Blood Culture ID Panel (Reflexed)     Status: None   Collection Time: 03/17/16 11:07 AM  Result Value Ref Range Status   Enterococcus species NOT DETECTED NOT DETECTED Final   Listeria monocytogenes NOT DETECTED NOT DETECTED Final   Staphylococcus species NOT DETECTED NOT DETECTED Final   Staphylococcus aureus NOT DETECTED NOT DETECTED Final   Streptococcus species NOT DETECTED NOT DETECTED Final   Streptococcus agalactiae NOT DETECTED NOT DETECTED Final   Streptococcus pneumoniae NOT DETECTED NOT DETECTED Final   Streptococcus pyogenes NOT DETECTED NOT DETECTED Final   Acinetobacter baumannii NOT DETECTED NOT DETECTED Final   Enterobacteriaceae species NOT DETECTED NOT DETECTED Final   Enterobacter cloacae complex NOT DETECTED NOT DETECTED Final   Escherichia coli NOT DETECTED NOT DETECTED Final   Klebsiella oxytoca NOT DETECTED NOT DETECTED Final   Klebsiella pneumoniae NOT DETECTED NOT DETECTED Final   Proteus species NOT DETECTED NOT DETECTED Final   Serratia marcescens NOT DETECTED NOT DETECTED Final   Haemophilus influenzae NOT DETECTED NOT DETECTED Final   Neisseria meningitidis NOT DETECTED NOT DETECTED Final   Pseudomonas aeruginosa NOT DETECTED NOT DETECTED Final   Candida albicans NOT DETECTED NOT DETECTED Final   Candida glabrata NOT DETECTED NOT DETECTED Final   Candida krusei NOT DETECTED NOT DETECTED Final    Candida parapsilosis NOT DETECTED NOT DETECTED Final   Candida tropicalis NOT DETECTED NOT DETECTED Final  Urine culture     Status: Abnormal   Collection Time: 03/17/16 12:38 PM  Result Value Ref Range Status   Specimen Description URINE, RANDOM  Final   Special Requests NONE  Final   Culture MULTIPLE SPECIES PRESENT, SUGGEST RECOLLECTION (A)  Final   Report Status 03/18/2016 FINAL  Final  Blood culture (routine x 2)     Status: None (Preliminary result)   Collection Time: 03/17/16  1:30 PM  Result Value Ref Range Status   Specimen Description BLOOD RIGHT WRIST  Final   Special Requests IN PEDIATRIC BOTTLE 2CC  Final   Culture NO GROWTH 3 DAYS  Final   Report Status PENDING  Incomplete  Culture, blood (Routine X 2) w Reflex to ID Panel     Status: None (Preliminary result)   Collection Time: 03/18/16  2:06 PM  Result Value Ref Range Status   Specimen Description BLOOD RIGHT HAND  Final   Special Requests IN PEDIATRIC BOTTLE 1CC  Final   Culture NO GROWTH 2 DAYS  Final   Report Status PENDING  Incomplete     Invalid input(s): PROCALCITONIN, LACTICACIDVEN   Radiology Studies: No results found.      Scheduled Meds: . ceFEPime (MAXIPIME) IV  2 g Intravenous Q12H  . heparin  5,000 Units Subcutaneous Q8H  . sodium chloride flush  3 mL Intravenous Q12H  . vancomycin  1,000 mg Intravenous Q8H   Continuous Infusions:    LOS: 4 days    Time spent: 35 minutes    Krisann Mckenna A, MD [facility] Pager [phone]  If 7PM-7AM, please contact night-coverage [email or web address] Password [email or web address] 03/21/2016, 11:22 AM

## 2016-03-22 LAB — CULTURE, BLOOD (ROUTINE X 2): Culture: NO GROWTH

## 2016-03-22 NOTE — Progress Notes (Signed)
PROGRESS NOTE  [name]  [id number] DOB: 1971-02-27 DOA: 03/17/2016 PCP: [name] Patient Outpatient Specialists:  Subjective: No complaints today.  Brief Narrative:  [name] is a 45 y.o. female with a history of IV drug use (primarily heroin), hepatitis C recently admitted from 11/11-11/18  For septic arthritis and endocarditis of the R sternoclavicular joint with sternoclavicular abscess incised and drained on 02/22/2016, subsequent VAC placed on November 14. She was discharged from here November 18 to [facility]   D/c grew pseudomonas and comanomas -ID recommended cefepime 2 g every 12 hours for total of 6 weeks, end date was to be 04/05/2016 but she left the facility 5 days ago AMA, and without antibiotics. She kept the PICC line  Last IV drug use with heroine was 2 days ago. She is now presenting with chest pain, and pain in the dorsal aspect of her hand, suspicious of infection.. Denies any fever or chills night sweats, nausea or vomiting. No diarrhea. No lower extremity swelling. She does complain of chest wall pain, and these drainage from her when the incision on the) sternal area. PICC line removed.  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 -Recently discharged to nursing home with PICC line on cefepime. -She did have Comamonas bacteremia and Pseudomonas wound infection. -Left from the SNF AMA, admitted that she done heroin and cocaine in the interim. -Continue cefepime and  vancomycin  MRSA bacteremia -2/2 blood cultures showing MRSA, PICC line removed. 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 -ID on board, appreciate their help. -She might need to stay here for the length of treatment, cannot discharge her to a nursing home or place a PICC line.  Tricuspid valve endocarditis  -TEE doneon 11/13and showed small vegetation involving the septal leaflet of the tricuspid valve. Vegetation measures 0.80.2 cm without evidence of tricuspid valve deterioration.   Septic pulmonary embolism Recent CT showed  2 nodules one of 1.7 cm and the other 0.6 cm suspicious for septic emboli in the RML. TEE done and showed tricuspid valve endocarditis, septic to the lungs.  New CXR shows in addition possible RUL PNA.   Anemia, normocytic  Hemoglobin on admission 9.1, stable at baseline. No overt bleeding  Repeat CBC in am  History of IV drug use with heroin Continue  Bentyl, Imodium and clonidine as needed. Careful use of pain meds   Hepatitis C Appears to be has chronic hepatitis, no transaminitis or coagulopathy. HIV nonreactive. Follow-up with ID clinic as outpatient.  Polysubstance abuse, IVDU as above Will need OP counseling    DVT prophylaxis: SQ Heparin Code Status: Full Code Family Communication:  Disposition Plan:  Diet: Diet regular Room service appropriate? Yes; Fluid consistency: Thin  Consultants:   ID  Procedures:   None  Antimicrobials:   Cefepime and vancomycin.   Objective: Vitals:   03/21/16 1030 03/21/16 1305 03/21/16 2241 03/22/16 0502  BP:  132/72 122/63 125/65  Pulse:  75 (!) 58 (!) 57 (!) 56  Resp:  19 18 18   Temp:  98 F (36.7 C) 98.2 F (36.8 C) 98 F (36.7 C)  TempSrc:  Oral Oral Oral  SpO2:  100% 100% 99%  Weight:      Height:        Intake/Output Summary (Last 24 hours) at 03/22/16 1208 Last data filed at 03/22/16 1036  Gross per 24 hour  Intake               750 ml  Output                0 ml  Net              750 ml   Filed Weights   03/17/16 1015  Weight: 85.3 kg (188 lb)    Examination: General exam: Appears calm and comfortable  Respiratory system: Clear to auscultation. Respiratory effort normal. Cardiovascular system: S1 & S2 heard, RRR. No JVD, murmurs, rubs, gallops or clicks. No pedal edema. Gastrointestinal system: Abdomen is nondistended, soft and nontender. No organomegaly or masses felt. Normal bowel sounds heard. Central nervous system: Alert and oriented. No focal neurological deficits. Extremities: Symmetric 5 x 5 power. Skin: No rashes, lesions or ulcers Psychiatry: Judgement and insight appear normal. Mood & affect appropriate.   Data Reviewed: I have personally reviewed following labs and imaging studies  CBC:  Recent Labs Lab 03/17/16 1107 03/18/16 0419 03/19/16 0522  WBC 14.1* 13.1* 9.2  NEUTROABS 9.7*  --   --   HGB 9.1* 9.7* 9.8*  HCT 27.7* 30.1* 30.3*  MCV 82.7 82.5 82.6  PLT 181 238 [id number]   Basic Metabolic Panel:  Recent Labs Lab 03/17/16 1107 03/18/16 0419 03/19/16 0522  NA 134* 136 138  K 3.4* 3.8 3.7  CL 104 106 105  CO2 21* 19* 23  GLUCOSE 122* 98 102*  BUN 8 7 9   CREATININE 0.67 0.68 0.68  CALCIUM 8.9 8.7* 9.0   GFR: Estimated Creatinine Clearance: 89 mL/min (by C-G formula based on SCr of 0.68 mg/dL). Liver Function Tests:  Recent Labs Lab 03/17/16 1107 03/18/16 0419  AST 13* 13*  ALT 16 13*  ALKPHOS 56 58  BILITOT 0.1* 0.2*  PROT 6.9 6.9  ALBUMIN 3.1* 2.8*   No results for input(s): LIPASE, AMYLASE in the last 168 hours. No results for input(s): AMMONIA in the last 168 hours. Coagulation Profile:  Recent Labs Lab 03/17/16 1107 03/18/16 0419  INR 1.19 1.00   Cardiac Enzymes: No results for input(s): CKTOTAL, CKMB, CKMBINDEX, TROPONINI in the last 168 hours. BNP (last 3 results) No results for input(s): PROBNP in the last 8760 hours. HbA1C: No results for  input(s): HGBA1C in the last 72 hours. CBG: No results for input(s): GLUCAP in the last 168 hours. Lipid Profile: No results for input(s): CHOL, HDL, LDLCALC, TRIG, CHOLHDL, LDLDIRECT in the last 72 hours. Thyroid Function Tests: No results for input(s): TSH, T4TOTAL, FREET4, T3FREE, THYROIDAB in the last 72 hours. Anemia Panel: No results for input(s): VITAMINB12, FOLATE, FERRITIN, TIBC, IRON, RETICCTPCT in the last 72 hours. Urine analysis:    Component Value Date/Time   COLORURINE YELLOW 03/17/2016 1238   APPEARANCEUR HAZY (A) 03/17/2016 1238   LABSPEC 1.012 03/17/2016 1238   PHURINE 6.0 03/17/2016 1238   GLUCOSEU NEGATIVE 03/17/2016 1238   HGBUR NEGATIVE 03/17/2016 [place] 03/17/2016 1238   KETONESUR NEGATIVE 03/17/2016 1238   PROTEINUR NEGATIVE 03/17/2016 1238  UROBILINOGEN 0.2 10/18/2014 0100   NITRITE NEGATIVE 03/17/2016 1238   LEUKOCYTESUR SMALL (A) 03/17/2016 1238   Sepsis Labs: @LABRCNTIP (procalcitonin:4,lacticidven:4)  ) Recent Results (from the past 240 hour(s))  Blood culture (routine x 2)     Status: Abnormal   Collection Time: 03/17/16 11:07 AM  Result Value Ref Range Status   Specimen Description BLOOD LEFT ANTECUBITAL  Final   Special Requests BOTTLES DRAWN AEROBIC AND ANAEROBIC 5CC  Final   Culture  Setup Time   Final    GRAM POSITIVE COCCI IN CLUSTERS ANAEROBIC BOTTLE ONLY CRITICAL RESULT CALLED TO, READ BACK BY AND VERIFIED WITH: E MARTIN 03/18/16 @ 0956 M VESTAL GRAM POSITIVE RODS AEROBIC BOTTLE ONLY CRITICAL RESULT CALLED TO, READ BACK BY AND VERIFIED WITH: E. MARTIN PHARMD, AT 123012/7/17 BY D. VANHOOK    Culture (A)  Final    METHICILLIN RESISTANT STAPHYLOCOCCUS AUREUS DIPHTHEROIDS(CORYNEBACTERIUM SPECIES) Standardized susceptibility testing for this organism is not available.    Report Status 03/20/2016 FINAL  Final   Organism ID, Bacteria METHICILLIN RESISTANT STAPHYLOCOCCUS AUREUS  Final      Susceptibility    Methicillin resistant staphylococcus aureus - MIC*    CIPROFLOXACIN >=8 RESISTANT Resistant     ERYTHROMYCIN >=8 RESISTANT Resistant     GENTAMICIN <=0.5 SENSITIVE Sensitive     OXACILLIN >=4 RESISTANT Resistant     TETRACYCLINE <=1 SENSITIVE Sensitive     VANCOMYCIN <=0.5 SENSITIVE Sensitive     TRIMETH/SULFA <=10 SENSITIVE Sensitive     CLINDAMYCIN >=8 RESISTANT Resistant     RIFAMPIN <=0.5 SENSITIVE Sensitive     Inducible Clindamycin NEGATIVE Sensitive     * METHICILLIN RESISTANT STAPHYLOCOCCUS AUREUS  Blood Culture ID Panel (Reflexed)     Status: Abnormal   Collection Time: 03/17/16 11:07 AM  Result Value Ref Range Status   Enterococcus species NOT DETECTED NOT DETECTED Final   Listeria monocytogenes NOT DETECTED NOT DETECTED Final   Staphylococcus species DETECTED (A) NOT DETECTED Final    Comment: CRITICAL RESULT CALLED TO, READ BACK BY AND VERIFIED WITH: E MARTIN 03/18/16 @ 0956 M VESTAL    Staphylococcus aureus DETECTED (A) NOT DETECTED Final    Comment: CRITICAL RESULT CALLED TO, READ BACK BY AND VERIFIED WITH: E MARTIN 03/18/16 @ 0956 M VESTAL    Methicillin resistance DETECTED (A) NOT DETECTED Final    Comment: CRITICAL RESULT CALLED TO, READ BACK BY AND VERIFIED WITH: E MARTIN 03/18/16 @ 0956 M VESTAL    Streptococcus species NOT DETECTED NOT DETECTED Final   Streptococcus agalactiae NOT DETECTED NOT DETECTED Final   Streptococcus pneumoniae NOT DETECTED NOT DETECTED Final   Streptococcus pyogenes NOT DETECTED NOT DETECTED Final   Acinetobacter baumannii NOT DETECTED NOT DETECTED Final   Enterobacteriaceae species NOT DETECTED NOT DETECTED Final   Enterobacter cloacae complex NOT DETECTED NOT DETECTED Final   Escherichia coli NOT DETECTED NOT DETECTED Final   Klebsiella oxytoca NOT DETECTED NOT DETECTED Final   Klebsiella pneumoniae NOT DETECTED NOT DETECTED Final   Proteus species NOT DETECTED NOT DETECTED Final   Serratia marcescens NOT DETECTED NOT DETECTED Final     Haemophilus influenzae NOT DETECTED NOT DETECTED Final   Neisseria meningitidis NOT DETECTED NOT DETECTED Final   Pseudomonas aeruginosa NOT DETECTED NOT DETECTED Final   Candida albicans NOT DETECTED NOT DETECTED Final   Candida glabrata NOT DETECTED NOT DETECTED Final   Candida krusei NOT DETECTED NOT DETECTED Final   Candida parapsilosis NOT DETECTED NOT DETECTED Final   Candida tropicalis  NOT DETECTED NOT DETECTED Final  Blood Culture ID Panel (Reflexed)     Status: None   Collection Time: 03/17/16 11:07 AM  Result Value Ref Range Status   Enterococcus species NOT DETECTED NOT DETECTED Final   Listeria monocytogenes NOT DETECTED NOT DETECTED Final   Staphylococcus species NOT DETECTED NOT DETECTED Final   Staphylococcus aureus NOT DETECTED NOT DETECTED Final   Streptococcus species NOT DETECTED NOT DETECTED Final   Streptococcus agalactiae NOT DETECTED NOT DETECTED Final   Streptococcus pneumoniae NOT DETECTED NOT DETECTED Final   Streptococcus pyogenes NOT DETECTED NOT DETECTED Final   Acinetobacter baumannii NOT DETECTED NOT DETECTED Final   Enterobacteriaceae species NOT DETECTED NOT DETECTED Final   Enterobacter cloacae complex NOT DETECTED NOT DETECTED Final   Escherichia coli NOT DETECTED NOT DETECTED Final   Klebsiella oxytoca NOT DETECTED NOT DETECTED Final   Klebsiella pneumoniae NOT DETECTED NOT DETECTED Final   Proteus species NOT DETECTED NOT DETECTED Final   Serratia marcescens NOT DETECTED NOT DETECTED Final   Haemophilus influenzae NOT DETECTED NOT DETECTED Final   Neisseria meningitidis NOT DETECTED NOT DETECTED Final   Pseudomonas aeruginosa NOT DETECTED NOT DETECTED Final   Candida albicans NOT DETECTED NOT DETECTED Final   Candida glabrata NOT DETECTED NOT DETECTED Final   Candida krusei NOT DETECTED NOT DETECTED Final   Candida parapsilosis NOT DETECTED NOT DETECTED Final   Candida tropicalis NOT DETECTED NOT DETECTED Final  Urine culture     Status:  Abnormal   Collection Time: 03/17/16 12:38 PM  Result Value Ref Range Status   Specimen Description URINE, RANDOM  Final   Special Requests NONE  Final   Culture MULTIPLE SPECIES PRESENT, SUGGEST RECOLLECTION (A)  Final   Report Status 03/18/2016 FINAL  Final  Blood culture (routine x 2)     Status: None (Preliminary result)   Collection Time: 03/17/16  1:30 PM  Result Value Ref Range Status   Specimen Description BLOOD RIGHT WRIST  Final   Special Requests IN PEDIATRIC BOTTLE 2CC  Final   Culture NO GROWTH 4 DAYS  Final   Report Status PENDING  Incomplete  Culture, blood (Routine X 2) w Reflex to ID Panel     Status: None (Preliminary result)   Collection Time: 03/18/16  2:06 PM  Result Value Ref Range Status   Specimen Description BLOOD RIGHT HAND  Final   Special Requests IN PEDIATRIC BOTTLE 1CC  Final   Culture NO GROWTH 3 DAYS  Final   Report Status PENDING  Incomplete     Invalid input(s): PROCALCITONIN, LACTICACIDVEN   Radiology Studies: No results found.      Scheduled Meds: . ceFEPime (MAXIPIME) IV  2 g Intravenous Q12H  . heparin  5,000 Units Subcutaneous Q8H  . sodium chloride flush  3 mL Intravenous Q12H  . vancomycin  1,000 mg Intravenous Q8H   Continuous Infusions:    LOS: 5 days    Time spent: 35 minutes    Prisha Hiley A, MD [facility] Pager [phone]  If 7PM-7AM, please contact night-coverage [email or web address] Password [email or web address] 03/22/2016, 12:08 PM

## 2016-03-22 NOTE — Progress Notes (Signed)
INFECTIOUS DISEASE PROGRESS NOTE  ID: [name] is a 45 y.o. female with  Active Problems:   Polysubstance abuse   Depression   IV drug abuse   Hepatitis C infection   COPD (chronic obstructive pulmonary disease) (HCC)   Opioid abuse   Pulmonary embolism, septic (HCC)   Septic arthritis of right sternoclavicular joint (HCC)   Anxiety   Cigarette smoker   Endocarditis of tricuspid valve   Wound infection  Subjective: Without complaints.  Some stinging at sternal wound edge.   Abtx:  Anti-infectives    Start     Dose/Rate Route Frequency Ordered Stop   03/17/16 2230  vancomycin (VANCOCIN) IVPB 1000 mg/200 mL premix     1,000 mg 200 mL/hr over 60 Minutes Intravenous Every 8 hours 03/17/16 1416     03/17/16 1430  vancomycin (VANCOCIN) 1,500 mg in sodium chloride 0.9 % 500 mL IVPB     1,500 mg 250 mL/hr over 120 Minutes Intravenous  Once 03/17/16 1353 03/17/16 1858   03/17/16 1330  ceFEPIme (MAXIPIME) 2 g in dextrose 5 % 50 mL IVPB     2 g 100 mL/hr over 30 Minutes Intravenous Every 12 hours 03/17/16 1327        Medications:  Scheduled: . ceFEPime (MAXIPIME) IV  2 g Intravenous Q12H  . heparin  5,000 Units Subcutaneous Q8H  . sodium chloride flush  3 mL Intravenous Q12H  . vancomycin  1,000 mg Intravenous Q8H    Objective: Vital signs in last 24 hours: Temp:  [98 F (36.7 C)-98.2 F (36.8 C)] 98 F (36.7 C) (12/11 0502) Pulse Rate:  [56-58] 56 (12/11 0502) Resp:  [18-19] 18 (12/11 0502) BP: (122-132)/(63-72) 125/65 (12/11 0502) SpO2:  [99 %-100 %] 99 % (12/11 0502)   General appearance: alert, cooperative and no distress Resp: clear to auscultation bilaterally Chest wall: wund partially undresed. clean.  Cardio: regular rate and rhythm GI: normal findings: bowel sounds normal and soft, non-tender  Lab Results No results for input(s): WBC, HGB, HCT, NA, K, CL, CO2, BUN, CREATININE, GLU in the last 72 hours.  Invalid input(s): PLATELETS Liver  Panel No results for input(s): PROT, ALBUMIN, AST, ALT, ALKPHOS, BILITOT, BILIDIR, IBILI in the last 72 hours. Sedimentation Rate No results for input(s): ESRSEDRATE in the last 72 hours. C-Reactive Protein No results for input(s): CRP in the last 72 hours.  Microbiology: Recent Results (from the past 240 hour(s))  Blood culture (routine x 2)     Status: Abnormal   Collection Time: 03/17/16 11:07 AM  Result Value Ref Range Status   Specimen Description BLOOD LEFT ANTECUBITAL  Final   Special Requests BOTTLES DRAWN AEROBIC AND ANAEROBIC 5CC  Final   Culture  Setup Time   Final    GRAM POSITIVE COCCI IN CLUSTERS ANAEROBIC BOTTLE ONLY CRITICAL RESULT CALLED TO, READ BACK BY AND VERIFIED WITH: E MARTIN 03/18/16 @ 0956 M VESTAL GRAM POSITIVE RODS AEROBIC BOTTLE ONLY CRITICAL RESULT CALLED TO, READ BACK BY AND VERIFIED WITH: E. MARTIN PHARMD, AT 123012/7/17 BY D. VANHOOK    Culture (A)  Final    METHICILLIN RESISTANT STAPHYLOCOCCUS AUREUS DIPHTHEROIDS(CORYNEBACTERIUM SPECIES) Standardized susceptibility testing for this organism is not available.    Report Status 03/20/2016 FINAL  Final   Organism ID, Bacteria METHICILLIN RESISTANT STAPHYLOCOCCUS AUREUS  Final      Susceptibility   Methicillin resistant staphylococcus aureus - MIC*    CIPROFLOXACIN >=8 RESISTANT Resistant     ERYTHROMYCIN >=8 RESISTANT Resistant  GENTAMICIN <=0.5 SENSITIVE Sensitive     OXACILLIN >=4 RESISTANT Resistant     TETRACYCLINE <=1 SENSITIVE Sensitive     VANCOMYCIN <=0.5 SENSITIVE Sensitive     TRIMETH/SULFA <=10 SENSITIVE Sensitive     CLINDAMYCIN >=8 RESISTANT Resistant     RIFAMPIN <=0.5 SENSITIVE Sensitive     Inducible Clindamycin NEGATIVE Sensitive     * METHICILLIN RESISTANT STAPHYLOCOCCUS AUREUS  Blood Culture ID Panel (Reflexed)     Status: Abnormal   Collection Time: 03/17/16 11:07 AM  Result Value Ref Range Status   Enterococcus species NOT DETECTED NOT DETECTED Final   Listeria  monocytogenes NOT DETECTED NOT DETECTED Final   Staphylococcus species DETECTED (A) NOT DETECTED Final    Comment: CRITICAL RESULT CALLED TO, READ BACK BY AND VERIFIED WITH: E MARTIN 03/18/16 @ 0956 M VESTAL    Staphylococcus aureus DETECTED (A) NOT DETECTED Final    Comment: CRITICAL RESULT CALLED TO, READ BACK BY AND VERIFIED WITH: E MARTIN 03/18/16 @ 0956 M VESTAL    Methicillin resistance DETECTED (A) NOT DETECTED Final    Comment: CRITICAL RESULT CALLED TO, READ BACK BY AND VERIFIED WITH: E MARTIN 03/18/16 @ 0956 M VESTAL    Streptococcus species NOT DETECTED NOT DETECTED Final   Streptococcus agalactiae NOT DETECTED NOT DETECTED Final   Streptococcus pneumoniae NOT DETECTED NOT DETECTED Final   Streptococcus pyogenes NOT DETECTED NOT DETECTED Final   Acinetobacter baumannii NOT DETECTED NOT DETECTED Final   Enterobacteriaceae species NOT DETECTED NOT DETECTED Final   Enterobacter cloacae complex NOT DETECTED NOT DETECTED Final   Escherichia coli NOT DETECTED NOT DETECTED Final   Klebsiella oxytoca NOT DETECTED NOT DETECTED Final   Klebsiella pneumoniae NOT DETECTED NOT DETECTED Final   Proteus species NOT DETECTED NOT DETECTED Final   Serratia marcescens NOT DETECTED NOT DETECTED Final   Haemophilus influenzae NOT DETECTED NOT DETECTED Final   Neisseria meningitidis NOT DETECTED NOT DETECTED Final   Pseudomonas aeruginosa NOT DETECTED NOT DETECTED Final   Candida albicans NOT DETECTED NOT DETECTED Final   Candida glabrata NOT DETECTED NOT DETECTED Final   Candida krusei NOT DETECTED NOT DETECTED Final   Candida parapsilosis NOT DETECTED NOT DETECTED Final   Candida tropicalis NOT DETECTED NOT DETECTED Final  Blood Culture ID Panel (Reflexed)     Status: None   Collection Time: 03/17/16 11:07 AM  Result Value Ref Range Status   Enterococcus species NOT DETECTED NOT DETECTED Final   Listeria monocytogenes NOT DETECTED NOT DETECTED Final   Staphylococcus species NOT DETECTED NOT  DETECTED Final   Staphylococcus aureus NOT DETECTED NOT DETECTED Final   Streptococcus species NOT DETECTED NOT DETECTED Final   Streptococcus agalactiae NOT DETECTED NOT DETECTED Final   Streptococcus pneumoniae NOT DETECTED NOT DETECTED Final   Streptococcus pyogenes NOT DETECTED NOT DETECTED Final   Acinetobacter baumannii NOT DETECTED NOT DETECTED Final   Enterobacteriaceae species NOT DETECTED NOT DETECTED Final   Enterobacter cloacae complex NOT DETECTED NOT DETECTED Final   Escherichia coli NOT DETECTED NOT DETECTED Final   Klebsiella oxytoca NOT DETECTED NOT DETECTED Final   Klebsiella pneumoniae NOT DETECTED NOT DETECTED Final   Proteus species NOT DETECTED NOT DETECTED Final   Serratia marcescens NOT DETECTED NOT DETECTED Final   Haemophilus influenzae NOT DETECTED NOT DETECTED Final   Neisseria meningitidis NOT DETECTED NOT DETECTED Final   Pseudomonas aeruginosa NOT DETECTED NOT DETECTED Final   Candida albicans NOT DETECTED NOT DETECTED Final   Candida glabrata NOT DETECTED  NOT DETECTED Final   Candida krusei NOT DETECTED NOT DETECTED Final   Candida parapsilosis NOT DETECTED NOT DETECTED Final   Candida tropicalis NOT DETECTED NOT DETECTED Final  Urine culture     Status: Abnormal   Collection Time: 03/17/16 12:38 PM  Result Value Ref Range Status   Specimen Description URINE, RANDOM  Final   Special Requests NONE  Final   Culture MULTIPLE SPECIES PRESENT, SUGGEST RECOLLECTION (A)  Final   Report Status 03/18/2016 FINAL  Final  Blood culture (routine x 2)     Status: None (Preliminary result)   Collection Time: 03/17/16  1:30 PM  Result Value Ref Range Status   Specimen Description BLOOD RIGHT WRIST  Final   Special Requests IN PEDIATRIC BOTTLE 2CC  Final   Culture NO GROWTH 4 DAYS  Final   Report Status PENDING  Incomplete  Culture, blood (Routine X 2) w Reflex to ID Panel     Status: None (Preliminary result)   Collection Time: 03/18/16  2:06 PM  Result Value Ref  Range Status   Specimen Description BLOOD RIGHT HAND  Final   Special Requests IN PEDIATRIC BOTTLE 1CC  Final   Culture NO GROWTH 3 DAYS  Final   Report Status PENDING  Incomplete    Studies/Results: No results found.   Assessment/Plan: MRSA bacteremia Sternal Osteomyelitis TV IE Prev Pseudomonas wound Cx and Comomonas bacteremia Hep C 1a  Continue cefepime and vanco BCx- MRSA, Corynebacter  Day 5 vanco/cefepime      She appears to be doing well.  The problem is what is her disposition?      [name] Infectious Diseases (pager) [phone] [email or web address] 03/22/2016, 12:04 PM  LOS: 5 days

## 2016-03-23 LAB — CULTURE, BLOOD (ROUTINE X 2): Culture: NO GROWTH

## 2016-03-23 LAB — BASIC METABOLIC PANEL
Anion gap: 8 (ref 5–15)
BUN: 11 mg/dL (ref 6–20)
CO2: 25 mmol/L (ref 22–32)
Calcium: 9 mg/dL (ref 8.9–10.3)
Chloride: 105 mmol/L (ref 101–111)
Creatinine, Ser: 0.76 mg/dL (ref 0.44–1.00)
GFR calc Af Amer: >60 mL/min (ref >60)
GFR calc non Af Amer: >60 mL/min (ref >60)
Glucose, Bld: 117 mg/dL — ABNORMAL HIGH (ref 65–99)
Potassium: 4.3 mmol/L (ref 3.5–5.1)
Sodium: 138 mmol/L (ref 135–145)

## 2016-03-23 NOTE — Progress Notes (Signed)
Pt stated, "I'm going downstairs to smoke. The Dr said it was okay". Nurse explained to pt that smoking isn't allowed on premesis. Nurse explained to pt that dr has to order for her to be able to go outside without a staff member and educated her regarding cardiac monitor. Ginger, charge nurse talked with pt as well.

## 2016-03-23 NOTE — Consult Note (Addendum)
[place] Nurse wound follow-up consult note Reason for Consult: Re-assessed right chest wound; appearance and size have improved since previous assessment.  Wound type: Right clavicle area with full thickness wound; 5X.1.3X.2cm, bone palpable Wound bed: 30% yellow, 70% red Drainage (amount, consistency, odor) Mod amt yellow drainage, no odor Periwound: Intact skin surrounding Dressing procedure/placement/frequency: Continue present plan of care with Aquacel to absorb drainage and provide antimicrobial benefits.   If showering is allowed by the primary team: then patient should leave dressing off in the shower, then re-apply the dressing afterwards. Please re-consult if further assistance is needed. Thank-you,  Julien Girt MSN, [place], [place], [place], [place]

## 2016-03-23 NOTE — Progress Notes (Signed)
Pharmacy Antibiotic Note  [name] is a 45 y.o. female admitted on 03/17/2016 with endocarditis.  Pharmacy has been consulted for vancomycin/cefepime dosing.   Continues on broad spectrum abx for MSSA endocarditis and sternal osteo. TEE showed small vegetation on TV. Recently hospitalized for septic arthritis and endocarditis treated with cefepime. Hx of IVDA. Last admit underwent I&D of her sternum for osteo and was d/c with PICC and VAC to SNF. End date was to be 04/05/2016 but she left the facility AMA, and without antibiotics. ID following. PICC removed. Hep C positive and HIV negative in November. Disposition will most likely be a problem with risk of leaving AMA. Will need at least 6 weeks of therapy. Afebrile, WBC wnl.  Plan: Continue cefepime 2g IV Q12 Continue vancomycin 1g IV Q8 Monitor clinical picture, renal function, VT prn F/U C&S, abx deescalation / LOT  Height: 5\' 1"  (154.9 cm) Weight: 188 lb (85.3 kg) IBW/kg (Calculated) : 47.8  Temp (24hrs), Avg:98.3 F (36.8 C), Min:98 F (36.7 C), Max:98.5 F (36.9 C)   Recent Labs Lab 03/17/16 1107 03/17/16 1119 03/17/16 1335 03/18/16 0419 03/19/16 0522 03/21/16 0551 03/23/16 0533  WBC 14.1*  --   --  13.1* 9.2  --   --   CREATININE 0.67  --   --  0.68 0.68  --  0.76  LATICACIDVEN  --  0.45* 1.31  --   --   --   --   VANCOTROUGH  --   --   --   --   --  19  --     Estimated Creatinine Clearance: 89 mL/min (by C-G formula based on SCr of 0.76 mg/dL).    Allergies  Allergen Reactions  . Doxycycline Other (See Comments)    burns  . Mango Flavor Hives and Swelling  . Pyridium [Phenazopyridine Hcl] Other (See Comments)    Irritation to skin  . Tramadol Nausea And Vomiting   Antimicrobials this admission:  Cefepime restart 12/6 >> Vanc 12/6 >>   Abx last admit Zosyn 11/11 >> 11/12; restart 11/13>> 11/15 Vanc 11/11 >> 11/13 CTX 11/12 >> 11/13 Cefepime 11/13 >>11/13; restart 11/15  Dose adjustments this  admission:  12/10 VT 19, slightly early. TT ~18.8 - cont  Microbiology results:  12/7 blood: ngtd 12/6 BCx: MRSA, diptheroids 12/6 BCID: MRSA 12/6 UCx: multiple 11/11 BCx: Comamonas acidovorans (pan-S EXCEPT I-Cefaz, R-Gent) 11/11 BCID: negative 11/12 Abscess cx (intra-op): Pseudomonas, pan sens 11/12 MRSA PCR: neg Thank you for allowing pharmacy to be a part of this patient's care.  Elenor Quinones, PharmD, BCPS Clinical Pharmacist Pager [phone] 03/23/2016 2:46 PM

## 2016-03-23 NOTE — Progress Notes (Signed)
IV team nurse unable to gain IV access after multiple attempts. Paged Dr Hartford Poli.

## 2016-03-23 NOTE — Progress Notes (Signed)
PROGRESS NOTE  [name]  [id number] DOB: [birth year] DOA: 03/17/2016 PCP: [name] Patient Outpatient Specialists:  Subjective: Per nursing staff IV team was not able to place peripheral IV access, will place PICC line. Instructed to nurse to pass this to all staff if patient tried to leave AMA they MUST remove the PICC line. I did not tell the patient it is okay to go downstairs to smoke.  Brief Narrative:  [name] is a 45 y.o. female with a history of IV drug use (primarily heroin), hepatitis C recently admitted from 11/11-11/18  For septic arthritis and endocarditis of the R sternoclavicular joint with sternoclavicular abscess incised and drained on 02/22/2016, subsequent VAC placed on November 14. She was discharged from here November 18 to [facility]   D/c grew pseudomonas and comanomas -ID recommended cefepime 2 g every 12 hours for total of 6 weeks, end date was to be 04/05/2016 but she left the facility 5 days ago AMA, and without antibiotics. She kept the PICC line  Last IV drug use with heroine was 2 days ago. She is now presenting with chest pain, and pain in the dorsal aspect of her hand, suspicious of infection.. Denies any fever or chills night sweats, nausea or vomiting. No diarrhea. 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 -Recently discharged to nursing  home with PICC line on cefepime. -She did have Comamonas bacteremia and Pseudomonas wound infection. -Left from the SNF AMA, admitted that she done heroin and cocaine in the interim. -Continue cefepime and vancomycin, she will need to stay in the hospital for the whole period of treatment  MRSA bacteremia -2/2 blood cultures showing MRSA, PICC line removed. 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 -ID on board, appreciate their help. -She might need to stay here for the length of treatment, cannot discharge her to a nursing home.  Tricuspid valve endocarditis  -TEE doneon 11/13and showed small vegetation involving the septal leaflet of the tricuspid valve. Vegetation measures 0.80.2 cm without evidence of tricuspid valve deterioration.   Septic pulmonary embolism Recent CT showed  2 nodules one of 1.7 cm and the other 0.6 cm suspicious for septic emboli in the RML. TEE done and showed tricuspid valve endocarditis, septic to the lungs.  New CXR shows in addition possible RUL PNA.   Anemia, normocytic  Hemoglobin on admission 9.1, stable at baseline. No overt bleeding  Repeat CBC in am  History of IV drug use with heroin Continue  Bentyl, Imodium and clonidine as needed. Careful use of pain meds   Hepatitis C Appears to be has chronic hepatitis, no transaminitis or coagulopathy. HIV nonreactive. Follow-up with ID clinic as outpatient.  Polysubstance abuse, IVDU as above Will need OP counseling    DVT prophylaxis: SQ Heparin Code Status: Full Code Family Communication:  Disposition Plan:  Diet: Diet regular Room service appropriate? Yes; Fluid consistency: Thin  Consultants:   ID  Procedures:   None  Antimicrobials:   Cefepime and vancomycin.   Objective: Vitals:   03/22/16 0502 03/22/16 1530 03/22/16 2120 03/23/16 0520  BP: 125/65 (!) 156/72 (!) 99/54 128/72  Pulse: (!) 56 67 65 (!) 59  Resp: 18 20  18 16   Temp: 98 F (36.7 C) 98.5 F (36.9 C) 98.5 F (36.9 C) 98.1 F (36.7 C)  TempSrc: Oral Oral Oral Oral  SpO2: 99% 100% 99% 99%  Weight:      Height:        Intake/Output Summary (Last 24 hours) at 03/23/16 1139 Last data filed at 03/22/16 1433  Gross per 24 hour  Intake              440 ml  Output                0 ml  Net              440 ml   Filed Weights   03/17/16 1015  Weight: 85.3 kg (188 lb)    Examination: General exam: Appears calm and comfortable  Respiratory system: Clear to auscultation. Respiratory effort normal. Cardiovascular system: S1 & S2 heard, RRR. No JVD, murmurs, rubs, gallops or clicks. No pedal edema. Gastrointestinal system: Abdomen is nondistended, soft and nontender. No organomegaly or masses felt. Normal bowel sounds heard. Central nervous system: Alert and oriented. No focal neurological deficits. Extremities: Symmetric 5 x 5 power. Skin: No rashes, lesions or ulcers Psychiatry: Judgement and insight appear normal. Mood & affect appropriate.   Data Reviewed: I have personally reviewed following labs and imaging studies  CBC:  Recent Labs Lab 03/17/16 1107 03/18/16 0419 03/19/16 0522  WBC 14.1* 13.1* 9.2  NEUTROABS 9.7*  --   --   HGB 9.1* 9.7* 9.8*  HCT 27.7* 30.1* 30.3*  MCV 82.7 82.5 82.6  PLT 181 238 [id number]   Basic Metabolic Panel:  Recent Labs Lab 03/17/16 1107 03/18/16 0419 03/19/16 0522 03/23/16 0533  NA 134* 136 138 138  K 3.4* 3.8 3.7 4.3  CL 104 106 105 105  CO2 21* 19* 23 25  GLUCOSE 122* 98 102* 117*  BUN 8 7 9 11   CREATININE 0.67 0.68 0.68 0.76  CALCIUM 8.9 8.7* 9.0 9.0   GFR: Estimated Creatinine Clearance: 89 mL/min (by C-G formula based on SCr of 0.76 mg/dL). Liver Function Tests:  Recent Labs Lab 03/17/16 1107 03/18/16 0419  AST 13* 13*  ALT 16 13*  ALKPHOS 56 58  BILITOT 0.1* 0.2*  PROT 6.9 6.9  ALBUMIN 3.1* 2.8*   No results for input(s): LIPASE, AMYLASE in the last 168 hours. No  results for input(s): AMMONIA in the last 168 hours. Coagulation Profile:  Recent Labs Lab 03/17/16 1107 03/18/16 0419  INR 1.19 1.00   Cardiac Enzymes: No results for input(s): CKTOTAL, CKMB, CKMBINDEX, TROPONINI in the last 168 hours. BNP (last 3 results) No results for input(s): PROBNP in the last 8760 hours. HbA1C: No results for input(s): HGBA1C in the last 72 hours. CBG: No results for input(s): GLUCAP in the last 168 hours. Lipid Profile: No results for input(s): CHOL, HDL, LDLCALC, TRIG, CHOLHDL, LDLDIRECT in the last 72 hours. Thyroid Function Tests: No results for input(s): TSH, T4TOTAL, FREET4, T3FREE, THYROIDAB in the last 72 hours. Anemia Panel: No results for input(s): VITAMINB12, FOLATE, FERRITIN, TIBC, IRON, RETICCTPCT in the last 72  hours. Urine analysis:    Component Value Date/Time   COLORURINE YELLOW 03/17/2016 1238   APPEARANCEUR HAZY (A) 03/17/2016 1238   LABSPEC 1.012 03/17/2016 1238   PHURINE 6.0 03/17/2016 1238   GLUCOSEU NEGATIVE 03/17/2016 1238   HGBUR NEGATIVE 03/17/2016 1238   BILIRUBINUR NEGATIVE 03/17/2016 1238   KETONESUR NEGATIVE 03/17/2016 1238   PROTEINUR NEGATIVE 03/17/2016 1238   UROBILINOGEN 0.2 10/18/2014 0100   NITRITE NEGATIVE 03/17/2016 1238   LEUKOCYTESUR SMALL (A) 03/17/2016 1238   Sepsis Labs: @LABRCNTIP (procalcitonin:4,lacticidven:4)  ) Recent Results (from the past 240 hour(s))  Blood culture (routine x 2)     Status: Abnormal   Collection Time: 03/17/16 11:07 AM  Result Value Ref Range Status   Specimen Description BLOOD LEFT ANTECUBITAL  Final   Special Requests BOTTLES DRAWN AEROBIC AND ANAEROBIC 5CC  Final   Culture  Setup Time   Final    GRAM POSITIVE COCCI IN CLUSTERS ANAEROBIC BOTTLE ONLY CRITICAL RESULT CALLED TO, READ BACK BY AND VERIFIED WITH: E MARTIN 03/18/16 @ 0956 M VESTAL GRAM POSITIVE RODS AEROBIC BOTTLE ONLY CRITICAL RESULT CALLED TO, READ BACK BY AND VERIFIED WITH: E. MARTIN PHARMD, AT 123012/7/17  BY D. VANHOOK    Culture (A)  Final    METHICILLIN RESISTANT STAPHYLOCOCCUS AUREUS DIPHTHEROIDS(CORYNEBACTERIUM SPECIES) Standardized susceptibility testing for this organism is not available.    Report Status 03/20/2016 FINAL  Final   Organism ID, Bacteria METHICILLIN RESISTANT STAPHYLOCOCCUS AUREUS  Final      Susceptibility   Methicillin resistant staphylococcus aureus - MIC*    CIPROFLOXACIN >=8 RESISTANT Resistant     ERYTHROMYCIN >=8 RESISTANT Resistant     GENTAMICIN <=0.5 SENSITIVE Sensitive     OXACILLIN >=4 RESISTANT Resistant     TETRACYCLINE <=1 SENSITIVE Sensitive     VANCOMYCIN <=0.5 SENSITIVE Sensitive     TRIMETH/SULFA <=10 SENSITIVE Sensitive     CLINDAMYCIN >=8 RESISTANT Resistant     RIFAMPIN <=0.5 SENSITIVE Sensitive     Inducible Clindamycin NEGATIVE Sensitive     * METHICILLIN RESISTANT STAPHYLOCOCCUS AUREUS  Blood Culture ID Panel (Reflexed)     Status: Abnormal   Collection Time: 03/17/16 11:07 AM  Result Value Ref Range Status   Enterococcus species NOT DETECTED NOT DETECTED Final   Listeria monocytogenes NOT DETECTED NOT DETECTED Final   Staphylococcus species DETECTED (A) NOT DETECTED Final    Comment: CRITICAL RESULT CALLED TO, READ BACK BY AND VERIFIED WITH: E MARTIN 03/18/16 @ 0956 M VESTAL    Staphylococcus aureus DETECTED (A) NOT DETECTED Final    Comment: CRITICAL RESULT CALLED TO, READ BACK BY AND VERIFIED WITH: E MARTIN 03/18/16 @ 0956 M VESTAL    Methicillin resistance DETECTED (A) NOT DETECTED Final    Comment: CRITICAL RESULT CALLED TO, READ BACK BY AND VERIFIED WITH: E MARTIN 03/18/16 @ 0956 M VESTAL    Streptococcus species NOT DETECTED NOT DETECTED Final   Streptococcus agalactiae NOT DETECTED NOT DETECTED Final   Streptococcus pneumoniae NOT DETECTED NOT DETECTED Final   Streptococcus pyogenes NOT DETECTED NOT DETECTED Final   Acinetobacter baumannii NOT DETECTED NOT DETECTED Final   Enterobacteriaceae species NOT DETECTED NOT  DETECTED Final   Enterobacter cloacae complex NOT DETECTED NOT DETECTED Final   Escherichia coli NOT DETECTED NOT DETECTED Final   Klebsiella oxytoca NOT DETECTED NOT DETECTED Final   Klebsiella pneumoniae NOT DETECTED NOT DETECTED Final   Proteus species NOT DETECTED NOT DETECTED Final   Serratia marcescens NOT DETECTED NOT DETECTED Final  Haemophilus influenzae NOT DETECTED NOT DETECTED Final   Neisseria meningitidis NOT DETECTED NOT DETECTED Final   Pseudomonas aeruginosa NOT DETECTED NOT DETECTED Final   Candida albicans NOT DETECTED NOT DETECTED Final   Candida glabrata NOT DETECTED NOT DETECTED Final   Candida krusei NOT DETECTED NOT DETECTED Final   Candida parapsilosis NOT DETECTED NOT DETECTED Final   Candida tropicalis NOT DETECTED NOT DETECTED Final  Blood Culture ID Panel (Reflexed)     Status: None   Collection Time: 03/17/16 11:07 AM  Result Value Ref Range Status   Enterococcus species NOT DETECTED NOT DETECTED Final   Listeria monocytogenes NOT DETECTED NOT DETECTED Final   Staphylococcus species NOT DETECTED NOT DETECTED Final   Staphylococcus aureus NOT DETECTED NOT DETECTED Final   Streptococcus species NOT DETECTED NOT DETECTED Final   Streptococcus agalactiae NOT DETECTED NOT DETECTED Final   Streptococcus pneumoniae NOT DETECTED NOT DETECTED Final   Streptococcus pyogenes NOT DETECTED NOT DETECTED Final   Acinetobacter baumannii NOT DETECTED NOT DETECTED Final   Enterobacteriaceae species NOT DETECTED NOT DETECTED Final   Enterobacter cloacae complex NOT DETECTED NOT DETECTED Final   Escherichia coli NOT DETECTED NOT DETECTED Final   Klebsiella oxytoca NOT DETECTED NOT DETECTED Final   Klebsiella pneumoniae NOT DETECTED NOT DETECTED Final   Proteus species NOT DETECTED NOT DETECTED Final   Serratia marcescens NOT DETECTED NOT DETECTED Final   Haemophilus influenzae NOT DETECTED NOT DETECTED Final   Neisseria meningitidis NOT DETECTED NOT DETECTED Final    Pseudomonas aeruginosa NOT DETECTED NOT DETECTED Final   Candida albicans NOT DETECTED NOT DETECTED Final   Candida glabrata NOT DETECTED NOT DETECTED Final   Candida krusei NOT DETECTED NOT DETECTED Final   Candida parapsilosis NOT DETECTED NOT DETECTED Final   Candida tropicalis NOT DETECTED NOT DETECTED Final  Urine culture     Status: Abnormal   Collection Time: 03/17/16 12:38 PM  Result Value Ref Range Status   Specimen Description URINE, RANDOM  Final   Special Requests NONE  Final   Culture MULTIPLE SPECIES PRESENT, SUGGEST RECOLLECTION (A)  Final   Report Status 03/18/2016 FINAL  Final  Blood culture (routine x 2)     Status: None   Collection Time: 03/17/16  1:30 PM  Result Value Ref Range Status   Specimen Description BLOOD RIGHT WRIST  Final   Special Requests IN PEDIATRIC BOTTLE 2CC  Final   Culture NO GROWTH 5 DAYS  Final   Report Status 03/22/2016 FINAL  Final  Culture, blood (Routine X 2) w Reflex to ID Panel     Status: None (Preliminary result)   Collection Time: 03/18/16  2:06 PM  Result Value Ref Range Status   Specimen Description BLOOD RIGHT HAND  Final   Special Requests IN PEDIATRIC BOTTLE 1CC  Final   Culture NO GROWTH 4 DAYS  Final   Report Status PENDING  Incomplete     Invalid input(s): PROCALCITONIN, LACTICACIDVEN   Radiology Studies: No results found.      Scheduled Meds: . ceFEPime (MAXIPIME) IV  2 g Intravenous Q12H  . heparin  5,000 Units Subcutaneous Q8H  . sodium chloride flush  3 mL Intravenous Q12H  . vancomycin  1,000 mg Intravenous Q8H   Continuous Infusions:    LOS: 6 days    Time spent: 35 minutes    Sweetie Giebler A, MD [facility] Pager [phone]  If 7PM-7AM, please contact night-coverage [email or web address] Password [email or web address] 03/23/2016, 11:39 AM

## 2016-03-23 NOTE — Progress Notes (Signed)
INFECTIOUS DISEASE PROGRESS NOTE  ID: [name] is a 45 y.o. female with  Active Problems:   Polysubstance abuse   Depression   IV drug abuse   Hepatitis C infection   COPD (chronic obstructive pulmonary disease) (HCC)   Opioid abuse   Pulmonary embolism, septic (HCC)   Septic arthritis of right sternoclavicular joint (HCC)   Anxiety   Cigarette smoker   Endocarditis of tricuspid valve   Wound infection  Subjective: No complaints.   Abtx:  Anti-infectives    Start     Dose/Rate Route Frequency Ordered Stop   03/17/16 2230  vancomycin (VANCOCIN) IVPB 1000 mg/200 mL premix     1,000 mg 200 mL/hr over 60 Minutes Intravenous Every 8 hours 03/17/16 1416     03/17/16 1430  vancomycin (VANCOCIN) 1,500 mg in sodium chloride 0.9 % 500 mL IVPB     1,500 mg 250 mL/hr over 120 Minutes Intravenous  Once 03/17/16 1353 03/17/16 1858   03/17/16 1330  ceFEPIme (MAXIPIME) 2 g in dextrose 5 % 50 mL IVPB     2 g 100 mL/hr over 30 Minutes Intravenous Every 12 hours 03/17/16 1327        Medications:  Scheduled: . ceFEPime (MAXIPIME) IV  2 g Intravenous Q12H  . heparin  5,000 Units Subcutaneous Q8H  . sodium chloride flush  3 mL Intravenous Q12H  . vancomycin  1,000 mg Intravenous Q8H    Objective: Vital signs in last 24 hours: Temp:  [98 F (36.7 C)-98.5 F (36.9 C)] 98 F (36.7 C) (12/12 1424) Pulse Rate:  [59-70] 70 (12/12 1424) Resp:  [16-18] 18 (12/12 1424) BP: (99-128)/(54-83) 106/83 (12/12 1424) SpO2:  [99 %] 99 % (12/12 1424)   General appearance: alert, cooperative and no distress Resp: clear to auscultation bilaterally Chest wall: no tenderness, wound unchanged. no d/c.  Cardio: regular rate and rhythm GI: normal findings: bowel sounds normal and soft, non-tender  Lab Results  Recent Labs  03/23/16 0533  NA 138  K 4.3  CL 105  CO2 25  BUN 11  CREATININE 0.76   Liver Panel No results for input(s): PROT, ALBUMIN, AST, ALT, ALKPHOS, BILITOT, BILIDIR,  IBILI in the last 72 hours. Sedimentation Rate No results for input(s): ESRSEDRATE in the last 72 hours. C-Reactive Protein No results for input(s): CRP in the last 72 hours.  Microbiology: Recent Results (from the past 240 hour(s))  Blood culture (routine x 2)     Status: Abnormal   Collection Time: 03/17/16 11:07 AM  Result Value Ref Range Status   Specimen Description BLOOD LEFT ANTECUBITAL  Final   Special Requests BOTTLES DRAWN AEROBIC AND ANAEROBIC 5CC  Final   Culture  Setup Time   Final    GRAM POSITIVE COCCI IN CLUSTERS ANAEROBIC BOTTLE ONLY CRITICAL RESULT CALLED TO, READ BACK BY AND VERIFIED WITH: E MARTIN 03/18/16 @ 0956 M VESTAL GRAM POSITIVE RODS AEROBIC BOTTLE ONLY CRITICAL RESULT CALLED TO, READ BACK BY AND VERIFIED WITH: E. MARTIN PHARMD, AT 123012/7/17 BY D. VANHOOK    Culture (A)  Final    METHICILLIN RESISTANT STAPHYLOCOCCUS AUREUS DIPHTHEROIDS(CORYNEBACTERIUM SPECIES) Standardized susceptibility testing for this organism is not available.    Report Status 03/20/2016 FINAL  Final   Organism ID, Bacteria METHICILLIN RESISTANT STAPHYLOCOCCUS AUREUS  Final      Susceptibility   Methicillin resistant staphylococcus aureus - MIC*    CIPROFLOXACIN >=8 RESISTANT Resistant     ERYTHROMYCIN >=8 RESISTANT Resistant  GENTAMICIN <=0.5 SENSITIVE Sensitive     OXACILLIN >=4 RESISTANT Resistant     TETRACYCLINE <=1 SENSITIVE Sensitive     VANCOMYCIN <=0.5 SENSITIVE Sensitive     TRIMETH/SULFA <=10 SENSITIVE Sensitive     CLINDAMYCIN >=8 RESISTANT Resistant     RIFAMPIN <=0.5 SENSITIVE Sensitive     Inducible Clindamycin NEGATIVE Sensitive     * METHICILLIN RESISTANT STAPHYLOCOCCUS AUREUS  Blood Culture ID Panel (Reflexed)     Status: Abnormal   Collection Time: 03/17/16 11:07 AM  Result Value Ref Range Status   Enterococcus species NOT DETECTED NOT DETECTED Final   Listeria monocytogenes NOT DETECTED NOT DETECTED Final   Staphylococcus species DETECTED (A) NOT  DETECTED Final    Comment: CRITICAL RESULT CALLED TO, READ BACK BY AND VERIFIED WITH: E MARTIN 03/18/16 @ 0956 M VESTAL    Staphylococcus aureus DETECTED (A) NOT DETECTED Final    Comment: CRITICAL RESULT CALLED TO, READ BACK BY AND VERIFIED WITH: E MARTIN 03/18/16 @ 0956 M VESTAL    Methicillin resistance DETECTED (A) NOT DETECTED Final    Comment: CRITICAL RESULT CALLED TO, READ BACK BY AND VERIFIED WITH: E MARTIN 03/18/16 @ 0956 M VESTAL    Streptococcus species NOT DETECTED NOT DETECTED Final   Streptococcus agalactiae NOT DETECTED NOT DETECTED Final   Streptococcus pneumoniae NOT DETECTED NOT DETECTED Final   Streptococcus pyogenes NOT DETECTED NOT DETECTED Final   Acinetobacter baumannii NOT DETECTED NOT DETECTED Final   Enterobacteriaceae species NOT DETECTED NOT DETECTED Final   Enterobacter cloacae complex NOT DETECTED NOT DETECTED Final   Escherichia coli NOT DETECTED NOT DETECTED Final   Klebsiella oxytoca NOT DETECTED NOT DETECTED Final   Klebsiella pneumoniae NOT DETECTED NOT DETECTED Final   Proteus species NOT DETECTED NOT DETECTED Final   Serratia marcescens NOT DETECTED NOT DETECTED Final   Haemophilus influenzae NOT DETECTED NOT DETECTED Final   Neisseria meningitidis NOT DETECTED NOT DETECTED Final   Pseudomonas aeruginosa NOT DETECTED NOT DETECTED Final   Candida albicans NOT DETECTED NOT DETECTED Final   Candida glabrata NOT DETECTED NOT DETECTED Final   Candida krusei NOT DETECTED NOT DETECTED Final   Candida parapsilosis NOT DETECTED NOT DETECTED Final   Candida tropicalis NOT DETECTED NOT DETECTED Final  Blood Culture ID Panel (Reflexed)     Status: None   Collection Time: 03/17/16 11:07 AM  Result Value Ref Range Status   Enterococcus species NOT DETECTED NOT DETECTED Final   Listeria monocytogenes NOT DETECTED NOT DETECTED Final   Staphylococcus species NOT DETECTED NOT DETECTED Final   Staphylococcus aureus NOT DETECTED NOT DETECTED Final   Streptococcus  species NOT DETECTED NOT DETECTED Final   Streptococcus agalactiae NOT DETECTED NOT DETECTED Final   Streptococcus pneumoniae NOT DETECTED NOT DETECTED Final   Streptococcus pyogenes NOT DETECTED NOT DETECTED Final   Acinetobacter baumannii NOT DETECTED NOT DETECTED Final   Enterobacteriaceae species NOT DETECTED NOT DETECTED Final   Enterobacter cloacae complex NOT DETECTED NOT DETECTED Final   Escherichia coli NOT DETECTED NOT DETECTED Final   Klebsiella oxytoca NOT DETECTED NOT DETECTED Final   Klebsiella pneumoniae NOT DETECTED NOT DETECTED Final   Proteus species NOT DETECTED NOT DETECTED Final   Serratia marcescens NOT DETECTED NOT DETECTED Final   Haemophilus influenzae NOT DETECTED NOT DETECTED Final   Neisseria meningitidis NOT DETECTED NOT DETECTED Final   Pseudomonas aeruginosa NOT DETECTED NOT DETECTED Final   Candida albicans NOT DETECTED NOT DETECTED Final   Candida glabrata NOT DETECTED  NOT DETECTED Final   Candida krusei NOT DETECTED NOT DETECTED Final   Candida parapsilosis NOT DETECTED NOT DETECTED Final   Candida tropicalis NOT DETECTED NOT DETECTED Final  Urine culture     Status: Abnormal   Collection Time: 03/17/16 12:38 PM  Result Value Ref Range Status   Specimen Description URINE, RANDOM  Final   Special Requests NONE  Final   Culture MULTIPLE SPECIES PRESENT, SUGGEST RECOLLECTION (A)  Final   Report Status 03/18/2016 FINAL  Final  Blood culture (routine x 2)     Status: None   Collection Time: 03/17/16  1:30 PM  Result Value Ref Range Status   Specimen Description BLOOD RIGHT WRIST  Final   Special Requests IN PEDIATRIC BOTTLE 2CC  Final   Culture NO GROWTH 5 DAYS  Final   Report Status 03/22/2016 FINAL  Final  Culture, blood (Routine X 2) w Reflex to ID Panel     Status: None   Collection Time: 03/18/16  2:06 PM  Result Value Ref Range Status   Specimen Description BLOOD RIGHT HAND  Final   Special Requests IN PEDIATRIC BOTTLE [place]  Final   Culture NO  GROWTH 5 DAYS  Final   Report Status 03/23/2016 FINAL  Final    Studies/Results: No results found.   Assessment/Plan: MRSA bacteremia Sternal Osteomyelitis TV IE Prev Pseudomonas wound Cx and Comomonas bacteremia Hep C 1a BCx- MRSA, Corynebacter Repeat BCx 12-7 negative  Continue cefepime and vanco til d/c Then would change her to doxy/levaquin for at least 2 months.  She states she will take these Please have her f/u with me in 1 month Please have her f/u with [facility].   available as needed.  Day 6 vanco/cefepime         [name] Infectious Diseases (pager) [phone] [email or web address] 03/23/2016, 4:02 PM  LOS: 6 days

## 2016-03-24 DIAGNOSIS — R7881 Bacteremia: Secondary | ICD-10-CM

## 2016-03-24 NOTE — Progress Notes (Signed)
[facility] PROGRESS NOTE  [name] [id number] DOB: Mar 14, 1971 DOA: 03/17/2016  PCP: [name] Patient  Brief History/Interval Summary: 45 y.o.femalewith a history of IV drug use (primarily heroin), hepatitis C recently admitted from 11/11-11/18 For septic arthritisand endocarditisof the R sternoclavicular joint with sternoclavicular abscess incised and drainedon 02/22/2016, subsequent VACplaced on November 14. [facility]. Cultures grew pseudomonas and comanomas -ID recommended cefepime 2 g every 12 hours for total of 6 weeks, end date was to be12/25/2017 but she left the facility 5 days ago AMA, and without antibiotics. She kept the PICC line Last IV drug use with heroine was 2 days ago. She is now presenting with chest pain, and pain in the dorsal aspect of her hand, suspicious of infection.. Denies any fever or chills night sweats, nausea or vomiting. No diarrhea. PICC line removed, culture showed MRSA, likely she will need to stay in the hospital for the whole length of antibiotics  Reason for Visit: MRSA bacteremia  Consultants: Infectious disease  Procedures: None  Antibiotics: Vancomycin and cefepime  Subjective/Interval History: Patient denies any complaints. Specifically denies shortness of breath or chest pain.  ROS: Denies any nausea or vomiting  Objective:  Vital Signs  Vitals:   03/23/16 0520 03/23/16 1424 03/23/16 2153 03/24/16 0511  BP: 128/72 106/83 (!) 90/53 119/74  Pulse: (!) 59 70 64 63  Resp: 16 18 18 18   Temp: 98.1 F (36.7 C) 98 F (36.7 C) 98.4 F (36.9 C) 98.2 F (36.8 C)  TempSrc: Oral Oral Oral Oral  SpO2: 99% 99% 98% 100%  Weight:      Height:        Intake/Output Summary (Last 24 hours) at 03/24/16 1526 Last data filed at 03/24/16 1349  Gross per 24 hour  Intake              810 ml  Output                0 ml  Net              810 ml   Filed Weights   03/17/16 1015  Weight: 85.3 kg (188 lb)    General appearance: alert, cooperative, appears stated age and no distress Resp: clear to auscultation bilaterally Cardio: S1, S2 normal, regular. Systolic murmur appreciated over the precordium. GI: soft, non-tender; bowel sounds normal; no masses,  no organomegaly Extremities: extremities normal, atraumatic, no cyanosis or edema Neurologic: No focal deficits  Lab Results:  Data Reviewed: I have personally reviewed following labs and imaging studies  CBC:  Recent Labs Lab 03/18/16 0419 03/19/16 0522  WBC 13.1* 9.2  HGB 9.7* 9.8*  HCT 30.1* 30.3*  MCV 82.5 82.6  PLT 238 [id number]    Basic Metabolic Panel:  Recent Labs Lab 03/18/16 0419 03/19/16 0522 03/23/16 0533  NA 136 138 138  K 3.8 3.7 4.3  CL 106 105 105  CO2 19* 23 25  GLUCOSE 98 102* 117*  BUN 7 9 11   CREATININE 0.68 0.68 0.76  CALCIUM 8.7* 9.0 9.0    GFR: Estimated Creatinine Clearance: 89 mL/min (by C-G formula based on SCr of 0.76 mg/dL).  Liver Function Tests:  Recent Labs Lab 03/18/16 0419  AST 13*  ALT 13*  ALKPHOS 58  BILITOT 0.2*  PROT 6.9  ALBUMIN 2.8*    Coagulation Profile:  Recent Labs Lab 03/18/16 0419  INR 1.00     Recent Results (from  the past 240 hour(s))  Blood culture (routine x 2)     Status: Abnormal   Collection Time: 03/17/16 11:07 AM  Result Value Ref Range Status   Specimen Description BLOOD LEFT ANTECUBITAL  Final   Special Requests BOTTLES DRAWN AEROBIC AND ANAEROBIC 5CC  Final   Culture  Setup Time   Final    GRAM POSITIVE COCCI IN CLUSTERS ANAEROBIC BOTTLE ONLY CRITICAL RESULT CALLED TO, READ BACK BY AND VERIFIED WITH: E MARTIN 03/18/16 @ 0956 M VESTAL GRAM POSITIVE RODS AEROBIC BOTTLE ONLY CRITICAL RESULT CALLED TO, READ BACK BY AND VERIFIED WITH: E. MARTIN PHARMD, AT 123012/7/17 BY D. VANHOOK    Culture (A)  Final    METHICILLIN RESISTANT STAPHYLOCOCCUS AUREUS DIPHTHEROIDS(CORYNEBACTERIUM  SPECIES) Standardized susceptibility testing for this organism is not available.    Report Status 03/20/2016 FINAL  Final   Organism ID, Bacteria METHICILLIN RESISTANT STAPHYLOCOCCUS AUREUS  Final      Susceptibility   Methicillin resistant staphylococcus aureus - MIC*    CIPROFLOXACIN >=8 RESISTANT Resistant     ERYTHROMYCIN >=8 RESISTANT Resistant     GENTAMICIN <=0.5 SENSITIVE Sensitive     OXACILLIN >=4 RESISTANT Resistant     TETRACYCLINE <=1 SENSITIVE Sensitive     VANCOMYCIN <=0.5 SENSITIVE Sensitive     TRIMETH/SULFA <=10 SENSITIVE Sensitive     CLINDAMYCIN >=8 RESISTANT Resistant     RIFAMPIN <=0.5 SENSITIVE Sensitive     Inducible Clindamycin NEGATIVE Sensitive     * METHICILLIN RESISTANT STAPHYLOCOCCUS AUREUS  Blood Culture ID Panel (Reflexed)     Status: Abnormal   Collection Time: 03/17/16 11:07 AM  Result Value Ref Range Status   Enterococcus species NOT DETECTED NOT DETECTED Final   Listeria monocytogenes NOT DETECTED NOT DETECTED Final   Staphylococcus species DETECTED (A) NOT DETECTED Final    Comment: CRITICAL RESULT CALLED TO, READ BACK BY AND VERIFIED WITH: E MARTIN 03/18/16 @ 0956 M VESTAL    Staphylococcus aureus DETECTED (A) NOT DETECTED Final    Comment: CRITICAL RESULT CALLED TO, READ BACK BY AND VERIFIED WITH: E MARTIN 03/18/16 @ 0956 M VESTAL    Methicillin resistance DETECTED (A) NOT DETECTED Final    Comment: CRITICAL RESULT CALLED TO, READ BACK BY AND VERIFIED WITH: E MARTIN 03/18/16 @ 0956 M VESTAL    Streptococcus species NOT DETECTED NOT DETECTED Final   Streptococcus agalactiae NOT DETECTED NOT DETECTED Final   Streptococcus pneumoniae NOT DETECTED NOT DETECTED Final   Streptococcus pyogenes NOT DETECTED NOT DETECTED Final   Acinetobacter baumannii NOT DETECTED NOT DETECTED Final   Enterobacteriaceae species NOT DETECTED NOT DETECTED Final   Enterobacter cloacae complex NOT DETECTED NOT DETECTED Final   Escherichia coli NOT DETECTED NOT  DETECTED Final   Klebsiella oxytoca NOT DETECTED NOT DETECTED Final   Klebsiella pneumoniae NOT DETECTED NOT DETECTED Final   Proteus species NOT DETECTED NOT DETECTED Final   Serratia marcescens NOT DETECTED NOT DETECTED Final   Haemophilus influenzae NOT DETECTED NOT DETECTED Final   Neisseria meningitidis NOT DETECTED NOT DETECTED Final   Pseudomonas aeruginosa NOT DETECTED NOT DETECTED Final   Candida albicans NOT DETECTED NOT DETECTED Final   Candida glabrata NOT DETECTED NOT DETECTED Final   Candida krusei NOT DETECTED NOT DETECTED Final   Candida parapsilosis NOT DETECTED NOT DETECTED Final   Candida tropicalis NOT DETECTED NOT DETECTED Final  Blood Culture ID Panel (Reflexed)     Status: None   Collection Time: 03/17/16 11:07 AM  Result Value Ref  Range Status   Enterococcus species NOT DETECTED NOT DETECTED Final   Listeria monocytogenes NOT DETECTED NOT DETECTED Final   Staphylococcus species NOT DETECTED NOT DETECTED Final   Staphylococcus aureus NOT DETECTED NOT DETECTED Final   Streptococcus species NOT DETECTED NOT DETECTED Final   Streptococcus agalactiae NOT DETECTED NOT DETECTED Final   Streptococcus pneumoniae NOT DETECTED NOT DETECTED Final   Streptococcus pyogenes NOT DETECTED NOT DETECTED Final   Acinetobacter baumannii NOT DETECTED NOT DETECTED Final   Enterobacteriaceae species NOT DETECTED NOT DETECTED Final   Enterobacter cloacae complex NOT DETECTED NOT DETECTED Final   Escherichia coli NOT DETECTED NOT DETECTED Final   Klebsiella oxytoca NOT DETECTED NOT DETECTED Final   Klebsiella pneumoniae NOT DETECTED NOT DETECTED Final   Proteus species NOT DETECTED NOT DETECTED Final   Serratia marcescens NOT DETECTED NOT DETECTED Final   Haemophilus influenzae NOT DETECTED NOT DETECTED Final   Neisseria meningitidis NOT DETECTED NOT DETECTED Final   Pseudomonas aeruginosa NOT DETECTED NOT DETECTED Final   Candida albicans NOT DETECTED NOT DETECTED Final   Candida  glabrata NOT DETECTED NOT DETECTED Final   Candida krusei NOT DETECTED NOT DETECTED Final   Candida parapsilosis NOT DETECTED NOT DETECTED Final   Candida tropicalis NOT DETECTED NOT DETECTED Final  Urine culture     Status: Abnormal   Collection Time: 03/17/16 12:38 PM  Result Value Ref Range Status   Specimen Description URINE, RANDOM  Final   Special Requests NONE  Final   Culture MULTIPLE SPECIES PRESENT, SUGGEST RECOLLECTION (A)  Final   Report Status 03/18/2016 FINAL  Final  Blood culture (routine x 2)     Status: None   Collection Time: 03/17/16  1:30 PM  Result Value Ref Range Status   Specimen Description BLOOD RIGHT WRIST  Final   Special Requests IN PEDIATRIC BOTTLE 2CC  Final   Culture NO GROWTH 5 DAYS  Final   Report Status 03/22/2016 FINAL  Final  Culture, blood (Routine X 2) w Reflex to ID Panel     Status: None   Collection Time: 03/18/16  2:06 PM  Result Value Ref Range Status   Specimen Description BLOOD RIGHT HAND  Final   Special Requests IN PEDIATRIC BOTTLE 1CC  Final   Culture NO GROWTH 5 DAYS  Final   Report Status 03/23/2016 FINAL  Final      Radiology Studies: No results found.   Medications:  Scheduled: . ceFEPime (MAXIPIME) IV  2 g Intravenous Q12H  . heparin  5,000 Units Subcutaneous Q8H  . sodium chloride flush  3 mL Intravenous Q12H  . 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 -Recently discharged to nursing home with PICC line on cefepime. -She did have Comamonas bacteremia and Pseudomonas wound infection. -Left from the SNF AMA, admitted that she done heroin and cocaine in the interim. -Continue cefepime  and vancomycin. Due to need for IV antibiotics and due to recent IV drug use she will need to stay in the hospital for the whole period of treatment. We'll discuss with infectious disease as well.  MRSA bacteremia -2/2 blood cultures showing MRSA, PICC line removed. 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 -ID on board, appreciate their help.  Tricuspid valve endocarditis  -TEE doneon 11/13and showed small vegetation involving the septal leaflet of the tricuspid valve. Vegetation measures 0.80.2 cm without evidence of tricuspid valve deterioration.   Septic pulmonary embolism Recent CT showed 2 nodules one of1.7 cm and the other 0.6 cm suspicious for septic emboli in the RML. TEE done and showed tricuspid valve endocarditis, septic to the lungs.  New CXR shows in addition possible RUL PNA.   Anemia, normocytic  Hemoglobin on admission 9.1, stable at baseline. No overt bleeding   History of IV drug use with heroin Continue Bentyl, Imodium and clonidine as needed. Careful use of pain meds   Hepatitis C  Appears to be has chronic hepatitis, no transaminitis or coagulopathy. HIV nonreactive. Follow-up with ID clinic as outpatient.  Polysubstance abuse,  IVDU as above Will need OP counseling  DVT Prophylaxis: Subcutaneous heparin    Code Status: Full code  Family Communication: Discussed with the patient  Disposition Plan: May have to stay in the hospital for the entire duration of treatment with IV antibiotics. Will also discuss with ID.    LOS: 7 days    [facility] Pager [phone] 03/24/2016, 3:26 PM  If 7PM-7AM, please contact night-coverage at [email or web address], password [facility]

## 2016-03-25 ENCOUNTER — Ambulatory Visit: Payer: Medicare Other | Admitting: Thoracic Surgery (Cardiothoracic Vascular Surgery)

## 2016-03-25 MED ORDER — DECUBI-VITE PO CAPS: ORAL_CAPSULE | ORAL | 0 refills | Status: DC

## 2016-03-25 MED ORDER — OXYCODONE-ACETAMINOPHEN 5-325 MG PO TABS: 1.0000 | ORAL_TABLET | ORAL | 0 refills | Status: DC | PRN

## 2016-03-25 MED ORDER — ONDANSETRON HCL 4 MG PO TABS: 4.0000 mg | ORAL_TABLET | Freq: Three times a day (TID) | ORAL | 0 refills | Status: DC | PRN

## 2016-03-25 MED ORDER — SULFAMETHOXAZOLE-TRIMETHOPRIM 800-160 MG PO TABS
1.0000 | ORAL_TABLET | Freq: Two times a day (BID) | ORAL | Status: DC
  Administered 2016-03-25: 1 via ORAL
  Filled 2016-03-25: qty 1

## 2016-03-25 MED ORDER — POLYETHYLENE GLYCOL 3350 17 G PO PACK: 17.0000 g | PACK | Freq: Every day | ORAL | 0 refills | Status: DC | PRN

## 2016-03-25 MED ORDER — LEVOFLOXACIN 500 MG PO TABS
500.0000 mg | ORAL_TABLET | Freq: Every day | ORAL | Status: DC
  Administered 2016-03-25: 500 mg via ORAL
  Filled 2016-03-25: qty 1

## 2016-03-25 MED ORDER — SULFAMETHOXAZOLE-TRIMETHOPRIM 800-160 MG PO TABS: 1.0000 | ORAL_TABLET | Freq: Two times a day (BID) | ORAL | 0 refills | Status: DC

## 2016-03-25 MED ORDER — ZINC SULFATE 220 (50 ZN) MG PO CAPS: 220.0000 mg | ORAL_CAPSULE | Freq: Every day | ORAL | 0 refills | Status: DC

## 2016-03-25 MED ORDER — VITAMIN C 500 MG PO TABS: 500.0000 mg | ORAL_TABLET | Freq: Every day | ORAL | 0 refills | Status: DC

## 2016-03-25 MED ORDER — LEVOFLOXACIN 500 MG PO TABS: 500.0000 mg | ORAL_TABLET | Freq: Every day | ORAL | 0 refills | Status: DC

## 2016-03-25 NOTE — Discharge Instructions (Signed)
Bacteremia Introduction Bacteremia is the presence of bacteria in the blood. A small amount of bacteria may not cause any symptoms. Sometimes, the bacteria spread and cause infection in other parts of the body, such as the heart, joints, bones, or brain. Having a great amount of bacteria can cause a serious, sometimes life-threatening infection called sepsis. What are the causes? This condition is caused by bacteria that get into the blood. Bacteria can enter the blood:  During a dental or medical procedure.  After you brush your teeth so hard that the gums bleed.  Through a scrape or cut on your skin. More severe types of bacteremia can be caused by:  A bacterial infection, such as pneumonia, that spreads to the blood.  Using a dirty needle. What increases the risk? This condition is more likely to develop in:  Children and elderly adults.  People who have a long-lasting (chronic) disease or medical condition.  People who have an artificial joint or heart valve.  People who have heart valve disease.  People who have a tube, such as a catheter or IV tube, that has been inserted for a medical treatment.  People who have a weak body defense system (immune system).  People who use IV drugs. What are the signs or symptoms? Usually, this condition does not cause symptoms when it is mild. When it is more serious, it may cause:  Fever.  Chills.  Racing heart.  Shortness of breath.  Dizziness.  Weakness.  Confusion.  Nausea or vomiting.  Diarrhea. Bacteremia that has spread to other parts of the body may cause symptoms in those areas. How is this diagnosed? This condition may be diagnosed with a physical exam and tests, such as:  A complete blood count (CBC). This test looks for signs of infection.  Blood cultures. These look for bacteria in your blood.  Tests of any IV tubes. These look for a source of infection.  Urine tests.  Imaging tests, such as an  X-ray, CT scan, MRI, or heart ultrasound. How is this treated? If the condition is mild, treatment is usually not needed. Usually, the bodys immune system will remove the bacteria. If the condition is more serious, it may be treated with:  Antibiotic medicines through an IV tube. These may be given for about 2 weeks. At first, the antibiotic that is given may kill most types of blood bacteria. If your test results show that a certain kind of bacteria is causing problems, the antibiotic may be changed to kill only the bacteria that are causing problems.  Antibiotics taken by mouth.  Removing any catheter or IV tube that is a source of infection.  Blood pressure and breathing support, if needed.  Surgery to control the source or spread of infection, if needed. Follow these instructions at home:  Take over-the-counter and prescription medicines only as told by your health care provider.  If you were prescribed an antibiotic, take it as told by your health care provider. Do not stop taking the antibiotic even if you start to feel better.  Rest at home until your condition is under control.  Drink enough fluid to keep your urine clear or pale yellow.  Keep all follow-up visits as told by your health care provider. This is important. How is this prevented? Take these actions to help prevent future episodes of bacteremia:  Get all vaccinations as recommended by your health care provider.  Clean and cover scrapes or cuts.  Bathe regularly.  Wash your  hands often.  Before any dental or surgical procedure, ask your health care provider if you should take an antibiotic. Contact a health care provider if:  Your symptoms get worse.  You continue to have symptoms after treatment.  You develop new symptoms after treatment. Get help right away if:  You have chest pain or trouble breathing.  You develop confusion, dizziness, or weakness.  You develop pale skin. This information is  not intended to replace advice given to you by your health care provider. Make sure you discuss any questions you have with your health care provider. Document Released: 01/10/2006 Document Revised: 10/17/2015 Document Reviewed: 06/01/2014  2017 [employer]

## 2016-03-25 NOTE — Progress Notes (Signed)
[name] to be D/C'd Home per MD order.  Discussed with the patient and all questions fully answered.  VSS. Dressing clean, dry and intact. IV catheter discontinued intact. Site without signs and symptoms of complications. Dressing and pressure applied.  An After Visit Summary was printed and given to the patient. Patient received prescription.  D/c education completed with patient/family including follow up instructions, medication list, d/c activities limitations if indicated, with other d/c instructions as indicated by MD - patient able to verbalize understanding, all questions fully answered.   Patient instructed to return to ED, call 911, or call MD for any changes in condition.     L'ESPERANCE, Lain Tetterton C 03/25/2016 12:30 PM

## 2016-03-25 NOTE — Discharge Summary (Signed)
[facility]  Physician Discharge Summary   Patient ID: [name] MRN: [id number] DOB/AGE: [birth year] 45 y.o.  Admit date: 03/17/2016 Discharge date: 03/25/2016  PCP: [name] Patient  DISCHARGE DIAGNOSES:  Active Problems:   Polysubstance abuse   Depression   IV drug abuse   Hepatitis C infection   COPD (chronic obstructive pulmonary disease) (HCC)   Opioid abuse   Pulmonary embolism, septic (HCC)   Septic arthritis of right sternoclavicular joint (HCC)   Anxiety   Cigarette smoker   Endocarditis of tricuspid valve   Wound infection   RECOMMENDATIONS FOR OUTPATIENT FOLLOW UP: 1. 1 month follow-up with Dr. Johnnye Sima with infectious disease 2. Home health has been ordered for wound care   DISCHARGE CONDITION: fair  Diet recommendation: As before  Filed Weights   03/17/16 1015  Weight: 85.3 kg (188 lb)    INITIAL HISTORY: 45 y.o.femalewith a history of IV drug use (primarily heroin), hepatitis C recently admitted from 11/11-11/18 For septic arthritisand endocarditisof the R sternoclavicular joint with sternoclavicular abscess incised and drainedon 02/22/2016, subsequent VACplaced on November 14. [facility]. Cultures grew pseudomonas and comanomas -ID recommended cefepime 2 g every 12 hours for total of 6 weeks, end date was to be12/25/2017 but she left the facility 5 days ago AMA, and without antibiotics. She kept the PICC line Last IV drug use with heroine was 2 days ago. She is now presenting with chest pain, and pain in the dorsal aspect of her hand, suspicious of infection.. Denies any fever or chills night sweats, nausea or vomiting. No diarrhea. PICC line removed, culture showed MRSA.  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 -Recently discharged to nursing home with PICC line on cefepime. -She did have Comamonas bacteremia and Pseudomonas wound infection. -Left from the SNF AMA, admitted that she done heroin and cocaine in the interim. -Patient was started on vancomycin and cefepime. ID was consulted. It was felt initially that she may need intravenous antibiotics long-term and that she will have to stay in the hospital. Blood cultures grew MRSA. Discussed with Dr. Johnnye Sima. Repeat cultures have been without any growth. He recommends that the patient can be switched over to oral antibiotics which she will need to take for at least 2 months, possibly even 3 months. Initially, he recommended doxycycline and Levaquin. However, patient reports allergy to doxycycline. So she was discharged on Bactrim and Levaquin. Patient agreeable with this plan. Patient has been noted to be noncompliant. She continues to use IV drugs. This is a very challenging situation. But this appears to be the best course of action for her.  MRSA bacteremia -2/2 blood cultures showing MRSA, PICC line removed. Repeat cultures have been negative. She was treated with vancomycin and cefepime while she was hospitalized. Will be discharged on oral antibiotics as discussed above. 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  Tricuspid valve endocarditis  -TEE doneon 11/13and showed small vegetation involving the septal leaflet of the tricuspid valve. Vegetation measures 0.80.2 cm without evidence of tricuspid valve deterioration.   Septic pulmonary embolism Recent CT showed 2 nodules one of1.7 cm and the other 0.6 cm suspicious for septic emboli in the RML. TEE done and showed tricuspid valve endocarditis, septic to the lungs. New CXR shows  in addition possible RUL PNA.   Anemia, normocytic  Hemoglobin is stable.  History of IV drug use with heroin Patient has been  counseled  Hepatitis C  Appears to be has chronic hepatitis, no transaminitis or coagulopathy. HIV nonreactive. Follow-up with ID clinic as outpatient.  Overall, stable. Afebrile. Repeat cultures negative. Best strategy at this time is to use oral antibiotics, considering patient's IV drug use and noncompliance. Okay for discharge.    PERTINENT LABS:  The results of significant diagnostics from this hospitalization (including imaging, microbiology, ancillary and laboratory) are listed below for reference.    Microbiology: Recent Results (from the past 240 hour(s))  Blood culture (routine x 2)     Status: Abnormal   Collection Time: 03/17/16 11:07 AM  Result Value Ref Range Status   Specimen Description BLOOD LEFT ANTECUBITAL  Final   Special Requests BOTTLES DRAWN AEROBIC AND ANAEROBIC 5CC  Final   Culture  Setup Time   Final    GRAM POSITIVE COCCI IN CLUSTERS ANAEROBIC BOTTLE ONLY CRITICAL RESULT CALLED TO, READ BACK BY AND VERIFIED WITH: E MARTIN 03/18/16 @ 0956 M VESTAL GRAM POSITIVE RODS AEROBIC BOTTLE ONLY CRITICAL RESULT CALLED TO, READ BACK BY AND VERIFIED WITH: E. MARTIN PHARMD, AT 123012/7/17 BY D. VANHOOK    Culture (A)  Final    METHICILLIN RESISTANT STAPHYLOCOCCUS AUREUS DIPHTHEROIDS(CORYNEBACTERIUM SPECIES) Standardized susceptibility testing for this organism is not available.    Report Status 03/20/2016 FINAL  Final   Organism ID, Bacteria METHICILLIN RESISTANT STAPHYLOCOCCUS AUREUS  Final      Susceptibility   Methicillin resistant staphylococcus aureus - MIC*    CIPROFLOXACIN >=8 RESISTANT Resistant     ERYTHROMYCIN >=8 RESISTANT Resistant     GENTAMICIN <=0.5 SENSITIVE Sensitive     OXACILLIN >=4 RESISTANT Resistant     TETRACYCLINE <=1 SENSITIVE Sensitive     VANCOMYCIN <=0.5 SENSITIVE Sensitive     TRIMETH/SULFA <=10 SENSITIVE Sensitive     CLINDAMYCIN >=8 RESISTANT Resistant     RIFAMPIN <=0.5 SENSITIVE Sensitive     Inducible Clindamycin NEGATIVE  Sensitive     * METHICILLIN RESISTANT STAPHYLOCOCCUS AUREUS  Blood Culture ID Panel (Reflexed)     Status: Abnormal   Collection Time: 03/17/16 11:07 AM  Result Value Ref Range Status   Enterococcus species NOT DETECTED NOT DETECTED Final   Listeria monocytogenes NOT DETECTED NOT DETECTED Final   Staphylococcus species DETECTED (A) NOT DETECTED Final    Comment: CRITICAL RESULT CALLED TO, READ BACK BY AND VERIFIED WITH: E MARTIN 03/18/16 @ 0956 M VESTAL    Staphylococcus aureus DETECTED (A) NOT DETECTED Final    Comment: CRITICAL RESULT CALLED TO, READ BACK BY AND VERIFIED WITH: E MARTIN 03/18/16 @ 0956 M VESTAL    Methicillin resistance DETECTED (A) NOT DETECTED Final    Comment: CRITICAL RESULT CALLED TO, READ BACK BY AND VERIFIED WITH: E MARTIN 03/18/16 @ 0956 M VESTAL    Streptococcus species NOT DETECTED NOT DETECTED Final   Streptococcus agalactiae NOT DETECTED NOT DETECTED Final   Streptococcus pneumoniae NOT DETECTED NOT DETECTED Final   Streptococcus pyogenes NOT DETECTED NOT DETECTED Final   Acinetobacter baumannii NOT DETECTED NOT DETECTED Final   Enterobacteriaceae species NOT DETECTED NOT DETECTED Final   Enterobacter cloacae complex NOT DETECTED NOT DETECTED Final   Escherichia coli NOT DETECTED NOT DETECTED Final   Klebsiella oxytoca NOT DETECTED NOT DETECTED Final   Klebsiella pneumoniae NOT DETECTED NOT DETECTED Final   Proteus species NOT DETECTED NOT  DETECTED Final   Serratia marcescens NOT DETECTED NOT DETECTED Final   Haemophilus influenzae NOT DETECTED NOT DETECTED Final   Neisseria meningitidis NOT DETECTED NOT DETECTED Final   Pseudomonas aeruginosa NOT DETECTED NOT DETECTED Final   Candida albicans NOT DETECTED NOT DETECTED Final   Candida glabrata NOT DETECTED NOT DETECTED Final   Candida krusei NOT DETECTED NOT DETECTED Final   Candida parapsilosis NOT DETECTED NOT DETECTED Final   Candida tropicalis NOT DETECTED NOT DETECTED Final  Blood Culture ID  Panel (Reflexed)     Status: None   Collection Time: 03/17/16 11:07 AM  Result Value Ref Range Status   Enterococcus species NOT DETECTED NOT DETECTED Final   Listeria monocytogenes NOT DETECTED NOT DETECTED Final   Staphylococcus species NOT DETECTED NOT DETECTED Final   Staphylococcus aureus NOT DETECTED NOT DETECTED Final   Streptococcus species NOT DETECTED NOT DETECTED Final   Streptococcus agalactiae NOT DETECTED NOT DETECTED Final   Streptococcus pneumoniae NOT DETECTED NOT DETECTED Final   Streptococcus pyogenes NOT DETECTED NOT DETECTED Final   Acinetobacter baumannii NOT DETECTED NOT DETECTED Final   Enterobacteriaceae species NOT DETECTED NOT DETECTED Final   Enterobacter cloacae complex NOT DETECTED NOT DETECTED Final   Escherichia coli NOT DETECTED NOT DETECTED Final   Klebsiella oxytoca NOT DETECTED NOT DETECTED Final   Klebsiella pneumoniae NOT DETECTED NOT DETECTED Final   Proteus species NOT DETECTED NOT DETECTED Final   Serratia marcescens NOT DETECTED NOT DETECTED Final   Haemophilus influenzae NOT DETECTED NOT DETECTED Final   Neisseria meningitidis NOT DETECTED NOT DETECTED Final   Pseudomonas aeruginosa NOT DETECTED NOT DETECTED Final   Candida albicans NOT DETECTED NOT DETECTED Final   Candida glabrata NOT DETECTED NOT DETECTED Final   Candida krusei NOT DETECTED NOT DETECTED Final   Candida parapsilosis NOT DETECTED NOT DETECTED Final   Candida tropicalis NOT DETECTED NOT DETECTED Final  Urine culture     Status: Abnormal   Collection Time: 03/17/16 12:38 PM  Result Value Ref Range Status   Specimen Description URINE, RANDOM  Final   Special Requests NONE  Final   Culture MULTIPLE SPECIES PRESENT, SUGGEST RECOLLECTION (A)  Final   Report Status 03/18/2016 FINAL  Final  Blood culture (routine x 2)     Status: None   Collection Time: 03/17/16  1:30 PM  Result Value Ref Range Status   Specimen Description BLOOD RIGHT WRIST  Final   Special Requests IN  PEDIATRIC BOTTLE 2CC  Final   Culture NO GROWTH 5 DAYS  Final   Report Status 03/22/2016 FINAL  Final  Culture, blood (Routine X 2) w Reflex to ID Panel     Status: None   Collection Time: 03/18/16  2:06 PM  Result Value Ref Range Status   Specimen Description BLOOD RIGHT HAND  Final   Special Requests IN PEDIATRIC BOTTLE 1CC  Final   Culture NO GROWTH 5 DAYS  Final   Report Status 03/23/2016 FINAL  Final     Labs: Basic Metabolic Panel:  Recent Labs Lab 03/19/16 0522 03/23/16 0533  NA 138 138  K 3.7 4.3  CL 105 105  CO2 23 25  GLUCOSE 102* 117*  BUN 9 11  CREATININE 0.68 0.76  CALCIUM 9.0 9.0   CBC:  Recent Labs Lab 03/19/16 0522  WBC 9.2  HGB 9.8*  HCT 30.3*  MCV 82.6  PLT 271     IMAGING STUDIES X-ray Chest Pa And Lateral  Result Date: 03/18/2016 CLINICAL  DATA:  Weakness and chest congestion without cough or fever. PICC line removal yesterday. History of COPD, current smoker, pulmonary embolism, hepatitis-C, IV drug use and polysubstance abuse. EXAM: CHEST  2 VIEW COMPARISON:  Portable chest x-ray of March 17, 2016 FINDINGS: The lungs are adequately inflated. There is mild interstitial prominence in the right lower lung with new small right pleural effusion. There is a trace of pleural fluid on the left as well. No infiltrate on the left is demonstrated. The heart and pulmonary vascularity are normal. The mediastinum is normal in width. The bony thorax exhibits no acute abnormality. IMPRESSION: Probable atelectasis at the right lung base. Small right pleural effusion and trace left pleural effusion. No discrete alveolar pneumonia. No CHF. Given the patient's clinical history and findings, follow-up chest x-ray in 2-3 weeks following anticipated antibiotic therapy is recommended to assure clearing Electronically Signed   By: David  Martinique M.D.   On: 03/18/2016 08:18   US Renal  Result Date: 02/24/2016 CLINICAL DATA:  Acute renal injury EXAM: RENAL / URINARY TRACT  ULTRASOUND COMPLETE COMPARISON:  CT abdomen and pelvis December 18, 2013 FINDINGS: Right Kidney: Length: 14.6 cm. Echogenicity is increased. Renal cortical thickness is normal. No mass, perinephric fluid, or hydronephrosis visualized. No sonographically demonstrable calculus or ureterectasis. Left Kidney: Length: 13.7 cm. Echogenicity is increased. Renal cortical thickness is normal. No mass or hydronephrosis visualized. There is mild perinephric fluid on the left. No sonographically demonstrable calculus or ureterectasis. Bladder: Appears normal for degree of bladder distention. IMPRESSION: Mild perinephric fluid on the left, a finding of uncertain etiology. This finding could be indicative of early pyelonephritis on the left. No renal abscess seen. Both kidneys are echogenic, consistent with medical renal disease. No hydronephrosis noted on either side. Electronically Signed   By: Lowella Grip III M.D.   On: 02/24/2016 17:25   Dg Chest Port 1 View  Result Date: 03/17/2016 CLINICAL DATA:  Pt c/o cough and SOB x 1 week. Pt has a wound vac in the center of her chest. MD states PICC line is going to come out. Hx of COPD, PE, AND hepatitis C. Pt is a current smoker. EXAM: PORTABLE CHEST 1 VIEW COMPARISON:  Earlier today at 1233 hours FINDINGS: 1326 hours. Interval removal of left-sided PICC line. Numerous leads and wires project over the chest. Midline trachea. Normal heart size. Remote posterior lateral left sixth rib trauma. Improvement in right upper lobe airspace disease. IMPRESSION: Improvement in right upper lobe airspace disease, which remains suspicious for pneumonia. Removal of left PICC line. Electronically Signed   By: Abigail Miyamoto M.D.   On: 03/17/2016 13:35   Dg Chest Port 1 View  Result Date: 03/17/2016 CLINICAL DATA:  Redness around the wound on central chest. Evaluate PICC line. Shortness breath and cough. EXAM: PORTABLE CHEST 1 VIEW COMPARISON:  05/03/2015.  Chest CT 02/21/2016. FINDINGS:  Left PICC line is in place with the tip in the upper right atrium approximately 3 cm below the cavoatrial junction. Heart is normal size. Left lung is clear. Airspace disease in the right upper lobe concerning for pneumonia. No effusions. IMPRESSION: Left PICC line tip in the right atrium 3 cm below the cavoatrial junction. Right upper lobe opacity concerning for pneumonia. Electronically Signed   By: Rolm Baptise M.D.   On: 03/17/2016 12:38    DISCHARGE EXAMINATION: Vitals:   03/24/16 2145 03/24/16 2221 03/25/16 0501 03/25/16 0547  BP: 108/63  (!) 95/52 109/63  Pulse: (!) 56  (!) 56 Marland Kitchen)  56  Resp: (!) 8 16 18    Temp: 98 F (36.7 C)  97.8 F (36.6 C)   TempSrc: Oral  Oral   SpO2: 100%  99%   Weight:      Height:       General appearance: alert, cooperative, appears stated age and no distress Resp: clear to auscultation bilaterally Cardio: regular rate and rhythm, S1, S2 normal, no murmur, click, rub or gallop GI: soft, non-tender; bowel sounds normal; no masses,  no organomegaly  DISPOSITION: Home  Discharge Instructions    Call MD for:  extreme fatigue    Complete by:  As directed    Call MD for:  persistant dizziness or light-headedness    Complete by:  As directed    Call MD for:  persistant nausea and vomiting    Complete by:  As directed    Call MD for:  severe uncontrolled pain    Complete by:  As directed    Call MD for:  temperature >100.4    Complete by:  As directed    Discharge instructions    Complete by:  As directed    Please be sure to follow-up with Dr. Johnnye Sima in 1 month. Please take all of your medications as prescribed.  You were cared for by a hospitalist during your hospital stay. If you have any questions about your discharge medications or the care you received while you were in the hospital after you are discharged, you can call the unit and asked to speak with the hospitalist on call if the hospitalist that took care of you is not available. Once you are  discharged, your primary care physician will handle any further medical issues. Please note that NO REFILLS for any discharge medications will be authorized once you are discharged, as it is imperative that you return to your primary care physician (or establish a relationship with a primary care physician if you do not have one) for your aftercare needs so that they can reassess your need for medications and monitor your lab values. If you do not have a primary care physician, you can call [phone] for a physician referral.   Increase activity slowly    Complete by:  As directed       ALLERGIES:  Allergies  Allergen Reactions  . Doxycycline Other (See Comments)    burns  . Mango Flavor Hives and Swelling  . Pyridium [Phenazopyridine Hcl] Other (See Comments)    Irritation to skin  . Tramadol Nausea And Vomiting     Discharge Medication List as of 03/25/2016 12:29 PM    START taking these medications   Details  levofloxacin (LEVAQUIN) 500 MG tablet Take 1 tablet (500 mg total) by mouth daily. For 2 months, Starting Fri 03/26/2016, Print    sulfamethoxazole-trimethoprim (BACTRIM DS,SEPTRA DS) 800-160 MG tablet Take 1 tablet by mouth every 12 (twelve) hours. For 2 months, Starting Thu 03/25/2016, Print      CONTINUE these medications which have CHANGED   Details  Multiple Vitamins-Minerals (DECUBI-VITE) CAPS Take 1 cap daily, Print    ondansetron (ZOFRAN) 4 MG tablet Take 1 tablet (4 mg total) by mouth every 8 (eight) hours as needed for nausea or vomiting., Starting Thu 03/25/2016, Print    oxyCODONE-acetaminophen (PERCOCET/ROXICET) 5-325 MG tablet Take 1-2 tablets by mouth every 4 (four) hours as needed for moderate pain., Starting Thu 03/25/2016, Print    polyethylene glycol (MIRALAX / GLYCOLAX) packet Take 17 g by mouth daily as needed  for mild constipation., Starting Thu 03/25/2016, Print    vitamin C (ASCORBIC ACID) 500 MG tablet Take 1 tablet (500 mg total) by mouth daily.,  Starting Thu 03/25/2016, Print    zinc sulfate 220 (50 Zn) MG capsule Take 1 capsule (220 mg total) by mouth daily., Starting Thu 03/25/2016, Print      STOP taking these medications     ceFEPIme 2 g in dextrose 5 % 50 mL      cloNIDine (CATAPRES) 0.1 MG tablet          Follow-up Information    [facility]. Go on 04/16/2016.   Why:  Post hospital follow up scheduled on 04/16/2016 at 2pm with [place] Hollis NP Contact information: [place] [phone]       [facility] Follow up.   Why:  Home health RN arranged Contact information: [address] [place] [address] [phone]        [name], MD. Schedule an appointment as soon as possible for a visit on 04/27/2016.   Specialty:  Infectious Diseases Why:  at 4:00pm  Contact information: [address] [place] [address] [phone]           TOTAL DISCHARGE TIME: 35 mins  [facility] Pager [phone]  03/25/2016, 3:16 PM

## 2016-03-25 NOTE — Care Management Note (Signed)
Case Management Note  Patient Details  Name: LAYTON SOZIO MRN: [id number] Date of Birth: 1970/10/08  Subjective/Objective:            Admitted on 03/17/2016 with endocarditis and sternal osteomyelitis. Recently hospitalized for septic arthritis and endocarditis. Hx of IVDA.          Action/Plan: Plan is to d/c today with home health services(RN). Pt with sternal wound.  Post hospital follow up scheduled on 04/16/2016 at 2pm with [place] Hollis NP (overflow for [facility]).  Pt also to f/u with ID/ Dr.Hatcher.  Expected Discharge Date:    03/25/2016             Expected Discharge Plan:  [place]  In-House Referral:     Discharge planning Services  CM Consult, Follow-up appt scheduled, [facility] ([facility] (overflow for [facility]  Post Acute Care Choice:    Choice offered to:  Patient  DME Arranged:    DME Agency:     HH Arranged:  RN (referral made with Butch Penny @ [phone]) [place]:  [place]  Status of Service:  Completed, signed off  If discussed at [place] of Stay Meetings, dates discussed:    Additional Comments:  Sharin Mons, RN 03/25/2016, 11:46 AM

## 2016-03-29 ENCOUNTER — Inpatient Hospital Stay: Payer: Self-pay | Admitting: Internal Medicine

## 2016-04-13 ENCOUNTER — Ambulatory Visit: Payer: Medicare Other | Admitting: Thoracic Surgery (Cardiothoracic Vascular Surgery)

## 2016-04-16 ENCOUNTER — Ambulatory Visit: Payer: Self-pay | Admitting: Family Medicine

## 2016-04-27 ENCOUNTER — Inpatient Hospital Stay: Payer: Self-pay | Admitting: Infectious Diseases

## 2016-08-31 ENCOUNTER — Emergency Department (HOSPITAL_COMMUNITY): Payer: Medicare Other

## 2016-08-31 ENCOUNTER — Inpatient Hospital Stay (HOSPITAL_COMMUNITY)
Admission: EM | Admit: 2016-08-31 | Discharge: 2016-09-19 | DRG: 463 | Discharge status: Against Medical Advice | Payer: Medicare Other | Attending: Family Medicine | Admitting: Family Medicine

## 2016-08-31 ENCOUNTER — Encounter (HOSPITAL_COMMUNITY): Payer: Self-pay | Admitting: Emergency Medicine

## 2016-08-31 DIAGNOSIS — B182 Chronic viral hepatitis C: Secondary | ICD-10-CM | POA: Diagnosis present

## 2016-08-31 DIAGNOSIS — Z79899 Other long term (current) drug therapy: Secondary | ICD-10-CM

## 2016-08-31 DIAGNOSIS — Z881 Allergy status to other antibiotic agents status: Secondary | ICD-10-CM

## 2016-08-31 DIAGNOSIS — Z885 Allergy status to narcotic agent status: Secondary | ICD-10-CM | POA: Diagnosis not present

## 2016-08-31 DIAGNOSIS — R079 Chest pain, unspecified: Secondary | ICD-10-CM | POA: Diagnosis not present

## 2016-08-31 DIAGNOSIS — F419 Anxiety disorder, unspecified: Secondary | ICD-10-CM | POA: Diagnosis present

## 2016-08-31 DIAGNOSIS — I368 Other nonrheumatic tricuspid valve disorders: Secondary | ICD-10-CM | POA: Diagnosis not present

## 2016-08-31 DIAGNOSIS — F1190 Opioid use, unspecified, uncomplicated: Secondary | ICD-10-CM | POA: Diagnosis not present

## 2016-08-31 DIAGNOSIS — F329 Major depressive disorder, single episode, unspecified: Secondary | ICD-10-CM | POA: Diagnosis present

## 2016-08-31 DIAGNOSIS — J9811 Atelectasis: Secondary | ICD-10-CM | POA: Diagnosis not present

## 2016-08-31 DIAGNOSIS — J449 Chronic obstructive pulmonary disease, unspecified: Secondary | ICD-10-CM | POA: Diagnosis not present

## 2016-08-31 DIAGNOSIS — I079 Rheumatic tricuspid valve disease, unspecified: Secondary | ICD-10-CM | POA: Diagnosis present

## 2016-08-31 DIAGNOSIS — R51 Headache: Secondary | ICD-10-CM | POA: Diagnosis not present

## 2016-08-31 DIAGNOSIS — Z9119 Patient's noncompliance with other medical treatment and regimen: Secondary | ICD-10-CM

## 2016-08-31 DIAGNOSIS — Z888 Allergy status to other drugs, medicaments and biological substances status: Secondary | ICD-10-CM

## 2016-08-31 DIAGNOSIS — Z86711 Personal history of pulmonary embolism: Secondary | ICD-10-CM

## 2016-08-31 DIAGNOSIS — Z9102 Food additives allergy status: Secondary | ICD-10-CM | POA: Diagnosis not present

## 2016-08-31 DIAGNOSIS — F1490 Cocaine use, unspecified, uncomplicated: Secondary | ICD-10-CM | POA: Diagnosis not present

## 2016-08-31 DIAGNOSIS — M009 Pyogenic arthritis, unspecified: Secondary | ICD-10-CM | POA: Diagnosis not present

## 2016-08-31 DIAGNOSIS — B9562 Methicillin resistant Staphylococcus aureus infection as the cause of diseases classified elsewhere: Secondary | ICD-10-CM | POA: Diagnosis present

## 2016-08-31 DIAGNOSIS — L02213 Cutaneous abscess of chest wall: Secondary | ICD-10-CM | POA: Diagnosis present

## 2016-08-31 DIAGNOSIS — J869 Pyothorax without fistula: Secondary | ICD-10-CM | POA: Diagnosis present

## 2016-08-31 DIAGNOSIS — R0602 Shortness of breath: Secondary | ICD-10-CM | POA: Diagnosis not present

## 2016-08-31 DIAGNOSIS — D473 Essential (hemorrhagic) thrombocythemia: Secondary | ICD-10-CM | POA: Diagnosis not present

## 2016-08-31 DIAGNOSIS — S21109A Unspecified open wound of unspecified front wall of thorax without penetration into thoracic cavity, initial encounter: Secondary | ICD-10-CM | POA: Diagnosis not present

## 2016-08-31 DIAGNOSIS — F17210 Nicotine dependence, cigarettes, uncomplicated: Secondary | ICD-10-CM | POA: Diagnosis present

## 2016-08-31 DIAGNOSIS — Z452 Encounter for adjustment and management of vascular access device: Secondary | ICD-10-CM

## 2016-08-31 DIAGNOSIS — F1910 Other psychoactive substance abuse, uncomplicated: Secondary | ICD-10-CM | POA: Diagnosis present

## 2016-08-31 DIAGNOSIS — Z5321 Procedure and treatment not carried out due to patient leaving prior to being seen by health care provider: Secondary | ICD-10-CM | POA: Diagnosis not present

## 2016-08-31 DIAGNOSIS — F418 Other specified anxiety disorders: Secondary | ICD-10-CM | POA: Diagnosis not present

## 2016-08-31 DIAGNOSIS — L0390 Cellulitis, unspecified: Secondary | ICD-10-CM | POA: Insufficient documentation

## 2016-08-31 DIAGNOSIS — R222 Localized swelling, mass and lump, trunk: Secondary | ICD-10-CM | POA: Diagnosis not present

## 2016-08-31 DIAGNOSIS — I2699 Other pulmonary embolism without acute cor pulmonale: Secondary | ICD-10-CM | POA: Diagnosis not present

## 2016-08-31 DIAGNOSIS — R001 Bradycardia, unspecified: Secondary | ICD-10-CM | POA: Diagnosis not present

## 2016-08-31 DIAGNOSIS — S21101A Unspecified open wound of right front wall of thorax without penetration into thoracic cavity, initial encounter: Secondary | ICD-10-CM

## 2016-08-31 DIAGNOSIS — M869 Osteomyelitis, unspecified: Secondary | ICD-10-CM | POA: Diagnosis present

## 2016-08-31 DIAGNOSIS — Z8614 Personal history of Methicillin resistant Staphylococcus aureus infection: Secondary | ICD-10-CM

## 2016-08-31 DIAGNOSIS — I371 Nonrheumatic pulmonary valve insufficiency: Secondary | ICD-10-CM | POA: Diagnosis not present

## 2016-08-31 DIAGNOSIS — D649 Anemia, unspecified: Secondary | ICD-10-CM | POA: Diagnosis present

## 2016-08-31 DIAGNOSIS — F1123 Opioid dependence with withdrawal: Secondary | ICD-10-CM | POA: Diagnosis present

## 2016-08-31 DIAGNOSIS — O99320 Drug use complicating pregnancy, unspecified trimester: Secondary | ICD-10-CM | POA: Diagnosis present

## 2016-08-31 DIAGNOSIS — B1920 Unspecified viral hepatitis C without hepatic coma: Secondary | ICD-10-CM | POA: Diagnosis present

## 2016-08-31 DIAGNOSIS — F1290 Cannabis use, unspecified, uncomplicated: Secondary | ICD-10-CM | POA: Diagnosis not present

## 2016-08-31 DIAGNOSIS — F4320 Adjustment disorder, unspecified: Secondary | ICD-10-CM | POA: Diagnosis present

## 2016-08-31 LAB — COMPREHENSIVE METABOLIC PANEL
ALT: 9 U/L — ABNORMAL LOW (ref 14–54)
AST: 17 U/L (ref 15–41)
Albumin: 2.5 g/dL — ABNORMAL LOW (ref 3.5–5.0)
Alkaline Phosphatase: 51 U/L (ref 38–126)
Anion gap: 9 (ref 5–15)
BUN: 7 mg/dL (ref 6–20)
CO2: 25 mmol/L (ref 22–32)
Calcium: 8.1 mg/dL — ABNORMAL LOW (ref 8.9–10.3)
Chloride: 98 mmol/L — ABNORMAL LOW (ref 101–111)
Creatinine, Ser: 0.72 mg/dL (ref 0.44–1.00)
GFR calc Af Amer: >60 mL/min (ref >60)
GFR calc non Af Amer: >60 mL/min (ref >60)
Glucose, Bld: 139 mg/dL — ABNORMAL HIGH (ref 65–99)
Potassium: 3.3 mmol/L — ABNORMAL LOW (ref 3.5–5.1)
Sodium: 132 mmol/L — ABNORMAL LOW (ref 135–145)
Total Bilirubin: 0.5 mg/dL (ref 0.3–1.2)
Total Protein: 7.4 g/dL (ref 6.5–8.1)

## 2016-08-31 LAB — URINALYSIS, ROUTINE W REFLEX MICROSCOPIC
Bacteria, UA: NONE SEEN
Bilirubin Urine: NEGATIVE
Glucose, UA: NEGATIVE mg/dL
Ketones, ur: NEGATIVE mg/dL
Nitrite: NEGATIVE
Protein, ur: NEGATIVE mg/dL
Specific Gravity, Urine: 1.025 (ref 1.005–1.030)
pH: 7 (ref 5.0–8.0)

## 2016-08-31 LAB — CBC WITH DIFFERENTIAL/PLATELET
Basophils Absolute: 0 10*3/uL (ref 0.0–0.1)
Basophils Relative: 0 %
Eosinophils Absolute: 0.1 10*3/uL (ref 0.0–0.7)
Eosinophils Relative: 0 %
HCT: 25.4 % — ABNORMAL LOW (ref 36.0–46.0)
Hemoglobin: 8.3 g/dL — ABNORMAL LOW (ref 12.0–15.0)
Lymphocytes Relative: 33 %
Lymphs Abs: 4.4 10*3/uL — ABNORMAL HIGH (ref 0.7–4.0)
MCH: 24.6 pg — ABNORMAL LOW (ref 26.0–34.0)
MCHC: 32.7 g/dL (ref 30.0–36.0)
MCV: 75.4 fL — ABNORMAL LOW (ref 78.0–100.0)
Monocytes Absolute: 1 10*3/uL (ref 0.1–1.0)
Monocytes Relative: 7 %
Neutro Abs: 8 10*3/uL — ABNORMAL HIGH (ref 1.7–7.7)
Neutrophils Relative %: 60 %
Platelets: 287 10*3/uL (ref 150–400)
RBC: 3.37 MIL/uL — ABNORMAL LOW (ref 3.87–5.11)
RDW: 17.2 % — ABNORMAL HIGH (ref 11.5–15.5)
WBC: 13.4 10*3/uL — ABNORMAL HIGH (ref 4.0–10.5)

## 2016-08-31 LAB — RAPID URINE DRUG SCREEN, HOSP PERFORMED
Amphetamines: NOT DETECTED
Barbiturates: NOT DETECTED
Benzodiazepines: NOT DETECTED
Cocaine: POSITIVE — AB
Opiates: POSITIVE — AB
Tetrahydrocannabinol: NOT DETECTED

## 2016-08-31 LAB — I-STAT CG4 LACTIC ACID, ED: Lactic Acid, Venous: 1.87 mmol/L (ref 0.5–1.9)

## 2016-08-31 MED ORDER — IOPAMIDOL (ISOVUE-300) INJECTION 61%
75.0000 mL | Freq: Once | INTRAVENOUS | Status: AC | PRN
  Administered 2016-08-31: 75 mL via INTRAVENOUS

## 2016-08-31 MED ORDER — VANCOMYCIN HCL IN DEXTROSE 1-5 GM/200ML-% IV SOLN
1000.0000 mg | Freq: Once | INTRAVENOUS | Status: AC
  Administered 2016-08-31: 1000 mg via INTRAVENOUS
  Filled 2016-08-31: qty 200

## 2016-08-31 MED ORDER — VANCOMYCIN HCL IN DEXTROSE 1-5 GM/200ML-% IV SOLN
1000.0000 mg | Freq: Two times a day (BID) | INTRAVENOUS | Status: DC
Start: 2016-08-31 — End: 2016-09-04
  Administered 2016-08-31 – 2016-09-04 (×7): 1000 mg via INTRAVENOUS
  Filled 2016-08-31 (×9): qty 200

## 2016-08-31 MED ORDER — POTASSIUM CHLORIDE IN NACL 20-0.9 MEQ/L-% IV SOLN
INTRAVENOUS | Status: AC
  Administered 2016-08-31: 22:00:00 via INTRAVENOUS
  Filled 2016-08-31: qty 1000

## 2016-08-31 MED ORDER — KETOROLAC TROMETHAMINE 30 MG/ML IJ SOLN
30.0000 mg | Freq: Four times a day (QID) | INTRAMUSCULAR | Status: DC | PRN
  Administered 2016-08-31 – 2016-09-01 (×3): 30 mg via INTRAVENOUS
  Filled 2016-08-31 (×3): qty 1

## 2016-08-31 MED ORDER — ONDANSETRON HCL 4 MG/2ML IJ SOLN
4.0000 mg | Freq: Once | INTRAMUSCULAR | Status: AC
  Administered 2016-08-31: 4 mg via INTRAVENOUS
  Filled 2016-08-31: qty 2

## 2016-08-31 MED ORDER — DEXTROSE 5 % IV SOLN
2.0000 g | Freq: Two times a day (BID) | INTRAVENOUS | Status: DC
  Administered 2016-08-31: 2 g via INTRAVENOUS
  Filled 2016-08-31 (×2): qty 2

## 2016-08-31 MED ORDER — ONDANSETRON HCL 4 MG/2ML IJ SOLN: 4.0000 mg | Freq: Four times a day (QID) | INTRAMUSCULAR | Status: DC | PRN

## 2016-08-31 MED ORDER — SODIUM CHLORIDE 0.9 % IV SOLN
INTRAVENOUS | Status: DC
  Administered 2016-08-31: 75 mL/h via INTRAVENOUS

## 2016-08-31 MED ORDER — ONDANSETRON HCL 4 MG PO TABS: 4.0000 mg | ORAL_TABLET | Freq: Four times a day (QID) | ORAL | Status: DC | PRN

## 2016-08-31 MED ORDER — SODIUM CHLORIDE 0.9 % IV BOLUS (SEPSIS)
250.0000 mL | Freq: Once | INTRAVENOUS | Status: AC
  Administered 2016-08-31: 250 mL via INTRAVENOUS

## 2016-08-31 MED ORDER — HYDROMORPHONE HCL 1 MG/ML IJ SOLN
0.5000 mg | Freq: Once | INTRAMUSCULAR | Status: AC
  Administered 2016-08-31: 0.5 mg via INTRAVENOUS
  Filled 2016-08-31: qty 1

## 2016-08-31 NOTE — Progress Notes (Signed)
Pharmacy Antibiotic Note  [name] is a 46 y.o. female admitted on 08/31/2016 with wound infection.  Pharmacy has been consulted for vancomycin and cefepime dosing.  CrCl ~82  Vancomycin trough 15-20  Plan: - vancomycin 1g iv q12h - cefepime 2g iv q12h - monitor renal function and check vancomycin trough when it's appropriate  Height: 5\' 1"  (154.9 cm) Weight: 160 lb (72.6 kg) IBW/kg (Calculated) : 47.8  Temp (24hrs), Avg:98.7 F (37.1 C), Min:98.1 F (36.7 C), Max:99.2 F (37.3 C)   Recent Labs Lab 08/31/16 1438 08/31/16 1457  WBC 13.4*  --   CREATININE 0.72  --   LATICACIDVEN  --  1.87    Estimated Creatinine Clearance: 81.7 mL/min (by C-G formula based on SCr of 0.72 mg/dL).    Allergies  Allergen Reactions  . Doxycycline Other (See Comments)    burns  . Mango Flavor Hives and Swelling  . Pyridium [Phenazopyridine Hcl] Other (See Comments)    Irritation to skin  . Tramadol Nausea And Vomiting    Thank you for allowing pharmacy to be a part of this patient's care.  Brionne Mertz, Tsz-Yin 08/31/2016 10:18 PM

## 2016-08-31 NOTE — H&P (Signed)
History and Physical    [name] GXQ:[id number] DOB: 14-Nov-1970 DOA: 08/31/2016  PCP: [name], [name]  [name] coming from:  Home  Chief Complaint:  Red and swelling to right chest x 3 days with pain   HPI: [name] is a 46 y.o. female with medical history significant of IVDA, septic joint, septic emboli, endocardititis with recent long hospitalization in dec of 2017 for absess to chest wall with septic joint and infected valve comes in with 3 days of developing mass/absess to the same area to right chest wall with redness that has spread down to right breast with associated pain with breathing and moving right shoulder.  Pt reports when she left hospital she was sober until her mother died in 28-May-2022.  She was on sub axone in [place] but transportation became an issue, then her mom died and she has been using heroin since.  She uses at least every other day, is no longer on subaxone.   She does not use needles to her chest, only to her upper extremities.     This is part of note from discharge summary on 03/25/16 by dr Bobbie Stack from [facility] "INITIAL HISTORY: 46 y.o.femalewith a history of IV drug use (primarily heroin), hepatitis C recently admitted from 11/11-11/18 For septic arthritisand endocarditisof the R sternoclavicular joint with sternoclavicular abscess incised and drainedon 02/22/2016, subsequent VACplaced on November 14. [facility]. Cultures grew pseudomonas and comanomas -ID recommended cefepime 2 g every 12 hours for total of 6 weeks, end date was to be12/25/2017 but she left the facility 5 days ago AMA, and without antibiotics. She kept the PICC line Last IV drug use with heroine was 2 days ago. She is now presenting with chest pain, and pain in the dorsal aspect of her hand, suspicious of infection.. Denies any fever or chills night sweats, nausea or vomiting. No diarrhea. PICC line removed, culture showed  MRSA.  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 -Recently discharged to nursing home with PICC line on cefepime. -She did have Comamonas bacteremia and Pseudomonas wound infection. -Left from the SNF AMA, admitted that she done heroin and cocaine in the interim. -[name] was started on vancomycin and cefepime. ID was consulted. It was felt initially that she may need intravenous antibiotics long-term and that she will have to stay in the hospital. Blood cultures grew MRSA. Discussed with Dr. Johnnye Sima. Repeat cultures have been without any growth. He recommends that the [name] can be switched over to oral antibiotics which she will need to take for at least 2 months, possibly even 3 months. Initially, he recommended doxycycline and Levaquin. However, [name] reports allergy to doxycycline. So she was discharged on Bactrim and Levaquin. [name] agreeable with this plan. [name] has been noted to be noncompliant. She continues to use IV drugs. This is a very challenging situation. But this appears to be the best course of action for her.  MRSA bacteremia -2/2 blood cultures showing MRSA, PICC line removed. Repeat cultures have been negative. She was treated with vancomycin and cefepime while she was hospitalized. Will be discharged on oral antibiotics as discussed above. 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  Tricuspid valve endocarditis  -TEE doneon 11/13and showed small vegetation involving the septal leaflet  of the tricuspid valve. Vegetation measures 0.80.2 cm without evidence of tricuspid valve deterioration.   Septic pulmonary embolism Recent CT showed 2 nodules one of1.7 cm and the other 0.6 cm suspicious for septic emboli in the RML. TEE done and  showed tricuspid valve endocarditis, septic to the lungs. New CXR shows in addition possible RUL PNA. "    Review of Systems: As per HPI otherwise 10 point review of systems negative.   Past Medical History:  Diagnosis Date  . Anxiety   . Chronic back pain   . Chronic knee pain   . COPD (chronic obstructive pulmonary disease) ([place])   . DDD (degenerative disc disease)   . Hepatitis C infection 07/13/2014  . IV drug abuse   . Nephrolithiasis   . Panic attacks   . Polysubstance abuse    cocaine, opiates, marijuana, amphetamines, "molly," crystal meth  . Pulmonary embolism [facility])     Past Surgical History:  Procedure Laterality Date  . CESAREAN SECTION    . NOSE SURGERY    . STERNAL WOUND DEBRIDEMENT N/A 02/22/2016   Procedure: IRRIGATION AND DEBRIDEMENT MANUBRIUM ABSCESS with wound cultures;  Surgeon: Melrose Nakayama, MD;  Location: [place];  Service: Thoracic;  Laterality: N/A;  . STERNAL WOUND DEBRIDEMENT N/A 02/23/2016   Procedure: DRESSING CHANGE UNDER ANESTHESIA  WITH WOUND VAC PLACEMENT;  Surgeon: Melrose Nakayama, MD;  Location: [place];  Service: Thoracic;  Laterality: N/A;  . TEE WITHOUT CARDIOVERSION N/A 02/23/2016   Procedure: TRANSESOPHAGEAL ECHOCARDIOGRAM (TEE);  Surgeon: Jerline Pain, MD;  Location: [facility] ENDOSCOPY;  Service: Cardiovascular;  Laterality: N/A;  . TUBAL LIGATION     "tied, burned, clipped"     reports that she has been smoking Cigarettes.  She has a 32.00 pack-year smoking history. She has never used smokeless tobacco. She reports that she uses drugs, including IV, Cocaine, Marijuana, Methamphetamines, Heroin, and Other-see comments. She reports that she does not drink alcohol.  Allergies  Allergen Reactions  . Doxycycline Other (See Comments)    burns  . Mango Flavor Hives and Swelling  . Pyridium [Phenazopyridine Hcl] Other (See Comments)    Irritation to skin  . Tramadol Nausea And Vomiting    Family History  Problem Relation Age of Onset   . COPD Mother   . Hypertension Father   . Heart attack Father   . Diabetes Father   . Arthritis Unknown   . Lung disease Unknown   . Cancer Unknown   . Asthma Unknown     Prior to Admission medications   Not on File    Physical Exam: Vitals:   08/31/16 1700 08/31/16 1730 08/31/16 1800 08/31/16 1830  BP: 119/66 121/66 118/68 111/69  Pulse: 73 73 78 71  Resp: (!) 28 (!) 29 18 (!) 29  Temp:      TempSrc:      SpO2: 96% 95% 99% 96%  Weight:      Height:          Constitutional: NAD, calm, comfortable Vitals:   08/31/16 1700 08/31/16 1730 08/31/16 1800 08/31/16 1830  BP: 119/66 121/66 118/68 111/69  Pulse: 73 73 78 71  Resp: (!) 28 (!) 29 18 (!) 29  Temp:      TempSrc:      SpO2: 96% 95% 99% 96%  Weight:      Height:       Eyes: PERRL, lids and conjunctivae normal ENMT: Mucous membranes are moist. Posterior pharynx clear of any  exudate or lesions.Normal dentition.  Neck: normal, supple, no masses, no thyromegaly Respiratory: clear to auscultation bilaterally, no wheezing, no crackles. Normal respiratory effort. No accessory muscle use.  Cardiovascular: Regular rate and rhythm, no murmurs / rubs / gallops. No extremity edema. 2+ pedal pulses. No carotid bruits.  Abdomen: no tenderness, no masses palpated. No hepatosplenomegaly. Bowel sounds positive.  Musculoskeletal: no clubbing / cyanosis. No joint deformity upper and lower extremities. Good ROM, no contractures. Normal muscle tone.  Skin: absess to right upper cheest wall with associated redness down to right breast c/w absess and cellulitis area has been marked out with skin pen  Neurologic: CN 2-12 grossly intact. Sensation intact, DTR normal. Strength 5/5 in all 4.  Psychiatric: Normal judgment and insight. Alert and oriented x 3. Normal mood.    Labs on Admission: I have personally reviewed following labs and imaging studies  CBC:  Recent Labs Lab 08/31/16 1438  WBC 13.4*  NEUTROABS 8.0*  HGB 8.3*    HCT 25.4*  MCV 75.4*  PLT 627   Basic Metabolic Panel:  Recent Labs Lab 08/31/16 1438  NA 132*  K 3.3*  CL 98*  CO2 25  GLUCOSE 139*  BUN 7  CREATININE 0.72  CALCIUM 8.1*   GFR: Estimated Creatinine Clearance: 81.7 mL/min (by C-G formula based on SCr of 0.72 mg/dL). Liver Function Tests:  Recent Labs Lab 08/31/16 1438  AST 17  ALT 9*  ALKPHOS 51  BILITOT 0.5  PROT 7.4  ALBUMIN 2.5*    Recent Results (from the past 240 hour(s))  Culture, blood (Routine X 2) w Reflex to ID Panel     Status: None (Preliminary result)   Collection Time: 08/31/16  2:55 PM  Result Value Ref Range Status   Specimen Description LEFT ANTECUBITAL  Final   Special Requests   Final    BOTTLES DRAWN AEROBIC AND ANAEROBIC Blood Culture adequate volume   Culture PENDING  Incomplete   Report Status PENDING  Incomplete  Culture, blood (Routine X 2) w Reflex to ID Panel     Status: None (Preliminary result)   Collection Time: 08/31/16  3:04 PM  Result Value Ref Range Status   Specimen Description RIGHT ANTECUBITAL  Final   Special Requests   Final    BOTTLES DRAWN AEROBIC AND ANAEROBIC Blood Culture adequate volume   Culture PENDING  Incomplete   Report Status PENDING  Incomplete     Radiological Exams on Admission: Dg Chest 2 View  Result Date: 08/31/2016 CLINICAL DATA:  46 year old female with right side chest mass and pain. Septic right sternoclavicular and first costo manubrial joint in November 2017. EXAM: CHEST  2 VIEW COMPARISON:  Chest radiographs 03/18/2016 and earlier. Chest CT 02/21/2016. FINDINGS: 5 cm mass like asymmetric right peritracheal region increased density appears probably related to the previously abnormal right sternoclavicular and first rib costochondral junctions. On the lateral view no upper lung mass is evident. Mediastinal contours remain normal. No pneumothorax, pulmonary edema or confluent pulmonary opacity. Small bilateral pleural effusions have resolved since  December. Negative visible bowel gas pattern. IMPRESSION: 1. Right peritracheal region 5 cm masslike asymmetric density is probably related to the previously abnormal right sternoclavicular and first rib costochondral joints. 2. Small bilateral pleural effusions have resolved since December. No acute cardiopulmonary abnormality. Electronically Signed   By: Genevie Ann M.D.   On: 08/31/2016 15:46   Ct Chest W Contrast  Result Date: 08/31/2016 CLINICAL DATA:  Acute onset of shortness of breath. Recurrent  right chest wall abscess. Initial encounter. EXAM: CT CHEST WITH CONTRAST TECHNIQUE: Multidetector CT imaging of the chest was performed during intravenous contrast administration. CONTRAST:  74mL ISOVUE-300 IOPAMIDOL (ISOVUE-300) INJECTION 61% COMPARISON:  CT of the chest performed 02/21/2016, and chest radiograph performed earlier today at 3:19 p.m. FINDINGS: Cardiovascular: The heart is normal in size. The thoracic aorta is unremarkable. The great vessels are grossly unremarkable. Mediastinum/Nodes: A large region of phlegmon and septic effusion is again noted at the right sternoclavicular and right first costomanubrial joints. There is new air within this collection, likely reflecting interval drainage of superficial abscess. The phlegmon measures 8-9 cm in size, with underlying mild bony destruction and diffuse sclerosis again noted, tracking along the right clavicle, right first rib and right side of the sternum. There is mild extension of phlegmon to the superior aspect of the mediastinum. Mild nonspecific soft tissue inflammation tracks along the anterior aspect of the mediastinum. No mediastinal lymphadenopathy is seen. No pericardial effusion is identified. The visualized portions of the thyroid gland are unremarkable. There is mild asymmetric prominence of right axillary nodes, without significant axillary lymphadenopathy. Lungs/Pleura: Mild multifocal right-sided airspace opacity may reflect atelectasis or  possibly pneumonia, including mild opacity underlying the right anterior chest wall phlegmon. No pleural effusion or pneumothorax is seen. No suspicious masses are identified. Upper Abdomen: The visualized portions of the liver are unremarkable. The spleen is mildly enlarged, measuring 13.5 cm in length. The visualized portions of the pancreas, adrenal glands and kidneys are within normal limits. Musculoskeletal: No additional osseous abnormalities are identified. The visualized musculature is unremarkable in appearance. Vague soft tissue inflammation is seen tracking about the right breast, reflecting cellulitis. IMPRESSION: 1. Large region of phlegmon and septic effusion again noted at the right sternoclavicular and right first costomanubrial joints. New air within this collection likely reflects interval drainage of the superficial abscess. The phlegmon measures 8-9 cm, with underlying mild bony destruction and diffuse sclerosis again noted. Sclerosis involves the right clavicle, right first rib and right side of the sternum. Mild extension of phlegmon to the superior aspect of the mediastinum. 2. Mild right-sided airspace opacity may reflect atelectasis or possibly mild pneumonia, including mild opacity underlying the right anterior chest wall phlegmon. 3. Vague soft tissue inflammation tracking about the right breast, reflecting cellulitis. 4. Mild asymmetric prominence of right axillary nodes, without significant axillary lymphadenopathy. 5. Mild splenomegaly. Electronically Signed   By: Garald Balding M.D.   On: 08/31/2016 18:43    EKG: Independently reviewed. nsr no acute issues Old chart reviewed Case discussed with edp cxr reviewed mass to right upper peritracheal area  Assessment/Plan 46 yo female h/o IVDA with recurrent absess/septic joint right sternoclavicular joint with cellulitis  Principal Problem:   Septic arthritis of right sternoclavicular joint ([place])- she will likely need to go to  OR.  [facility].  Stable at this time.  Will keep npo for now and hold anticoagulants until surgical plan is clear.  Place on iv vanc and cefepime.  Will likely need to get ID involved again.  Cultures pending.  Active Problems:   Abscess of chest ([place])- as above   Polysubstance abuse- noted   IV drug abuse- noted, pt will likely withdraw from heroin, she is aware of this   Hepatitis C infection- noted   COPD (chronic obstructive pulmonary disease) ([facility])- stable at this time   Endocarditis of tricuspid valve- order cardiac echo  to reassess.    DVT prophylaxis:  scds Code Status:  full Family Communication: none Disposition Plan:  Per day team Consults called:  CT surgery Admission status:  admission   Janelis Stelzer A MD [facility]  If 7PM-7AM, please contact night-coverage [email or web address] Password [facility]  08/31/2016, 7:34 PM

## 2016-08-31 NOTE — ED Provider Notes (Signed)
[facility] DEPT Provider Note   CSN: [id number] Arrival date & time: 08/31/16  1204     History   Chief Complaint Chief Complaint  Patient presents with  . Abscess    HPI [name] is a 46 y.o. female.  Patient with the complaint of an abscess to her right chest that flared up in the past 2 days. Lots of redness around the area. Patient admits to still eat abusing IV heroin. Patient was admitted for similar problem back in November 2017 had the area opened and drained. Was admitted to [facility] seen by cardiothoracic surgery. Review of that admission shows that she also had the finding of tricuspid endocarditis. Patient has a history of hepatitis C.      Past Medical History:  Diagnosis Date  . Anxiety   . Chronic back pain   . Chronic knee pain   . COPD (chronic obstructive pulmonary disease) ([place])   . DDD (degenerative disc disease)   . Hepatitis C infection 07/13/2014  . IV drug abuse   . Nephrolithiasis   . Panic attacks   . Polysubstance abuse    cocaine, opiates, marijuana, amphetamines, "molly," crystal meth  . Pulmonary embolism [facility])     Patient Active Problem List   Diagnosis Date Noted  . Wound infection 03/17/2016  . Chest pain at rest   . 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 Anxiety 02/22/2016  . Cigarette smoker 02/22/2016  . Abscess of chest ([place]) 02/21/2016  . Opioid abuse 02/21/2016  . Pulmonary embolism, septic ([place]) 02/21/2016  . Septic arthritis of right sternoclavicular joint ([place]) 02/21/2016  . COPD (chronic obstructive pulmonary disease) ([place]) 10/03/2014  . Hepatitis C infection 07/13/2014  . IV drug abuse   . Depression 06/17/2014  . Polysubstance abuse 06/16/2014    Class: Chronic  . Obesity 06/06/2012    Past Surgical History:  Procedure Laterality Date  . CESAREAN SECTION    . NOSE SURGERY    . STERNAL WOUND DEBRIDEMENT N/A 02/22/2016   Procedure: IRRIGATION AND DEBRIDEMENT MANUBRIUM ABSCESS with wound cultures;   Surgeon: Melrose Nakayama, MD;  Location: [place];  Service: Thoracic;  Laterality: N/A;  . STERNAL WOUND DEBRIDEMENT N/A 02/23/2016   Procedure: DRESSING CHANGE UNDER ANESTHESIA  WITH WOUND VAC PLACEMENT;  Surgeon: Melrose Nakayama, MD;  Location: [place];  Service: Thoracic;  Laterality: N/A;  . TEE WITHOUT CARDIOVERSION N/A 02/23/2016   Procedure: TRANSESOPHAGEAL ECHOCARDIOGRAM (TEE);  Surgeon: Jerline Pain, MD;  Location: [facility] ENDOSCOPY;  Service: Cardiovascular;  Laterality: N/A;  . TUBAL LIGATION     "tied, burned, clipped"    OB History    Gravida Para Term Preterm AB Living   2 2 2     2    SAB TAB Ectopic Multiple Live Births                   Home Medications    Prior to Admission medications   Not on File    Family History Family History  Problem Relation Age of Onset  . COPD Mother   . Hypertension Father   . Heart attack Father   . Diabetes Father   . Arthritis Unknown   . Lung disease Unknown   . Cancer Unknown   . Asthma Unknown     Social History Social History  Substance Use Topics  . Smoking status: Current Every Day Smoker    Packs/day: 1.00    Years: 32.00    Types:  Cigarettes  . Smokeless tobacco: Never Used  . Alcohol use No     Allergies   Doxycycline; Mango flavor; Pyridium [phenazopyridine hcl]; and Tramadol   Review of Systems Review of Systems  Constitutional: Negative for fever.  HENT: Negative for congestion.   Eyes: Negative for redness.  Respiratory: Negative for shortness of breath.   Cardiovascular: Positive for chest pain.  Gastrointestinal: Negative for abdominal pain.  Genitourinary: Negative for dysuria.  Musculoskeletal: Negative for back pain.  Skin: Positive for color change.  Allergic/Immunologic: Positive for immunocompromised state.  Neurological: Negative for headaches.  Hematological: Does not bruise/bleed easily.  Psychiatric/Behavioral: Negative for confusion.     Physical Exam Updated Vital  Signs BP 111/69   Pulse 71   Temp 98.1 F (36.7 C) (Oral)   Resp (!) 29   Ht 1.549 m (5\' 1" )   Wt 72.6 kg (160 lb)   LMP 07/01/2016   SpO2 96%   BMI 30.23 kg/m   Physical Exam  Constitutional: She is oriented to person, place, and time. She appears well-developed and well-nourished. No distress.  HENT:  Head: Normocephalic and atraumatic.  Mouth/Throat: Oropharynx is clear and moist.  Eyes: Conjunctivae and EOM are normal. Pupils are equal, round, and reactive to light.  Neck: Normal range of motion. Neck supple.  Cardiovascular: Normal rate and regular rhythm.   No murmur heard. No murmur noted.  Pulmonary/Chest: Breath sounds normal. She exhibits tenderness.  Patient with scar tissue at the right clavicular sternal area. Scar measures about 5 cm. About a 3 x 3 area of fluctuance large surrounding area of erythema measuring probably 15 x 20 cm. The fluctuance is directly over the previous scar. Her slightly superior to it.  Abdominal: Soft. Bowel sounds are normal. There is no tenderness.  Musculoskeletal: Normal range of motion.  Neurological: She is alert and oriented to person, place, and time. No cranial nerve deficit or sensory deficit. She exhibits normal muscle tone. Coordination normal.  Skin: Skin is warm. There is erythema.  Nursing note and vitals reviewed.    ED Treatments / Results  Labs (all labs ordered are listed, but only abnormal results are displayed) Labs Reviewed  CBC WITH DIFFERENTIAL/PLATELET - Abnormal; Notable for the following:       Result Value   WBC 13.4 (*)    RBC 3.37 (*)    Hemoglobin 8.3 (*)    HCT 25.4 (*)    MCV 75.4 (*)    MCH 24.6 (*)    RDW 17.2 (*)    Neutro Abs 8.0 (*)    Lymphs Abs 4.4 (*)    All other components within normal limits  COMPREHENSIVE METABOLIC PANEL - Abnormal; Notable for the following:    Sodium 132 (*)    Potassium 3.3 (*)    Chloride 98 (*)    Glucose, Bld 139 (*)    Calcium 8.1 (*)    Albumin 2.5 (*)     ALT 9 (*)    All other components within normal limits  CULTURE, BLOOD (ROUTINE X 2)  CULTURE, BLOOD (ROUTINE X 2)  URINALYSIS, ROUTINE W REFLEX MICROSCOPIC  RAPID URINE DRUG SCREEN, HOSP PERFORMED  I-STAT CG4 LACTIC ACID, ED   Results for orders placed or performed during the hospital encounter of 08/31/16  Culture, blood (Routine X 2) w Reflex to ID Panel  Result Value Ref Range   Specimen Description LEFT ANTECUBITAL    Special Requests      BOTTLES DRAWN AEROBIC AND  ANAEROBIC Blood Culture adequate volume   Culture PENDING    Report Status PENDING   Culture, blood (Routine X 2) w Reflex to ID Panel  Result Value Ref Range   Specimen Description RIGHT ANTECUBITAL    Special Requests      BOTTLES DRAWN AEROBIC AND ANAEROBIC Blood Culture adequate volume   Culture PENDING    Report Status PENDING   CBC with Differential/Platelet  Result Value Ref Range   WBC 13.4 (H) 4.0 - 10.5 K/uL   RBC 3.37 (L) 3.87 - 5.11 MIL/uL   Hemoglobin 8.3 (L) 12.0 - 15.0 g/dL   HCT 25.4 (L) 36.0 - 46.0 %   MCV 75.4 (L) 78.0 - 100.0 fL   MCH 24.6 (L) 26.0 - 34.0 pg   MCHC 32.7 30.0 - 36.0 g/dL   RDW 17.2 (H) 11.5 - 15.5 %   Platelets 287 150 - 400 K/uL   Neutrophils Relative % 60 %   Neutro Abs 8.0 (H) 1.7 - 7.7 K/uL   Lymphocytes Relative 33 %   Lymphs Abs 4.4 (H) 0.7 - 4.0 K/uL   Monocytes Relative 7 %   Monocytes Absolute 1.0 0.1 - 1.0 K/uL   Eosinophils Relative 0 %   Eosinophils Absolute 0.1 0.0 - 0.7 K/uL   Basophils Relative 0 %   Basophils Absolute 0.0 0.0 - 0.1 K/uL   WBC Morphology ATYPICAL LYMPHOCYTES   Comprehensive metabolic panel  Result Value Ref Range   Sodium 132 (L) 135 - 145 mmol/L   Potassium 3.3 (L) 3.5 - 5.1 mmol/L   Chloride 98 (L) 101 - 111 mmol/L   CO2 25 22 - 32 mmol/L   Glucose, Bld 139 (H) 65 - 99 mg/dL   BUN 7 6 - 20 mg/dL   Creatinine, Ser 0.72 0.44 - 1.00 mg/dL   Calcium 8.1 (L) 8.9 - 10.3 mg/dL   Total Protein 7.4 6.5 - 8.1 g/dL   Albumin 2.5 (L)  3.5 - 5.0 g/dL   AST 17 15 - 41 U/L   ALT 9 (L) 14 - 54 U/L   Alkaline Phosphatase 51 38 - 126 U/L   Total Bilirubin 0.5 0.3 - 1.2 mg/dL   GFR calc non Af Amer >60 >60 mL/min   GFR calc Af Amer >60 >60 mL/min   Anion gap 9 5 - 15  I-Stat CG4 Lactic Acid, ED  Result Value Ref Range   Lactic Acid, Venous 1.87 0.5 - 1.9 mmol/L     EKG  EKG Interpretation  Date/Time:  Tuesday Aug 31 2016 14:50:24 EDT Ventricular Rate:  79 PR Interval:    QRS Duration: 98 QT Interval:  383 QTC Calculation: 439 R Axis:   60 Text Interpretation:  Sinus rhythm No significant change since last tracing Confirmed by Fredia Sorrow [phone]) on 08/31/2016 3:15:04 PM       Radiology Dg Chest 2 View  Result Date: 08/31/2016 CLINICAL DATA:  46 year old female with right side chest mass and pain. Septic right sternoclavicular and first costo manubrial joint in November 2017. EXAM: CHEST  2 VIEW COMPARISON:  Chest radiographs 03/18/2016 and earlier. Chest CT 02/21/2016. FINDINGS: 5 cm mass like asymmetric right peritracheal region increased density appears probably related to the previously abnormal right sternoclavicular and first rib costochondral junctions. On the lateral view no upper lung mass is evident. Mediastinal contours remain normal. No pneumothorax, pulmonary edema or confluent pulmonary opacity. Small bilateral pleural effusions have resolved since December. Negative visible bowel gas pattern. IMPRESSION: 1. Right  peritracheal region 5 cm masslike asymmetric density is probably related to the previously abnormal right sternoclavicular and first rib costochondral joints. 2. Small bilateral pleural effusions have resolved since December. No acute cardiopulmonary abnormality. Electronically Signed   By: Genevie Ann M.D.   On: 08/31/2016 15:46   Ct Chest W Contrast  Result Date: 08/31/2016 CLINICAL DATA:  Acute onset of shortness of breath. Recurrent right chest wall abscess. Initial encounter. EXAM: CT CHEST  WITH CONTRAST TECHNIQUE: Multidetector CT imaging of the chest was performed during intravenous contrast administration. CONTRAST:  21mL ISOVUE-300 IOPAMIDOL (ISOVUE-300) INJECTION 61% COMPARISON:  CT of the chest performed 02/21/2016, and chest radiograph performed earlier today at 3:19 p.m. FINDINGS: Cardiovascular: The heart is normal in size. The thoracic aorta is unremarkable. The great vessels are grossly unremarkable. Mediastinum/Nodes: A large region of phlegmon and septic effusion is again noted at the right sternoclavicular and right first costomanubrial joints. There is new air within this collection, likely reflecting interval drainage of superficial abscess. The phlegmon measures 8-9 cm in size, with underlying mild bony destruction and diffuse sclerosis again noted, tracking along the right clavicle, right first rib and right side of the sternum. There is mild extension of phlegmon to the superior aspect of the mediastinum. Mild nonspecific soft tissue inflammation tracks along the anterior aspect of the mediastinum. No mediastinal lymphadenopathy is seen. No pericardial effusion is identified. The visualized portions of the thyroid gland are unremarkable. There is mild asymmetric prominence of right axillary nodes, without significant axillary lymphadenopathy. Lungs/Pleura: Mild multifocal right-sided airspace opacity may reflect atelectasis or possibly pneumonia, including mild opacity underlying the right anterior chest wall phlegmon. No pleural effusion or pneumothorax is seen. No suspicious masses are identified. Upper Abdomen: The visualized portions of the liver are unremarkable. The spleen is mildly enlarged, measuring 13.5 cm in length. The visualized portions of the pancreas, adrenal glands and kidneys are within normal limits. Musculoskeletal: No additional osseous abnormalities are identified. The visualized musculature is unremarkable in appearance. Vague soft tissue inflammation is seen  tracking about the right breast, reflecting cellulitis. IMPRESSION: 1. Large region of phlegmon and septic effusion again noted at the right sternoclavicular and right first costomanubrial joints. New air within this collection likely reflects interval drainage of the superficial abscess. The phlegmon measures 8-9 cm, with underlying mild bony destruction and diffuse sclerosis again noted. Sclerosis involves the right clavicle, right first rib and right side of the sternum. Mild extension of phlegmon to the superior aspect of the mediastinum. 2. Mild right-sided airspace opacity may reflect atelectasis or possibly mild pneumonia, including mild opacity underlying the right anterior chest wall phlegmon. 3. Vague soft tissue inflammation tracking about the right breast, reflecting cellulitis. 4. Mild asymmetric prominence of right axillary nodes, without significant axillary lymphadenopathy. 5. Mild splenomegaly. Electronically Signed   By: Garald Balding M.D.   On: 08/31/2016 18:43    Procedures Procedures (including critical care time)  Medications Ordered in ED Medications  0.9 %  sodium chloride infusion (75 mL/hr Intravenous New Bag/Given 08/31/16 1615)  sodium chloride 0.9 % bolus 250 mL (0 mLs Intravenous Stopped 08/31/16 1615)  ondansetron (ZOFRAN) injection 4 mg (4 mg Intravenous Given 08/31/16 1515)  vancomycin (VANCOCIN) IVPB 1000 mg/200 mL premix (0 mg Intravenous Stopped 08/31/16 1616)  HYDROmorphone (DILAUDID) injection 0.5 mg (0.5 mg Intravenous Given 08/31/16 1758)  iopamidol (ISOVUE-300) 61 % injection 75 mL (75 mLs Intravenous Contrast Given 08/31/16 1745)     Initial Impression / Assessment and Plan /  ED Course  I have reviewed the triage vital signs and the nursing notes.  Pertinent labs & imaging results that were available during my care of the patient were reviewed by me and considered in my medical decision making (see chart for details).    Patient seems to have a recurrent  septic arthritis in the right clavicular sternal area was identical to what she was admitted for in November 2017. Patient not toxic appearing. Blood cultures done. Started on vancomycin. Patient was on long-term cefepime following that admission part of that was for tricuspid endocarditis.  Patient had blood cultures. Lactic acid was normal. White count slightly elevated. Patient started on vancomycin.  Discussed with cardiothoracic surgery they will consult on the patient. [facility] by [facility]ist service.  Patient does have a mild leukocytosis here no fevers. No septic parameters on vital signs.   Patient has a history of IV drug abuse and is still using. Patient is a heroin user.  As stated above CT chest with contrast shows almost identical findings to her admission in November.  Final Clinical Impressions(s) / ED Diagnoses   Final diagnoses:  Septic arthritis, due to unspecified organism, septic arthritis of unspecified location [facility])    New Prescriptions New Prescriptions   No medications on file     Fredia Sorrow, MD 08/31/16 [phone]

## 2016-08-31 NOTE — ED Triage Notes (Signed)
Patient complains of abscess on right chest x 2 days ago. States history of abscess to right chest.

## 2016-08-31 NOTE — ED Notes (Signed)
Pt gone to CT 

## 2016-08-31 NOTE — ED Notes (Signed)
Report given to [facility]. 

## 2016-09-01 DIAGNOSIS — L02213 Cutaneous abscess of chest wall: Secondary | ICD-10-CM

## 2016-09-01 LAB — CBC
HCT: 26.3 % — ABNORMAL LOW (ref 36.0–46.0)
Hemoglobin: 8.4 g/dL — ABNORMAL LOW (ref 12.0–15.0)
MCH: 23.9 pg — ABNORMAL LOW (ref 26.0–34.0)
MCHC: 31.9 g/dL (ref 30.0–36.0)
MCV: 74.9 fL — ABNORMAL LOW (ref 78.0–100.0)
Platelets: 323 10*3/uL (ref 150–400)
RBC: 3.51 MIL/uL — ABNORMAL LOW (ref 3.87–5.11)
RDW: 17.4 % — ABNORMAL HIGH (ref 11.5–15.5)
WBC: 13.3 10*3/uL — ABNORMAL HIGH (ref 4.0–10.5)

## 2016-09-01 LAB — BASIC METABOLIC PANEL
Anion gap: 7 (ref 5–15)
BUN: <5 mg/dL — ABNORMAL LOW (ref 6–20)
CO2: 24 mmol/L (ref 22–32)
Calcium: 8.2 mg/dL — ABNORMAL LOW (ref 8.9–10.3)
Chloride: 105 mmol/L (ref 101–111)
Creatinine, Ser: 0.76 mg/dL (ref 0.44–1.00)
GFR calc Af Amer: >60 mL/min (ref >60)
GFR calc non Af Amer: >60 mL/min (ref >60)
Glucose, Bld: 111 mg/dL — ABNORMAL HIGH (ref 65–99)
Potassium: 3.7 mmol/L (ref 3.5–5.1)
Sodium: 136 mmol/L (ref 135–145)

## 2016-09-01 LAB — PREPARE RBC (CROSSMATCH)

## 2016-09-01 LAB — MRSA PCR SCREENING: MRSA by PCR: NEGATIVE

## 2016-09-01 MED ORDER — SODIUM CHLORIDE 0.9 % IV SOLN
Freq: Once | INTRAVENOUS | Status: AC
  Administered 2016-09-01: 17:00:00 via INTRAVENOUS

## 2016-09-01 MED ORDER — DEXTROSE 5 % IV SOLN
2.0000 g | Freq: Three times a day (TID) | INTRAVENOUS | Status: DC
  Administered 2016-09-01 – 2016-09-08 (×22): 2 g via INTRAVENOUS
  Filled 2016-09-01 (×24): qty 2

## 2016-09-01 MED ORDER — METHADONE HCL 10 MG PO TABS
5.0000 mg | ORAL_TABLET | Freq: Two times a day (BID) | ORAL | Status: DC
  Administered 2016-09-01 – 2016-09-05 (×8): 5 mg via ORAL
  Filled 2016-09-01 (×8): qty 1

## 2016-09-01 NOTE — Progress Notes (Signed)
Pharmacy Antibiotic Note  [name] is a 46 y.o. female admitted on 08/31/2016 with recurrent abscess/septic joint. 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   Pharmacy has been consulted for vancomycin and cefepime dosing - day #2. Cultures negative to date. SCr stable WNL, CrCl ~83.  Vancomycin trough goal 15-20  Plan: - Continue vancomycin 1g IV q12h - Increase cefepime to 2g IV q8h - Monitor clinical progress, c/s, renal function - F/u de-escalation plan/LOT, vancomycin trough as indicated - F/u Surgery, ID recommendations  Height: 5\' 1"  (154.9 cm) Weight: 164 lb 6.4 oz (74.6 kg) IBW/kg (Calculated) : 47.8  Temp (24hrs), Avg:98.6 F (37 C), Min:98.1 F (36.7 C), Max:99.2 F (37.3 C)   Recent Labs Lab 08/31/16 1438 08/31/16 1457 09/01/16 0230  WBC 13.4*  --  13.3*  CREATININE 0.72  --  0.76  LATICACIDVEN  --  1.87  --     Estimated Creatinine Clearance: 82.9 mL/min (by C-G formula based on SCr of 0.76 mg/dL).    Allergies  Allergen Reactions  . Doxycycline Other (See Comments)    burns  . Mango Flavor Hives and Swelling  . Pyridium [Phenazopyridine Hcl] Other (See Comments)    Irritation to skin  . Tramadol Nausea And Vomiting    Elicia Lamp, PharmD, BCPS Clinical Pharmacist Rx Phone # for today: [phone] After 3:30PM, please call Main Rx: [phone] 09/01/2016 10:11 AM

## 2016-09-01 NOTE — Progress Notes (Signed)
PROGRESS NOTE    [name]  ZWC:[id number] DOB: [birth year] DOA: 08/31/2016 PCP: Patient, [name]    Brief Narrative:  46 y.o. female with medical history significant of IVDA, septic joint, septic emboli, endocardititis with recent long hospitalization in dec of 2017 for absess to chest wall with septic joint and infected valve comes in with 3 days of developing mass/absess to the same area to right chest wall with redness that has spread down to right breast with associated pain with breathing and moving right shoulder.    Assessment & Plan:   Principal Problem:   Septic arthritis of right sternoclavicular joint (HCC)/ Abscess of chest (HCC) - Given pain and history of Heroin use will add methadone to try and control discomfort from active infection site. - continue current IV antibiotics - Cardiothoracic surgeon consulted.  Active Problems:   Polysubstance abuse - Stable currently    IV drug abuse   Hepatitis C infection    COPD (chronic obstructive pulmonary disease) (HCC) - stable currently.    Endocarditis of tricuspid valve   DVT prophylaxis: SCD Code Status: Full Family Communication: none at bedside Disposition Plan: pending improvement in condition   Consultants:   Cardiothoracic surgeon   Procedures: None   Antimicrobials: Cefepime, vancomycin   Subjective: Pt has no new complaints other than discomfort. She is requesting pain medication  Objective: Vitals:   08/31/16 2115 08/31/16 2222 09/01/16 0443 09/01/16 1606  BP:  116/65 114/69 122/68  Pulse:  66 60 (!) 59  Resp:  18 18 18   Temp: 99.2 F (37.3 C) 98.4 F (36.9 C) 98.1 F (36.7 C) 98.4 F (36.9 C)  TempSrc: Oral Oral Oral Oral  SpO2:  95% 95% 98%  Weight:  74.6 kg (164 lb 6.4 oz)    Height:  5\' 1"  (1.549 m)      Intake/Output Summary (Last 24 hours) at 09/01/16 1643 Last data filed at 09/01/16 1236  Gross per 24 hour  Intake           661.25 ml  Output                1 ml    Net           660.25 ml   Filed Weights   08/31/16 1213 08/31/16 2222  Weight: 72.6 kg (160 lb) 74.6 kg (164 lb 6.4 oz)    Examination:  General exam: uncomfortably but in NAD Respiratory system: Clear to auscultation. Respiratory effort normal. Cardiovascular system: S1 & S2 heard, RRR. No JVD, murmurs, rubs, gallops or clicks. No pedal edema. Gastrointestinal system: Abdomen is nondistended, soft and nontender. No organomegaly or masses felt. Normal bowel sounds heard. Central nervous system: Alert and oriented. No focal neurological deficits. Extremities: Symmetric 5 x 5 power. Skin: abscess chest, cellulitis Psychiatry:Mood & affect appropriate.     Data Reviewed: I have personally reviewed following labs and imaging studies  CBC:  Recent Labs Lab 08/31/16 1438 09/01/16 0230  WBC 13.4* 13.3*  NEUTROABS 8.0*  --   HGB 8.3* 8.4*  HCT 25.4* 26.3*  MCV 75.4* 74.9*  PLT 287 235   Basic Metabolic Panel:  Recent Labs Lab 08/31/16 1438 09/01/16 0230  NA 132* 136  K 3.3* 3.7  CL 98* 105  CO2 25 24  GLUCOSE 139* 111*  BUN 7 <5*  CREATININE 0.72 0.76  CALCIUM 8.1* 8.2*   GFR: Estimated Creatinine Clearance: 82.9 mL/min (by C-G formula based on SCr of 0.76 mg/dL).  Liver Function Tests:  Recent Labs Lab 08/31/16 1438  AST 17  ALT 9*  ALKPHOS 51  BILITOT 0.5  PROT 7.4  ALBUMIN 2.5*   No results for input(s): LIPASE, AMYLASE in the last 168 hours. No results for input(s): AMMONIA in the last 168 hours. Coagulation Profile: No results for input(s): INR, PROTIME in the last 168 hours. Cardiac Enzymes: No results for input(s): CKTOTAL, CKMB, CKMBINDEX, TROPONINI in the last 168 hours. BNP (last 3 results) No results for input(s): PROBNP in the last 8760 hours. HbA1C: No results for input(s): HGBA1C in the last 72 hours. CBG: No results for input(s): GLUCAP in the last 168 hours. Lipid Profile: No results for input(s): CHOL, HDL, LDLCALC, TRIG, CHOLHDL,  LDLDIRECT in the last 72 hours. Thyroid Function Tests: No results for input(s): TSH, T4TOTAL, FREET4, T3FREE, THYROIDAB in the last 72 hours. Anemia Panel: No results for input(s): VITAMINB12, FOLATE, FERRITIN, TIBC, IRON, RETICCTPCT in the last 72 hours. Sepsis Labs:  Recent Labs Lab 08/31/16 1457  LATICACIDVEN 1.87    Recent Results (from the past 240 hour(s))  Culture, blood (Routine X 2) w Reflex to ID Panel     Status: None (Preliminary result)   Collection Time: 08/31/16  2:55 PM  Result Value Ref Range Status   Specimen Description LEFT ANTECUBITAL  Final   Special Requests   Final    BOTTLES DRAWN AEROBIC AND ANAEROBIC Blood Culture adequate volume   Culture NO GROWTH < 24 HOURS  Final   Report Status PENDING  Incomplete  Culture, blood (Routine X 2) w Reflex to ID Panel     Status: None (Preliminary result)   Collection Time: 08/31/16  3:04 PM  Result Value Ref Range Status   Specimen Description RIGHT ANTECUBITAL  Final   Special Requests   Final    BOTTLES DRAWN AEROBIC AND ANAEROBIC Blood Culture adequate volume   Culture NO GROWTH < 24 HOURS  Final   Report Status PENDING  Incomplete  MRSA PCR Screening     Status: None   Collection Time: 08/31/16 10:26 PM  Result Value Ref Range Status   MRSA by PCR NEGATIVE NEGATIVE Final    Comment:        The GeneXpert MRSA Assay (FDA approved for NASAL specimens only), is one component of a comprehensive MRSA colonization surveillance program. It is not intended to diagnose MRSA infection nor to guide or monitor treatment for MRSA infections.      Radiology Studies: Dg Chest 2 View  Result Date: 08/31/2016 CLINICAL DATA:  46 year old female with right side chest mass and pain. Septic right sternoclavicular and first costo manubrial joint in November 2017. EXAM: CHEST  2 VIEW COMPARISON:  Chest radiographs 03/18/2016 and earlier. Chest CT 02/21/2016. FINDINGS: 5 cm mass like asymmetric right peritracheal region  increased density appears probably related to the previously abnormal right sternoclavicular and first rib costochondral junctions. On the lateral view no upper lung mass is evident. Mediastinal contours remain normal. No pneumothorax, pulmonary edema or confluent pulmonary opacity. Small bilateral pleural effusions have resolved since December. Negative visible bowel gas pattern. IMPRESSION: 1. Right peritracheal region 5 cm masslike asymmetric density is probably related to the previously abnormal right sternoclavicular and first rib costochondral joints. 2. Small bilateral pleural effusions have resolved since December. No acute cardiopulmonary abnormality. Electronically Signed   By: Genevie Ann M.D.   On: 08/31/2016 15:46   Ct Chest W Contrast  Result Date: 08/31/2016 CLINICAL DATA:  Acute onset  of shortness of breath. Recurrent right chest wall abscess. Initial encounter. EXAM: CT CHEST WITH CONTRAST TECHNIQUE: Multidetector CT imaging of the chest was performed during intravenous contrast administration. CONTRAST:  92mL ISOVUE-300 IOPAMIDOL (ISOVUE-300) INJECTION 61% COMPARISON:  CT of the chest performed 02/21/2016, and chest radiograph performed earlier today at 3:19 p.m. FINDINGS: Cardiovascular: The heart is normal in size. The thoracic aorta is unremarkable. The great vessels are grossly unremarkable. Mediastinum/Nodes: A large region of phlegmon and septic effusion is again noted at the right sternoclavicular and right first costomanubrial joints. There is new air within this collection, likely reflecting interval drainage of superficial abscess. The phlegmon measures 8-9 cm in size, with underlying mild bony destruction and diffuse sclerosis again noted, tracking along the right clavicle, right first rib and right side of the sternum. There is mild extension of phlegmon to the superior aspect of the mediastinum. Mild nonspecific soft tissue inflammation tracks along the anterior aspect of the  mediastinum. No mediastinal lymphadenopathy is seen. No pericardial effusion is identified. The visualized portions of the thyroid gland are unremarkable. There is mild asymmetric prominence of right axillary nodes, without significant axillary lymphadenopathy. Lungs/Pleura: Mild multifocal right-sided airspace opacity may reflect atelectasis or possibly pneumonia, including mild opacity underlying the right anterior chest wall phlegmon. No pleural effusion or pneumothorax is seen. No suspicious masses are identified. Upper Abdomen: The visualized portions of the liver are unremarkable. The spleen is mildly enlarged, measuring 13.5 cm in length. The visualized portions of the pancreas, adrenal glands and kidneys are within normal limits. Musculoskeletal: No additional osseous abnormalities are identified. The visualized musculature is unremarkable in appearance. Vague soft tissue inflammation is seen tracking about the right breast, reflecting cellulitis. IMPRESSION: 1. Large region of phlegmon and septic effusion again noted at the right sternoclavicular and right first costomanubrial joints. New air within this collection likely reflects interval drainage of the superficial abscess. The phlegmon measures 8-9 cm, with underlying mild bony destruction and diffuse sclerosis again noted. Sclerosis involves the right clavicle, right first rib and right side of the sternum. Mild extension of phlegmon to the superior aspect of the mediastinum. 2. Mild right-sided airspace opacity may reflect atelectasis or possibly mild pneumonia, including mild opacity underlying the right anterior chest wall phlegmon. 3. Vague soft tissue inflammation tracking about the right breast, reflecting cellulitis. 4. Mild asymmetric prominence of right axillary nodes, without significant axillary lymphadenopathy. 5. Mild splenomegaly. Electronically Signed   By: Garald Balding M.D.   On: 08/31/2016 18:43    Scheduled Meds: . methadone  5  mg Oral Q12H   Continuous Infusions: . ceFEPime (MAXIPIME) IV 2 g (09/01/16 1405)  . vancomycin Stopped (09/01/16 1252)     LOS: 1 day    Time spent: > 35 minutes  Velvet Bathe, MD [facility] Pager [phone]  If 7PM-7AM, please contact night-coverage [email or web address] Password [facility] 09/01/2016, 4:43 PM

## 2016-09-01 NOTE — Consult Note (Addendum)
[id number]       [place] [id number]             [phone]        Saragrace B Vandekamp [place] Medical Record #[id number] Date of Birth: 03-11-1971  Referring: Dr. Shanon Brow Primary Care: No PCP  Chief Complaint:    Chief Complaint  Patient presents with  . Right sterno clavicular joint abscess    History of Present Illness:     This is a 46 year old Caucasian female known to [facility] from previous I and D of right sternoclavicular abscess 02/21/2017 and dressing change with wound VAC on  11/13 /2018. She was discharged to [facility] on 02/28/2016 on Cefepime for Comamonas acidovorans (Delftia acidovorans) bacteremia. She then left AMA from [facility] and remained non compliant.   She was seen in the ER in December 2017 for right sided pain and redness over SCV joint. and still had a PICC line and dirty dressing on her right upper arm. The wound VAC apparently had been removed prior to her leaving [facility]. PICC line was removed in the ER. She was put on IV Vancomycin and Cefepime. A new PICC line was not placed as patient high risk for infection and non compliant. Infectious disease and wound care were was consulted. Blood cultures showed MRSA  bacteremia and Corynebacterium. Per infectious disease, antibiotics were changed to oral Doxycycline and Levaquin at discharge. It was recommended that she take these for at least 2 months. She was discharged with home health on 03/25/2017. Again, she was non compliant and did not follow up appropriately.   She presented to [facility] ED yesterday with complaints of redness, swelling, and tenderness of the right SCV joint. She did state she has had chills and decreased appetite of late. She also does admit to active abusing IV heroin. She was transferred to [facility] and admitted by medicine. Dr. Cyndia Bent was asked to further evaluate the right SCV joint abcess as Dr. Roxan Hockey is unavailable at this time. At the time of being  examined, she was in no acute distress, had a low grade fever to 99.2. She is on IV Vanco and Cefepime and we will await any further recommendations by infectious disease.   Current Activity/ Functional Status: Patient is independent with mobility/ambulation, transfers, ADL's, IADL's.   Zubrod Score: At the time of surgery this patient's most appropriate activity status/level should be described as: []     0    Normal activity, no symptoms []     1    Restricted in physical strenuous activity but ambulatory, able to do out light work [x]     2    Ambulatory and capable of self care, unable to do work activities, up and about more than 50%  of the time                            []     3    Only limited self care, in bed greater than 50% of waking hours []     4    Completely disabled, no self care, confined to bed or chair []     5    Moribund  Past Medical History:  Diagnosis Date  . Anxiety   . Chronic back pain   . Chronic knee pain   . COPD (chronic obstructive pulmonary disease) ([place])   . DDD (degenerative disc disease)   . Hepatitis  C infection 07/13/2014  . IV drug abuse   . Nephrolithiasis   . Panic attacks   . Polysubstance abuse    cocaine, opiates, marijuana, amphetamines, "molly," crystal meth  . Pulmonary embolism [facility])     Past Surgical History:  Procedure Laterality Date  . CESAREAN SECTION    . NOSE SURGERY    . STERNAL WOUND DEBRIDEMENT N/A 02/22/2016   Procedure: IRRIGATION AND DEBRIDEMENT MANUBRIUM ABSCESS with wound cultures;  Surgeon: Melrose Nakayama, MD;  Location: [facility];  Service: Thoracic;  Laterality: N/A;  . STERNAL WOUND DEBRIDEMENT N/A 02/23/2016   Procedure: DRESSING CHANGE UNDER ANESTHESIA  WITH WOUND VAC PLACEMENT;  Surgeon: Melrose Nakayama, MD;  Location: [facility];  Service: Thoracic;  Laterality: N/A;  . TEE WITHOUT CARDIOVERSION N/A 02/23/2016   Procedure: TRANSESOPHAGEAL ECHOCARDIOGRAM (TEE);  Surgeon: Jerline Pain, MD;  Location: [facility]  ENDOSCOPY;  Service: Cardiovascular;  Laterality: N/A;  . TUBAL LIGATION     "tied, burned, clipped"    Social History   Social History  . Marital status: Divorced    Spouse name: N/A  . Number of children: N/A  . Years of education: 9th grade   Occupational History  .  Unemployed   Social History Main Topics  . Smoking status: Current Every Day Smoker    Packs/day: 1.00    Years: 32.00    Types: Cigarettes  . Smokeless tobacco: Never Used  . Alcohol use No  . Drug use: Yes    Types: IV, Cocaine, Marijuana, Methamphetamines, Heroin, Other-see comments     Comment: Crystal meth, "molly," narcotics,   . Sexual activity: Yes    Birth control/ protection: Surgical    Allergies  Allergen Reactions  . Doxycycline Other (See Comments)    burns  . Mango Flavor Hives and Swelling  . Pyridium [Phenazopyridine Hcl] Other (See Comments)    Irritation to skin  . Tramadol Nausea And Vomiting    Current Facility-Administered Medications  Medication Dose Route Frequency Provider Last Rate Last Dose  . 0.9 % NaCl with KCl 20 mEq/ L  infusion   Intravenous Continuous Phillips Grout, MD 75 mL/hr at 08/31/16 2229    . ceFEPIme (MAXIPIME) 2 g in dextrose 5 % 50 mL IVPB  2 g Intravenous Q8H Elicia Lamp P, RPH      . ketorolac (TORADOL) 30 MG/ML injection 30 mg  30 mg Intravenous Q6H PRN Phillips Grout, MD   30 mg at 09/01/16 7124  . methadone (DOLOPHINE) tablet 5 mg  5 mg Oral Q12H Velvet Bathe, MD      . ondansetron [facility]) tablet 4 mg  4 mg Oral Q6H PRN Phillips Grout, MD       Or  . ondansetron [facility]) injection 4 mg  4 mg Intravenous Q6H PRN Phillips Grout, MD      . vancomycin (VANCOCIN) IVPB 1000 mg/200 mL premix  1,000 mg Intravenous Q12H Phillips Grout, MD   Stopped at 09/01/16 0045    No prescriptions prior to admission.    Family History  Problem Relation Age of Onset  . COPD Mother   . Hypertension Father   . Heart attack Father   . Diabetes Father   .  Arthritis Unknown   . Lung disease Unknown   . Cancer Unknown   . Asthma Unknown    Review of Systems:  Pertinent items are noted in HPI.     Physical Exam: BP 114/69 (BP Location:  Right Arm)   Pulse 60   Temp 98.1 F (36.7 C) (Oral)   Resp 18   Ht 5\' 1"  (1.549 m)   Wt 74.6 kg (164 lb 6.4 oz)   LMP 07/01/2016   SpO2 95%   BMI 31.06 kg/m    General appearance: cooperative, appears older than stated age and no distress Head: Normocephalic, without obvious abnormality, atraumatic Neck: no JVD and supple, symmetrical, trachea midline. Swelling, erythema, and tenderness of right sternoclavicular joint. No drainage. Resp: clear to auscultation bilaterally Cardio: Slightly bradycardic GI: Soft,non tender, bowel sounds present Extremities: Trace LE edema Neurologic: Grossly normal  Diagnostic Studies & Laboratory data:     Recent Radiology Findings:   Dg Chest 2 View  Result Date: 08/31/2016 CLINICAL DATA:  46 year old female with right side chest mass and pain. Septic right sternoclavicular and first costo manubrial joint in November 2017. EXAM: CHEST  2 VIEW COMPARISON:  Chest radiographs 03/18/2016 and earlier. Chest CT 02/21/2016. FINDINGS: 5 cm mass like asymmetric right peritracheal region increased density appears probably related to the previously abnormal right sternoclavicular and first rib costochondral junctions. On the lateral view no upper lung mass is evident. Mediastinal contours remain normal. No pneumothorax, pulmonary edema or confluent pulmonary opacity. Small bilateral pleural effusions have resolved since December. Negative visible bowel gas pattern. IMPRESSION: 1. Right peritracheal region 5 cm masslike asymmetric density is probably related to the previously abnormal right sternoclavicular and first rib costochondral joints. 2. Small bilateral pleural effusions have resolved since December. No acute cardiopulmonary abnormality. Electronically Signed   By: Genevie Ann  M.D.   On: 08/31/2016 15:46   Ct Chest W Contrast  Result Date: 08/31/2016 CLINICAL DATA:  Acute onset of shortness of breath. Recurrent right chest wall abscess. Initial encounter. EXAM: CT CHEST WITH CONTRAST TECHNIQUE: Multidetector CT imaging of the chest was performed during intravenous contrast administration. CONTRAST:  59mL ISOVUE-300 IOPAMIDOL (ISOVUE-300) INJECTION 61% COMPARISON:  CT of the chest performed 02/21/2016, and chest radiograph performed earlier today at 3:19 p.m. FINDINGS: Cardiovascular: The heart is normal in size. The thoracic aorta is unremarkable. The great vessels are grossly unremarkable. Mediastinum/Nodes: A large region of phlegmon and septic effusion is again noted at the right sternoclavicular and right first costomanubrial joints. There is new air within this collection, likely reflecting interval drainage of superficial abscess. The phlegmon measures 8-9 cm in size, with underlying mild bony destruction and diffuse sclerosis again noted, tracking along the right clavicle, right first rib and right side of the sternum. There is mild extension of phlegmon to the superior aspect of the mediastinum. Mild nonspecific soft tissue inflammation tracks along the anterior aspect of the mediastinum. No mediastinal lymphadenopathy is seen. No pericardial effusion is identified. The visualized portions of the thyroid gland are unremarkable. There is mild asymmetric prominence of right axillary nodes, without significant axillary lymphadenopathy. Lungs/Pleura: Mild multifocal right-sided airspace opacity may reflect atelectasis or possibly pneumonia, including mild opacity underlying the right anterior chest wall phlegmon. No pleural effusion or pneumothorax is seen. No suspicious masses are identified. Upper Abdomen: The visualized portions of the liver are unremarkable. The spleen is mildly enlarged, measuring 13.5 cm in length. The visualized portions of the pancreas, adrenal glands and  kidneys are within normal limits. Musculoskeletal: No additional osseous abnormalities are identified. The visualized musculature is unremarkable in appearance. Vague soft tissue inflammation is seen tracking about the right breast, reflecting cellulitis. IMPRESSION: 1. Large region of phlegmon and septic effusion again noted at the  right sternoclavicular and right first costomanubrial joints. New air within this collection likely reflects interval drainage of the superficial abscess. The phlegmon measures 8-9 cm, with underlying mild bony destruction and diffuse sclerosis again noted. Sclerosis involves the right clavicle, right first rib and right side of the sternum. Mild extension of phlegmon to the superior aspect of the mediastinum. 2. Mild right-sided airspace opacity may reflect atelectasis or possibly mild pneumonia, including mild opacity underlying the right anterior chest wall phlegmon. 3. Vague soft tissue inflammation tracking about the right breast, reflecting cellulitis. 4. Mild asymmetric prominence of right axillary nodes, without significant axillary lymphadenopathy. 5. Mild splenomegaly. Electronically Signed   By: Garald Balding M.D.   On: 08/31/2016 18:43     I have independently reviewed the above radiologic studies.  Recent Lab Findings: Lab Results  Component Value Date   WBC 13.3 (H) 09/01/2016   HGB 8.4 (L) 09/01/2016   HCT 26.3 (L) 09/01/2016   PLT 323 09/01/2016   GLUCOSE 111 (H) 09/01/2016   ALT 9 (L) 08/31/2016   AST 17 08/31/2016   NA 136 09/01/2016   K 3.7 09/01/2016   CL 105 09/01/2016   CREATININE 0.76 09/01/2016   BUN <5 (L) 09/01/2016   CO2 24 09/01/2016   TSH 0.943 10/01/2014   INR 1.00 03/18/2016   HGBA1C 5.8 (H) 10/01/2014    Assessment / Plan:   1.   Right sterno clavicular joint infection-s/p I and D by Dr. Roxan Hockey on 02/21/2017. Will likely need I and D in OR in am. Dr. Cyndia Bent will evaluate. 2. IVDU with a history of polysubstance  abuse-continues to use IV Heroin. Drug screen on this admission was positive for cocaine and opiates. 3. Tricuspid valve endocarditis-small vegetation without TV deterioration on TEE 02/23/2016. 4. History of Comamonas acidovorans (Delftia acidovorans) bacteremia 5. Hepatitis C-likely chronic 6. Anemia 7. ID- On Cefepime and Vancomycin. Infectious disease to evaluate. Previous MRSA  bacteremia and Corynebacterium. Blood cultures drawn yesterday show no growth to date.  I  spent 15 minutes counseling the patient face to face and 50% or more the  time was spent in counseling and coordination of care. The total time spent in the appointment was 60 minutes.    Lars Pinks PA-C 09/01/2016 10:24 AM   Medical records and CT scans reviewed. Patient interviewed and examined. Agree with above assessment and plan. Her CT shows a large phlegmon involving the area of the right sternoclavicular joint with obvious osteomyelitis of the manubrium, proximal clavicle and first rib at the costomanubrial junction. There is some fluid beneath the manubrium. On exam she has a large subcutaneous abscess of the anterior chest wall over this region. This will require surgical drainage and debridement with removal of the infected bone. This may require a muscle flap for closure at some point if it does not heal with a vac. This will be a deep wound with exposed bone. I discussed the surgical procedure with her including alternatives, benefits and risks including but not limited to bleeding, blood transfusion, persistent or recurrent infection, vascular injury, need for further debridements, poor wound healing and possible need for further surgical procedures to heal. I also discussed the risk of sepsis and organ failure related to this infection. She says that she understands and agrees to proceed. Will do in the OR tomorrow morning. She will need an echo to reassess her valves for vegetations and I would have ID see her  again.   I spent 60 minutes performing  this consultation and > 50% of this time was spent face to face counseling and coordinating the care of this patient's sternoclavicular phlegmon.  Gaye Pollack, MD 09/01/2016

## 2016-09-02 ENCOUNTER — Encounter (HOSPITAL_COMMUNITY)
Admission: EM | Discharge status: Against Medical Advice | Payer: Self-pay | Source: Home / Self Care | Attending: Family Medicine

## 2016-09-02 ENCOUNTER — Inpatient Hospital Stay (HOSPITAL_COMMUNITY): Payer: Medicare Other

## 2016-09-02 ENCOUNTER — Encounter (HOSPITAL_COMMUNITY): Payer: Self-pay | Admitting: Anesthesiology

## 2016-09-02 ENCOUNTER — Inpatient Hospital Stay (HOSPITAL_COMMUNITY): Payer: Medicare Other | Admitting: Anesthesiology

## 2016-09-02 DIAGNOSIS — M009 Pyogenic arthritis, unspecified: Secondary | ICD-10-CM | POA: Diagnosis present

## 2016-09-02 HISTORY — PX: I&D EXTREMITY: SHX5045

## 2016-09-02 SURGERY — IRRIGATION AND DEBRIDEMENT EXTREMITY
Anesthesia: General | Site: Chest | Laterality: Right

## 2016-09-02 MED ORDER — OXYCODONE HCL 5 MG PO TABS
10.0000 mg | ORAL_TABLET | ORAL | Status: DC | PRN
  Administered 2016-09-02 – 2016-09-16 (×52): 10 mg via ORAL
  Filled 2016-09-02 (×53): qty 2

## 2016-09-02 MED ORDER — SUGAMMADEX SODIUM 200 MG/2ML IV SOLN
INTRAVENOUS | Status: DC | PRN
  Administered 2016-09-02: 200 mg via INTRAVENOUS

## 2016-09-02 MED ORDER — FENTANYL CITRATE (PF) 100 MCG/2ML IJ SOLN
INTRAMUSCULAR | Status: DC | PRN
  Administered 2016-09-02: 100 ug via INTRAVENOUS
  Administered 2016-09-02 (×2): 50 ug via INTRAVENOUS
  Administered 2016-09-02: 100 ug via INTRAVENOUS
  Administered 2016-09-02 (×4): 50 ug via INTRAVENOUS

## 2016-09-02 MED ORDER — ONDANSETRON HCL 4 MG/2ML IJ SOLN: 4.0000 mg | Freq: Once | INTRAMUSCULAR | Status: DC | PRN

## 2016-09-02 MED ORDER — HYDROMORPHONE HCL 1 MG/ML IJ SOLN
INTRAMUSCULAR | Status: AC
  Filled 2016-09-02: qty 1

## 2016-09-02 MED ORDER — FENTANYL CITRATE (PF) 250 MCG/5ML IJ SOLN
INTRAMUSCULAR | Status: AC
Start: 2016-09-02 — End: 2016-09-02
  Filled 2016-09-02: qty 5

## 2016-09-02 MED ORDER — ROCURONIUM BROMIDE 100 MG/10ML IV SOLN
INTRAVENOUS | Status: DC | PRN
  Administered 2016-09-02: 50 mg via INTRAVENOUS

## 2016-09-02 MED ORDER — KETOROLAC TROMETHAMINE 15 MG/ML IJ SOLN
15.0000 mg | Freq: Four times a day (QID) | INTRAMUSCULAR | Status: AC | PRN
Start: 2016-09-02 — End: 2016-09-07
  Administered 2016-09-02 – 2016-09-05 (×5): 15 mg via INTRAVENOUS
  Filled 2016-09-02 (×5): qty 1

## 2016-09-02 MED ORDER — ZOLPIDEM TARTRATE 5 MG PO TABS
5.0000 mg | ORAL_TABLET | Freq: Every evening | ORAL | Status: DC | PRN
  Administered 2016-09-02 – 2016-09-15 (×12): 5 mg via ORAL
  Filled 2016-09-02 (×12): qty 1

## 2016-09-02 MED ORDER — HYDROMORPHONE HCL 1 MG/ML IJ SOLN
0.2500 mg | INTRAMUSCULAR | Status: DC | PRN
  Administered 2016-09-02 (×4): 0.5 mg via INTRAVENOUS

## 2016-09-02 MED ORDER — LACTATED RINGERS IV SOLN
INTRAVENOUS | Status: DC | PRN
  Administered 2016-09-02: 07:00:00 via INTRAVENOUS

## 2016-09-02 MED ORDER — ONDANSETRON HCL 4 MG/2ML IJ SOLN
INTRAMUSCULAR | Status: AC
  Filled 2016-09-02: qty 2

## 2016-09-02 MED ORDER — MIDAZOLAM HCL 2 MG/2ML IJ SOLN
INTRAMUSCULAR | Status: AC
  Filled 2016-09-02: qty 2

## 2016-09-02 MED ORDER — PROPOFOL 10 MG/ML IV BOLUS
INTRAVENOUS | Status: AC
  Filled 2016-09-02: qty 20

## 2016-09-02 MED ORDER — PROPOFOL 10 MG/ML IV BOLUS
INTRAVENOUS | Status: DC | PRN
  Administered 2016-09-02: 200 mg via INTRAVENOUS

## 2016-09-02 MED ORDER — MIDAZOLAM HCL 5 MG/5ML IJ SOLN
INTRAMUSCULAR | Status: DC | PRN
  Administered 2016-09-02: 2 mg via INTRAVENOUS

## 2016-09-02 MED ORDER — HYDROMORPHONE HCL 1 MG/ML IJ SOLN
INTRAMUSCULAR | Status: AC
Start: 2016-09-02 — End: 2016-09-02
  Filled 2016-09-02: qty 0.5

## 2016-09-02 MED ORDER — OXYCODONE HCL 5 MG PO TABS
ORAL_TABLET | ORAL | Status: AC
  Filled 2016-09-02: qty 2

## 2016-09-02 MED ORDER — HYDROMORPHONE HCL 1 MG/ML IJ SOLN: 0.5000 mg | INTRAMUSCULAR | Status: DC | PRN

## 2016-09-02 MED ORDER — HYDROMORPHONE HCL 1 MG/ML IJ SOLN
INTRAMUSCULAR | Status: DC | PRN
  Administered 2016-09-02: 0.5 mg via INTRAVENOUS

## 2016-09-02 MED ORDER — 0.9 % SODIUM CHLORIDE (POUR BTL) OPTIME
TOPICAL | Status: DC | PRN
  Administered 2016-09-02: 1000 mL

## 2016-09-02 MED ORDER — HYDROMORPHONE HCL 1 MG/ML IJ SOLN
INTRAMUSCULAR | Status: AC
  Filled 2016-09-02: qty 0.5

## 2016-09-02 MED ORDER — FENTANYL CITRATE (PF) 250 MCG/5ML IJ SOLN
INTRAMUSCULAR | Status: AC
  Filled 2016-09-02: qty 5

## 2016-09-02 MED ORDER — ONDANSETRON HCL 4 MG/2ML IJ SOLN
INTRAMUSCULAR | Status: DC | PRN
  Administered 2016-09-02: 4 mg via INTRAVENOUS

## 2016-09-02 MED ORDER — SUGAMMADEX SODIUM 200 MG/2ML IV SOLN
INTRAVENOUS | Status: AC
  Filled 2016-09-02: qty 2

## 2016-09-02 SURGICAL SUPPLY — 35 items
BANDAGE ACE 4X5 VEL STRL LF (GAUZE/BANDAGES/DRESSINGS) IMPLANT
BANDAGE ACE 6X5 VEL STRL LF (GAUZE/BANDAGES/DRESSINGS) IMPLANT
BNDG GAUZE ELAST 4 BULKY (GAUZE/BANDAGES/DRESSINGS) IMPLANT
CANISTER SUCT 3000ML PPV (MISCELLANEOUS) ×2 IMPLANT
COVER SURGICAL LIGHT HANDLE (MISCELLANEOUS) ×4 IMPLANT
DRAPE CHEST BREAST 15X10 FENES (DRAPES) ×2 IMPLANT
DRSG VAC ATS LRG SENSATRAC (GAUZE/BANDAGES/DRESSINGS) IMPLANT
DRSG VAC ATS MED SENSATRAC (GAUZE/BANDAGES/DRESSINGS) IMPLANT
DRSG VAC ATS SM SENSATRAC (GAUZE/BANDAGES/DRESSINGS) ×2 IMPLANT
ELECT REM PT RETURN 9FT ADLT (ELECTROSURGICAL) ×2
ELECTRODE REM PT RTRN 9FT ADLT (ELECTROSURGICAL) ×1 IMPLANT
GAUZE SPONGE 4X4 12PLY STRL (GAUZE/BANDAGES/DRESSINGS) IMPLANT
GLOVE BIOGEL PI IND STRL 6 (GLOVE) ×2 IMPLANT
GLOVE BIOGEL PI IND STRL 7.5 (GLOVE) ×1 IMPLANT
GLOVE BIOGEL PI INDICATOR 6 (GLOVE) ×2
GLOVE BIOGEL PI INDICATOR 7.5 (GLOVE) ×1
GLOVE EUDERMIC 7 POWDERFREE (GLOVE) ×2 IMPLANT
GOWN STRL REUS W/ TWL LRG LVL3 (GOWN DISPOSABLE) ×2 IMPLANT
GOWN STRL REUS W/ TWL XL LVL3 (GOWN DISPOSABLE) ×1 IMPLANT
GOWN STRL REUS W/TWL LRG LVL3 (GOWN DISPOSABLE) ×2
GOWN STRL REUS W/TWL XL LVL3 (GOWN DISPOSABLE) ×1
KIT BASIN OR (CUSTOM PROCEDURE TRAY) ×2 IMPLANT
KIT ROOM TURNOVER OR (KITS) ×2 IMPLANT
NS IRRIG 1000ML POUR BTL (IV SOLUTION) ×2 IMPLANT
PACK GENERAL/GYN (CUSTOM PROCEDURE TRAY) ×2 IMPLANT
PACK UNIVERSAL I (CUSTOM PROCEDURE TRAY) IMPLANT
PAD ARMBOARD 7.5X6 YLW CONV (MISCELLANEOUS) ×4 IMPLANT
SUT VIC AB 3-0 SH 27 (SUTURE)
SUT VIC AB 3-0 SH 27X BRD (SUTURE) IMPLANT
SWAB COLLECTION DEVICE MRSA (MISCELLANEOUS) ×2 IMPLANT
TOWEL OR 17X24 6PK STRL BLUE (TOWEL DISPOSABLE) ×2 IMPLANT
TOWEL OR 17X26 10 PK STRL BLUE (TOWEL DISPOSABLE) ×2 IMPLANT
TUBE ANAEROBIC SPECIMEN COL (MISCELLANEOUS) ×2 IMPLANT
WATER STERILE IRR 1000ML POUR (IV SOLUTION) ×2 IMPLANT
WND VAC CANISTER 500ML (MISCELLANEOUS) ×2 IMPLANT

## 2016-09-02 NOTE — Progress Notes (Signed)
  Echocardiogram Echocardiogram Transesophageal has been performed.  Aydenn Gervin T Ovadia Lopp 09/02/2016, 9:20 AM

## 2016-09-02 NOTE — Anesthesia Procedure Notes (Signed)
Procedure Name: Intubation Date/Time: 09/02/2016 8:07 AM Performed by: Kyung Rudd Pre-anesthesia Checklist: Patient identified, Emergency Drugs available, Suction available and Patient being monitored Patient Re-evaluated:Patient Re-evaluated prior to inductionOxygen Delivery Method: Circle system utilized Preoxygenation: Pre-oxygenation with 100% oxygen Intubation Type: IV induction Ventilation: Mask ventilation without difficulty Laryngoscope Size: Mac and 4 Grade View: Grade I Tube type: Oral Tube size: 7.0 mm Number of attempts: 1 Airway Equipment and Method: Stylet Placement Confirmation: ETT inserted through vocal cords under direct vision,  positive ETCO2 and breath sounds checked- equal and bilateral Secured at: 20 cm Tube secured with: Tape Dental Injury: Teeth and Oropharynx as per pre-operative assessment

## 2016-09-02 NOTE — Care Management Note (Signed)
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   Expected Discharge Date:                  Expected Discharge Plan:     In-House Referral:  Clinical Social Work  Discharge planning Services  CM Consult  Post Acute Care Choice:    Choice offered to:     DME Arranged:    DME Agency:     HH Arranged:    HH Agency:     Status of Service:  In process, will continue to follow  If discussed at Long Length of Stay Meetings, dates discussed:    Additional Comments:  Dawayne Patricia, RN 09/02/2016, 1:22 PM

## 2016-09-02 NOTE — Anesthesia Postprocedure Evaluation (Addendum)
Anesthesia Post Note  Patient: [name]  Procedure(s) Performed: Procedure(s) (LRB): IRRIGATION AND DEBRIDEMENT OF RIGHT STERNOCLAVICULAR JOINT, STERNUM AND RIBS (Right)  Patient location during evaluation: PACU Anesthesia Type: General Level of consciousness: awake, awake and alert and oriented Pain management: pain level controlled Vital Signs Assessment: post-procedure vital signs reviewed and stable Respiratory status: spontaneous breathing, nonlabored ventilation and respiratory function stable Cardiovascular status: blood pressure returned to baseline Anesthetic complications: no       Last Vitals:  Vitals:   09/02/16 1100 09/02/16 1352  BP:  117/72  Pulse: 61 65  Resp: 18 18  Temp: 36.7 C 36.6 C    Last Pain:  Vitals:   09/02/16 1352  TempSrc: Oral  PainSc:                  Rolin Schult COKER

## 2016-09-02 NOTE — Transfer of Care (Signed)
Immediate Anesthesia Transfer of Care Note  Patient: [name]  Procedure(s) Performed: Procedure(s): IRRIGATION AND DEBRIDEMENT OF RIGHT STERNOCLAVICULAR JOINT, STERNUM AND RIBS (Right)  Patient Location: PACU  Anesthesia Type:General  Level of Consciousness: awake, alert  and oriented  Airway & Oxygen Therapy: Patient Spontanous Breathing and Patient connected to face mask oxygen  Post-op Assessment: Report given to RN, Post -op Vital signs reviewed and stable and Patient moving all extremities X 4  Post vital signs: Reviewed and stable  Last Vitals:  Vitals:   09/02/16 0532 09/02/16 0950  BP: (!) 145/72 91/75  Pulse: 60 73  Resp: 18 19  Temp: 36.8 C 36.4 C    Last Pain:  Vitals:   09/02/16 0950  TempSrc:   PainSc: 10-Worst pain ever         Complications: No apparent anesthesia complications

## 2016-09-02 NOTE — Brief Op Note (Signed)
08/31/2016 - 09/02/2016  9:36 AM  PATIENT:  [name]  46 y.o. female  PRE-OPERATIVE DIAGNOSIS:  Right sterno clavicular joint abscess  POST-OPERATIVE DIAGNOSIS:  Right sterno clavicular joint abscess  PROCEDURE:  Procedure(s): IRRIGATION AND DEBRIDEMENT OF RIGHT STERNOCLAVICULAR JOINT, Manubrium and first rib  SURGEON:  Surgeon(s) and Role:    * Bartle, Fernande Boyden, MD - Primary  PHYSICIAN ASSISTANT: none   ANESTHESIA:   general  EBL:  Total I/O In: -  Out: 150 [Blood:150]  BLOOD ADMINISTERED:none  DRAINS: wound vac   LOCAL MEDICATIONS USED:  NONE  SPECIMEN:  Source of Specimen:  pus and tissue from wound for routine, anaerobic and fungus  DISPOSITION OF SPECIMEN:  micro  COUNTS:  YES  TOURNIQUET:  * No tourniquets in log *  DICTATION: .Note written in EPIC  PLAN OF CARE: Admit to inpatient   PATIENT DISPOSITION:  PACU - hemodynamically stable.   Delay start of Pharmacological VTE agent (>24hrs) due to surgical blood loss or risk of bleeding: yes

## 2016-09-02 NOTE — Anesthesia Preprocedure Evaluation (Addendum)
Anesthesia Evaluation  Patient identified by MRN, date of birth, ID band Patient awake    Reviewed: Allergy & Precautions, NPO status , Patient's Chart, lab work & pertinent test results  Airway Mallampati: II  TM Distance: >3 FB Neck ROM: Full    Dental  (+) Teeth Intact, Poor Dentition, Dental Advisory Given   Pulmonary Current Smoker,    breath sounds clear to auscultation       Cardiovascular  Rhythm:Regular Rate:Normal     Neuro/Psych    GI/Hepatic   Endo/Other    Renal/GU      Musculoskeletal   Abdominal   Peds  Hematology   Anesthesia Other Findings   Reproductive/Obstetrics                            Anesthesia Physical Anesthesia Plan  ASA: III  Anesthesia Plan: General   Post-op Pain Management:    Induction: Intravenous  Airway Management Planned: Oral ETT  Additional Equipment: Arterial line, 3D TEE and CVP  Intra-op Plan:   Post-operative Plan: Extubation in OR  Informed Consent: I have reviewed the patients History and Physical, chart, labs and discussed the procedure including the risks, benefits and alternatives for the proposed anesthesia with the patient or authorized representative who has indicated his/her understanding and acceptance.   Dental advisory given  Plan Discussed with: CRNA and Anesthesiologist  Anesthesia Plan Comments:        Anesthesia Quick Evaluation

## 2016-09-02 NOTE — Anesthesia Procedure Notes (Signed)
Central Venous Catheter Insertion Performed by: Roberts Gaudy, anesthesiologist Start/End5/24/2018 7:14 AM, 09/02/2016 7:19 AM Patient location: Pre-op. Preanesthetic checklist: patient identified, IV checked, site marked, risks and benefits discussed, surgical consent, monitors and equipment checked, pre-op evaluation and timeout performed Position: supine Lidocaine 1% used for infiltration Catheter size: 8 Fr Central line was placed.Double lumen Procedure performed using ultrasound guided technique. Ultrasound Notes:anatomy identified and image(s) printed for medical record Attempts: 2 Following insertion, line sutured, dressing applied and Biopatch. Post procedure assessment: blood return through all ports, free fluid flow and no air  Patient tolerated the procedure well with no immediate complications.

## 2016-09-02 NOTE — Op Note (Signed)
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  Incision and debridement of right sternoclavicular joint, sternal head of clavicle, head of first rib and first costoclavicular joint, application of wound vac.   Anesthesia:  General Endotracheal   Clinical History/Surgical Indication:  This is a 46 year old Caucasian female known to [facility] from previous I and D of right sternoclavicular abscess 02/21/2017 and dressing change with wound VAC on  11/13 /2018. She was discharged to [facility] on 02/28/2016 on Cefepime for Comamonas acidovorans (Delftia acidovorans)bacteremia. She then left AMA from [facility] and remained non compliant.   She was seen in the ER in December 2017 for right sided pain and redness over SCV joint. and still had a PICC line and dirty dressing on her right upper arm. The wound VAC apparently had been removed prior to her leaving [facility]. PICC line was removed in the ER. She was put on IV Vancomycin and Cefepime. A new PICC line was not placed as patient high risk for infection and non compliant. Infectious disease and wound care were was consulted. Blood cultures showed MRSA  bacteremia and Corynebacterium. Per infectious disease, antibiotics were changed to oral Doxycycline and Levaquin at discharge. It was recommended that she take these for at least 2 months. She was discharged with home health on 03/25/2017. Again, she was non compliant and did not follow up appropriately.   She presented to [facility] ED yesterday with complaints of redness, swelling, and tenderness of the right SCV joint. She did state she has had chills and decreased appetite of late. She also  does admit to active abusing IV heroin. She was transferred to [facility] and admitted by medicine.   Her CT shows a large phlegmon involving the area of the right sternoclavicular joint with obvious osteomyelitis of the manubrium, proximal clavicle and first rib at the costomanubrial junction. There is some fluid beneath the manubrium. On exam she has a large subcutaneous abscess of the anterior chest wall over this region. This will require surgical drainage and debridement with removal of the infected bone. This may require a muscle flap for closure at some point if it does not heal with a vac. This will be a deep wound with exposed bone. I discussed the surgical procedure with her including alternatives, benefits and risks including but not limited to bleeding, blood transfusion, persistent or recurrent infection, vascular injury, need for further debridements, poor wound healing and possible need for further surgical procedures to heal. I also discussed the risk of sepsis and organ failure related to this infection. She says that she understands and agrees to proceed. Will do in the OR tomorrow morning.     Preparation:  The patient was seen in the preoperative holding area and the correct patient, correct operation were confirmed with the patient after reviewing the medical record and catheterization. The consent was signed by me. Her morning dose of antibiotics were already given. A central line was placed by the anesthesia team. The patient was taken back to the operating room and positioned supine on the operating room table. After being placed under general endotracheal anesthesia by the anesthesia team a foley catheter was placed. The neck and chest were prepped with betadine soap and solution and draped in the usual sterile manner. A surgical  time-out was taken and the correct patient and operative procedure were confirmed with the nursing and anesthesia staff.  TEE: performed by Dr. Roberts Gaudy. This did not show any sign of valvular vegetations.   Debridement of abscess:  An oblique incision was made over the right sternoclavicular joint through the area of fluctuance. There was immediate drainage of white pus which was cultured for routine, anaerobic and fungus. The wound was opened down to the sternoclavicular joint and it was obvious that the infection was emanating from there. The joint was completely debrided including the head of the clavicle and the medial manubrium back to bleeding, hard and viable-appearing bone. The synovium was completely removed leaving only a thin layer of posterior joint capsule to prevent injury to the subclavian vein. The first costo-sternal joint was involved and was completely debrided including the sternal head of the first rib as far back as I could see it. Hemostasis was achieved. A small wound vac sponge was placed in the wound and the occlusive dressing and wound vac applied.   Completion:   All sponge, needle, and instrument counts were reported correct at the end of the case. The patient was awakened and extubated. The patient was then transported to the PACU in good condition.

## 2016-09-02 NOTE — Progress Notes (Signed)
PROGRESS NOTE    FUMIE FIALLO  WYO:[id number] DOB: [birth year] DOA: 08/31/2016 PCP: Patient, [name]    Brief Narrative:  46 y.o. female with medical history significant of IVDA, septic joint, septic emboli, endocardititis with recent long hospitalization in dec of 2017 for absess to chest wall with septic joint and infected valve comes in with 3 days of developing mass/absess to the same area to right chest wall with redness that has spread down to right breast with associated pain with breathing and moving right shoulder.    Assessment & Plan:   Principal Problem:   Septic arthritis of right sternoclavicular joint (HCC)/ Abscess of chest (HCC) - Given pain and history of Heroin use added methadone to try and control discomfort from active infection site. - continue current IV antibiotics - Cardiothoracic surgeon consulted and pt is s/p: Incision and debridement of right sternoclavicular joint, sternal head of clavicle, head of first rib and first costoclavicular joint, application of wound vac.  Active Problems:   Polysubstance abuse - Stable currently    IV drug abuse   Hepatitis C infection    COPD (chronic obstructive pulmonary disease) (HCC) - stable currently.    Endocarditis of tricuspid valve   DVT prophylaxis: SCD Code Status: Full Family Communication: none at bedside Disposition Plan: pending improvement in condition   Consultants:   Cardiothoracic surgeon   Procedures: None   Antimicrobials: Cefepime, vancomycin   Subjective: Pt has no new complaints currently eating.  Objective: Vitals:   09/02/16 1030 09/02/16 1045 09/02/16 1100 09/02/16 1352  BP:    117/72  Pulse: 61 (!) 58 61 65  Resp: 18 16 18 18   Temp:   98 F (36.7 C) 97.8 F (36.6 C)  TempSrc:    Oral  SpO2: 96% 99% 96% 97%  Weight:      Height:        Intake/Output Summary (Last 24 hours) at 09/02/16 1635 Last data filed at 09/02/16 1000  Gross per 24 hour  Intake              1260 ml  Output              600 ml  Net              660 ml   Filed Weights   08/31/16 1213 08/31/16 2222  Weight: 72.6 kg (160 lb) 74.6 kg (164 lb 6.4 oz)    Examination:  General exam: uncomfortably but in NAD Respiratory system: Clear to auscultation. Respiratory effort normal. Cardiovascular system: S1 & S2 heard, RRR. No JVD, murmurs, rubs, gallops or clicks. No pedal edema. Gastrointestinal system: Abdomen is nondistended, soft and nontender. No organomegaly or masses felt. Normal bowel sounds heard. Central nervous system: Alert and oriented. No focal neurological deficits. Extremities: Symmetric 5 x 5 power. Skin: abscess chest, cellulitis covered and wound vac in place at chest Psychiatry:Mood & affect appropriate.     Data Reviewed: I have personally reviewed following labs and imaging studies  CBC:  Recent Labs Lab 08/31/16 1438 09/01/16 0230  WBC 13.4* 13.3*  NEUTROABS 8.0*  --   HGB 8.3* 8.4*  HCT 25.4* 26.3*  MCV 75.4* 74.9*  PLT 287 774   Basic Metabolic Panel:  Recent Labs Lab 08/31/16 1438 09/01/16 0230  NA 132* 136  K 3.3* 3.7  CL 98* 105  CO2 25 24  GLUCOSE 139* 111*  BUN 7 <5*  CREATININE 0.72 0.76  CALCIUM 8.1* 8.2*  GFR: Estimated Creatinine Clearance: 82.9 mL/min (by C-G formula based on SCr of 0.76 mg/dL). Liver Function Tests:  Recent Labs Lab 08/31/16 1438  AST 17  ALT 9*  ALKPHOS 51  BILITOT 0.5  PROT 7.4  ALBUMIN 2.5*   No results for input(s): LIPASE, AMYLASE in the last 168 hours. No results for input(s): AMMONIA in the last 168 hours. Coagulation Profile: No results for input(s): INR, PROTIME in the last 168 hours. Cardiac Enzymes: No results for input(s): CKTOTAL, CKMB, CKMBINDEX, TROPONINI in the last 168 hours. BNP (last 3 results) No results for input(s): PROBNP in the last 8760 hours. HbA1C: No results for input(s): HGBA1C in the last 72 hours. CBG: No results for input(s): GLUCAP in the last 168  hours. Lipid Profile: No results for input(s): CHOL, HDL, LDLCALC, TRIG, CHOLHDL, LDLDIRECT in the last 72 hours. Thyroid Function Tests: No results for input(s): TSH, T4TOTAL, FREET4, T3FREE, THYROIDAB in the last 72 hours. Anemia Panel: No results for input(s): VITAMINB12, FOLATE, FERRITIN, TIBC, IRON, RETICCTPCT in the last 72 hours. Sepsis Labs:  Recent Labs Lab 08/31/16 1457  LATICACIDVEN 1.87    Recent Results (from the past 240 hour(s))  Culture, blood (Routine X 2) w Reflex to ID Panel     Status: None (Preliminary result)   Collection Time: 08/31/16  2:55 PM  Result Value Ref Range Status   Specimen Description LEFT ANTECUBITAL  Final   Special Requests   Final    BOTTLES DRAWN AEROBIC AND ANAEROBIC Blood Culture adequate volume   Culture NO GROWTH 2 DAYS  Final   Report Status PENDING  Incomplete  Culture, blood (Routine X 2) w Reflex to ID Panel     Status: None (Preliminary result)   Collection Time: 08/31/16  3:04 PM  Result Value Ref Range Status   Specimen Description RIGHT ANTECUBITAL  Final   Special Requests   Final    BOTTLES DRAWN AEROBIC AND ANAEROBIC Blood Culture adequate volume   Culture NO GROWTH 2 DAYS  Final   Report Status PENDING  Incomplete  MRSA PCR Screening     Status: None   Collection Time: 08/31/16 10:26 PM  Result Value Ref Range Status   MRSA by PCR NEGATIVE NEGATIVE Final    Comment:        The GeneXpert MRSA Assay (FDA approved for NASAL specimens only), is one component of a comprehensive MRSA colonization surveillance program. It is not intended to diagnose MRSA infection nor to guide or monitor treatment for MRSA infections.   Aerobic/Anaerobic Culture (surgical/deep wound)     Status: None (Preliminary result)   Collection Time: 09/02/16  8:31 AM  Result Value Ref Range Status   Specimen Description ABSCESS  Final   Special Requests RIGHT STERNOCLAVICULAR POF VANC AND CEFEPINE  Final   Gram Stain   Final    FEW WBC  PRESENT, PREDOMINANTLY PMN FEW GRAM POSITIVE COCCI RARE GRAM VARIABLE ROD    Culture PENDING  Incomplete   Report Status PENDING  Incomplete  Aerobic/Anaerobic Culture (surgical/deep wound)     Status: None (Preliminary result)   Collection Time: 09/02/16  8:46 AM  Result Value Ref Range Status   Specimen Description TISSUE  Final   Special Requests RIGHT STERNOCLAVICULAR TIS POF VANC AND CEFEPINE  Final   Gram Stain   Final    FEW WBC PRESENT, PREDOMINANTLY PMN NO ORGANISMS SEEN    Culture PENDING  Incomplete   Report Status PENDING  Incomplete  Radiology Studies: Ct Chest W Contrast  Result Date: 08/31/2016 CLINICAL DATA:  Acute onset of shortness of breath. Recurrent right chest wall abscess. Initial encounter. EXAM: CT CHEST WITH CONTRAST TECHNIQUE: Multidetector CT imaging of the chest was performed during intravenous contrast administration. CONTRAST:  8mL ISOVUE-300 IOPAMIDOL (ISOVUE-300) INJECTION 61% COMPARISON:  CT of the chest performed 02/21/2016, and chest radiograph performed earlier today at 3:19 p.m. FINDINGS: Cardiovascular: The heart is normal in size. The thoracic aorta is unremarkable. The great vessels are grossly unremarkable. Mediastinum/Nodes: A large region of phlegmon and septic effusion is again noted at the right sternoclavicular and right first costomanubrial joints. There is new air within this collection, likely reflecting interval drainage of superficial abscess. The phlegmon measures 8-9 cm in size, with underlying mild bony destruction and diffuse sclerosis again noted, tracking along the right clavicle, right first rib and right side of the sternum. There is mild extension of phlegmon to the superior aspect of the mediastinum. Mild nonspecific soft tissue inflammation tracks along the anterior aspect of the mediastinum. No mediastinal lymphadenopathy is seen. No pericardial effusion is identified. The visualized portions of the thyroid gland are  unremarkable. There is mild asymmetric prominence of right axillary nodes, without significant axillary lymphadenopathy. Lungs/Pleura: Mild multifocal right-sided airspace opacity may reflect atelectasis or possibly pneumonia, including mild opacity underlying the right anterior chest wall phlegmon. No pleural effusion or pneumothorax is seen. No suspicious masses are identified. Upper Abdomen: The visualized portions of the liver are unremarkable. The spleen is mildly enlarged, measuring 13.5 cm in length. The visualized portions of the pancreas, adrenal glands and kidneys are within normal limits. Musculoskeletal: No additional osseous abnormalities are identified. The visualized musculature is unremarkable in appearance. Vague soft tissue inflammation is seen tracking about the right breast, reflecting cellulitis. IMPRESSION: 1. Large region of phlegmon and septic effusion again noted at the right sternoclavicular and right first costomanubrial joints. New air within this collection likely reflects interval drainage of the superficial abscess. The phlegmon measures 8-9 cm, with underlying mild bony destruction and diffuse sclerosis again noted. Sclerosis involves the right clavicle, right first rib and right side of the sternum. Mild extension of phlegmon to the superior aspect of the mediastinum. 2. Mild right-sided airspace opacity may reflect atelectasis or possibly mild pneumonia, including mild opacity underlying the right anterior chest wall phlegmon. 3. Vague soft tissue inflammation tracking about the right breast, reflecting cellulitis. 4. Mild asymmetric prominence of right axillary nodes, without significant axillary lymphadenopathy. 5. Mild splenomegaly. Electronically Signed   By: Garald Balding M.D.   On: 08/31/2016 18:43   Dg Chest Port 1 View  Result Date: 09/02/2016 CLINICAL DATA:  Central line placement EXAM: PORTABLE CHEST 1 VIEW COMPARISON:  08/31/2016 FINDINGS: Left central line tip is in  the SVC. No pneumothorax. Heart and mediastinal contours are within normal limits. No focal opacities or effusions. No acute bony abnormality. IMPRESSION: Left central line tip in the SVC. No active cardiopulmonary disease. Electronically Signed   By: Rolm Baptise M.D.   On: 09/02/2016 10:48    Scheduled Meds: . HYDROmorphone      . HYDROmorphone      . HYDROmorphone      . methadone  5 mg Oral Q12H  . oxyCODONE       Continuous Infusions: . ceFEPime (MAXIPIME) IV 2 g (09/02/16 1500)  . vancomycin 1,000 mg (09/02/16 1352)     LOS: 2 days    Time spent: > 35 minutes  Jaleil Renwick,  Linward Foster, MD [facility] Pager [phone]  If 7PM-7AM, please contact night-coverage [email or web address] Password [facility] 09/02/2016, 4:35 PM

## 2016-09-03 ENCOUNTER — Other Ambulatory Visit (HOSPITAL_COMMUNITY): Payer: Self-pay

## 2016-09-03 ENCOUNTER — Encounter (HOSPITAL_COMMUNITY): Payer: Self-pay | Admitting: Surgery

## 2016-09-03 LAB — CBC
HCT: 26.5 % — ABNORMAL LOW (ref 36.0–46.0)
Hemoglobin: 8.4 g/dL — ABNORMAL LOW (ref 12.0–15.0)
MCH: 23.8 pg — ABNORMAL LOW (ref 26.0–34.0)
MCHC: 31.7 g/dL (ref 30.0–36.0)
MCV: 75.1 fL — ABNORMAL LOW (ref 78.0–100.0)
Platelets: 457 10*3/uL — ABNORMAL HIGH (ref 150–400)
RBC: 3.53 MIL/uL — ABNORMAL LOW (ref 3.87–5.11)
RDW: 17.6 % — ABNORMAL HIGH (ref 11.5–15.5)
WBC: 10.2 10*3/uL (ref 4.0–10.5)

## 2016-09-03 NOTE — Progress Notes (Signed)
PROGRESS NOTE    [name]  QVZ:[id number] DOB: [birth year] DOA: 08/31/2016 PCP: Patient, [name]    Brief Narrative:  46 y.o. female with medical history significant of IVDA, septic joint, septic emboli, endocardititis with recent long hospitalization in dec of 2017 for absess to chest wall with septic joint and infected valve comes in with 3 days of developing mass/absess to the same area to right chest wall with redness that has spread down to right breast with associated pain with breathing and moving right shoulder.    Assessment & Plan:   Principal Problem:   Septic arthritis of right sternoclavicular joint (HCC)/ Abscess of chest (HCC) - Given pain and history of Heroin use added methadone to try and control discomfort from active infection site. - continue current IV antibiotics, will consult ID next am for recommendations with antibiotic regimen moving forward - Cardiothoracic surgeon consulted and pt is s/p: Incision and debridement of right sternoclavicular joint, sternal head of clavicle, head of first rib and first costoclavicular joint, application of wound vac.  Active Problems:   Polysubstance abuse - Stable currently    IV drug abuse   Hepatitis C infection    COPD (chronic obstructive pulmonary disease) (HCC) - stable currently.    Endocarditis of tricuspid valve   DVT prophylaxis: SCD Code Status: Full Family Communication: none at bedside Disposition Plan: discussed with treatment team. Patient may represent difficult placement secondary to her history and her needs.   Consultants:   Cardiothoracic surgeon   Procedures: None   Antimicrobials: Cefepime, vancomycin   Subjective: No new problems reported  Objective: Vitals:   09/02/16 1352 09/02/16 1956 09/03/16 0702 09/03/16 1443  BP: 117/72 113/70 132/74 135/72  Pulse: 65 70 (!) 55 60  Resp: 18 18 18 18   Temp: 97.8 F (36.6 C) 98.2 F (36.8 C) 98.1 F (36.7 C)   TempSrc: Oral Oral  Oral   SpO2: 97% 98% 99% 100%  Weight:      Height:        Intake/Output Summary (Last 24 hours) at 09/03/16 1600 Last data filed at 09/03/16 0702  Gross per 24 hour  Intake                0 ml  Output             1000 ml  Net            -1000 ml   Filed Weights   08/31/16 1213 08/31/16 2222  Weight: 72.6 kg (160 lb) 74.6 kg (164 lb 6.4 oz)    Examination:  General exam: uncomfortably but in NAD Respiratory system: Clear to auscultation. Respiratory effort normal. Cardiovascular system: S1 & S2 heard, RRR. No JVD, murmurs, rubs, gallops or clicks. No pedal edema. Gastrointestinal system: Abdomen is nondistended, soft and nontender. No organomegaly or masses felt. Normal bowel sounds heard. Central nervous system: Alert and oriented. No focal neurological deficits. Extremities: Symmetric 5 x 5 power. Skin:  wound vac in place at chest Psychiatry:Mood & affect appropriate.     Data Reviewed: I have personally reviewed following labs and imaging studies  CBC:  Recent Labs Lab 08/31/16 1438 09/01/16 0230 09/03/16 0216  WBC 13.4* 13.3* 10.2  NEUTROABS 8.0*  --   --   HGB 8.3* 8.4* 8.4*  HCT 25.4* 26.3* 26.5*  MCV 75.4* 74.9* 75.1*  PLT 287 323 329*   Basic Metabolic Panel:  Recent Labs Lab 08/31/16 1438 09/01/16 0230  NA 132*  136  K 3.3* 3.7  CL 98* 105  CO2 25 24  GLUCOSE 139* 111*  BUN 7 <5*  CREATININE 0.72 0.76  CALCIUM 8.1* 8.2*   GFR: Estimated Creatinine Clearance: 82.9 mL/min (by C-G formula based on SCr of 0.76 mg/dL). Liver Function Tests:  Recent Labs Lab 08/31/16 1438  AST 17  ALT 9*  ALKPHOS 51  BILITOT 0.5  PROT 7.4  ALBUMIN 2.5*   No results for input(s): LIPASE, AMYLASE in the last 168 hours. No results for input(s): AMMONIA in the last 168 hours. Coagulation Profile: No results for input(s): INR, PROTIME in the last 168 hours. Cardiac Enzymes: No results for input(s): CKTOTAL, CKMB, CKMBINDEX, TROPONINI in the last 168  hours. BNP (last 3 results) No results for input(s): PROBNP in the last 8760 hours. HbA1C: No results for input(s): HGBA1C in the last 72 hours. CBG: No results for input(s): GLUCAP in the last 168 hours. Lipid Profile: No results for input(s): CHOL, HDL, LDLCALC, TRIG, CHOLHDL, LDLDIRECT in the last 72 hours. Thyroid Function Tests: No results for input(s): TSH, T4TOTAL, FREET4, T3FREE, THYROIDAB in the last 72 hours. Anemia Panel: No results for input(s): VITAMINB12, FOLATE, FERRITIN, TIBC, IRON, RETICCTPCT in the last 72 hours. Sepsis Labs:  Recent Labs Lab 08/31/16 1457  LATICACIDVEN 1.87    Recent Results (from the past 240 hour(s))  Culture, blood (Routine X 2) w Reflex to ID Panel     Status: None (Preliminary result)   Collection Time: 08/31/16  2:55 PM  Result Value Ref Range Status   Specimen Description LEFT ANTECUBITAL  Final   Special Requests   Final    BOTTLES DRAWN AEROBIC AND ANAEROBIC Blood Culture adequate volume   Culture NO GROWTH 3 DAYS  Final   Report Status PENDING  Incomplete  Culture, blood (Routine X 2) w Reflex to ID Panel     Status: None (Preliminary result)   Collection Time: 08/31/16  3:04 PM  Result Value Ref Range Status   Specimen Description RIGHT ANTECUBITAL  Final   Special Requests   Final    BOTTLES DRAWN AEROBIC AND ANAEROBIC Blood Culture adequate volume   Culture NO GROWTH 3 DAYS  Final   Report Status PENDING  Incomplete  MRSA PCR Screening     Status: None   Collection Time: 08/31/16 10:26 PM  Result Value Ref Range Status   MRSA by PCR NEGATIVE NEGATIVE Final    Comment:        The GeneXpert MRSA Assay (FDA approved for NASAL specimens only), is one component of a comprehensive MRSA colonization surveillance program. It is not intended to diagnose MRSA infection nor to guide or monitor treatment for MRSA infections.   Aerobic/Anaerobic Culture (surgical/deep wound)     Status: None (Preliminary result)   Collection  Time: 09/02/16  8:31 AM  Result Value Ref Range Status   Specimen Description ABSCESS  Final   Special Requests RIGHT STERNOCLAVICULAR POF VANC AND CEFEPINE  Final   Gram Stain   Final    FEW WBC PRESENT, PREDOMINANTLY PMN FEW GRAM POSITIVE COCCI RARE GRAM VARIABLE ROD    Culture   Final    MODERATE STAPHYLOCOCCUS AUREUS SUSCEPTIBILITIES TO FOLLOW    Report Status PENDING  Incomplete  Aerobic/Anaerobic Culture (surgical/deep wound)     Status: None (Preliminary result)   Collection Time: 09/02/16  8:46 AM  Result Value Ref Range Status   Specimen Description TISSUE  Final   Special Requests RIGHT STERNOCLAVICULAR TIS  POF VANC AND CEFEPINE  Final   Gram Stain   Final    FEW WBC PRESENT, PREDOMINANTLY PMN NO ORGANISMS SEEN    Culture   Final    FEW STAPHYLOCOCCUS AUREUS CALLED F. PASEDA RN, AT [phone] 09/03/16 BY D.VANHOOK REGARDING CULTURE GROWTH SUSCEPTIBILITIES TO FOLLOW    Report Status PENDING  Incomplete     Radiology Studies: Dg Chest Port 1 View  Result Date: 09/02/2016 CLINICAL DATA:  Central line placement EXAM: PORTABLE CHEST 1 VIEW COMPARISON:  08/31/2016 FINDINGS: Left central line tip is in the SVC. No pneumothorax. Heart and mediastinal contours are within normal limits. No focal opacities or effusions. No acute bony abnormality. IMPRESSION: Left central line tip in the SVC. No active cardiopulmonary disease. Electronically Signed   By: Rolm Baptise M.D.   On: 09/02/2016 10:48    Scheduled Meds: . methadone  5 mg Oral Q12H   Continuous Infusions: . ceFEPime (MAXIPIME) IV Stopped (09/03/16 1331)  . vancomycin 1,000 mg (09/03/16 1352)     LOS: 3 days    Time spent: > 35 minutes  Velvet Bathe, MD [facility] Pager [phone]  If 7PM-7AM, please contact night-coverage [email or web address] Password [email or web address] 09/03/2016, 4:00 PM

## 2016-09-03 NOTE — Care Management Note (Signed)
Case Management Note Marvetta Gibbons RN, BSN Unit 2W-Case Manager [phone]  Patient Details  Name: [name] MRN: [id number] Date of Birth: 1970/09/03  Subjective/Objective:  Pt admitted  Septic arthritis, sternoclavicular joint s/p I&D on 5/24- with wound VAC placed-hx of IVDU                Action/Plan: PTA pt lived at home- on last admit was set up with [facility]- pt has also been sent to SNF in past for IV abx needs- If pt is to need long term iv abx again- will need to look at SNF option- with IVDU hx not a candidate for PICC at home- will also need wound VAC for d/c. CM and CSW to follow for d/c needs  Expected Discharge Date:                  Expected Discharge Plan:  Skilled Nursing Facility  In-House Referral:  Clinical Social Work  Discharge planning Services  CM Consult  Post Acute Care Choice:    Choice offered to:     DME Arranged:    DME Agency:     HH Arranged:    [place] Agency:     Status of Service:  In process, will continue to follow  If discussed at Long Length of Stay Meetings, dates discussed:    Additional Comments:  Dawayne Patricia, RN 09/03/2016, 4:19 PM

## 2016-09-03 NOTE — Consult Note (Addendum)
[place] Nurse wound consult note Reason for Consult: Consult requested for right chest Vac dressing change. Dr Cyndia Bent and Junie Panning, PA at bedside to assess wound appearance. Wound type: Full thickness post-op wound Measurement: 6X3X3.5cm Wound bed: Beefy red with exposed bone Drainage (amount, consistency, odor) Small amt bloody drainage in the cannister, no odor Periwound: Intact skin surrounding Dressing procedure/placement/frequency: Applied 2 pieces black foam to cont suction at 187mm.  Pt medicated for pain prior to procedure but it was still very painful.  A member of the [place] team will plan to perform Vac change on Monday. Julien Girt MSN, RN, [place], [place], [place]

## 2016-09-03 NOTE — Progress Notes (Signed)
This RN notified Hillary Bow, MD about the results of the culture growth, will continue to monitor.

## 2016-09-03 NOTE — Care Management Important Message (Signed)
Important Message  Patient Details  Name: [name] MRN: [id number] Date of Birth: 02-09-1971   Medicare Important Message Given:  Yes    Orbie Pyo 09/03/2016, 12:20 PM

## 2016-09-03 NOTE — Progress Notes (Addendum)
      [address]       [place] [address]             [phone]      1 Day Post-Op Procedure(s) (LRB): IRRIGATION AND DEBRIDEMENT OF RIGHT STERNOCLAVICULAR JOINT, STERNUM AND RIBS (Right)   Subjective:  [name] complains of pain at her right incision site.  She otherwise has no complaints.   Objective: Vital signs in last 24 hours: Temp:  [97.6 F (36.4 C)-98.2 F (36.8 C)] 98.1 F (36.7 C) (05/25 0702) Pulse Rate:  [55-73] 55 (05/25 0702) Cardiac Rhythm: Sinus bradycardia (05/25 0700) Resp:  [16-24] 18 (05/25 0702) BP: (91-139)/(70-81) 132/74 (05/25 0702) SpO2:  [96 %-100 %] 99 % (05/25 0702) Arterial Line BP: (131-150)/(62-75) 140/73 (05/24 1030)  Intake/Output from previous day: 05/24 0701 - 05/25 0700 In: 960 [P.O.:60; I.V.:900] Out: 600 [Urine:400; Drains:50; Blood:150] Intake/Output this shift: Total I/O In: -  Out: 1000 [Urine:1000]  General appearance: alert, cooperative and no distress Heart: regular rate and rhythm Lungs: clear to auscultation bilaterally Wound: wound vac in place  Lab Results:  Recent Labs  09/01/16 0230 09/03/16 0216  WBC 13.3* 10.2  HGB 8.4* 8.4*  HCT 26.3* 26.5*  PLT 323 457*   BMET:  Recent Labs  08/31/16 1438 09/01/16 0230  NA 132* 136  K 3.3* 3.7  CL 98* 105  CO2 25 24  GLUCOSE 139* 111*  BUN 7 <5*  CREATININE 0.72 0.76  CALCIUM 8.1* 8.2*    PT/INR: No results for input(s): LABPROT, INR in the last 72 hours. ABG No results found for: PHART, HCO3, TCO2, ACIDBASEDEF, O2SAT CBG (last 3)  No results for input(s): GLUCAP in the last 72 hours.  Assessment/Plan: S/P Procedure(s) (LRB): IRRIGATION AND DEBRIDEMENT OF RIGHT STERNOCLAVICULAR JOINT, STERNUM AND RIBS (Right)  1. CV- hemodynamically stable, HR stable at 55 2. Wound care- vac in place, just placed yesterday morning, plan is for wound care to assess patient and perform dressing changes 3. IV Drug abuse- on Oxy and Methadone- pain  control per primary 4. Dispo- patient stable, will assess wound at vac change, continue current care   LOS: 3 days    Ellwood Handler 09/03/2016   Chart reviewed, patient examined, agree with above. Wound VAC changed by [place] nurse this am. The wound is clean. Cultures pending. Will consult plastic surgery to see if there are any different recommendations for wound care in addition to [facility].

## 2016-09-04 ENCOUNTER — Ambulatory Visit: Payer: Self-pay | Admitting: Plastic Surgery

## 2016-09-04 ENCOUNTER — Encounter (HOSPITAL_COMMUNITY): Payer: Self-pay | Admitting: *Deleted

## 2016-09-04 DIAGNOSIS — S21101A Unspecified open wound of right front wall of thorax without penetration into thoracic cavity, initial encounter: Secondary | ICD-10-CM

## 2016-09-04 LAB — BASIC METABOLIC PANEL
Anion gap: 5 (ref 5–15)
BUN: 9 mg/dL (ref 6–20)
CO2: 24 mmol/L (ref 22–32)
Calcium: 8 mg/dL — ABNORMAL LOW (ref 8.9–10.3)
Chloride: 104 mmol/L (ref 101–111)
Creatinine, Ser: 0.94 mg/dL (ref 0.44–1.00)
GFR calc Af Amer: >60 mL/min (ref >60)
GFR calc non Af Amer: >60 mL/min (ref >60)
Glucose, Bld: 112 mg/dL — ABNORMAL HIGH (ref 65–99)
Potassium: 3.8 mmol/L (ref 3.5–5.1)
Sodium: 133 mmol/L — ABNORMAL LOW (ref 135–145)

## 2016-09-04 LAB — VANCOMYCIN, TROUGH: Vancomycin Tr: 12 ug/mL — ABNORMAL LOW (ref 15–20)

## 2016-09-04 MED ORDER — VANCOMYCIN HCL 10 G IV SOLR
1250.0000 mg | Freq: Two times a day (BID) | INTRAVENOUS | Status: DC
  Administered 2016-09-04 – 2016-09-14 (×20): 1250 mg via INTRAVENOUS
  Filled 2016-09-04 (×20): qty 1250

## 2016-09-04 MED ORDER — SODIUM CHLORIDE 0.9% FLUSH
10.0000 mL | INTRAVENOUS | Status: DC | PRN
  Administered 2016-09-07 – 2016-09-11 (×6): 10 mL
  Filled 2016-09-04 (×6): qty 40

## 2016-09-04 NOTE — Progress Notes (Signed)
PROGRESS NOTE    [name]  YOV:[id number] DOB: 05/02/1970 DOA: 08/31/2016 PCP: Patient, [name]    Brief Narrative:  46 y.o. female with medical history significant of IVDA, septic joint, septic emboli, endocardititis with recent long hospitalization in dec of 2017 for absess to chest wall with septic joint and infected valve comes in with 3 days of developing mass/absess to the same area to right chest wall with redness that has spread down to right breast with associated pain with breathing and moving right shoulder.    Assessment & Plan:   Principal Problem:   Septic arthritis of right sternoclavicular joint (HCC)/ Abscess of chest (HCC) - Given pain and history of Heroin use added methadone to try and control discomfort from active infection site. - continue current IV antibiotics, will consult ID next am for recommendations with antibiotic regimen moving forward - Cardiothoracic surgeon consulted and pt is s/p: Incision and debridement of right sternoclavicular joint, sternal head of clavicle, head of first rib and first costoclavicular joint, application of wound vac.  Active Problems:   Polysubstance abuse - Stable currently    IV drug abuse   Hepatitis C infection    COPD (chronic obstructive pulmonary disease) (HCC) - stable currently.    Endocarditis of tricuspid valve   DVT prophylaxis: SCD Code Status: Full Family Communication: none at bedside Disposition Plan: discussed with treatment team. Patient may represent difficult placement secondary to her history and her needs.   Consultants:   Cardiothoracic surgeon   Procedures: None   Antimicrobials: Cefepime, vancomycin   Subjective: No new problems reported. No acute issues overnight.  Objective: Vitals:   09/03/16 1443 09/03/16 2000 09/04/16 0418 09/04/16 1600  BP: 135/72 133/70 (!) 114/59 135/73  Pulse: 60 71 62 (!) 56  Resp: 18 18 18 14   Temp:  98.1 F (36.7 C) 98 F (36.7 C) 98.2 F  (36.8 C)  TempSrc:  Oral Oral Oral  SpO2: 100% 99% 99% 99%  Weight:      Height:        Intake/Output Summary (Last 24 hours) at 09/04/16 1805 Last data filed at 09/04/16 1800  Gross per 24 hour  Intake              350 ml  Output              100 ml  Net              250 ml   Filed Weights   08/31/16 1213 08/31/16 2222  Weight: 72.6 kg (160 lb) 74.6 kg (164 lb 6.4 oz)    Examination:  General exam: uncomfortably but in NAD Respiratory system: Clear to auscultation. Respiratory effort normal. Cardiovascular system: S1 & S2 heard, RRR. No JVD, murmurs, rubs, gallops or clicks. No pedal edema. Gastrointestinal system: Abdomen is nondistended, soft and nontender. No organomegaly or masses felt. Normal bowel sounds heard. Central nervous system: Alert and oriented. No focal neurological deficits. Extremities: Symmetric 5 x 5 power. Skin:  wound vac in place at chest Psychiatry:Mood & affect appropriate.     Data Reviewed: I have personally reviewed following labs and imaging studies  CBC:  Recent Labs Lab 08/31/16 1438 09/01/16 0230 09/03/16 0216  WBC 13.4* 13.3* 10.2  NEUTROABS 8.0*  --   --   HGB 8.3* 8.4* 8.4*  HCT 25.4* 26.3* 26.5*  MCV 75.4* 74.9* 75.1*  PLT 287 323 741*   Basic Metabolic Panel:  Recent Labs Lab 08/31/16  1438 09/01/16 0230 09/04/16 1439  NA 132* 136 133*  K 3.3* 3.7 3.8  CL 98* 105 104  CO2 25 24 24   GLUCOSE 139* 111* 112*  BUN 7 <5* 9  CREATININE 0.72 0.76 0.94  CALCIUM 8.1* 8.2* 8.0*   GFR: Estimated Creatinine Clearance: 70.5 mL/min (by C-G formula based on SCr of 0.94 mg/dL). Liver Function Tests:  Recent Labs Lab 08/31/16 1438  AST 17  ALT 9*  ALKPHOS 51  BILITOT 0.5  PROT 7.4  ALBUMIN 2.5*   No results for input(s): LIPASE, AMYLASE in the last 168 hours. No results for input(s): AMMONIA in the last 168 hours. Coagulation Profile: No results for input(s): INR, PROTIME in the last 168 hours. Cardiac Enzymes: No  results for input(s): CKTOTAL, CKMB, CKMBINDEX, TROPONINI in the last 168 hours. BNP (last 3 results) No results for input(s): PROBNP in the last 8760 hours. HbA1C: No results for input(s): HGBA1C in the last 72 hours. CBG: No results for input(s): GLUCAP in the last 168 hours. Lipid Profile: No results for input(s): CHOL, HDL, LDLCALC, TRIG, CHOLHDL, LDLDIRECT in the last 72 hours. Thyroid Function Tests: No results for input(s): TSH, T4TOTAL, FREET4, T3FREE, THYROIDAB in the last 72 hours. Anemia Panel: No results for input(s): VITAMINB12, FOLATE, FERRITIN, TIBC, IRON, RETICCTPCT in the last 72 hours. Sepsis Labs:  Recent Labs Lab 08/31/16 1457  LATICACIDVEN 1.87    Recent Results (from the past 240 hour(s))  Culture, blood (Routine X 2) w Reflex to ID Panel     Status: None (Preliminary result)   Collection Time: 08/31/16  2:55 PM  Result Value Ref Range Status   Specimen Description LEFT ANTECUBITAL  Final   Special Requests   Final    BOTTLES DRAWN AEROBIC AND ANAEROBIC Blood Culture adequate volume   Culture NO GROWTH 4 DAYS  Final   Report Status PENDING  Incomplete  Culture, blood (Routine X 2) w Reflex to ID Panel     Status: None (Preliminary result)   Collection Time: 08/31/16  3:04 PM  Result Value Ref Range Status   Specimen Description RIGHT ANTECUBITAL  Final   Special Requests   Final    BOTTLES DRAWN AEROBIC AND ANAEROBIC Blood Culture adequate volume   Culture NO GROWTH 4 DAYS  Final   Report Status PENDING  Incomplete  MRSA PCR Screening     Status: None   Collection Time: 08/31/16 10:26 PM  Result Value Ref Range Status   MRSA by PCR NEGATIVE NEGATIVE Final    Comment:        The GeneXpert MRSA Assay (FDA approved for NASAL specimens only), is one component of a comprehensive MRSA colonization surveillance program. It is not intended to diagnose MRSA infection nor to guide or monitor treatment for MRSA infections.   Aerobic/Anaerobic Culture  (surgical/deep wound)     Status: None (Preliminary result)   Collection Time: 09/02/16  8:31 AM  Result Value Ref Range Status   Specimen Description ABSCESS  Final   Special Requests RIGHT STERNOCLAVICULAR POF VANC AND CEFEPINE  Final   Gram Stain   Final    FEW WBC PRESENT, PREDOMINANTLY PMN FEW GRAM POSITIVE COCCI RARE GRAM VARIABLE ROD    Culture   Final    MODERATE METHICILLIN RESISTANT STAPHYLOCOCCUS AUREUS   Report Status PENDING  Incomplete   Organism ID, Bacteria METHICILLIN RESISTANT STAPHYLOCOCCUS AUREUS  Final      Susceptibility   Methicillin resistant staphylococcus aureus - MIC*  CIPROFLOXACIN >=8 RESISTANT Resistant     ERYTHROMYCIN >=8 RESISTANT Resistant     GENTAMICIN <=0.5 SENSITIVE Sensitive     OXACILLIN >=4 RESISTANT Resistant     TETRACYCLINE <=1 SENSITIVE Sensitive     VANCOMYCIN 1 SENSITIVE Sensitive     TRIMETH/SULFA <=10 SENSITIVE Sensitive     CLINDAMYCIN >=8 RESISTANT Resistant     RIFAMPIN <=0.5 SENSITIVE Sensitive     Inducible Clindamycin NEGATIVE Sensitive     * MODERATE METHICILLIN RESISTANT STAPHYLOCOCCUS AUREUS  Fungus Culture With Stain     Status: None (Preliminary result)   Collection Time: 09/02/16  8:31 AM  Result Value Ref Range Status   Fungus Stain Final report  Final    Comment: (NOTE) Performed At: [facility] [place], [place] [id number] Lindon Romp MD [id number]    Fungus (Mycology) Culture PENDING  Incomplete   Fungal Source ABSCESS  Final    Comment: RIGHT STERNOCLAVICULAR POF VANC AND CEFEPINE  Fungus Culture Result     Status: None   Collection Time: 09/02/16  8:31 AM  Result Value Ref Range Status   Result 1 Comment  Final    Comment: (NOTE) KOH/Calcofluor preparation:  no fungus observed. Performed At: [facility] [place], [place] [id number] Lindon Romp MD [id number]   Aerobic/Anaerobic Culture (surgical/deep wound)     Status: None (Preliminary  result)   Collection Time: 09/02/16  8:46 AM  Result Value Ref Range Status   Specimen Description TISSUE  Final   Special Requests RIGHT STERNOCLAVICULAR TIS POF VANC AND CEFEPINE  Final   Gram Stain   Final    FEW WBC PRESENT, PREDOMINANTLY PMN NO ORGANISMS SEEN    Culture   Final    FEW METHICILLIN RESISTANT STAPHYLOCOCCUS AUREUS CRITICAL RESULT CALLED TO, READ BACK BY AND VERIFIED WITH: F. PASEDA RN, AT 1235 09/03/16 BY D.VANHOOK REGARDING CULTURE GROWTH CULTURE REINCUBATED FOR BETTER GROWTH    Report Status PENDING  Incomplete   Organism ID, Bacteria METHICILLIN RESISTANT STAPHYLOCOCCUS AUREUS  Final      Susceptibility   Methicillin resistant staphylococcus aureus - MIC*    CIPROFLOXACIN >=8 RESISTANT Resistant     ERYTHROMYCIN >=8 RESISTANT Resistant     GENTAMICIN <=0.5 SENSITIVE Sensitive     OXACILLIN >=4 RESISTANT Resistant     TETRACYCLINE <=1 SENSITIVE Sensitive     VANCOMYCIN 1 SENSITIVE Sensitive     TRIMETH/SULFA <=10 SENSITIVE Sensitive     CLINDAMYCIN >=8 RESISTANT Resistant     RIFAMPIN <=0.5 SENSITIVE Sensitive     Inducible Clindamycin NEGATIVE Sensitive     * FEW METHICILLIN RESISTANT STAPHYLOCOCCUS AUREUS  Fungus Culture With Stain     Status: None (Preliminary result)   Collection Time: 09/02/16  8:46 AM  Result Value Ref Range Status   Fungus Stain Final report  Final    Comment: (NOTE) Performed At: [facility] [place], [place] [id number] Lindon Romp MD [id number]    Fungus (Mycology) Culture PENDING  Incomplete   Fungal Source TISSUE  Final    Comment: RIGHT STERNOCLAVIVULAR POF VANC AND CEFEPINE  Fungus Culture Result     Status: None   Collection Time: 09/02/16  8:46 AM  Result Value Ref Range Status   Result 1 Comment  Final    Comment: (NOTE) KOH/Calcofluor preparation:  no fungus observed. Performed At: [facility] [place], [place] [id number] Lindon Romp MD [id number]  Radiology Studies: No results found.  Scheduled Meds: . methadone  5 mg Oral Q12H   Continuous Infusions: . ceFEPime (MAXIPIME) IV Stopped (09/04/16 1408)  . vancomycin Stopped (09/04/16 1717)     LOS: 4 days    Time spent: > 35 minutes  Velvet Bathe, MD [facility] Pager [phone]  If 7PM-7AM, please contact night-coverage [email or web address] Password [facility] 09/04/2016, 6:05 PM

## 2016-09-04 NOTE — Consult Note (Signed)
Reason for Consult: chest wall wound Referring Physician: Dr. Gilford Raid  [name] is an 46 y.o. female.  HPI: The patient is a 46 years old wf here for treatment of a wound on the chest wall. She was recently treated for IVDA, endocarditis and septic emboli and an infected valve 2017.  She came to the ED 3 days ago with a painful, red, swollen mass of the right chest, breast and shoulder.  She has a recent history of IV heroin abuse in the upper extremities.  She underwent debridement in the OR for the right clavicular joint abscess.  According to the notes it appears this is the same area treated from November 2017.  The [facility] is currently in place with an excellent seal.    Past Medical History:  Diagnosis Date  . Anxiety   . Chronic back pain   . Chronic knee pain   . COPD (chronic obstructive pulmonary disease) ([place])   . DDD (degenerative disc disease)   . Hepatitis C infection 07/13/2014  . IV drug abuse   . Nephrolithiasis   . Panic attacks   . Polysubstance abuse    cocaine, opiates, marijuana, amphetamines, "molly," crystal meth  . Pulmonary embolism [facility])     Past Surgical History:  Procedure Laterality Date  . CESAREAN SECTION    . I&D EXTREMITY Right 09/02/2016   Procedure: IRRIGATION AND DEBRIDEMENT OF RIGHT STERNOCLAVICULAR JOINT, STERNUM AND RIBS;  Surgeon: Gaye Pollack, MD;  Location: [place] OR;  Service: Vascular;  Laterality: Right;  . NOSE SURGERY    . STERNAL WOUND DEBRIDEMENT N/A 02/22/2016   Procedure: IRRIGATION AND DEBRIDEMENT MANUBRIUM ABSCESS with wound cultures;  Surgeon: Melrose Nakayama, MD;  Location: [place];  Service: Thoracic;  Laterality: N/A;  . STERNAL WOUND DEBRIDEMENT N/A 02/23/2016   Procedure: DRESSING CHANGE UNDER ANESTHESIA  WITH WOUND VAC PLACEMENT;  Surgeon: Melrose Nakayama, MD;  Location: [place];  Service: Thoracic;  Laterality: N/A;  . TEE WITHOUT CARDIOVERSION N/A 02/23/2016   Procedure: TRANSESOPHAGEAL ECHOCARDIOGRAM (TEE);   Surgeon: Jerline Pain, MD;  Location: [facility] ENDOSCOPY;  Service: Cardiovascular;  Laterality: N/A;  . TUBAL LIGATION     "tied, burned, clipped"    Family History  Problem Relation Age of Onset  . COPD Mother   . Hypertension Father   . Heart attack Father   . Diabetes Father   . Arthritis Unknown   . Lung disease Unknown   . Cancer Unknown   . Asthma Unknown     Social History:  reports that she has been smoking Cigarettes.  She has a 32.00 pack-year smoking history. She has never used smokeless tobacco. She reports that she uses drugs, including IV, Cocaine, Marijuana, Methamphetamines, Heroin, and Other-see comments. She reports that she does not drink alcohol.  Allergies:  Allergies  Allergen Reactions  . Doxycycline Other (See Comments)    burns  . Mango Flavor Hives and Swelling  . Pyridium [Phenazopyridine Hcl] Other (See Comments)    Irritation to skin  . Tramadol Nausea And Vomiting    Medications: I have reviewed the patient's current medications.  Results for orders placed or performed during the hospital encounter of 08/31/16 (from the past 48 hour(s))  CBC     Status: Abnormal   Collection Time: 09/03/16  2:16 AM  Result Value Ref Range   WBC 10.2 4.0 - 10.5 K/uL   RBC 3.53 (L) 3.87 - 5.11 MIL/uL   Hemoglobin 8.4 (L) 12.0 -  15.0 g/dL   HCT 26.5 (L) 36.0 - 46.0 %   MCV 75.1 (L) 78.0 - 100.0 fL   MCH 23.8 (L) 26.0 - 34.0 pg   MCHC 31.7 30.0 - 36.0 g/dL   RDW 17.6 (H) 11.5 - 15.5 %   Platelets 457 (H) 150 - 400 K/uL    No results found.  Review of Systems  Constitutional: Positive for fever and malaise/fatigue.  HENT: Negative.   Eyes: Negative.   Respiratory: Negative.   Cardiovascular: Negative.   Gastrointestinal: Negative.   Genitourinary: Negative.   Musculoskeletal: Negative.   Skin: Negative.   Neurological: Negative.   Psychiatric/Behavioral: Negative.    Blood pressure (!) 114/59, pulse 62, temperature 98 F (36.7 C), temperature source  Oral, resp. rate 18, height 5\' 1"  (1.549 m), weight 74.6 kg (164 lb 6.4 oz), last menstrual period 07/01/2016, SpO2 99 %. Physical Exam  Constitutional: She is oriented to person, place, and time. She appears well-developed and well-nourished.  HENT:  Head: Normocephalic and atraumatic.  Eyes: EOM are normal. Pupils are equal, round, and reactive to light.  Cardiovascular: Normal rate.   Respiratory: No respiratory distress.    GI: She exhibits no distension.  Neurological: She is alert and oriented to person, place, and time.  Skin: Skin is warm.  Psychiatric: She has a normal mood and affect. Her behavior is normal. Thought content normal.    Assessment/Plan: Right sternoclavicular joint wound.  Will plan to debride in the OR with ACell and VAC placement.  A flap may be needed but risky in this type of patient.  Will attempt more conservative treatment first.  Needs to increase her protein intake, multivitamin daily, Vit C 500 mg twice daily and zinc 220 mg,   [name] 09/04/2016, 1:45 PM

## 2016-09-04 NOTE — Progress Notes (Addendum)
[address]       [place] [address]             [phone]      2 Days Post-Op Procedure(s) (LRB): IRRIGATION AND DEBRIDEMENT OF RIGHT STERNOCLAVICULAR JOINT, STERNUM AND RIBS (Right) Subjective: Feels ok, pain is controlled  Objective: Vital signs in last 24 hours: Temp:  [98 F (36.7 C)-98.1 F (36.7 C)] 98 F (36.7 C) (05/26 0418) Pulse Rate:  [60-71] 62 (05/26 0418) Cardiac Rhythm: Sinus bradycardia (05/26 0700) Resp:  [18] 18 (05/26 0418) BP: (114-135)/(59-72) 114/59 (05/26 0418) SpO2:  [99 %-100 %] 99 % (05/26 0418)  Hemodynamic parameters for last 24 hours:    Intake/Output from previous day: 05/25 0701 - 05/26 0700 In: 480 [P.O.:480] Out: 1050 [Urine:1000; Drains:50] Intake/Output this shift: No intake/output data recorded.  General appearance: alert, cooperative and no distress Heart: regular rate and rhythm Lungs: clear to auscultation bilaterally Abdomen: benign Extremities: no edema Wound: VAC in place, no surrounding cellulitis  Lab Results:  Recent Labs  09/03/16 0216  WBC 10.2  HGB 8.4*  HCT 26.5*  PLT 457*   BMET: No results for input(s): NA, K, CL, CO2, GLUCOSE, BUN, CREATININE, CALCIUM in the last 72 hours.  PT/INR: No results for input(s): LABPROT, INR in the last 72 hours. ABG No results found for: PHART, HCO3, TCO2, ACIDBASEDEF, O2SAT CBG (last 3)  No results for input(s): GLUCAP in the last 72 hours.  Meds Scheduled Meds: . methadone  5 mg Oral Q12H   Continuous Infusions: . ceFEPime (MAXIPIME) IV Stopped (09/04/16 0554)  . vancomycin Stopped (09/04/16 0254)   PRN Meds:.ketorolac, ondansetron **OR** ondansetron (ZOFRAN) IV, oxyCODONE, zolpidem  Xrays Dg Chest Port 1 View  Result Date: 09/02/2016 CLINICAL DATA:  Central line placement EXAM: PORTABLE CHEST 1 VIEW COMPARISON:  08/31/2016 FINDINGS: Left central line tip is in the SVC. No pneumothorax. Heart and mediastinal contours are within normal  limits. No focal opacities or effusions. No acute bony abnormality. IMPRESSION: Left central line tip in the SVC. No active cardiopulmonary disease. Electronically Signed   By: Rolm Baptise M.D.   On: 09/02/2016 10:48    Results for orders placed or performed during the hospital encounter of 08/31/16  Culture, blood (Routine X 2) w Reflex to ID Panel     Status: None (Preliminary result)   Collection Time: 08/31/16  2:55 PM  Result Value Ref Range Status   Specimen Description LEFT ANTECUBITAL  Final   Special Requests   Final    BOTTLES DRAWN AEROBIC AND ANAEROBIC Blood Culture adequate volume   Culture NO GROWTH 3 DAYS  Final   Report Status PENDING  Incomplete  Culture, blood (Routine X 2) w Reflex to ID Panel     Status: None (Preliminary result)   Collection Time: 08/31/16  3:04 PM  Result Value Ref Range Status   Specimen Description RIGHT ANTECUBITAL  Final   Special Requests   Final    BOTTLES DRAWN AEROBIC AND ANAEROBIC Blood Culture adequate volume   Culture NO GROWTH 3 DAYS  Final   Report Status PENDING  Incomplete  MRSA PCR Screening     Status: None   Collection Time: 08/31/16 10:26 PM  Result Value Ref Range Status   MRSA by PCR NEGATIVE NEGATIVE Final    Comment:        The GeneXpert MRSA Assay (FDA approved for NASAL specimens only), is one component of a comprehensive MRSA colonization  surveillance program. It is not intended to diagnose MRSA infection nor to guide or monitor treatment for MRSA infections.   Aerobic/Anaerobic Culture (surgical/deep wound)     Status: None (Preliminary result)   Collection Time: 09/02/16  8:31 AM  Result Value Ref Range Status   Specimen Description ABSCESS  Final   Special Requests RIGHT STERNOCLAVICULAR POF VANC AND CEFEPINE  Final   Gram Stain   Final    FEW WBC PRESENT, PREDOMINANTLY PMN FEW GRAM POSITIVE COCCI RARE GRAM VARIABLE ROD    Culture   Final    MODERATE STAPHYLOCOCCUS AUREUS SUSCEPTIBILITIES TO FOLLOW      Report Status PENDING  Incomplete  Aerobic/Anaerobic Culture (surgical/deep wound)     Status: None (Preliminary result)   Collection Time: 09/02/16  8:46 AM  Result Value Ref Range Status   Specimen Description TISSUE  Final   Special Requests RIGHT STERNOCLAVICULAR TIS POF VANC AND CEFEPINE  Final   Gram Stain   Final    FEW WBC PRESENT, PREDOMINANTLY PMN NO ORGANISMS SEEN    Culture   Final    FEW STAPHYLOCOCCUS AUREUS CALLED F. PASEDA RN, AT 1235 09/03/16 BY D.VANHOOK REGARDING CULTURE GROWTH SUSCEPTIBILITIES TO FOLLOW    Report Status PENDING  Incomplete    Assessment/Plan: S/P Procedure(s) (LRB): IRRIGATION AND DEBRIDEMENT OF RIGHT STERNOCLAVICULAR JOINT, STERNUM AND RIBS (Right)  1 stable overall 2 cont VAC, conts current abx- cultures pending 3 pain controlled 4 medical management per primary   LOS: 4 days    GOLD,WAYNE E 09/04/2016  I have seen and examined [name] and agree with the above assessment  and plan.  Grace Isaac MD Beeper [phone] Office [phone] 09/04/2016 11:02 AM

## 2016-09-04 NOTE — Progress Notes (Signed)
Pharmacy Antibiotic Note OPHA MCGHEE is a 46 y.o. female admitted on 08/31/2016 with recurrent abscess/septic joint. 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 Currently on day #5 of vancomycin and cefepime. A vancomycin trough was drawn today and was subtherapeutic at 12, the trough was also drawn a little late, making the true trough a little higher.    Plan: -Increase vancomycin to 1250 mg IV q12h -Continue cefepime 2 g IV q8h -Monitor renal function, cultures, duration of therapy   Height: 5\' 1"  (154.9 cm) Weight: 164 lb 6.4 oz (74.6 kg) IBW/kg (Calculated) : 47.8  Temp (24hrs), Avg:98.1 F (36.7 C), Min:98 F (36.7 C), Max:98.1 F (36.7 C)   Recent Labs Lab 08/31/16 1438 08/31/16 1457 09/01/16 0230 09/03/16 0216 09/04/16 1438 09/04/16 1439  WBC 13.4*  --  13.3* 10.2  --   --   CREATININE 0.72  --  0.76  --   --  0.94  LATICACIDVEN  --  1.87  --   --   --   --   VANCOTROUGH  --   --   --   --  12*  --     Estimated Creatinine Clearance: 70.5 mL/min (by C-G formula based on SCr of 0.94 mg/dL).    Allergies  Allergen Reactions  . Doxycycline Other (See Comments)    burns  . Mango Flavor Hives and Swelling  . Pyridium [Phenazopyridine Hcl] Other (See Comments)    Irritation to skin  . Tramadol Nausea And Vomiting     Hughes Better, PharmD, BCPS Clinical Pharmacist 09/04/2016 3:26 PM

## 2016-09-05 LAB — TYPE AND SCREEN
ABO/RH(D): A POS
Antibody Screen: NEGATIVE
Unit division: 0
Unit division: 0
Unit division: 0
Unit division: 0

## 2016-09-05 LAB — CULTURE, BLOOD (ROUTINE X 2)
Culture: NO GROWTH
Culture: NO GROWTH
Special Requests: ADEQUATE
Special Requests: ADEQUATE

## 2016-09-05 LAB — BPAM RBC
Blood Product Expiration Date: 201806042359
Blood Product Expiration Date: 201806042359
Blood Product Expiration Date: 201806052359
Blood Product Expiration Date: 201806052359
ISSUE DATE / TIME: 201805240741
ISSUE DATE / TIME: 201805240741
Unit Type and Rh: 6200
Unit Type and Rh: 6200
Unit Type and Rh: 6200
Unit Type and Rh: 6200

## 2016-09-05 NOTE — Progress Notes (Signed)
PROGRESS NOTE    [name]  GEZ:[id number] DOB: [birth year] DOA: 08/31/2016 PCP: Patient, [name]    Brief Narrative:  46 y.o. female with medical history significant of IVDA, septic joint, septic emboli, endocardititis with recent long hospitalization in dec of 2017 for absess to chest wall with septic joint and infected valve comes in with 3 days of developing mass/absess to the same area to right chest wall with redness that has spread down to right breast with associated pain with breathing and moving right shoulder.    Assessment & Plan:   Principal Problem:   Septic arthritis of right sternoclavicular joint (H[facility])/ Abscess of chest ([place]) - Stop methadone given bradycardia - Cardiothoracic surgeon consulted and pt is s/p: Incision and debridement of right sternoclavicular joint, sternal head of clavicle, head of first rib and first costoclavicular joint, application of wound vac. Plan is for further debridement on tuesday  Bradycardia - will stop methadone  Active Problems:   Polysubstance abuse - Stable currently    IV drug abuse   Hepatitis C infection    COPD (chronic obstructive pulmonary disease) (HCC) - stable currently.    Endocarditis of tricuspid valve   DVT prophylaxis: SCD Code Status: Full Family Communication: none at bedside Disposition Plan: discussed with treatment team. Patient may represent difficult placement secondary to her history and her needs.   Consultants:   Cardiothoracic surgeon   Procedures: None   Antimicrobials: Cefepime, vancomycin   Subjective: She reports no new problems  Objective: Vitals:   09/04/16 1600 09/04/16 2038 09/05/16 0538 09/05/16 1443  BP: 135/73 131/71 136/70 133/68  Pulse: (!) 56 (!) 53 (!) 50 (!) 52  Resp: 14 16 18 18   Temp: 98.2 F (36.8 C) 98.1 F (36.7 C) 98.2 F (36.8 C) 98.3 F (36.8 C)  TempSrc: Oral Oral Oral Oral  SpO2: 99% 100% 98% 99%  Weight:      Height:        Intake/Output  Summary (Last 24 hours) at 09/05/16 1640 Last data filed at 09/05/16 0713  Gross per 24 hour  Intake              350 ml  Output             1675 ml  Net            -1325 ml   Filed Weights   08/31/16 1213 08/31/16 2222  Weight: 72.6 kg (160 lb) 74.6 kg (164 lb 6.4 oz)    Examination:  General exam: uncomfortably but in NAD Respiratory system: Clear to auscultation. Respiratory effort normal. Cardiovascular system: S1 & S2 heard, RRR. No JVD, murmurs, rubs, gallops or clicks. No pedal edema. Gastrointestinal system: Abdomen is nondistended, soft and nontender. No organomegaly or masses felt. Normal bowel sounds heard. Central nervous system: Alert and oriented. No focal neurological deficits. Extremities: Symmetric 5 x 5 power. Skin:  wound vac in place at chest Psychiatry: Mood & affect appropriate.   Data Reviewed: I have personally reviewed following labs and imaging studies  CBC:  Recent Labs Lab 08/31/16 1438 09/01/16 0230 09/03/16 0216  WBC 13.4* 13.3* 10.2  NEUTROABS 8.0*  --   --   HGB 8.3* 8.4* 8.4*  HCT 25.4* 26.3* 26.5*  MCV 75.4* 74.9* 75.1*  PLT 287 323 650*   Basic Metabolic Panel:  Recent Labs Lab 08/31/16 1438 09/01/16 0230 09/04/16 1439  NA 132* 136 133*  K 3.3* 3.7 3.8  CL 98* 105 104  CO2 25 24 24   GLUCOSE 139* 111* 112*  BUN 7 <5* 9  CREATININE 0.72 0.76 0.94  CALCIUM 8.1* 8.2* 8.0*   GFR: Estimated Creatinine Clearance: 70.5 mL/min (by C-G formula based on SCr of 0.94 mg/dL). Liver Function Tests:  Recent Labs Lab 08/31/16 1438  AST 17  ALT 9*  ALKPHOS 51  BILITOT 0.5  PROT 7.4  ALBUMIN 2.5*   No results for input(s): LIPASE, AMYLASE in the last 168 hours. No results for input(s): AMMONIA in the last 168 hours. Coagulation Profile: No results for input(s): INR, PROTIME in the last 168 hours. Cardiac Enzymes: No results for input(s): CKTOTAL, CKMB, CKMBINDEX, TROPONINI in the last 168 hours. BNP (last 3 results) No  results for input(s): PROBNP in the last 8760 hours. HbA1C: No results for input(s): HGBA1C in the last 72 hours. CBG: No results for input(s): GLUCAP in the last 168 hours. Lipid Profile: No results for input(s): CHOL, HDL, LDLCALC, TRIG, CHOLHDL, LDLDIRECT in the last 72 hours. Thyroid Function Tests: No results for input(s): TSH, T4TOTAL, FREET4, T3FREE, THYROIDAB in the last 72 hours. Anemia Panel: No results for input(s): VITAMINB12, FOLATE, FERRITIN, TIBC, IRON, RETICCTPCT in the last 72 hours. Sepsis Labs:  Recent Labs Lab 08/31/16 1457  LATICACIDVEN 1.87    Recent Results (from the past 240 hour(s))  Culture, blood (Routine X 2) w Reflex to ID Panel     Status: None   Collection Time: 08/31/16  2:55 PM  Result Value Ref Range Status   Specimen Description LEFT ANTECUBITAL  Final   Special Requests   Final    BOTTLES DRAWN AEROBIC AND ANAEROBIC Blood Culture adequate volume   Culture NO GROWTH 5 DAYS  Final   Report Status 09/05/2016 FINAL  Final  Culture, blood (Routine X 2) w Reflex to ID Panel     Status: None   Collection Time: 08/31/16  3:04 PM  Result Value Ref Range Status   Specimen Description RIGHT ANTECUBITAL  Final   Special Requests   Final    BOTTLES DRAWN AEROBIC AND ANAEROBIC Blood Culture adequate volume   Culture NO GROWTH 5 DAYS  Final   Report Status 09/05/2016 FINAL  Final  MRSA PCR Screening     Status: None   Collection Time: 08/31/16 10:26 PM  Result Value Ref Range Status   MRSA by PCR NEGATIVE NEGATIVE Final    Comment:        The GeneXpert MRSA Assay (FDA approved for NASAL specimens only), is one component of a comprehensive MRSA colonization surveillance program. It is not intended to diagnose MRSA infection nor to guide or monitor treatment for MRSA infections.   Aerobic/Anaerobic Culture (surgical/deep wound)     Status: None (Preliminary result)   Collection Time: 09/02/16  8:31 AM  Result Value Ref Range Status   Specimen  Description ABSCESS  Final   Special Requests RIGHT STERNOCLAVICULAR POF VANC AND CEFEPINE  Final   Gram Stain   Final    FEW WBC PRESENT, PREDOMINANTLY PMN FEW GRAM POSITIVE COCCI RARE GRAM VARIABLE ROD    Culture   Final    MODERATE METHICILLIN RESISTANT STAPHYLOCOCCUS AUREUS   Report Status PENDING  Incomplete   Organism ID, Bacteria METHICILLIN RESISTANT STAPHYLOCOCCUS AUREUS  Final      Susceptibility   Methicillin resistant staphylococcus aureus - MIC*    CIPROFLOXACIN >=8 RESISTANT Resistant     ERYTHROMYCIN >=8 RESISTANT Resistant     GENTAMICIN <=0.5 SENSITIVE Sensitive  OXACILLIN >=4 RESISTANT Resistant     TETRACYCLINE <=1 SENSITIVE Sensitive     VANCOMYCIN 1 SENSITIVE Sensitive     TRIMETH/SULFA <=10 SENSITIVE Sensitive     CLINDAMYCIN >=8 RESISTANT Resistant     RIFAMPIN <=0.5 SENSITIVE Sensitive     Inducible Clindamycin NEGATIVE Sensitive     * MODERATE METHICILLIN RESISTANT STAPHYLOCOCCUS AUREUS  Fungus Culture With Stain     Status: None (Preliminary result)   Collection Time: 09/02/16  8:31 AM  Result Value Ref Range Status   Fungus Stain Final report  Final    Comment: (NOTE) Performed At: [facility] [place], [place] [id number] Lindon Romp MD [id number]    Fungus (Mycology) Culture PENDING  Incomplete   Fungal Source ABSCESS  Final    Comment: RIGHT STERNOCLAVICULAR POF VANC AND CEFEPINE  Fungus Culture Result     Status: None   Collection Time: 09/02/16  8:31 AM  Result Value Ref Range Status   Result 1 Comment  Final    Comment: (NOTE) KOH/Calcofluor preparation:  no fungus observed. Performed At: [facility] [place], [place] [id number] Lindon Romp MD [id number]   Aerobic/Anaerobic Culture (surgical/deep wound)     Status: None (Preliminary result)   Collection Time: 09/02/16  8:46 AM  Result Value Ref Range Status   Specimen Description TISSUE  Final   Special Requests RIGHT  STERNOCLAVICULAR TIS POF VANC AND CEFEPINE  Final   Gram Stain   Final    FEW WBC PRESENT, PREDOMINANTLY PMN NO ORGANISMS SEEN    Culture   Final    FEW METHICILLIN RESISTANT STAPHYLOCOCCUS AUREUS RARE STAPHYLOCOCCUS SPECIES (COAGULASE NEGATIVE) SUSCEPTIBILITIES TO FOLLOW CRITICAL RESULT CALLED TO, READ BACK BY AND VERIFIED WITH: F. PASEDA RN, AT 1235 09/03/16 BY D.VANHOOK REGARDING CULTURE GROWTH    Report Status PENDING  Incomplete   Organism ID, Bacteria METHICILLIN RESISTANT STAPHYLOCOCCUS AUREUS  Final      Susceptibility   Methicillin resistant staphylococcus aureus - MIC*    CIPROFLOXACIN >=8 RESISTANT Resistant     ERYTHROMYCIN >=8 RESISTANT Resistant     GENTAMICIN <=0.5 SENSITIVE Sensitive     OXACILLIN >=4 RESISTANT Resistant     TETRACYCLINE <=1 SENSITIVE Sensitive     VANCOMYCIN 1 SENSITIVE Sensitive     TRIMETH/SULFA <=10 SENSITIVE Sensitive     CLINDAMYCIN >=8 RESISTANT Resistant     RIFAMPIN <=0.5 SENSITIVE Sensitive     Inducible Clindamycin NEGATIVE Sensitive     * FEW METHICILLIN RESISTANT STAPHYLOCOCCUS AUREUS  Fungus Culture With Stain     Status: None (Preliminary result)   Collection Time: 09/02/16  8:46 AM  Result Value Ref Range Status   Fungus Stain Final report  Final    Comment: (NOTE) Performed At: [facility] [place], [place] [id number] Lindon Romp MD [id number]    Fungus (Mycology) Culture PENDING  Incomplete   Fungal Source TISSUE  Final    Comment: RIGHT STERNOCLAVIVULAR POF VANC AND CEFEPINE  Fungus Culture Result     Status: None   Collection Time: 09/02/16  8:46 AM  Result Value Ref Range Status   Result 1 Comment  Final    Comment: (NOTE) KOH/Calcofluor preparation:  no fungus observed. Performed At: [facility] [address] [place], [place] [id number] Lindon Romp MD [id number]      Radiology Studies: No results found.  Scheduled Meds:  Continuous Infusions: . ceFEPime  (MAXIPIME) IV Stopped (09/05/16 1443)  .  vancomycin 1,250 mg (09/05/16 1547)     LOS: 5 days    Time spent: > 35 minutes  Velvet Bathe, MD [facility] Pager [phone]  If 7PM-7AM, please contact night-coverage [email or web address] Password [email or web address] 09/05/2016, 4:40 PM

## 2016-09-05 NOTE — Progress Notes (Addendum)
[address]       [place] [address]             [phone]      3 Days Post-Op Procedure(s) (LRB): IRRIGATION AND DEBRIDEMENT OF RIGHT STERNOCLAVICULAR JOINT, STERNUM AND RIBS (Right) Subjective: Pain controlled pretty well  Objective: Vital signs in last 24 hours: Temp:  [98.1 F (36.7 C)-98.2 F (36.8 C)] 98.2 F (36.8 C) (05/27 0538) Pulse Rate:  [50-56] 50 (05/27 0538) Cardiac Rhythm: Sinus bradycardia (05/26 1900) Resp:  [14-18] 18 (05/27 0538) BP: (131-136)/(70-73) 136/70 (05/27 0538) SpO2:  [98 %-100 %] 98 % (05/27 0538)  Hemodynamic parameters for last 24 hours:    Intake/Output from previous day: 05/26 0701 - 05/27 0700 In: 700 [IV Piggyback:700] Out: 2250 [Urine:2200; Drains:50] Intake/Output this shift: Total I/O In: -  Out: 25 [Drains:25]  General appearance: alert, cooperative and no distress Heart: regular rate and rhythm and brady Lungs: clear to auscultation bilaterally Abdomen: benign Extremities: no edema Wound: VAC in place- serosang drainage- no cellulitis  Lab Results:  Recent Labs  09/03/16 0216  WBC 10.2  HGB 8.4*  HCT 26.5*  PLT 457*   BMET:  Recent Labs  09/04/16 1439  NA 133*  K 3.8  CL 104  CO2 24  GLUCOSE 112*  BUN 9  CREATININE 0.94  CALCIUM 8.0*    PT/INR: No results for input(s): LABPROT, INR in the last 72 hours. ABG No results found for: PHART, HCO3, TCO2, ACIDBASEDEF, O2SAT CBG (last 3)  No results for input(s): GLUCAP in the last 72 hours.  Meds Scheduled Meds: . methadone  5 mg Oral Q12H   Continuous Infusions: . ceFEPime (MAXIPIME) IV Stopped (09/05/16 0606)  . vancomycin Stopped (09/05/16 0429)   PRN Meds:.ketorolac, ondansetron **OR** ondansetron (ZOFRAN) IV, oxyCODONE, sodium chloride flush, zolpidem  Xrays No results found.   Results for orders placed or performed during the hospital encounter of 08/31/16  Culture, blood (Routine X 2) w Reflex to ID Panel      Status: None (Preliminary result)   Collection Time: 08/31/16  2:55 PM  Result Value Ref Range Status   Specimen Description LEFT ANTECUBITAL  Final   Special Requests   Final    BOTTLES DRAWN AEROBIC AND ANAEROBIC Blood Culture adequate volume   Culture NO GROWTH 4 DAYS  Final   Report Status PENDING  Incomplete  Culture, blood (Routine X 2) w Reflex to ID Panel     Status: None (Preliminary result)   Collection Time: 08/31/16  3:04 PM  Result Value Ref Range Status   Specimen Description RIGHT ANTECUBITAL  Final   Special Requests   Final    BOTTLES DRAWN AEROBIC AND ANAEROBIC Blood Culture adequate volume   Culture NO GROWTH 4 DAYS  Final   Report Status PENDING  Incomplete  MRSA PCR Screening     Status: None   Collection Time: 08/31/16 10:26 PM  Result Value Ref Range Status   MRSA by PCR NEGATIVE NEGATIVE Final    Comment:        The GeneXpert MRSA Assay (FDA approved for NASAL specimens only), is one component of a comprehensive MRSA colonization surveillance program. It is not intended to diagnose MRSA infection nor to guide or monitor treatment for MRSA infections.   Aerobic/Anaerobic Culture (surgical/deep wound)     Status: None (Preliminary result)   Collection Time: 09/02/16  8:31 AM  Result Value Ref Range Status   Specimen  Description ABSCESS  Final   Special Requests RIGHT STERNOCLAVICULAR POF VANC AND CEFEPINE  Final   Gram Stain   Final    FEW WBC PRESENT, PREDOMINANTLY PMN FEW GRAM POSITIVE COCCI RARE GRAM VARIABLE ROD    Culture   Final    MODERATE METHICILLIN RESISTANT STAPHYLOCOCCUS AUREUS   Report Status PENDING  Incomplete   Organism ID, Bacteria METHICILLIN RESISTANT STAPHYLOCOCCUS AUREUS  Final      Susceptibility   Methicillin resistant staphylococcus aureus - MIC*    CIPROFLOXACIN >=8 RESISTANT Resistant     ERYTHROMYCIN >=8 RESISTANT Resistant     GENTAMICIN <=0.5 SENSITIVE Sensitive     OXACILLIN >=4 RESISTANT Resistant     TETRACYCLINE  <=1 SENSITIVE Sensitive     VANCOMYCIN 1 SENSITIVE Sensitive     TRIMETH/SULFA <=10 SENSITIVE Sensitive     CLINDAMYCIN >=8 RESISTANT Resistant     RIFAMPIN <=0.5 SENSITIVE Sensitive     Inducible Clindamycin NEGATIVE Sensitive     * MODERATE METHICILLIN RESISTANT STAPHYLOCOCCUS AUREUS  Fungus Culture With Stain     Status: None (Preliminary result)   Collection Time: 09/02/16  8:31 AM  Result Value Ref Range Status   Fungus Stain Final report  Final    Comment: (NOTE) Performed At: [facility] [place], [place] [id number] Julie Romp MD UU:[id number]    Fungus (Mycology) Culture PENDING  Incomplete   Fungal Source ABSCESS  Final    Comment: RIGHT STERNOCLAVICULAR POF VANC AND CEFEPINE  Fungus Culture Result     Status: None   Collection Time: 09/02/16  8:31 AM  Result Value Ref Range Status   Result 1 Comment  Final    Comment: (NOTE) KOH/Calcofluor preparation:  no fungus observed. Performed At: [facility] [place], [place] [id number] Julie Romp MD YI:[id number]   Aerobic/Anaerobic Culture (surgical/deep wound)     Status: None (Preliminary result)   Collection Time: 09/02/16  8:46 AM  Result Value Ref Range Status   Specimen Description TISSUE  Final   Special Requests RIGHT STERNOCLAVICULAR TIS POF VANC AND CEFEPINE  Final   Gram Stain   Final    FEW WBC PRESENT, PREDOMINANTLY PMN NO ORGANISMS SEEN    Culture   Final    FEW METHICILLIN RESISTANT STAPHYLOCOCCUS AUREUS CRITICAL RESULT CALLED TO, READ BACK BY AND VERIFIED WITH: F. PASEDA RN, AT 1235 09/03/16 BY D.VANHOOK REGARDING CULTURE GROWTH CULTURE REINCUBATED FOR BETTER GROWTH    Report Status PENDING  Incomplete   Organism ID, Bacteria METHICILLIN RESISTANT STAPHYLOCOCCUS AUREUS  Final      Susceptibility   Methicillin resistant staphylococcus aureus - MIC*    CIPROFLOXACIN >=8 RESISTANT Resistant     ERYTHROMYCIN >=8 RESISTANT Resistant     GENTAMICIN  <=0.5 SENSITIVE Sensitive     OXACILLIN >=4 RESISTANT Resistant     TETRACYCLINE <=1 SENSITIVE Sensitive     VANCOMYCIN 1 SENSITIVE Sensitive     TRIMETH/SULFA <=10 SENSITIVE Sensitive     CLINDAMYCIN >=8 RESISTANT Resistant     RIFAMPIN <=0.5 SENSITIVE Sensitive     Inducible Clindamycin NEGATIVE Sensitive     * FEW METHICILLIN RESISTANT STAPHYLOCOCCUS AUREUS  Fungus Culture With Stain     Status: None (Preliminary result)   Collection Time: 09/02/16  8:46 AM  Result Value Ref Range Status   Fungus Stain Final report  Final    Comment: (NOTE) Performed At: [facility] [address] [place], [place] [id number] Julie Romp  MD JK:[id number]    Fungus (Mycology) Culture PENDING  Incomplete   Fungal Source TISSUE  Final    Comment: RIGHT STERNOCLAVIVULAR POF VANC AND CEFEPINE  Fungus Culture Result     Status: None   Collection Time: 09/02/16  8:46 AM  Result Value Ref Range Status   Result 1 Comment  Final    Comment: (NOTE) KOH/Calcofluor preparation:  no fungus observed. Performed At: [facility] [place], [place] [id number] Julie Romp MD YB:[id number]     Assessment/Plan: S/P Procedure(s) (LRB): IRRIGATION AND DEBRIDEMENT OF RIGHT STERNOCLAVICULAR JOINT, STERNUM AND RIBS (Right)  1 stable on current rx/tx 2 bradycardia- relatively asymptomatic with good BP- methadone appears to be the only neg chronotrope - defer to medicine to stop 3 MRSA- conts vanco, other abx as per medicine service  LOS: 5 days    [name],[name] 09/05/2016  for debridement Tuesday Vac functioning  I have seen and examined [name] and agree with the above assessment  and plan.  Julie Isaac MD Beeper [phone] Office [phone] 09/05/2016 10:30 AM

## 2016-09-06 MED ORDER — CHLORHEXIDINE GLUCONATE CLOTH 2 % EX PADS
6.0000 | MEDICATED_PAD | Freq: Once | CUTANEOUS | Status: AC
  Administered 2016-09-06: 6 via TOPICAL

## 2016-09-06 MED ORDER — CHLORHEXIDINE GLUCONATE CLOTH 2 % EX PADS
6.0000 | MEDICATED_PAD | Freq: Once | CUTANEOUS | Status: AC
  Administered 2016-09-07: 6 via TOPICAL

## 2016-09-06 NOTE — Progress Notes (Signed)
PROGRESS NOTE    [name]  SHF:[id number] DOB: [birth year] DOA: 08/31/2016 PCP: Patient, [name]    Brief Narrative:  46 y.o. female with medical history significant of IVDA, septic joint, septic emboli, endocardititis with recent long hospitalization in dec of 2017 for absess to chest wall with septic joint and infected valve comes in with 3 days of developing mass/absess to the same area to right chest wall with redness that has spread down to right breast with associated pain with breathing and moving right shoulder.    Assessment & Plan:   Principal Problem:   Septic arthritis of right sternoclavicular joint (HCC)/ Abscess of chest ([facility]) - Stop methadone given bradycardia - Cardiothoracic surgeon consulted and pt is s/p: Incision and debridement of right sternoclavicular joint, sternal head of clavicle, head of first rib and first costoclavicular joint, application of wound vac. Plan is for further debridement on tuesday  Bradycardia - discontinued methadone - asymptomatic with normal blood pressures  Active Problems:   Polysubstance abuse - Stable currently    IV drug abuse   Hepatitis C infection    COPD (chronic obstructive pulmonary disease) (HCC) - stable currently.    Endocarditis of tricuspid valve   DVT prophylaxis: SCD Code Status: Full Family Communication: none at bedside Disposition Plan: discussed with treatment team. Patient may represent difficult placement secondary to her history and her needs.   Consultants:   Cardiothoracic surgeon   Procedures: None   Antimicrobials: Cefepime, vancomycin   Subjective: No new problems reported today.  Objective: Vitals:   09/05/16 0538 09/05/16 1443 09/05/16 2004 09/06/16 0600  BP: 136/70 133/68 (!) 99/56 (!) 114/59  Pulse: (!) 50 (!) 52 (!) 58 (!) 55  Resp: 18 18 18 18   Temp: 98.2 F (36.8 C) 98.3 F (36.8 C) 98.2 F (36.8 C) 98.3 F (36.8 C)  TempSrc: Oral Oral Oral Oral  SpO2: 98% 99%  99% 98%  Weight:      Height:        Intake/Output Summary (Last 24 hours) at 09/06/16 1246 Last data filed at 09/06/16 5027  Gross per 24 hour  Intake             1177 ml  Output             1820 ml  Net             -643 ml   Filed Weights   08/31/16 1213 08/31/16 2222  Weight: 72.6 kg (160 lb) 74.6 kg (164 lb 6.4 oz)    Examination:  General exam: uncomfortably but in NAD Respiratory system: Clear to auscultation. Respiratory effort normal. Cardiovascular system: S1 & S2 heard, RRR. No JVD, murmurs, rubs, gallops or clicks. No pedal edema. Gastrointestinal system: Abdomen is nondistended, soft and nontender. No organomegaly or masses felt. Normal bowel sounds heard. Central nervous system: Alert and oriented. No focal neurological deficits. Extremities: Symmetric 5 x 5 power. Skin:  wound vac in place at chest Psychiatry: Mood & affect appropriate.   Data Reviewed: I have personally reviewed following labs and imaging studies  CBC:  Recent Labs Lab 08/31/16 1438 09/01/16 0230 09/03/16 0216  WBC 13.4* 13.3* 10.2  NEUTROABS 8.0*  --   --   HGB 8.3* 8.4* 8.4*  HCT 25.4* 26.3* 26.5*  MCV 75.4* 74.9* 75.1*  PLT 287 323 741*   Basic Metabolic Panel:  Recent Labs Lab 08/31/16 1438 09/01/16 0230 09/04/16 1439  NA 132* 136 133*  K 3.3*  3.7 3.8  CL 98* 105 104  CO2 25 24 24   GLUCOSE 139* 111* 112*  BUN 7 <5* 9  CREATININE 0.72 0.76 0.94  CALCIUM 8.1* 8.2* 8.0*   GFR: Estimated Creatinine Clearance: 70.5 mL/min (by C-G formula based on SCr of 0.94 mg/dL). Liver Function Tests:  Recent Labs Lab 08/31/16 1438  AST 17  ALT 9*  ALKPHOS 51  BILITOT 0.5  PROT 7.4  ALBUMIN 2.5*   No results for input(s): LIPASE, AMYLASE in the last 168 hours. No results for input(s): AMMONIA in the last 168 hours. Coagulation Profile: No results for input(s): INR, PROTIME in the last 168 hours. Cardiac Enzymes: No results for input(s): CKTOTAL, CKMB, CKMBINDEX, TROPONINI  in the last 168 hours. BNP (last 3 results) No results for input(s): PROBNP in the last 8760 hours. HbA1C: No results for input(s): HGBA1C in the last 72 hours. CBG: No results for input(s): GLUCAP in the last 168 hours. Lipid Profile: No results for input(s): CHOL, HDL, LDLCALC, TRIG, CHOLHDL, LDLDIRECT in the last 72 hours. Thyroid Function Tests: No results for input(s): TSH, T4TOTAL, FREET4, T3FREE, THYROIDAB in the last 72 hours. Anemia Panel: No results for input(s): VITAMINB12, FOLATE, FERRITIN, TIBC, IRON, RETICCTPCT in the last 72 hours. Sepsis Labs:  Recent Labs Lab 08/31/16 1457  LATICACIDVEN 1.87    Recent Results (from the past 240 hour(s))  Culture, blood (Routine X 2) w Reflex to ID Panel     Status: None   Collection Time: 08/31/16  2:55 PM  Result Value Ref Range Status   Specimen Description LEFT ANTECUBITAL  Final   Special Requests   Final    BOTTLES DRAWN AEROBIC AND ANAEROBIC Blood Culture adequate volume   Culture NO GROWTH 5 DAYS  Final   Report Status 09/05/2016 FINAL  Final  Culture, blood (Routine X 2) w Reflex to ID Panel     Status: None   Collection Time: 08/31/16  3:04 PM  Result Value Ref Range Status   Specimen Description RIGHT ANTECUBITAL  Final   Special Requests   Final    BOTTLES DRAWN AEROBIC AND ANAEROBIC Blood Culture adequate volume   Culture NO GROWTH 5 DAYS  Final   Report Status 09/05/2016 FINAL  Final  MRSA PCR Screening     Status: None   Collection Time: 08/31/16 10:26 PM  Result Value Ref Range Status   MRSA by PCR NEGATIVE NEGATIVE Final    Comment:        The GeneXpert MRSA Assay (FDA approved for NASAL specimens only), is one component of a comprehensive MRSA colonization surveillance program. It is not intended to diagnose MRSA infection nor to guide or monitor treatment for MRSA infections.   Aerobic/Anaerobic Culture (surgical/deep wound)     Status: None (Preliminary result)   Collection Time: 09/02/16  8:31  AM  Result Value Ref Range Status   Specimen Description ABSCESS  Final   Special Requests RIGHT STERNOCLAVICULAR POF VANC AND CEFEPINE  Final   Gram Stain   Final    FEW WBC PRESENT, PREDOMINANTLY PMN FEW GRAM POSITIVE COCCI RARE GRAM VARIABLE ROD    Culture   Final    MODERATE METHICILLIN RESISTANT STAPHYLOCOCCUS AUREUS   Report Status PENDING  Incomplete   Organism ID, Bacteria METHICILLIN RESISTANT STAPHYLOCOCCUS AUREUS  Final      Susceptibility   Methicillin resistant staphylococcus aureus - MIC*    CIPROFLOXACIN >=8 RESISTANT Resistant     ERYTHROMYCIN >=8 RESISTANT Resistant  GENTAMICIN <=0.5 SENSITIVE Sensitive     OXACILLIN >=4 RESISTANT Resistant     TETRACYCLINE <=1 SENSITIVE Sensitive     VANCOMYCIN 1 SENSITIVE Sensitive     TRIMETH/SULFA <=10 SENSITIVE Sensitive     CLINDAMYCIN >=8 RESISTANT Resistant     RIFAMPIN <=0.5 SENSITIVE Sensitive     Inducible Clindamycin NEGATIVE Sensitive     * MODERATE METHICILLIN RESISTANT STAPHYLOCOCCUS AUREUS  Fungus Culture With Stain     Status: None (Preliminary result)   Collection Time: 09/02/16  8:31 AM  Result Value Ref Range Status   Fungus Stain Final report  Final    Comment: (NOTE) Performed At: [facility] [place], [place] [id number] Lindon Romp MD GE:[id number]    Fungus (Mycology) Culture PENDING  Incomplete   Fungal Source ABSCESS  Final    Comment: RIGHT STERNOCLAVICULAR POF VANC AND CEFEPINE  Fungus Culture Result     Status: None   Collection Time: 09/02/16  8:31 AM  Result Value Ref Range Status   Result 1 Comment  Final    Comment: (NOTE) KOH/Calcofluor preparation:  no fungus observed. Performed At: [facility] [place], [place] [id number] Lindon Romp MD UY:[id number]   Aerobic/Anaerobic Culture (surgical/deep wound)     Status: None (Preliminary result)   Collection Time: 09/02/16  8:46 AM  Result Value Ref Range Status   Specimen  Description TISSUE  Final   Special Requests RIGHT STERNOCLAVICULAR TIS POF VANC AND CEFEPINE  Final   Gram Stain   Final    FEW WBC PRESENT, PREDOMINANTLY PMN NO ORGANISMS SEEN    Culture   Final    FEW METHICILLIN RESISTANT STAPHYLOCOCCUS AUREUS RARE STAPHYLOCOCCUS SPECIES (COAGULASE NEGATIVE) CRITICAL RESULT CALLED TO, READ BACK BY AND VERIFIED WITH: F. PASEDA RN, AT 1235 09/03/16 BY D.VANHOOK REGARDING CULTURE GROWTH    Report Status PENDING  Incomplete   Organism ID, Bacteria METHICILLIN RESISTANT STAPHYLOCOCCUS AUREUS  Final   Organism ID, Bacteria STAPHYLOCOCCUS SPECIES (COAGULASE NEGATIVE)  Final      Susceptibility   Methicillin resistant staphylococcus aureus - MIC*    CIPROFLOXACIN >=8 RESISTANT Resistant     ERYTHROMYCIN >=8 RESISTANT Resistant     GENTAMICIN <=0.5 SENSITIVE Sensitive     OXACILLIN >=4 RESISTANT Resistant     TETRACYCLINE <=1 SENSITIVE Sensitive     VANCOMYCIN 1 SENSITIVE Sensitive     TRIMETH/SULFA <=10 SENSITIVE Sensitive     CLINDAMYCIN >=8 RESISTANT Resistant     RIFAMPIN <=0.5 SENSITIVE Sensitive     Inducible Clindamycin NEGATIVE Sensitive     * FEW METHICILLIN RESISTANT STAPHYLOCOCCUS AUREUS   Staphylococcus species (coagulase negative) - MIC*    CIPROFLOXACIN 4 RESISTANT Resistant     ERYTHROMYCIN >=8 RESISTANT Resistant     GENTAMICIN <=0.5 SENSITIVE Sensitive     OXACILLIN >=4 RESISTANT Resistant     TETRACYCLINE >=16 RESISTANT Resistant     VANCOMYCIN 2 SENSITIVE Sensitive     TRIMETH/SULFA >=320 RESISTANT Resistant     CLINDAMYCIN <=0.25 SENSITIVE Sensitive     RIFAMPIN <=0.5 SENSITIVE Sensitive     Inducible Clindamycin NEGATIVE Sensitive     * RARE STAPHYLOCOCCUS SPECIES (COAGULASE NEGATIVE)  Fungus Culture With Stain     Status: None (Preliminary result)   Collection Time: 09/02/16  8:46 AM  Result Value Ref Range Status   Fungus Stain Final report  Final    Comment: (NOTE) Performed At: [facility] [address]  [place], [place] [id number]  Lindon Romp MD ZC:[id number]    Fungus (Mycology) Culture PENDING  Incomplete   Fungal Source TISSUE  Final    Comment: RIGHT STERNOCLAVIVULAR POF VANC AND CEFEPINE  Fungus Culture Result     Status: None   Collection Time: 09/02/16  8:46 AM  Result Value Ref Range Status   Result 1 Comment  Final    Comment: (NOTE) KOH/Calcofluor preparation:  no fungus observed. Performed At: [facility] [address] [place], [place] [id number] Lindon Romp MD ZB:[id number]      Radiology Studies: No results found.  Scheduled Meds: . Chlorhexidine Gluconate Cloth  6 each Topical Once   And  . Chlorhexidine Gluconate Cloth  6 each Topical Once   Continuous Infusions: . ceFEPime (MAXIPIME) IV Stopped (09/06/16 3685)  . vancomycin Stopped (09/06/16 0518)     LOS: 6 days    Time spent: > 35 minutes  Velvet Bathe, MD [facility] Pager [phone]  If 7PM-7AM, please contact night-coverage [email or web address] Password [facility] 09/06/2016, 12:46 PM

## 2016-09-06 NOTE — Consult Note (Signed)
[place] Nurse wound consult note Reason for Consult: Here to change NPWT (VAC) dressing.  Patient states that she does not need dressing change today.  She is going to the OR tomorrow for debridement and MD told her it would not be changed today.  Lauren, bedside RN informed and current dressing intact with an excellent seal.  Wound type: Surgical wound To OR tomorrow and will continue to follow and remain available to patient, medical and nursing teams.    Domenic Moras RN BSN [place] Pager [phone]

## 2016-09-06 NOTE — Progress Notes (Addendum)
      [address]       [place] [address]             [phone]      4 Days Post-Op Procedure(s) (LRB): IRRIGATION AND DEBRIDEMENT OF RIGHT STERNOCLAVICULAR JOINT, STERNUM AND RIBS (Right) Subjective: Feels about the same  Objective: Vital signs in last 24 hours: Temp:  [98.2 F (36.8 C)-98.3 F (36.8 C)] 98.3 F (36.8 C) (05/28 0600) Pulse Rate:  [52-58] 55 (05/28 0600) Cardiac Rhythm: Sinus bradycardia (05/28 0723) Resp:  [18] 18 (05/28 0600) BP: (99-133)/(56-68) 114/59 (05/28 0600) SpO2:  [98 %-99 %] 98 % (05/28 0600)  Hemodynamic parameters for last 24 hours:    Intake/Output from previous day: 05/27 0701 - 05/28 0700 In: 1177 [P.O.:827; IV Piggyback:350] Out: 6203 [Urine:1800; Drains:45] Intake/Output this shift: No intake/output data recorded.  General appearance: alert, cooperative and no distress Heart: regular rate and rhythm and soft systolic murmur Lungs: min dim in left base Wound: vac in place, serosang drainage, no cellulitis  Lab Results: No results for input(s): WBC, HGB, HCT, PLT in the last 72 hours. BMET:  Recent Labs  09/04/16 1439  NA 133*  K 3.8  CL 104  CO2 24  GLUCOSE 112*  BUN 9  CREATININE 0.94  CALCIUM 8.0*    PT/INR: No results for input(s): LABPROT, INR in the last 72 hours. ABG No results found for: PHART, HCO3, TCO2, ACIDBASEDEF, O2SAT CBG (last 3)  No results for input(s): GLUCAP in the last 72 hours.  Meds Scheduled Meds: Continuous Infusions: . ceFEPime (MAXIPIME) IV Stopped (09/06/16 5597)  . vancomycin Stopped (09/06/16 0518)   PRN Meds:.ketorolac, ondansetron **OR** ondansetron (ZOFRAN) IV, oxyCODONE, sodium chloride flush, zolpidem  Xrays No results found.  Assessment/Plan: S/P Procedure(s) (LRB): IRRIGATION AND DEBRIDEMENT OF RIGHT STERNOCLAVICULAR JOINT, STERNUM AND RIBS (Right)   1 stable overall 2 bradycardia- methadone has been stopped 3 plan for OR tomorrow by plastics 4  medical management per primary   LOS: 6 days    GOLD,WAYNE E 09/06/2016 Patient on or schedule for tomorrow by plastic  I have seen and examined [name] and agree with the above assessment  and plan.  Grace Isaac MD Beeper [phone] Office [phone] 09/06/2016 9:12 AM

## 2016-09-07 ENCOUNTER — Inpatient Hospital Stay (HOSPITAL_COMMUNITY): Payer: Medicare Other | Admitting: Certified Registered"

## 2016-09-07 ENCOUNTER — Encounter (HOSPITAL_COMMUNITY): Payer: Self-pay | Admitting: General Practice

## 2016-09-07 ENCOUNTER — Encounter (HOSPITAL_COMMUNITY)
Admission: EM | Discharge status: Against Medical Advice | Payer: Self-pay | Source: Home / Self Care | Attending: Family Medicine

## 2016-09-07 HISTORY — PX: APPLICATION OF A-CELL OF CHEST/ABDOMEN: SHX6302

## 2016-09-07 HISTORY — PX: INCISION AND DRAINAGE OF WOUND: SHX1803

## 2016-09-07 LAB — AEROBIC/ANAEROBIC CULTURE W GRAM STAIN (SURGICAL/DEEP WOUND)

## 2016-09-07 LAB — BASIC METABOLIC PANEL
Anion gap: 6 (ref 5–15)
BUN: 16 mg/dL (ref 6–20)
CO2: 27 mmol/L (ref 22–32)
Calcium: 8.4 mg/dL — ABNORMAL LOW (ref 8.9–10.3)
Chloride: 102 mmol/L (ref 101–111)
Creatinine, Ser: 0.74 mg/dL (ref 0.44–1.00)
GFR calc Af Amer: >60 mL/min (ref >60)
GFR calc non Af Amer: >60 mL/min (ref >60)
Glucose, Bld: 99 mg/dL (ref 65–99)
Potassium: 4.2 mmol/L (ref 3.5–5.1)
Sodium: 135 mmol/L (ref 135–145)

## 2016-09-07 LAB — AEROBIC/ANAEROBIC CULTURE (SURGICAL/DEEP WOUND)

## 2016-09-07 SURGERY — IRRIGATION AND DEBRIDEMENT WOUND
Anesthesia: General | Site: Chest | Laterality: Right

## 2016-09-07 MED ORDER — LACTATED RINGERS IV SOLN
INTRAVENOUS | Status: DC
  Administered 2016-09-07: 14:00:00 via INTRAVENOUS

## 2016-09-07 MED ORDER — LACTATED RINGERS IV SOLN
INTRAVENOUS | Status: DC | PRN
  Administered 2016-09-07: 14:00:00 via INTRAVENOUS

## 2016-09-07 MED ORDER — FENTANYL CITRATE (PF) 100 MCG/2ML IJ SOLN
INTRAMUSCULAR | Status: DC | PRN
  Administered 2016-09-07 (×2): 50 ug via INTRAVENOUS
  Administered 2016-09-07: 100 ug via INTRAVENOUS
  Administered 2016-09-07: 50 ug via INTRAVENOUS

## 2016-09-07 MED ORDER — FENTANYL CITRATE (PF) 100 MCG/2ML IJ SOLN
INTRAMUSCULAR | Status: AC
  Administered 2016-09-07: 50 ug via INTRAVENOUS
  Filled 2016-09-07: qty 2

## 2016-09-07 MED ORDER — LIDOCAINE-EPINEPHRINE 1 %-1:100000 IJ SOLN
INTRAMUSCULAR | Status: AC
  Filled 2016-09-07: qty 1

## 2016-09-07 MED ORDER — EPHEDRINE SULFATE-NACL 50-0.9 MG/10ML-% IV SOSY
PREFILLED_SYRINGE | INTRAVENOUS | Status: DC | PRN
  Administered 2016-09-07: 10 mg via INTRAVENOUS

## 2016-09-07 MED ORDER — SODIUM CHLORIDE 0.9 % IR SOLN
Status: DC | PRN
  Administered 2016-09-07: 500 mL

## 2016-09-07 MED ORDER — FENTANYL CITRATE (PF) 100 MCG/2ML IJ SOLN
25.0000 ug | INTRAMUSCULAR | Status: DC | PRN
  Administered 2016-09-07 (×2): 50 ug via INTRAVENOUS

## 2016-09-07 MED ORDER — BACITRACIN ZINC 500 UNIT/GM EX OINT
TOPICAL_OINTMENT | CUTANEOUS | Status: AC
  Filled 2016-09-07: qty 28.35

## 2016-09-07 MED ORDER — MIDAZOLAM HCL 5 MG/5ML IJ SOLN
INTRAMUSCULAR | Status: DC | PRN
  Administered 2016-09-07: 2 mg via INTRAVENOUS

## 2016-09-07 MED ORDER — MIDAZOLAM HCL 2 MG/2ML IJ SOLN
INTRAMUSCULAR | Status: AC
  Filled 2016-09-07: qty 2

## 2016-09-07 MED ORDER — PROPOFOL 10 MG/ML IV BOLUS
INTRAVENOUS | Status: DC | PRN
  Administered 2016-09-07: 150 mg via INTRAVENOUS

## 2016-09-07 MED ORDER — PROPOFOL 10 MG/ML IV BOLUS
INTRAVENOUS | Status: AC
  Filled 2016-09-07: qty 20

## 2016-09-07 MED ORDER — 0.9 % SODIUM CHLORIDE (POUR BTL) OPTIME
TOPICAL | Status: DC | PRN
  Administered 2016-09-07: 1000 mL

## 2016-09-07 MED ORDER — LIDOCAINE 2% (20 MG/ML) 5 ML SYRINGE
INTRAMUSCULAR | Status: DC | PRN
  Administered 2016-09-07: 60 mg via INTRAVENOUS

## 2016-09-07 MED ORDER — BUPIVACAINE HCL (PF) 0.25 % IJ SOLN
INTRAMUSCULAR | Status: AC
  Filled 2016-09-07: qty 30

## 2016-09-07 MED ORDER — PROMETHAZINE HCL 25 MG/ML IJ SOLN
6.2500 mg | INTRAMUSCULAR | Status: DC | PRN
  Administered 2016-09-07: 12.5 mg via INTRAVENOUS

## 2016-09-07 MED ORDER — PROMETHAZINE HCL 25 MG/ML IJ SOLN
INTRAMUSCULAR | Status: AC
  Administered 2016-09-07: 12.5 mg via INTRAVENOUS
  Filled 2016-09-07: qty 1

## 2016-09-07 MED ORDER — FENTANYL CITRATE (PF) 250 MCG/5ML IJ SOLN
INTRAMUSCULAR | Status: AC
  Filled 2016-09-07: qty 5

## 2016-09-07 MED ORDER — ONDANSETRON HCL 4 MG/2ML IJ SOLN
INTRAMUSCULAR | Status: DC | PRN
  Administered 2016-09-07: 4 mg via INTRAVENOUS

## 2016-09-07 SURGICAL SUPPLY — 65 items
APL SKNCLS STERI-STRIP NONHPOA (GAUZE/BANDAGES/DRESSINGS) ×1
APPLICATOR COTTON TIP 6IN STRL (MISCELLANEOUS) IMPLANT
BAG DECANTER FOR FLEXI CONT (MISCELLANEOUS) ×2 IMPLANT
BENZOIN TINCTURE PRP APPL 2/3 (GAUZE/BANDAGES/DRESSINGS) ×2 IMPLANT
BLADE 10 SAFETY STRL DISP (BLADE) ×2 IMPLANT
CANISTER SUCT 3000ML PPV (MISCELLANEOUS) ×2 IMPLANT
CANISTER WOUND CARE 500ML ATS (WOUND CARE) ×2 IMPLANT
CONT SPEC 4OZ CLIKSEAL STRL BL (MISCELLANEOUS) ×2 IMPLANT
COVER SURGICAL LIGHT HANDLE (MISCELLANEOUS) ×2 IMPLANT
DRAPE HALF SHEET 40X57 (DRAPES) IMPLANT
DRAPE IMP U-DRAPE 54X76 (DRAPES) ×2 IMPLANT
DRAPE INCISE IOBAN 66X45 STRL (DRAPES) ×2 IMPLANT
DRAPE LAPAROSCOPIC ABDOMINAL (DRAPES) ×2 IMPLANT
DRAPE LAPAROTOMY 100X72 PEDS (DRAPES) ×2 IMPLANT
DRESSING HYDROCOLLOID 4X4 (GAUZE/BANDAGES/DRESSINGS) ×2 IMPLANT
DRSG ADAPTIC 3X8 NADH LF (GAUZE/BANDAGES/DRESSINGS) IMPLANT
DRSG CUTIMED SORBACT 7X9 (GAUZE/BANDAGES/DRESSINGS) ×2 IMPLANT
DRSG EMULSION OIL 3X3 NADH (GAUZE/BANDAGES/DRESSINGS) IMPLANT
DRSG PAD ABDOMINAL 8X10 ST (GAUZE/BANDAGES/DRESSINGS) IMPLANT
DRSG VAC ATS LRG SENSATRAC (GAUZE/BANDAGES/DRESSINGS) IMPLANT
DRSG VAC ATS MED SENSATRAC (GAUZE/BANDAGES/DRESSINGS) IMPLANT
DRSG VAC ATS SM SENSATRAC (GAUZE/BANDAGES/DRESSINGS) ×2 IMPLANT
ELECT CAUTERY BLADE 6.4 (BLADE) ×2 IMPLANT
ELECT REM PT RETURN 9FT ADLT (ELECTROSURGICAL) ×2
ELECTRODE REM PT RTRN 9FT ADLT (ELECTROSURGICAL) ×1 IMPLANT
GAUZE SPONGE 4X4 12PLY STRL (GAUZE/BANDAGES/DRESSINGS) IMPLANT
GAUZE SPONGE 4X4 16PLY XRAY LF (GAUZE/BANDAGES/DRESSINGS) ×2 IMPLANT
GLOVE BIO SURGEON STRL SZ 6.5 (GLOVE) ×4 IMPLANT
GLOVE BIOGEL PI IND STRL 6.5 (GLOVE) ×1 IMPLANT
GLOVE BIOGEL PI INDICATOR 6.5 (GLOVE) ×1
GLOVE SURG SS PI 6.5 STRL IVOR (GLOVE) ×2 IMPLANT
GOWN STRL REUS W/ TWL LRG LVL3 (GOWN DISPOSABLE) ×3 IMPLANT
GOWN STRL REUS W/TWL LRG LVL3 (GOWN DISPOSABLE) ×3
KIT BASIN OR (CUSTOM PROCEDURE TRAY) ×2 IMPLANT
KIT ROOM TURNOVER OR (KITS) ×2 IMPLANT
MATRIX WOUND 3-LAYER 7X10 (Tissue) ×2 IMPLANT
MICROMATRIX 1000MG (Tissue) ×4 IMPLANT
NEEDLE HYPO 25GX1X1/2 BEV (NEEDLE) ×2 IMPLANT
NS IRRIG 1000ML POUR BTL (IV SOLUTION) ×2 IMPLANT
PACK GENERAL/GYN (CUSTOM PROCEDURE TRAY) ×2 IMPLANT
PACK SURGICAL SETUP 50X90 (CUSTOM PROCEDURE TRAY) ×2 IMPLANT
PACK UNIVERSAL I (CUSTOM PROCEDURE TRAY) ×2 IMPLANT
PAD ARMBOARD 7.5X6 YLW CONV (MISCELLANEOUS) ×4 IMPLANT
PENCIL BUTTON HOLSTER BLD 10FT (ELECTRODE) ×2 IMPLANT
SOL PREP POV-IOD 4OZ 10% (MISCELLANEOUS) ×2 IMPLANT
SOLUTION PARTIC MCRMTRX 1000MG (Tissue) ×2 IMPLANT
STAPLER VISISTAT 35W (STAPLE) ×2 IMPLANT
SURGILUBE 2OZ TUBE FLIPTOP (MISCELLANEOUS) IMPLANT
SUT CHROMIC 4 0 P 3 18 (SUTURE) IMPLANT
SUT ETHILON 4 0 PS 2 18 (SUTURE) IMPLANT
SUT ETHILON 5 0 P 3 18 (SUTURE)
SUT MNCRL AB 4-0 PS2 18 (SUTURE) ×2 IMPLANT
SUT NYLON ETHILON 5-0 P-3 1X18 (SUTURE) IMPLANT
SUT SILK 2 0 (SUTURE)
SUT SILK 2 0 SH (SUTURE) ×2 IMPLANT
SUT SILK 2-0 18XBRD TIE 12 (SUTURE) IMPLANT
SUT VIC AB 5-0 PS2 18 (SUTURE) IMPLANT
SUT VIC AB 5-0 TF 27 (SUTURE) ×2 IMPLANT
SWAB COLLECTION DEVICE MRSA (MISCELLANEOUS) IMPLANT
SWAB CULTURE ESWAB REG 1ML (MISCELLANEOUS) IMPLANT
SYR BULB 3OZ (MISCELLANEOUS) IMPLANT
SYR CONTROL 10ML LL (SYRINGE) ×2 IMPLANT
TOWEL OR 17X24 6PK STRL BLUE (TOWEL DISPOSABLE) ×2 IMPLANT
TOWEL OR 17X26 10 PK STRL BLUE (TOWEL DISPOSABLE) ×2 IMPLANT
UNDERPAD 30X30 (UNDERPADS AND DIAPERS) ×2 IMPLANT

## 2016-09-07 NOTE — Progress Notes (Addendum)
      [address]       [place] [address]             [phone]        5 Days Post-Op Procedure(s) (LRB): IRRIGATION AND DEBRIDEMENT OF RIGHT STERNOCLAVICULAR JOINT, STERNUM AND RIBS (Right)  Subjective: Patient without complaints this am.  Objective: Vital signs in last 24 hours: Temp:  [98.1 F (36.7 C)-98.6 F (37 C)] 98.1 F (36.7 C) (05/29 0539) Pulse Rate:  [53-64] 53 (05/29 0539) Cardiac Rhythm: Normal sinus rhythm (05/28 2026) Resp:  [18] 18 (05/29 0539) BP: (106-151)/(59-87) 106/59 (05/29 0539) SpO2:  [97 %-100 %] 97 % (05/29 0539)   Current Weight  08/31/16 74.6 kg (164 lb 6.4 oz)      Intake/Output from previous day: 05/28 0701 - 05/29 0700 In: 2090 [P.O.:1740; IV Piggyback:350] Out: 6101 [Urine:6100; Stool:1]   Physical Exam:  Cardiovascular: Slightly bradycardic Pulmonary: Clear to auscultation bilaterally Wound: VAC in place right SCV joint  Lab Results: CBC:No results for input(s): WBC, HGB, HCT, PLT in the last 72 hours. BMET:  Recent Labs  09/04/16 1439 09/07/16 0412  NA 133* 135  K 3.8 4.2  CL 104 102  CO2 24 27  GLUCOSE 112* 99  BUN 9 16  CREATININE 0.94 0.74  CALCIUM 8.0* 8.4*    PT/INR:  Lab Results  Component Value Date   INR 1.00 03/18/2016   INR 1.19 03/17/2016   INR 1.04 02/22/2016   ABG:  INR: Will add last result for INR, ABG once components are confirmed Will add last 4 CBG results once components are confirmed  Assessment/Plan:  1. CV - HR remains in the 40's. 2.ID-on Cefepime and Vancomycin for MRSA right SCV joint abcess 3. To OR today for debridement right SCV joint wound, A cell, and VAC placement with Dr. Marla Roe today.   ZIMMERMAN,DONIELLE MPA-C 09/07/2016,7:48 AM   Chart reviewed, patient examined, agree with above. Will see what plastic surgery thinks about the wound. I would agree that the least invasive treatment is best for this patient. She is likely to be  non-compliant, have poor wound healing and return to drug use.

## 2016-09-07 NOTE — Interval H&P Note (Signed)
History and Physical Interval Note:  09/07/2016 2:19 PM  [name]  has presented today for surgery, with the diagnosis of open wound of chest  The various methods of treatment have been discussed with the patient and family. After consideration of risks, benefits and other options for treatment, the patient has consented to  Procedure(s): IRRIGATION AND DEBRIDEMENT OF CHEST WOUND, PLACEMENT OF A CELL AND VAC (Right) APPLICATION OF A-CELL OF CHEST/ABDOMEN (Right) as a surgical intervention .  The patient's history has been reviewed, patient examined, no change in status, stable for surgery.  I have reviewed the patient's chart and labs.  Questions were answered to the patient's satisfaction.     Wallace Going

## 2016-09-07 NOTE — Progress Notes (Signed)
Pharmacy Antibiotic Note [name] is a 46 y.o. female admitted on 08/31/2016 with recurrent abscess/septic joint. 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 Currently on day #8 of vancomycin and cefepime. Plans for OR today for debridement   Plan: -Continue vancomycin 1250 mg IV q12h -Continue cefepime 2 g IV q8h -Monitor renal function, cultures, duration of therapy -Will recheck a vancomycin trough later this week   Height: 5\' 1"  (154.9 cm) Weight: 164 lb 6.4 oz (74.6 kg) IBW/kg (Calculated) : 47.8  Temp (24hrs), Avg:98.3 F (36.8 C), Min:98.1 F (36.7 C), Max:98.6 F (37 C)   Recent Labs Lab 08/31/16 1438 08/31/16 1457 09/01/16 0230 09/03/16 0216 09/04/16 1438 09/04/16 1439 09/07/16 0412  WBC 13.4*  --  13.3* 10.2  --   --   --   CREATININE 0.72  --  0.76  --   --  0.94 0.74  LATICACIDVEN  --  1.87  --   --   --   --   --   VANCOTROUGH  --   --   --   --  12*  --   --     Estimated Creatinine Clearance: 82.9 mL/min (by C-G formula based on SCr of 0.74 mg/dL).    Allergies  Allergen Reactions  . Mango Flavor Hives and Swelling  . Doxycycline Other (See Comments)    "BURNS" >> ? INCREASED SENSITIVITY TO SUNLIGHT ?  . Pyridium [Phenazopyridine Hcl] Other (See Comments)    Irritation to skin  . Tramadol Nausea And Vomiting    Hildred Laser, Pharm D 09/07/2016 10:16 AM

## 2016-09-07 NOTE — Anesthesia Procedure Notes (Signed)
Procedure Name: LMA Insertion Date/Time: 09/07/2016 2:44 PM Performed by: Teressa Lower Pre-anesthesia Checklist: Patient identified, Emergency Drugs available, Suction available, Patient being monitored and Timeout performed Patient Re-evaluated:Patient Re-evaluated prior to inductionOxygen Delivery Method: Circle system utilized Preoxygenation: Pre-oxygenation with 100% oxygen Intubation Type: IV induction Ventilation: Mask ventilation without difficulty LMA: LMA inserted LMA Size: 4.0 Number of attempts: 1 Dental Injury: Teeth and Oropharynx as per pre-operative assessment

## 2016-09-07 NOTE — Anesthesia Postprocedure Evaluation (Signed)
Anesthesia Post Note  Patient: JASIA HILTUNEN  Procedure(s) Performed: Procedure(s) (LRB): IRRIGATION AND DEBRIDEMENT OF CHEST WOUND, PLACEMENT OF A CELL AND VAC (Right) APPLICATION OF A-CELL OF CHEST/ABDOMEN (Right)  Patient location during evaluation: PACU Anesthesia Type: General Level of consciousness: awake and alert Pain management: pain level controlled Vital Signs Assessment: post-procedure vital signs reviewed and stable Respiratory status: spontaneous breathing, nonlabored ventilation, respiratory function stable and patient connected to nasal cannula oxygen Cardiovascular status: blood pressure returned to baseline and stable Postop Assessment: no signs of nausea or vomiting Anesthetic complications: no       Last Vitals:  Vitals:   09/07/16 1553 09/07/16 1657  BP: (!) 85/64 (!) 106/45  Pulse:  (!) 54  Resp: 14 20  Temp:      Last Pain:  Vitals:   09/07/16 1538  TempSrc:   PainSc: 10-Worst pain ever                 Tiajuana Amass

## 2016-09-07 NOTE — H&P (View-Only) (Signed)
This RN notified Hillary Bow, MD about the results of the culture growth, will continue to monitor.

## 2016-09-07 NOTE — Anesthesia Preprocedure Evaluation (Signed)
Anesthesia Evaluation  Patient identified by MRN, date of birth, ID band Patient awake    Reviewed: Allergy & Precautions, NPO status , Patient's Chart, lab work & pertinent test results  Airway Mallampati: II  TM Distance: >3 FB Neck ROM: Full    Dental  (+) Teeth Intact, Poor Dentition, Dental Advisory Given   Pulmonary COPD, Current Smoker,    breath sounds clear to auscultation       Cardiovascular  Rhythm:Regular Rate:Normal   Aortic valve: No AV vegetation.  Mitral valve: No leaflet thickening and calcification present.  Right ventricle: Normal cavity size, wall thickness and ejection fraction.  Septum: No Patent Foramen Ovale present.  Left atrium: Patent foramen ovale not present.  Pulmonic valve: Trace regurgitation.  Left ventricle: The LV diameter was at the upper limits of normal and measured 5.3 cm at end-diastole at the mid-papillary level in the transgastric short-axis view. There was normal LV systolic function, the ejection fraction was estimated at 55-60% using the Simpson's method in the 4 and 2 chamber views. There were no regional wall motion abnormalities.  Left atrium: No spontaneous echo contrast.   Neuro/Psych Anxiety Depression negative neurological ROS     GI/Hepatic negative GI ROS, (+) Hepatitis -, C  Endo/Other  negative endocrine ROS  Renal/GU negative Renal ROS     Musculoskeletal  (+) Arthritis ,   Abdominal   Peds  Hematology  (+) anemia ,   Anesthesia Other Findings   Reproductive/Obstetrics                             Lab Results  Component Value Date   WBC 10.2 09/03/2016   HGB 8.4 (L) 09/03/2016   HCT 26.5 (L) 09/03/2016   MCV 75.1 (L) 09/03/2016   PLT 457 (H) 09/03/2016   Lab Results  Component Value Date   CREATININE 0.74 09/07/2016   BUN 16 09/07/2016   NA 135 09/07/2016   K 4.2 09/07/2016   CL 102 09/07/2016   CO2 27 09/07/2016     Anesthesia Physical  Anesthesia Plan  ASA: III  Anesthesia Plan: General   Post-op Pain Management:    Induction: Intravenous  Airway Management Planned: LMA  Additional Equipment:   Intra-op Plan:   Post-operative Plan: Extubation in OR  Informed Consent: I have reviewed the patients History and Physical, chart, labs and discussed the procedure including the risks, benefits and alternatives for the proposed anesthesia with the patient or authorized representative who has indicated his/her understanding and acceptance.   Dental advisory given  Plan Discussed with: CRNA and Anesthesiologist  Anesthesia Plan Comments:         Anesthesia Quick Evaluation

## 2016-09-07 NOTE — Transfer of Care (Signed)
Immediate Anesthesia Transfer of Care Note  Patient: [name]  Procedure(s) Performed: Procedure(s): IRRIGATION AND DEBRIDEMENT OF CHEST WOUND, PLACEMENT OF A CELL AND VAC (Right) APPLICATION OF A-CELL OF CHEST/ABDOMEN (Right)  Patient Location: PACU  Anesthesia Type:General  Level of Consciousness: awake, alert  and oriented  Airway & Oxygen Therapy: Patient Spontanous Breathing and Patient connected to nasal cannula oxygen  Post-op Assessment: Report given to RN and Post -op Vital signs reviewed and stable  Post vital signs: Reviewed and stable  Last Vitals:  Vitals:   09/07/16 0539 09/07/16 1524  BP: (!) 106/59 120/70  Pulse: (!) 53 61  Resp: 18 15  Temp: 36.7 C 36.6 C    Last Pain:  Vitals:   09/07/16 1000  TempSrc:   PainSc: 0-No pain         Complications: No apparent anesthesia complications

## 2016-09-07 NOTE — Consult Note (Signed)
WOC follow-up: Plastics team plans to take the patient to the OR today; please refer to their team for further questions regarding plan of care. Please re-consult if further assistance is needed.  Thank-you,  Julien Girt MSN, [place], [place], [place], [place]

## 2016-09-07 NOTE — Progress Notes (Signed)
PROGRESS NOTE    [name]  CBJ:[id number] DOB: [birth year] DOA: 08/31/2016 PCP: Patient, [name]    Brief Narrative:  46 y.o. female with medical history significant of IVDA, septic joint, septic emboli, endocardititis with recent long hospitalization in dec of 2017 for absess to chest wall with septic joint and infected valve comes in with 3 days of developing mass/absess to the same area to right chest wall with redness that has spread down to right breast with associated pain with breathing and moving right shoulder.   Pt is s/p: Incision and debridement of right sternoclavicular joint, sternal head of clavicle, head of first rib and first costoclavicular joint, application of wound vac.   Assessment & Plan:   Principal Problem:   Septic arthritis of right sternoclavicular joint (HCC)/ Abscess of chest ([place]) - Stop methadone given bradycardia - Cardiothoracic surgeon consulted and pt is s/p: Incision and debridement of right sternoclavicular joint, sternal head of clavicle, head of first rib and first costoclavicular joint, application of wound vac. Plan is for further debridement on 09/07/16  Bradycardia - discontinued methadone - asymptomatic with normal blood pressures  Active Problems:   Polysubstance abuse - Recommended cessation    IV drug abuse   Hepatitis C infection    COPD (chronic obstructive pulmonary disease) (HCC) - stable currently.   DVT prophylaxis: SCD Code Status: Full Family Communication: none at bedside Disposition Plan: discussed with treatment team. Patient may represent difficult placement secondary to her history and her needs.   Consultants:   Cardiothoracic surgeon   Procedures: None   Antimicrobials: Cefepime, vancomycin   Subjective: No new problems reported today.  Objective: Vitals:   09/06/16 0600 09/06/16 1403 09/06/16 2058 09/07/16 0539  BP: (!) 114/59 124/87 (!) 151/72 (!) 106/59  Pulse: (!) 55 64 (!) 56 (!) 53    Resp: 18 18 18 18   Temp: 98.3 F (36.8 C) 98.6 F (37 C) 98.1 F (36.7 C) 98.1 F (36.7 C)  TempSrc: Oral Oral Oral Oral  SpO2: 98% 100% 100% 97%  Weight:      Height:        Intake/Output Summary (Last 24 hours) at 09/07/16 1416 Last data filed at 09/07/16 1000  Gross per 24 hour  Intake             1250 ml  Output             5250 ml  Net            -4000 ml   Filed Weights   08/31/16 1213 08/31/16 2222  Weight: 72.6 kg (160 lb) 74.6 kg (164 lb 6.4 oz)    Examination:  General exam: uncomfortably but in NAD Respiratory system: Clear to auscultation. Respiratory effort normal. Cardiovascular system: S1 & S2 heard, RRR. No JVD, murmurs, rubs, gallops or clicks. No pedal edema. Gastrointestinal system: Abdomen is nondistended, soft and nontender. No organomegaly or masses felt. Normal bowel sounds heard. Central nervous system: Alert and oriented. No focal neurological deficits. Extremities: Symmetric 5 x 5 power. Skin:  wound vac in place at chest Psychiatry: Mood & affect appropriate.   Data Reviewed: I have personally reviewed following labs and imaging studies  CBC:  Recent Labs Lab 08/31/16 1438 09/01/16 0230 09/03/16 0216  WBC 13.4* 13.3* 10.2  NEUTROABS 8.0*  --   --   HGB 8.3* 8.4* 8.4*  HCT 25.4* 26.3* 26.5*  MCV 75.4* 74.9* 75.1*  PLT 287 323 457*   Basic  Metabolic Panel:  Recent Labs Lab 08/31/16 1438 09/01/16 0230 09/04/16 1439 09/07/16 0412  NA 132* 136 133* 135  K 3.3* 3.7 3.8 4.2  CL 98* 105 104 102  CO2 25 24 24 27   GLUCOSE 139* 111* 112* 99  BUN 7 <5* 9 16  CREATININE 0.72 0.76 0.94 0.74  CALCIUM 8.1* 8.2* 8.0* 8.4*   GFR: Estimated Creatinine Clearance: 82.9 mL/min (by C-G formula based on SCr of 0.74 mg/dL). Liver Function Tests:  Recent Labs Lab 08/31/16 1438  AST 17  ALT 9*  ALKPHOS 51  BILITOT 0.5  PROT 7.4  ALBUMIN 2.5*   No results for input(s): LIPASE, AMYLASE in the last 168 hours. No results for input(s):  AMMONIA in the last 168 hours. Coagulation Profile: No results for input(s): INR, PROTIME in the last 168 hours. Cardiac Enzymes: No results for input(s): CKTOTAL, CKMB, CKMBINDEX, TROPONINI in the last 168 hours. BNP (last 3 results) No results for input(s): PROBNP in the last 8760 hours. HbA1C: No results for input(s): HGBA1C in the last 72 hours. CBG: No results for input(s): GLUCAP in the last 168 hours. Lipid Profile: No results for input(s): CHOL, HDL, LDLCALC, TRIG, CHOLHDL, LDLDIRECT in the last 72 hours. Thyroid Function Tests: No results for input(s): TSH, T4TOTAL, FREET4, T3FREE, THYROIDAB in the last 72 hours. Anemia Panel: No results for input(s): VITAMINB12, FOLATE, FERRITIN, TIBC, IRON, RETICCTPCT in the last 72 hours. Sepsis Labs:  Recent Labs Lab 08/31/16 1457  LATICACIDVEN 1.87    Recent Results (from the past 240 hour(s))  Culture, blood (Routine X 2) w Reflex to ID Panel     Status: None   Collection Time: 08/31/16  2:55 PM  Result Value Ref Range Status   Specimen Description LEFT ANTECUBITAL  Final   Special Requests   Final    BOTTLES DRAWN AEROBIC AND ANAEROBIC Blood Culture adequate volume   Culture NO GROWTH 5 DAYS  Final   Report Status 09/05/2016 FINAL  Final  Culture, blood (Routine X 2) w Reflex to ID Panel     Status: None   Collection Time: 08/31/16  3:04 PM  Result Value Ref Range Status   Specimen Description RIGHT ANTECUBITAL  Final   Special Requests   Final    BOTTLES DRAWN AEROBIC AND ANAEROBIC Blood Culture adequate volume   Culture NO GROWTH 5 DAYS  Final   Report Status 09/05/2016 FINAL  Final  MRSA PCR Screening     Status: None   Collection Time: 08/31/16 10:26 PM  Result Value Ref Range Status   MRSA by PCR NEGATIVE NEGATIVE Final    Comment:        The GeneXpert MRSA Assay (FDA approved for NASAL specimens only), is one component of a comprehensive MRSA colonization surveillance program. It is not intended to diagnose  MRSA infection nor to guide or monitor treatment for MRSA infections.   Aerobic/Anaerobic Culture (surgical/deep wound)     Status: None   Collection Time: 09/02/16  8:31 AM  Result Value Ref Range Status   Specimen Description ABSCESS  Final   Special Requests RIGHT STERNOCLAVICULAR POF VANC AND CEFEPINE  Final   Gram Stain   Final    FEW WBC PRESENT, PREDOMINANTLY PMN FEW GRAM POSITIVE COCCI RARE GRAM VARIABLE ROD    Culture   Final    MODERATE METHICILLIN RESISTANT STAPHYLOCOCCUS AUREUS NO ANAEROBES ISOLATED    Report Status 09/07/2016 FINAL  Final   Organism ID, Bacteria METHICILLIN RESISTANT STAPHYLOCOCCUS AUREUS  Final      Susceptibility   Methicillin resistant staphylococcus aureus - MIC*    CIPROFLOXACIN >=8 RESISTANT Resistant     ERYTHROMYCIN >=8 RESISTANT Resistant     GENTAMICIN <=0.5 SENSITIVE Sensitive     OXACILLIN >=4 RESISTANT Resistant     TETRACYCLINE <=1 SENSITIVE Sensitive     VANCOMYCIN 1 SENSITIVE Sensitive     TRIMETH/SULFA <=10 SENSITIVE Sensitive     CLINDAMYCIN >=8 RESISTANT Resistant     RIFAMPIN <=0.5 SENSITIVE Sensitive     Inducible Clindamycin NEGATIVE Sensitive     * MODERATE METHICILLIN RESISTANT STAPHYLOCOCCUS AUREUS  Fungus Culture With Stain     Status: None (Preliminary result)   Collection Time: 09/02/16  8:31 AM  Result Value Ref Range Status   Fungus Stain Final report  Final    Comment: (NOTE) Performed At: [facility] [place], [place] [id number] Lindon Romp MD FU:[id number]    Fungus (Mycology) Culture PENDING  Incomplete   Fungal Source ABSCESS  Final    Comment: RIGHT STERNOCLAVICULAR POF VANC AND CEFEPINE  Fungus Culture Result     Status: None   Collection Time: 09/02/16  8:31 AM  Result Value Ref Range Status   Result 1 Comment  Final    Comment: (NOTE) KOH/Calcofluor preparation:  no fungus observed. Performed At: [facility] [place], [place] [id number] Lindon Romp MD BJ:[id number]   Aerobic/Anaerobic Culture (surgical/deep wound)     Status: None   Collection Time: 09/02/16  8:46 AM  Result Value Ref Range Status   Specimen Description TISSUE  Final   Special Requests RIGHT STERNOCLAVICULAR TIS POF VANC AND CEFEPINE  Final   Gram Stain   Final    FEW WBC PRESENT, PREDOMINANTLY PMN NO ORGANISMS SEEN    Culture   Final    FEW METHICILLIN RESISTANT STAPHYLOCOCCUS AUREUS RARE STAPHYLOCOCCUS SPECIES (COAGULASE NEGATIVE) CRITICAL RESULT CALLED TO, READ BACK BY AND VERIFIED WITH: F. PASEDA RN, AT 1235 09/03/16 BY D.VANHOOK REGARDING CULTURE GROWTH NO ANAEROBES ISOLATED    Report Status 09/07/2016 FINAL  Final   Organism ID, Bacteria METHICILLIN RESISTANT STAPHYLOCOCCUS AUREUS  Final   Organism ID, Bacteria STAPHYLOCOCCUS SPECIES (COAGULASE NEGATIVE)  Final      Susceptibility   Methicillin resistant staphylococcus aureus - MIC*    CIPROFLOXACIN >=8 RESISTANT Resistant     ERYTHROMYCIN >=8 RESISTANT Resistant     GENTAMICIN <=0.5 SENSITIVE Sensitive     OXACILLIN >=4 RESISTANT Resistant     TETRACYCLINE <=1 SENSITIVE Sensitive     VANCOMYCIN 1 SENSITIVE Sensitive     TRIMETH/SULFA <=10 SENSITIVE Sensitive     CLINDAMYCIN >=8 RESISTANT Resistant     RIFAMPIN <=0.5 SENSITIVE Sensitive     Inducible Clindamycin NEGATIVE Sensitive     * FEW METHICILLIN RESISTANT STAPHYLOCOCCUS AUREUS   Staphylococcus species (coagulase negative) - MIC*    CIPROFLOXACIN 4 RESISTANT Resistant     ERYTHROMYCIN >=8 RESISTANT Resistant     GENTAMICIN <=0.5 SENSITIVE Sensitive     OXACILLIN >=4 RESISTANT Resistant     TETRACYCLINE >=16 RESISTANT Resistant     VANCOMYCIN 2 SENSITIVE Sensitive     TRIMETH/SULFA >=320 RESISTANT Resistant     CLINDAMYCIN <=0.25 SENSITIVE Sensitive     RIFAMPIN <=0.5 SENSITIVE Sensitive     Inducible Clindamycin NEGATIVE Sensitive     * RARE STAPHYLOCOCCUS SPECIES (COAGULASE NEGATIVE)  Fungus Culture With Stain     Status: None  (Preliminary result)  Collection Time: 09/02/16  8:46 AM  Result Value Ref Range Status   Fungus Stain Final report  Final    Comment: (NOTE) Performed At: [facility] [place], [place] [id number] Lindon Romp MD QM:[id number]    Fungus (Mycology) Culture PENDING  Incomplete   Fungal Source TISSUE  Final    Comment: RIGHT STERNOCLAVIVULAR POF VANC AND CEFEPINE  Fungus Culture Result     Status: None   Collection Time: 09/02/16  8:46 AM  Result Value Ref Range Status   Result 1 Comment  Final    Comment: (NOTE) KOH/Calcofluor preparation:  no fungus observed. Performed At: [facility] [address] [place], [place] [id number] Lindon Romp MD NU:[id number]      Radiology Studies: No results found.  Scheduled Meds:  Continuous Infusions: . [MAR Hold] ceFEPime (MAXIPIME) IV 2 g (09/07/16 0541)  . lactated ringers 50 mL/hr at 09/07/16 1407  . [MAR Hold] vancomycin 1,250 mg (09/07/16 0237)     LOS: 7 days    Time spent: > 35 minutes  Velvet Bathe, MD [facility] Pager [phone]  If 7PM-7AM, please contact night-coverage [email or web address] Password [email or web address] 09/07/2016, 2:16 PM

## 2016-09-07 NOTE — Op Note (Signed)
DATE OF OPERATION: 09/07/2016  LOCATION: [facility] Inpatient  PREOPERATIVE DIAGNOSIS: Right chest wound  POSTOPERATIVE DIAGNOSIS: Same  PROCEDURE: Excision of right chest wound 4 x 6 x 3 cm with placement of Acell (2 gm powder and 7 x 10 cm sheet) and placement of the VAC  SURGEON: Tylin Force Sanger Lorrinda Ramstad, DO  EBL: 20 cc  CONDITION: Stable  COMPLICATIONS: None  INDICATION: The patient, [name], is a 46 y.o. female born on 1970-08-28, is here for treatment of a right sternoclavicular joint infection.   PROCEDURE DETAILS:  The patient was seen prior to surgery and marked.  The IV antibiotics were given. The patient was taken to the operating room and given a general anesthetic. A standard time out was performed and all information was confirmed by those in the room. SCDs were placed.   The area was prepped and draped in the usual sterile fashion.  The area was irrigated with antibiotic solution and saline.  The #10 blade and curette were used to debride skin and subcutaneous tissue of the 4 x 6 cm wound.  Hemostasis was achieved with electrocautery.  All of the Acell powder and 5 x 8 of the sheet were applied.  The sheet was secured with 5-0 Vicryl.  The sorbact was applied and secured with 3-0 Silk.  Restore was placed around the area for skin protection.  The KY gel was applied with the VAC sponge.  There was an excellent seal.   The patient was allowed to wake up and taken to recovery room in stable condition at the end of the case. The family was notified at the end of the case.

## 2016-09-08 ENCOUNTER — Encounter (HOSPITAL_COMMUNITY): Payer: Self-pay | Admitting: Plastic Surgery

## 2016-09-08 DIAGNOSIS — S21101A Unspecified open wound of right front wall of thorax without penetration into thoracic cavity, initial encounter: Secondary | ICD-10-CM

## 2016-09-08 LAB — BASIC METABOLIC PANEL
Anion gap: 7 (ref 5–15)
BUN: 12 mg/dL (ref 6–20)
CO2: 26 mmol/L (ref 22–32)
Calcium: 8.5 mg/dL — ABNORMAL LOW (ref 8.9–10.3)
Chloride: 101 mmol/L (ref 101–111)
Creatinine, Ser: 0.79 mg/dL (ref 0.44–1.00)
GFR calc Af Amer: >60 mL/min (ref >60)
GFR calc non Af Amer: >60 mL/min (ref >60)
Glucose, Bld: 169 mg/dL — ABNORMAL HIGH (ref 65–99)
Potassium: 4.2 mmol/L (ref 3.5–5.1)
Sodium: 134 mmol/L — ABNORMAL LOW (ref 135–145)

## 2016-09-08 LAB — CBC
HCT: 30.1 % — ABNORMAL LOW (ref 36.0–46.0)
Hemoglobin: 9.4 g/dL — ABNORMAL LOW (ref 12.0–15.0)
MCH: 24.5 pg — ABNORMAL LOW (ref 26.0–34.0)
MCHC: 31.2 g/dL (ref 30.0–36.0)
MCV: 78.4 fL (ref 78.0–100.0)
Platelets: 520 10*3/uL — ABNORMAL HIGH (ref 150–400)
RBC: 3.84 MIL/uL — ABNORMAL LOW (ref 3.87–5.11)
RDW: 19.2 % — ABNORMAL HIGH (ref 11.5–15.5)
WBC: 8.3 10*3/uL (ref 4.0–10.5)

## 2016-09-08 LAB — VANCOMYCIN, TROUGH: Vancomycin Tr: 15 ug/mL (ref 15–20)

## 2016-09-08 NOTE — Progress Notes (Signed)
Continues Vanc and Cefepime, wound VAC to chest s/p Acell placement by plastics on 5/29. Hx and current  IVDU not a candidate for home PICC. Per MD note consulting ID. CM will continue to follow for abx recs at DC to help determine placement. Anticipate SNF for IV Abx and or VAC needs.

## 2016-09-08 NOTE — Progress Notes (Addendum)
      [address]       [place] [address]             [phone]        1 Day Post-Op Procedure(s) (LRB): IRRIGATION AND DEBRIDEMENT OF CHEST WOUND, PLACEMENT OF A CELL AND VAC (Right) APPLICATION OF A-CELL OF CHEST/ABDOMEN (Right)  Subjective: Patient without pain at wound VAC site.  Objective: Vital signs in last 24 hours: Temp:  [97.8 F (36.6 C)-98.7 F (37.1 C)] 97.8 F (36.6 C) (05/30 0615) Pulse Rate:  [54-76] 63 (05/30 0615) Cardiac Rhythm: Normal sinus rhythm (05/29 2030) Resp:  [14-20] 18 (05/30 0615) BP: (85-120)/(45-74) 91/47 (05/30 0615) SpO2:  [97 %-100 %] 97 % (05/30 0615)   Current Weight  08/31/16 74.6 kg (164 lb 6.4 oz)      Intake/Output from previous day: 05/29 0701 - 05/30 0700 In: 410 [I.V.:410] Out: 1510 [Urine:1500; Blood:10]   Physical Exam:  Cardiovascular: RRR Pulmonary: Clear to auscultation bilaterally Wound: VAC in place right SCV joint  Lab Results: CBC:No results for input(s): WBC, HGB, HCT, PLT in the last 72 hours. BMET:   Recent Labs  09/07/16 0412  NA 135  K 4.2  CL 102  CO2 27  GLUCOSE 99  BUN 16  CREATININE 0.74  CALCIUM 8.4*    PT/INR:  Lab Results  Component Value Date   INR 1.00 03/18/2016   INR 1.19 03/17/2016   INR 1.04 02/22/2016   ABG:  INR: Will add last result for INR, ABG once components are confirmed Will add last 4 CBG results once components are confirmed  Assessment/Plan:  1. CV - HR remains in the 60's. 2.ID-on Cefepime and Vancomycin for MRSA right SCV joint abcess 3. Right SCV joint abscess-s/p  debridement right SCV joint wound, A cell, and VAC placement by Dr. Marla Roe 4. Would recommend infectious disease evaluate to determine antibiotic and length of treatment.   ZIMMERMAN,DONIELLE MPA-C 09/08/2016,7:21 AM  Agree with above. She has osteomyelitis of the clavicle, first rib and manubrium. I have debrided these bones back to hard viable-appearing bone but  she is at high risk for non-healing and ongoing bone destruction that would require a much more extensive resection. I agree that she need a complete course of vancomycin.

## 2016-09-08 NOTE — Progress Notes (Signed)
PROGRESS NOTE    [name]  MGQ:[id number] DOB: [birth year] DOA: 08/31/2016 PCP: Patient, [name]   Chief Complaint  Patient presents with  . Abscess    Brief Narrative:  HPI on 08/31/2016 by Dr. Steward Ros SANTIAGO GRAF is a 46 y.o. female with medical history significant of IVDA, septic joint, septic emboli, endocardititis with recent long hospitalization in dec of 2017 for absess to chest wall with septic joint and infected valve comes in with 3 days of developing mass/absess to the same area to right chest wall with redness that has spread down to right breast with associated pain with breathing and moving right shoulder.  Pt reports when she left hospital she was sober until her mother died in 05/14/2022.  She was on sub axone in [place] but transportation became an issue, then her mom died and she has been using heroin since.  She uses at least every other day, is no longer on subaxone.   She does not use needles to her chest, only to her upper extremities.    Interim history S/p I&D of the right sternoclavicular joint by plastics.  Assessment & Plan   Right sternoclavicular joint abscess -Blood cultures from 08/31/2016 show no growth to date -Wound culture from 09/02/2016 showed MRSA and staph coag negative -Cardiothoracic and plastic surgery consulted appreciated -Status post excision of the right chest wound 4 x 6 x 3 cm with placement of Acell (2g powder and 7x10cm sheet), placement of VAC on 09/07/2016 by Dr. Marla Roe -Currently on vancomycin and cefepime -Will consult infectious disease for antibiotic assistance as well as duration -Of note, patient did have TEE on 09/02/2016 which showed no aortic vegetation and no mitral valve leaflet thickening or calcification, no PFO. EF 55-60%  Bradycardia -Resolved -Methadone discontinued -Currently asymptomatic  Polysubstance abuse -Discussed cessation -Tox screen positive for cocaine and opiates  Hepatitis C  infection -Will need outpatient follow-up  COPD -Currently stable, no wheezing on exam  Chronic anemia -Baseline hemoglobin between 8 and 9, currently 9.4 -Continue to monitor CBC  Thrombocytosis -Possibly reactive -Continue to monitor CBC  DVT Prophylaxis  SCDs  Code Status: Full  Family Communication: None at bedside  Disposition Plan: Admitted. Dispo TBD  Consultants Cardiothoracic surgery Plastic surgery Infectious disease  Procedures  TEE Eexcision of the right chest wound 4 x 6 x 3 cm with placement of Acell (2g powder and 7x10cm sheet), placement of VAC  Antibiotics   Anti-infectives    Start     Dose/Rate Route Frequency Ordered Stop   09/07/16 1454  polymyxin B 500,000 Units, bacitracin 50,000 Units in sodium chloride irrigation 0.9 % 500 mL irrigation  Status:  Discontinued       As needed 09/07/16 1454 09/07/16 1517   09/04/16 1530  vancomycin (VANCOCIN) 1,250 mg in sodium chloride 0.9 % 250 mL IVPB     1,250 mg 166.7 mL/hr over 90 Minutes Intravenous Every 12 hours 09/04/16 1514     09/01/16 1400  ceFEPIme (MAXIPIME) 2 g in dextrose 5 % 50 mL IVPB     2 g 100 mL/hr over 30 Minutes Intravenous Every 8 hours 09/01/16 1012     08/31/16 2300  vancomycin (VANCOCIN) IVPB 1000 mg/200 mL premix  Status:  Discontinued     1,000 mg 200 mL/hr over 60 Minutes Intravenous Every 12 hours 08/31/16 2221 09/04/16 1514   08/31/16 2300  ceFEPIme (MAXIPIME) 2 g in dextrose 5 % 50 mL IVPB  Status:  Discontinued     2 g 100 mL/hr over 30 Minutes Intravenous Every 12 hours 08/31/16 2221 09/01/16 1012   08/31/16 1445  vancomycin (VANCOCIN) IVPB 1000 mg/200 mL premix     1,000 mg 200 mL/hr over 60 Minutes Intravenous  Once 08/31/16 1438 08/31/16 1616      Subjective:   [name] seen and examined today.  Patient denies any current pain. Does feel some discomfort/soreness in her chest. Wonders when she will be able to go home. Currently denies any shortness of breath,  abdominal pain, nausea or vomiting, diarrhea or constipation.  Objective:   Vitals:   09/07/16 1657 09/07/16 2019 09/07/16 2055 09/08/16 0615  BP: (!) 106/45 (!) 91/53 (!) 112/56 (!) 91/47  Pulse: (!) 54 (!) 58 60 63  Resp: 20 18  18   Temp:  98.7 F (37.1 C)  97.8 F (36.6 C)  TempSrc:  Oral  Oral  SpO2: 100% 97%  97%  Weight:      Height:        Intake/Output Summary (Last 24 hours) at 09/08/16 1230 Last data filed at 09/08/16 0830  Gross per 24 hour  Intake              650 ml  Output             1510 ml  Net             -860 ml   Filed Weights   08/31/16 1213 08/31/16 2222  Weight: 72.6 kg (160 lb) 74.6 kg (164 lb 6.4 oz)    Exam  General: Well developed, well nourished, NAD, appears stated age  HEENT: NCAT,mucous membranes moist.   Cardiovascular: S1 S2 auscultated, no rubs, murmurs or gallops. Regular rate and rhythm.  Chest wall: vac in place  Respiratory: Clear to auscultation bilaterally with equal chest rise  Abdomen: Soft, nontender, nondistended, + bowel sounds  Extremities: warm dry without cyanosis clubbing or edema  Neuro: AAOx3, nonfocal  Psych: Normal affect and demeanor    Data Reviewed: I have personally reviewed following labs and imaging studies  CBC:  Recent Labs Lab 09/03/16 0216 09/08/16 0827  WBC 10.2 8.3  HGB 8.4* 9.4*  HCT 26.5* 30.1*  MCV 75.1* 78.4  PLT 457* 962*   Basic Metabolic Panel:  Recent Labs Lab 09/04/16 1439 09/07/16 0412 09/08/16 0827  NA 133* 135 134*  K 3.8 4.2 4.2  CL 104 102 101  CO2 24 27 26   GLUCOSE 112* 99 169*  BUN 9 16 12   CREATININE 0.94 0.74 0.79  CALCIUM 8.0* 8.4* 8.5*   GFR: Estimated Creatinine Clearance: 82.9 mL/min (by C-G formula based on SCr of 0.79 mg/dL). Liver Function Tests: No results for input(s): AST, ALT, ALKPHOS, BILITOT, PROT, ALBUMIN in the last 168 hours. No results for input(s): LIPASE, AMYLASE in the last 168 hours. No results for input(s): AMMONIA in the last  168 hours. Coagulation Profile: No results for input(s): INR, PROTIME in the last 168 hours. Cardiac Enzymes: No results for input(s): CKTOTAL, CKMB, CKMBINDEX, TROPONINI in the last 168 hours. BNP (last 3 results) No results for input(s): PROBNP in the last 8760 hours. HbA1C: No results for input(s): HGBA1C in the last 72 hours. CBG: No results for input(s): GLUCAP in the last 168 hours. Lipid Profile: No results for input(s): CHOL, HDL, LDLCALC, TRIG, CHOLHDL, LDLDIRECT in the last 72 hours. Thyroid Function Tests: No results for input(s): TSH, T4TOTAL, FREET4, T3FREE, THYROIDAB in the last 72 hours.  Anemia Panel: No results for input(s): VITAMINB12, FOLATE, FERRITIN, TIBC, IRON, RETICCTPCT in the last 72 hours. Urine analysis:    Component Value Date/Time   COLORURINE YELLOW 08/31/2016 2108   APPEARANCEUR CLEAR 08/31/2016 2108   LABSPEC 1.025 08/31/2016 2108   PHURINE 7.0 08/31/2016 2108   GLUCOSEU NEGATIVE 08/31/2016 2108   HGBUR SMALL (A) 08/31/2016 2108   BILIRUBINUR NEGATIVE 08/31/2016 2108   KETONESUR NEGATIVE 08/31/2016 2108   PROTEINUR NEGATIVE 08/31/2016 2108   UROBILINOGEN 0.2 10/18/2014 0100   NITRITE NEGATIVE 08/31/2016 2108   LEUKOCYTESUR SMALL (A) 08/31/2016 2108   Sepsis Labs: @LABRCNTIP (procalcitonin:4,lacticidven:4)  ) Recent Results (from the past 240 hour(s))  Culture, blood (Routine X 2) w Reflex to ID Panel     Status: None   Collection Time: 08/31/16  2:55 PM  Result Value Ref Range Status   Specimen Description LEFT ANTECUBITAL  Final   Special Requests   Final    BOTTLES DRAWN AEROBIC AND ANAEROBIC Blood Culture adequate volume   Culture NO GROWTH 5 DAYS  Final   Report Status 09/05/2016 FINAL  Final  Culture, blood (Routine X 2) w Reflex to ID Panel     Status: None   Collection Time: 08/31/16  3:04 PM  Result Value Ref Range Status   Specimen Description RIGHT ANTECUBITAL  Final   Special Requests   Final    BOTTLES DRAWN AEROBIC AND  ANAEROBIC Blood Culture adequate volume   Culture NO GROWTH 5 DAYS  Final   Report Status 09/05/2016 FINAL  Final  MRSA PCR Screening     Status: None   Collection Time: 08/31/16 10:26 PM  Result Value Ref Range Status   MRSA by PCR NEGATIVE NEGATIVE Final    Comment:        The GeneXpert MRSA Assay (FDA approved for NASAL specimens only), is one component of a comprehensive MRSA colonization surveillance program. It is not intended to diagnose MRSA infection nor to guide or monitor treatment for MRSA infections.   Aerobic/Anaerobic Culture (surgical/deep wound)     Status: None   Collection Time: 09/02/16  8:31 AM  Result Value Ref Range Status   Specimen Description ABSCESS  Final   Special Requests RIGHT STERNOCLAVICULAR POF VANC AND CEFEPINE  Final   Gram Stain   Final    FEW WBC PRESENT, PREDOMINANTLY PMN FEW GRAM POSITIVE COCCI RARE GRAM VARIABLE ROD    Culture   Final    MODERATE METHICILLIN RESISTANT STAPHYLOCOCCUS AUREUS NO ANAEROBES ISOLATED    Report Status 09/07/2016 FINAL  Final   Organism ID, Bacteria METHICILLIN RESISTANT STAPHYLOCOCCUS AUREUS  Final      Susceptibility   Methicillin resistant staphylococcus aureus - MIC*    CIPROFLOXACIN >=8 RESISTANT Resistant     ERYTHROMYCIN >=8 RESISTANT Resistant     GENTAMICIN <=0.5 SENSITIVE Sensitive     OXACILLIN >=4 RESISTANT Resistant     TETRACYCLINE <=1 SENSITIVE Sensitive     VANCOMYCIN 1 SENSITIVE Sensitive     TRIMETH/SULFA <=10 SENSITIVE Sensitive     CLINDAMYCIN >=8 RESISTANT Resistant     RIFAMPIN <=0.5 SENSITIVE Sensitive     Inducible Clindamycin NEGATIVE Sensitive     * MODERATE METHICILLIN RESISTANT STAPHYLOCOCCUS AUREUS  Fungus Culture With Stain     Status: None (Preliminary result)   Collection Time: 09/02/16  8:31 AM  Result Value Ref Range Status   Fungus Stain Final report  Final    Comment: (NOTE) Performed At: [facility] [employer] [address]  [place], [place] [id number] Lindon Romp MD [id number]    Fungus (Mycology) Culture PENDING  Incomplete   Fungal Source ABSCESS  Final    Comment: RIGHT STERNOCLAVICULAR POF VANC AND CEFEPINE  Fungus Culture Result     Status: None   Collection Time: 09/02/16  8:31 AM  Result Value Ref Range Status   Result 1 Comment  Final    Comment: (NOTE) KOH/Calcofluor preparation:  no fungus observed. Performed At: [facility] [place], [place] [id number] Lindon Romp MD [id number]   Aerobic/Anaerobic Culture (surgical/deep wound)     Status: None   Collection Time: 09/02/16  8:46 AM  Result Value Ref Range Status   Specimen Description TISSUE  Final   Special Requests RIGHT STERNOCLAVICULAR TIS POF VANC AND CEFEPINE  Final   Gram Stain   Final    FEW WBC PRESENT, PREDOMINANTLY PMN NO ORGANISMS SEEN    Culture   Final    FEW METHICILLIN RESISTANT STAPHYLOCOCCUS AUREUS RARE STAPHYLOCOCCUS SPECIES (COAGULASE NEGATIVE) CRITICAL RESULT CALLED TO, READ BACK BY AND VERIFIED WITH: F. PASEDA RN, AT 1235 09/03/16 BY D.VANHOOK REGARDING CULTURE GROWTH NO ANAEROBES ISOLATED    Report Status 09/07/2016 FINAL  Final   Organism ID, Bacteria METHICILLIN RESISTANT STAPHYLOCOCCUS AUREUS  Final   Organism ID, Bacteria STAPHYLOCOCCUS SPECIES (COAGULASE NEGATIVE)  Final      Susceptibility   Methicillin resistant staphylococcus aureus - MIC*    CIPROFLOXACIN >=8 RESISTANT Resistant     ERYTHROMYCIN >=8 RESISTANT Resistant     GENTAMICIN <=0.5 SENSITIVE Sensitive     OXACILLIN >=4 RESISTANT Resistant     TETRACYCLINE <=1 SENSITIVE Sensitive     VANCOMYCIN 1 SENSITIVE Sensitive     TRIMETH/SULFA <=10 SENSITIVE Sensitive     CLINDAMYCIN >=8 RESISTANT Resistant     RIFAMPIN <=0.5 SENSITIVE Sensitive     Inducible Clindamycin NEGATIVE Sensitive     * FEW METHICILLIN RESISTANT STAPHYLOCOCCUS AUREUS   Staphylococcus species (coagulase negative) - MIC*    CIPROFLOXACIN 4 RESISTANT Resistant      ERYTHROMYCIN >=8 RESISTANT Resistant     GENTAMICIN <=0.5 SENSITIVE Sensitive     OXACILLIN >=4 RESISTANT Resistant     TETRACYCLINE >=16 RESISTANT Resistant     VANCOMYCIN 2 SENSITIVE Sensitive     TRIMETH/SULFA >=320 RESISTANT Resistant     CLINDAMYCIN <=0.25 SENSITIVE Sensitive     RIFAMPIN <=0.5 SENSITIVE Sensitive     Inducible Clindamycin NEGATIVE Sensitive     * RARE STAPHYLOCOCCUS SPECIES (COAGULASE NEGATIVE)  Fungus Culture With Stain     Status: None (Preliminary result)   Collection Time: 09/02/16  8:46 AM  Result Value Ref Range Status   Fungus Stain Final report  Final    Comment: (NOTE) Performed At: [facility] [place], [place] [id number] Lindon Romp MD PF:[id number]    Fungus (Mycology) Culture PENDING  Incomplete   Fungal Source TISSUE  Final    Comment: RIGHT STERNOCLAVIVULAR POF VANC AND CEFEPINE  Fungus Culture Result     Status: None   Collection Time: 09/02/16  8:46 AM  Result Value Ref Range Status   Result 1 Comment  Final    Comment: (NOTE) KOH/Calcofluor preparation:  no fungus observed. Performed At: [facility] [address] [place], [place] [id number] Lindon Romp MD [id number]       Radiology Studies: No results found.   Scheduled Meds: Continuous Infusions: . ceFEPime (MAXIPIME) IV Stopped (09/08/16 0706)  . lactated  ringers 50 mL/hr at 09/07/16 1407  . vancomycin Stopped (09/08/16 0544)     LOS: 8 days   Time Spent in minutes   30 minutes  Saryiah Bencosme D.O. on 09/08/2016 at 12:30 PM  Between 7am to 7pm - Pager - [phone]  After 7pm go to [email or web address] - password [email or web address]  And look for the night coverage person covering for me after hours  [facility] Office  [phone]

## 2016-09-08 NOTE — Consult Note (Signed)
[facility]  Total days of antibiotics 9        Day 9 Cefepime        Day 9 Vancomycin        Reason for Consult: Antibiotic management for right SCV joint abscess  Referring Physician: Dr. Ree Kida, [facility]   HPI: [name] is a 46 y.o. woman with PMHx of IV drug use (heroin), hepatitis C infection, tricuspid valve endocarditis, and complicated hx of recurrent right sternoclavicular joint abscess who was admitted on 08/31/16 for redness, swelling, and tenderness of the right SCV joint.   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 She also had a wound vac placed. During that hospitalization she was found to have tricuspid valve endocarditis and [place] bacteremia with Pseudomonas wound cx. She was discharged to [facility] SNF for IV Cefepime treatment but then apparently left AMA. She returned to the ED on 03/17/16 with chest pain after not receiving antibiotics for 5 days and had her PICC line still in place. The PICC line was removed in the ED and a new one was not placed due to her high risk for infection and noncompliance. Blood cx grew MRSA and Corynebacterium. She was on IV Vancomycin and Cefepime while in the hospital and then discharged on Bactrim and Levaquin for a 2 month course. She was supposed to follow up with ID as an outpatient but never did.   She presented to [facility] ED on 08/31/16 with recurrent swelling/redness/tenderness of the right SCV joint. She reported continued IV drug abuse. CT chest revealed a large region (8-9 cm) of phlegmon and septic effusion at the right SCV joint. CVTS was consulted and patient underwent I&D with wound vac placement on 5/24. A TEE was done during that procedure which showed no evidence of heart valve vegetations. She was subsequently seen by plastic surgery and underwent excision of the right chest wound with placement of Acell and wound vac on  5/29. Her blood cx from 5/22 are negative. Her abscess cx from 5/24 grew MRSA and coagulase negative Staph. She has been on IV Vancomycin and Cefepime since admit.   She reports the swelling/redness/tenderness of her right chest have significantly improved. She reports some itching where the wound vac is located by otherwise has no discomfort. She reports she did well on Suboxone but then the person she car-pooled with relapsed so she had no way of making it to the clinic and relapsed herself. She denies injecting into her chest area, only her upper extremities.   Past Medical History:  Diagnosis Date  . Anxiety   . Chronic back pain   . Chronic knee pain   . COPD (chronic obstructive pulmonary disease) ([place])   . DDD (degenerative disc disease)   . Hepatitis C infection 07/13/2014  . IV drug abuse   . Nephrolithiasis   . Panic attacks   . Polysubstance abuse    cocaine, opiates, marijuana, amphetamines, "molly," crystal meth  . Pulmonary embolism ([facility])     Allergies:  Allergies  Allergen Reactions  . Mango Flavor Hives and Swelling  . Doxycycline Other (See Comments)    "BURNS" >> ? INCREASED SENSITIVITY TO SUNLIGHT ?  . Pyridium [Phenazopyridine Hcl] Other (See Comments)    Irritation to skin  . Tramadol Nausea And Vomiting    Current antibiotics: IV Vancomycin and IV Cefepime   MEDICATIONS:   Social History  Substance Use Topics  . Smoking status: Current Every Day Smoker    Packs/day: 1.00    Years: 32.00    Types: Cigarettes  . Smokeless tobacco: Never Used  . Alcohol use No    Family History  Problem Relation Age of Onset  . COPD Mother   . Hypertension Father   . Heart attack Father   . Diabetes Father   . Arthritis Unknown   . Lung disease Unknown   . Cancer Unknown   . Asthma Unknown     Review of Systems  All negative except per HPI   OBJECTIVE: Temp:  [97.8 F (36.6 C)-98.7 F (37.1 C)] 97.8 F (36.6 C) (05/30 0615) Pulse Rate:  [54-76] 63  (05/30 0615) Resp:  [14-20] 18 (05/30 0615) BP: (85-120)/(45-74) 91/47 (05/30 0615) SpO2:  [97 %-100 %] 97 % (05/30 0615)  General: Well-nourished woman in NAD, appears disheveled  HEENT: EOMI, sclera anicteric, mucus membranes moist CV: RRR, no m/g/r Pulm: CTA bilaterally, breaths non-labored Abd: BS+, soft, non-tender Ext: warm, no peripheral edema Neuro: alert and oriented x 3 Skin: Right-sided wound vac in place over right SCV joint. Surrounding skin does not appear erythematous and is non-tender.   LABS: Results for orders placed or performed during the hospital encounter of 08/31/16 (from the past 48 hour(s))  Basic metabolic panel     Status: Abnormal   Collection Time: 09/07/16  4:12 AM  Result Value Ref Range   Sodium 135 135 - 145 mmol/L   Potassium 4.2 3.5 - 5.1 mmol/L   Chloride 102 101 - 111 mmol/L   CO2 27 22 - 32 mmol/L   Glucose, Bld 99 65 - 99 mg/dL   BUN 16 6 - 20 mg/dL   Creatinine, Ser 0.74 0.44 - 1.00 mg/dL   Calcium 8.4 (L) 8.9 - 10.3 mg/dL   GFR calc non Af Amer >60 >60 mL/min   GFR calc Af Amer >60 >60 mL/min    Comment: (NOTE) The eGFR has been calculated using the CKD EPI equation. This calculation has not been validated in all clinical situations. eGFR's persistently <60 mL/min signify possible Chronic Kidney Disease.    Anion gap 6 5 - 15  CBC     Status: Abnormal   Collection Time: 09/08/16  8:27 AM  Result Value Ref Range   WBC 8.3 4.0 - 10.5 K/uL   RBC 3.84 (L) 3.87 - 5.11 MIL/uL   Hemoglobin 9.4 (L) 12.0 - 15.0 g/dL   HCT 30.1 (L) 36.0 - 46.0 %   MCV 78.4 78.0 - 100.0 fL   MCH 24.5 (L) 26.0 - 34.0 pg   MCHC 31.2 30.0 - 36.0 g/dL   RDW 19.2 (H) 11.5 - 15.5 %   Platelets 520 (H) 150 - 400 K/uL  Basic metabolic panel     Status: Abnormal   Collection Time: 09/08/16  8:27 AM  Result Value Ref Range   Sodium 134 (L) 135 - 145 mmol/L   Potassium 4.2 3.5 - 5.1 mmol/L   Chloride 101 101 - 111 mmol/L   CO2 26 22 - 32 mmol/L   Glucose, Bld  169 (H) 65 - 99 mg/dL   BUN 12 6 - 20 mg/dL   Creatinine, Ser 0.79 0.44 - 1.00 mg/dL   Calcium 8.5 (L) 8.9 - 10.3 mg/dL   GFR calc non Af Amer >60 >60 mL/min   GFR calc Af Amer >60 >60 mL/min    Comment: (NOTE) The eGFR has been calculated  using the CKD EPI equation. This calculation has not been validated in all clinical situations. eGFR's persistently <60 mL/min signify possible Chronic Kidney Disease.    Anion gap 7 5 - 15    MICRO: Blood cx 5/22: NGTD Wound cx 5/24: MRSA + coag neg staph  IMAGING: IMPRESSION: 1. Large region of phlegmon and septic effusion again noted at the right sternoclavicular and right first costomanubrial joints. New air within this collection likely reflects interval drainage of the superficial abscess. The phlegmon measures 8-9 cm, with underlying mild bony destruction and diffuse sclerosis again noted. Sclerosis involves the right clavicle, right first rib and right side of the sternum. Mild extension of phlegmon to the superior aspect of the mediastinum. 2. Mild right-sided airspace opacity may reflect atelectasis or possibly mild pneumonia, including mild opacity underlying the right anterior chest wall phlegmon. 3. Vague soft tissue inflammation tracking about the right breast, reflecting cellulitis. 4. Mild asymmetric prominence of right axillary nodes, without significant axillary lymphadenopathy. 5. Mild splenomegaly.   Assessment/Plan:   Ms. Cura is a 47yo woman with hx of hepatitis C and IV drug abuse here with recurrent right SCV joint infection.   Right Sternoclavicular Joint Abscess/Osteomyelitis: s/p I&D on 5/24 and s/p wound excision with Acell placement on 5/29. Wound vac currently in place. Blood cx are negative. Wound cx growing MRSA and coagulase negative Staph. She will require 8 weeks of IV antibiotics. This may be difficult with her hx of leaving facilities prematurely. We had a long discussion with the patient and she is  agreeable to staying at a SNF as she knows she requires this therapy to get better.  - Discontinue Cefepime - Continue Vancomycin, will need 8 weeks total - Needs SNF placement  - Would not repeat echo as already had intraop TEE which did not show evidence of endocarditis   Hepatitis C: Diagnosed in 2016 with positive Hep C antibody and HCV quant >4 million. Genotype tested in Dec 2017 was 1a.  - Will need outpatient ID follow up - Will check HIV given continued IV drug use - will consider Hep C treatment after 6 month sobriety from IVDU. She is 4 wk out from last IVDU per her report  Thank you for this interesting consult.  Albin Felling, MD, MPH Internal Medicine Resident, PGY-3    Date: 09/08/2016  Patient name: [name]  Medical record number: [id number]  Date of birth: January 14, 1971   I have seen and evaluated [name] and discussed their care with the Residency Team. This is a 46yo F with past hx of pseudomonal bacteremia and TV endocarditis with R [place] septic arthritis s/p I x D that was partially treated since she left AMA last winter then had MRSA bacteremia in December but treated with bactrim and levofloxacin after her hospitalziation. She now returns with right sided chest wall abscess pain s/p debridement found to have MRSA/CoNS septic arthritis. She is currently on vanco and cefepime. She had intra op echo which showed no vegetations  PMHx, Fam Hx, and/or Soc Hx : last smoked cigarettes 2 wk ago, and IVDU last used 4 wk ago  Vitals:   09/08/16 0615 09/08/16 1500  BP: (!) 91/47 (!) 101/59  Pulse: 63 (!) 58  Resp: 18 20  Temp: 97.8 F (36.6 C)    Physical Exam  Constitutional:  oriented to person, place, and time. appears well-developed and well-nourished. No distress.  HENT: [place]/AT, PERRLA, no scleral icterus Mouth/Throat: Oropharynx is clear and  moist. No oropharyngeal exudate. Poor dentition Cardiovascular: Normal rate, regular rhythm and normal heart sounds. Exam  reveals no gallop and no friction rub.  No murmur heard.  Chest wall = right sided sternal wound vac  Pulmonary/Chest: Effort normal and breath sounds normal. No respiratory distress.  has no wheezes.  Neck = supple, no nuchal rigidity Abdominal: Soft. Bowel sounds are normal.  exhibits no distension. There is no tenderness.  Lymphadenopathy: no cervical adenopathy. No axillary adenopathy Neurological: alert and oriented to person, place, and time.  Skin: Skin is warm and dry. Numerous scars to arms Psychiatric: a normal mood and affect.  behavior is normal.    Assessment and Plan: I have seen and evaluated the patient as outlined above. I agree with the formulated Assessment and Plan as detailed in the residents' note, with the following changes:   1. Continue to discuss with patient that the gold standard of treatment for mrsa osteo/septic arthritis is to try to have her have 8 wk of IV vancomycin through picc line 2. Illicit drug use = she is not using presently and plans to stop. Wants to get back to suboxone clinic 3. Will repeat hiv testing 4. Reinforced that she can't have partially treated infection and that it is very high risk for her to leave from SNF and needs to commit to staying to finish course of therapy  Carlyle Basques, MD 5/30/20185:11 PM

## 2016-09-08 NOTE — Progress Notes (Signed)
Pharmacy Antibiotic Note [name] is a 46 y.o. female admitted on 08/31/2016 with recurrent abscess/septic joint. 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 Intra-op TEE did not show evidence of endocarditis.   S/p debridement and placement of Acell and VAC on 5/29 by plastics.  A vancomycin trough this afternoon is therapeutic at 15 mcg/mL. SCr is stable at 0.79.   Plan: -Continue vancomycin 1250 mg IV q12h -Plan is for 8 weeks total of therapy -Monitor renal function and will recheck a vancomycin trough later this week   Height: 5\' 1"  (154.9 cm) Weight: 164 lb 6.4 oz (74.6 kg) IBW/kg (Calculated) : 47.8  Temp (24hrs), Avg:98.3 F (36.8 C), Min:97.8 F (36.6 C), Max:98.7 F (37.1 C)   Recent Labs Lab 09/03/16 0216 09/04/16 1438 09/04/16 1439 09/07/16 0412 09/08/16 0827 09/08/16 1542  WBC 10.2  --   --   --  8.3  --   CREATININE  --   --  0.94 0.74 0.79  --   VANCOTROUGH  --  12*  --   --   --  15    Estimated Creatinine Clearance: 82.9 mL/min (by C-G formula based on SCr of 0.79 mg/dL).    Allergies  Allergen Reactions  . Mango Flavor Hives and Swelling  . Doxycycline Other (See Comments)    "BURNS" >> ? INCREASED SENSITIVITY TO SUNLIGHT ?  . Pyridium [Phenazopyridine Hcl] Other (See Comments)    Irritation to skin  . Tramadol Nausea And Vomiting   Antimicrobials this admission: 5/22 vanc >>  5/22 cefepime >> 5/30  5/26 VT: 12 5/30 VT: 15  Microbiology results: 5/22 BCx: ngF 5/24 wound abscess: MRSA 5/24 fungal cx: ip 5/22 MRSA PCR: neg   Krishna Heuer D. Khalid Lacko, PharmD, BCPS Clinical Pharmacist Pager: [phone] 09/08/2016 5:19 PM

## 2016-09-09 DIAGNOSIS — D649 Anemia, unspecified: Secondary | ICD-10-CM

## 2016-09-09 DIAGNOSIS — D473 Essential (hemorrhagic) thrombocythemia: Secondary | ICD-10-CM

## 2016-09-09 LAB — CBC
HCT: 30.2 % — ABNORMAL LOW (ref 36.0–46.0)
Hemoglobin: 9.4 g/dL — ABNORMAL LOW (ref 12.0–15.0)
MCH: 24.4 pg — ABNORMAL LOW (ref 26.0–34.0)
MCHC: 31.1 g/dL (ref 30.0–36.0)
MCV: 78.4 fL (ref 78.0–100.0)
Platelets: 465 10*3/uL — ABNORMAL HIGH (ref 150–400)
RBC: 3.85 MIL/uL — ABNORMAL LOW (ref 3.87–5.11)
RDW: 19.1 % — ABNORMAL HIGH (ref 11.5–15.5)
WBC: 9.8 10*3/uL (ref 4.0–10.5)

## 2016-09-09 LAB — BASIC METABOLIC PANEL
Anion gap: 7 (ref 5–15)
BUN: 10 mg/dL (ref 6–20)
CO2: 27 mmol/L (ref 22–32)
Calcium: 8.8 mg/dL — ABNORMAL LOW (ref 8.9–10.3)
Chloride: 102 mmol/L (ref 101–111)
Creatinine, Ser: 0.71 mg/dL (ref 0.44–1.00)
GFR calc Af Amer: >60 mL/min (ref >60)
GFR calc non Af Amer: >60 mL/min (ref >60)
Glucose, Bld: 99 mg/dL (ref 65–99)
Potassium: 4.3 mmol/L (ref 3.5–5.1)
Sodium: 136 mmol/L (ref 135–145)

## 2016-09-09 LAB — HIV ANTIBODY (ROUTINE TESTING W REFLEX): HIV Screen 4th Generation wRfx: NONREACTIVE

## 2016-09-09 NOTE — Progress Notes (Signed)
      [address]       [place] [address]             [phone]      2 Days Post-Op Procedure(s) (LRB): IRRIGATION AND DEBRIDEMENT OF CHEST WOUND, PLACEMENT OF A CELL AND VAC (Right) APPLICATION OF A-CELL OF CHEST/ABDOMEN (Right)   Subjective:  No specific complaints.  Needs to get out of here for change in scenery  Objective: Vital signs in last 24 hours: Temp:  [98.3 F (36.8 C)-98.7 F (37.1 C)] 98.7 F (37.1 C) (05/31 0531) Pulse Rate:  [52-63] 52 (05/31 0531) Cardiac Rhythm: Normal sinus rhythm (05/30 1900) Resp:  [18-20] 18 (05/31 0531) BP: (101-124)/(59-74) 124/74 (05/31 0531) SpO2:  [99 %-100 %] 99 % (05/31 0531)  Intake/Output from previous day: 05/30 0701 - 05/31 0700 In: 730 [P.O.:720; I.V.:10] Out: 4 [Drains:50]  General appearance: alert, cooperative and no distress Heart: regular rate and rhythm Lungs: clear to auscultation bilaterally Abdomen: soft, non-tender; bowel sounds normal; no masses,  no organomegaly Extremities: extremities normal, atraumatic, no cyanosis or edema Wound: wound vac in place  Lab Results:  Recent Labs  09/08/16 0827 09/09/16 0435  WBC 8.3 9.8  HGB 9.4* 9.4*  HCT 30.1* 30.2*  PLT 520* 465*   BMET:  Recent Labs  09/08/16 0827 09/09/16 0435  NA 134* 136  K 4.2 4.3  CL 101 102  CO2 26 27  GLUCOSE 169* 99  BUN 12 10  CREATININE 0.79 0.71  CALCIUM 8.5* 8.8*    PT/INR: No results for input(s): LABPROT, INR in the last 72 hours. ABG No results found for: PHART, HCO3, TCO2, ACIDBASEDEF, O2SAT CBG (last 3)  No results for input(s): GLUCAP in the last 72 hours.  Assessment/Plan: S/P Procedure(s) (LRB): IRRIGATION AND DEBRIDEMENT OF CHEST WOUND, PLACEMENT OF A CELL AND VAC (Right) APPLICATION OF A-CELL OF CHEST/ABDOMEN (Right)  1. CV- hemodynamically stable 2. ID- appreciate ID input, on Vancomycin and they recommend 8 weeks of treatment 3. Right SCV Joint abscess, s/p debridement and  placement of A-Cell 4. Dispo- care per primary, 8 weeks IV vanco per ID, placed social work consult for SNF placement as patient with recent IV drug abuse is not a candidate for home PICC line  LOS: 9 days    Ahmed Prima, Encarnacion Bole 09/09/2016

## 2016-09-09 NOTE — Progress Notes (Signed)
    [facility] for Infectious Disease    Date of Admission:  08/31/2016   Total days of antibiotics 10        Day 10 Vancomycin   ID: [name] is a 46 y.o. woman with hx of hepatitis C and IV drug abuse here with recurrent right SCV joint infection s/p I&D and s/p wound excision with Acell placement.     Subjective: No complaints today. Reports she is ready to get out of hospital. She states she will stay at the SNF to get her IV antibiotics as she wants to get better. She feels reassured knowing that she has an exact timeline of how long she will be there.  Medications:  IV Vanc  Objective: Vital signs in last 24 hours: Temp:  [98.3 F (36.8 C)-98.7 F (37.1 C)] 98.7 F (37.1 C) (05/31 0531) Pulse Rate:  [52-63] 52 (05/31 0531) Resp:  [18-20] 18 (05/31 0531) BP: (101-124)/(59-74) 124/74 (05/31 0531) SpO2:  [99 %-100 %] 99 % (05/31 0531)  General: Well-nourished woman sitting up in chair, NAD CV: RRR, no m/g/r Pulm: CTA bilaterally, breaths non-labored Skin: Right sided sternal wound vac in place, no surrounding erythema   Lab Results  Recent Labs  09/08/16 0827 09/09/16 0435  WBC 8.3 9.8  HGB 9.4* 9.4*  HCT 30.1* 30.2*  NA 134* 136  K 4.2 4.3  CL 101 102  CO2 26 27  BUN 12 10  CREATININE 0.79 0.71    Microbiology: Blood cx 5/22>> NGTD Wound cx 5/24>> MRSA + coag neg staph   Studies/Results: CT Chest 5/22 IMPRESSION: 1. Large region of phlegmon and septic effusion again noted at the right sternoclavicular and right first costomanubrial joints. New air within this collection likely reflects interval drainage of the superficial abscess. The phlegmon measures 8-9 cm, with underlying mild bony destruction and diffuse sclerosis again noted. Sclerosis involves the right clavicle, right first rib and right side of the sternum. Mild extension of phlegmon to the superior aspect of the mediastinum. 2. Mild right-sided airspace opacity may reflect  atelectasis or possibly mild pneumonia, including mild opacity underlying the right anterior chest wall phlegmon. 3. Vague soft tissue inflammation tracking about the right breast, reflecting cellulitis. 4. Mild asymmetric prominence of right axillary nodes, without significant axillary lymphadenopathy. 5. Mild splenomegaly.  Assessment/Plan: [name] is a 46yo woman with hx of hepatitis C and IV drug abuse here with recurrent right SCV joint infection.   Right Sternoclavicular Joint Abscess/Osteomyelitis: s/p I&D on 5/24 and s/p wound excision with Acell placement on 5/29. Wound vac currently in place. Blood cx are negative. Wound cx growing MRSA and coagulase negative Staph. Plan is for 8 weeks total of IV vancomycin. CVTS has placed social work consult for SNF placement. Patient is agreeable to plan.   Hepatitis C: Diagnosed in 2016 with positive Hep C antibody and HCV quant >4 million. Genotype tested in Dec 2017 was 1a.  - Will need outpatient ID follow up - Will check HIV given continued IV drug use>> pending  - Will consider Hep C treatment after 6 month sobriety from IVDU. She is 4 wk out from last IVDU per her report.  Albin Felling, MD, MPH Internal Medicine Resident, PGY-3 09/09/2016, 11:01 AM

## 2016-09-09 NOTE — Progress Notes (Signed)
PROGRESS NOTE    [name]  XTG:[id number] DOB: 11-Feb-1971 DOA: 08/31/2016 PCP: Patient, [name]   Chief Complaint  Patient presents with  . Abscess    Brief Narrative:  HPI on 08/31/2016 by Dr. Steward Ros [name] is a 46 y.o. female with medical history significant of IVDA, septic joint, septic emboli, endocardititis with recent long hospitalization in dec of 2017 for absess to chest wall with septic joint and infected valve comes in with 3 days of developing mass/absess to the same area to right chest wall with redness that has spread down to right breast with associated pain with breathing and moving right shoulder.  Pt reports when she left hospital she was sober until her mother died in 2022-05-13.  She was on sub axone in [place] but transportation became an issue, then her mom died and she has been using heroin since.  She uses at least every other day, is no longer on subaxone.   She does not use needles to her chest, only to her upper extremities.    Interim history S/p I&D of the right sternoclavicular joint by plastics. ID consulted and recommended 8 weeks of vancomycin.   Assessment & Plan   Right sternoclavicular joint abscess -Blood cultures from 08/31/2016 show no growth to date -Wound culture from 09/02/2016 showed MRSA and staph coag negative -Cardiothoracic and plastic surgery consulted appreciated -Status post excision of the right chest wound 4 x 6 x 3 cm with placement of Acell (2g powder and 7x10cm sheet), placement of VAC on 09/07/2016 by Dr. Marla Roe -Initially placed on vancomycin and cefepime -Of note, patient did have TEE on 09/02/2016 which showed no aortic vegetation and no mitral valve leaflet thickening or calcification, no PFO. EF 55-60% -Discussed antibiotic therapy with infectious disease. Dr. Baxter Flattery recommends 8 weeks of vancomycin. -Ceftriaxone discontinued -Patient is not a candidate for [facility] line placement given her history of IV drug  abuse. -Will discuss discharge planning with Dr. Marla Roe, as patient has wound vac in place.  -Possible LTAC vs SNF.  Bradycardia -Resolved -Methadone discontinued -Currently asymptomatic  Polysubstance abuse -Discussed cessation -Tox screen positive for cocaine and opiates  Hepatitis C infection -Will need outpatient follow-up, possibly treatment in 6 months pending her ability to refrain from drug use. -HIV nonreactive  COPD -Currently stable, no wheezing on exam  Chronic anemia -Baseline hemoglobin between 8 and 9, currently 9.4 -Continue to monitor CBC  Thrombocytosis -Possibly reactive -Continue to monitor CBC  DVT Prophylaxis  SCDs  Code Status: Full  Family Communication: Family at bedside  Disposition Plan: Admitted. Dispo TBD  Consultants Cardiothoracic surgery Plastic surgery Infectious disease  Procedures  TEE Eexcision of the right chest wound 4 x 6 x 3 cm with placement of Acell (2g powder and 7x10cm sheet), placement of VAC  Antibiotics   Anti-infectives    Start     Dose/Rate Route Frequency Ordered Stop   09/07/16 1454  polymyxin B 500,000 Units, bacitracin 50,000 Units in sodium chloride irrigation 0.9 % 500 mL irrigation  Status:  Discontinued       As needed 09/07/16 1454 09/07/16 1517   09/04/16 1530  vancomycin (VANCOCIN) 1,250 mg in sodium chloride 0.9 % 250 mL IVPB     1,250 mg 166.7 mL/hr over 90 Minutes Intravenous Every 12 hours 09/04/16 1514     09/01/16 1400  ceFEPIme (MAXIPIME) 2 g in dextrose 5 % 50 mL IVPB  Status:  Discontinued     2  g 100 mL/hr over 30 Minutes Intravenous Every 8 hours 09/01/16 1012 09/08/16 1642   08/31/16 2300  vancomycin (VANCOCIN) IVPB 1000 mg/200 mL premix  Status:  Discontinued     1,000 mg 200 mL/hr over 60 Minutes Intravenous Every 12 hours 08/31/16 2221 09/04/16 1514   08/31/16 2300  ceFEPIme (MAXIPIME) 2 g in dextrose 5 % 50 mL IVPB  Status:  Discontinued     2 g 100 mL/hr over 30 Minutes  Intravenous Every 12 hours 08/31/16 2221 09/01/16 1012   08/31/16 1445  vancomycin (VANCOCIN) IVPB 1000 mg/200 mL premix     1,000 mg 200 mL/hr over 60 Minutes Intravenous  Once 08/31/16 1438 08/31/16 1616      Subjective:   [name] seen and examined today.  Patient feels pain is mild. Patient is and see about staying in her room and would like to be discharged home but understands the need for hospitalization. Denies chest pain, shortness of breath, abdominal pain, nausea or vomiting, diarrhea or constipation.   Objective:   Vitals:   09/08/16 1500 09/08/16 1925 09/09/16 0531 09/09/16 1342  BP: (!) 101/59 111/61 124/74 (!) 110/59  Pulse: (!) 58 63 (!) 52 70  Resp: 20 18 18 18   Temp:  98.3 F (36.8 C) 98.7 F (37.1 C) 98.1 F (36.7 C)  TempSrc:  Oral Oral Oral  SpO2: 100% 100% 99% 100%  Weight:      Height:        Intake/Output Summary (Last 24 hours) at 09/09/16 1439 Last data filed at 09/09/16 1100  Gross per 24 hour  Intake              730 ml  Output              850 ml  Net             -120 ml   Filed Weights   08/31/16 1213 08/31/16 2222  Weight: 72.6 kg (160 lb) 74.6 kg (164 lb 6.4 oz)   Exam  General: Well developed, well nourished, NAD, appears stated age  HEENT: NCAT,  mucous membranes moist.   Cardiovascular: S1 S2 auscultated,RRR, no murmurs  Chest wall: VAC in place  Respiratory: Clear to auscultation bilaterally with equal chest rise  Abdomen: Soft, nontender, nondistended, + bowel sounds  Extremities: warm dry without cyanosis clubbing or edema  Neuro: AAOx3, nonfocal  Psych: appropriate mood and affect   Data Reviewed: I have personally reviewed following labs and imaging studies  CBC:  Recent Labs Lab 09/03/16 0216 09/08/16 0827 09/09/16 0435  WBC 10.2 8.3 9.8  HGB 8.4* 9.4* 9.4*  HCT 26.5* 30.1* 30.2*  MCV 75.1* 78.4 78.4  PLT 457* 520* 859*   Basic Metabolic Panel:  Recent Labs Lab 09/04/16 1439 09/07/16 0412  09/08/16 0827 09/09/16 0435  NA 133* 135 134* 136  K 3.8 4.2 4.2 4.3  CL 104 102 101 102  CO2 24 27 26 27   GLUCOSE 112* 99 169* 99  BUN 9 16 12 10   CREATININE 0.94 0.74 0.79 0.71  CALCIUM 8.0* 8.4* 8.5* 8.8*   GFR: Estimated Creatinine Clearance: 82.9 mL/min (by C-G formula based on SCr of 0.71 mg/dL). Liver Function Tests: No results for input(s): AST, ALT, ALKPHOS, BILITOT, PROT, ALBUMIN in the last 168 hours. No results for input(s): LIPASE, AMYLASE in the last 168 hours. No results for input(s): AMMONIA in the last 168 hours. Coagulation Profile: No results for input(s): INR, PROTIME in the last  168 hours. Cardiac Enzymes: No results for input(s): CKTOTAL, CKMB, CKMBINDEX, TROPONINI in the last 168 hours. BNP (last 3 results) No results for input(s): PROBNP in the last 8760 hours. HbA1C: No results for input(s): HGBA1C in the last 72 hours. CBG: No results for input(s): GLUCAP in the last 168 hours. Lipid Profile: No results for input(s): CHOL, HDL, LDLCALC, TRIG, CHOLHDL, LDLDIRECT in the last 72 hours. Thyroid Function Tests: No results for input(s): TSH, T4TOTAL, FREET4, T3FREE, THYROIDAB in the last 72 hours. Anemia Panel: No results for input(s): VITAMINB12, FOLATE, FERRITIN, TIBC, IRON, RETICCTPCT in the last 72 hours. Urine analysis:    Component Value Date/Time   COLORURINE YELLOW 08/31/2016 2108   APPEARANCEUR CLEAR 08/31/2016 2108   LABSPEC 1.025 08/31/2016 2108   PHURINE 7.0 08/31/2016 2108   GLUCOSEU NEGATIVE 08/31/2016 2108   HGBUR SMALL (A) 08/31/2016 2108   BILIRUBINUR NEGATIVE 08/31/2016 2108   KETONESUR NEGATIVE 08/31/2016 2108   PROTEINUR NEGATIVE 08/31/2016 2108   UROBILINOGEN 0.2 10/18/2014 0100   NITRITE NEGATIVE 08/31/2016 2108   LEUKOCYTESUR SMALL (A) 08/31/2016 2108   Sepsis Labs: @LABRCNTIP (procalcitonin:4,lacticidven:4)  ) Recent Results (from the past 240 hour(s))  Culture, blood (Routine X 2) w Reflex to ID Panel     Status: None    Collection Time: 08/31/16  2:55 PM  Result Value Ref Range Status   Specimen Description LEFT ANTECUBITAL  Final   Special Requests   Final    BOTTLES DRAWN AEROBIC AND ANAEROBIC Blood Culture adequate volume   Culture NO GROWTH 5 DAYS  Final   Report Status 09/05/2016 FINAL  Final  Culture, blood (Routine X 2) w Reflex to ID Panel     Status: None   Collection Time: 08/31/16  3:04 PM  Result Value Ref Range Status   Specimen Description RIGHT ANTECUBITAL  Final   Special Requests   Final    BOTTLES DRAWN AEROBIC AND ANAEROBIC Blood Culture adequate volume   Culture NO GROWTH 5 DAYS  Final   Report Status 09/05/2016 FINAL  Final  MRSA PCR Screening     Status: None   Collection Time: 08/31/16 10:26 PM  Result Value Ref Range Status   MRSA by PCR NEGATIVE NEGATIVE Final    Comment:        The GeneXpert MRSA Assay (FDA approved for NASAL specimens only), is one component of a comprehensive MRSA colonization surveillance program. It is not intended to diagnose MRSA infection nor to guide or monitor treatment for MRSA infections.   Aerobic/Anaerobic Culture (surgical/deep wound)     Status: None   Collection Time: 09/02/16  8:31 AM  Result Value Ref Range Status   Specimen Description ABSCESS  Final   Special Requests RIGHT STERNOCLAVICULAR POF VANC AND CEFEPINE  Final   Gram Stain   Final    FEW WBC PRESENT, PREDOMINANTLY PMN FEW GRAM POSITIVE COCCI RARE GRAM VARIABLE ROD    Culture   Final    MODERATE METHICILLIN RESISTANT STAPHYLOCOCCUS AUREUS NO ANAEROBES ISOLATED    Report Status 09/07/2016 FINAL  Final   Organism ID, Bacteria METHICILLIN RESISTANT STAPHYLOCOCCUS AUREUS  Final      Susceptibility   Methicillin resistant staphylococcus aureus - MIC*    CIPROFLOXACIN >=8 RESISTANT Resistant     ERYTHROMYCIN >=8 RESISTANT Resistant     GENTAMICIN <=0.5 SENSITIVE Sensitive     OXACILLIN >=4 RESISTANT Resistant     TETRACYCLINE <=1 SENSITIVE Sensitive      VANCOMYCIN 1 SENSITIVE Sensitive  TRIMETH/SULFA <=10 SENSITIVE Sensitive     CLINDAMYCIN >=8 RESISTANT Resistant     RIFAMPIN <=0.5 SENSITIVE Sensitive     Inducible Clindamycin NEGATIVE Sensitive     * MODERATE METHICILLIN RESISTANT STAPHYLOCOCCUS AUREUS  Fungus Culture With Stain     Status: None (Preliminary result)   Collection Time: 09/02/16  8:31 AM  Result Value Ref Range Status   Fungus Stain Final report  Final    Comment: (NOTE) Performed At: [facility] [place], [place] [id number] Lindon Romp MD [id number]:[id number]    Fungus (Mycology) Culture PENDING  Incomplete   Fungal Source ABSCESS  Final    Comment: RIGHT STERNOCLAVICULAR POF VANC AND CEFEPINE  Fungus Culture Result     Status: None   Collection Time: 09/02/16  8:31 AM  Result Value Ref Range Status   Result 1 Comment  Final    Comment: (NOTE) KOH/Calcofluor preparation:  no fungus observed. Performed At: [facility] [place], [place] [id number] Lindon Romp MD [id number]   Aerobic/Anaerobic Culture (surgical/deep wound)     Status: None   Collection Time: 09/02/16  8:46 AM  Result Value Ref Range Status   Specimen Description TISSUE  Final   Special Requests RIGHT STERNOCLAVICULAR TIS POF VANC AND CEFEPINE  Final   Gram Stain   Final    FEW WBC PRESENT, PREDOMINANTLY PMN NO ORGANISMS SEEN    Culture   Final    FEW METHICILLIN RESISTANT STAPHYLOCOCCUS AUREUS RARE STAPHYLOCOCCUS SPECIES (COAGULASE NEGATIVE) CRITICAL RESULT CALLED TO, READ BACK BY AND VERIFIED WITH: F. PASEDA RN, AT 1235 09/03/16 BY D.VANHOOK REGARDING CULTURE GROWTH NO ANAEROBES ISOLATED    Report Status 09/07/2016 FINAL  Final   Organism ID, Bacteria METHICILLIN RESISTANT STAPHYLOCOCCUS AUREUS  Final   Organism ID, Bacteria STAPHYLOCOCCUS SPECIES (COAGULASE NEGATIVE)  Final      Susceptibility   Methicillin resistant staphylococcus aureus - MIC*    CIPROFLOXACIN >=8 RESISTANT  Resistant     ERYTHROMYCIN >=8 RESISTANT Resistant     GENTAMICIN <=0.5 SENSITIVE Sensitive     OXACILLIN >=4 RESISTANT Resistant     TETRACYCLINE <=1 SENSITIVE Sensitive     VANCOMYCIN 1 SENSITIVE Sensitive     TRIMETH/SULFA <=10 SENSITIVE Sensitive     CLINDAMYCIN >=8 RESISTANT Resistant     RIFAMPIN <=0.5 SENSITIVE Sensitive     Inducible Clindamycin NEGATIVE Sensitive     * FEW METHICILLIN RESISTANT STAPHYLOCOCCUS AUREUS   Staphylococcus species (coagulase negative) - MIC*    CIPROFLOXACIN 4 RESISTANT Resistant     ERYTHROMYCIN >=8 RESISTANT Resistant     GENTAMICIN <=0.5 SENSITIVE Sensitive     OXACILLIN >=4 RESISTANT Resistant     TETRACYCLINE >=16 RESISTANT Resistant     VANCOMYCIN 2 SENSITIVE Sensitive     TRIMETH/SULFA >=320 RESISTANT Resistant     CLINDAMYCIN <=0.25 SENSITIVE Sensitive     RIFAMPIN <=0.5 SENSITIVE Sensitive     Inducible Clindamycin NEGATIVE Sensitive     * RARE STAPHYLOCOCCUS SPECIES (COAGULASE NEGATIVE)  Fungus Culture With Stain     Status: None (Preliminary result)   Collection Time: 09/02/16  8:46 AM  Result Value Ref Range Status   Fungus Stain Final report  Final    Comment: (NOTE) Performed At: [facility] [address] [place], [place] [id number] Lindon Romp MD XT:[id number]    Fungus (Mycology) Culture PENDING  Incomplete   Fungal Source TISSUE  Final    Comment: RIGHT STERNOCLAVIVULAR POF VANC AND CEFEPINE  Fungus Culture Result     Status: None   Collection Time: 09/02/16  8:46 AM  Result Value Ref Range Status   Result 1 Comment  Final    Comment: (NOTE) KOH/Calcofluor preparation:  no fungus observed. Performed At: [facility] [address] [place], [place] [id number] Lindon Romp MD [id number]:[id number]       Radiology Studies: No results found.   Scheduled Meds: Continuous Infusions: . lactated ringers 50 mL/hr at 09/07/16 1407  . vancomycin Stopped (09/09/16 9371)     LOS: 9 days   Time Spent  in minutes   30 minutes  Davison Ohms D.O. on 09/09/2016 at 2:39 PM  Between 7am to 7pm - Pager - [phone]  After 7pm go to [email or web address] - password [email or web address]  And look for the night coverage person covering for me after hours  [facility] Office  [phone]

## 2016-09-09 NOTE — Progress Notes (Addendum)
Spoke with Dr Ree Kida about disposition in progression. Dr Ree Kida will speak with cardiothoracic surgery to discuss possibility of LTAC. Patient with extensive wound requiring VAC to sternum. Had debridement on 5/29 with Acell placed.  Patient will require IV Vanc for 8 weeks, current drug use and not a candidate for HH. Left [facility] AMA previously and not a candidate for SNF either. CM consult placed for LTAC review. [facility] notified. [facility] cannot accept at this time. Waiting to hear back from [facility] and will approach patient about LTAC if they are able to take her and surgery agreeable to that disposition.  16:00 Spoke with Ellwood Handler, cardiovasc sx extender, agreeable to LTAC for dispo if it can be arranged, however [facility] not able to take patient this week, may be able to Monday. Will continue to follow for dc planning and revisit [facility] as option Monday.

## 2016-09-10 ENCOUNTER — Encounter (HOSPITAL_COMMUNITY): Payer: Self-pay | Admitting: Plastic Surgery

## 2016-09-10 DIAGNOSIS — R51 Headache: Secondary | ICD-10-CM

## 2016-09-10 LAB — BASIC METABOLIC PANEL
Anion gap: 7 (ref 5–15)
BUN: 9 mg/dL (ref 6–20)
CO2: 27 mmol/L (ref 22–32)
Calcium: 9.1 mg/dL (ref 8.9–10.3)
Chloride: 101 mmol/L (ref 101–111)
Creatinine, Ser: 0.69 mg/dL (ref 0.44–1.00)
GFR calc Af Amer: >60 mL/min (ref >60)
GFR calc non Af Amer: >60 mL/min (ref >60)
Glucose, Bld: 107 mg/dL — ABNORMAL HIGH (ref 65–99)
Potassium: 4.1 mmol/L (ref 3.5–5.1)
Sodium: 135 mmol/L (ref 135–145)

## 2016-09-10 LAB — CBC
HCT: 30.5 % — ABNORMAL LOW (ref 36.0–46.0)
Hemoglobin: 9.5 g/dL — ABNORMAL LOW (ref 12.0–15.0)
MCH: 24.5 pg — ABNORMAL LOW (ref 26.0–34.0)
MCHC: 31.1 g/dL (ref 30.0–36.0)
MCV: 78.6 fL (ref 78.0–100.0)
Platelets: 478 10*3/uL — ABNORMAL HIGH (ref 150–400)
RBC: 3.88 MIL/uL (ref 3.87–5.11)
RDW: 19 % — ABNORMAL HIGH (ref 11.5–15.5)
WBC: 11 10*3/uL — ABNORMAL HIGH (ref 4.0–10.5)

## 2016-09-10 LAB — RAPID URINE DRUG SCREEN, HOSP PERFORMED
Amphetamines: NOT DETECTED
Barbiturates: NOT DETECTED
Benzodiazepines: NOT DETECTED
Cocaine: POSITIVE — AB
Opiates: NOT DETECTED
Tetrahydrocannabinol: NOT DETECTED

## 2016-09-10 MED ORDER — ASPIRIN-ACETAMINOPHEN-CAFFEINE 250-250-65 MG PO TABS
2.0000 | ORAL_TABLET | Freq: Once | ORAL | Status: AC
  Administered 2016-09-10: 2 via ORAL
  Filled 2016-09-10 (×2): qty 2

## 2016-09-10 MED ORDER — DOCUSATE SODIUM 100 MG PO CAPS
100.0000 mg | ORAL_CAPSULE | Freq: Two times a day (BID) | ORAL | Status: DC
  Administered 2016-09-10 – 2016-09-11 (×3): 100 mg via ORAL
  Filled 2016-09-10 (×4): qty 1

## 2016-09-10 NOTE — Progress Notes (Signed)
PROGRESS NOTE    [name]  DGL:[id number] DOB: 1970/12/14 DOA: 08/31/2016 PCP: Patient, [name]   Chief Complaint  Patient presents with  . Abscess    Brief Narrative:  HPI on 08/31/2016 by Dr. Steward Ros [name] is a 46 y.o. female with medical history significant of IVDA, septic joint, septic emboli, endocardititis with recent long hospitalization in dec of 2017 for absess to chest wall with septic joint and infected valve comes in with 3 days of developing mass/absess to the same area to right chest wall with redness that has spread down to right breast with associated pain with breathing and moving right shoulder.  Pt reports when she left hospital she was sober until her mother died in 06/01/2022.  She was on sub axone in [place] but transportation became an issue, then her mom died and she has been using heroin since.  She uses at least every other day, is no longer on subaxone.   She does not use needles to her chest, only to her upper extremities.    Interim history S/p I&D of the right sternoclavicular joint by plastics. ID consulted and recommended 8 weeks of vancomycin.   Assessment & Plan   Right sternoclavicular joint abscess -Blood cultures from 08/31/2016 show no growth to date -Wound culture from 09/02/2016 showed MRSA and staph coag negative -Cardiothoracic and plastic surgery consulted appreciated -Status post excision of the right chest wound 4 x 6 x 3 cm with placement of Acell (2g powder and 7x10cm sheet), placement of VAC on 09/07/2016 by Dr. Marla Roe -Initially placed on vancomycin and cefepime -Of note, patient did have TEE on 09/02/2016 which showed no aortic vegetation and no mitral valve leaflet thickening or calcification, no PFO. EF 55-60% -Discussed antibiotic therapy with infectious disease. Dr. Baxter Flattery recommends 8 weeks of vancomycin. -Ceftriaxone discontinued -patient is not a candidate for PICC line and home discharge given her history of  IVDU -SW/CM working on dispo placement. SNF at this time is not an option given patient's past and leaving SNF AMA. Looking into LTAC placement at [facility], however, this will not occur until at least 09/13/2016 -Discussed with Dr. Marla Roe, patient will need to follow up with her in the office next week on 6/5 or 6/6  Bradycardia -Resolved -Methadone discontinued -Currently asymptomatic  Polysubstance abuse -Discussed cessation -Tox screen positive for cocaine and opiates -per RN, patient had some irrational behavior last night -Have ordered repeat urine drug screen  Hepatitis C infection -Will need outpatient follow-up, possibly treatment in 6 months pending her ability to refrain from drug use. -HIV nonreactive  COPD -Currently stable, no wheezing on exam  Chronic anemia -Baseline hemoglobin between 8 and 9, currently 9.5 -Continue to monitor CBC  Thrombocytosis -Possibly reactive -Continue to monitor CBC  Social issues -patient states she lost her uncle yesterday to brain cancer and is very upset  Headache -possibly secondary to stress vs grief over uncle's passing -Will order Excedrin   DVT Prophylaxis  SCDs  Code Status: Full  Family Communication: Family at bedside  Disposition Plan: Admitted. Dispo TBD- possibly LTAC  Consultants Cardiothoracic surgery Plastic surgery Infectious disease  Procedures  TEE Eexcision of the right chest wound 4 x 6 x 3 cm with placement of Acell (2g powder and 7x10cm sheet), placement of VAC  Antibiotics   Anti-infectives    Start     Dose/Rate Route Frequency Ordered Stop   09/07/16 1454  polymyxin B 500,000 Units, bacitracin 50,000 Units  in sodium chloride irrigation 0.9 % 500 mL irrigation  Status:  Discontinued       As needed 09/07/16 1454 09/07/16 1517   09/04/16 1530  vancomycin (VANCOCIN) 1,250 mg in sodium chloride 0.9 % 250 mL IVPB     1,250 mg 166.7 mL/hr over 90 Minutes Intravenous Every 12 hours 09/04/16  1514     09/01/16 1400  ceFEPIme (MAXIPIME) 2 g in dextrose 5 % 50 mL IVPB  Status:  Discontinued     2 g 100 mL/hr over 30 Minutes Intravenous Every 8 hours 09/01/16 1012 09/08/16 1642   08/31/16 2300  vancomycin (VANCOCIN) IVPB 1000 mg/200 mL premix  Status:  Discontinued     1,000 mg 200 mL/hr over 60 Minutes Intravenous Every 12 hours 08/31/16 2221 09/04/16 1514   08/31/16 2300  ceFEPIme (MAXIPIME) 2 g in dextrose 5 % 50 mL IVPB  Status:  Discontinued     2 g 100 mL/hr over 30 Minutes Intravenous Every 12 hours 08/31/16 2221 09/01/16 1012   08/31/16 1445  vancomycin (VANCOCIN) IVPB 1000 mg/200 mL premix     1,000 mg 200 mL/hr over 60 Minutes Intravenous  Once 08/31/16 1438 08/31/16 1616      Subjective:   [name] seen and examined today.  Patient complains of headache. Sad that her uncle passed yesterday. She feels some pain and discomfort today. Denies shortness of breath, chest pain, abdominal pain, N/V/D/C.   Objective:   Vitals:   09/09/16 1342 09/09/16 2130 09/10/16 0424 09/10/16 1016  BP: (!) 110/59 130/62 116/65 121/74  Pulse: 70 64 (!) 54 60  Resp: 18 18 18 18   Temp: 98.1 F (36.7 C) 98.1 F (36.7 C) 98 F (36.7 C) 98.2 F (36.8 C)  TempSrc: Oral Oral Oral Oral  SpO2: 100% 100% 100% 100%  Weight:      Height:        Intake/Output Summary (Last 24 hours) at 09/10/16 1159 Last data filed at 09/10/16 0415  Gross per 24 hour  Intake              260 ml  Output              500 ml  Net             -240 ml   Filed Weights   08/31/16 1213 08/31/16 2222  Weight: 72.6 kg (160 lb) 74.6 kg (164 lb 6.4 oz)   Exam  General: Well developed, well nourished, no acute distress  HEENT: NCAT, mucous membranes moist.   Cardiovascular: S1 S2 auscultated, no rubs, murmurs or gallops. Regular rate and rhythm.  Respiratory: Clear to auscultation bilaterally with equal chest rise  Chest wall: Wound vac in place  Abdomen: Soft, nontender, nondistended, + bowel  sounds  Extremities: warm dry without cyanosis clubbing or edema  Neuro: AAOx3, nonfocal  Psych: appropriate  Data Reviewed: I have personally reviewed following labs and imaging studies  CBC:  Recent Labs Lab 09/08/16 0827 09/09/16 0435 09/10/16 0404  WBC 8.3 9.8 11.0*  HGB 9.4* 9.4* 9.5*  HCT 30.1* 30.2* 30.5*  MCV 78.4 78.4 78.6  PLT 520* 465* 885*   Basic Metabolic Panel:  Recent Labs Lab 09/04/16 1439 09/07/16 0412 09/08/16 0827 09/09/16 0435 09/10/16 0404  NA 133* 135 134* 136 135  K 3.8 4.2 4.2 4.3 4.1  CL 104 102 101 102 101  CO2 24 27 26 27 27   GLUCOSE 112* 99 169* 99 107*  BUN 9 16  12 10 9   CREATININE 0.94 0.74 0.79 0.71 0.69  CALCIUM 8.0* 8.4* 8.5* 8.8* 9.1   GFR: Estimated Creatinine Clearance: 82.9 mL/min (by C-G formula based on SCr of 0.69 mg/dL). Liver Function Tests: No results for input(s): AST, ALT, ALKPHOS, BILITOT, PROT, ALBUMIN in the last 168 hours. No results for input(s): LIPASE, AMYLASE in the last 168 hours. No results for input(s): AMMONIA in the last 168 hours. Coagulation Profile: No results for input(s): INR, PROTIME in the last 168 hours. Cardiac Enzymes: No results for input(s): CKTOTAL, CKMB, CKMBINDEX, TROPONINI in the last 168 hours. BNP (last 3 results) No results for input(s): PROBNP in the last 8760 hours. HbA1C: No results for input(s): HGBA1C in the last 72 hours. CBG: No results for input(s): GLUCAP in the last 168 hours. Lipid Profile: No results for input(s): CHOL, HDL, LDLCALC, TRIG, CHOLHDL, LDLDIRECT in the last 72 hours. Thyroid Function Tests: No results for input(s): TSH, T4TOTAL, FREET4, T3FREE, THYROIDAB in the last 72 hours. Anemia Panel: No results for input(s): VITAMINB12, FOLATE, FERRITIN, TIBC, IRON, RETICCTPCT in the last 72 hours. Urine analysis:    Component Value Date/Time   COLORURINE YELLOW 08/31/2016 2108   APPEARANCEUR CLEAR 08/31/2016 2108   LABSPEC 1.025 08/31/2016 2108   PHURINE  7.0 08/31/2016 2108   GLUCOSEU NEGATIVE 08/31/2016 2108   HGBUR SMALL (A) 08/31/2016 2108   BILIRUBINUR NEGATIVE 08/31/2016 2108   KETONESUR NEGATIVE 08/31/2016 2108   PROTEINUR NEGATIVE 08/31/2016 2108   UROBILINOGEN 0.2 10/18/2014 0100   NITRITE NEGATIVE 08/31/2016 2108   LEUKOCYTESUR SMALL (A) 08/31/2016 2108   Sepsis Labs: @LABRCNTIP (procalcitonin:4,lacticidven:4)  ) Recent Results (from the past 240 hour(s))  Culture, blood (Routine X 2) w Reflex to ID Panel     Status: None   Collection Time: 08/31/16  2:55 PM  Result Value Ref Range Status   Specimen Description LEFT ANTECUBITAL  Final   Special Requests   Final    BOTTLES DRAWN AEROBIC AND ANAEROBIC Blood Culture adequate volume   Culture NO GROWTH 5 DAYS  Final   Report Status 09/05/2016 FINAL  Final  Culture, blood (Routine X 2) w Reflex to ID Panel     Status: None   Collection Time: 08/31/16  3:04 PM  Result Value Ref Range Status   Specimen Description RIGHT ANTECUBITAL  Final   Special Requests   Final    BOTTLES DRAWN AEROBIC AND ANAEROBIC Blood Culture adequate volume   Culture NO GROWTH 5 DAYS  Final   Report Status 09/05/2016 FINAL  Final  MRSA PCR Screening     Status: None   Collection Time: 08/31/16 10:26 PM  Result Value Ref Range Status   MRSA by PCR NEGATIVE NEGATIVE Final    Comment:        The GeneXpert MRSA Assay (FDA approved for NASAL specimens only), is one component of a comprehensive MRSA colonization surveillance program. It is not intended to diagnose MRSA infection nor to guide or monitor treatment for MRSA infections.   Aerobic/Anaerobic Culture (surgical/deep wound)     Status: None   Collection Time: 09/02/16  8:31 AM  Result Value Ref Range Status   Specimen Description ABSCESS  Final   Special Requests RIGHT STERNOCLAVICULAR POF VANC AND CEFEPINE  Final   Gram Stain   Final    FEW WBC PRESENT, PREDOMINANTLY PMN FEW GRAM POSITIVE COCCI RARE GRAM VARIABLE ROD    Culture    Final    MODERATE METHICILLIN RESISTANT STAPHYLOCOCCUS AUREUS NO ANAEROBES  ISOLATED    Report Status 09/07/2016 FINAL  Final   Organism ID, Bacteria METHICILLIN RESISTANT STAPHYLOCOCCUS AUREUS  Final      Susceptibility   Methicillin resistant staphylococcus aureus - MIC*    CIPROFLOXACIN >=8 RESISTANT Resistant     ERYTHROMYCIN >=8 RESISTANT Resistant     GENTAMICIN <=0.5 SENSITIVE Sensitive     OXACILLIN >=4 RESISTANT Resistant     TETRACYCLINE <=1 SENSITIVE Sensitive     VANCOMYCIN 1 SENSITIVE Sensitive     TRIMETH/SULFA <=10 SENSITIVE Sensitive     CLINDAMYCIN >=8 RESISTANT Resistant     RIFAMPIN <=0.5 SENSITIVE Sensitive     Inducible Clindamycin NEGATIVE Sensitive     * MODERATE METHICILLIN RESISTANT STAPHYLOCOCCUS AUREUS  Fungus Culture With Stain     Status: None (Preliminary result)   Collection Time: 09/02/16  8:31 AM  Result Value Ref Range Status   Fungus Stain Final report  Final    Comment: (NOTE) Performed At: [facility] [place], [place] [id number] Lindon Romp MD YK:[id number]    Fungus (Mycology) Culture PENDING  Incomplete   Fungal Source ABSCESS  Final    Comment: RIGHT STERNOCLAVICULAR POF VANC AND CEFEPINE  Fungus Culture Result     Status: None   Collection Time: 09/02/16  8:31 AM  Result Value Ref Range Status   Result 1 Comment  Final    Comment: (NOTE) KOH/Calcofluor preparation:  no fungus observed. Performed At: [facility] [place], [place] [id number] Lindon Romp MD [id number]   Aerobic/Anaerobic Culture (surgical/deep wound)     Status: None   Collection Time: 09/02/16  8:46 AM  Result Value Ref Range Status   Specimen Description TISSUE  Final   Special Requests RIGHT STERNOCLAVICULAR TIS POF VANC AND CEFEPINE  Final   Gram Stain   Final    FEW WBC PRESENT, PREDOMINANTLY PMN NO ORGANISMS SEEN    Culture   Final    FEW METHICILLIN RESISTANT STAPHYLOCOCCUS AUREUS RARE  STAPHYLOCOCCUS SPECIES (COAGULASE NEGATIVE) CRITICAL RESULT CALLED TO, READ BACK BY AND VERIFIED WITH: F. PASEDA RN, AT 1235 09/03/16 BY D.VANHOOK REGARDING CULTURE GROWTH NO ANAEROBES ISOLATED    Report Status 09/07/2016 FINAL  Final   Organism ID, Bacteria METHICILLIN RESISTANT STAPHYLOCOCCUS AUREUS  Final   Organism ID, Bacteria STAPHYLOCOCCUS SPECIES (COAGULASE NEGATIVE)  Final      Susceptibility   Methicillin resistant staphylococcus aureus - MIC*    CIPROFLOXACIN >=8 RESISTANT Resistant     ERYTHROMYCIN >=8 RESISTANT Resistant     GENTAMICIN <=0.5 SENSITIVE Sensitive     OXACILLIN >=4 RESISTANT Resistant     TETRACYCLINE <=1 SENSITIVE Sensitive     VANCOMYCIN 1 SENSITIVE Sensitive     TRIMETH/SULFA <=10 SENSITIVE Sensitive     CLINDAMYCIN >=8 RESISTANT Resistant     RIFAMPIN <=0.5 SENSITIVE Sensitive     Inducible Clindamycin NEGATIVE Sensitive     * FEW METHICILLIN RESISTANT STAPHYLOCOCCUS AUREUS   Staphylococcus species (coagulase negative) - MIC*    CIPROFLOXACIN 4 RESISTANT Resistant     ERYTHROMYCIN >=8 RESISTANT Resistant     GENTAMICIN <=0.5 SENSITIVE Sensitive     OXACILLIN >=4 RESISTANT Resistant     TETRACYCLINE >=16 RESISTANT Resistant     VANCOMYCIN 2 SENSITIVE Sensitive     TRIMETH/SULFA >=320 RESISTANT Resistant     CLINDAMYCIN <=0.25 SENSITIVE Sensitive     RIFAMPIN <=0.5 SENSITIVE Sensitive     Inducible Clindamycin NEGATIVE Sensitive     * RARE  STAPHYLOCOCCUS SPECIES (COAGULASE NEGATIVE)  Fungus Culture With Stain     Status: None (Preliminary result)   Collection Time: 09/02/16  8:46 AM  Result Value Ref Range Status   Fungus Stain Final report  Final    Comment: (NOTE) Performed At: [facility] [place], [place] [id number] Lindon Romp MD TU:[id number]    Fungus (Mycology) Culture PENDING  Incomplete   Fungal Source TISSUE  Final    Comment: RIGHT STERNOCLAVIVULAR POF VANC AND CEFEPINE  Fungus Culture Result     Status:  None   Collection Time: 09/02/16  8:46 AM  Result Value Ref Range Status   Result 1 Comment  Final    Comment: (NOTE) KOH/Calcofluor preparation:  no fungus observed. Performed At: [facility] [address] [place], [place] [id number] Lindon Romp MD ZG:[id number]       Radiology Studies: No results found.   Scheduled Meds: Continuous Infusions: . lactated ringers 50 mL/hr at 09/07/16 1407  . vancomycin 1,250 mg (09/10/16 0417)     LOS: 10 days   Time Spent in minutes   30 minutes  Sofija Antwi D.O. on 09/10/2016 at 11:59 AM  Between 7am to 7pm - Pager - [phone]  After 7pm go to [email or web address] - password [email or web address]  And look for the night coverage person covering for me after hours  [facility] Office  [phone]

## 2016-09-10 NOTE — Progress Notes (Addendum)
      [address]       [place] [id number]             [phone]      3 Days Post-Op Procedure(s) (LRB): IRRIGATION AND DEBRIDEMENT OF CHEST WOUND, PLACEMENT OF A CELL AND VAC (Right) APPLICATION OF A-CELL OF CHEST/ABDOMEN (Right)   Subjective:  Patient having a little more pain overnight.  Objective: Vital signs in last 24 hours: Temp:  [98 F (36.7 C)-98.1 F (36.7 C)] 98 F (36.7 C) (06/01 0424) Pulse Rate:  [54-70] 54 (06/01 0424) Cardiac Rhythm: Normal sinus rhythm (06/01 0700) Resp:  [18] 18 (06/01 0424) BP: (110-130)/(59-65) 116/65 (06/01 0424) SpO2:  [100 %] 100 % (06/01 0424)  Intake/Output from previous day: 05/31 0701 - 06/01 0700 In: 740 [P.O.:720; I.V.:20] Out: 1550 [Urine:1550]  General appearance: alert, cooperative and no distress Heart: regular rate and rhythm Lungs: clear to auscultation bilaterally Abdomen: soft, non-tender; bowel sounds normal; no masses,  no organomegaly Extremities: extremities normal, atraumatic, no cyanosis or edema Wound: clean and dry  Lab Results:  Recent Labs  09/09/16 0435 09/10/16 0404  WBC 9.8 11.0*  HGB 9.4* 9.5*  HCT 30.2* 30.5*  PLT 465* 478*   BMET:  Recent Labs  09/09/16 0435 09/10/16 0404  NA 136 135  K 4.3 4.1  CL 102 101  CO2 27 27  GLUCOSE 99 107*  BUN 10 9  CREATININE 0.71 0.69  CALCIUM 8.8* 9.1    PT/INR: No results for input(s): LABPROT, INR in the last 72 hours. ABG No results found for: PHART, HCO3, TCO2, ACIDBASEDEF, O2SAT CBG (last 3)  No results for input(s): GLUCAP in the last 72 hours.  Assessment/Plan: S/P Procedure(s) (LRB): IRRIGATION AND DEBRIDEMENT OF CHEST WOUND, PLACEMENT OF A CELL AND VAC (Right) APPLICATION OF A-CELL OF CHEST/ABDOMEN (Right)  1. CV- hemodynamically stable, HR remains bradycardic 2. ID- continue Vancomycin per ID recommendations, will need 8 weeks of therapy total 3. S/P Wound debridement of SCJ with acell placement, will need  wound vac change on Monday 4. H/O IV Drug abuse- PICC line in place, patient not a candidate for home discharge 5. Dispo- patient stable for discharge from our standpoint, continue IV ABX, wound vac changes every 5 days per plastics, care per primary   LOS: 10 days    [name] 09/10/2016  Agree with above.

## 2016-09-10 NOTE — Care Management Important Message (Signed)
Important Message  Patient Details  Name: [name] MRN: [id number] Date of Birth: 07-13-1970   Medicare Important Message Given:  Yes    Carles Collet, RN 09/10/2016, 11:54 AM

## 2016-09-10 NOTE — OR Nursing (Signed)
Late entry due to delay documentation.

## 2016-09-10 NOTE — Progress Notes (Signed)
Patient wanted me to walk with her and her father to the ATM. States that he is not able to read or write and needs money. Advised that she will have to have a Dr's order to leave the floor. Also advised her earlier that she cannot take a shower at this time. Patient then wanted the NT [facility]) to walk with him to the ATM. She advised that she was unable to walk due to other Tech being on break. Will continue to monitor patient. Kept setting the IV off earlier, behavior was a little irrational. Also asked NT for room deodorizer because she and her father "were stinking."

## 2016-09-11 NOTE — Progress Notes (Signed)
Verbal order received from [place], MD to dc central line, central line dc as ordered will continue to monitor.

## 2016-09-11 NOTE — Progress Notes (Addendum)
PROGRESS NOTE    [name]  OZH:[id number] DOB: 03-20-1971 DOA: 08/31/2016 PCP: Patient, [name]   Chief Complaint  Patient presents with  . Abscess    Brief Narrative:  HPI on 08/31/2016 by Dr. Steward Ros [name] is a 46 y.o. female with medical history significant of IVDA, septic joint, septic emboli, endocardititis with recent long hospitalization in dec of 2017 for absess to chest wall with septic joint and infected valve comes in with 3 days of developing mass/absess to the same area to right chest wall with redness that has spread down to right breast with associated pain with breathing and moving right shoulder.  Pt reports when she left hospital she was sober until her mother died in 2022-05-31.  She was on sub axone in [place] but transportation became an issue, then her mom died and she has been using heroin since.  She uses at least every other day, is no longer on subaxone.   She does not use needles to her chest, only to her upper extremities.    Interim history S/p I&D of the right sternoclavicular joint by plastics. ID consulted and recommended 8 weeks of vancomycin.   Assessment & Plan   Right sternoclavicular joint abscess -Blood cultures from 08/31/2016 show no growth to date -Wound culture from 09/02/2016 showed MRSA and staph coag negative -Cardiothoracic and plastic surgery consulted appreciated -Status post excision of the right chest wound 4 x 6 x 3 cm with placement of Acell (2g powder and 7x10cm sheet), placement of VAC on 09/07/2016 by Dr. Marla Roe -Initially placed on vancomycin and cefepime -Of note, patient did have TEE on 09/02/2016 which showed no aortic vegetation and no mitral valve leaflet thickening or calcification, no PFO. EF 55-60% -Discussed antibiotic therapy with infectious disease. Dr. Baxter Flattery recommends 8 weeks of vancomycin. -Ceftriaxone discontinued -patient is not a candidate for PICC line and home discharge given her history of  IVDU -SW/CM working on dispo placement. SNF at this time is not an option given patient's past and leaving SNF AMA. Looking into LTAC placement at [facility], however, this will not occur until at least 09/13/2016 -Discussed with Dr. Marla Roe, patient will need to follow up with her in the office next week on 6/5 or 6/6  Bradycardia -Resolved -Methadone discontinued -Currently asymptomatic  Polysubstance abuse -Discussed cessation -Tox screen positive for cocaine and opiates -per RN, patient had some irrational behavior last night -Repeated urine drug screen on 09/10/2016, +cocaine -Discussed with patient, states she has "gum with cocaine and heroin in it which helps relax her."  Last usage 1-2 days ago (in the hospital) -Discussed the importance of cessation  Hepatitis C infection -Will need outpatient follow-up, possibly treatment in 6 months pending her ability to refrain from drug use. -HIV nonreactive  COPD -Currently stable, no wheezing on exam  Chronic anemia -Baseline hemoglobin between 8 and 9, currently 9.5 -Continue to monitor CBC  Thrombocytosis -Possibly reactive -Continue to monitor CBC  Social issues -patient states she lost her uncle yesterday to brain cancer and is very upset  Headache -possibly secondary to stress vs grief over uncle's passing -no longer complains of headace  DVT Prophylaxis  SCDs  Code Status: Full  Family Communication: None at bedside  Disposition Plan: Admitted. Dispo TBD- possibly LTAC on Monday 09/13/2016  Consultants Cardiothoracic surgery Plastic surgery Infectious disease  Procedures  TEE Eexcision of the right chest wound 4 x 6 x 3 cm with placement of Acell (2g powder  and 7x10cm sheet), placement of VAC  Antibiotics   Anti-infectives    Start     Dose/Rate Route Frequency Ordered Stop   09/07/16 1454  polymyxin B 500,000 Units, bacitracin 50,000 Units in sodium chloride irrigation 0.9 % 500 mL irrigation  Status:   Discontinued       As needed 09/07/16 1454 09/07/16 1517   09/04/16 1530  vancomycin (VANCOCIN) 1,250 mg in sodium chloride 0.9 % 250 mL IVPB     1,250 mg 166.7 mL/hr over 90 Minutes Intravenous Every 12 hours 09/04/16 1514     09/01/16 1400  ceFEPIme (MAXIPIME) 2 g in dextrose 5 % 50 mL IVPB  Status:  Discontinued     2 g 100 mL/hr over 30 Minutes Intravenous Every 8 hours 09/01/16 1012 09/08/16 1642   08/31/16 2300  vancomycin (VANCOCIN) IVPB 1000 mg/200 mL premix  Status:  Discontinued     1,000 mg 200 mL/hr over 60 Minutes Intravenous Every 12 hours 08/31/16 2221 09/04/16 1514   08/31/16 2300  ceFEPIme (MAXIPIME) 2 g in dextrose 5 % 50 mL IVPB  Status:  Discontinued     2 g 100 mL/hr over 30 Minutes Intravenous Every 12 hours 08/31/16 2221 09/01/16 1012   08/31/16 1445  vancomycin (VANCOCIN) IVPB 1000 mg/200 mL premix     1,000 mg 200 mL/hr over 60 Minutes Intravenous  Once 08/31/16 1438 08/31/16 1616      Subjective:   [name] seen and examined today. Denies chest pain, shortness of breath, abdominal pain. Feels sleepy today. Denies further headache.   Objective:   Vitals:   09/10/16 1016 09/10/16 2120 09/11/16 0551 09/11/16 1152  BP: 121/74 114/80 100/63 102/66  Pulse: 60 62 83 81  Resp: 18 20 18 18   Temp: 98.2 F (36.8 C) 97.5 F (36.4 C) 97.9 F (36.6 C) 98.4 F (36.9 C)  TempSrc: Oral Oral Oral Oral  SpO2: 100% 97% 98% 99%  Weight:      Height:        Intake/Output Summary (Last 24 hours) at 09/11/16 1229 Last data filed at 09/11/16 1226  Gross per 24 hour  Intake              520 ml  Output             1001 ml  Net             -481 ml   Filed Weights   08/31/16 1213 08/31/16 2222  Weight: 72.6 kg (160 lb) 74.6 kg (164 lb 6.4 oz)   Exam  General: Well developed, well nourished, NAD, appears stated age  71: NCAT, mucous membranes moist.   Neck: Supple, no JVD, no masses, L IJ in place  Chest wall: Vac in place  Neuro: AAOx3,  nonfocal  Psych: Appropriate  Data Reviewed: I have personally reviewed following labs and imaging studies  CBC:  Recent Labs Lab 09/08/16 0827 09/09/16 0435 09/10/16 0404  WBC 8.3 9.8 11.0*  HGB 9.4* 9.4* 9.5*  HCT 30.1* 30.2* 30.5*  MCV 78.4 78.4 78.6  PLT 520* 465* 474*   Basic Metabolic Panel:  Recent Labs Lab 09/04/16 1439 09/07/16 0412 09/08/16 0827 09/09/16 0435 09/10/16 0404  NA 133* 135 134* 136 135  K 3.8 4.2 4.2 4.3 4.1  CL 104 102 101 102 101  CO2 24 27 26 27 27   GLUCOSE 112* 99 169* 99 107*  BUN 9 16 12 10 9   CREATININE 0.94 0.74 0.79 0.71 0.69  CALCIUM 8.0* 8.4* 8.5* 8.8* 9.1   GFR: Estimated Creatinine Clearance: 82.9 mL/min (by C-G formula based on SCr of 0.69 mg/dL). Liver Function Tests: No results for input(s): AST, ALT, ALKPHOS, BILITOT, PROT, ALBUMIN in the last 168 hours. No results for input(s): LIPASE, AMYLASE in the last 168 hours. No results for input(s): AMMONIA in the last 168 hours. Coagulation Profile: No results for input(s): INR, PROTIME in the last 168 hours. Cardiac Enzymes: No results for input(s): CKTOTAL, CKMB, CKMBINDEX, TROPONINI in the last 168 hours. BNP (last 3 results) No results for input(s): PROBNP in the last 8760 hours. HbA1C: No results for input(s): HGBA1C in the last 72 hours. CBG: No results for input(s): GLUCAP in the last 168 hours. Lipid Profile: No results for input(s): CHOL, HDL, LDLCALC, TRIG, CHOLHDL, LDLDIRECT in the last 72 hours. Thyroid Function Tests: No results for input(s): TSH, T4TOTAL, FREET4, T3FREE, THYROIDAB in the last 72 hours. Anemia Panel: No results for input(s): VITAMINB12, FOLATE, FERRITIN, TIBC, IRON, RETICCTPCT in the last 72 hours. Urine analysis:    Component Value Date/Time   COLORURINE YELLOW 08/31/2016 2108   APPEARANCEUR CLEAR 08/31/2016 2108   LABSPEC 1.025 08/31/2016 2108   PHURINE 7.0 08/31/2016 2108   GLUCOSEU NEGATIVE 08/31/2016 2108   HGBUR SMALL (A) 08/31/2016  2108   BILIRUBINUR NEGATIVE 08/31/2016 2108   KETONESUR NEGATIVE 08/31/2016 2108   PROTEINUR NEGATIVE 08/31/2016 2108   UROBILINOGEN 0.2 10/18/2014 0100   NITRITE NEGATIVE 08/31/2016 2108   LEUKOCYTESUR SMALL (A) 08/31/2016 2108   Sepsis Labs: @LABRCNTIP (procalcitonin:4,lacticidven:4)  ) Recent Results (from the past 240 hour(s))  Aerobic/Anaerobic Culture (surgical/deep wound)     Status: None   Collection Time: 09/02/16  8:31 AM  Result Value Ref Range Status   Specimen Description ABSCESS  Final   Special Requests RIGHT STERNOCLAVICULAR POF VANC AND CEFEPINE  Final   Gram Stain   Final    FEW WBC PRESENT, PREDOMINANTLY PMN FEW GRAM POSITIVE COCCI RARE GRAM VARIABLE ROD    Culture   Final    MODERATE METHICILLIN RESISTANT STAPHYLOCOCCUS AUREUS NO ANAEROBES ISOLATED    Report Status 09/07/2016 FINAL  Final   Organism ID, Bacteria METHICILLIN RESISTANT STAPHYLOCOCCUS AUREUS  Final      Susceptibility   Methicillin resistant staphylococcus aureus - MIC*    CIPROFLOXACIN >=8 RESISTANT Resistant     ERYTHROMYCIN >=8 RESISTANT Resistant     GENTAMICIN <=0.5 SENSITIVE Sensitive     OXACILLIN >=4 RESISTANT Resistant     TETRACYCLINE <=1 SENSITIVE Sensitive     VANCOMYCIN 1 SENSITIVE Sensitive     TRIMETH/SULFA <=10 SENSITIVE Sensitive     CLINDAMYCIN >=8 RESISTANT Resistant     RIFAMPIN <=0.5 SENSITIVE Sensitive     Inducible Clindamycin NEGATIVE Sensitive     * MODERATE METHICILLIN RESISTANT STAPHYLOCOCCUS AUREUS  Fungus Culture With Stain     Status: None (Preliminary result)   Collection Time: 09/02/16  8:31 AM  Result Value Ref Range Status   Fungus Stain Final report  Final    Comment: (NOTE) Performed At: [facility] [place], [place] [id number] Lindon Romp MD [id number]    Fungus (Mycology) Culture PENDING  Incomplete   Fungal Source ABSCESS  Final    Comment: RIGHT STERNOCLAVICULAR POF VANC AND CEFEPINE  Fungus Culture Result      Status: None   Collection Time: 09/02/16  8:31 AM  Result Value Ref Range Status   Result 1 Comment  Final    Comment: (  NOTE) KOH/Calcofluor preparation:  no fungus observed. Performed At: [facility] [place], [place] [id number] Lindon Romp MD ZH:[id number]   Aerobic/Anaerobic Culture (surgical/deep wound)     Status: None   Collection Time: 09/02/16  8:46 AM  Result Value Ref Range Status   Specimen Description TISSUE  Final   Special Requests RIGHT STERNOCLAVICULAR TIS POF VANC AND CEFEPINE  Final   Gram Stain   Final    FEW WBC PRESENT, PREDOMINANTLY PMN NO ORGANISMS SEEN    Culture   Final    FEW METHICILLIN RESISTANT STAPHYLOCOCCUS AUREUS RARE STAPHYLOCOCCUS SPECIES (COAGULASE NEGATIVE) CRITICAL RESULT CALLED TO, READ BACK BY AND VERIFIED WITH: F. PASEDA RN, AT 1235 09/03/16 BY D.VANHOOK REGARDING CULTURE GROWTH NO ANAEROBES ISOLATED    Report Status 09/07/2016 FINAL  Final   Organism ID, Bacteria METHICILLIN RESISTANT STAPHYLOCOCCUS AUREUS  Final   Organism ID, Bacteria STAPHYLOCOCCUS SPECIES (COAGULASE NEGATIVE)  Final      Susceptibility   Methicillin resistant staphylococcus aureus - MIC*    CIPROFLOXACIN >=8 RESISTANT Resistant     ERYTHROMYCIN >=8 RESISTANT Resistant     GENTAMICIN <=0.5 SENSITIVE Sensitive     OXACILLIN >=4 RESISTANT Resistant     TETRACYCLINE <=1 SENSITIVE Sensitive     VANCOMYCIN 1 SENSITIVE Sensitive     TRIMETH/SULFA <=10 SENSITIVE Sensitive     CLINDAMYCIN >=8 RESISTANT Resistant     RIFAMPIN <=0.5 SENSITIVE Sensitive     Inducible Clindamycin NEGATIVE Sensitive     * FEW METHICILLIN RESISTANT STAPHYLOCOCCUS AUREUS   Staphylococcus species (coagulase negative) - MIC*    CIPROFLOXACIN 4 RESISTANT Resistant     ERYTHROMYCIN >=8 RESISTANT Resistant     GENTAMICIN <=0.5 SENSITIVE Sensitive     OXACILLIN >=4 RESISTANT Resistant     TETRACYCLINE >=16 RESISTANT Resistant     VANCOMYCIN 2 SENSITIVE Sensitive      TRIMETH/SULFA >=320 RESISTANT Resistant     CLINDAMYCIN <=0.25 SENSITIVE Sensitive     RIFAMPIN <=0.5 SENSITIVE Sensitive     Inducible Clindamycin NEGATIVE Sensitive     * RARE STAPHYLOCOCCUS SPECIES (COAGULASE NEGATIVE)  Fungus Culture With Stain     Status: None (Preliminary result)   Collection Time: 09/02/16  8:46 AM  Result Value Ref Range Status   Fungus Stain Final report  Final    Comment: (NOTE) Performed At: [facility] [place], [place] [id number] Lindon Romp MD GX:[id number]    Fungus (Mycology) Culture PENDING  Incomplete   Fungal Source TISSUE  Final    Comment: RIGHT STERNOCLAVIVULAR POF VANC AND CEFEPINE  Fungus Culture Result     Status: None   Collection Time: 09/02/16  8:46 AM  Result Value Ref Range Status   Result 1 Comment  Final    Comment: (NOTE) KOH/Calcofluor preparation:  no fungus observed. Performed At: [facility] [address] [place], [place] [id number] Lindon Romp MD JS:[id number]       Radiology Studies: No results found.   Scheduled Meds: . docusate sodium  100 mg Oral BID   Continuous Infusions: . lactated ringers 50 mL/hr at 09/07/16 1407  . vancomycin Stopped (09/11/16 0410)     LOS: 11 days   Time Spent in minutes   30 minutes  [name] D.O. on 09/11/2016 at 12:29 PM  Between 7am to 7pm - Pager - [phone]  After 7pm go to [email or web address] - password [email or web address]  And look for the night coverage person covering for me after  hours  [employer]  [phone]

## 2016-09-11 NOTE — Progress Notes (Addendum)
Pharmacy Antibiotic Note [name] is a 46 y.o. female admitted on 08/31/2016 with recurrent MRSA  abscess/septic joint. 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 S/p debridement and placement of Acell and VAC on 5/29 by plastics. Plans noted for a total of 8 weeks antibiotic (currently D10 (start day 5/22) -SCr= 0.69 and CrCl ~ 80    Plan: -Continue vancomycin 1250 mg IV q12h -Plan is for 8 weeks total of therapy (last day of therapy 10/26/16?) -Monitor renal function and will recheck a vancomycin trough later this week   Height: 5\' 1"  (154.9 cm) Weight: 164 lb 6.4 oz (74.6 kg) IBW/kg (Calculated) : 47.8  Temp (24hrs), Avg:97.7 F (36.5 C), Min:97.5 F (36.4 C), Max:97.9 F (36.6 C)   Recent Labs Lab 09/04/16 1438 09/04/16 1439 09/07/16 0412 09/08/16 0827 09/08/16 1542 09/09/16 0435 09/10/16 0404  WBC  --   --   --  8.3  --  9.8 11.0*  CREATININE  --  0.94 0.74 0.79  --  0.71 0.69  VANCOTROUGH 12*  --   --   --  15  --   --     Estimated Creatinine Clearance: 82.9 mL/min (by C-G formula based on SCr of 0.69 mg/dL).    Allergies  Allergen Reactions  . Mango Flavor Hives and Swelling  . Doxycycline Other (See Comments)    "BURNS" >> ? INCREASED SENSITIVITY TO SUNLIGHT ?  . Pyridium [Phenazopyridine Hcl] Other (See Comments)    Irritation to skin  . Tramadol Nausea And Vomiting   Antimicrobials this admission: 5/22 vanc >>  5/22 cefepime >> 5/30  5/26 VT: 12 5/30 VT: 15  Microbiology results: 5/22 BCx: ngF 5/24 wound abscess: MRSA 5/24 fungal cx: ip 5/22 MRSA PCR: neg   Hildred Laser, Pharm D 09/11/2016 10:48 AM

## 2016-09-11 NOTE — Progress Notes (Signed)
      [address] VillageSuite [address]       [employer] [address]             [phone]      4 Days Post-Op Procedure(s) (LRB): IRRIGATION AND DEBRIDEMENT OF CHEST WOUND, PLACEMENT OF A CELL AND VAC (Right) APPLICATION OF A-CELL OF CHEST/ABDOMEN (Right) Subjective: Feels okay this morning, shares that she is tired but really hasn't slept before today   Objective: Vital signs in last 24 hours: Temp:  [97.5 F (36.4 C)-98.2 F (36.8 C)] 97.9 F (36.6 C) (06/02 0551) Pulse Rate:  [60-83] 83 (06/02 0551) Cardiac Rhythm: Normal sinus rhythm (06/02 0700) Resp:  [18-20] 18 (06/02 0551) BP: (100-121)/(63-80) 100/63 (06/02 0551) SpO2:  [97 %-100 %] 98 % (06/02 0551)     Intake/Output from previous day: 06/01 0701 - 06/02 0700 In: 1230 [P.O.:720; I.V.:10; IV Piggyback:500] Out: 1601 [Urine:1600; Stool:1] Intake/Output this shift: No intake/output data recorded.  General appearance: alert, cooperative and no distress Heart: regular rate and rhythm, S1, S2 normal, no murmur, click, rub or gallop Lungs: clear to auscultation bilaterally Abdomen: diminished bowel sounds Extremities: extremities normal, atraumatic, no cyanosis or edema Wound: wound vac with good suction  Lab Results:  Recent Labs  09/09/16 0435 09/10/16 0404  WBC 9.8 11.0*  HGB 9.4* 9.5*  HCT 30.2* 30.5*  PLT 465* 478*   BMET:  Recent Labs  09/09/16 0435 09/10/16 0404  NA 136 135  K 4.3 4.1  CL 102 101  CO2 27 27  GLUCOSE 99 107*  BUN 10 9  CREATININE 0.71 0.69  CALCIUM 8.8* 9.1    PT/INR: No results for input(s): LABPROT, INR in the last 72 hours. ABG No results found for: PHART, HCO3, TCO2, ACIDBASEDEF, O2SAT CBG (last 3)  No results for input(s): GLUCAP in the last 72 hours.  Assessment/Plan: S/P Procedure(s) (LRB): IRRIGATION AND DEBRIDEMENT OF CHEST WOUND, PLACEMENT OF A CELL AND VAC (Right) APPLICATION OF A-CELL OF CHEST/ABDOMEN (Right)  1. CV- hemodynamically stable, HR NSR in the  70s 2. ID- continue Vancomycin per ID recommendations, will need 8 weeks of therapy total. No recent fevers. WBC 11.0 yesterday 3. S/P Wound debridement of SCJ with acell placement, will need wound vac change on Monday 6/4 4. H/O IV Drug abuse- PICC line in place, patient not a candidate for home discharge, possible LTAC placement per primary 5. Dispo- patient stable for discharge from our standpoint, continue IV ABX, wound vac changes every 5 days per plastics, care per primary.   LOS: 11 days    Elgie Collard 09/11/2016

## 2016-09-12 LAB — CBC
HCT: 30 % — ABNORMAL LOW (ref 36.0–46.0)
Hemoglobin: 9.2 g/dL — ABNORMAL LOW (ref 12.0–15.0)
MCH: 24 pg — ABNORMAL LOW (ref 26.0–34.0)
MCHC: 30.7 g/dL (ref 30.0–36.0)
MCV: 78.3 fL (ref 78.0–100.0)
Platelets: 358 10*3/uL (ref 150–400)
RBC: 3.83 MIL/uL — ABNORMAL LOW (ref 3.87–5.11)
RDW: 18.7 % — ABNORMAL HIGH (ref 11.5–15.5)
WBC: 12 10*3/uL — ABNORMAL HIGH (ref 4.0–10.5)

## 2016-09-12 MED ORDER — ALUM & MAG HYDROXIDE-SIMETH 200-200-20 MG/5ML PO SUSP
15.0000 mL | Freq: Once | ORAL | Status: AC
  Administered 2016-09-12: 15 mL via ORAL
  Filled 2016-09-12: qty 30

## 2016-09-12 NOTE — Progress Notes (Signed)
PHARMACY CONSULT NOTE FOR:  OUTPATIENT  PARENTERAL ANTIBIOTIC THERAPY (OPAT)  Indication: MRSA joint abscess Regimen: vancomycin 1250mg  IV q12h End date: 10/26/2016  IV antibiotic discharge orders are pended. To discharging provider:  please sign these orders via discharge navigator,  Select New Orders & click on the button choice - Manage This Unsigned Work.     Thank you for allowing pharmacy to be a part of this patient's care.  Hildred Laser, Pharm D 09/12/2016 11:01 AM

## 2016-09-12 NOTE — Progress Notes (Signed)
PROGRESS NOTE    [name]  KCL:[id number] DOB: [birth year] DOA: 08/31/2016 PCP: Patient, [name]   Chief Complaint  Patient presents with  . Abscess    Brief Narrative:  HPI on 08/31/2016 by Dr. Steward Ros [name] is a 46 y.o. female with medical history significant of IVDA, septic joint, septic emboli, endocardititis with recent long hospitalization in dec of 2017 for absess to chest wall with septic joint and infected valve comes in with 3 days of developing mass/absess to the same area to right chest wall with redness that has spread down to right breast with associated pain with breathing and moving right shoulder.  Pt reports when she left hospital she was sober until her mother died in 05-29-2022.  She was on sub axone in [place] but transportation became an issue, then her mom died and she has been using heroin since.  She uses at least every other day, is no longer on subaxone.   She does not use needles to her chest, only to her upper extremities.    Interim history S/p I&D of the right sternoclavicular joint by plastics. ID consulted and recommended 8 weeks of vancomycin.   Assessment & Plan   Right sternoclavicular joint abscess -Blood cultures from 08/31/2016 show no growth to date -Wound culture from 09/02/2016 showed MRSA and staph coag negative -Cardiothoracic and plastic surgery consulted appreciated -Status post excision of the right chest wound 4 x 6 x 3 cm with placement of Acell (2g powder and 7x10cm sheet), placement of VAC on 09/07/2016 by Dr. Marla Roe -Initially placed on vancomycin and cefepime -Of note, patient did have TEE on 09/02/2016 which showed no aortic vegetation and no mitral valve leaflet thickening or calcification, no PFO. EF 55-60% -Discussed antibiotic therapy with infectious disease. Dr. Baxter Flattery recommends 8 weeks of vancomycin. -Ceftriaxone discontinued -patient is not a candidate for PICC line and home discharge given her history of  IVDU -SW/CM working on dispo placement. SNF at this time is not an option given patient's past and leaving SNF AMA. Looking into LTAC placement at [place], however, this will not occur until at least 09/13/2016 -Discussed with Dr. Marla Roe, patient will need to follow up with her in the office next week on 6/5 or 6/6  Bradycardia -Resolved -Methadone discontinued -Currently asymptomatic  Polysubstance abuse -Discussed cessation -Tox screen positive for cocaine and opiates -per RN, patient had some irrational behavior last night -Repeated urine drug screen on 09/10/2016, +cocaine -Discussed with patient, states she has "gum with cocaine and heroin in it which helps relax her."  Last usage 1-2 days ago (in the hospital) -Discussed the importance of cessation -Patient tells me today, 09/12/16, she cleaned out her purse and found needles which she disposed of in the sharps container, and got rid of "stuff" she found. -She wants to get back on suboxone   Hepatitis C infection -Will need outpatient follow-up, possibly treatment in 6 months pending her ability to refrain from drug use. -HIV nonreactive  COPD -Currently stable, no wheezing on exam  Chronic anemia -Baseline hemoglobin between 8 and 9, currently 9.2 -Continue to monitor CBC  Thrombocytosis -Possibly reactive, resolved  Social issues -patient states she lost her uncle a few days ago to brain cancer   Headache -possibly secondary to stress vs grief over uncle's passing -no longer complains of headace  DVT Prophylaxis  SCDs  Code Status: Full  Family Communication: None at bedside  Disposition Plan: Admitted. Dispo TBD- possibly LTAC  on Monday 09/13/2016  Consultants Cardiothoracic surgery Plastic surgery Infectious disease  Procedures  TEE Eexcision of the right chest wound 4 x 6 x 3 cm with placement of Acell (2g powder and 7x10cm sheet), placement of VAC  Antibiotics   Anti-infectives    Start     Dose/Rate  Route Frequency Ordered Stop   09/07/16 1454  polymyxin B 500,000 Units, bacitracin 50,000 Units in sodium chloride irrigation 0.9 % 500 mL irrigation  Status:  Discontinued       As needed 09/07/16 1454 09/07/16 1517   09/04/16 1530  vancomycin (VANCOCIN) 1,250 mg in sodium chloride 0.9 % 250 mL IVPB     1,250 mg 166.7 mL/hr over 90 Minutes Intravenous Every 12 hours 09/04/16 1514     09/01/16 1400  ceFEPIme (MAXIPIME) 2 g in dextrose 5 % 50 mL IVPB  Status:  Discontinued     2 g 100 mL/hr over 30 Minutes Intravenous Every 8 hours 09/01/16 1012 09/08/16 1642   08/31/16 2300  vancomycin (VANCOCIN) IVPB 1000 mg/200 mL premix  Status:  Discontinued     1,000 mg 200 mL/hr over 60 Minutes Intravenous Every 12 hours 08/31/16 2221 09/04/16 1514   08/31/16 2300  ceFEPIme (MAXIPIME) 2 g in dextrose 5 % 50 mL IVPB  Status:  Discontinued     2 g 100 mL/hr over 30 Minutes Intravenous Every 12 hours 08/31/16 2221 09/01/16 1012   08/31/16 1445  vancomycin (VANCOCIN) IVPB 1000 mg/200 mL premix     1,000 mg 200 mL/hr over 60 Minutes Intravenous  Once 08/31/16 1438 08/31/16 1616      Subjective:   [name] seen and examined today. Feels tired but better. Ready to get out of bed. Wishes she could go walk around. Denies chest pain, shortness of breath, abdominal pain, N/V. Feels some soreness in her shoulders.  Objective:   Vitals:   09/11/16 1152 09/11/16 1310 09/11/16 2115 09/12/16 0503  BP: 102/66 (!) 110/57 104/61 (!) 112/51  Pulse: 81 81 67 92  Resp: 18 18 17 18   Temp: 98.4 F (36.9 C) 99.1 F (37.3 C) 99.6 F (37.6 C) 99.1 F (37.3 C)  TempSrc: Oral Oral Oral Oral  SpO2: 99% 98% 98% 100%  Weight:      Height:        Intake/Output Summary (Last 24 hours) at 09/12/16 0951 Last data filed at 09/12/16 0400  Gross per 24 hour  Intake              990 ml  Output             1500 ml  Net             -510 ml   Filed Weights   08/31/16 1213 08/31/16 2222  Weight: 72.6 kg (160 lb)  74.6 kg (164 lb 6.4 oz)   Exam  General: Well developed, well nourished, no acute distress  HEENT: NCAT, mucous membranes moist.   Chest wall: wound vac in place  Neuro: AAOx3, nonfocal  Psych: Normal affect and demeanor   Data Reviewed: I have personally reviewed following labs and imaging studies  CBC:  Recent Labs Lab 09/08/16 0827 09/09/16 0435 09/10/16 0404 09/12/16 0156  WBC 8.3 9.8 11.0* 12.0*  HGB 9.4* 9.4* 9.5* 9.2*  HCT 30.1* 30.2* 30.5* 30.0*  MCV 78.4 78.4 78.6 78.3  PLT 520* 465* 478* 573   Basic Metabolic Panel:  Recent Labs Lab 09/07/16 0412 09/08/16 0827 09/09/16 0435 09/10/16 0404  NA 135 134* 136 135  K 4.2 4.2 4.3 4.1  CL 102 101 102 101  CO2 27 26 27 27   GLUCOSE 99 169* 99 107*  BUN 16 12 10 9   CREATININE 0.74 0.79 0.71 0.69  CALCIUM 8.4* 8.5* 8.8* 9.1   GFR: Estimated Creatinine Clearance: 82.9 mL/min (by C-G formula based on SCr of 0.69 mg/dL). Liver Function Tests: No results for input(s): AST, ALT, ALKPHOS, BILITOT, PROT, ALBUMIN in the last 168 hours. No results for input(s): LIPASE, AMYLASE in the last 168 hours. No results for input(s): AMMONIA in the last 168 hours. Coagulation Profile: No results for input(s): INR, PROTIME in the last 168 hours. Cardiac Enzymes: No results for input(s): CKTOTAL, CKMB, CKMBINDEX, TROPONINI in the last 168 hours. BNP (last 3 results) No results for input(s): PROBNP in the last 8760 hours. HbA1C: No results for input(s): HGBA1C in the last 72 hours. CBG: No results for input(s): GLUCAP in the last 168 hours. Lipid Profile: No results for input(s): CHOL, HDL, LDLCALC, TRIG, CHOLHDL, LDLDIRECT in the last 72 hours. Thyroid Function Tests: No results for input(s): TSH, T4TOTAL, FREET4, T3FREE, THYROIDAB in the last 72 hours. Anemia Panel: No results for input(s): VITAMINB12, FOLATE, FERRITIN, TIBC, IRON, RETICCTPCT in the last 72 hours. Urine analysis:    Component Value Date/Time    COLORURINE YELLOW 08/31/2016 2108   APPEARANCEUR CLEAR 08/31/2016 2108   LABSPEC 1.025 08/31/2016 2108   PHURINE 7.0 08/31/2016 2108   GLUCOSEU NEGATIVE 08/31/2016 2108   HGBUR SMALL (A) 08/31/2016 2108   BILIRUBINUR NEGATIVE 08/31/2016 2108   KETONESUR NEGATIVE 08/31/2016 2108   PROTEINUR NEGATIVE 08/31/2016 2108   UROBILINOGEN 0.2 10/18/2014 0100   NITRITE NEGATIVE 08/31/2016 2108   LEUKOCYTESUR SMALL (A) 08/31/2016 2108   Sepsis Labs: @LABRCNTIP (procalcitonin:4,lacticidven:4)  ) No results found for this or any previous visit (from the past 240 hour(s)).    Radiology Studies: No results found.   Scheduled Meds: . docusate sodium  100 mg Oral BID   Continuous Infusions: . lactated ringers 50 mL/hr at 09/07/16 1407  . vancomycin Stopped (09/12/16 0518)     LOS: 12 days   Time Spent in minutes   30 minutes  Arlin Sass D.O. on 09/12/2016 at 9:51 AM  Between 7am to 7pm - Pager - [phone]  After 7pm go to [email or web address] - password [email or web address]  And look for the night coverage person covering for me after hours  [facility] Office  [phone]

## 2016-09-12 NOTE — Progress Notes (Signed)
[facility] for Infectious Disease    Date of Admission:  08/31/2016   Total days of antibiotics 10        Day 10 vanco           ID: [name] is a 46 y.o. female with hx of IVDU, with MRSA right septic arthritis of SCJ s/p debridement and wound vac Principal Problem:   Septic arthritis of right sternoclavicular joint (HCC) Active Problems:   Polysubstance abuse   IV drug abuse   Hepatitis C infection   COPD (chronic obstructive pulmonary disease) (HCC)   Abscess of chest ([place])   Endocarditis of tricuspid valve   Joint infection ([place])    Subjective: afebrile  Medications:  . docusate sodium  100 mg Oral BID    Objective: Vital signs in last 24 hours: Temp:  [99.1 F (37.3 C)-99.6 F (37.6 C)] 99.1 F (37.3 C) (06/03 0503) Pulse Rate:  [67-92] 92 (06/03 0503) Resp:  [17-18] 18 (06/03 0503) BP: (104-112)/(51-61) 112/51 (06/03 0503) SpO2:  [98 %-100 %] 100 % (06/03 0503) Physical Exam  Constitutional:  oriented to person, place, and time. appears well-developed and well-nourished. No distress.  HENT: [place]/AT, PERRLA, no scleral icterus Mouth/Throat: Oropharynx is clear and moist. No oropharyngeal exudate. Poor dentition Chest wall - right sided wound vac at SCJ Pulmonary/Chest: Effort normal and breath sounds normal. No respiratory distress.  has no wheezes.  Neck = supple, left ij pulled, no rash  Skin: Skin is warm and dry. No rash noted. No erythema. Multiple scar from prior injection site Psychiatric: a normal mood and affect.  behavior is normal.   Lab Results Lab Results  Component Value Date   WBC 12.0 (H) 09/12/2016   HGB 9.2 (L) 09/12/2016   HCT 30.0 (L) 09/12/2016   MCV 78.3 09/12/2016   PLT 358 09/12/2016   BMET    Component Value Date/Time   NA 135 09/10/2016 0404   K 4.1 09/10/2016 0404   CL 101 09/10/2016 0404   CO2 27 09/10/2016 0404   GLUCOSE 107 (H) 09/10/2016 0404   BUN 9 09/10/2016 0404   CREATININE 0.69 09/10/2016 0404   CALCIUM 9.1 09/10/2016 0404   GFRNONAA >60 09/10/2016 0404   GFRAA >60 09/10/2016 0404     Microbiology: 5/24 tissue cx MRSA plus CoNs 5/24 tissue cx MRSA Studies/Results: TEE 5/24  Aortic valve: No AV vegetation.  Mitral valve: No leaflet thickening and calcification present.  Right ventricle: Normal cavity size, wall thickness and ejection fraction.  Septum: No Patent Foramen Ovale present.  Left atrium: Patent foramen ovale not present.  Pulmonic valve: Trace regurgitation.  Left ventricle: The LV diameter was at the upper limits of normal and measured 5.3 cm at end-diastole at the mid-papillary level in the transgastric short-axis view. There was normal LV systolic function, the ejection fraction was estimated at 55-60% using the Simpson's method in the 4 and 2 chamber views. There were no regional wall motion abnormalities.  Left atrium: No spontaneous echo contrast.   5/22 chest CT IMPRESSION: 1. Large region of phlegmon and septic effusion again noted at the right sternoclavicular and right first costomanubrial joints. New air within this collection likely reflects interval drainage of the superficial abscess. The phlegmon measures 8-9 cm, with underlying mild bony destruction and diffuse sclerosis again noted. Sclerosis involves the right clavicle, right first rib and right side of the sternum. Mild extension of phlegmon to the superior aspect of the mediastinum. 2. Mild  right-sided airspace opacity may reflect atelectasis or possibly mild pneumonia, including mild opacity underlying the right anterior chest wall phlegmon. 3. Vague soft tissue inflammation tracking about the right breast, reflecting cellulitis. 4. Mild asymmetric prominence of right axillary nodes, without significant axillary lymphadenopathy. 5. Mild splenomegaly.   Assessment/Plan: CoNS & MRSA right SCJ septic arthritis/osteo = plan to treat for 8 wk of IV vancomycin, using 5/25 as day 1.  Currently day 10 of 56.  - will check sed rate and crp as part of baseline monitoring - will need twice a week monitoring of vancomycin, is therapeutic since vanco level if 15 on 5/30 - CoNS also found on culture but only 1 of 2 - S to vanco and S to rif, to adequately treat CoNS would need to continue vancomycin  - can consider giving oral linezolid 600mg  BID to treat the rest of her infection though she would need to make sure to have follow up every 1-2 wk to check CBC for platelet monitoring  IVDU = high risk for relapse if she goes home to use IVDU. Thus unsafe to deliver treatment. Ideally would have her go to SNF for further management  Will sign off. Please call when she discharges. Will set up appt for 4 wk in ID clinic   [facility], [facility] for Infectious Diseases Cell: [phone] Pager: [phone]  09/12/2016, 12:01 PM

## 2016-09-12 NOTE — Progress Notes (Signed)
      [id number]       [name],[name] [id number]             [phone]      5 Days Post-Op Procedure(s) (LRB): IRRIGATION AND DEBRIDEMENT OF CHEST WOUND, PLACEMENT OF A CELL AND VAC (Right) APPLICATION OF A-CELL OF CHEST/ABDOMEN (Right) Subjective: Asking if she can go down to the cafeteria to eat. No other issues  Objective: Vital signs in last 24 hours: Temp:  [98.4 F (36.9 C)-99.6 F (37.6 C)] 99.1 F (37.3 C) (06/03 0503) Pulse Rate:  [67-92] 92 (06/03 0503) Cardiac Rhythm: Normal sinus rhythm (06/03 0700) Resp:  [17-18] 18 (06/03 0503) BP: (102-112)/(51-66) 112/51 (06/03 0503) SpO2:  [98 %-100 %] 100 % (06/03 0503)     Intake/Output from previous day: 06/02 0701 - 06/03 0700 In: 1065 [P.O.:555; I.V.:10; IV Piggyback:500] Out: 1500 [Urine:1500] Intake/Output this shift: No intake/output data recorded.  General appearance: alert, cooperative and no distress Heart: regular rate and rhythm, S1, S2 normal, no murmur, click, rub or gallop Lungs: clear to auscultation bilaterally Abdomen: soft, non-tender; bowel sounds normal; no masses,  no organomegaly Extremities: extremities normal, atraumatic, no cyanosis or edema Wound: wound vac in place with good suction  Lab Results:  Recent Labs  09/10/16 0404 09/12/16 0156  WBC 11.0* 12.0*  HGB 9.5* 9.2*  HCT 30.5* 30.0*  PLT 478* 358   BMET:  Recent Labs  09/10/16 0404  NA 135  K 4.1  CL 101  CO2 27  GLUCOSE 107*  BUN 9  CREATININE 0.69  CALCIUM 9.1    PT/INR: No results for input(s): LABPROT, INR in the last 72 hours. ABG No results found for: PHART, HCO3, TCO2, ACIDBASEDEF, O2SAT CBG (last 3)  No results for input(s): GLUCAP in the last 72 hours.  Assessment/Plan: S/P Procedure(s) (LRB): IRRIGATION AND DEBRIDEMENT OF CHEST WOUND, PLACEMENT OF A CELL AND VAC (Right) APPLICATION OF A-CELL OF CHEST/ABDOMEN (Right)  1. CV- hemodynamically stable, HR NSR in the 70s 2. ID- continue  Vancomycin per ID recommendations, will need 8 weeks of therapy total. No recent fevers. WBC 12.0 today 3. S/P Wound debridement of SCJ with acell placement, will need wound vac change on Monday 6/4 4. H/O IV Drug abuse- PICC line in place, patient not a candidate for home discharge, possible LTAC placement per primary 5. Dispo- patient stable for discharge from our standpoint, continue IV ABX, wound vac changes every 5 days per plastics, care per primary. She is wanting to go down to the cafeteria to get lunch. I told her that would likely not be possible since she is on a telemetry unit which requires her to be on tele 24/7.    LOS: 12 days    Elgie Collard 09/12/2016

## 2016-09-13 LAB — BASIC METABOLIC PANEL
Anion gap: 7 (ref 5–15)
BUN: 20 mg/dL (ref 6–20)
CO2: 24 mmol/L (ref 22–32)
Calcium: 8.7 mg/dL — ABNORMAL LOW (ref 8.9–10.3)
Chloride: 105 mmol/L (ref 101–111)
Creatinine, Ser: 0.71 mg/dL (ref 0.44–1.00)
GFR calc Af Amer: >60 mL/min (ref >60)
GFR calc non Af Amer: >60 mL/min (ref >60)
Glucose, Bld: 101 mg/dL — ABNORMAL HIGH (ref 65–99)
Potassium: 4.3 mmol/L (ref 3.5–5.1)
Sodium: 136 mmol/L (ref 135–145)

## 2016-09-13 MED ORDER — DOCUSATE SODIUM 100 MG PO CAPS
100.0000 mg | ORAL_CAPSULE | Freq: Two times a day (BID) | ORAL | Status: DC | PRN
Start: 2016-09-13 — End: 2016-09-19

## 2016-09-13 MED ORDER — ALUM & MAG HYDROXIDE-SIMETH 200-200-20 MG/5ML PO SUSP
15.0000 mL | Freq: Four times a day (QID) | ORAL | Status: DC | PRN
  Administered 2016-09-13 – 2016-09-17 (×2): 15 mL via ORAL
  Filled 2016-09-13 (×3): qty 30

## 2016-09-13 NOTE — Care Management Note (Signed)
Case Management Note Marvetta Gibbons RN, BSN Unit 2W-Case Manager [phone]  Patient Details  Name: [name] MRN: [id number] Date of Birth: 16-Sep-1970  Subjective/Objective:  Pt admitted  Septic arthritis, sternoclavicular joint s/p I&D on 5/24- with wound VAC placed-hx of IVDU                Action/Plan: PTA pt lived at home- on last admit was set up with [facility]- pt has also been sent to SNF in past for IV abx needs- If pt is to need long term iv abx again- will need to look at SNF option- with IVDU hx not a candidate for PICC at home- will also need wound VAC for d/c. CM and CSW to follow for d/c needs  Expected Discharge Date:                  Expected Discharge Plan:     In-House Referral:  Clinical Social Work  Discharge planning Services  CM Consult  Post Acute Care Choice:    Choice offered to:     DME Arranged:    DME Agency:     HH Arranged:    [place] Agency:     Status of Service:  In process, will continue to follow  If discussed at Long Length of Stay Meetings, dates discussed:    Discharge Disposition:   Additional Comments:  09/13/16- 1500- Marvetta Gibbons RN, CM- per notes by CM last week- pt was referred to [facility] did not have bed availability and was to review case again today- have spoken with [place]- who after review of pt's chart today -states that pt is not a candidate for [facility] and they will not be making bed offer (secondary to pt's high risk behavior) - have notified MD- and have placed call to Dr. Reynaldo Minium- medical director- for d/c plan direction-awaiting return call.   Dawayne Patricia, RN 09/13/2016, 4:19 PM

## 2016-09-13 NOTE — Progress Notes (Addendum)
PROGRESS NOTE    [name]  IOE:[id number] DOB: 07/12/1970 DOA: 08/31/2016 PCP: [name], [name]   Chief Complaint  [name] presents with  . Abscess    Brief Narrative:  HPI on 08/31/2016 by Dr. Steward Ros [name] is a 46 y.o. female with medical history significant of IVDA, septic joint, septic emboli, endocardititis with recent long hospitalization in dec of 2017 for absess to chest wall with septic joint and infected valve comes in with 3 days of developing mass/absess to the same area to right chest wall with redness that has spread down to right breast with associated pain with breathing and moving right shoulder.  Pt reports when she left hospital she was sober until her mother died in May 13, 2022.  She was on sub axone in [place] but transportation became an issue, then her mom died and she has been using heroin since.  She uses at least every other day, is no longer on subaxone.   She does not use needles to her chest, only to her upper extremities.    Interim history S/p I&D of the right sternoclavicular joint by plastics. ID consulted and recommended 8 weeks of vancomycin.   Assessment & Plan   Right sternoclavicular joint abscess -Blood cultures from 08/31/2016 show no growth to date -Wound culture from 09/02/2016 showed MRSA and staph coag negative -Cardiothoracic and plastic surgery consulted appreciated -Status post excision of the right chest wound 4 x 6 x 3 cm with placement of Acell (2g powder and 7x10cm sheet), placement of VAC on 09/07/2016 by Dr. Marla Roe -Initially placed on vancomycin and cefepime -Of note, [name] did have TEE on 09/02/2016 which showed no aortic vegetation and no mitral valve leaflet thickening or calcification, no PFO. EF 55-60% -Discussed antibiotic therapy with infectious disease. Dr. Baxter Flattery recommends 8 weeks of vancomycin. Day 1 on 09/03/16. Treatment through 10/30/16. -Ceftriaxone discontinued -[name] is not a candidate for PICC line  and home discharge given her history of IVDU -Currently using peripheral IV, removed central line on 6/2 -SW/CM working on dispo placement. SNF at this time is not an option given [name]'s past and leaving SNF AMA. Looking into LTAC placement at [facility]- pending review -Discussed with Dr. Marla Roe, [name] will need to follow up with her in the office next week on 6/5 or 6/6 -Plan for wound vac change on 09/14/2016  Bradycardia -Resolved -Methadone discontinued -Currently asymptomatic  Polysubstance abuse -Discussed cessation -Tox screen positive for cocaine and opiates -per RN, [name] had some irrational behavior last night -Repeated urine drug screen on 09/10/2016, +cocaine -Discussed with [name], states she has "gum with cocaine and heroin in it which helps relax her."  Last usage 1-2 days ago (in the hospital) -Discussed the importance of cessation -[name] tells me on 09/12/16, she cleaned out her purse and found needles which she disposed of in the sharps container, and got rid of "stuff" she found. -She wants to get back on suboxone   Hepatitis C infection -Will need outpatient follow-up, possibly treatment in 6 months pending her ability to refrain from drug use. -HIV nonreactive  COPD -Currently stable, no wheezing on exam  Chronic anemia -Baseline hemoglobin between 8 and 9 -Continue to monitor CBC  Thrombocytosis -Possibly reactive, resolved  Social issues -[name] states she lost her uncle a few days ago to brain cancer   Headache -possibly secondary to stress vs grief over uncle's passing -no longer complains of headache  DVT Prophylaxis  SCDs  Code Status: Full  Family Communication: None at bedside  Disposition Plan: Admitted. Dispo TBD- possibly LTAC. Not sure if [name] will need to stay admitted to complete her antibiotics course.   Consultants Cardiothoracic surgery Plastic surgery Infectious disease  Procedures  TEE Eexcision of the right  chest wound 4 x 6 x 3 cm with placement of Acell (2g powder and 7x10cm sheet), placement of VAC  Antibiotics   Anti-infectives    Start     Dose/Rate Route Frequency Ordered Stop   09/07/16 1454  polymyxin B 500,000 Units, bacitracin 50,000 Units in sodium chloride irrigation 0.9 % 500 mL irrigation  Status:  Discontinued       As needed 09/07/16 1454 09/07/16 1517   09/04/16 1530  vancomycin (VANCOCIN) 1,250 mg in sodium chloride 0.9 % 250 mL IVPB     1,250 mg 166.7 mL/hr over 90 Minutes Intravenous Every 12 hours 09/04/16 1514     09/01/16 1400  ceFEPIme (MAXIPIME) 2 g in dextrose 5 % 50 mL IVPB  Status:  Discontinued     2 g 100 mL/hr over 30 Minutes Intravenous Every 8 hours 09/01/16 1012 09/08/16 1642   08/31/16 2300  vancomycin (VANCOCIN) IVPB 1000 mg/200 mL premix  Status:  Discontinued     1,000 mg 200 mL/hr over 60 Minutes Intravenous Every 12 hours 08/31/16 2221 09/04/16 1514   08/31/16 2300  ceFEPIme (MAXIPIME) 2 g in dextrose 5 % 50 mL IVPB  Status:  Discontinued     2 g 100 mL/hr over 30 Minutes Intravenous Every 12 hours 08/31/16 2221 09/01/16 1012   08/31/16 1445  vancomycin (VANCOCIN) IVPB 1000 mg/200 mL premix     1,000 mg 200 mL/hr over 60 Minutes Intravenous  Once 08/31/16 1438 08/31/16 1616      Subjective:   [name] seen and examined today. [name] would like to walk around the hospital, possibly go to the cafeteria as her appetite is "great". Denies chest pain, shortness of breath, abdominal pain, N/V/D/C. States she has had several bowel movements.   Objective:   Vitals:   09/12/16 1419 09/12/16 2054 09/13/16 0345 09/13/16 1154  BP: 108/61 122/87 107/61 118/68  Pulse: 71 71 66 62  Resp: 18 17 18 18   Temp: 98.5 F (36.9 C) 98 F (36.7 C) 98.2 F (36.8 C)   TempSrc: Oral Oral Oral   SpO2: 99% 92% 100% 100%  Weight:      Height:        Intake/Output Summary (Last 24 hours) at 09/13/16 1440 Last data filed at 09/13/16 0458  Gross per 24 hour    Intake              500 ml  Output              400 ml  Net              100 ml   Filed Weights   08/31/16 1213 08/31/16 2222  Weight: 72.6 kg (160 lb) 74.6 kg (164 lb 6.4 oz)   Exam  General: Well developed, well nourished, NAD, appears stated age  HEENT: NCAT, mucous membranes moist.   Cardiovascular: S1 S2 auscultated, no rubs, murmurs or gallops. Regular rate and rhythm.  Chest wall: wound vac in place  Respiratory: Clear to auscultation bilaterally with equal chest rise  Abdomen: Soft, nontender, nondistended, + bowel sounds  Extremities: warm dry without cyanosis clubbing or edema  Neuro: AAOx3, nonfocal  Psych: Appropriate  Data Reviewed: I have personally reviewed following  labs and imaging studies  CBC:  Recent Labs Lab 09/08/16 0827 09/09/16 0435 09/10/16 0404 09/12/16 0156  WBC 8.3 9.8 11.0* 12.0*  HGB 9.4* 9.4* 9.5* 9.2*  HCT 30.1* 30.2* 30.5* 30.0*  MCV 78.4 78.4 78.6 78.3  PLT 520* 465* 478* 415   Basic Metabolic Panel:  Recent Labs Lab 09/07/16 0412 09/08/16 0827 09/09/16 0435 09/10/16 0404 09/13/16 0543  NA 135 134* 136 135 136  K 4.2 4.2 4.3 4.1 4.3  CL 102 101 102 101 105  CO2 27 26 27 27 24   GLUCOSE 99 169* 99 107* 101*  BUN 16 12 10 9 20   CREATININE 0.74 0.79 0.71 0.69 0.71  CALCIUM 8.4* 8.5* 8.8* 9.1 8.7*   GFR: Estimated Creatinine Clearance: 82.9 mL/min (by C-G formula based on SCr of 0.71 mg/dL). Liver Function Tests: No results for input(s): AST, ALT, ALKPHOS, BILITOT, PROT, ALBUMIN in the last 168 hours. No results for input(s): LIPASE, AMYLASE in the last 168 hours. No results for input(s): AMMONIA in the last 168 hours. Coagulation Profile: No results for input(s): INR, PROTIME in the last 168 hours. Cardiac Enzymes: No results for input(s): CKTOTAL, CKMB, CKMBINDEX, TROPONINI in the last 168 hours. BNP (last 3 results) No results for input(s): PROBNP in the last 8760 hours. HbA1C: No results for input(s): HGBA1C  in the last 72 hours. CBG: No results for input(s): GLUCAP in the last 168 hours. Lipid Profile: No results for input(s): CHOL, HDL, LDLCALC, TRIG, CHOLHDL, LDLDIRECT in the last 72 hours. Thyroid Function Tests: No results for input(s): TSH, T4TOTAL, FREET4, T3FREE, THYROIDAB in the last 72 hours. Anemia Panel: No results for input(s): VITAMINB12, FOLATE, FERRITIN, TIBC, IRON, RETICCTPCT in the last 72 hours. Urine analysis:    Component Value Date/Time   COLORURINE YELLOW 08/31/2016 2108   APPEARANCEUR CLEAR 08/31/2016 2108   LABSPEC 1.025 08/31/2016 2108   PHURINE 7.0 08/31/2016 2108   GLUCOSEU NEGATIVE 08/31/2016 2108   HGBUR SMALL (A) 08/31/2016 2108   BILIRUBINUR NEGATIVE 08/31/2016 2108   KETONESUR NEGATIVE 08/31/2016 2108   PROTEINUR NEGATIVE 08/31/2016 2108   UROBILINOGEN 0.2 10/18/2014 0100   NITRITE NEGATIVE 08/31/2016 2108   LEUKOCYTESUR SMALL (A) 08/31/2016 2108   Sepsis Labs: @LABRCNTIP (procalcitonin:4,lacticidven:4)  ) No results found for this or any previous visit (from the past 240 hour(s)).    Radiology Studies: No results found.   Scheduled Meds:  Continuous Infusions: . lactated ringers 50 mL/hr at 09/07/16 1407  . vancomycin Stopped (09/13/16 0458)     LOS: 13 days   Time Spent in minutes   30 minutes  Dhana Totton D.O. on 09/13/2016 at 2:40 PM  Between 7am to 7pm - Pager - [phone]  After 7pm go to [email or web address] - password [email or web address]  And look for the night coverage person covering for me after hours  [facility] Office  [phone]

## 2016-09-13 NOTE — Progress Notes (Signed)
Spoke with Dr. Marla Roe. Her team will change the wound vac tomorrow. Pt updated as to plan of care.   Fritz Pickerel, RN

## 2016-09-13 NOTE — Consult Note (Addendum)
Consult requested for Vac dressing change.  Pt had ACell and a Vac dressing applied in the OR on 5/29, according to the EMR, and plastics team is following for assessment and plan of care.  Please refer to their team for further questions and orders regarding first post-op dressing change. Please re-consult if further assistance is needed.  Thank-you,  Julien Girt MSN, [place], [place], [place], [place]

## 2016-09-14 LAB — BASIC METABOLIC PANEL
Anion gap: 6 (ref 5–15)
BUN: 22 mg/dL — ABNORMAL HIGH (ref 6–20)
CO2: 24 mmol/L (ref 22–32)
Calcium: 8.7 mg/dL — ABNORMAL LOW (ref 8.9–10.3)
Chloride: 103 mmol/L (ref 101–111)
Creatinine, Ser: 0.82 mg/dL (ref 0.44–1.00)
GFR calc Af Amer: >60 mL/min (ref >60)
GFR calc non Af Amer: >60 mL/min (ref >60)
Glucose, Bld: 104 mg/dL — ABNORMAL HIGH (ref 65–99)
Potassium: 4 mmol/L (ref 3.5–5.1)
Sodium: 133 mmol/L — ABNORMAL LOW (ref 135–145)

## 2016-09-14 LAB — CBC
HCT: 29.2 % — ABNORMAL LOW (ref 36.0–46.0)
Hemoglobin: 9.4 g/dL — ABNORMAL LOW (ref 12.0–15.0)
MCH: 25.3 pg — ABNORMAL LOW (ref 26.0–34.0)
MCHC: 32.2 g/dL (ref 30.0–36.0)
MCV: 78.7 fL (ref 78.0–100.0)
Platelets: 351 10*3/uL (ref 150–400)
RBC: 3.71 MIL/uL — ABNORMAL LOW (ref 3.87–5.11)
RDW: 19.2 % — ABNORMAL HIGH (ref 11.5–15.5)
WBC: 10.6 10*3/uL — ABNORMAL HIGH (ref 4.0–10.5)

## 2016-09-14 LAB — VANCOMYCIN, TROUGH: Vancomycin Tr: 12 ug/mL — ABNORMAL LOW (ref 15–20)

## 2016-09-14 MED ORDER — VANCOMYCIN HCL 10 G IV SOLR
1250.0000 mg | Freq: Three times a day (TID) | INTRAVENOUS | Status: DC
  Administered 2016-09-14 – 2016-09-16 (×6): 1250 mg via INTRAVENOUS
  Filled 2016-09-14 (×7): qty 1250

## 2016-09-14 NOTE — Care Management Note (Signed)
Case Management Note Marvetta Gibbons RN, BSN Unit 2W-Case Manager [phone]  Patient Details  Name: MAHAM QUINTIN MRN: [id number] Date of Birth: [birth year]  Subjective/Objective:  Pt admitted  Septic arthritis, sternoclavicular joint s/p I&D on 5/24- with wound VAC placed-hx of IVDU                Action/Plan: PTA pt lived at home- on last admit was set up with [facility]- pt has also been sent to SNF in past for IV abx needs- If pt is to need long term iv abx again- will need to look at SNF option- with IVDU hx not a candidate for PICC at home- will also need wound VAC for d/c. CM and CSW to follow for d/c needs  Expected Discharge Date:                  Expected Discharge Plan:  Home/Self Care  In-House Referral:  Clinical Social Work  Discharge planning Services  CM Consult  Post Acute Care Choice:    Choice offered to:     DME Arranged:    DME Agency:     HH Arranged:    [place] Agency:     Status of Service:  In process, will continue to follow  If discussed at Long Length of Stay Meetings, dates discussed:  6/5  Discharge Disposition:   Additional Comments:  09/14/16- 0900- Marvetta Gibbons RN,CM- discussed pt this am with DR. Reynaldo Minium in [employer]- per discussion pt is not an LTAC or SNF candidate due to ongoing drug use while here in hospital- per DR. A- pt will need to stay in house to complete IV abx course- unless able to d/c on oral abx. Attending MD aware.   09/13/16- 1500- Uriah Trueba RN, CM- per notes by CM last week- [facility] did not have bed availability and was to review case again today- have spoken with [facility]- who after review of pt's chart today -states that pt is not a candidate for [facility] and they will not be making bed offer (secondary to pt's high risk behavior) - have notified MD- and have placed call to Dr. Reynaldo Minium- medical director- for d/c plan direction-awaiting return call.   Dawayne Patricia, RN 09/14/2016, 4:31 PM

## 2016-09-14 NOTE — Progress Notes (Signed)
PROGRESS NOTE    [name]  GYK:[id number] DOB: Nov 12, 1970 DOA: 08/31/2016 PCP: Patient, [name]   Chief Complaint  Patient presents with  . Abscess    Brief Narrative:  HPI on 08/31/2016 by Dr. Steward Ros [name] is a 46 y.o. female with medical history significant of IVDA, septic joint, septic emboli, endocardititis with recent long hospitalization in dec of 2017 for absess to chest wall with septic joint and infected valve comes in with 3 days of developing mass/absess to the same area to right chest wall with redness that has spread down to right breast with associated pain with breathing and moving right shoulder.  Pt reports when she left hospital she was sober until her mother died in June 02, 2022.  She was on sub axone in [place] but transportation became an issue, then her mom died and she has been using heroin since.  She uses at least every other day, is no longer on subaxone.   She does not use needles to her chest, only to her upper extremities.    Interim history S/p I&D of the right sternoclavicular joint by plastics. ID consulted and recommended 8 weeks of vancomycin.   Assessment & Plan   Right sternoclavicular joint abscess -Blood cultures from 08/31/2016 show no growth to date -Wound culture from 09/02/2016 showed MRSA and staph coag negative -Cardiothoracic and plastic surgery consulted appreciated -Status post excision of the right chest wound 4 x 6 x 3 cm with placement of Acell (2g powder and 7x10cm sheet), placement of VAC on 09/07/2016 by Dr. Marla Roe -Initially placed on vancomycin and cefepime -Of note, patient did have TEE on 09/02/2016 which showed no aortic vegetation and no mitral valve leaflet thickening or calcification, no PFO. EF 55-60% -Discussed antibiotic therapy with infectious disease. Dr. Baxter Flattery recommends 8 weeks of vancomycin. -Ceftriaxone discontinued -patient is not a candidate for PICC line and home discharge given her history of  IVDU -Currently using peripheral IV, removed central line on 6/2 -Discussed with Dr. Marla Roe, patient will need to follow up with her in the office next week on 6/5 or 6/6 -Plan for wound vac change to be changed today -unfortunately, unable to discharge patient to SNF or LTAC given history of leaving AMA. Patient will have to remain hospitalized until completion of antibiotics  -Discussed possible oral antibiotics with Dr. Baxter Flattery. Patient preferably should receive at least another 2 weeks of IV vancomycin, and then possibly transitioned to oral linezolid thereafter. However, patient would need to have CBC every 1-2 weeks to monitor for thrombocytopenia.  Bradycardia -Resolved -Methadone discontinued -Currently asymptomatic  Polysubstance abuse -Discussed cessation -Tox screen positive for cocaine and opiates -per RN, patient had some irrational behavior last night -Repeated urine drug screen on 09/10/2016, +cocaine -Discussed with patient, states she has "gum with cocaine and heroin in it which helps relax her."  Last usage 1-2 days ago (in the hospital) -Discussed the importance of cessation -Patient tells me on 09/12/16, she cleaned out her purse and found needles which she disposed of in the sharps container, and got rid of "stuff" she found. -She wants to get back on suboxone   Hepatitis C infection -Will need outpatient follow-up, possibly treatment in 6 months pending her ability to refrain from drug use. -HIV nonreactive  COPD -Currently stable, no wheezing on exam  Chronic anemia -Baseline hemoglobin between 8 and 9 -hemoglobin 9.4 today -Continue to monitor CBC  Thrombocytosis -Possibly reactive, resolved  Social issues -patient states she  lost her uncle a few days ago to brain cancer   Headache -possibly secondary to stress vs grief over uncle's passing -Resolved  DVT Prophylaxis  SCDs  Code Status: Full  Family Communication: None at bedside  Disposition  Plan: Admitted. Will need to continue admission for IV antibiotics.  Consultants Cardiothoracic surgery Plastic surgery Infectious disease  Procedures  TEE Eexcision of the right chest wound 4 x 6 x 3 cm with placement of Acell (2g powder and 7x10cm sheet), placement of VAC  Antibiotics   Anti-infectives    Start     Dose/Rate Route Frequency Ordered Stop   09/14/16 1000  vancomycin (VANCOCIN) 1,250 mg in sodium chloride 0.9 % 250 mL IVPB     1,250 mg 166.7 mL/hr over 90 Minutes Intravenous Every 8 hours 09/14/16 0400     09/07/16 1454  polymyxin B 500,000 Units, bacitracin 50,000 Units in sodium chloride irrigation 0.9 % 500 mL irrigation  Status:  Discontinued       As needed 09/07/16 1454 09/07/16 1517   09/04/16 1530  vancomycin (VANCOCIN) 1,250 mg in sodium chloride 0.9 % 250 mL IVPB  Status:  Discontinued     1,250 mg 166.7 mL/hr over 90 Minutes Intravenous Every 12 hours 09/04/16 1514 09/14/16 0359   09/01/16 1400  ceFEPIme (MAXIPIME) 2 g in dextrose 5 % 50 mL IVPB  Status:  Discontinued     2 g 100 mL/hr over 30 Minutes Intravenous Every 8 hours 09/01/16 1012 09/08/16 1642   08/31/16 2300  vancomycin (VANCOCIN) IVPB 1000 mg/200 mL premix  Status:  Discontinued     1,000 mg 200 mL/hr over 60 Minutes Intravenous Every 12 hours 08/31/16 2221 09/04/16 1514   08/31/16 2300  ceFEPIme (MAXIPIME) 2 g in dextrose 5 % 50 mL IVPB  Status:  Discontinued     2 g 100 mL/hr over 30 Minutes Intravenous Every 12 hours 08/31/16 2221 09/01/16 1012   08/31/16 1445  vancomycin (VANCOCIN) IVPB 1000 mg/200 mL premix     1,000 mg 200 mL/hr over 60 Minutes Intravenous  Once 08/31/16 1438 08/31/16 1616      Subjective:   [name] seen and examined today.  Patient would like to go to the cafeteria or walk around. Denies chest pain, shortness of breath, abdominal pain, dizziness or headache.   Objective:   Vitals:   09/13/16 1154 09/13/16 1200 09/13/16 1954 09/14/16 0428  BP: 118/68   119/67 120/62  Pulse: 62  71 (!) 59  Resp: 18  17 18   Temp:  98.4 F (36.9 C) 98.2 F (36.8 C) 98.2 F (36.8 C)  TempSrc:  Oral Oral Oral  SpO2: 100%  100% 100%  Weight:      Height:        Intake/Output Summary (Last 24 hours) at 09/14/16 1128 Last data filed at 09/14/16 0300  Gross per 24 hour  Intake              970 ml  Output             3250 ml  Net            -2280 ml   Filed Weights   08/31/16 1213 08/31/16 2222  Weight: 72.6 kg (160 lb) 74.6 kg (164 lb 6.4 oz)   Exam  General: Well developed, well nourished, no distress  HEENT: NCAT,  mucous membranes moist.   Cardiovascular: S1 S2 auscultated, no rubs, murmurs or gallops. Regular rate and rhythm.  Chest  wall: wound vac in place  Respiratory: Clear to auscultation bilaterally with equal chest rise  Abdomen: Soft, nontender, nondistended, + bowel sounds  Extremities: warm dry without cyanosis clubbing or edema  Neuro: AAOx3, nonfocal  Psych: Approriate  Data Reviewed: I have personally reviewed following labs and imaging studies  CBC:  Recent Labs Lab 09/08/16 0827 09/09/16 0435 09/10/16 0404 09/12/16 0156 09/14/16 0220  WBC 8.3 9.8 11.0* 12.0* 10.6*  HGB 9.4* 9.4* 9.5* 9.2* 9.4*  HCT 30.1* 30.2* 30.5* 30.0* 29.2*  MCV 78.4 78.4 78.6 78.3 78.7  PLT 520* 465* 478* 358 562   Basic Metabolic Panel:  Recent Labs Lab 09/08/16 0827 09/09/16 0435 09/10/16 0404 09/13/16 0543 09/14/16 0220  NA 134* 136 135 136 133*  K 4.2 4.3 4.1 4.3 4.0  CL 101 102 101 105 103  CO2 26 27 27 24 24   GLUCOSE 169* 99 107* 101* 104*  BUN 12 10 9 20  22*  CREATININE 0.79 0.71 0.69 0.71 0.82  CALCIUM 8.5* 8.8* 9.1 8.7* 8.7*   GFR: Estimated Creatinine Clearance: 80.9 mL/min (by C-G formula based on SCr of 0.82 mg/dL). Liver Function Tests: No results for input(s): AST, ALT, ALKPHOS, BILITOT, PROT, ALBUMIN in the last 168 hours. No results for input(s): LIPASE, AMYLASE in the last 168 hours. No results for  input(s): AMMONIA in the last 168 hours. Coagulation Profile: No results for input(s): INR, PROTIME in the last 168 hours. Cardiac Enzymes: No results for input(s): CKTOTAL, CKMB, CKMBINDEX, TROPONINI in the last 168 hours. BNP (last 3 results) No results for input(s): PROBNP in the last 8760 hours. HbA1C: No results for input(s): HGBA1C in the last 72 hours. CBG: No results for input(s): GLUCAP in the last 168 hours. Lipid Profile: No results for input(s): CHOL, HDL, LDLCALC, TRIG, CHOLHDL, LDLDIRECT in the last 72 hours. Thyroid Function Tests: No results for input(s): TSH, T4TOTAL, FREET4, T3FREE, THYROIDAB in the last 72 hours. Anemia Panel: No results for input(s): VITAMINB12, FOLATE, FERRITIN, TIBC, IRON, RETICCTPCT in the last 72 hours. Urine analysis:    Component Value Date/Time   COLORURINE YELLOW 08/31/2016 2108   APPEARANCEUR CLEAR 08/31/2016 2108   LABSPEC 1.025 08/31/2016 2108   PHURINE 7.0 08/31/2016 2108   GLUCOSEU NEGATIVE 08/31/2016 2108   HGBUR SMALL (A) 08/31/2016 2108   BILIRUBINUR NEGATIVE 08/31/2016 2108   KETONESUR NEGATIVE 08/31/2016 2108   PROTEINUR NEGATIVE 08/31/2016 2108   UROBILINOGEN 0.2 10/18/2014 0100   NITRITE NEGATIVE 08/31/2016 2108   LEUKOCYTESUR SMALL (A) 08/31/2016 2108   Sepsis Labs: @LABRCNTIP (procalcitonin:4,lacticidven:4)  ) No results found for this or any previous visit (from the past 240 hour(s)).    Radiology Studies: No results found.   Scheduled Meds:  Continuous Infusions: . lactated ringers 50 mL/hr at 09/07/16 1407  . vancomycin 1,250 mg (09/14/16 1051)     LOS: 14 days   Time Spent in minutes   30 minutes  Zoie Sarin D.O. on 09/14/2016 at 11:28 AM  Between 7am to 7pm - Pager - [phone]  After 7pm go to [email or web address] - password [email or web address]  And look for the night coverage person covering for me after hours  [facility] Office  [phone]

## 2016-09-14 NOTE — Progress Notes (Signed)
PHARMACY CONSULT NOTE FOR:  OUTPATIENT  PARENTERAL ANTIBIOTIC THERAPY (OPAT)  Indication: Septic joint  Regimen: Vancomycin 1250mg  IV Q8H End date: 10/26/2016  IV antibiotic discharge orders are pended. To discharging provider:  please sign these orders via discharge navigator,  Select New Orders & click on the button choice - Manage This Unsigned Work.     Thank you for allowing pharmacy to be a part of this patient's care.  Wynona Neat, PharmD, BCPS  09/14/2016, 4:27 AM

## 2016-09-14 NOTE — Progress Notes (Signed)
Pharmacy Antibiotic Note  [name] is a 46 y.o. 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  Pharmacy has been consulted for vancomycin dosing.  Vanc trough below goal, last dose given late so lab was drawn just 10h after dose, true trough closer to 10; last trough was right at low end of goal.  Plan: Change vancomycin to 1250mg  IV every 8 hours for calculated trough ~18.  Goal trough 15-20 mcg/mL.  Height: 5\' 1"  (154.9 cm) Weight: 164 lb 6.4 oz (74.6 kg) IBW/kg (Calculated) : 47.8  Temp (24hrs), Avg:98.3 F (36.8 C), Min:98.2 F (36.8 C), Max:98.4 F (36.9 C)   Recent Labs Lab 09/08/16 0827 09/08/16 1542 09/09/16 0435 09/10/16 0404 09/12/16 0156 09/13/16 0543 09/14/16 0220  WBC 8.3  --  9.8 11.0* 12.0*  --  10.6*  CREATININE 0.79  --  0.71 0.69  --  0.71 0.82  VANCOTROUGH  --  15  --   --   --   --  12*    Estimated Creatinine Clearance: 80.9 mL/min (by C-G formula based on SCr of 0.82 mg/dL).    Allergies  Allergen Reactions  . Mango Flavor Hives and Swelling  . Doxycycline Other (See Comments)    "BURNS" >> ? INCREASED SENSITIVITY TO SUNLIGHT ?  . Pyridium [Phenazopyridine Hcl] Other (See Comments)    Irritation to skin  . Tramadol Nausea And Vomiting    Antimicrobials this admission: vanc 5/22 >> (8wk) cefepime 5/22 >> 5/30  Dose adjustments this admission: 5/26 vanc trough: 12 on 1000mg  IV q12 5/30 vanc trough: 15 on 1250mg  IV q12 6/5 vanc trough: 10 on 1250mg  IV q12  Microbiology results: 5/22 BCx: ngF 5/24 wound abscess: few MRSA, rare CoNS 5/24 fungal cx: ip 5/22 MRSA PCR: neg  Thank you for allowing pharmacy to be a part of this patient's care.  Wynona Neat, PharmD, BCPS  09/14/2016 4:02 AM

## 2016-09-14 NOTE — Clinical Social Work Note (Signed)
CSW acknowledges consult, "Patient will need 8 weeks of IV ABX with history of IV Drug abuse can not be discharged home with PICC." Patient not eligible for SNF placement as she has left SNF AMA in the past. Patient will stay in the hospital to receive her IV abx.  CSW signing off. Consult again if any other social work needs arise.  [name], [place]

## 2016-09-14 NOTE — Progress Notes (Signed)
7 Days Post-Op   Subjective/Chief Complaint: Patient reports she has been doing well following surgery.   VAC dressing removed and Acell sheet visible as expected. VAC dressing reapplied over adaptic and therapy resumed at 125 mm Hg continuous therapy. Peri wound without irritation.    Objective: Vital signs in last 24 hours: Temp:  [97.9 F (36.6 C)-98.2 F (36.8 C)] 97.9 F (36.6 C) (06/05 1250) Pulse Rate:  [59-71] 67 (06/05 1250) Resp:  [17-18] 17 (06/05 1250) BP: (119-159)/(62-80) 159/80 (06/05 1250) SpO2:  [100 %] 100 % (06/05 1250) Last BM Date: 09/14/16  Intake/Output from previous day: 06/04 0701 - 06/05 0700 In: 970 [P.O.:720; IV Piggyback:250] Out: 750 [Urine:750] Intake/Output this shift: Total I/O In: 720 [P.O.:720] Out: 2500 [Urine:2500]    Lab Results:   Recent Labs  09/12/16 0156 09/14/16 0220  WBC 12.0* 10.6*  HGB 9.2* 9.4*  HCT 30.0* 29.2*  PLT 358 351   BMET  Recent Labs  09/13/16 0543 09/14/16 0220  NA 136 133*  K 4.3 4.0  CL 105 103  CO2 24 24  GLUCOSE 101* 104*  BUN 20 22*  CREATININE 0.71 0.82  CALCIUM 8.7* 8.7*   PT/INR No results for input(s): LABPROT, INR in the last 72 hours. ABG No results for input(s): PHART, HCO3 in the last 72 hours.  Invalid input(s): PCO2, PO2  Studies/Results: No results found.  Anti-infectives: Anti-infectives    Start     Dose/Rate Route Frequency Ordered Stop   09/14/16 1000  vancomycin (VANCOCIN) 1,250 mg in sodium chloride 0.9 % 250 mL IVPB     1,250 mg 166.7 mL/hr over 90 Minutes Intravenous Every 8 hours 09/14/16 0400     09/07/16 1454  polymyxin B 500,000 Units, bacitracin 50,000 Units in sodium chloride irrigation 0.9 % 500 mL irrigation  Status:  Discontinued       As needed 09/07/16 1454 09/07/16 1517   09/04/16 1530  vancomycin (VANCOCIN) 1,250 mg in sodium chloride 0.9 % 250 mL IVPB  Status:  Discontinued     1,250 mg 166.7 mL/hr over 90 Minutes Intravenous Every 12 hours  09/04/16 1514 09/14/16 0359   09/01/16 1400  ceFEPIme (MAXIPIME) 2 g in dextrose 5 % 50 mL IVPB  Status:  Discontinued     2 g 100 mL/hr over 30 Minutes Intravenous Every 8 hours 09/01/16 1012 09/08/16 1642   08/31/16 2300  vancomycin (VANCOCIN) IVPB 1000 mg/200 mL premix  Status:  Discontinued     1,000 mg 200 mL/hr over 60 Minutes Intravenous Every 12 hours 08/31/16 2221 09/04/16 1514   08/31/16 2300  ceFEPIme (MAXIPIME) 2 g in dextrose 5 % 50 mL IVPB  Status:  Discontinued     2 g 100 mL/hr over 30 Minutes Intravenous Every 12 hours 08/31/16 2221 09/01/16 1012   08/31/16 1445  vancomycin (VANCOCIN) IVPB 1000 mg/200 mL premix     1,000 mg 200 mL/hr over 60 Minutes Intravenous  Once 08/31/16 1438 08/31/16 1616      Assessment/Plan: s/p Procedure(s): IRRIGATION AND DEBRIDEMENT OF CHEST WOUND, PLACEMENT OF A CELL AND VAC (Right) APPLICATION OF A-CELL OF CHEST/ABDOMEN (Right) Acell has not yet incorporated.  Continue VAC therapy and will need dressing change again in about 1 week.   LOS: 14 days   Suni Jarnagin,PA-C Plastic Surgery [phone]

## 2016-09-14 NOTE — Progress Notes (Signed)
Wound vac and dressing discontinued per PA's request. PA and MD ([facility]) to come see patient. Wound vac supplies at bedside.  Fritz Pickerel, RN

## 2016-09-15 MED ORDER — CLONIDINE HCL 0.1 MG PO TABS
0.1000 mg | ORAL_TABLET | Freq: Every day | ORAL | Status: DC
  Administered 2016-09-16: 0.1 mg via ORAL
  Filled 2016-09-15: qty 1

## 2016-09-15 MED ORDER — CLONIDINE HCL 0.1 MG PO TABS
0.1000 mg | ORAL_TABLET | Freq: Once | ORAL | Status: AC
  Administered 2016-09-15: 0.1 mg via ORAL
  Filled 2016-09-15: qty 1

## 2016-09-15 NOTE — Progress Notes (Signed)
Pt threatening to leave AMA r/t withdrawal symptoms. Dr. Wendee Beavers notified - given one time verbal order for 0.1 mg clonidine. Pt educated as to effect of clonidine on withdrawal effects. Called Dr. Eusebio Friendly office r/t wound vac. Left message with nursing line.   Fritz Pickerel, RN

## 2016-09-15 NOTE — Clinical Social Work Note (Signed)
Clinical Social Work Assessment  Patient Details  Name: [name] MRN: [id number] Date of Birth: 03/23/1971  Date of referral:  09/15/16               Reason for consult:  Substance Use/ETOH Abuse                Permission sought to share information with:    Permission granted to share information::  No  Name::        Agency::     Relationship::     Contact Information:     Housing/Transportation Living arrangements for the past 2 months:  Single Family Home Source of Information:  Patient, Medical Team Patient Interpreter Needed:  None Criminal Activity/Legal Involvement Pertinent to Current Situation/Hospitalization:  No - Comment as needed Significant Relationships:  Parents Lives with:  Self Do you feel safe going back to the place where you live?  Yes Need for family participation in patient care:  Yes (Comment)  Care giving concerns:  Patient in need of substance abuse resources.   Social Worker assessment / plan:  CSW met with patient. No supports at bedside. CSW introduced role and inquired about interest in substance abuse resources. Patient stated that she is already going to a [facility]one clinic in [facility]. She has not been to treatment in 2-3 months but has called them and asked how to get involved in treatment again. They told her to just come in as a walk in. CSW provided a list of substance abuse treatment centers within 25 miles of her home. Patient will look into these options as transportation is an issue for her. Patient reports that she was living with her parents before her mother died a year ago. Her father is now living across the street from her. The patient stated that if she leaves the hospital AMA, she will die. Her withdrawal symptoms are what were making her want to leave earlier this morning. No further concerns. CSW encouraged patient to contact CSW if she needs assistance setting up treatment at a facility  closer to her home.  Employment status:  Disabled (Comment on whether or not currently receiving Disability) Insurance information:  Medicare PT Recommendations:  Not assessed at this time Information / Referral to community resources:  Residential Substance Abuse Treatment Options, Outpatient Substance Abuse Treatment Options  Patient/Family's Response to care:  Patient agreeable to receiving substance abuse resources. Patient's father supportive and involved in patient's care. Patient appreciated social work intervention.  Patient/Family's Understanding of and Emotional Response to Diagnosis, Current Treatment, and Prognosis:  Patient has a good understanding of the reason for admission and what will happen if she leaves AMA. Patient appears pleased with hospital care.  Emotional Assessment Appearance:  Appears stated age Attitude/Demeanor/Rapport:  Other (Pleasant) Affect (typically observed):  Accepting, Appropriate, Calm, Pleasant Orientation:  Oriented to Self, Oriented to Place, Oriented to  Time, Oriented to Situation Alcohol / Substance use:  Illicit Drugs, Tobacco Use Psych involvement (Current and /or in the community):  No (Comment)  Discharge Needs  Concerns to be addressed:  Care Coordination Readmission within the last 30 days:  No Current discharge risk:  Substance Abuse Barriers to Discharge:  Continued Medical Work up, No SNF bed, Active Substance Use   Julie Chroman, LCSW 09/15/2016, 12:26 PM

## 2016-09-15 NOTE — Progress Notes (Signed)
Spoke with Erlinda Hong - plastic surgery PA - regarding pt wanting to leave AMA. Shawn said if pt does leave AMA to try to get home vac, will speak with CM. If pt is to leave without a vac place KY jelly over the site, cover with dry gauze and tape. No saline. Pt is not to shower and must see their team in a week.   Fritz Pickerel, RN

## 2016-09-15 NOTE — Progress Notes (Signed)
PROGRESS NOTE    [name]  CLE:[id number] DOB: 1971/02/17 DOA: 08/31/2016 PCP: Patient, [name]   Chief Complaint  Patient presents with  . Abscess    Brief Narrative:  HPI on 08/31/2016 by Dr. Steward Mueller [name] is a 46 y.o. female with medical history significant of IVDA, septic joint, septic emboli, endocardititis with recent long hospitalization in dec of 2017 for absess to chest wall with septic joint and infected valve comes in with 3 days of developing mass/absess to the same area to right chest wall with redness that has spread down to right breast with associated pain with breathing and moving right shoulder.  Pt reports when she left hospital she was sober until her mother died in 05/23/22.  She was on sub axone in [place] but transportation became an issue, then her mom died and she has been using heroin since.  She uses at least every other day, is no longer on subaxone.   She does not use needles to her chest, only to her upper extremities.    Interim history S/p I&D of the right sternoclavicular joint by plastics. ID consulted and recommended 8 weeks of vancomycin.   Assessment & Plan   Right sternoclavicular joint abscess -Blood cultures from 08/31/2016 show no growth to date -Wound culture from 09/02/2016 showed MRSA and staph coag negative -Cardiothoracic and plastic surgery consulted appreciated -Status post excision of the right chest wound 4 x 6 x 3 cm with placement of Acell (2g powder and 7x10cm sheet), placement of VAC on 09/07/2016 by Dr. Marla Roe -Initially placed on vancomycin and cefepime -Of note, patient did have TEE on 09/02/2016 which showed no aortic vegetation and no mitral valve leaflet thickening or calcification, no PFO. EF 55-60% -Discussed antibiotic therapy with infectious disease. Dr. Baxter Flattery recommends 8 weeks of vancomycin. -Ceftriaxone discontinued -patient is not a candidate for PICC line and home discharge given her history of  IVDU -Currently using peripheral IV, removed central line on 6/2 -Discussed with Dr. Marla Roe, patient will need to follow up with her in the office next week on 6/5 or 6/6 -Plan for wound vac change to be changed today -Unfortunately, unable to discharge patient to SNF or LTAC given history of leaving AMA. Patient will have to remain hospitalized until completion of antibiotics  -Discussed possible oral antibiotics with Dr. Baxter Flattery. Patient preferably should receive at least another 2 weeks of IV vancomycin, and then possibly transitioned to oral linezolid thereafter. However, patient would need to have CBC every 1-2 weeks to monitor for thrombocytopenia.  Bradycardia -Resolved -Methadone discontinued -Currently asymptomatic  Polysubstance abuse -Discussed cessation -Tox screen positive for cocaine and opiates -per RN, patient had some irrational behavior last night -Repeated urine drug screen on 09/10/2016, +cocaine -Discussed with patient, states she has "gum with cocaine and heroin in it which helps relax her."  Last usage 1-2 days ago (in the hospital) -Discussed the importance of cessation -Patient tells me on 09/12/16, she cleaned out her purse and found needles which she disposed of in the sharps container, and got rid of "stuff" she found. -She wants to get back on suboxone  - Consulted psychiatry for withdrawal management  Hepatitis C infection -Will need outpatient follow-up, possibly treatment in 6 months pending her ability to refrain from drug use. -HIV nonreactive  COPD -Currently stable, no wheezing on exam  Chronic anemia -Baseline hemoglobin between 8 and 9 -hemoglobin 9.4 today  Thrombocytosis -Possibly reactive, resolved  Social issues -patient  states she lost her uncle a few days ago to brain cancer   Headache -Resolved  DVT Prophylaxis  SCDs  Code Status: Full  Family Communication: None at bedside  Disposition Plan: Admitted. Will need to continue  admission for IV antibiotics.  Consultants Cardiothoracic surgery Plastic surgery Infectious disease  Procedures  TEE Eexcision of the right chest wound 4 x 6 x 3 cm with placement of Acell (2g powder and 7x10cm sheet), placement of VAC  Antibiotics   Anti-infectives    Start     Dose/Rate Route Frequency Ordered Stop   09/14/16 1000  vancomycin (VANCOCIN) 1,250 mg in sodium chloride 0.9 % 250 mL IVPB     1,250 mg 166.7 mL/hr over 90 Minutes Intravenous Every 8 hours 09/14/16 0400     09/07/16 1454  polymyxin B 500,000 Units, bacitracin 50,000 Units in sodium chloride irrigation 0.9 % 500 mL irrigation  Status:  Discontinued       As needed 09/07/16 1454 09/07/16 1517   09/04/16 1530  vancomycin (VANCOCIN) 1,250 mg in sodium chloride 0.9 % 250 mL IVPB  Status:  Discontinued     1,250 mg 166.7 mL/hr over 90 Minutes Intravenous Every 12 hours 09/04/16 1514 09/14/16 0359   09/01/16 1400  ceFEPIme (MAXIPIME) 2 g in dextrose 5 % 50 mL IVPB  Status:  Discontinued     2 g 100 mL/hr over 30 Minutes Intravenous Every 8 hours 09/01/16 1012 09/08/16 1642   08/31/16 2300  vancomycin (VANCOCIN) IVPB 1000 mg/200 mL premix  Status:  Discontinued     1,000 mg 200 mL/hr over 60 Minutes Intravenous Every 12 hours 08/31/16 2221 09/04/16 1514   08/31/16 2300  ceFEPIme (MAXIPIME) 2 g in dextrose 5 % 50 mL IVPB  Status:  Discontinued     2 g 100 mL/hr over 30 Minutes Intravenous Every 12 hours 08/31/16 2221 09/01/16 1012   08/31/16 1445  vancomycin (VANCOCIN) IVPB 1000 mg/200 mL premix     1,000 mg 200 mL/hr over 60 Minutes Intravenous  Once 08/31/16 1438 08/31/16 1616      Subjective:   [name] seen and examined today.  Patient complains of withdrawal symptoms.  Objective:   Vitals:   09/14/16 0428 09/14/16 1250 09/14/16 2021 09/15/16 0400  BP: 120/62 (!) 159/80 116/67 110/63  Pulse: (!) 59 67 72 65  Resp: 18 17 18 18   Temp: 98.2 F (36.8 C) 97.9 F (36.6 C) 97.7 F (36.5 C) 97.8 F  (36.6 C)  TempSrc: Oral Oral Oral Oral  SpO2: 100% 100% 100% 99%  Weight:      Height:        Intake/Output Summary (Last 24 hours) at 09/15/16 1355 Last data filed at 09/15/16 0500  Gross per 24 hour  Intake              960 ml  Output             2800 ml  Net            -1840 ml   Filed Weights   08/31/16 1213 08/31/16 2222  Weight: 72.6 kg (160 lb) 74.6 kg (164 lb 6.4 oz)   Exam  General: Well developed, well nourished, no distress  HEENT: NCAT,  mucous membranes moist.   Cardiovascular: no cyanosis  Chest wall: wound vac in place  Respiratory: Clear to auscultation bilaterally with equal chest rise  Abdomen: Soft, nontender, nondistended, + bowel sounds  Extremities: warm dry without cyanosis clubbing or  edema  Neuro: AAOx3, nonfocal  Psych: Approriate  Data Reviewed: I have personally reviewed following labs and imaging studies  CBC:  Recent Labs Lab 09/09/16 0435 09/10/16 0404 09/12/16 0156 09/14/16 0220  WBC 9.8 11.0* 12.0* 10.6*  HGB 9.4* 9.5* 9.2* 9.4*  HCT 30.2* 30.5* 30.0* 29.2*  MCV 78.4 78.6 78.3 78.7  PLT 465* 478* 358 488   Basic Metabolic Panel:  Recent Labs Lab 09/09/16 0435 09/10/16 0404 09/13/16 0543 09/14/16 0220  NA 136 135 136 133*  K 4.3 4.1 4.3 4.0  CL 102 101 105 103  CO2 27 27 24 24   GLUCOSE 99 107* 101* 104*  BUN 10 9 20  22*  CREATININE 0.71 0.69 0.71 0.82  CALCIUM 8.8* 9.1 8.7* 8.7*   GFR: Estimated Creatinine Clearance: 80.9 mL/min (by C-G formula based on SCr of 0.82 mg/dL). Liver Function Tests: No results for input(s): AST, ALT, ALKPHOS, BILITOT, PROT, ALBUMIN in the last 168 hours. No results for input(s): LIPASE, AMYLASE in the last 168 hours. No results for input(s): AMMONIA in the last 168 hours. Coagulation Profile: No results for input(s): INR, PROTIME in the last 168 hours. Cardiac Enzymes: No results for input(s): CKTOTAL, CKMB, CKMBINDEX, TROPONINI in the last 168 hours. BNP (last 3 results) No  results for input(s): PROBNP in the last 8760 hours. HbA1C: No results for input(s): HGBA1C in the last 72 hours. CBG: No results for input(s): GLUCAP in the last 168 hours. Lipid Profile: No results for input(s): CHOL, HDL, LDLCALC, TRIG, CHOLHDL, LDLDIRECT in the last 72 hours. Thyroid Function Tests: No results for input(s): TSH, T4TOTAL, FREET4, T3FREE, THYROIDAB in the last 72 hours. Anemia Panel: No results for input(s): VITAMINB12, FOLATE, FERRITIN, TIBC, IRON, RETICCTPCT in the last 72 hours. Urine analysis:    Component Value Date/Time   COLORURINE YELLOW 08/31/2016 2108   APPEARANCEUR CLEAR 08/31/2016 2108   LABSPEC 1.025 08/31/2016 2108   PHURINE 7.0 08/31/2016 2108   GLUCOSEU NEGATIVE 08/31/2016 2108   HGBUR SMALL (A) 08/31/2016 2108   BILIRUBINUR NEGATIVE 08/31/2016 2108   KETONESUR NEGATIVE 08/31/2016 2108   PROTEINUR NEGATIVE 08/31/2016 2108   UROBILINOGEN 0.2 10/18/2014 0100   NITRITE NEGATIVE 08/31/2016 2108   LEUKOCYTESUR SMALL (A) 08/31/2016 2108   Sepsis Labs: @LABRCNTIP (procalcitonin:4,lacticidven:4)  ) No results found for this or any previous visit (from the past 240 hour(s)).    Radiology Studies: No results found.   Scheduled Meds:  Continuous Infusions: . lactated ringers 50 mL/hr at 09/07/16 1407  . vancomycin Stopped (09/15/16 1123)     LOS: 15 days   Time Spent in minutes   30 minutes  Malyah Ohlrich D.O. on 09/15/2016 at 1:55 PM  Between 7am to 7pm - Pager - [phone]  After 7pm go to [email or web address] - password [email or web address]  And look for the night coverage person covering for me after hours  [facility] Office  [phone]

## 2016-09-16 DIAGNOSIS — F1290 Cannabis use, unspecified, uncomplicated: Secondary | ICD-10-CM

## 2016-09-16 DIAGNOSIS — F17210 Nicotine dependence, cigarettes, uncomplicated: Secondary | ICD-10-CM

## 2016-09-16 DIAGNOSIS — F1490 Cocaine use, unspecified, uncomplicated: Secondary | ICD-10-CM

## 2016-09-16 DIAGNOSIS — L0390 Cellulitis, unspecified: Secondary | ICD-10-CM

## 2016-09-16 DIAGNOSIS — F1190 Opioid use, unspecified, uncomplicated: Secondary | ICD-10-CM

## 2016-09-16 LAB — BASIC METABOLIC PANEL
Anion gap: 10 (ref 5–15)
BUN: 16 mg/dL (ref 6–20)
CO2: 22 mmol/L (ref 22–32)
Calcium: 8.6 mg/dL — ABNORMAL LOW (ref 8.9–10.3)
Chloride: 101 mmol/L (ref 101–111)
Creatinine, Ser: 0.81 mg/dL (ref 0.44–1.00)
GFR calc Af Amer: >60 mL/min (ref >60)
GFR calc non Af Amer: >60 mL/min (ref >60)
Glucose, Bld: 118 mg/dL — ABNORMAL HIGH (ref 65–99)
Potassium: 3.8 mmol/L (ref 3.5–5.1)
Sodium: 133 mmol/L — ABNORMAL LOW (ref 135–145)

## 2016-09-16 LAB — VANCOMYCIN, TROUGH: Vancomycin Tr: 22 ug/mL (ref 15–20)

## 2016-09-16 MED ORDER — DICYCLOMINE HCL 20 MG PO TABS
20.0000 mg | ORAL_TABLET | Freq: Four times a day (QID) | ORAL | Status: DC | PRN
  Filled 2016-09-16: qty 1

## 2016-09-16 MED ORDER — CLONIDINE HCL 0.1 MG PO TABS: 0.1000 mg | ORAL_TABLET | Freq: Every day | ORAL | Status: DC

## 2016-09-16 MED ORDER — LOPERAMIDE HCL 2 MG PO CAPS: 2.0000 mg | ORAL_CAPSULE | ORAL | Status: DC | PRN

## 2016-09-16 MED ORDER — VANCOMYCIN HCL IN DEXTROSE 1-5 GM/200ML-% IV SOLN
1000.0000 mg | Freq: Three times a day (TID) | INTRAVENOUS | Status: DC
  Administered 2016-09-16 – 2016-09-17 (×5): 1000 mg via INTRAVENOUS
  Filled 2016-09-16 (×7): qty 200

## 2016-09-16 MED ORDER — CLONIDINE HCL 0.1 MG PO TABS
0.1000 mg | ORAL_TABLET | Freq: Four times a day (QID) | ORAL | Status: AC
  Administered 2016-09-16 – 2016-09-19 (×10): 0.1 mg via ORAL
  Filled 2016-09-16 (×9): qty 1

## 2016-09-16 MED ORDER — METHOCARBAMOL 500 MG PO TABS
500.0000 mg | ORAL_TABLET | Freq: Three times a day (TID) | ORAL | Status: DC | PRN
  Administered 2016-09-18 – 2016-09-19 (×2): 500 mg via ORAL
  Filled 2016-09-16 (×2): qty 1

## 2016-09-16 MED ORDER — VANCOMYCIN HCL 10 G IV SOLR
1250.0000 mg | Freq: Three times a day (TID) | INTRAVENOUS | Status: DC
  Filled 2016-09-16 (×2): qty 1250

## 2016-09-16 MED ORDER — HYDROXYZINE HCL 25 MG PO TABS
25.0000 mg | ORAL_TABLET | Freq: Four times a day (QID) | ORAL | Status: DC | PRN
  Administered 2016-09-19: 25 mg via ORAL
  Filled 2016-09-16: qty 1

## 2016-09-16 MED ORDER — CLONIDINE HCL 0.1 MG PO TABS: 0.1000 mg | ORAL_TABLET | ORAL | Status: DC

## 2016-09-16 MED ORDER — NAPROXEN 250 MG PO TABS
500.0000 mg | ORAL_TABLET | Freq: Two times a day (BID) | ORAL | Status: DC | PRN
  Administered 2016-09-16 – 2016-09-18 (×2): 500 mg via ORAL
  Filled 2016-09-16 (×2): qty 2

## 2016-09-16 MED ORDER — ONDANSETRON 4 MG PO TBDP: 4.0000 mg | ORAL_TABLET | Freq: Four times a day (QID) | ORAL | Status: DC | PRN

## 2016-09-16 NOTE — Progress Notes (Signed)
PROGRESS NOTE    [name]  NFA:[id number] DOB: 01-23-1971 DOA: 08/31/2016 PCP: Patient, [name]   Chief Complaint  Patient presents with  . Abscess    Brief Narrative:  HPI on 08/31/2016 by Dr. Steward Mueller [name] is a 46 y.o. female with medical history significant of IVDA, septic joint, septic emboli, endocardititis with recent long hospitalization in dec of 2017 for absess to chest wall with septic joint and infected valve comes in with 3 days of developing mass/absess to the same area to right chest wall with redness that has spread down to right breast with associated pain with breathing and moving right shoulder.  Pt reports when she left hospital she was sober until her mother died in 06/03/2022.  She was on sub axone in [place] but transportation became an issue, then her mom died and she has been using heroin since.  She uses at least every other day, is no longer on subaxone.   She does not use needles to her chest, only to her upper extremities.    Interim history S/p I&D of the right sternoclavicular joint by plastics. ID consulted and recommended 8 weeks of vancomycin.   Psychiatry consulted for assistance with withdrawal symptoms.  Assessment & Plan   Right sternoclavicular joint abscess -Blood cultures from 08/31/2016 show no growth to date -Wound culture from 09/02/2016 showed MRSA and staph coag negative -Cardiothoracic and plastic surgery consulted appreciated -Status post excision of the right chest wound 4 x 6 x 3 cm with placement of Acell (2g powder and 7x10cm sheet), placement of VAC on 09/07/2016 by Dr. Marla Mueller -Initially placed on vancomycin and cefepime -Of note, patient did have TEE on 09/02/2016 which showed no aortic vegetation and no mitral valve leaflet thickening or calcification, no PFO. EF 55-60% -Discussed antibiotic therapy with infectious disease. Dr. Baxter Mueller recommends 8 weeks of vancomycin. -Ceftriaxone discontinued -patient is not a  candidate for PICC line and home discharge given her history of IVDU -Currently using peripheral IV, removed central line on 6/2 -Discussed with Dr. Marla Mueller, patient will need to follow up with her in the office next week on 6/5 or 6/6 -Plan for wound vac change to be changed today -Unfortunately, unable to discharge patient to SNF or LTAC given history of leaving AMA. Patient will have to remain hospitalized until completion of antibiotics  -Discussed possible oral antibiotics with Dr. Baxter Mueller. Patient preferably should receive at least another 2 weeks of IV vancomycin, and then possibly transitioned to oral linezolid thereafter. However, patient would need to have CBC every 1-2 weeks to monitor for thrombocytopenia.  Bradycardia -Resolved -Methadone discontinued -Currently asymptomatic  Polysubstance abuse -Discussed cessation -Tox screen positive for cocaine and opiates -per RN, patient had some irrational behavior last night -Repeated urine drug screen on 09/10/2016, +cocaine -Discussed with patient, states she has "gum with cocaine and heroin in it which helps relax her."  Last usage 1-2 days ago (in the hospital) -Discussed the importance of cessation -Patient tells me on 09/12/16, she cleaned out her purse and found needles which she disposed of in the sharps container, and got rid of "stuff" she found. -She wants to get back on suboxone  - Consulted psychiatry for withdrawal management, supposedly patient was placing cocaine and heroine in chewing gum which was discontinued approx. 3 days ago after nursing found out.   Hepatitis C infection -Will need outpatient follow-up, possibly treatment in 6 months pending her ability to refrain from drug use. -HIV  nonreactive  COPD -Currently stable, no wheezing on exam  Chronic anemia -Baseline hemoglobin between 8 and 9 -hemoglobin 9.4 today  Thrombocytosis -Possibly reactive, resolved  Social issues -patient states she lost her  uncle a few days ago to brain cancer   Headache -Resolved  DVT Prophylaxis  SCDs  Code Status: Full  Family Communication: None at bedside  Disposition Plan: Admitted. Will need to continue admission for IV antibiotics.  Consultants Cardiothoracic surgery Plastic surgery Infectious disease  Procedures  TEE Eexcision of the right chest wound 4 x 6 x 3 cm with placement of Acell (2g powder and 7x10cm sheet), placement of VAC  Antibiotics   Anti-infectives    Start     Dose/Rate Route Frequency Ordered Stop   09/16/16 1500  vancomycin (VANCOCIN) IVPB 1000 mg/200 mL premix     1,000 mg 200 mL/hr over 60 Minutes Intravenous Every 8 hours 09/16/16 1416     09/16/16 1100  vancomycin (VANCOCIN) 1,250 mg in sodium chloride 0.9 % 250 mL IVPB  Status:  Discontinued     1,250 mg 166.7 mL/hr over 90 Minutes Intravenous Every 8 hours 09/16/16 0745 09/16/16 1416   09/14/16 1000  vancomycin (VANCOCIN) 1,250 mg in sodium chloride 0.9 % 250 mL IVPB  Status:  Discontinued     1,250 mg 166.7 mL/hr over 90 Minutes Intravenous Every 8 hours 09/14/16 0400 09/16/16 0745   09/07/16 1454  polymyxin B 500,000 Units, bacitracin 50,000 Units in sodium chloride irrigation 0.9 % 500 mL irrigation  Status:  Discontinued       As needed 09/07/16 1454 09/07/16 1517   09/04/16 1530  vancomycin (VANCOCIN) 1,250 mg in sodium chloride 0.9 % 250 mL IVPB  Status:  Discontinued     1,250 mg 166.7 mL/hr over 90 Minutes Intravenous Every 12 hours 09/04/16 1514 09/14/16 0359   09/01/16 1400  ceFEPIme (MAXIPIME) 2 g in dextrose 5 % 50 mL IVPB  Status:  Discontinued     2 g 100 mL/hr over 30 Minutes Intravenous Every 8 hours 09/01/16 1012 09/08/16 1642   08/31/16 2300  vancomycin (VANCOCIN) IVPB 1000 mg/200 mL premix  Status:  Discontinued     1,000 mg 200 mL/hr over 60 Minutes Intravenous Every 12 hours 08/31/16 2221 09/04/16 1514   08/31/16 2300  ceFEPIme (MAXIPIME) 2 g in dextrose 5 % 50 mL IVPB  Status:   Discontinued     2 g 100 mL/hr over 30 Minutes Intravenous Every 12 hours 08/31/16 2221 09/01/16 1012   08/31/16 1445  vancomycin (VANCOCIN) IVPB 1000 mg/200 mL premix     1,000 mg 200 mL/hr over 60 Minutes Intravenous  Once 08/31/16 1438 08/31/16 1616      Subjective:   [name] has no new complaints reported today.  Objective:   Vitals:   09/15/16 0400 09/15/16 1300 09/15/16 2039 09/16/16 0456  BP: 110/63 107/80 (!) 105/59 107/66  Pulse: 65 71 67 69  Resp: 18 20 18 18   Temp: 97.8 F (36.6 C)  97.5 F (36.4 C) 97.8 F (36.6 C)  TempSrc: Oral  Oral Oral  SpO2: 99% 100% 100% 100%  Weight:      Height:        Intake/Output Summary (Last 24 hours) at 09/16/16 1441 Last data filed at 09/15/16 1700  Gross per 24 hour  Intake              360 ml  Output  0 ml  Net              360 ml   Filed Weights   08/31/16 1213 08/31/16 2222  Weight: 72.6 kg (160 lb) 74.6 kg (164 lb 6.4 oz)   Exam  General: Well developed, well nourished, no distress  HEENT: NCAT,  mucous membranes moist.   Cardiovascular: no cyanosis  Chest wall: wound vac in place  Respiratory: Clear to auscultation bilaterally with equal chest rise  Abdomen: Soft, nontender, nondistended, + bowel sounds  Extremities: warm dry without cyanosis clubbing or edema  Neuro: AAOx3, nonfocal  Psych: Approriate  Data Reviewed: I have personally reviewed following labs and imaging studies  CBC:  Recent Labs Lab 09/10/16 0404 09/12/16 0156 09/14/16 0220  WBC 11.0* 12.0* 10.6*  HGB 9.5* 9.2* 9.4*  HCT 30.5* 30.0* 29.2*  MCV 78.6 78.3 78.7  PLT 478* 358 654   Basic Metabolic Panel:  Recent Labs Lab 09/10/16 0404 09/13/16 0543 09/14/16 0220 09/16/16 0436  NA 135 136 133* 133*  K 4.1 4.3 4.0 3.8  CL 101 105 103 101  CO2 27 24 24 22   GLUCOSE 107* 101* 104* 118*  BUN 9 20 22* 16  CREATININE 0.69 0.71 0.82 0.81  CALCIUM 9.1 8.7* 8.7* 8.6*   GFR: Estimated Creatinine  Clearance: 81.9 mL/min (by C-G formula based on SCr of 0.81 mg/dL). Liver Function Tests: No results for input(s): AST, ALT, ALKPHOS, BILITOT, PROT, ALBUMIN in the last 168 hours. No results for input(s): LIPASE, AMYLASE in the last 168 hours. No results for input(s): AMMONIA in the last 168 hours. Coagulation Profile: No results for input(s): INR, PROTIME in the last 168 hours. Cardiac Enzymes: No results for input(s): CKTOTAL, CKMB, CKMBINDEX, TROPONINI in the last 168 hours. BNP (last 3 results) No results for input(s): PROBNP in the last 8760 hours. HbA1C: No results for input(s): HGBA1C in the last 72 hours. CBG: No results for input(s): GLUCAP in the last 168 hours. Lipid Profile: No results for input(s): CHOL, HDL, LDLCALC, TRIG, CHOLHDL, LDLDIRECT in the last 72 hours. Thyroid Function Tests: No results for input(s): TSH, T4TOTAL, FREET4, T3FREE, THYROIDAB in the last 72 hours. Anemia Panel: No results for input(s): VITAMINB12, FOLATE, FERRITIN, TIBC, IRON, RETICCTPCT in the last 72 hours. Urine analysis:    Component Value Date/Time   COLORURINE YELLOW 08/31/2016 2108   APPEARANCEUR CLEAR 08/31/2016 2108   LABSPEC 1.025 08/31/2016 2108   PHURINE 7.0 08/31/2016 2108   GLUCOSEU NEGATIVE 08/31/2016 2108   HGBUR SMALL (A) 08/31/2016 2108   BILIRUBINUR NEGATIVE 08/31/2016 2108   KETONESUR NEGATIVE 08/31/2016 2108   PROTEINUR NEGATIVE 08/31/2016 2108   UROBILINOGEN 0.2 10/18/2014 0100   NITRITE NEGATIVE 08/31/2016 2108   LEUKOCYTESUR SMALL (A) 08/31/2016 2108   Sepsis Labs: @LABRCNTIP (procalcitonin:4,lacticidven:4)  ) No results found for this or any previous visit (from the past 240 hour(s)).    Radiology Studies: No results found.   Scheduled Meds: . cloNIDine  0.1 mg Oral Daily   Continuous Infusions: . lactated ringers 50 mL/hr at 09/07/16 1407  . vancomycin       LOS: 16 days   Time Spent in minutes   30 minutes  Jimeka Balan D.O. on 09/16/2016 at  2:41 PM  Between 7am to 7pm - Pager - [phone]  After 7pm go to [email or web address] - password [email or web address]  And look for the night coverage person covering for me after hours  [facility] Office  [phone]

## 2016-09-16 NOTE — Consult Note (Signed)
[facility] Psychiatry Consult   Reason for Consult:  Polysubstance abuse including opioids (heroin), and opioid withdrawals Referring Physician:  Dr. Wendee Beavers Patient Identification: [name] MRN:  [id number] Principal Diagnosis: Septic arthritis of right sternoclavicular joint [facility]) Diagnosis:   Patient Active Problem List   Diagnosis Date Noted  . Joint infection ([place]) [M00.9] 09/02/2016  . Cellulitis [L03.90] 08/31/2016  . Wound infection [T14.8XXA, L08.9] 03/17/2016  . Chest pain at rest [R07.9]   . 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 Anxiety [F41.9] 02/22/2016  . Cigarette smoker [F17.210] 02/22/2016  . Abscess of chest ([place]) [J86.9] 02/21/2016  . Opioid abuse [F11.10] 02/21/2016  . Pulmonary embolism, septic ([place]) [I26.90] 02/21/2016  . Septic arthritis of right sternoclavicular joint ([place]) [M00.9] 02/21/2016  . COPD (chronic obstructive pulmonary disease) ([place]) [J44.9] 10/03/2014  . Hepatitis C infection [B19.20] 07/13/2014  . IV drug abuse [F19.10]   . Depression [F32.9] 06/17/2014  . Polysubstance abuse [F19.10] 06/16/2014    Class: Chronic  . Obesity [E66.9] 06/06/2012    Total Time spent with patient: 1 hour  Subjective:   [name] is a 46 y.o. female patient admitted with relapse on drug of abuse including opioids.  HPI:  [name] a 46 y.o.femalewith medical history significant of IVDA, and polysubstance abuse admitted to [facility] abscess chest wall which required antibiotic treatment. Reportedly patient has been taking chewing gum which consist of cocaine and opiates and chewing and while in the hospital. Patient stated she continued to suffer with his severe symptoms of opioid withdrawal even after being in hospital few weeks. Patient also reported she underwent excessive procedures and antibiotic treatment. Patient reported she does not want to continue using IV drug abuse, heroin or cocaine and requesting clonidine  detox treatment while being in hospital. Patient endorses nausea, vomiting, diarrhea, sweating, generalized body pains. Patient reported she was previously treated with Suboxone but no longer sees taking. Patient reported previously she was at [facility] and also [place]. Patient endorses relapse and drug of abuse especially heroin after her mom died. Patient also reported she has multiple friends who were abusing heroin has died and she does not want to die she wants to be treated. Patient reported she has a children and grandchildren she wants to live for. Patient denies current symptoms of depression, anxiety, hallucinations, delusions and paranoia. Patient denied current suicidal/homicidal ideation, intention or plans.  Past Psychiatric History: [facility] admission 06/16/2014 for detox treatment.  Risk to Self: Is patient at risk for suicide?: No Risk to Others:   Prior Inpatient Therapy:   Prior Outpatient Therapy:    Past Medical History:  Past Medical History:  Diagnosis Date  . Anxiety   . Chronic back pain   . Chronic knee pain   . COPD (chronic obstructive pulmonary disease) ([place])   . DDD (degenerative disc disease)   . Hepatitis C infection 07/13/2014  . IV drug abuse   . Nephrolithiasis   . Panic attacks   . Polysubstance abuse    cocaine, opiates, marijuana, amphetamines, "molly," crystal meth  . Pulmonary embolism [facility])     Past Surgical History:  Procedure Laterality Date  . APPLICATION OF A-CELL OF CHEST/ABDOMEN Right 09/07/2016   Procedure: APPLICATION OF A-CELL OF CHEST/ABDOMEN;  Surgeon: Wallace Going, DO;  Location: [place];  Service: Plastics;  Laterality: Right;  . CESAREAN SECTION    . I&D EXTREMITY Right 09/02/2016  Procedure: IRRIGATION AND DEBRIDEMENT OF RIGHT STERNOCLAVICULAR JOINT, STERNUM AND RIBS;  Surgeon: Gaye Pollack, MD;  Location: [place];  Service: Vascular;  Laterality:  Right;  . INCISION AND DRAINAGE OF WOUND Right 09/07/2016   Procedure: IRRIGATION AND DEBRIDEMENT OF CHEST WOUND, PLACEMENT OF A CELL AND VAC;  Surgeon: Wallace Going, DO;  Location: [place];  Service: Plastics;  Laterality: Right;  . NOSE SURGERY    . STERNAL WOUND DEBRIDEMENT N/A 02/22/2016   Procedure: IRRIGATION AND DEBRIDEMENT MANUBRIUM ABSCESS with wound cultures;  Surgeon: Melrose Nakayama, MD;  Location: [place];  Service: Thoracic;  Laterality: N/A;  . STERNAL WOUND DEBRIDEMENT N/A 02/23/2016   Procedure: DRESSING CHANGE UNDER ANESTHESIA  WITH WOUND VAC PLACEMENT;  Surgeon: Melrose Nakayama, MD;  Location: [place];  Service: Thoracic;  Laterality: N/A;  . TEE WITHOUT CARDIOVERSION N/A 02/23/2016   Procedure: TRANSESOPHAGEAL ECHOCARDIOGRAM (TEE);  Surgeon: Jerline Pain, MD;  Location: [facility] ENDOSCOPY;  Service: Cardiovascular;  Laterality: N/A;  . TUBAL LIGATION     "tied, burned, clipped"   Family History:  Family History  Problem Relation Age of Onset  . COPD Mother   . Hypertension Father   . Heart attack Father   . Diabetes Father   . Arthritis Unknown   . Lung disease Unknown   . Cancer Unknown   . Asthma Unknown    Family Psychiatric  History: unknown Social History:  History  Alcohol Use No     History  Drug Use  . Types: IV, Cocaine, Marijuana, Methamphetamines, Heroin, Other-see comments    Comment: Crystal meth, "molly," narcotics,     Social History   Social History  . Marital status: Divorced    Spouse name: N/A  . Number of children: N/A  . Years of education: 9th grade   Occupational History  .  Unemployed   Social History Main Topics  . Smoking status: Current Every Day Smoker    Packs/day: 1.00    Years: 32.00    Types: Cigarettes  . Smokeless tobacco: Never Used  . Alcohol use No  . Drug use: Yes    Types: IV, Cocaine, Marijuana, Methamphetamines, Heroin, Other-see comments     Comment: Crystal meth, "molly," narcotics,   . Sexual  activity: Yes    Birth control/ protection: Surgical   Other Topics Concern  . None   Social History Narrative  . None   Additional Social History:    Allergies:   Allergies  Allergen Reactions  . Mango Flavor Hives and Swelling  . Doxycycline Other (See Comments)    "BURNS" >> ? INCREASED SENSITIVITY TO SUNLIGHT ?  . Pyridium [Phenazopyridine Hcl] Other (See Comments)    Irritation to skin  . Tramadol Nausea And Vomiting    Labs:  Results for orders placed or performed during the hospital encounter of 08/31/16 (from the past 48 hour(s))  Basic metabolic panel     Status: Abnormal   Collection Time: 09/16/16  4:36 AM  Result Value Ref Range   Sodium 133 (L) 135 - 145 mmol/L   Potassium 3.8 3.5 - 5.1 mmol/L   Chloride 101 101 - 111 mmol/L   CO2 22 22 - 32 mmol/L   Glucose, Bld 118 (H) 65 - 99 mg/dL   BUN 16 6 - 20 mg/dL   Creatinine, Ser 0.81 0.44 - 1.00 mg/dL   Calcium 8.6 (L) 8.9 - 10.3 mg/dL   GFR calc non Af Amer >60 >60 mL/min  GFR calc Af Amer >60 >60 mL/min    Comment: (NOTE) The eGFR has been calculated using the CKD EPI equation. This calculation has not been validated in all clinical situations. eGFR's persistently <60 mL/min signify possible Chronic Kidney Disease.    Anion gap 10 5 - 15  Vancomycin, trough     Status: Abnormal   Collection Time: 09/16/16 10:25 AM  Result Value Ref Range   Vancomycin Tr 22 (HH) 15 - 20 ug/mL    Comment: CRITICAL RESULT CALLED TO, READ BACK BY AND VERIFIED WITH: MIKE WEST,RN AT 1145 09/16/16 BY ZBEECH.     Current Facility-Administered Medications  Medication Dose Route Frequency Provider Last Rate Last Dose  . alum & mag hydroxide-simeth (MAALOX/MYLANTA) 200-200-20 MG/5ML suspension 15 mL  15 mL Oral Q6H PRN Cristal Ford, DO   15 mL at 09/13/16 0350  . cloNIDine (CATAPRES) tablet 0.1 mg  0.1 mg Oral Daily Velvet Bathe, MD   0.1 mg at 09/16/16 1009  . docusate sodium (COLACE) capsule 100 mg  100 mg Oral BID PRN  Cristal Ford, DO      . lactated ringers infusion   Intravenous Continuous Montez Hageman, MD 50 mL/hr at 09/07/16 1407    . ondansetron (ZOFRAN) tablet 4 mg  4 mg Oral Q6H PRN Phillips Grout, MD       Or  . ondansetron (ZOFRAN) injection 4 mg  4 mg Intravenous Q6H PRN Derrill Kay A, MD      . oxyCODONE (Oxy IR/ROXICODONE) immediate release tablet 10 mg  10 mg Oral Q3H PRN Gaye Pollack, MD   10 mg at 09/16/16 1014  . sodium chloride flush (NS) 0.9 % injection 10-40 mL  10-40 mL Intracatheter PRN Velvet Bathe, MD   10 mL at 09/11/16 1226  . vancomycin (VANCOCIN) 1,250 mg in sodium chloride 0.9 % 250 mL IVPB  1,250 mg Intravenous Q8H Velvet Bathe, MD   Stopped at 09/16/16 1100  . zolpidem (AMBIEN) tablet 5 mg  5 mg Oral QHS PRN Velvet Bathe, MD   5 mg at 09/15/16 2316    Musculoskeletal: Strength & Muscle Tone: within normal limits Gait & Station: unable to stand Patient leans: N/A  Psychiatric Specialty Exam: Physical Exam as per history and physical   ROS complaining about nausea, vomiting, diarrhea, generalized body pains, sweating and reportedly bad opioid withdrawal and seeking for withdrawal treatment. Patient denied chest pain and shortness of breath.  No Fever-chills, No Headache, No changes with Vision or hearing, reports vertigo No problems swallowing food or Liquids, No Chest pain, Cough or Shortness of Breath, No Abdominal pain, No Nausea or Vommitting, Bowel movements are regular, No Blood in stool or Urine, No dysuria, No new skin rashes or bruises, No new joints pains-aches,  No new weakness, tingling, numbness in any extremity, No recent weight gain or loss, No polyuria, polydypsia or polyphagia,  A full 10 point Review of Systems was done, except as stated above, all other Review of Systems were negative.  Blood pressure 107/66, pulse 69, temperature 97.8 F (36.6 C), temperature source Oral, resp. rate 18, height 5' 1"  (1.549 m), weight 74.6 kg (164 lb  6.4 oz), last menstrual period 07/01/2016, SpO2 100 %.Body mass index is 31.06 kg/m.  General Appearance: Guarded  Eye Contact:  Good  Speech:  Clear and Coherent  Volume:  Normal  Mood:  Anxious, Depressed and Irritable  Affect:  Inappropriate and Labile  Thought Process:  Coherent and Goal Directed  Orientation:  Full (Time, Place, and Person)  Thought Content:  Rumination  Suicidal Thoughts:  No  Homicidal Thoughts:  No  Memory:  Immediate;   Good Recent;   Fair Remote;   Fair  Judgement:  Impaired  Insight:  Fair  Psychomotor Activity:  Restlessness  Concentration:  Concentration: Good and Attention Span: Fair  Recall:  Good  Fund of Knowledge:  Good  Language:  Good  Akathisia:  Negative  Handed:  Right  AIMS (if indicated):     Assets:  Communication Skills Desire for Improvement Housing Leisure Time Resilience Social Support  ADL's:  Intact  Cognition:  WNL  Sleep:        Treatment Plan Summary: 46 years old female with polysubstance abuse including IVDA seeking for detox treatment for opioid withdrawal symptoms and reportedly chewing gums with cocaine and opiates 3 times daily while in the hospital.  Polysubstance abuse Opioid withdrawal  Recommendation: Patient has no safety concerns We will initiate clonidine detox treatment for the opioid withdrawal has requested by the patient and Dr. Wendee Beavers  Daily contact with patient to assess and evaluate symptoms and progress in treatment and Medication management  Disposition: Patient does not meet criteria for acute psychiatric hospitalization. No evidence of imminent risk to self or others at present.   Supportive therapy provided about ongoing stressors.  Ambrose Finland, MD 09/16/2016 12:59 PM

## 2016-09-16 NOTE — Progress Notes (Signed)
Pharmacy Antibiotic Note  [name] is a 46 y.o. 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  Pharmacy has been consulted for vancomycin dosing.  Vancomycin trough slightly above goal. ID is recommending to treat for 8 weeks of IV vancomycin, using 5/25 as day one.  Plan: Vancomycin 1000mg  IV every 8 hours.  Goal trough 15-20 mcg/mL. Order vancomycin troughs as appropriate  Height: 5\' 1"  (154.9 cm) Weight: 164 lb 6.4 oz (74.6 kg) IBW/kg (Calculated) : 47.8  Temp (24hrs), Avg:97.7 F (36.5 C), Min:97.5 F (36.4 C), Max:97.8 F (36.6 C)   Recent Labs Lab 09/10/16 0404 09/12/16 0156 09/13/16 0543 09/14/16 0220 09/16/16 0436 09/16/16 1025  WBC 11.0* 12.0*  --  10.6*  --   --   CREATININE 0.69  --  0.71 0.82 0.81  --   VANCOTROUGH  --   --   --  12*  --  22*    Estimated Creatinine Clearance: 81.9 mL/min (by C-G formula based on SCr of 0.81 mg/dL).    Allergies  Allergen Reactions  . Mango Flavor Hives and Swelling  . Doxycycline Other (See Comments)    "BURNS" >> ? INCREASED SENSITIVITY TO SUNLIGHT ?  . Pyridium [Phenazopyridine Hcl] Other (See Comments)    Irritation to skin  . Tramadol Nausea And Vomiting    Antimicrobials this admission: vanc 5/22 >> (8wk) cefepime 5/22 >> 5/30  Dose adjustments this admission: 5/26 vanc trough: 12 on 1000mg  IV q12 5/30 vanc trough: 15 on 1250mg  IV q12 6/5 vanc trough: 10 on 1250mg  IV q12 6/7 VT 22 on 1250mg  q8  Microbiology results: 5/22 BCx: ngF 5/24 wound abscess: few MRSA, rare CoNS 5/24 fungal cx: ip 5/22 MRSA PCR: neg  Thank you for allowing pharmacy to be a part of this patient's care.  Georga Bora, PharmD Clinical Pharmacist 09/16/2016 2:21 PM

## 2016-09-17 NOTE — Progress Notes (Signed)
      [address]       [place] [address]             [phone]      10 Days Post-Op Procedure(s) (LRB): IRRIGATION AND DEBRIDEMENT OF CHEST WOUND, PLACEMENT OF A CELL AND VAC (Right) APPLICATION OF A-CELL OF CHEST/ABDOMEN (Right) Subjective: She is frustrated with the addiction management at the hospital. Otherwise enjoying her breakfast this morning without issue.  Objective: Vital signs in last 24 hours: Temp:  [98 F (36.7 C)-98.3 F (36.8 C)] 98 F (36.7 C) (06/08 0514) Pulse Rate:  [69-77] 77 (06/08 0514) Resp:  [18] 18 (06/08 0514) BP: (95-105)/(54-66) 103/65 (06/08 0514) SpO2:  [100 %] 100 % (06/08 0514)     Intake/Output from previous day: 06/07 0701 - 06/08 0700 In: 760 [P.O.:360; IV Piggyback:400] Out: -  Intake/Output this shift: No intake/output data recorded.  General appearance: alert, cooperative and no distress Heart: regular rate and rhythm, S1, S2 normal, no murmur, click, rub or gallop Lungs: clear to auscultation bilaterally Abdomen: soft, non-tender; bowel sounds normal; no masses,  no organomegaly Extremities: extremities normal, atraumatic, no cyanosis or edema Wound: wound vac with good suction  Lab Results: No results for input(s): WBC, HGB, HCT, PLT in the last 72 hours. BMET:  Recent Labs  09/16/16 0436  NA 133*  K 3.8  CL 101  CO2 22  GLUCOSE 118*  BUN 16  CREATININE 0.81  CALCIUM 8.6*    PT/INR: No results for input(s): LABPROT, INR in the last 72 hours. ABG No results found for: PHART, HCO3, TCO2, ACIDBASEDEF, O2SAT CBG (last 3)  No results for input(s): GLUCAP in the last 72 hours.  Assessment/Plan: S/P Procedure(s) (LRB): IRRIGATION AND DEBRIDEMENT OF CHEST WOUND, PLACEMENT OF A CELL AND VAC (Right) APPLICATION OF A-CELL OF CHEST/ABDOMEN (Right)   1. CV- hemodynamically stable, HR NSR in the 70s 2. ID- continue Vancomycin per ID recommendations, will need 8 weeks of therapy total. No recent  fevers. WBC 10.6 today 3. S/P Wound debridement of SCJ with acell placement, wound vac changed Monday 6/4 by Plastics 4. H/O IV Drug abuse- PICC line in place, patient not a candidate for home discharge, will remain inpatient until the conclusion of iv abx 5. Dispo- patient stable for discharge from our standpoint, continue IV ABX, wound vac changes every 5 days per plastics, care per primary. She is looking into outpatient rehab for her drug addiction. She is refusing inpatient drug therapy.    LOS: 17 days    [name] 09/17/2016

## 2016-09-17 NOTE — Progress Notes (Signed)
PROGRESS NOTE    [name]  GHW:[id number] DOB: 06/04/1970 DOA: 08/31/2016 PCP: Patient, [name]   Chief Complaint  Patient presents with  . Abscess    Brief Narrative:  HPI on 08/31/2016 by Dr. Steward Mueller [name] is a 46 y.o. female with medical history significant of IVDA, septic joint, septic emboli, endocardititis with recent long hospitalization in dec of 2017 for absess to chest wall with septic joint and infected valve comes in with 3 days of developing mass/absess to the same area to right chest wall with redness that has spread down to right breast with associated pain with breathing and moving right shoulder.  Pt reports when she left hospital she was sober until her mother died in 05-22-22.  She was on sub axone in [place] but transportation became an issue, then her mom died and she has been using heroin since.  She uses at least every other day, is no longer on subaxone.   She does not use needles to her chest, only to her upper extremities.    Interim history S/p I&D of the right sternoclavicular joint by plastics. ID consulted and recommended 8 weeks of vancomycin.   Psychiatry consulted for assistance with withdrawal symptoms.  Assessment & Plan   Right sternoclavicular joint abscess -Blood cultures from 08/31/2016 show no growth to date -Wound culture from 09/02/2016 showed MRSA and staph coag negative -Cardiothoracic and plastic surgery consulted appreciated -Status post excision of the right chest wound 4 x 6 x 3 cm with placement of Acell (2g powder and 7x10cm sheet), placement of VAC on 09/07/2016 by Dr. Marla Mueller -Initially placed on vancomycin and cefepime -Of note, patient did have TEE on 09/02/2016 which showed no aortic vegetation and no mitral valve leaflet thickening or calcification, no PFO. EF 55-60% -Discussed antibiotic therapy with infectious disease. Dr. Baxter Mueller recommends 8 weeks of vancomycin. -Ceftriaxone discontinued -patient is not a  candidate for PICC line and home discharge given her history of IVDU -Currently using peripheral IV, removed central line on 6/2 -Discussed with Dr. Marla Mueller, patient will need to follow up with her in the office next week on 6/5 or 6/6 -Plan for wound vac change to be changed today -Unfortunately, unable to discharge patient to SNF or LTAC given history of leaving AMA. Patient will have to remain hospitalized until completion of antibiotics  -Discussed possible oral antibiotics with Dr. Baxter Mueller. Patient preferably should receive at least another 2 weeks of IV vancomycin, and then possibly transitioned to oral linezolid thereafter. However, patient would need to have CBC every 1-2 weeks to monitor for thrombocytopenia. - consult pharmacy for accurate dates of IV antibiotic administration.  Bradycardia -Resolved -Methadone discontinued -Currently asymptomatic  Polysubstance abuse -Discussed cessation -Tox screen positive for cocaine and opiates -per RN, patient had some irrational behavior last night -Repeated urine drug screen on 09/10/2016, +cocaine -Discussed with patient, states she has "gum with cocaine and heroin in it which helps relax her."  Last usage 1-2 days ago (in the hospital) -Discussed the importance of cessation -Patient tells me on 09/12/16, she cleaned out her purse and found needles which she disposed of in the sharps container, and got rid of "stuff" she found. -She wants to get back on suboxone  - Consulted psychiatry for withdrawal management, supposedly patient was placing cocaine and heroine in chewing gum which was discontinued. Pt on clonidine currently for opiod withdrawal pathway  Hepatitis C infection -Will need outpatient follow-up, possibly treatment in 6 months  pending her ability to refrain from drug use. -HIV nonreactive  COPD -Currently stable, no wheezing on exam  Chronic anemia -Baseline hemoglobin between 8 and 9 -hemoglobin 9.4  today  Thrombocytosis -Possibly reactive, resolved  Social issues -patient states she lost her uncle a few days ago to brain cancer   Headache -Resolved  DVT Prophylaxis  SCDs  Code Status: Full  Family Communication: None at bedside  Disposition Plan: Admitted. Will need to continue admission for IV antibiotics.  Consultants Cardiothoracic surgery Plastic surgery Infectious disease  Procedures  TEE Eexcision of the right chest wound 4 x 6 x 3 cm with placement of Acell (2g powder and 7x10cm sheet), placement of VAC  Antibiotics   Anti-infectives    Start     Dose/Rate Route Frequency Ordered Stop   09/16/16 1500  vancomycin (VANCOCIN) IVPB 1000 mg/200 mL premix     1,000 mg 200 mL/hr over 60 Minutes Intravenous Every 8 hours 09/16/16 1416     09/16/16 1100  vancomycin (VANCOCIN) 1,250 mg in sodium chloride 0.9 % 250 mL IVPB  Status:  Discontinued     1,250 mg 166.7 mL/hr over 90 Minutes Intravenous Every 8 hours 09/16/16 0745 09/16/16 1416   09/14/16 1000  vancomycin (VANCOCIN) 1,250 mg in sodium chloride 0.9 % 250 mL IVPB  Status:  Discontinued     1,250 mg 166.7 mL/hr over 90 Minutes Intravenous Every 8 hours 09/14/16 0400 09/16/16 0745   09/07/16 1454  polymyxin B 500,000 Units, bacitracin 50,000 Units in sodium chloride irrigation 0.9 % 500 mL irrigation  Status:  Discontinued       As needed 09/07/16 1454 09/07/16 1517   09/04/16 1530  vancomycin (VANCOCIN) 1,250 mg in sodium chloride 0.9 % 250 mL IVPB  Status:  Discontinued     1,250 mg 166.7 mL/hr over 90 Minutes Intravenous Every 12 hours 09/04/16 1514 09/14/16 0359   09/01/16 1400  ceFEPIme (MAXIPIME) 2 g in dextrose 5 % 50 mL IVPB  Status:  Discontinued     2 g 100 mL/hr over 30 Minutes Intravenous Every 8 hours 09/01/16 1012 09/08/16 1642   08/31/16 2300  vancomycin (VANCOCIN) IVPB 1000 mg/200 mL premix  Status:  Discontinued     1,000 mg 200 mL/hr over 60 Minutes Intravenous Every 12 hours 08/31/16 2221  09/04/16 1514   08/31/16 2300  ceFEPIme (MAXIPIME) 2 g in dextrose 5 % 50 mL IVPB  Status:  Discontinued     2 g 100 mL/hr over 30 Minutes Intravenous Every 12 hours 08/31/16 2221 09/01/16 1012   08/31/16 1445  vancomycin (VANCOCIN) IVPB 1000 mg/200 mL premix     1,000 mg 200 mL/hr over 60 Minutes Intravenous  Once 08/31/16 1438 08/31/16 1616      Subjective:   Tymeshia Awan has no new complaints reported today.  Objective:   Vitals:   09/16/16 1443 09/16/16 2126 09/17/16 0514 09/17/16 1206  BP: (!) 95/54 105/66 103/65 (!) 104/56  Pulse: 69 77 77 62  Resp: 18 18 18 17   Temp: 98.1 F (36.7 C) 98.3 F (36.8 C) 98 F (36.7 C) 98.6 F (37 C)  TempSrc: Oral Oral Oral Oral  SpO2: 100% 100% 100% 100%  Weight:      Height:        Intake/Output Summary (Last 24 hours) at 09/17/16 1610 Last data filed at 09/17/16 0830  Gross per 24 hour  Intake             1120 ml  Output                0 ml  Net             1120 ml   Filed Weights   08/31/16 1213 08/31/16 2222  Weight: 72.6 kg (160 lb) 74.6 kg (164 lb 6.4 oz)   Exam  General: Well developed, well nourished, no distress  HEENT: NCAT,  mucous membranes moist.   Cardiovascular: no cyanosis  Chest wall: wound vac in place  Respiratory: Clear to auscultation bilaterally with equal chest rise  Abdomen: Soft, nontender, nondistended, + bowel sounds  Extremities: warm dry without cyanosis clubbing or edema  Neuro: AAOx3, nonfocal  Psych: Approriate  Data Reviewed: I have personally reviewed following labs and imaging studies  CBC:  Recent Labs Lab 09/12/16 0156 09/14/16 0220  WBC 12.0* 10.6*  HGB 9.2* 9.4*  HCT 30.0* 29.2*  MCV 78.3 78.7  PLT 358 662   Basic Metabolic Panel:  Recent Labs Lab 09/13/16 0543 09/14/16 0220 09/16/16 0436  NA 136 133* 133*  K 4.3 4.0 3.8  CL 105 103 101  CO2 24 24 22   GLUCOSE 101* 104* 118*  BUN 20 22* 16  CREATININE 0.71 0.82 0.81  CALCIUM 8.7* 8.7* 8.6*    GFR: Estimated Creatinine Clearance: 81.9 mL/min (by C-G formula based on SCr of 0.81 mg/dL). Liver Function Tests: No results for input(s): AST, ALT, ALKPHOS, BILITOT, PROT, ALBUMIN in the last 168 hours. No results for input(s): LIPASE, AMYLASE in the last 168 hours. No results for input(s): AMMONIA in the last 168 hours. Coagulation Profile: No results for input(s): INR, PROTIME in the last 168 hours. Cardiac Enzymes: No results for input(s): CKTOTAL, CKMB, CKMBINDEX, TROPONINI in the last 168 hours. BNP (last 3 results) No results for input(s): PROBNP in the last 8760 hours. HbA1C: No results for input(s): HGBA1C in the last 72 hours. CBG: No results for input(s): GLUCAP in the last 168 hours. Lipid Profile: No results for input(s): CHOL, HDL, LDLCALC, TRIG, CHOLHDL, LDLDIRECT in the last 72 hours. Thyroid Function Tests: No results for input(s): TSH, T4TOTAL, FREET4, T3FREE, THYROIDAB in the last 72 hours. Anemia Panel: No results for input(s): VITAMINB12, FOLATE, FERRITIN, TIBC, IRON, RETICCTPCT in the last 72 hours. Urine analysis:    Component Value Date/Time   COLORURINE YELLOW 08/31/2016 2108   APPEARANCEUR CLEAR 08/31/2016 2108   LABSPEC 1.025 08/31/2016 2108   PHURINE 7.0 08/31/2016 2108   GLUCOSEU NEGATIVE 08/31/2016 2108   HGBUR SMALL (A) 08/31/2016 2108   BILIRUBINUR NEGATIVE 08/31/2016 2108   KETONESUR NEGATIVE 08/31/2016 2108   PROTEINUR NEGATIVE 08/31/2016 2108   UROBILINOGEN 0.2 10/18/2014 0100   NITRITE NEGATIVE 08/31/2016 2108   LEUKOCYTESUR SMALL (A) 08/31/2016 2108   Sepsis Labs: @LABRCNTIP (procalcitonin:4,lacticidven:4)  ) No results found for this or any previous visit (from the past 240 hour(s)).    Radiology Studies: No results found.   Scheduled Meds: . cloNIDine  0.1 mg Oral QID   Followed by  . [START ON 09/19/2016] cloNIDine  0.1 mg Oral BH-qamhs   Followed by  . [START ON 09/21/2016] cloNIDine  0.1 mg Oral QAC breakfast    Continuous Infusions: . lactated ringers 50 mL/hr at 09/07/16 1407  . vancomycin Stopped (09/17/16 1035)     LOS: 17 days   Time Spent in minutes   30 minutes  Essica Kiker D.O. on 09/17/2016 at 4:10 PM  Between 7am to 7pm - Pager - [phone]  After 7pm go  to [email or web address] - password [email or web address]  And look for the night coverage person covering for me after hours  [facility] Office  [phone]

## 2016-09-18 MED ORDER — OXYCODONE HCL 5 MG PO TABS
5.0000 mg | ORAL_TABLET | Freq: Once | ORAL | Status: AC
  Administered 2016-09-18: 5 mg via ORAL
  Filled 2016-09-18: qty 1

## 2016-09-18 MED ORDER — VANCOMYCIN HCL IN DEXTROSE 1-5 GM/200ML-% IV SOLN
1000.0000 mg | Freq: Three times a day (TID) | INTRAVENOUS | Status: DC
  Administered 2016-09-18 – 2016-09-19 (×3): 1000 mg via INTRAVENOUS
  Filled 2016-09-18 (×4): qty 200

## 2016-09-18 NOTE — Progress Notes (Signed)
PROGRESS NOTE    [name]  CHY:[id number] DOB: 1971/03/31 DOA: 08/31/2016 PCP: Patient, [name]   Chief Complaint  Patient presents with  . Abscess    Brief Narrative:  HPI on 08/31/2016 by Dr. Steward Ros [name] is a 46 y.o. female with medical history significant of IVDA, septic joint, septic emboli, endocardititis with recent long hospitalization in dec of 2017 for absess to chest wall with septic joint and infected valve comes in with 3 days of developing mass/absess to the same area to right chest wall with redness that has spread down to right breast with associated pain with breathing and moving right shoulder.  Pt reports when she left hospital she was sober until her mother died in 06/01/22.  She was on sub axone in [place] but transportation became an issue, then her mom died and she has been using heroin since.  She uses at least every other day, is no longer on subaxone.   She does not use needles to her chest, only to her upper extremities.    Interim history S/p I&D of the right sternoclavicular joint by plastics. ID consulted and recommended 8 weeks of vancomycin.   Psychiatry consulted for assistance with withdrawal symptoms.  Assessment & Plan   Right sternoclavicular joint abscess -Blood cultures from 08/31/2016 show no growth to date -Wound culture from 09/02/2016 showed MRSA and staph coag negative -Cardiothoracic and plastic surgery consulted appreciated -Status post excision of the right chest wound 4 x 6 x 3 cm with placement of Acell (2g powder and 7x10cm sheet), placement of VAC on 09/07/2016 by Dr. Marla Roe -Initially placed on vancomycin and cefepime -Of note, patient did have TEE on 09/02/2016 which showed no aortic vegetation and no mitral valve leaflet thickening or calcification, no PFO. EF 55-60% -Discussed antibiotic therapy with infectious disease. Dr. Baxter Flattery recommends 8 weeks of vancomycin. -Ceftriaxone discontinued -patient is not a  candidate for PICC line and home discharge given her history of IVDU -Currently using peripheral IV, removed central line on 6/2 -Discussed with Dr. Marla Roe, patient will need to follow up with her in the office next week on 6/5 or 6/6 -Plan for wound vac change to be changed today -Unfortunately, unable to discharge patient to SNF or LTAC given history of leaving AMA. Patient will have to remain hospitalized until completion of antibiotics  -Discussed possible oral antibiotics with Dr. Baxter Flattery. However, patient would need to have CBC every 1-2 weeks to monitor for thrombocytopenia. - Pt will need IV Vanc through July (5/22>>> 8wks)  Bradycardia -Resolved -Methadone discontinued -Currently asymptomatic  Polysubstance abuse -Discussed cessation -Tox screen positive for cocaine and opiates -per RN, patient had some irrational behavior last night -Repeated urine drug screen on 09/10/2016, +cocaine -Discussed with patient, states she has "gum with cocaine and heroin in it which helps relax her."  Last usage 1-2 days ago (in the hospital) -Discussed the importance of cessation -Patient tells me on 09/12/16, she cleaned out her purse and found needles which she disposed of in the sharps container, and got rid of "stuff" she found. -She wants to get back on suboxone  - Consulted psychiatry for withdrawal management, supposedly patient was placing cocaine and heroine in chewing gum which was discontinued. Pt on clonidine currently for opiod withdrawal pathway  Hepatitis C infection -Will need outpatient follow-up, possibly treatment in 6 months pending her ability to refrain from drug use. -HIV nonreactive  COPD -Currently stable, no wheezing on exam  Chronic  anemia -Baseline hemoglobin between 8 and 9 -hemoglobin 9.4 today  Thrombocytosis -Possibly reactive, resolved  Social issues -patient states she lost her uncle a few days ago to brain cancer   Headache -Resolved  DVT  Prophylaxis  SCDs  Code Status: Full  Family Communication: None at bedside  Disposition Plan: Admitted. Will need to continue admission for IV antibiotics.  Consultants Cardiothoracic surgery Plastic surgery Infectious disease  Procedures  TEE Eexcision of the right chest wound 4 x 6 x 3 cm with placement of Acell (2g powder and 7x10cm sheet), placement of VAC  Antibiotics   Anti-infectives    Start     Dose/Rate Route Frequency Ordered Stop   09/18/16 1330  vancomycin (VANCOCIN) IVPB 1000 mg/200 mL premix     1,000 mg 200 mL/hr over 60 Minutes Intravenous Every 8 hours 09/18/16 1320     09/16/16 1500  vancomycin (VANCOCIN) IVPB 1000 mg/200 mL premix  Status:  Discontinued     1,000 mg 200 mL/hr over 60 Minutes Intravenous Every 8 hours 09/16/16 1416 09/18/16 1320   09/16/16 1100  vancomycin (VANCOCIN) 1,250 mg in sodium chloride 0.9 % 250 mL IVPB  Status:  Discontinued     1,250 mg 166.7 mL/hr over 90 Minutes Intravenous Every 8 hours 09/16/16 0745 09/16/16 1416   09/14/16 1000  vancomycin (VANCOCIN) 1,250 mg in sodium chloride 0.9 % 250 mL IVPB  Status:  Discontinued     1,250 mg 166.7 mL/hr over 90 Minutes Intravenous Every 8 hours 09/14/16 0400 09/16/16 0745   09/07/16 1454  polymyxin B 500,000 Units, bacitracin 50,000 Units in sodium chloride irrigation 0.9 % 500 mL irrigation  Status:  Discontinued       As needed 09/07/16 1454 09/07/16 1517   09/04/16 1530  vancomycin (VANCOCIN) 1,250 mg in sodium chloride 0.9 % 250 mL IVPB  Status:  Discontinued     1,250 mg 166.7 mL/hr over 90 Minutes Intravenous Every 12 hours 09/04/16 1514 09/14/16 0359   09/01/16 1400  ceFEPIme (MAXIPIME) 2 g in dextrose 5 % 50 mL IVPB  Status:  Discontinued     2 g 100 mL/hr over 30 Minutes Intravenous Every 8 hours 09/01/16 1012 09/08/16 1642   08/31/16 2300  vancomycin (VANCOCIN) IVPB 1000 mg/200 mL premix  Status:  Discontinued     1,000 mg 200 mL/hr over 60 Minutes Intravenous Every 12  hours 08/31/16 2221 09/04/16 1514   08/31/16 2300  ceFEPIme (MAXIPIME) 2 g in dextrose 5 % 50 mL IVPB  Status:  Discontinued     2 g 100 mL/hr over 30 Minutes Intravenous Every 12 hours 08/31/16 2221 09/01/16 1012   08/31/16 1445  vancomycin (VANCOCIN) IVPB 1000 mg/200 mL premix     1,000 mg 200 mL/hr over 60 Minutes Intravenous  Once 08/31/16 1438 08/31/16 1616      Subjective:   [name] Pt was complaining of increased pain and was given one time oxy IR dose which helped with pain control.  Objective:   Vitals:   09/17/16 0514 09/17/16 1206 09/17/16 2033 09/18/16 0530  BP: 103/65 (!) 104/56 (!) 105/49 114/66  Pulse: 77 62 71 (!) 58  Resp: 18 17 17 17   Temp: 98 F (36.7 C) 98.6 F (37 C) 98.4 F (36.9 C) 99.4 F (37.4 C)  TempSrc: Oral Oral Oral Oral  SpO2: 100% 100% 100% 99%  Weight:      Height:        Intake/Output Summary (Last 24 hours) at  09/18/16 1340 Last data filed at 09/18/16 0900  Gross per 24 hour  Intake              720 ml  Output             3400 ml  Net            -2680 ml   Filed Weights   08/31/16 1213 08/31/16 2222  Weight: 72.6 kg (160 lb) 74.6 kg (164 lb 6.4 oz)   Exam  General: Well developed, well nourished, no distress  HEENT: NCAT,  mucous membranes moist.   Cardiovascular: no cyanosis  Chest wall: wound vac in place  Respiratory: Clear to auscultation bilaterally with equal chest rise  Abdomen: Soft, nontender, nondistended, + bowel sounds  Extremities: warm dry without cyanosis clubbing or edema  Neuro: AAOx3, nonfocal  Psych: Approriate  Data Reviewed: I have personally reviewed following labs and imaging studies  CBC:  Recent Labs Lab 09/12/16 0156 09/14/16 0220  WBC 12.0* 10.6*  HGB 9.2* 9.4*  HCT 30.0* 29.2*  MCV 78.3 78.7  PLT 358 109   Basic Metabolic Panel:  Recent Labs Lab 09/13/16 0543 09/14/16 0220 09/16/16 0436  NA 136 133* 133*  K 4.3 4.0 3.8  CL 105 103 101  CO2 24 24 22   GLUCOSE 101*  104* 118*  BUN 20 22* 16  CREATININE 0.71 0.82 0.81  CALCIUM 8.7* 8.7* 8.6*   GFR: Estimated Creatinine Clearance: 81.9 mL/min (by C-G formula based on SCr of 0.81 mg/dL). Liver Function Tests: No results for input(s): AST, ALT, ALKPHOS, BILITOT, PROT, ALBUMIN in the last 168 hours. No results for input(s): LIPASE, AMYLASE in the last 168 hours. No results for input(s): AMMONIA in the last 168 hours. Coagulation Profile: No results for input(s): INR, PROTIME in the last 168 hours. Cardiac Enzymes: No results for input(s): CKTOTAL, CKMB, CKMBINDEX, TROPONINI in the last 168 hours. BNP (last 3 results) No results for input(s): PROBNP in the last 8760 hours. HbA1C: No results for input(s): HGBA1C in the last 72 hours. CBG: No results for input(s): GLUCAP in the last 168 hours. Lipid Profile: No results for input(s): CHOL, HDL, LDLCALC, TRIG, CHOLHDL, LDLDIRECT in the last 72 hours. Thyroid Function Tests: No results for input(s): TSH, T4TOTAL, FREET4, T3FREE, THYROIDAB in the last 72 hours. Anemia Panel: No results for input(s): VITAMINB12, FOLATE, FERRITIN, TIBC, IRON, RETICCTPCT in the last 72 hours. Urine analysis:    Component Value Date/Time   COLORURINE YELLOW 08/31/2016 2108   APPEARANCEUR CLEAR 08/31/2016 2108   LABSPEC 1.025 08/31/2016 2108   PHURINE 7.0 08/31/2016 2108   GLUCOSEU NEGATIVE 08/31/2016 2108   HGBUR SMALL (A) 08/31/2016 2108   BILIRUBINUR NEGATIVE 08/31/2016 2108   KETONESUR NEGATIVE 08/31/2016 2108   PROTEINUR NEGATIVE 08/31/2016 2108   UROBILINOGEN 0.2 10/18/2014 0100   NITRITE NEGATIVE 08/31/2016 2108   LEUKOCYTESUR SMALL (A) 08/31/2016 2108   Sepsis Labs: @LABRCNTIP (procalcitonin:4,lacticidven:4)  ) No results found for this or any previous visit (from the past 240 hour(s)).    Radiology Studies: No results found.   Scheduled Meds: . cloNIDine  0.1 mg Oral QID   Followed by  . [START ON 09/19/2016] cloNIDine  0.1 mg Oral BH-qamhs    Followed by  . [START ON 09/21/2016] cloNIDine  0.1 mg Oral QAC breakfast   Continuous Infusions: . lactated ringers 50 mL/hr at 09/07/16 1407  . vancomycin Stopped (09/18/16 1421)     LOS: 18 days   Time Spent in  minutes   30 minutes  Gayna Braddy D.O. on 09/18/2016 at 1:40 PM  Between 7am to 7pm - Pager - [phone]  After 7pm go to [email or web address] - password [email or web address]  And look for the night coverage person covering for me after hours  [facility] Office  [phone]

## 2016-09-19 LAB — BASIC METABOLIC PANEL
Anion gap: 9 (ref 5–15)
BUN: 14 mg/dL (ref 6–20)
CO2: 22 mmol/L (ref 22–32)
Calcium: 8.7 mg/dL — ABNORMAL LOW (ref 8.9–10.3)
Chloride: 105 mmol/L (ref 101–111)
Creatinine, Ser: 0.69 mg/dL (ref 0.44–1.00)
GFR calc Af Amer: >60 mL/min (ref >60)
GFR calc non Af Amer: >60 mL/min (ref >60)
Glucose, Bld: 121 mg/dL — ABNORMAL HIGH (ref 65–99)
Potassium: 4 mmol/L (ref 3.5–5.1)
Sodium: 136 mmol/L (ref 135–145)

## 2016-09-19 NOTE — Progress Notes (Signed)
Pt. Was discharged against medical advice doctor Wendee Beavers is aware and spoke to the pt. Before she left

## 2016-09-19 NOTE — Progress Notes (Signed)
PROGRESS NOTE    [name]  NFA:[id number] DOB: 1970/07/31 DOA: 08/31/2016 PCP: Patient, [name]   Chief Complaint  Patient presents with  . Abscess    Brief Narrative:  HPI on 08/31/2016 by Dr. Steward Ros [name] is a 46 y.o. female with medical history significant of IVDA, septic joint, septic emboli, endocardititis with recent long hospitalization in dec of 2017 for absess to chest wall with septic joint and infected valve comes in with 3 days of developing mass/absess to the same area to right chest wall with redness that has spread down to right breast with associated pain with breathing and moving right shoulder.  Pt reports when she left hospital she was sober until her mother died in 05-27-2022.  She was on sub axone in [place] but transportation became an issue, then her mom died and she has been using heroin since.  She uses at least every other day, is no longer on subaxone.   She does not use needles to her chest, only to her upper extremities.    Interim history S/p I&D of the right sternoclavicular joint by plastics. ID consulted and recommended 8 weeks of vancomycin.   Psychiatry consulted for assistance with withdrawal symptoms.  Assessment & Plan   Right sternoclavicular joint abscess -Blood cultures from 08/31/2016 show no growth to date -Wound culture from 09/02/2016 showed MRSA and staph coag negative -Cardiothoracic and plastic surgery consulted appreciated -Status post excision of the right chest wound 4 x 6 x 3 cm with placement of Acell (2g powder and 7x10cm sheet), placement of VAC on 09/07/2016 by Dr. Marla Roe -Initially placed on vancomycin and cefepime -Of note, patient did have TEE on 09/02/2016 which showed no aortic vegetation and no mitral valve leaflet thickening or calcification, no PFO. EF 55-60% -Discussed antibiotic therapy with infectious disease. Dr. Baxter Flattery recommends 8 weeks of vancomycin. -Ceftriaxone discontinued -patient is not a  candidate for PICC line and home discharge given her history of IVDU -Currently using peripheral IV, removed central line on 6/2 -Discussed with Dr. Marla Roe, patient will need to follow up with her in the office next week on 6/5 or 6/6 -Plan for wound vac change to be changed today -Unfortunately, unable to discharge patient to SNF or LTAC given history of leaving [facility]. Patient will have to remain hospitalized until completion of antibiotics  -Discussed possible oral antibiotics with Dr. Baxter Flattery. However, patient would need to have CBC every 1-2 weeks to monitor for thrombocytopenia. - Pt will need IV Vanc through July (5/22>>> 8wks)  Bradycardia -Resolved -Methadone discontinued -Currently asymptomatic  Polysubstance abuse -Discussed cessation -Tox screen positive for cocaine and opiates -per RN, patient had some irrational behavior last night -Repeated urine drug screen on 09/10/2016, +cocaine -Discussed with patient, states she has "gum with cocaine and heroin in it which helps relax her."  Last usage 1-2 days ago (in the hospital) -Discussed the importance of cessation -Patient tells me on 09/12/16, she cleaned out her purse and found needles which she disposed of in the sharps container, and got rid of "stuff" she found. -She wants to get back on suboxone  - Consulted psychiatry for withdrawal management, Pt on clonidine currently for opiod withdrawal pathway  Hepatitis C infection -Will need outpatient follow-up, possibly treatment in 6 months pending her ability to refrain from drug use. -HIV nonreactive  COPD -Currently stable, no wheezing on exam  Chronic anemia -Baseline hemoglobin between 8 and 9 -hemoglobin 9.4 today  Thrombocytosis -Possibly  reactive, resolved  Social issues -patient states she lost her uncle a few days ago to brain cancer   Headache -Resolved  DVT Prophylaxis  SCDs  Code Status: Full  Family Communication: None at bedside  Disposition  Plan: Admitted. Will need to continue admission for IV antibiotics.  Consultants Cardiothoracic surgery Plastic surgery Infectious disease  Procedures  TEE Eexcision of the right chest wound 4 x 6 x 3 cm with placement of Acell (2g powder and 7x10cm sheet), placement of VAC  Antibiotics   Anti-infectives    Start     Dose/Rate Route Frequency Ordered Stop   09/18/16 1330  vancomycin (VANCOCIN) IVPB 1000 mg/200 mL premix     1,000 mg 200 mL/hr over 60 Minutes Intravenous Every 8 hours 09/18/16 1320     09/16/16 1500  vancomycin (VANCOCIN) IVPB 1000 mg/200 mL premix  Status:  Discontinued     1,000 mg 200 mL/hr over 60 Minutes Intravenous Every 8 hours 09/16/16 1416 09/18/16 1320   09/16/16 1100  vancomycin (VANCOCIN) 1,250 mg in sodium chloride 0.9 % 250 mL IVPB  Status:  Discontinued     1,250 mg 166.7 mL/hr over 90 Minutes Intravenous Every 8 hours 09/16/16 0745 09/16/16 1416   09/14/16 1000  vancomycin (VANCOCIN) 1,250 mg in sodium chloride 0.9 % 250 mL IVPB  Status:  Discontinued     1,250 mg 166.7 mL/hr over 90 Minutes Intravenous Every 8 hours 09/14/16 0400 09/16/16 0745   09/07/16 1454  polymyxin B 500,000 Units, bacitracin 50,000 Units in sodium chloride irrigation 0.9 % 500 mL irrigation  Status:  Discontinued       As needed 09/07/16 1454 09/07/16 1517   09/04/16 1530  vancomycin (VANCOCIN) 1,250 mg in sodium chloride 0.9 % 250 mL IVPB  Status:  Discontinued     1,250 mg 166.7 mL/hr over 90 Minutes Intravenous Every 12 hours 09/04/16 1514 09/14/16 0359   09/01/16 1400  ceFEPIme (MAXIPIME) 2 g in dextrose 5 % 50 mL IVPB  Status:  Discontinued     2 g 100 mL/hr over 30 Minutes Intravenous Every 8 hours 09/01/16 1012 09/08/16 1642   08/31/16 2300  vancomycin (VANCOCIN) IVPB 1000 mg/200 mL premix  Status:  Discontinued     1,000 mg 200 mL/hr over 60 Minutes Intravenous Every 12 hours 08/31/16 2221 09/04/16 1514   08/31/16 2300  ceFEPIme (MAXIPIME) 2 g in dextrose 5 % 50 mL  IVPB  Status:  Discontinued     2 g 100 mL/hr over 30 Minutes Intravenous Every 12 hours 08/31/16 2221 09/01/16 1012   08/31/16 1445  vancomycin (VANCOCIN) IVPB 1000 mg/200 mL premix     1,000 mg 200 mL/hr over 60 Minutes Intravenous  Once 08/31/16 1438 08/31/16 1616      Subjective:   Otila Kluver Eager Pt would like to be transitioned to oral antibiotics and discharge. I have indicated that this is not what the specialist recommended and that was not a safe discharge plan. She is considering leaving ama.  Objective:   Vitals:   09/18/16 1500 09/18/16 1731 09/18/16 1951 09/19/16 0403  BP: 121/71 (!) 100/58 (!) 99/57 (!) 90/51  Pulse: (!) 58 61 (!) 57 (!) 59  Resp: 18 18 18 18   Temp: 97.9 F (36.6 C) 98.3 F (36.8 C) 98 F (36.7 C) 97.8 F (36.6 C)  TempSrc: Oral Oral Oral Oral  SpO2: 100% 99% 100% 99%  Weight:      Height:  Intake/Output Summary (Last 24 hours) at 09/19/16 1105 Last data filed at 09/19/16 0900  Gross per 24 hour  Intake             1080 ml  Output              800 ml  Net              280 ml   Filed Weights   08/31/16 1213 08/31/16 2222  Weight: 72.6 kg (160 lb) 74.6 kg (164 lb 6.4 oz)   Exam  General: Well developed, well nourished, no distress  HEENT: NCAT,  mucous membranes moist.   Cardiovascular: no cyanosis  Chest wall: wound vac in place  Respiratory: Clear to auscultation bilaterally with equal chest rise  Abdomen: Soft, nontender, nondistended, + bowel sounds  Extremities: warm dry without cyanosis clubbing or edema  Neuro: AAOx3, nonfocal  Psych: Approriate  Data Reviewed: I have personally reviewed following labs and imaging studies  CBC:  Recent Labs Lab 09/14/16 0220  WBC 10.6*  HGB 9.4*  HCT 29.2*  MCV 78.7  PLT 416   Basic Metabolic Panel:  Recent Labs Lab 09/13/16 0543 09/14/16 0220 09/16/16 0436 09/19/16 0456  NA 136 133* 133* 136  K 4.3 4.0 3.8 4.0  CL 105 103 101 105  CO2 24 24 22 22   GLUCOSE  101* 104* 118* 121*  BUN 20 22* 16 14  CREATININE 0.71 0.82 0.81 0.69  CALCIUM 8.7* 8.7* 8.6* 8.7*   GFR: Estimated Creatinine Clearance: 82.9 mL/min (by C-G formula based on SCr of 0.69 mg/dL). Liver Function Tests: No results for input(s): AST, ALT, ALKPHOS, BILITOT, PROT, ALBUMIN in the last 168 hours. No results for input(s): LIPASE, AMYLASE in the last 168 hours. No results for input(s): AMMONIA in the last 168 hours. Coagulation Profile: No results for input(s): INR, PROTIME in the last 168 hours. Cardiac Enzymes: No results for input(s): CKTOTAL, CKMB, CKMBINDEX, TROPONINI in the last 168 hours. BNP (last 3 results) No results for input(s): PROBNP in the last 8760 hours. HbA1C: No results for input(s): HGBA1C in the last 72 hours. CBG: No results for input(s): GLUCAP in the last 168 hours. Lipid Profile: No results for input(s): CHOL, HDL, LDLCALC, TRIG, CHOLHDL, LDLDIRECT in the last 72 hours. Thyroid Function Tests: No results for input(s): TSH, T4TOTAL, FREET4, T3FREE, THYROIDAB in the last 72 hours. Anemia Panel: No results for input(s): VITAMINB12, FOLATE, FERRITIN, TIBC, IRON, RETICCTPCT in the last 72 hours. Urine analysis:    Component Value Date/Time   COLORURINE YELLOW 08/31/2016 2108   APPEARANCEUR CLEAR 08/31/2016 2108   LABSPEC 1.025 08/31/2016 2108   PHURINE 7.0 08/31/2016 2108   GLUCOSEU NEGATIVE 08/31/2016 2108   HGBUR SMALL (A) 08/31/2016 2108   BILIRUBINUR NEGATIVE 08/31/2016 2108   KETONESUR NEGATIVE 08/31/2016 2108   PROTEINUR NEGATIVE 08/31/2016 2108   UROBILINOGEN 0.2 10/18/2014 0100   NITRITE NEGATIVE 08/31/2016 2108   LEUKOCYTESUR SMALL (A) 08/31/2016 2108   Sepsis Labs: @LABRCNTIP (procalcitonin:4,lacticidven:4)  ) No results found for this or any previous visit (from the past 240 hour(s)).    Radiology Studies: No results found.   Scheduled Meds: . cloNIDine  0.1 mg Oral BH-qamhs   Followed by  . [START ON 09/21/2016] cloNIDine   0.1 mg Oral QAC breakfast   Continuous Infusions: . lactated ringers 50 mL/hr at 09/07/16 1407  . vancomycin Stopped (09/19/16 0656)     LOS: 19 days   Time Spent in minutes   30  minutes  Mehlani Blankenburg D.O. on 09/19/2016 at 11:05 AM  Between 7am to 7pm - Pager - [phone]  After 7pm go to [email or web address] - password [email or web address]  And look for the night coverage person covering for me after hours  [facility] Office  [phone]

## 2016-09-24 ENCOUNTER — Inpatient Hospital Stay (HOSPITAL_COMMUNITY)
Admission: EM | Admit: 2016-09-24 | Discharge: 2016-10-18 | DRG: 549 | Disposition: A | Discharge status: Home / Self Care | Payer: Medicare Other | Attending: Family Medicine | Admitting: Family Medicine

## 2016-09-24 ENCOUNTER — Encounter (HOSPITAL_COMMUNITY): Payer: Self-pay | Admitting: *Deleted

## 2016-09-24 DIAGNOSIS — I2690 Septic pulmonary embolism without acute cor pulmonale: Secondary | ICD-10-CM | POA: Diagnosis present

## 2016-09-24 DIAGNOSIS — R509 Fever, unspecified: Secondary | ICD-10-CM

## 2016-09-24 DIAGNOSIS — M549 Dorsalgia, unspecified: Secondary | ICD-10-CM | POA: Diagnosis present

## 2016-09-24 DIAGNOSIS — L02213 Cutaneous abscess of chest wall: Secondary | ICD-10-CM | POA: Diagnosis present

## 2016-09-24 DIAGNOSIS — I368 Other nonrheumatic tricuspid valve disorders: Secondary | ICD-10-CM

## 2016-09-24 DIAGNOSIS — Z9889 Other specified postprocedural states: Secondary | ICD-10-CM

## 2016-09-24 DIAGNOSIS — M25511 Pain in right shoulder: Secondary | ICD-10-CM

## 2016-09-24 DIAGNOSIS — F1120 Opioid dependence, uncomplicated: Secondary | ICD-10-CM | POA: Diagnosis present

## 2016-09-24 DIAGNOSIS — Z9851 Tubal ligation status: Secondary | ICD-10-CM

## 2016-09-24 DIAGNOSIS — I079 Rheumatic tricuspid valve disease, unspecified: Secondary | ICD-10-CM | POA: Diagnosis present

## 2016-09-24 DIAGNOSIS — Z825 Family history of asthma and other chronic lower respiratory diseases: Secondary | ICD-10-CM

## 2016-09-24 DIAGNOSIS — K5900 Constipation, unspecified: Secondary | ICD-10-CM | POA: Diagnosis present

## 2016-09-24 DIAGNOSIS — J449 Chronic obstructive pulmonary disease, unspecified: Secondary | ICD-10-CM | POA: Diagnosis present

## 2016-09-24 DIAGNOSIS — M009 Pyogenic arthritis, unspecified: Secondary | ICD-10-CM | POA: Diagnosis not present

## 2016-09-24 DIAGNOSIS — T148XXA Other injury of unspecified body region, initial encounter: Secondary | ICD-10-CM

## 2016-09-24 DIAGNOSIS — Z87442 Personal history of urinary calculi: Secondary | ICD-10-CM

## 2016-09-24 DIAGNOSIS — J869 Pyothorax without fistula: Secondary | ICD-10-CM | POA: Diagnosis present

## 2016-09-24 DIAGNOSIS — Z9119 Patient's noncompliance with other medical treatment and regimen: Secondary | ICD-10-CM

## 2016-09-24 DIAGNOSIS — F17210 Nicotine dependence, cigarettes, uncomplicated: Secondary | ICD-10-CM | POA: Diagnosis present

## 2016-09-24 DIAGNOSIS — G8929 Other chronic pain: Secondary | ICD-10-CM | POA: Diagnosis present

## 2016-09-24 DIAGNOSIS — L089 Local infection of the skin and subcutaneous tissue, unspecified: Secondary | ICD-10-CM | POA: Diagnosis present

## 2016-09-24 DIAGNOSIS — F1910 Other psychoactive substance abuse, uncomplicated: Secondary | ICD-10-CM | POA: Diagnosis present

## 2016-09-24 DIAGNOSIS — G4700 Insomnia, unspecified: Secondary | ICD-10-CM | POA: Diagnosis present

## 2016-09-24 DIAGNOSIS — F1490 Cocaine use, unspecified, uncomplicated: Secondary | ICD-10-CM | POA: Diagnosis present

## 2016-09-24 DIAGNOSIS — R918 Other nonspecific abnormal finding of lung field: Secondary | ICD-10-CM | POA: Diagnosis not present

## 2016-09-24 DIAGNOSIS — M25569 Pain in unspecified knee: Secondary | ICD-10-CM | POA: Diagnosis present

## 2016-09-24 DIAGNOSIS — Z8249 Family history of ischemic heart disease and other diseases of the circulatory system: Secondary | ICD-10-CM

## 2016-09-24 DIAGNOSIS — Z86711 Personal history of pulmonary embolism: Secondary | ICD-10-CM

## 2016-09-24 DIAGNOSIS — Z833 Family history of diabetes mellitus: Secondary | ICD-10-CM

## 2016-09-24 DIAGNOSIS — O99320 Drug use complicating pregnancy, unspecified trimester: Secondary | ICD-10-CM | POA: Diagnosis present

## 2016-09-24 DIAGNOSIS — K649 Unspecified hemorrhoids: Secondary | ICD-10-CM | POA: Diagnosis present

## 2016-09-24 DIAGNOSIS — B181 Chronic viral hepatitis B without delta-agent: Secondary | ICD-10-CM | POA: Diagnosis present

## 2016-09-24 DIAGNOSIS — M791 Myalgia: Secondary | ICD-10-CM | POA: Diagnosis present

## 2016-09-24 DIAGNOSIS — B182 Chronic viral hepatitis C: Secondary | ICD-10-CM | POA: Diagnosis present

## 2016-09-24 NOTE — ED Triage Notes (Signed)
Pt c/o fever with aches and pain x 1 day

## 2016-09-25 ENCOUNTER — Emergency Department (HOSPITAL_COMMUNITY): Payer: Medicare Other

## 2016-09-25 ENCOUNTER — Encounter (HOSPITAL_COMMUNITY): Payer: Self-pay | Admitting: Emergency Medicine

## 2016-09-25 DIAGNOSIS — Z87442 Personal history of urinary calculi: Secondary | ICD-10-CM | POA: Diagnosis not present

## 2016-09-25 DIAGNOSIS — M009 Pyogenic arthritis, unspecified: Secondary | ICD-10-CM | POA: Diagnosis not present

## 2016-09-25 DIAGNOSIS — Z9119 Patient's noncompliance with other medical treatment and regimen: Secondary | ICD-10-CM | POA: Diagnosis not present

## 2016-09-25 DIAGNOSIS — L089 Local infection of the skin and subcutaneous tissue, unspecified: Secondary | ICD-10-CM

## 2016-09-25 DIAGNOSIS — I079 Rheumatic tricuspid valve disease, unspecified: Secondary | ICD-10-CM | POA: Diagnosis present

## 2016-09-25 DIAGNOSIS — B181 Chronic viral hepatitis B without delta-agent: Secondary | ICD-10-CM | POA: Diagnosis present

## 2016-09-25 DIAGNOSIS — F1910 Other psychoactive substance abuse, uncomplicated: Secondary | ICD-10-CM | POA: Diagnosis not present

## 2016-09-25 DIAGNOSIS — K649 Unspecified hemorrhoids: Secondary | ICD-10-CM | POA: Diagnosis present

## 2016-09-25 DIAGNOSIS — B182 Chronic viral hepatitis C: Secondary | ICD-10-CM | POA: Diagnosis present

## 2016-09-25 DIAGNOSIS — G8929 Other chronic pain: Secondary | ICD-10-CM | POA: Diagnosis present

## 2016-09-25 DIAGNOSIS — T148XXA Other injury of unspecified body region, initial encounter: Secondary | ICD-10-CM

## 2016-09-25 DIAGNOSIS — Z833 Family history of diabetes mellitus: Secondary | ICD-10-CM | POA: Diagnosis not present

## 2016-09-25 DIAGNOSIS — F17210 Nicotine dependence, cigarettes, uncomplicated: Secondary | ICD-10-CM | POA: Diagnosis present

## 2016-09-25 DIAGNOSIS — L02213 Cutaneous abscess of chest wall: Secondary | ICD-10-CM | POA: Diagnosis present

## 2016-09-25 DIAGNOSIS — F1190 Opioid use, unspecified, uncomplicated: Secondary | ICD-10-CM | POA: Diagnosis not present

## 2016-09-25 DIAGNOSIS — J869 Pyothorax without fistula: Secondary | ICD-10-CM | POA: Diagnosis not present

## 2016-09-25 DIAGNOSIS — I368 Other nonrheumatic tricuspid valve disorders: Secondary | ICD-10-CM | POA: Diagnosis not present

## 2016-09-25 DIAGNOSIS — M25511 Pain in right shoulder: Secondary | ICD-10-CM | POA: Diagnosis present

## 2016-09-25 DIAGNOSIS — R509 Fever, unspecified: Secondary | ICD-10-CM | POA: Diagnosis not present

## 2016-09-25 DIAGNOSIS — F1120 Opioid dependence, uncomplicated: Secondary | ICD-10-CM | POA: Diagnosis present

## 2016-09-25 DIAGNOSIS — I2690 Septic pulmonary embolism without acute cor pulmonale: Secondary | ICD-10-CM | POA: Diagnosis not present

## 2016-09-25 DIAGNOSIS — Z9851 Tubal ligation status: Secondary | ICD-10-CM | POA: Diagnosis not present

## 2016-09-25 DIAGNOSIS — R918 Other nonspecific abnormal finding of lung field: Secondary | ICD-10-CM | POA: Diagnosis not present

## 2016-09-25 DIAGNOSIS — K5900 Constipation, unspecified: Secondary | ICD-10-CM | POA: Diagnosis present

## 2016-09-25 DIAGNOSIS — M791 Myalgia: Secondary | ICD-10-CM | POA: Diagnosis present

## 2016-09-25 DIAGNOSIS — Z8249 Family history of ischemic heart disease and other diseases of the circulatory system: Secondary | ICD-10-CM | POA: Diagnosis not present

## 2016-09-25 DIAGNOSIS — Z825 Family history of asthma and other chronic lower respiratory diseases: Secondary | ICD-10-CM | POA: Diagnosis not present

## 2016-09-25 DIAGNOSIS — G4700 Insomnia, unspecified: Secondary | ICD-10-CM | POA: Diagnosis present

## 2016-09-25 DIAGNOSIS — M25569 Pain in unspecified knee: Secondary | ICD-10-CM | POA: Diagnosis present

## 2016-09-25 DIAGNOSIS — Z86711 Personal history of pulmonary embolism: Secondary | ICD-10-CM | POA: Diagnosis not present

## 2016-09-25 DIAGNOSIS — F1490 Cocaine use, unspecified, uncomplicated: Secondary | ICD-10-CM | POA: Diagnosis present

## 2016-09-25 DIAGNOSIS — M549 Dorsalgia, unspecified: Secondary | ICD-10-CM | POA: Diagnosis present

## 2016-09-25 DIAGNOSIS — Z9889 Other specified postprocedural states: Secondary | ICD-10-CM | POA: Diagnosis not present

## 2016-09-25 DIAGNOSIS — J449 Chronic obstructive pulmonary disease, unspecified: Secondary | ICD-10-CM | POA: Diagnosis present

## 2016-09-25 LAB — COMPREHENSIVE METABOLIC PANEL
ALT: 19 U/L (ref 14–54)
AST: 21 U/L (ref 15–41)
Albumin: 3.2 g/dL — ABNORMAL LOW (ref 3.5–5.0)
Alkaline Phosphatase: 61 U/L (ref 38–126)
Anion gap: 6 (ref 5–15)
BUN: 15 mg/dL (ref 6–20)
CO2: 26 mmol/L (ref 22–32)
Calcium: 8.3 mg/dL — ABNORMAL LOW (ref 8.9–10.3)
Chloride: 103 mmol/L (ref 101–111)
Creatinine, Ser: 0.87 mg/dL (ref 0.44–1.00)
GFR calc Af Amer: >60 mL/min (ref >60)
GFR calc non Af Amer: >60 mL/min (ref >60)
Glucose, Bld: 117 mg/dL — ABNORMAL HIGH (ref 65–99)
Potassium: 3.3 mmol/L — ABNORMAL LOW (ref 3.5–5.1)
Sodium: 135 mmol/L (ref 135–145)
Total Bilirubin: 0.3 mg/dL (ref 0.3–1.2)
Total Protein: 7.5 g/dL (ref 6.5–8.1)

## 2016-09-25 LAB — CBC
HCT: 28.9 % — ABNORMAL LOW (ref 36.0–46.0)
Hemoglobin: 9.3 g/dL — ABNORMAL LOW (ref 12.0–15.0)
MCH: 25.2 pg — ABNORMAL LOW (ref 26.0–34.0)
MCHC: 32.2 g/dL (ref 30.0–36.0)
MCV: 78.3 fL (ref 78.0–100.0)
Platelets: 256 10*3/uL (ref 150–400)
RBC: 3.69 MIL/uL — ABNORMAL LOW (ref 3.87–5.11)
RDW: 19.8 % — ABNORMAL HIGH (ref 11.5–15.5)
WBC: 8.2 10*3/uL (ref 4.0–10.5)

## 2016-09-25 LAB — BASIC METABOLIC PANEL
Anion gap: 6 (ref 5–15)
BUN: 8 mg/dL (ref 6–20)
CO2: 22 mmol/L (ref 22–32)
Calcium: 8.4 mg/dL — ABNORMAL LOW (ref 8.9–10.3)
Chloride: 105 mmol/L (ref 101–111)
Creatinine, Ser: 0.74 mg/dL (ref 0.44–1.00)
GFR calc Af Amer: >60 mL/min (ref >60)
GFR calc non Af Amer: >60 mL/min (ref >60)
Glucose, Bld: 122 mg/dL — ABNORMAL HIGH (ref 65–99)
Potassium: 3.6 mmol/L (ref 3.5–5.1)
Sodium: 133 mmol/L — ABNORMAL LOW (ref 135–145)

## 2016-09-25 LAB — CBC WITH DIFFERENTIAL/PLATELET
Basophils Absolute: 0.1 10*3/uL (ref 0.0–0.1)
Basophils Relative: 1 %
Eosinophils Absolute: 0.3 10*3/uL (ref 0.0–0.7)
Eosinophils Relative: 3 %
HCT: 28.4 % — ABNORMAL LOW (ref 36.0–46.0)
Hemoglobin: 9.4 g/dL — ABNORMAL LOW (ref 12.0–15.0)
Lymphocytes Relative: 43 %
Lymphs Abs: 4 10*3/uL (ref 0.7–4.0)
MCH: 25.4 pg — ABNORMAL LOW (ref 26.0–34.0)
MCHC: 33.1 g/dL (ref 30.0–36.0)
MCV: 76.8 fL — ABNORMAL LOW (ref 78.0–100.0)
Monocytes Absolute: 0.7 10*3/uL (ref 0.1–1.0)
Monocytes Relative: 7 %
Neutro Abs: 4.4 10*3/uL (ref 1.7–7.7)
Neutrophils Relative %: 46 %
Platelets: 240 10*3/uL (ref 150–400)
RBC: 3.7 MIL/uL — ABNORMAL LOW (ref 3.87–5.11)
RDW: 19.4 % — ABNORMAL HIGH (ref 11.5–15.5)
WBC: 9.4 10*3/uL (ref 4.0–10.5)

## 2016-09-25 MED ORDER — IOPAMIDOL (ISOVUE-300) INJECTION 61%
100.0000 mL | Freq: Once | INTRAVENOUS | Status: AC | PRN
  Administered 2016-09-25: 100 mL via INTRAVENOUS

## 2016-09-25 MED ORDER — SODIUM CHLORIDE 0.9% FLUSH
3.0000 mL | INTRAVENOUS | Status: DC | PRN
  Administered 2016-10-03: 3 mL via INTRAVENOUS
  Filled 2016-09-25: qty 3

## 2016-09-25 MED ORDER — CLONIDINE HCL 0.1 MG PO TABS
0.1000 mg | ORAL_TABLET | ORAL | Status: AC
  Administered 2016-09-27 – 2016-09-29 (×4): 0.1 mg via ORAL
  Filled 2016-09-25 (×4): qty 1

## 2016-09-25 MED ORDER — HYDROXYZINE HCL 25 MG PO TABS
50.0000 mg | ORAL_TABLET | Freq: Four times a day (QID) | ORAL | Status: AC | PRN
  Administered 2016-09-25 – 2016-09-30 (×8): 50 mg via ORAL
  Filled 2016-09-25 (×8): qty 2

## 2016-09-25 MED ORDER — ACETAMINOPHEN 650 MG RE SUPP: 650.0000 mg | Freq: Four times a day (QID) | RECTAL | Status: DC | PRN

## 2016-09-25 MED ORDER — ACETAMINOPHEN 325 MG PO TABS
650.0000 mg | ORAL_TABLET | Freq: Four times a day (QID) | ORAL | Status: DC | PRN
  Administered 2016-09-27 – 2016-10-14 (×12): 650 mg via ORAL
  Filled 2016-09-25 (×12): qty 2

## 2016-09-25 MED ORDER — METHOCARBAMOL 500 MG PO TABS
1000.0000 mg | ORAL_TABLET | Freq: Four times a day (QID) | ORAL | Status: AC | PRN
  Administered 2016-09-25 – 2016-09-27 (×4): 1000 mg via ORAL
  Filled 2016-09-25 (×4): qty 2

## 2016-09-25 MED ORDER — SODIUM CHLORIDE 0.9% FLUSH
3.0000 mL | Freq: Two times a day (BID) | INTRAVENOUS | Status: DC
  Administered 2016-09-25 – 2016-10-17 (×37): 3 mL via INTRAVENOUS

## 2016-09-25 MED ORDER — CLONIDINE HCL 0.1 MG PO TABS
0.1000 mg | ORAL_TABLET | Freq: Every day | ORAL | Status: AC
  Administered 2016-09-30 – 2016-10-01 (×2): 0.1 mg via ORAL
  Filled 2016-09-25 (×2): qty 1

## 2016-09-25 MED ORDER — HYDROCODONE-ACETAMINOPHEN 5-325 MG PO TABS
1.0000 | ORAL_TABLET | ORAL | Status: DC | PRN
  Administered 2016-09-25 – 2016-09-27 (×9): 1 via ORAL
  Filled 2016-09-25 (×10): qty 1

## 2016-09-25 MED ORDER — ENOXAPARIN SODIUM 40 MG/0.4ML ~~LOC~~ SOLN
40.0000 mg | SUBCUTANEOUS | Status: DC
  Administered 2016-10-02 – 2016-10-06 (×2): 40 mg via SUBCUTANEOUS
  Filled 2016-09-25 (×15): qty 0.4

## 2016-09-25 MED ORDER — SODIUM CHLORIDE 0.9 % IV SOLN
250.0000 mL | INTRAVENOUS | Status: DC | PRN
  Administered 2016-10-02: 250 mL via INTRAVENOUS
  Administered 2016-10-14: 1000 mL via INTRAVENOUS

## 2016-09-25 MED ORDER — LOPERAMIDE HCL 2 MG PO CAPS: 2.0000 mg | ORAL_CAPSULE | ORAL | Status: AC | PRN

## 2016-09-25 MED ORDER — ONDANSETRON 4 MG PO TBDP: 4.0000 mg | ORAL_TABLET | Freq: Four times a day (QID) | ORAL | Status: AC | PRN

## 2016-09-25 MED ORDER — DICYCLOMINE HCL 20 MG PO TABS
20.0000 mg | ORAL_TABLET | Freq: Four times a day (QID) | ORAL | Status: AC | PRN
Start: 2016-09-25 — End: 2016-09-30
  Filled 2016-09-25: qty 1

## 2016-09-25 MED ORDER — NAPROXEN 250 MG PO TABS
500.0000 mg | ORAL_TABLET | Freq: Two times a day (BID) | ORAL | Status: AC | PRN
  Administered 2016-09-27: 500 mg via ORAL
  Filled 2016-09-25: qty 2

## 2016-09-25 MED ORDER — VANCOMYCIN HCL IN DEXTROSE 1-5 GM/200ML-% IV SOLN
1000.0000 mg | Freq: Three times a day (TID) | INTRAVENOUS | Status: DC
  Administered 2016-09-25 – 2016-09-28 (×8): 1000 mg via INTRAVENOUS
  Filled 2016-09-25 (×11): qty 200

## 2016-09-25 MED ORDER — VANCOMYCIN HCL IN DEXTROSE 1-5 GM/200ML-% IV SOLN
1000.0000 mg | Freq: Once | INTRAVENOUS | Status: AC
  Administered 2016-09-25: 1000 mg via INTRAVENOUS
  Filled 2016-09-25: qty 200

## 2016-09-25 MED ORDER — CLONIDINE HCL 0.1 MG PO TABS
0.1000 mg | ORAL_TABLET | Freq: Four times a day (QID) | ORAL | Status: AC
  Administered 2016-09-25 – 2016-09-27 (×10): 0.1 mg via ORAL
  Filled 2016-09-25 (×10): qty 1

## 2016-09-25 NOTE — ED Provider Notes (Signed)
[place] DEPT Provider Note   CSN: [id number] Arrival date & time: 09/24/16  2306  Time seen 23:55 PM   History   Chief Complaint Chief Complaint  Patient presents with  . Fever    HPI [name] is a 46 y.o. female.  HPI  patient was admitted May 22 at [facility] with a three-day history of redevelopment of a infection in her right chest wall. She has a history of IV drug abuse and septic joint, septic emboli, and endocarditis with a hospitalization in December 2017 for abscess to her chest wall with a septic right sternoclavicular joint. She had debridement done on May 22 by Dr. Cyndia Bent. She was started on IV vancomycin and per her discharge noted that was to be continued until July. However patient left the hospital AMA on June 10. She has not been receiving any antibiotics. She states she was doing fine, however she started having increasing pain on June 13, and increasing redness on June 14. She started having fever undocumented today with some chills off and on today. She denies nausea or vomiting. She states she has COPD and has a chronic cough that's dry, and chronically is wheezing. She states she is chronically feels short of breath. She states if she uses her inhaler it helps. She states her last heroin use was yesterday at 10 AM, June 14.  Patient states she has a porcine skin graft in the area.  PCP none  Past Medical History:  Diagnosis Date  . Anxiety   . Chronic back pain   . Chronic knee pain   . COPD (chronic obstructive pulmonary disease) ([place])   . DDD (degenerative disc disease)   . Hepatitis C infection 07/13/2014  . IV drug abuse   . Nephrolithiasis   . Panic attacks   . Polysubstance abuse    cocaine, opiates, marijuana, amphetamines, "molly," crystal meth  . Pulmonary embolism [facility])     Patient Active Problem List   Diagnosis Date Noted  . Joint infection ([place]) 09/02/2016  . Cellulitis 08/31/2016  . Wound infection 03/17/2016  . Chest pain at  rest   . 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 Anxiety 02/22/2016  . Cigarette smoker 02/22/2016  . Abscess of chest ([place]) 02/21/2016  . Opioid abuse 02/21/2016  . Pulmonary embolism, septic ([place]) 02/21/2016  . Septic arthritis of right sternoclavicular joint ([place]) 02/21/2016  . COPD (chronic obstructive pulmonary disease) ([place]) 10/03/2014  . Hepatitis C infection 07/13/2014  . IV drug abuse   . Depression 06/17/2014  . Polysubstance abuse 06/16/2014    Class: Chronic  . Obesity 06/06/2012    Past Surgical History:  Procedure Laterality Date  . APPLICATION OF A-CELL OF CHEST/ABDOMEN Right 09/07/2016   Procedure: APPLICATION OF A-CELL OF CHEST/ABDOMEN;  Surgeon: Wallace Going, DO;  Location: [place];  Service: Plastics;  Laterality: Right;  . CESAREAN SECTION    . I&D EXTREMITY Right 09/02/2016   Procedure: IRRIGATION AND DEBRIDEMENT OF RIGHT STERNOCLAVICULAR JOINT, STERNUM AND RIBS;  Surgeon: Gaye Pollack, MD;  Location: [place] OR;  Service: Vascular;  Laterality: Right;  . INCISION AND DRAINAGE OF WOUND Right 09/07/2016   Procedure: IRRIGATION AND DEBRIDEMENT OF CHEST WOUND, PLACEMENT OF A CELL AND VAC;  Surgeon: Wallace Going, DO;  Location: [place];  Service: Plastics;  Laterality: Right;  . NOSE SURGERY    . STERNAL WOUND DEBRIDEMENT N/A 02/22/2016   Procedure: IRRIGATION AND DEBRIDEMENT MANUBRIUM ABSCESS with wound cultures;  Surgeon:  Melrose Nakayama, MD;  Location: [place];  Service: Thoracic;  Laterality: N/A;  . STERNAL WOUND DEBRIDEMENT N/A 02/23/2016   Procedure: DRESSING CHANGE UNDER ANESTHESIA  WITH WOUND VAC PLACEMENT;  Surgeon: Melrose Nakayama, MD;  Location: [place];  Service: Thoracic;  Laterality: N/A;  . TEE WITHOUT CARDIOVERSION N/A 02/23/2016   Procedure: TRANSESOPHAGEAL ECHOCARDIOGRAM (TEE);  Surgeon: Jerline Pain, MD;  Location: [facility] ENDOSCOPY;  Service: Cardiovascular;  Laterality: N/A;  . TUBAL LIGATION     "tied, burned, clipped"    OB  History    Gravida Para Term Preterm AB Living   2 2 2     2    SAB TAB Ectopic Multiple Live Births                   Home Medications    Prior to Admission medications   Not on File    Family History Family History  Problem Relation Age of Onset  . COPD Mother   . Hypertension Father   . Heart attack Father   . Diabetes Father   . Arthritis Unknown   . Lung disease Unknown   . Cancer Unknown   . Asthma Unknown     Social History Social History  Substance Use Topics  . Smoking status: Current Every Day Smoker    Packs/day: 1.00    Years: 32.00    Types: Cigarettes  . Smokeless tobacco: Never Used  . Alcohol use No  heroin abuser, sometimes crack Smokes 1/2 ppd   Allergies   Mango flavor; Doxycycline; Pyridium [phenazopyridine hcl]; and Tramadol   Review of Systems Review of Systems  All other systems reviewed and are negative.    Physical Exam Updated Vital Signs BP (!) 141/95 (BP Location: Right Arm)   Pulse 81   Temp 98.5 F (36.9 C) (Oral)   Resp 18   Ht 5\' 1"  (1.549 m)   Wt 74.4 kg (164 lb)   SpO2 100%   BMI 30.99 kg/m   Vital signs normal    Physical Exam  Constitutional: She is oriented to person, place, and time. She appears well-developed and well-nourished.  Non-toxic appearance. She does not appear ill. No distress.  HENT:  Head: Normocephalic and atraumatic.  Right Ear: External ear normal.  Left Ear: External ear normal.  Nose: Nose normal. No mucosal edema or rhinorrhea.  Mouth/Throat: Oropharynx is clear and moist and mucous membranes are normal. No dental abscesses or uvula swelling.  Eyes: Conjunctivae and EOM are normal. Pupils are equal, round, and reactive to light.  Neck: Normal range of motion and full passive range of motion without pain. Neck supple.  Cardiovascular: Normal rate, regular rhythm and normal heart sounds.  Exam reveals no gallop and no friction rub.   No murmur heard. Pulmonary/Chest: Effort normal. No  respiratory distress. She has wheezes. She has no rhonchi. She has no rales. She exhibits no tenderness and no crepitus.  Pt states she doesn't need a nebulizer treatment at this time.   Patient noted to have a large area in her right upper chest that's red with mild swelling. When I removed the dressing there is an area over the right [place] joint where she has had a skin graft done. It is approximately 0.5 cm x 2 cm in size. The dressing has a mild amount of drainage on it.  Abdominal: Soft. Normal appearance and bowel sounds are normal. She exhibits no distension. There is no tenderness. There is no  rebound and no guarding.  Musculoskeletal: Normal range of motion. She exhibits no edema or tenderness.  Moves all extremities well.   Neurological: She is alert and oriented to person, place, and time. She has normal strength. No cranial nerve deficit.  Skin: Skin is warm, dry and intact. No rash noted. No erythema. No pallor.  Psychiatric: She has a normal mood and affect. Her speech is normal and behavior is normal. Her mood appears not anxious.  Nursing note and vitals reviewed.        ED Treatments / Results  Labs (all labs ordered are listed, but only abnormal results are displayed) Results for orders placed or performed during the hospital encounter of 09/24/16  Culture, blood (routine x 2)  Result Value Ref Range   Specimen Description BLOOD LEFT ARM    Special Requests      BOTTLES DRAWN AEROBIC AND ANAEROBIC Blood Culture results may not be optimal due to an inadequate volume of blood received in culture bottles   Culture PENDING    Report Status PENDING   Culture, blood (routine x 2)  Result Value Ref Range   Specimen Description BLOOD RIGHT ARM    Special Requests      BOTTLES DRAWN AEROBIC AND ANAEROBIC Blood Culture adequate volume   Culture PENDING    Report Status PENDING   Comprehensive metabolic panel  Result Value Ref Range   Sodium 135 135 - 145 mmol/L   Potassium  3.3 (L) 3.5 - 5.1 mmol/L   Chloride 103 101 - 111 mmol/L   CO2 26 22 - 32 mmol/L   Glucose, Bld 117 (H) 65 - 99 mg/dL   BUN 15 6 - 20 mg/dL   Creatinine, Ser 0.87 0.44 - 1.00 mg/dL   Calcium 8.3 (L) 8.9 - 10.3 mg/dL   Total Protein 7.5 6.5 - 8.1 g/dL   Albumin 3.2 (L) 3.5 - 5.0 g/dL   AST 21 15 - 41 U/L   ALT 19 14 - 54 U/L   Alkaline Phosphatase 61 38 - 126 U/L   Total Bilirubin 0.3 0.3 - 1.2 mg/dL   GFR calc non Af Amer >60 >60 mL/min   GFR calc Af Amer >60 >60 mL/min   Anion gap 6 5 - 15  CBC with Differential  Result Value Ref Range   WBC 9.4 4.0 - 10.5 K/uL   RBC 3.70 (L) 3.87 - 5.11 MIL/uL   Hemoglobin 9.4 (L) 12.0 - 15.0 g/dL   HCT 28.4 (L) 36.0 - 46.0 %   MCV 76.8 (L) 78.0 - 100.0 fL   MCH 25.4 (L) 26.0 - 34.0 pg   MCHC 33.1 30.0 - 36.0 g/dL   RDW 19.4 (H) 11.5 - 15.5 %   Platelets 240 150 - 400 K/uL   Neutrophils Relative % 46 %   Neutro Abs 4.4 1.7 - 7.7 K/uL   Lymphocytes Relative 43 %   Lymphs Abs 4.0 0.7 - 4.0 K/uL   Monocytes Relative 7 %   Monocytes Absolute 0.7 0.1 - 1.0 K/uL   Eosinophils Relative 3 %   Eosinophils Absolute 0.3 0.0 - 0.7 K/uL   Basophils Relative 1 %   Basophils Absolute 0.1 0.0 - 0.1 K/uL    Laboratory interpretation all normal except mild hypokalemia, stable anemia    EKG  EKG Interpretation None       Radiology Ct Chest W Contrast  Result Date: 09/25/2016 CLINICAL DATA:  Right sternoclavicular joint infection. EXAM: CT CHEST WITH CONTRAST TECHNIQUE:  Multidetector CT imaging of the chest was performed during intravenous contrast administration. CONTRAST:  143mL ISOVUE-300 IOPAMIDOL (ISOVUE-300) INJECTION 61% COMPARISON:  Chest CT 08/31/2016 FINDINGS: Cardiovascular: Normal appearance of the heart and aorta. The proximal arch vessels are patent. Mediastinum/Nodes: Unchanged appearance of mildly asymmetrically enlarged right axillary lymph nodes. The visualized thyroid and thoracic esophageal course are unremarkable. Lungs/Pleura:  Focal area of atelectasis in the right middle lobe is unchanged. Upper Abdomen: The visualized portions of the upper abdominal organs are normal. Musculoskeletal: Intermediate attenuation collection at the right sternoclavicular first costo manubrial joints per cysts, having decreased in size from 8.5 by 9.6 cm compared to 6.8 x 6.5 cm on the current examination. Extensive osteolysis of the clavicle, sternum and right first rib are unchanged. Previously seen inflammatory findings in the right breast have greatly decreased. IMPRESSION: 1. Decreased size of phlegmonous area of centered at the right sternoclavicular and first costal manubrial joints associated osteolysis. 2. Unchanged appearance of prominent right axillary lymph nodes that are likely reactive. 3. Decreased inflammatory change of the right breast. Electronically Signed   By: Ulyses Jarred M.D.   On: 09/25/2016 04:12     Dg Chest 2 View  Result Date: 08/31/2016 CLINICAL DATA:  47 year old female with right side chest mass and pain. Septic right sternoclavicular and first costo manubrial joint in November 2017.IMPRESSION: 1. Right peritracheal region 5 cm masslike asymmetric density is probably related to the previously abnormal right sternoclavicular and first rib costochondral joints. 2. Small bilateral pleural effusions have resolved since December. No acute cardiopulmonary abnormality. Electronically Signed   By: Genevie Ann M.D.   On: 08/31/2016 15:46   Ct Chest W Contrast  Result Date: 08/31/2016 CLINICAL DATA:  Acute onset of shortness of breath. Recurrent right chest wall abscess. Initial encounter.  IMPRESSION: 1. Large region of phlegmon and septic effusion again noted at the right sternoclavicular and right first costomanubrial joints. New air within this collection likely reflects interval drainage of the superficial abscess. The phlegmon measures 8-9 cm, with underlying mild bony destruction and diffuse sclerosis again noted. Sclerosis  involves the right clavicle, right first rib and right side of the sternum. Mild extension of phlegmon to the superior aspect of the mediastinum. 2. Mild right-sided airspace opacity may reflect atelectasis or possibly mild pneumonia, including mild opacity underlying the right anterior chest wall phlegmon. 3. Vague soft tissue inflammation tracking about the right breast, reflecting cellulitis. 4. Mild asymmetric prominence of right axillary nodes, without significant axillary lymphadenopathy. 5. Mild splenomegaly. Electronically Signed   By: Garald Balding M.D.   On: 08/31/2016 18:43   Dg Chest Port 1 View  Result Date: 09/02/2016 CLINICAL DATA:  Central line placement . IMPRESSION: Left central line tip in the SVC. No active cardiopulmonary disease. Electronically Signed   By: Rolm Baptise M.D.   On: 09/02/2016 10:48   Procedures Procedures (including critical care time)  Medications Ordered in ED Medications  dicyclomine (BENTYL) tablet 20 mg (not administered)  hydrOXYzine (ATARAX/VISTARIL) tablet 50 mg (not administered)  loperamide (IMODIUM) capsule 2-4 mg (not administered)  methocarbamol (ROBAXIN) tablet 1,000 mg (not administered)  naproxen (NAPROSYN) tablet 500 mg (not administered)  ondansetron (ZOFRAN-ODT) disintegrating tablet 4 mg (not administered)  cloNIDine (CATAPRES) tablet 0.1 mg (not administered)    Followed by  cloNIDine (CATAPRES) tablet 0.1 mg (not administered)    Followed by  cloNIDine (CATAPRES) tablet 0.1 mg (not administered)  vancomycin (VANCOCIN) IVPB 1000 mg/200 mL premix (not administered)  vancomycin (VANCOCIN) IVPB  1000 mg/200 mL premix (0 mg Intravenous Stopped 09/25/16 0331)  iopamidol (ISOVUE-300) 61 % injection 100 mL (100 mLs Intravenous Contrast Given 09/25/16 0349)     Initial Impression / Assessment and Plan / ED Course  I have reviewed the triage vital signs and the nursing notes.  Pertinent labs & imaging results that were available during my  care of the patient were reviewed by me and considered in my medical decision making (see chart for details).  Review of her chart shows her culture from her wound in the right sternoclavicular tissue had few methicillin-resistant staph aureus, rare staph species coagulase-negative. Dr. Graylon Good, infectious disease recommended she get IV vancomycin for 8 weeks total. Patient states now she is willing to stay and be admitted.  Patient was restarted on her IV vancomycin.  2:35 AM, Dr. Shanon Brow, hospitalist, wants CT of the chest to be done.  3:45 AM nursing staff report patient is diaphoretic, she does not have a fever. I suspect she is going through narcotic withdrawal, she was started on clonidine withdrawal protocol.  04:50 AM Dr Shanon Brow, hospitalist, here in the ED, is going to transfer patient to [facility].   Final Clinical Impressions(s) / ED Diagnoses   Final diagnoses:  Pain in sternoclavicular joint, right  Septic arthritis of right sternoclavicular joint [facility])    Plan admission  [name], MD, Barbette Or, MD 09/25/16 [phone]

## 2016-09-25 NOTE — Progress Notes (Signed)
Patient seen and examined at bedside, patient admitted after midnight, please see earlier detailed admission note by Phillips Grout, MD. Briefly, patient presented after leaving AMA for septic arthritis of right sternoclavicular joint. Antibiotics started. Will consult infectious disease on Monday.   Cordelia Poche, MD [facility] 09/25/2016, 2:28 PM Pager: [phone]

## 2016-09-25 NOTE — H&P (Signed)
History and Physical    [name] DQQ:[id number] DOB: [birth year] DOA: 09/24/2016  PCP: Patient, [name]  Patient coming from: home  Chief Complaint: left AMA last Sunday, with chills  HPI:  46 yo female with history as below comes back in after leaving AMA on Sunday (5 days ago)  Pt has had wound debridement and had a wound vac which was done [facility].  ID had evaluated the pt and her culture data grew out MRSA for which 8 weeks of vancomycin was recommended.  It was decided due to patients history of active heroin IVDA that she would have to stay in hospital thru the duration of her iv abx as no LTAC or SNF would take her due to her noncompliance and AMA history. Pt reports she left AMA because they were not treating her withdrawal symptoms adequately.  She last used heroin less than 24 hours ago.  She reports she will stay this time.  I have advised her that she would not be getting controlled substances but we would help as much as possible with her withdrawal.  She is agreeable to go to [facility] at this time.   From my HPI in may last hospitalization [name] is a 46 y.o. female with medical history significant of IVDA, septic joint, septic emboli, endocardititis with recent long hospitalization in dec of 2017 for absess to chest wall with septic joint and infected valve comes in with 3 days of developing mass/absess to the same area to right chest wall with redness that has spread down to right breast with associated pain with breathing and moving right shoulder.  Pt reports when she left hospital she was sober until her mother died in 05/23/22.  She was on sub axone in [place] but transportation became an issue, then her mom died and she has been using heroin since.  She uses at least every other day, is no longer on subaxone.   She does not use needles to her chest, only to her upper extremities.     This is part of note from discharge summary on 03/25/16 by dr  Bobbie Stack from [facility] "INITIAL HISTORY: 46 y.o.femalewith a history of IV drug use (primarily heroin), hepatitis C recently admitted from 11/11-11/18 For septic arthritisand endocarditisof the R sternoclavicular joint with sternoclavicular abscess incised and drainedon 02/22/2016, subsequent VACplaced on November 14. [facility]. Cultures grew pseudomonas and comanomas -ID recommended cefepime 2 g every 12 hours for total of 6 weeks, end date was to be12/25/2017 but she left the facility 5 days ago AMA, and without antibiotics. She kept the PICC line Last IV drug use with heroine was 2 days ago. She is now presenting with chest pain, and pain in the dorsal aspect of her hand, suspicious of infection.. Denies any fever or chills night sweats, nausea or vomiting. No diarrhea. PICC line removed, culture showed MRSA.   Review of Systems: As per HPI otherwise 10 point review of systems negative.   Past Medical History:  Diagnosis Date  . Anxiety   . Chronic back pain   . Chronic knee pain   . COPD (chronic obstructive pulmonary disease) ([place])   . DDD (degenerative disc disease)   . Hepatitis C infection 07/13/2014  . IV drug abuse   . Nephrolithiasis   . Panic attacks   . Polysubstance abuse    cocaine, opiates, marijuana, amphetamines, "molly," crystal  meth  . Pulmonary embolism [facility])     Past Surgical History:  Procedure Laterality Date  . APPLICATION OF A-CELL OF CHEST/ABDOMEN Right 09/07/2016   Procedure: APPLICATION OF A-CELL OF CHEST/ABDOMEN;  Surgeon: Wallace Going, DO;  Location: [place];  Service: Plastics;  Laterality: Right;  . CESAREAN SECTION    . I&D EXTREMITY Right 09/02/2016   Procedure: IRRIGATION AND DEBRIDEMENT OF RIGHT STERNOCLAVICULAR JOINT, STERNUM AND RIBS;  Surgeon: Gaye Pollack, MD;  Location: [place] [facility];  Service: Vascular;  Laterality: Right;  . INCISION AND DRAINAGE OF WOUND Right 09/07/2016    Procedure: IRRIGATION AND DEBRIDEMENT OF CHEST WOUND, PLACEMENT OF A CELL AND VAC;  Surgeon: Wallace Going, DO;  Location: [place];  Service: Plastics;  Laterality: Right;  . NOSE SURGERY    . STERNAL WOUND DEBRIDEMENT N/A 02/22/2016   Procedure: IRRIGATION AND DEBRIDEMENT MANUBRIUM ABSCESS with wound cultures;  Surgeon: Melrose Nakayama, MD;  Location: [place];  Service: Thoracic;  Laterality: N/A;  . STERNAL WOUND DEBRIDEMENT N/A 02/23/2016   Procedure: DRESSING CHANGE UNDER ANESTHESIA  WITH WOUND VAC PLACEMENT;  Surgeon: Melrose Nakayama, MD;  Location: [place];  Service: Thoracic;  Laterality: N/A;  . TEE WITHOUT CARDIOVERSION N/A 02/23/2016   Procedure: TRANSESOPHAGEAL ECHOCARDIOGRAM (TEE);  Surgeon: Jerline Pain, MD;  Location: [facility] ENDOSCOPY;  Service: Cardiovascular;  Laterality: N/A;  . TUBAL LIGATION     "tied, burned, clipped"     reports that she has been smoking Cigarettes.  She has a 32.00 pack-year smoking history. She has never used smokeless tobacco. She reports that she uses drugs, including IV, Cocaine, Marijuana, Methamphetamines, Heroin, and Other-see comments. She reports that she does not drink alcohol.  Allergies  Allergen Reactions  . Mango Flavor Hives and Swelling  . Doxycycline Other (See Comments)    "BURNS" >> ? INCREASED SENSITIVITY TO SUNLIGHT ?  . Pyridium [Phenazopyridine Hcl] Other (See Comments)    Irritation to skin  . Tramadol Nausea And Vomiting    Family History  Problem Relation Age of Onset  . COPD Mother   . Hypertension Father   . Heart attack Father   . Diabetes Father   . Arthritis Unknown   . Lung disease Unknown   . Cancer Unknown   . Asthma Unknown     Prior to Admission medications   Not on File  none  Physical Exam: Vitals:   09/24/16 2316 09/24/16 2319 09/25/16 0255  BP:  (!) 141/95 128/78  Pulse:  81 (!) 56  Resp:  18 17  Temp:  98.5 F (36.9 C) 98.1 F (36.7 C)  TempSrc:  Oral Oral  SpO2:  100% 95%    Weight: 74.4 kg (164 lb)    Height: 5\' 1"  (1.549 m)        Constitutional: NAD, calm, comfortable Vitals:   09/24/16 2316 09/24/16 2319 09/25/16 0255  BP:  (!) 141/95 128/78  Pulse:  81 (!) 56  Resp:  18 17  Temp:  98.5 F (36.9 C) 98.1 F (36.7 C)  TempSrc:  Oral Oral  SpO2:  100% 95%  Weight: 74.4 kg (164 lb)    Height: 5\' 1"  (1.549 m)     Eyes: PERRL, lids and conjunctivae normal ENMT: Mucous membranes are moist. Posterior pharynx clear of any exudate or lesions.Normal dentition.  Neck: normal, supple, no masses, no thyromegaly Respiratory: clear to auscultation bilaterally, no wheezing, no crackles. Normal respiratory effort. No accessory muscle use.  Cardiovascular: Regular  rate and rhythm, no murmurs / rubs / gallops. No extremity edema. 2+ pedal pulses. No carotid bruits.  Abdomen: no tenderness, no masses palpated. No hepatosplenomegaly. Bowel sounds positive.  Musculoskeletal: no clubbing / cyanosis. No joint deformity upper and lower extremities. Good ROM, no contractures. Normal muscle tone.  Skin:  absess and cellulites to right chest wall  Neurologic: CN 2-12 grossly intact. Sensation intact, DTR normal. Strength 5/5 in all 4.  Psychiatric: Normal judgment and insight. Alert and oriented x 3. Normal mood.    Labs on Admission: I have personally reviewed following labs and imaging studies  CBC:  Recent Labs Lab 09/25/16 0024  WBC 9.4  NEUTROABS 4.4  HGB 9.4*  HCT 28.4*  MCV 76.8*  PLT 662   Basic Metabolic Panel:  Recent Labs Lab 09/19/16 0456 09/25/16 0024  NA 136 135  K 4.0 3.3*  CL 105 103  CO2 22 26  GLUCOSE 121* 117*  BUN 14 15  CREATININE 0.69 0.87  CALCIUM 8.7* 8.3*   GFR: Estimated Creatinine Clearance: 76.1 mL/min (by C-G formula based on SCr of 0.87 mg/dL). Liver Function Tests:  Recent Labs Lab 09/25/16 0024  AST 21  ALT 19  ALKPHOS 61  BILITOT 0.3  PROT 7.5  ALBUMIN 3.2*    Urine analysis:    Component Value  Date/Time   COLORURINE YELLOW 08/31/2016 2108   APPEARANCEUR CLEAR 08/31/2016 2108   LABSPEC 1.025 08/31/2016 2108   PHURINE 7.0 08/31/2016 2108   GLUCOSEU NEGATIVE 08/31/2016 2108   HGBUR SMALL (A) 08/31/2016 2108   BILIRUBINUR NEGATIVE 08/31/2016 2108   KETONESUR NEGATIVE 08/31/2016 2108   PROTEINUR NEGATIVE 08/31/2016 2108   UROBILINOGEN 0.2 10/18/2014 0100   NITRITE NEGATIVE 08/31/2016 2108   LEUKOCYTESUR SMALL (A) 08/31/2016 2108    Recent Results (from the past 240 hour(s))  Culture, blood (routine x 2)     Status: None (Preliminary result)   Collection Time: 09/25/16 12:24 AM  Result Value Ref Range Status   Specimen Description BLOOD LEFT ARM  Final   Special Requests   Final    BOTTLES DRAWN AEROBIC AND ANAEROBIC Blood Culture results may not be optimal due to an inadequate volume of blood received in culture bottles   Culture PENDING  Incomplete   Report Status PENDING  Incomplete  Culture, blood (routine x 2)     Status: None (Preliminary result)   Collection Time: 09/25/16 12:32 AM  Result Value Ref Range Status   Specimen Description BLOOD RIGHT ARM  Final   Special Requests   Final    BOTTLES DRAWN AEROBIC AND ANAEROBIC Blood Culture adequate volume   Culture PENDING  Incomplete   Report Status PENDING  Incomplete     Radiological Exams on Admission: Ct Chest W Contrast  Result Date: 09/25/2016 CLINICAL DATA:  Right sternoclavicular joint infection. EXAM: CT CHEST WITH CONTRAST TECHNIQUE: Multidetector CT imaging of the chest was performed during intravenous contrast administration. CONTRAST:  17mL ISOVUE-300 IOPAMIDOL (ISOVUE-300) INJECTION 61% COMPARISON:  Chest CT 08/31/2016 FINDINGS: Cardiovascular: Normal appearance of the heart and aorta. The proximal arch vessels are patent. Mediastinum/Nodes: Unchanged appearance of mildly asymmetrically enlarged right axillary lymph nodes. The visualized thyroid and thoracic esophageal course are unremarkable.  Lungs/Pleura: Focal area of atelectasis in the right middle lobe is unchanged. Upper Abdomen: The visualized portions of the upper abdominal organs are normal. Musculoskeletal: Intermediate attenuation collection at the right sternoclavicular first costo manubrial joints per cysts, having decreased in size from 8.5 by 9.6  cm compared to 6.8 x 6.5 cm on the current examination. Extensive osteolysis of the clavicle, sternum and right first rib are unchanged. Previously seen inflammatory findings in the right breast have greatly decreased. IMPRESSION: 1. Decreased size of phlegmonous area of centered at the right sternoclavicular and first costal manubrial joints associated osteolysis. 2. Unchanged appearance of prominent right axillary lymph nodes that are likely reactive. 3. Decreased inflammatory change of the right breast. Electronically Signed   By: Ulyses Jarred M.D.   On: 09/25/2016 04:12    Old chart reviewed Case discussed with dr knapp  Assessment/Plan 46 yo female with h/o IVDA heroin with multiple serious infectious complications from her drug use with most recent requiring 8 weeks of iv vancomycin  Principal Problem:   Abscess of chest ([place])- repeat ct shows some reaccumulation of fluid but smaller than last time prior to last I$D.  [facility] in case she needs ct surgery opinion again.  Restart on IV vancomycin.  Pt stable and nontoxic at this time.  Active Problems:   Polysubstance abuse- noted, last heroin use yesterday   IV drug abuse- noted   COPD (chronic obstructive pulmonary disease) (HCC)- stable   Pulmonary embolism, septic (HCC)- noted   Septic arthritis of right sternoclavicular joint (HCC)- still active, vanc iv   Endocarditis of tricuspid valve- noted, had recent echo did not show vegetation   Wound infection-noted    Pt has been advised that by her leaving AMA repeatedly she is interrupting her own care with iv abx and probably causing some resistance and further  complications in her treatment.  She claims to understand this, and is agreeable to go to [facility].     DVT prophylaxis:  lovenox Code Status:  full Family Communication: none  Disposition Plan:  Per day team  Consults called:  none Admission status:  Admission for full duration of 8 week iv abx course   Nikos Anglemyer A MD [facility]  If 7PM-7AM, please contact night-coverage [email or web address] Password [facility]  09/25/2016, 4:46 AM

## 2016-09-25 NOTE — Progress Notes (Signed)
Pharmacy Antibiotic Note  [name] is a 46 y.o. female admitted on 09/24/2016 with sternoclavicular wound.  Pharmacy has been consulted for vancomycin dosing.  Dose of vancomycin from previous hospitalization based upon trough concentrations was 1000mg  IV q8h. Will restart this dose and confirm therapeutic. Goal trough 15-20 mcg/ml.  ID recommendation during previous hospitalization was for 8 weeks of IV vanc for MRSA+ wound culture.  Plan: Vancomycin 1000mg  IV q8h Check trough at steady state  Height: 5\' 1"  (154.9 cm) Weight: 164 lb (74.4 kg) IBW/kg (Calculated) : 47.8  Temp (24hrs), Avg:98.3 F (36.8 C), Min:98.1 F (36.7 C), Max:98.5 F (36.9 C)   Recent Labs Lab 09/19/16 0456 09/25/16 0024  WBC  --  9.4  CREATININE 0.69 0.87    Estimated Creatinine Clearance: 76.1 mL/min (by C-G formula based on SCr of 0.87 mg/dL).    Allergies  Allergen Reactions  . Mango Flavor Hives and Swelling  . Doxycycline Other (See Comments)    "BURNS" >> ? INCREASED SENSITIVITY TO SUNLIGHT ?  . Pyridium [Phenazopyridine Hcl] Other (See Comments)    Irritation to skin  . Tramadol Nausea And Vomiting    Antimicrobials this admission: vanc 6/16 >>   Dose adjustments this admission: None at this time  Microbiology results: 6/16 BCx: pending    Thank you for allowing pharmacy to be a part of this patient's care.  Addison Lank [place] 09/25/2016 4:12 AM

## 2016-09-25 NOTE — ED Notes (Signed)
Pt stable and ready for transport to [facility]. Report given to Letta Pate, Therapist, sports.

## 2016-09-25 NOTE — ED Notes (Signed)
Dressing applied to wound on chest; telfa, 4x4's and medipore tape

## 2016-09-25 NOTE — Progress Notes (Signed)
Patient trasfered from [facility] to [phone] via [place]; alert and oriented x 4; complaints of pain in right shoulder (8/10) ; IV saline locked in R shoulder. Orient patient to room and unit; gave patient care guide; instructed how to use the call bell and  fall risk precautions. Will continue to monitor the patient.

## 2016-09-25 NOTE — Progress Notes (Signed)
MD was called and informed about patient's pain and her request to have stronger pain medicine. She verbalized that she will be treated like last time when she was in the hospital she will leave AMA. Will continue to monitor.

## 2016-09-26 DIAGNOSIS — I368 Other nonrheumatic tricuspid valve disorders: Secondary | ICD-10-CM

## 2016-09-26 NOTE — Progress Notes (Signed)
PROGRESS NOTE    [name]  YHC:[id number] DOB: 03-Oct-1970 DOA: 09/24/2016 PCP: Patient, [name]   Brief Narrative: [name] is a 46 y.o. female with a history of IV drug use, hepatitis C, septic arthritis, endocarditis, sternoclavicular abscess. She presents after leaving AMA for treatment of her abscess.   Assessment & Plan:   Principal Problem:   Abscess of chest ([place]) Active Problems:   Polysubstance abuse   IV drug abuse   COPD (chronic obstructive pulmonary disease) (HCC)   Pulmonary embolism, septic (HCC)   Septic arthritis of right sternoclavicular joint (HCC)   Endocarditis of tricuspid valve   Wound infection   Abscess of chest Septic arthritis of right sternoclavicular joint Previous plans to treat with vancomycin for a total of 8 weeks. Previous attempts to transfer to SNF or LTAC failed. Not a candidate for PICC at home. -continue vancomycin -ID consult in AM -wound care  IV drug abuse  COPD Stable.  History of endocarditis Last TEE significant for no vegetations.   DVT prophylaxis: Lovenox Code Status: Full code Family Communication: None at bedside Disposition Plan: Discharge in several weeks pending full antibiotic course   Consultants:   None  Procedures:   None  Antimicrobials:  Vancomycin    Subjective: Patient reports no pain or drainage.  Objective: Vitals:   09/25/16 2140 09/25/16 2237 09/26/16 0536 09/26/16 0700  BP: 116/70  139/73   Pulse: (!) 43 74 (!) 44 78  Resp: 18  18   Temp: 97.9 F (36.6 C)  98.2 F (36.8 C)   TempSrc:      SpO2: 100%  100%   Weight:      Height:        Intake/Output Summary (Last 24 hours) at 09/26/16 1359 Last data filed at 09/26/16 1158  Gross per 24 hour  Intake              870 ml  Output                0 ml  Net              870 ml   Filed Weights   09/24/16 2316 09/25/16 0957  Weight: 74.4 kg (164 lb) 78.2 kg (172 lb 4.8 oz)    Examination:  General exam:  Appears calm and comfortable Respiratory system: Clear to auscultation. Respiratory effort normal. Cardiovascular system: S1 & S2 heard, RRR. No murmurs. Gastrointestinal system: Abdomen is nondistended, soft and nontender. Normal bowel sounds heard. Central nervous system: Alert and oriented. No focal neurological deficits. Extremities: No edema. No calf tenderness Skin: Right upper chest wound significant for ulcer with no active discharge and no surrounding erythema. Psychiatry: Judgement and insight appear normal. Mood & affect appropriate.     Data Reviewed: I have personally reviewed following labs and imaging studies  CBC:  Recent Labs Lab 09/25/16 0024 09/25/16 1121  WBC 9.4 8.2  NEUTROABS 4.4  --   HGB 9.4* 9.3*  HCT 28.4* 28.9*  MCV 76.8* 78.3  PLT 240 517   Basic Metabolic Panel:  Recent Labs Lab 09/25/16 0024 09/25/16 1121  NA 135 133*  K 3.3* 3.6  CL 103 105  CO2 26 22  GLUCOSE 117* 122*  BUN 15 8  CREATININE 0.87 0.74  CALCIUM 8.3* 8.4*   GFR: Estimated Creatinine Clearance: 85 mL/min (by C-G formula based on SCr of 0.74 mg/dL). Liver Function Tests:  Recent Labs Lab 09/25/16 0024  AST  21  ALT 19  ALKPHOS 61  BILITOT 0.3  PROT 7.5  ALBUMIN 3.2*   No results for input(s): LIPASE, AMYLASE in the last 168 hours. No results for input(s): AMMONIA in the last 168 hours. Coagulation Profile: No results for input(s): INR, PROTIME in the last 168 hours. Cardiac Enzymes: No results for input(s): CKTOTAL, CKMB, CKMBINDEX, TROPONINI in the last 168 hours. BNP (last 3 results) No results for input(s): PROBNP in the last 8760 hours. HbA1C: No results for input(s): HGBA1C in the last 72 hours. CBG: No results for input(s): GLUCAP in the last 168 hours. Lipid Profile: No results for input(s): CHOL, HDL, LDLCALC, TRIG, CHOLHDL, LDLDIRECT in the last 72 hours. Thyroid Function Tests: No results for input(s): TSH, T4TOTAL, FREET4, T3FREE, THYROIDAB in  the last 72 hours. Anemia Panel: No results for input(s): VITAMINB12, FOLATE, FERRITIN, TIBC, IRON, RETICCTPCT in the last 72 hours. Sepsis Labs: No results for input(s): PROCALCITON, LATICACIDVEN in the last 168 hours.  Recent Results (from the past 240 hour(s))  Culture, blood (routine x 2)     Status: None (Preliminary result)   Collection Time: 09/25/16 12:24 AM  Result Value Ref Range Status   Specimen Description BLOOD LEFT ARM  Final   Special Requests   Final    BOTTLES DRAWN AEROBIC AND ANAEROBIC Blood Culture results may not be optimal due to an inadequate volume of blood received in culture bottles   Culture NO GROWTH 1 DAY  Final   Report Status PENDING  Incomplete  Culture, blood (routine x 2)     Status: None (Preliminary result)   Collection Time: 09/25/16 12:32 AM  Result Value Ref Range Status   Specimen Description BLOOD RIGHT ARM  Final   Special Requests   Final    BOTTLES DRAWN AEROBIC AND ANAEROBIC Blood Culture adequate volume   Culture NO GROWTH 1 DAY  Final   Report Status PENDING  Incomplete         Radiology Studies: Ct Chest W Contrast  Result Date: 09/25/2016 CLINICAL DATA:  Right sternoclavicular joint infection. EXAM: CT CHEST WITH CONTRAST TECHNIQUE: Multidetector CT imaging of the chest was performed during intravenous contrast administration. CONTRAST:  149mL ISOVUE-300 IOPAMIDOL (ISOVUE-300) INJECTION 61% COMPARISON:  Chest CT 08/31/2016 FINDINGS: Cardiovascular: Normal appearance of the heart and aorta. The proximal arch vessels are patent. Mediastinum/Nodes: Unchanged appearance of mildly asymmetrically enlarged right axillary lymph nodes. The visualized thyroid and thoracic esophageal course are unremarkable. Lungs/Pleura: Focal area of atelectasis in the right middle lobe is unchanged. Upper Abdomen: The visualized portions of the upper abdominal organs are normal. Musculoskeletal: Intermediate attenuation collection at the right sternoclavicular  first costo manubrial joints per cysts, having decreased in size from 8.5 by 9.6 cm compared to 6.8 x 6.5 cm on the current examination. Extensive osteolysis of the clavicle, sternum and right first rib are unchanged. Previously seen inflammatory findings in the right breast have greatly decreased. IMPRESSION: 1. Decreased size of phlegmonous area of centered at the right sternoclavicular and first costal manubrial joints associated osteolysis. 2. Unchanged appearance of prominent right axillary lymph nodes that are likely reactive. 3. Decreased inflammatory change of the right breast. Electronically Signed   By: Ulyses Jarred M.D.   On: 09/25/2016 04:12        Scheduled Meds: . cloNIDine  0.1 mg Oral QID   Followed by  . [START ON 09/27/2016] cloNIDine  0.1 mg Oral BH-qamhs   Followed by  . [START ON 09/30/2016] cloNIDine  0.1 mg Oral QAC breakfast  . enoxaparin (LOVENOX) injection  40 mg Subcutaneous Q24H  . sodium chloride flush  3 mL Intravenous Q12H   Continuous Infusions: . sodium chloride    . vancomycin Stopped (09/26/16 1034)     LOS: 1 day     Cordelia Poche, MD [facility] 09/26/2016, 1:59 PM Pager: [phone]  If 7PM-7AM, please contact night-coverage [email or web address] Password [email or web address] 09/26/2016, 1:59 PM

## 2016-09-27 LAB — BASIC METABOLIC PANEL
Anion gap: 7 (ref 5–15)
BUN: 11 mg/dL (ref 6–20)
CO2: 21 mmol/L — ABNORMAL LOW (ref 22–32)
Calcium: 8.1 mg/dL — ABNORMAL LOW (ref 8.9–10.3)
Chloride: 105 mmol/L (ref 101–111)
Creatinine, Ser: 0.97 mg/dL (ref 0.44–1.00)
GFR calc Af Amer: >60 mL/min (ref >60)
GFR calc non Af Amer: >60 mL/min (ref >60)
Glucose, Bld: 113 mg/dL — ABNORMAL HIGH (ref 65–99)
Potassium: 3.5 mmol/L (ref 3.5–5.1)
Sodium: 133 mmol/L — ABNORMAL LOW (ref 135–145)

## 2016-09-27 LAB — CBC
HCT: 30.4 % — ABNORMAL LOW (ref 36.0–46.0)
Hemoglobin: 9.8 g/dL — ABNORMAL LOW (ref 12.0–15.0)
MCH: 25.5 pg — ABNORMAL LOW (ref 26.0–34.0)
MCHC: 32.2 g/dL (ref 30.0–36.0)
MCV: 79.2 fL (ref 78.0–100.0)
Platelets: 266 10*3/uL (ref 150–400)
RBC: 3.84 MIL/uL — ABNORMAL LOW (ref 3.87–5.11)
RDW: 19.6 % — ABNORMAL HIGH (ref 11.5–15.5)
WBC: 10.5 10*3/uL (ref 4.0–10.5)

## 2016-09-27 MED ORDER — HYDROCODONE-ACETAMINOPHEN 5-325 MG PO TABS: 1.0000 | ORAL_TABLET | Freq: Four times a day (QID) | ORAL | Status: DC | PRN

## 2016-09-27 MED ORDER — BUPRENORPHINE HCL-NALOXONE HCL 8-2 MG SL SUBL
1.0000 | SUBLINGUAL_TABLET | Freq: Every day | SUBLINGUAL | Status: DC
  Administered 2016-09-27 – 2016-10-08 (×12): 1 via SUBLINGUAL
  Filled 2016-09-27 (×12): qty 1

## 2016-09-27 NOTE — Progress Notes (Signed)
PROGRESS NOTE    [name]  JSH:[id number] DOB: 1970/08/26 DOA: 09/24/2016 PCP: Patient, [name]   Brief Narrative: [name] is a 46 y.o. female with a history of IV drug use, hepatitis C, septic arthritis, endocarditis, sternoclavicular abscess. She presents after leaving AMA for treatment of her abscess.   Assessment & Plan:   Principal Problem:   Abscess of chest ([place]) Active Problems:   Polysubstance abuse   IV drug abuse   COPD (chronic obstructive pulmonary disease) (HCC)   Pulmonary embolism, septic (HCC)   Septic arthritis of right sternoclavicular joint (HCC)   Endocarditis of tricuspid valve   Wound infection   Abscess of chest Septic arthritis of right sternoclavicular joint Previous plans to treat with vancomycin for a total of 8 weeks. Previous attempts to transfer to SNF or LTAC failed. Not a candidate for PICC at home. -continue vancomycin -ID consult -wound care  IV drug abuse Current user. Counseled. Uses cocaine and heroine -suboxone  COPD Stable.  History of endocarditis Last TEE on 09/02/2016 significant for no vegetations.   DVT prophylaxis: Lovenox Code Status: Full code Family Communication: None at bedside Disposition Plan: Discharge in several weeks pending full antibiotic course   Consultants:   Infectious disease  Procedures:   None  Antimicrobials:  Vancomycin    Subjective: Patient reports no pain or drainage.  Objective: Vitals:   09/26/16 0700 09/26/16 1559 09/26/16 2214 09/27/16 0530  BP:  127/67 104/69 111/61  Pulse: 78 (!) 51 68 73  Resp:  16 18 18   Temp:  98.6 F (37 C) 98 F (36.7 C) 99.8 F (37.7 C)  TempSrc:      SpO2:  95% 100% 99%  Weight:      Height:        Intake/Output Summary (Last 24 hours) at 09/27/16 0956 Last data filed at 09/26/16 1711  Gross per 24 hour  Intake              440 ml  Output                0 ml  Net              440 ml   Filed Weights   09/24/16 2316  09/25/16 0957  Weight: 74.4 kg (164 lb) 78.2 kg (172 lb 4.8 oz)    Examination:  General exam: Appears calm and comfortable Respiratory system: Clear to auscultation. Respiratory effort normal. Cardiovascular system: S1 & S2 heard, RRR. No murmurs. Gastrointestinal system: Abdomen is nondistended, soft and nontender. Normal bowel sounds heard. Central nervous system: Alert and oriented. No focal neurological deficits. Extremities: No edema. No calf tenderness Skin: Right upper chest wound significant for ulcer with no active discharge and no surrounding erythema. Dressing is clean and intact. Psychiatry: Judgement and insight appear normal. Mood & affect appropriate.   Exam unchanged from 09/27/2016   Data Reviewed: I have personally reviewed following labs and imaging studies  CBC:  Recent Labs Lab 09/25/16 0024 09/25/16 1121 09/27/16 0615  WBC 9.4 8.2 10.5  NEUTROABS 4.4  --   --   HGB 9.4* 9.3* 9.8*  HCT 28.4* 28.9* 30.4*  MCV 76.8* 78.3 79.2  PLT 240 256 850   Basic Metabolic Panel:  Recent Labs Lab 09/25/16 0024 09/25/16 1121 09/27/16 0615  NA 135 133* 133*  K 3.3* 3.6 3.5  CL 103 105 105  CO2 26 22 21*  GLUCOSE 117* 122* 113*  BUN 15  8 11  CREATININE 0.87 0.74 0.97  CALCIUM 8.3* 8.4* 8.1*   GFR: Estimated Creatinine Clearance: 70.1 mL/min (by C-G formula based on SCr of 0.97 mg/dL). Liver Function Tests:  Recent Labs Lab 09/25/16 0024  AST 21  ALT 19  ALKPHOS 61  BILITOT 0.3  PROT 7.5  ALBUMIN 3.2*    Recent Results (from the past 240 hour(s))  Culture, blood (routine x 2)     Status: None (Preliminary result)   Collection Time: 09/25/16 12:24 AM  Result Value Ref Range Status   Specimen Description BLOOD LEFT ARM  Final   Special Requests   Final    BOTTLES DRAWN AEROBIC AND ANAEROBIC Blood Culture results may not be optimal due to an inadequate volume of blood received in culture bottles   Culture NO GROWTH 2 DAYS  Final   Report Status  PENDING  Incomplete  Culture, blood (routine x 2)     Status: None (Preliminary result)   Collection Time: 09/25/16 12:32 AM  Result Value Ref Range Status   Specimen Description BLOOD RIGHT ARM  Final   Special Requests   Final    BOTTLES DRAWN AEROBIC AND ANAEROBIC Blood Culture adequate volume   Culture NO GROWTH 2 DAYS  Final   Report Status PENDING  Incomplete         Radiology Studies: No results found.      Scheduled Meds: . cloNIDine  0.1 mg Oral QID   Followed by  . cloNIDine  0.1 mg Oral BH-qamhs   Followed by  . [START ON 09/30/2016] cloNIDine  0.1 mg Oral QAC breakfast  . enoxaparin (LOVENOX) injection  40 mg Subcutaneous Q24H  . sodium chloride flush  3 mL Intravenous Q12H   Continuous Infusions: . sodium chloride    . vancomycin 1,000 mg (09/27/16 0617)     LOS: 2 days     Cordelia Poche, MD [facility] 09/27/2016, 9:56 AM Pager: [phone]  If 7PM-7AM, please contact night-coverage [email or web address] Password [email or web address] 09/27/2016, 9:56 AM

## 2016-09-27 NOTE — Consult Note (Signed)
[facility] for Infectious Disease         Reason for Consult: Right SCV joint septic arthritis/osteomyelitis     Referring Physician: Dr. Lonny Prude   HPI: [name] is a 46 y.o. woman well known to our service with PMHx of IV heroin abuse, hepatitis C infection, tricuspid valve endocarditis, and complicated hx of recurrent right sternoclavicular joint septic arthritis s/p I&D on 5/24 and then Acell placement on 5/29 who left AMA on 6/10 and now readmitted on 6/15 after presenting with chest wall pain and redness, and fevers/chills. Prior to leaving AMA, she was recommended to complete 8 weeks total of IV Vancomycin. She completed 17/56 days of Vancomycin in the hospital before leaving AMA. She was not a candidate for SNF or LTAC due to her hx of IV drug abuse.   She reports while out of the hospital she did take some antibiotics at home that she had leftover from a previous hospital admission. She thinks it may have been Bactrim. She reports taking 2-3 pills daily. She reports she last used heroin 5 days ago. She reports feeling "terrible" and describes lethargy, headache, and anxiousness. She states her family made her come back to the hospital to finish her IV antibiotics. She admits she thinks it will be difficult for her to stay in the hospital this time as well. She reports doing well with Suboxone in the past.  Past Medical History:  Diagnosis Date  . Anxiety   . Chronic back pain   . Chronic knee pain   . COPD (chronic obstructive pulmonary disease) ([place])   . DDD (degenerative disc disease)   . Hepatitis C infection 07/13/2014  . IV drug abuse   . Nephrolithiasis   . Panic attacks   . Polysubstance abuse    cocaine, opiates, marijuana, amphetamines, "molly," crystal meth  . Pulmonary embolism (HCC)     Allergies:  Allergies  Allergen Reactions  . Mango Flavor Hives and Swelling  . Doxycycline Other (See Comments)    "BURNS" >> ? INCREASED SENSITIVITY TO SUNLIGHT ?  .  Pyridium [Phenazopyridine Hcl] Other (See Comments)    Irritation to skin  . Tramadol Nausea And Vomiting    Current antibiotics:   MEDICATIONS: . cloNIDine  0.1 mg Oral QID   Followed by  . cloNIDine  0.1 mg Oral BH-qamhs   Followed by  . [START ON 09/30/2016] cloNIDine  0.1 mg Oral QAC breakfast  . enoxaparin (LOVENOX) injection  40 mg Subcutaneous Q24H  . sodium chloride flush  3 mL Intravenous Q12H    Social History  Substance Use Topics  . Smoking status: Current Every Day Smoker    Packs/day: 1.00    Years: 32.00    Types: Cigarettes  . Smokeless tobacco: Never Used  . Alcohol use No    Family History  Problem Relation Age of Onset  . COPD Mother   . Hypertension Father   . Heart attack Father   . Diabetes Father   . Arthritis Unknown   . Lung disease Unknown   . Cancer Unknown   . Asthma Unknown     Review of Systems -  All negative except per HPI   OBJECTIVE: Temp:  [98 F (36.7 C)-99.8 F (37.7 C)] 99.8 F (37.7 C) (06/18 0530) Pulse Rate:  [51-73] 63 (06/18 1019) Resp:  [16-18] 18 (06/18 0530) BP: (104-127)/(61-69) 127/67 (06/18 1019) SpO2:  [95 %-100 %] 99 % (06/18 0530)  General: Well-nourished woman sitting  up in bed, NAD HEENT: [place]/AT, EOMI, sclera anicteric, mucus membranes moist, no thrush present CV: RRR, no m/g/r Pulm: CTA bilaterally, breaths non-labroed Abd: BS+, soft, non-tender Ext: warm, no peripheral edema, moves all without difficulty Neuro: alert and oriented x 3. Strength 5/5 in upper and lower extremities. Skin: Right SCV joint covered with a clean bandage. Under the bandage there is a small circular wound with purulent yellow drainage. Minimal surrounding erythema present.  LABS: Results for orders placed or performed during the hospital encounter of 09/24/16 (from the past 48 hour(s))  CBC     Status: Abnormal   Collection Time: 09/25/16 11:21 AM  Result Value Ref Range   WBC 8.2 4.0 - 10.5 K/uL   RBC 3.69 (L) 3.87 -  5.11 MIL/uL   Hemoglobin 9.3 (L) 12.0 - 15.0 g/dL   HCT 28.9 (L) 36.0 - 46.0 %   MCV 78.3 78.0 - 100.0 fL   MCH 25.2 (L) 26.0 - 34.0 pg   MCHC 32.2 30.0 - 36.0 g/dL   RDW 19.8 (H) 11.5 - 15.5 %   Platelets 256 150 - 400 K/uL  Basic metabolic panel     Status: Abnormal   Collection Time: 09/25/16 11:21 AM  Result Value Ref Range   Sodium 133 (L) 135 - 145 mmol/L   Potassium 3.6 3.5 - 5.1 mmol/L   Chloride 105 101 - 111 mmol/L   CO2 22 22 - 32 mmol/L   Glucose, Bld 122 (H) 65 - 99 mg/dL   BUN 8 6 - 20 mg/dL   Creatinine, Ser 0.74 0.44 - 1.00 mg/dL   Calcium 8.4 (L) 8.9 - 10.3 mg/dL   GFR calc non Af Amer >60 >60 mL/min   GFR calc Af Amer >60 >60 mL/min    Comment: (NOTE) The eGFR has been calculated using the CKD EPI equation. This calculation has not been validated in all clinical situations. eGFR's persistently <60 mL/min signify possible Chronic Kidney Disease.    Anion gap 6 5 - 15  Basic metabolic panel     Status: Abnormal   Collection Time: 09/27/16  6:15 AM  Result Value Ref Range   Sodium 133 (L) 135 - 145 mmol/L   Potassium 3.5 3.5 - 5.1 mmol/L   Chloride 105 101 - 111 mmol/L   CO2 21 (L) 22 - 32 mmol/L   Glucose, Bld 113 (H) 65 - 99 mg/dL   BUN 11 6 - 20 mg/dL   Creatinine, Ser 0.97 0.44 - 1.00 mg/dL   Calcium 8.1 (L) 8.9 - 10.3 mg/dL   GFR calc non Af Amer >60 >60 mL/min   GFR calc Af Amer >60 >60 mL/min    Comment: (NOTE) The eGFR has been calculated using the CKD EPI equation. This calculation has not been validated in all clinical situations. eGFR's persistently <60 mL/min signify possible Chronic Kidney Disease.    Anion gap 7 5 - 15  CBC     Status: Abnormal   Collection Time: 09/27/16  6:15 AM  Result Value Ref Range   WBC 10.5 4.0 - 10.5 K/uL   RBC 3.84 (L) 3.87 - 5.11 MIL/uL   Hemoglobin 9.8 (L) 12.0 - 15.0 g/dL   HCT 30.4 (L) 36.0 - 46.0 %   MCV 79.2 78.0 - 100.0 fL   MCH 25.5 (L) 26.0 - 34.0 pg   MCHC 32.2 30.0 - 36.0 g/dL   RDW 19.6 (H)  11.5 - 15.5 %   Platelets 266 150 - 400 K/uL  MICRO: Blood cx 6/16>> NGTD  IMAGING: CT chest wo contrast 6/16: IMPRESSION: 1. Decreased size of phlegmonous area of centered at the right sternoclavicular and first costal manubrial joints associated osteolysis. 2. Unchanged appearance of prominent right axillary lymph nodes that are likely reactive. 3. Decreased inflammatory change of the right breast.  HISTORICAL MICRO/IMAGING Abscess 5/24>> MRSA Right SCV tissue 5/24>> MRSA, coagulase neg staph  Assessment/Plan:    Right SCV joint septic arthritis/osteomyelitis: Tissue cx from 5/24 grew MRSA and coagulase negative Staph. She only completed 17/56 days of antibiotic therapy before leaving AMA. Blood cx from 6/16 remain negative. Recommend to still do total of 8 weeks IV Vancomycin and can include previous days of therapy (end date: through July 24th). Monitor kidney function. Consider reconsulting Dr. Marla Roe with plastic surgery given the purulence of the wound, may need to be debrided.   Opioid dependence: A crucial component of her getting her IV antibiotic therapy will be to control her opioid dependence. Agree with starting Suboxone. We had a long discussion about the need for staying at the hospital to complete her treatment and that using IV drugs places her at risk for developing this infection again.   Attending note to follow. Thank you for this interesting consult!  Albin Felling, MD, MPH Internal Medicine Resident, PGY-III Pager: [phone]

## 2016-09-27 NOTE — Consult Note (Signed)
[facility] Nurse wound consult note Reason for Consult: right clavicle wound,  Wound type:surgical;s/p debridement and placement of Acell  Pressure Injury POA: No Measurement: 5cm x 2.5cm x 0.2cm  Wound DSK:AJGO, but pale, with some yellow  Drainage (amount, consistency, odor) minimal Periwound: intact, less erythema noted than in admission photo Dressing procedure/placement/frequency: Foam dressing, change every 3 days.   Follow up with plastic surgery or CVTS as scheduled.  Discussed POC with patient and bedside nurse.  Re consult if needed, will not follow at this time. Thanks  Mariane Burpee [employer], RN,CWOCN, CNS, [place] [phone])

## 2016-09-28 LAB — VANCOMYCIN, TROUGH: Vancomycin Tr: 22 ug/mL (ref 15–20)

## 2016-09-28 MED ORDER — VANCOMYCIN HCL IN DEXTROSE 750-5 MG/150ML-% IV SOLN
750.0000 mg | Freq: Three times a day (TID) | INTRAVENOUS | Status: DC
  Administered 2016-09-28 – 2016-10-16 (×52): 750 mg via INTRAVENOUS
  Filled 2016-09-28 (×55): qty 150

## 2016-09-28 NOTE — Progress Notes (Signed)
Pharmacy Antibiotic Note  [name] is a 46 y.o. female admitted on 09/24/2016 with sternoclavicular wound.  Pharmacy was consulted for vancomycin dosing. 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 Dose of vancomycin from previous hospitalization based upon trough concentrations was 1000mg  IV q8h. Restarted Vancomycin at  this dose with Goal trough 15-20 mcg/ml. Today 09/28/16 the vancomycin trough = 22 mcg/ml. Slightly above goal on 1g IV q8h Dose charted appropriately.  I will decerase the vancomycin dose to keep the vanc trough = 15-20 mcg/ml.    ID recommendation during previous hospitalization was for 8 weeks of IV vanc for MRSA+ wound culture.  ID plans- last day of vancomycin therapy extended through 7/24 per ID note 6/18  Plan: Decrease Vancomycin dose to 750mg  IV q8h Check trough at steady state, renal function, daily culture results review. ID plans- last day of vancomycin therapy extended through 11/02/16 per ID note 6/18  Height: 5\' 1"  (154.9 cm) Weight: 172 lb 4.8 oz (78.2 kg) IBW/kg (Calculated) : 47.8  Temp (24hrs), Avg:97.9 F (36.6 C), Min:97.8 F (36.6 C), Max:98 F (36.7 C)   Recent Labs Lab 09/25/16 0024 09/25/16 1121 09/27/16 0615 09/28/16 1245  WBC 9.4 8.2 10.5  --   CREATININE 0.87 0.74 0.97  --   VANCOTROUGH  --   --   --  22*    Estimated Creatinine Clearance: 70.1 mL/min (by C-G formula based on SCr of 0.97 mg/dL).    Allergies  Allergen Reactions  . Mango Flavor Hives and Swelling  . Doxycycline Other (See Comments)    "BURNS" >> ? INCREASED SENSITIVITY TO SUNLIGHT ?  . Pyridium [Phenazopyridine Hcl] Other (See Comments)    Irritation to skin  . Tramadol Nausea And Vomiting    Antimicrobials this admission: vanc 6/16 >>   Dose adjustments this admission: None at  this time  Microbiology results: 6/16 BCx: pending    Thank you for allowing pharmacy to be a part of this patient's care.  Nicole Cella [facility] 09/28/2016 2:06 PM

## 2016-09-28 NOTE — Progress Notes (Signed)
PROGRESS NOTE    [name]  NWG:[id number] DOB: [birth year] DOA: 09/24/2016 PCP: Patient, [name]   Brief Narrative: [name] is a 46 y.o. female with a history of IV drug use, hepatitis C, septic arthritis, endocarditis, sternoclavicular abscess. She presents after leaving AMA for treatment of her abscess.   Assessment & Plan:   Principal Problem:   Abscess of chest ([place]) Active Problems:   Polysubstance abuse   IV drug abuse   COPD (chronic obstructive pulmonary disease) (HCC)   Pulmonary embolism, septic (HCC)   Septic arthritis of right sternoclavicular joint (HCC)   Endocarditis of tricuspid valve   Wound infection   Abscess of chest Septic arthritis of right sternoclavicular joint Previous plans to treat with vancomycin for a total of 8 weeks. Previous attempts to transfer to SNF or LTAC failed. Not a candidate for PICC at home. -continue vancomycin -ID consulted -wound care: plastic surgery consulted  IV drug abuse Current user. Counseled. Uses cocaine and heroine -suboxone  COPD Stable.  History of endocarditis Last TEE on 09/02/2016 significant for no vegetations.   DVT prophylaxis: Lovenox Code Status: Full code Family Communication: None at bedside Disposition Plan: Discharge in several weeks pending full antibiotic course   Consultants:   Infectious disease  Procedures:   None  Antimicrobials:  Vancomycin    Subjective: No complaints. Afebrile  Objective: Vitals:   09/27/16 1400 09/27/16 2203 09/28/16 0517 09/28/16 0936  BP: 125/69 115/68 123/68 137/83  Pulse: 60 99 (!) 41   Resp: 18 18 18    Temp: 97.4 F (36.3 C) 98 F (36.7 C) 97.8 F (36.6 C)   TempSrc: Oral Oral    SpO2: 100% 100% 100%   Weight:      Height:        Intake/Output Summary (Last 24 hours) at 09/28/16 1104 Last data filed at 09/27/16 1600  Gross per 24 hour  Intake              222 ml  Output                0 ml  Net              222 ml    Filed Weights   09/24/16 2316 09/25/16 0957  Weight: 74.4 kg (164 lb) 78.2 kg (172 lb 4.8 oz)    Examination:  General exam: Appears calm and comfortable Respiratory system: Respiratory effort normal. Central nervous system: Alert and oriented. Extremities: No edema. No calf tenderness Skin: Right upper chest wound dressing is clean and intact. Psychiatry: Judgement and insight appear normal. Mood & affect appropriate.    Data Reviewed: I have personally reviewed following labs and imaging studies  CBC:  Recent Labs Lab 09/25/16 0024 09/25/16 1121 09/27/16 0615  WBC 9.4 8.2 10.5  NEUTROABS 4.4  --   --   HGB 9.4* 9.3* 9.8*  HCT 28.4* 28.9* 30.4*  MCV 76.8* 78.3 79.2  PLT 240 256 578   Basic Metabolic Panel:  Recent Labs Lab 09/25/16 0024 09/25/16 1121 09/27/16 0615  NA 135 133* 133*  K 3.3* 3.6 3.5  CL 103 105 105  CO2 26 22 21*  GLUCOSE 117* 122* 113*  BUN 15 8 11   CREATININE 0.87 0.74 0.97  CALCIUM 8.3* 8.4* 8.1*   GFR: Estimated Creatinine Clearance: 70.1 mL/min (by C-G formula based on SCr of 0.97 mg/dL). Liver Function Tests:  Recent Labs Lab 09/25/16 0024  AST 21  ALT 19  ALKPHOS 61  BILITOT 0.3  PROT 7.5  ALBUMIN 3.2*    Recent Results (from the past 240 hour(s))  Culture, blood (routine x 2)     Status: None (Preliminary result)   Collection Time: 09/25/16 12:24 AM  Result Value Ref Range Status   Specimen Description BLOOD LEFT ARM  Final   Special Requests   Final    BOTTLES DRAWN AEROBIC AND ANAEROBIC Blood Culture results may not be optimal due to an inadequate volume of blood received in culture bottles   Culture NO GROWTH 3 DAYS  Final   Report Status PENDING  Incomplete  Culture, blood (routine x 2)     Status: None (Preliminary result)   Collection Time: 09/25/16 12:32 AM  Result Value Ref Range Status   Specimen Description BLOOD RIGHT ARM  Final   Special Requests   Final    BOTTLES DRAWN AEROBIC AND ANAEROBIC Blood  Culture adequate volume   Culture NO GROWTH 3 DAYS  Final   Report Status PENDING  Incomplete         Radiology Studies: No results found.      Scheduled Meds: . buprenorphine-naloxone  1 tablet Sublingual Daily  . cloNIDine  0.1 mg Oral BH-qamhs   Followed by  . [START ON 09/30/2016] cloNIDine  0.1 mg Oral QAC breakfast  . enoxaparin (LOVENOX) injection  40 mg Subcutaneous Q24H  . sodium chloride flush  3 mL Intravenous Q12H   Continuous Infusions: . sodium chloride    . vancomycin Stopped (09/28/16 0650)     LOS: 3 days     Cordelia Poche, MD [facility] 09/28/2016, 11:04 AM Pager: [phone]  If 7PM-7AM, please contact night-coverage [email or web address] Password [email or web address] 09/28/2016, 11:04 AM

## 2016-09-28 NOTE — Progress Notes (Signed)
    [facility] for Infectious Disease    Date of Admission:  09/24/2016     Reason for visit:  Follow up for right SCV joint septic arthritis/osteomyelitis   Subjective: Denies any tenderness near the SCV joint. Upset that she was not told her Suboxone was in her cup of pills and took this orally, not sublingually. Wound vac to be applied today.   Medications:  . buprenorphine-naloxone  1 tablet Sublingual Daily  . cloNIDine  0.1 mg Oral BH-qamhs   Followed by  . [START ON 09/30/2016] cloNIDine  0.1 mg Oral QAC breakfast  . enoxaparin (LOVENOX) injection  40 mg Subcutaneous Q24H  . sodium chloride flush  3 mL Intravenous Q12H    Objective: Vital signs in last 24 hours: Temp:  [97.4 F (36.3 C)-98 F (36.7 C)] 97.8 F (36.6 C) (06/19 0517) Pulse Rate:  [41-99] 41 (06/19 0517) Resp:  [18] 18 (06/19 0517) BP: (115-137)/(68-83) 137/83 (06/19 0936) SpO2:  [100 %] 100 % (06/19 0517)  General: Well nourished woman in NAD Skin: Erythematous at right SCV joint, minimal purulence, no tenderness   Lab Results  Recent Labs  09/27/16 0615  WBC 10.5  HGB 9.8*  HCT 30.4*  NA 133*  K 3.5  CL 105  CO2 21*  BUN 11  CREATININE 0.97     Assessment/Plan:  Right SCV joint septic arthritis/osteomyelitis: Continue Vancomycin through July 24th to complete 8 weeks total. Plastic surgery evaluated patient today and plans to place wound vac. Surgery felt wound looked good and patient may need more Acell in future.   Opioid dependence: Continue Suboxone. Will need to ensure nursing keeps her Suboxone separate from other pills so she takes correctly (sublingual).  Attending note to follow  Albin Felling, MD, MPH Internal Medicine Resident, PGY-III Pager: [phone] 09/28/2016, 1:23 PM

## 2016-09-28 NOTE — Consult Note (Signed)
Reason for Consult:right chest wound Referring Physician: Dr. Cordelia Poche  [name] is an 46 y.o. female.  HPI: The patient is a 46 yrs old wf here for treatment of a chest wound.  She complained of pain in the ED.  She was previously admitted and was receiving treatment when she left AMA 5 days ago.  She needs IV antibiotics for MRSA at the wound site.  She has a history of IV drug use and therefore not eligible for a PICC line.  The site looks very good.  It appears that the ACell is incorporating nicely.  It does not have malodor or purulent drainage.    Past Medical History:  Diagnosis Date  . Anxiety   . Chronic back pain   . Chronic knee pain   . COPD (chronic obstructive pulmonary disease) ([place])   . DDD (degenerative disc disease)   . Hepatitis C infection 07/13/2014  . IV drug abuse   . Nephrolithiasis   . Panic attacks   . Polysubstance abuse    cocaine, opiates, marijuana, amphetamines, "molly," crystal meth  . Pulmonary embolism [facility])     Past Surgical History:  Procedure Laterality Date  . APPLICATION OF A-CELL OF CHEST/ABDOMEN Right 09/07/2016   Procedure: APPLICATION OF A-CELL OF CHEST/ABDOMEN;  Surgeon: Wallace Going, DO;  Location: [place];  Service: Plastics;  Laterality: Right;  . CESAREAN SECTION    . I&D EXTREMITY Right 09/02/2016   Procedure: IRRIGATION AND DEBRIDEMENT OF RIGHT STERNOCLAVICULAR JOINT, STERNUM AND RIBS;  Surgeon: Gaye Pollack, MD;  Location: [place] OR;  Service: Vascular;  Laterality: Right;  . INCISION AND DRAINAGE OF WOUND Right 09/07/2016   Procedure: IRRIGATION AND DEBRIDEMENT OF CHEST WOUND, PLACEMENT OF A CELL AND VAC;  Surgeon: Wallace Going, DO;  Location: [place];  Service: Plastics;  Laterality: Right;  . NOSE SURGERY    . STERNAL WOUND DEBRIDEMENT N/A 02/22/2016   Procedure: IRRIGATION AND DEBRIDEMENT MANUBRIUM ABSCESS with wound cultures;  Surgeon: Melrose Nakayama, MD;  Location: [place];  Service: Thoracic;  Laterality:  N/A;  . STERNAL WOUND DEBRIDEMENT N/A 02/23/2016   Procedure: DRESSING CHANGE UNDER ANESTHESIA  WITH WOUND VAC PLACEMENT;  Surgeon: Melrose Nakayama, MD;  Location: [place];  Service: Thoracic;  Laterality: N/A;  . TEE WITHOUT CARDIOVERSION N/A 02/23/2016   Procedure: TRANSESOPHAGEAL ECHOCARDIOGRAM (TEE);  Surgeon: Jerline Pain, MD;  Location: [facility];  Service: Cardiovascular;  Laterality: N/A;  . TUBAL LIGATION     "tied, burned, clipped"    Family History  Problem Relation Age of Onset  . COPD Mother   . Hypertension Father   . Heart attack Father   . Diabetes Father   . Arthritis Unknown   . Lung disease Unknown   . Cancer Unknown   . Asthma Unknown     Social History:  reports that she has been smoking Cigarettes.  She has a 32.00 pack-year smoking history. She has never used smokeless tobacco. She reports that she uses drugs, including IV, Cocaine, Marijuana, Methamphetamines, Heroin, and Other-see comments. She reports that she does not drink alcohol.  Allergies:  Allergies  Allergen Reactions  . Mango Flavor Hives and Swelling  . Doxycycline Other (See Comments)    "BURNS" >> ? INCREASED SENSITIVITY TO SUNLIGHT ?  . Pyridium [Phenazopyridine Hcl] Other (See Comments)    Irritation to skin  . Tramadol Nausea And Vomiting    Medications: I have reviewed the patient's current medications.  Results for orders placed or performed during the hospital encounter of 09/24/16 (from the past 48 hour(s))  Basic metabolic panel     Status: Abnormal   Collection Time: 09/27/16  6:15 AM  Result Value Ref Range   Sodium 133 (L) 135 - 145 mmol/L   Potassium 3.5 3.5 - 5.1 mmol/L   Chloride 105 101 - 111 mmol/L   CO2 21 (L) 22 - 32 mmol/L   Glucose, Bld 113 (H) 65 - 99 mg/dL   BUN 11 6 - 20 mg/dL   Creatinine, Ser 0.97 0.44 - 1.00 mg/dL   Calcium 8.1 (L) 8.9 - 10.3 mg/dL   GFR calc non Af Amer >60 >60 mL/min   GFR calc Af Amer >60 >60 mL/min    Comment: (NOTE) The eGFR  has been calculated using the CKD EPI equation. This calculation has not been validated in all clinical situations. eGFR's persistently <60 mL/min signify possible Chronic Kidney Disease.    Anion gap 7 5 - 15  CBC     Status: Abnormal   Collection Time: 09/27/16  6:15 AM  Result Value Ref Range   WBC 10.5 4.0 - 10.5 K/uL   RBC 3.84 (L) 3.87 - 5.11 MIL/uL   Hemoglobin 9.8 (L) 12.0 - 15.0 g/dL   HCT 30.4 (L) 36.0 - 46.0 %   MCV 79.2 78.0 - 100.0 fL   MCH 25.5 (L) 26.0 - 34.0 pg   MCHC 32.2 30.0 - 36.0 g/dL   RDW 19.6 (H) 11.5 - 15.5 %   Platelets 266 150 - 400 K/uL    No results found.  Review of Systems  Constitutional: Negative.   HENT: Negative.   Eyes: Negative.   Respiratory: Negative.   Cardiovascular: Positive for chest pain.  Gastrointestinal: Negative.   Genitourinary: Negative.   Musculoskeletal: Negative.   Skin: Negative.   Neurological: Negative.   Psychiatric/Behavioral: Positive for depression and substance abuse.   Blood pressure 137/83, pulse (!) 41, temperature 97.8 F (36.6 C), resp. rate 18, height 5' 1" (1.549 m), weight 78.2 kg (172 lb 4.8 oz), SpO2 100 %. Physical Exam  Constitutional: She is oriented to person, place, and time. She appears well-developed and well-nourished.  HENT:  Head: Normocephalic.  Eyes: EOM are normal. Pupils are equal, round, and reactive to light.  Cardiovascular: Normal rate.   Respiratory: Effort normal.    GI: Soft. She exhibits no distension.  Neurological: She is alert and oriented to person, place, and time.  Skin: Skin is warm.  Psychiatric: She has a normal mood and affect. Her behavior is normal. Judgment and thought content normal.    Assessment/Plan: VAC placed today.  Please have nursing staff change the VAC twice a week and place [place] gel to the area.  May need more ACell but no surgery planned for the next 2 weeks as the Acell is incorporating.   S  09/28/2016, 12:59 PM     

## 2016-09-29 DIAGNOSIS — J869 Pyothorax without fistula: Secondary | ICD-10-CM

## 2016-09-29 NOTE — Progress Notes (Signed)
    [name] for Infectious Disease    Date of Admission:  09/24/2016     Reason for visit:  Follow up for right SCV joint septic arthritis/osteomyelitis   Subjective: Reports feeling nausea, "feverish", and not feeling well in general. She also reports feeling anxious and bored.   Medications:  . buprenorphine-naloxone  1 tablet Sublingual Daily  . [START ON 09/30/2016] cloNIDine  0.1 mg Oral QAC breakfast  . enoxaparin (LOVENOX) injection  40 mg Subcutaneous Q24H  . sodium chloride flush  3 mL Intravenous Q12H    Objective: Vital signs in last 24 hours: Temp:  [98.2 F (36.8 C)] 98.2 F (36.8 C) (06/20 0634) Pulse Rate:  [42-56] 42 (06/20 0634) Resp:  [18] 18 (06/20 0634) BP: (122-140)/(65-71) 139/65 (06/20 0834) SpO2:  [100 %] 100 % (06/20 0634)  General: Well nourished woman sitting up in bed, in NAD Skin: Right SCV joint with wound vac   Lab Results  Recent Labs  09/27/16 0615  WBC 10.5  HGB 9.8*  HCT 30.4*  NA 133*  K 3.5  CL 105  CO2 21*  BUN 11  CREATININE 0.97     Assessment/Plan:  Right SCV joint septic arthritis/osteomyelitis: Continue Vancomycin through July 24th to complete 8 weeks total. Continue wound vac for healing. May need to consider doing Linezolid for her as she is high risk for leaving AMA again. Encouraged her to ask her family to bring her things to do (books, magazines, word puzzles, etc) to keep her occupied.   Opioid dependence: Continue Suboxone. Suspect some of her symptoms (nausea, anxiousness) are related to withdrawal.   Attending note to follow  Albin Felling, MD, MPH Internal Medicine Resident, PGY-III Pager: [phone] 09/29/2016, 2:11 PM

## 2016-09-29 NOTE — Discharge Summary (Signed)
Patient was seen and evaluated earlier that day. She was requesting oral antibiotics because she wanted to leave. I indicated that she required IV antibiotics to treat her properly and that continued hospitalization was required. Patient decided to leave against medical advice.  [name], [employer]

## 2016-09-29 NOTE — Progress Notes (Signed)
PROGRESS NOTE    [name]  EVO:[id number] DOB: [birth year] DOA: 09/24/2016 PCP: Patient, [name]   Brief Narrative: [name] is a 46 y.o. female with a history of IV drug use, hepatitis C, septic arthritis, endocarditis, sternoclavicular abscess. She presents after leaving AMA for treatment of her abscess.  Assessment & Plan:   Principal Problem:   Abscess of chest ([place]) Active Problems:   Polysubstance abuse   IV drug abuse   COPD (chronic obstructive pulmonary disease) (HCC)   Pulmonary embolism, septic (HCC)   Septic arthritis of right sternoclavicular joint (H[facility])   Endocarditis of tricuspid valve   Wound infection   Abscess of chest Septic arthritis of right sternoclavicular joint Previous plans to treat with vancomycin for a total of 8 weeks. Given history of patient leaving AMA she is not candidate for SNF placement -continue vancomycin. After reviewing ID note if patient threatens to leave we have been recommended to discharge patient on linezolid -ID on board -wound care: plastic surgery consulted and managing wound VAC  IV drug abuse Current user. Counseled. History of cocaine and heroin use -suboxone  COPD Stable. No wheezes  History of endocarditis Last TEE on 09/02/2016 significant for no vegetations.   DVT prophylaxis: Lovenox Code Status: Full code Family Communication: None at bedside Disposition Plan: Discharge pending full antibiotic course completion   Consultants:   Infectious disease  Procedures:   None  Antimicrobials:  Vancomycin    Subjective: The patient has no new complaints today. She is frustrated she has sustained a hospital for treatment of her infection.  Objective: Vitals:   09/28/16 2245 09/29/16 0634 09/29/16 0834 09/29/16 1527  BP:  122/66 139/65 108/65  Pulse: (!) 56 (!) 42  63  Resp:  18  19  Temp:  98.2 F (36.8 C)  98.4 F (36.9 C)  TempSrc:      SpO2:  100%  100%  Weight:      Height:         Intake/Output Summary (Last 24 hours) at 09/29/16 1858 Last data filed at 09/29/16 1300  Gross per 24 hour  Intake              720 ml  Output                0 ml  Net              720 ml   Filed Weights   09/24/16 2316 09/25/16 0957  Weight: 74.4 kg (164 lb) 78.2 kg (172 lb 4.8 oz)    Examination:  General exam: Appears calm and comfortable, in nad. Respiratory system: Respiratory effort normal. Equal chest rise, no wheezes Central nervous system: Alert and oriented. No facial asymmetry Extremities: No edema. No calf tenderness Skin: Right upper chest wound dressing is clean and intact. Warm and dry otherwise. Psychiatry:  Mood & affect appropriate.    Data Reviewed: I have personally reviewed following labs and imaging studies  CBC:  Recent Labs Lab 09/25/16 0024 09/25/16 1121 09/27/16 0615  WBC 9.4 8.2 10.5  NEUTROABS 4.4  --   --   HGB 9.4* 9.3* 9.8*  HCT 28.4* 28.9* 30.4*  MCV 76.8* 78.3 79.2  PLT 240 256 299   Basic Metabolic Panel:  Recent Labs Lab 09/25/16 0024 09/25/16 1121 09/27/16 0615  NA 135 133* 133*  K 3.3* 3.6 3.5  CL 103 105 105  CO2 26 22 21*  GLUCOSE 117* 122* 113*  BUN 15 8 11   CREATININE 0.87 0.74 0.97  CALCIUM 8.3* 8.4* 8.1*   GFR: Estimated Creatinine Clearance: 70.1 mL/min (by C-G formula based on SCr of 0.97 mg/dL). Liver Function Tests:  Recent Labs Lab 09/25/16 0024  AST 21  ALT 19  ALKPHOS 61  BILITOT 0.3  PROT 7.5  ALBUMIN 3.2*    Recent Results (from the past 240 hour(s))  Culture, blood (routine x 2)     Status: None (Preliminary result)   Collection Time: 09/25/16 12:24 AM  Result Value Ref Range Status   Specimen Description BLOOD LEFT ARM  Final   Special Requests   Final    BOTTLES DRAWN AEROBIC AND ANAEROBIC Blood Culture results may not be optimal due to an inadequate volume of blood received in culture bottles   Culture NO GROWTH 4 DAYS  Final   Report Status PENDING  Incomplete  Culture, blood  (routine x 2)     Status: None (Preliminary result)   Collection Time: 09/25/16 12:32 AM  Result Value Ref Range Status   Specimen Description BLOOD RIGHT ARM  Final   Special Requests   Final    BOTTLES DRAWN AEROBIC AND ANAEROBIC Blood Culture adequate volume   Culture NO GROWTH 4 DAYS  Final   Report Status PENDING  Incomplete     Radiology Studies: No results found.  Scheduled Meds: . buprenorphine-naloxone  1 tablet Sublingual Daily  . [START ON 09/30/2016] cloNIDine  0.1 mg Oral QAC breakfast  . enoxaparin (LOVENOX) injection  40 mg Subcutaneous Q24H  . sodium chloride flush  3 mL Intravenous Q12H   Continuous Infusions: . sodium chloride    . vancomycin 750 mg (09/29/16 1748)     LOS: 4 days   Velvet Bathe, MD [facility] 09/29/2016, 6:58 PM Pager: [phone]  If 7PM-7AM, please contact night-coverage [email or web address] Password [facility] 09/29/2016, 6:58 PM

## 2016-09-30 LAB — FUNGUS CULTURE WITH STAIN

## 2016-09-30 LAB — CULTURE, BLOOD (ROUTINE X 2)
Culture: NO GROWTH
Culture: NO GROWTH
Special Requests: ADEQUATE

## 2016-09-30 LAB — FUNGUS CULTURE RESULT

## 2016-09-30 LAB — FUNGAL ORGANISM REFLEX

## 2016-09-30 NOTE — Progress Notes (Signed)
Patient with no IV access. MD made aware and he encouraged the patient to drink more fluids and IV team to come later to try again to find a peripheral IV. No order for PICC line at this time. Will continue to monitor.

## 2016-09-30 NOTE — Progress Notes (Signed)
[name]  YNW:[id number] DOB: 1970-06-28 DOA: 09/24/2016 PCP: Patient, [name]   Brief Narrative: [name] is a 47 y.o. female with a history of IV drug use, hepatitis C, septic arthritis, endocarditis, sternoclavicular abscess. She presents after leaving AMA for treatment of her abscess. She continues IV antibiotics at this moment.  Assessment & Plan:   Principal Problem:   Abscess of chest ([place]) Active Problems:   Polysubstance abuse   IV drug abuse   COPD (chronic obstructive pulmonary disease) ([facility])   Pulmonary embolism, septic (H[facility])   Septic arthritis of right sternoclavicular joint (H[facility])   Endocarditis of tricuspid valve   Wound infection   Abscess of chest Septic arthritis of right sternoclavicular joint Previous plans to treat with vancomycin for a total of 8 weeks. Given history of patient leaving AMA she is not candidate for SNF placement -continue vancomycin. ID recommending to discharge patient on linezolid if she threatens to leave [facility] -Plastic surgery on board and managing wound VAC - The patient had loss of IV access. I am hesitant to have PICC line placed as patient has left AMA and has addiction to heroin. The concern is that patient may lobe with PICC line in place and use of a source for IV drug administration. At this point will have nursing encourage fluid intake and have IV team try again tonight. Antibiotics not due until 11 tonight.  IV drug abuse Current user. Counseled. History of cocaine and heroin use -Patient is currently on suboxone - Please see discussion above  COPD Stable. No increased work of breathing  History of endocarditis Last TEE on 09/02/2016 significant for no vegetations.   DVT prophylaxis: Lovenox Code Status: Full code Family Communication: None at bedside, updated patient directly Disposition Plan: Discharge pending full antibiotic course completion   Consultants:   Infectious  disease  Procedures:   None  Antimicrobials:  Vancomycin    Subjective: The patient reports no new problems.  Objective: Vitals:   09/29/16 1527 09/29/16 2226 09/30/16 0556 09/30/16 1508  BP: 108/65 113/86 122/63 (!) 111/57  Pulse: 63 (!) 54 (!) 53 68  Resp: 19 17 17 18   Temp: 98.4 F (36.9 C) 98.3 F (36.8 C) 98.1 F (36.7 C) 98.3 F (36.8 C)  TempSrc:  Oral Oral Oral  SpO2: 100% 100% 99% 96%  Weight:      Height:        Intake/Output Summary (Last 24 hours) at 09/30/16 1748 Last data filed at 09/30/16 1425  Gross per 24 hour  Intake             1340 ml  Output                0 ml  Net             1340 ml   Filed Weights   09/24/16 2316 09/25/16 0957  Weight: 74.4 kg (164 lb) 78.2 kg (172 lb 4.8 oz)    Examination:  General exam:  in nad, Appears calm and comfortable, Respiratory system:  Equal chest rise, no wheezes, Respiratory effort normal. Central nervous system: Alert and oriented. No facial asymmetry, answers questions appropriately Extremities: No edema. No calf tenderness Skin: Right upper chest wound dressing is clean and intact with wound VAC in place. Warm and dry otherwise. Psychiatry:  Mood & affect appropriate.    Data Reviewed: I have personally reviewed following labs and imaging studies  CBC:  Recent  Labs Lab 09/25/16 0024 09/25/16 1121 09/27/16 0615  WBC 9.4 8.2 10.5  NEUTROABS 4.4  --   --   HGB 9.4* 9.3* 9.8*  HCT 28.4* 28.9* 30.4*  MCV 76.8* 78.3 79.2  PLT 240 256 244   Basic Metabolic Panel:  Recent Labs Lab 09/25/16 0024 09/25/16 1121 09/27/16 0615  NA 135 133* 133*  K 3.3* 3.6 3.5  CL 103 105 105  CO2 26 22 21*  GLUCOSE 117* 122* 113*  BUN 15 8 11   CREATININE 0.87 0.74 0.97  CALCIUM 8.3* 8.4* 8.1*   GFR: Estimated Creatinine Clearance: 70.1 mL/min (by C-G formula based on SCr of 0.97 mg/dL). Liver Function Tests:  Recent Labs Lab 09/25/16 0024  AST 21  ALT 19  ALKPHOS 61  BILITOT 0.3  PROT 7.5   ALBUMIN 3.2*    Recent Results (from the past 240 hour(s))  Culture, blood (routine x 2)     Status: None   Collection Time: 09/25/16 12:24 AM  Result Value Ref Range Status   Specimen Description BLOOD LEFT ARM  Final   Special Requests   Final    BOTTLES DRAWN AEROBIC AND ANAEROBIC Blood Culture results may not be optimal due to an inadequate volume of blood received in culture bottles   Culture NO GROWTH 5 DAYS  Final   Report Status 09/30/2016 FINAL  Final  Culture, blood (routine x 2)     Status: None   Collection Time: 09/25/16 12:32 AM  Result Value Ref Range Status   Specimen Description BLOOD RIGHT ARM  Final   Special Requests   Final    BOTTLES DRAWN AEROBIC AND ANAEROBIC Blood Culture adequate volume   Culture NO GROWTH 5 DAYS  Final   Report Status 09/30/2016 FINAL  Final     Radiology Studies: No results found.  Scheduled Meds: . buprenorphine-naloxone  1 tablet Sublingual Daily  . cloNIDine  0.1 mg Oral QAC breakfast  . enoxaparin (LOVENOX) injection  40 mg Subcutaneous Q24H  . sodium chloride flush  3 mL Intravenous Q12H   Continuous Infusions: . sodium chloride    . vancomycin Stopped (09/30/16 1525)     LOS: 5 days   Velvet Bathe, MD [facility] 09/30/2016, 5:48 PM Pager: [phone]  If 7PM-7AM, please contact night-coverage [email or web address] Password [facility] 09/30/2016, 5:48 PM

## 2016-10-01 LAB — VANCOMYCIN, TROUGH: Vancomycin Tr: 18 ug/mL (ref 15–20)

## 2016-10-01 LAB — CBC WITH DIFFERENTIAL/PLATELET
Basophils Absolute: 0.1 10*3/uL (ref 0.0–0.1)
Basophils Relative: 1 %
Eosinophils Absolute: 0.2 10*3/uL (ref 0.0–0.7)
Eosinophils Relative: 2 %
HCT: 33.1 % — ABNORMAL LOW (ref 36.0–46.0)
Hemoglobin: 10.5 g/dL — ABNORMAL LOW (ref 12.0–15.0)
Lymphocytes Relative: 36 %
Lymphs Abs: 3.6 10*3/uL (ref 0.7–4.0)
MCH: 24.5 pg — ABNORMAL LOW (ref 26.0–34.0)
MCHC: 31.7 g/dL (ref 30.0–36.0)
MCV: 77.2 fL — ABNORMAL LOW (ref 78.0–100.0)
Monocytes Absolute: 0.5 10*3/uL (ref 0.1–1.0)
Monocytes Relative: 5 %
Neutro Abs: 5.6 10*3/uL (ref 1.7–7.7)
Neutrophils Relative %: 56 %
Platelets: 326 10*3/uL (ref 150–400)
RBC: 4.29 MIL/uL (ref 3.87–5.11)
RDW: 18.6 % — ABNORMAL HIGH (ref 11.5–15.5)
WBC: 10 10*3/uL (ref 4.0–10.5)

## 2016-10-01 LAB — COMPREHENSIVE METABOLIC PANEL
ALT: 22 U/L (ref 14–54)
AST: 23 U/L (ref 15–41)
Albumin: 3.3 g/dL — ABNORMAL LOW (ref 3.5–5.0)
Alkaline Phosphatase: 67 U/L (ref 38–126)
Anion gap: 6 (ref 5–15)
BUN: 11 mg/dL (ref 6–20)
CO2: 26 mmol/L (ref 22–32)
Calcium: 8.6 mg/dL — ABNORMAL LOW (ref 8.9–10.3)
Chloride: 103 mmol/L (ref 101–111)
Creatinine, Ser: 0.91 mg/dL (ref 0.44–1.00)
GFR calc Af Amer: >60 mL/min (ref >60)
GFR calc non Af Amer: >60 mL/min (ref >60)
Glucose, Bld: 86 mg/dL (ref 65–99)
Potassium: 3.6 mmol/L (ref 3.5–5.1)
Sodium: 135 mmol/L (ref 135–145)
Total Bilirubin: 0.5 mg/dL (ref 0.3–1.2)
Total Protein: 7.9 g/dL (ref 6.5–8.1)

## 2016-10-01 LAB — BASIC METABOLIC PANEL
Anion gap: 7 (ref 5–15)
BUN: 12 mg/dL (ref 6–20)
CO2: 24 mmol/L (ref 22–32)
Calcium: 8.4 mg/dL — ABNORMAL LOW (ref 8.9–10.3)
Chloride: 104 mmol/L (ref 101–111)
Creatinine, Ser: 0.8 mg/dL (ref 0.44–1.00)
GFR calc Af Amer: >60 mL/min (ref >60)
GFR calc non Af Amer: >60 mL/min (ref >60)
Glucose, Bld: 99 mg/dL (ref 65–99)
Potassium: 4.1 mmol/L (ref 3.5–5.1)
Sodium: 135 mmol/L (ref 135–145)

## 2016-10-01 LAB — CK: Total CK: 33 U/L — ABNORMAL LOW (ref 38–234)

## 2016-10-01 MED ORDER — HYDROXYZINE HCL 25 MG PO TABS
25.0000 mg | ORAL_TABLET | ORAL | Status: DC | PRN
  Administered 2016-10-01 – 2016-10-15 (×13): 50 mg via ORAL
  Filled 2016-10-01: qty 2
  Filled 2016-10-01: qty 1
  Filled 2016-10-01 (×7): qty 2
  Filled 2016-10-01: qty 1
  Filled 2016-10-01 (×3): qty 2
  Filled 2016-10-01 (×2): qty 1

## 2016-10-01 NOTE — Progress Notes (Signed)
Wound vac dressing was changed. Treatment started continuous at 125 mmHg. Patient tolerated well. Will continue to monitor.

## 2016-10-01 NOTE — Progress Notes (Signed)
    [place] for Infectious Disease    Date of Admission:  09/24/2016   Total days of antibiotics 27        Day 27 vanco   ID: [name] is a 46 y.o. female with MRSA right [place] septic arthritis osteo Principal Problem:   Abscess of chest ([place]) Active Problems:   Polysubstance abuse   IV drug abuse   COPD (chronic obstructive pulmonary disease) ([facility])   Pulmonary embolism, septic ([facility])   Septic arthritis of right sternoclavicular joint ([facility])   Endocarditis of tricuspid valve   Wound infection    Subjective: Having myalgias waist and up otherwise afebrile. Underwent wound vac change today.    Medications:  . buprenorphine-naloxone  1 tablet Sublingual Daily  . enoxaparin (LOVENOX) injection  40 mg Subcutaneous Q24H  . sodium chloride flush  3 mL Intravenous Q12H    Objective: Vital signs in last 24 hours: Temp:  [98 F (36.7 C)-98.4 F (36.9 C)] 98 F (36.7 C) (06/22 0441) Pulse Rate:  [63-73] 65 (06/22 0739) Resp:  [18] 18 (06/22 0441) BP: (95-116)/(49-69) 116/69 (06/22 0739) SpO2:  [96 %-100 %] 97 % (06/22 0441)  Physical Exam  Constitutional:  oriented to person, place, and time. appears well-developed and well-nourished. No distress. Smells of smoke HENT: [place]/AT, PERRLA, no scleral icterus Mouth/Throat: Oropharynx is clear and moist. No oropharyngeal exudate.  Cardiovascular: Normal rate, regular rhythm and normal heart sounds. Exam reveals no gallop and no friction rub.  No murmur heard.  Pulmonary/Chest: Effort normal and breath sounds normal. No respiratory distress.  has no wheezes.  Neck = supple, no nuchal rigidity Chest wall = wound vac in place no surrounding erythema Psychiatric: a normal mood and affect.  behavior is normal.   Lab Results  Recent Labs  10/01/16 0653  NA 135  K 4.1  CL 104  CO2 24  BUN 12  CREATININE 0.80   Lab Results  Component Value Date   ESRSEDRATE 76 (H) 10/01/2014   No results found for:  CRP  Microbiology: 5/24 wound MRSA Studies/Results: No results found.   Assessment/Plan: MRSA [place] joint septic arthritis = currently on day 27 of IV vanco. Patient is not a candidate for SNF. 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

## 2016-10-01 NOTE — Progress Notes (Signed)
Pharmacy Antibiotic Note  [name] is a 46 y.o. female admitted on 09/24/2016 with sternoclavicular wound.  Pharmacy was consulted for vancomycin dosing. 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 A vancomycin trough checked today is therapeutic at 18.   ID plans- last day of vancomycin therapy extended through 7/24 per ID note 6/18  Plan: Continue Vancomycin 750mg  IV q8h F/u renal fxn, C&S, clinical status and trough at SS  Height: 5\' 1"  (154.9 cm) Weight: 172 lb 4.8 oz (78.2 kg) IBW/kg (Calculated) : 47.8  Temp (24hrs), Avg:98.2 F (36.8 C), Min:98 F (36.7 C), Max:98.4 F (36.9 C)   Recent Labs Lab 09/25/16 0024 09/25/16 1121 09/27/16 0615 09/28/16 1245 10/01/16 0653  WBC 9.4 8.2 10.5  --   --   CREATININE 0.87 0.74 0.97  --  0.80  VANCOTROUGH  --   --   --  22* 18    Estimated Creatinine Clearance: 85 mL/min (by C-G formula based on SCr of 0.8 mg/dL).    Allergies  Allergen Reactions  . Mango Flavor Hives and Swelling  . Doxycycline Other (See Comments)    "BURNS" >> ? INCREASED SENSITIVITY TO SUNLIGHT ?  . Pyridium [Phenazopyridine Hcl] Other (See Comments)    Irritation to skin  . Tramadol Nausea And Vomiting    Antimicrobials this admission: 5/22 vanc >> (7/24) 5/22 cefepime >> 5/30  Dose adjustments this admission: 5/26 VT: 12 on 1000mg  IV q12 5/30 VT 15 on 1250mg  IV q12 6/5 VT 12 (dose given late)- inc 1250 Q8 for calc'd VT ~18.  6/7 VT 22 on 1250mg  q8 >> decr 1g Q8 6/19 VT = 22 on 1g q8>>decr to 750mg  q8h 6/22 VT = 18 on 750 Q8H  Microbiology results: 5/22 MRSA PCR: neg 5/22 BCx: ngF 5/24 wound abscess: few MRSA, rare CoNS 5/24 fungal cx: ip 6/16 BCx: ngtd  Thank you for allowing pharmacy to be a part of this patient's care.  Jesiah Yerby, Rande Lawman 10/01/2016 8:07  AM

## 2016-10-01 NOTE — Progress Notes (Signed)
PROGRESS NOTE    [name]  [id number]:[id number] DOB: 1971/02/03 DOA: 09/24/2016 PCP: Patient, [name]   Brief Narrative: [name] is a 46 y.o. female with a history of IV drug use, hepatitis C, septic arthritis, endocarditis, sternoclavicular abscess. She presents after leaving AMA for treatment of her abscess. She continues IV antibiotics at this moment.  Assessment & Plan:   Principal Problem:   Abscess of chest ([place]) Active Problems:   Polysubstance abuse   IV drug abuse   COPD (chronic obstructive pulmonary disease) (HCC)   Pulmonary embolism, septic (HCC)   Septic arthritis of right sternoclavicular joint (HCC)   Endocarditis of tricuspid valve   Wound infection   Abscess of chest Septic arthritis of right sternoclavicular joint Previous plans to treat with vancomycin for a total of 8 weeks. Given history of patient leaving AMA she is not candidate for SNF placement -continue vancomycin. ID recommending to discharge patient on linezolid if she threatens to leave [facility] -Plastic surgery on board and managing wound VAC - Plan stays same.  IV drug abuse Current user. We'll plan on continuing counseling History of cocaine and heroin use -Patient is currently on suboxone  COPD Stable. No wheezes  History of endocarditis Last TEE on 09/02/2016 significant for no vegetations.   DVT prophylaxis: Lovenox Code Status: Full code Family Communication: None at bedside, updated patient directly Disposition Plan: Discharge pending full antibiotic course completion   Consultants:   Infectious disease  Procedures:   None  Antimicrobials:  Vancomycin    Subjective: No new complaints reported  Objective: Vitals:   09/30/16 2213 10/01/16 0441 10/01/16 0739 10/01/16 1432  BP: 111/65 (!) 95/49 116/69 (!) 106/49  Pulse: 63 73 65 71  Resp: 18 18  18   Temp: 98.4 F (36.9 C) 98 F (36.7 C)  98.6 F (37 C)  TempSrc: Oral Oral  Oral  SpO2: 100% 97%  100%    Weight:      Height:        Intake/Output Summary (Last 24 hours) at 10/01/16 1840 Last data filed at 10/01/16 1448  Gross per 24 hour  Intake              570 ml  Output                0 ml  Net              570 ml   Filed Weights   09/24/16 2316 09/25/16 0957  Weight: 74.4 kg (164 lb) 78.2 kg (172 lb 4.8 oz)    Examination:Physical exam is unchanged when compared to yesterday's exam on 09/30/2016  General exam:  in nad, Appears calm and comfortable, Respiratory system:  Equal chest rise, no wheezes, Respiratory effort normal. Central nervous system: Alert and oriented. No facial asymmetry, answers questions appropriately Extremities: No edema. No calf tenderness Skin: Right upper chest wound dressing is clean and intact with wound VAC in place. Warm and dry otherwise. Psychiatry:  Mood & affect appropriate.    Data Reviewed: I have personally reviewed following labs and imaging studies  CBC:  Recent Labs Lab 09/25/16 0024 09/25/16 1121 09/27/16 0615 10/01/16 1343  WBC 9.4 8.2 10.5 10.0  NEUTROABS 4.4  --   --  5.6  HGB 9.4* 9.3* 9.8* 10.5*  HCT 28.4* 28.9* 30.4* 33.1*  MCV 76.8* 78.3 79.2 77.2*  PLT 240 256 266 237   Basic Metabolic Panel:  Recent Labs Lab 09/25/16 0024 09/25/16  1121 09/27/16 0615 10/01/16 0653 10/01/16 1343  NA 135 133* 133* 135 135  K 3.3* 3.6 3.5 4.1 3.6  CL 103 105 105 104 103  CO2 26 22 21* 24 26  GLUCOSE 117* 122* 113* 99 86  BUN 15 8 11 12 11   CREATININE 0.87 0.74 0.97 0.80 0.91  CALCIUM 8.3* 8.4* 8.1* 8.4* 8.6*   GFR: Estimated Creatinine Clearance: 74.7 mL/min (by C-G formula based on SCr of 0.91 mg/dL). Liver Function Tests:  Recent Labs Lab 09/25/16 0024 10/01/16 1343  AST 21 23  ALT 19 22  ALKPHOS 61 67  BILITOT 0.3 0.5  PROT 7.5 7.9  ALBUMIN 3.2* 3.3*    Recent Results (from the past 240 hour(s))  Culture, blood (routine x 2)     Status: None   Collection Time: 09/25/16 12:24 AM  Result Value Ref Range  Status   Specimen Description BLOOD LEFT ARM  Final   Special Requests   Final    BOTTLES DRAWN AEROBIC AND ANAEROBIC Blood Culture results may not be optimal due to an inadequate volume of blood received in culture bottles   Culture NO GROWTH 5 DAYS  Final   Report Status 09/30/2016 FINAL  Final  Culture, blood (routine x 2)     Status: None   Collection Time: 09/25/16 12:32 AM  Result Value Ref Range Status   Specimen Description BLOOD RIGHT ARM  Final   Special Requests   Final    BOTTLES DRAWN AEROBIC AND ANAEROBIC Blood Culture adequate volume   Culture NO GROWTH 5 DAYS  Final   Report Status 09/30/2016 FINAL  Final     Radiology Studies: No results found.  Scheduled Meds: . buprenorphine-naloxone  1 tablet Sublingual Daily  . enoxaparin (LOVENOX) injection  40 mg Subcutaneous Q24H  . sodium chloride flush  3 mL Intravenous Q12H   Continuous Infusions: . sodium chloride    . vancomycin Stopped (10/01/16 1548)     LOS: 6 days   Velvet Bathe, MD [facility] 10/01/2016, 6:40 PM Pager: [phone]  If 7PM-7AM, please contact night-coverage [email or web address] Password [facility] 10/01/2016, 6:40 PM

## 2016-10-02 LAB — CREATININE, SERUM
Creatinine, Ser: 0.95 mg/dL (ref 0.44–1.00)
GFR calc Af Amer: >60 mL/min (ref >60)
GFR calc non Af Amer: >60 mL/min (ref >60)

## 2016-10-02 NOTE — Progress Notes (Addendum)
PROGRESS NOTE    [name]  [id number] DOB: Jul 16, 1970 DOA: 09/24/2016 PCP: Patient, [name]   Brief Narrative: [name] is a 46 y.o. female with a history of IV drug use, hepatitis C, septic arthritis, endocarditis, sternoclavicular abscess. She presents after leaving AMA for treatment of her abscess. She continues IV antibiotics at this moment.  Assessment & Plan:   Principal Problem:   Abscess of chest ([place]) Active Problems:   Polysubstance abuse   IV drug abuse   COPD (chronic obstructive pulmonary disease) (HCC)   Pulmonary embolism, septic (HCC)   Septic arthritis of right sternoclavicular joint (HCC)   Endocarditis of tricuspid valve   Wound infection   Abscess of chest Septic arthritis of right sternoclavicular joint Previous plans to treat with vancomycin for a total of 8 weeks. Given history of patient leaving AMA she is not candidate for SNF placement -continue vancomycin. ID recommending to discharge patient on linezolid if she threatens to leave [facility] -Plastic surgery on board and managing wound VAC - Continue plan outlined by ID  IV drug abuse Current user. We'll plan on continuing counseling History of cocaine and heroin use -Patient is currently on suboxone  COPD Stable. No wheezes  History of endocarditis Last TEE on 09/02/2016 significant for no vegetations.   DVT prophylaxis: Lovenox Code Status: Full code Family Communication: None at bedside, updated patient directly Disposition Plan: Discharge pending full antibiotic course completion   Consultants:   Infectious disease  Procedures:   None  Antimicrobials:  Vancomycin    Subjective: No new complaints reported  Objective: Vitals:   10/01/16 0739 10/01/16 1432 10/01/16 2236 10/02/16 0452  BP: 116/69 (!) 106/49 (!) 96/45 109/65  Pulse: 65 71 66 (!) 57  Resp:  18 18 18   Temp:  98.6 F (37 C) 98.9 F (37.2 C) 98.5 F (36.9 C)  TempSrc:  Oral  Oral  SpO2:  100%  100% 99%  Weight:      Height:        Intake/Output Summary (Last 24 hours) at 10/02/16 1218 Last data filed at 10/02/16 1118  Gross per 24 hour  Intake             1110 ml  Output                0 ml  Net             1110 ml   Filed Weights   09/24/16 2316 09/25/16 0957  Weight: 74.4 kg (164 lb) 78.2 kg (172 lb 4.8 oz)    Examination:Physical exam is unchanged when compared to yesterday's exam on 10/01/2016  General exam:  Appears calm and comfortable, in nad Respiratory system:  Equal chest rise, no wheezes, Respiratory effort normal. No wheezes on exam. Central nervous system: Alert and oriented. No facial asymmetry, answers questions appropriately Extremities: No edema. No calf tenderness Skin: Right upper chest wound dressing is clean and intact with wound VAC in place. Warm and dry otherwise. Psychiatry:  Mood & affect appropriate.    Data Reviewed: I have personally reviewed following labs and imaging studies  CBC:  Recent Labs Lab 09/27/16 0615 10/01/16 1343  WBC 10.5 10.0  NEUTROABS  --  5.6  HGB 9.8* 10.5*  HCT 30.4* 33.1*  MCV 79.2 77.2*  PLT 266 440   Basic Metabolic Panel:  Recent Labs Lab 09/27/16 0615 10/01/16 0653 10/01/16 1343 10/02/16 0438  NA 133* 135 135  --  K 3.5 4.1 3.6  --   CL 105 104 103  --   CO2 21* 24 26  --   GLUCOSE 113* 99 86  --   BUN 11 12 11   --   CREATININE 0.97 0.80 0.91 0.95  CALCIUM 8.1* 8.4* 8.6*  --    GFR: Estimated Creatinine Clearance: 71.6 mL/min (by C-G formula based on SCr of 0.95 mg/dL). Liver Function Tests:  Recent Labs Lab 10/01/16 1343  AST 23  ALT 22  ALKPHOS 67  BILITOT 0.5  PROT 7.9  ALBUMIN 3.3*    Recent Results (from the past 240 hour(s))  Culture, blood (routine x 2)     Status: None   Collection Time: 09/25/16 12:24 AM  Result Value Ref Range Status   Specimen Description BLOOD LEFT ARM  Final   Special Requests   Final    BOTTLES DRAWN AEROBIC AND ANAEROBIC Blood Culture  results may not be optimal due to an inadequate volume of blood received in culture bottles   Culture NO GROWTH 5 DAYS  Final   Report Status 09/30/2016 FINAL  Final  Culture, blood (routine x 2)     Status: None   Collection Time: 09/25/16 12:32 AM  Result Value Ref Range Status   Specimen Description BLOOD RIGHT ARM  Final   Special Requests   Final    BOTTLES DRAWN AEROBIC AND ANAEROBIC Blood Culture adequate volume   Culture NO GROWTH 5 DAYS  Final   Report Status 09/30/2016 FINAL  Final     Radiology Studies: No results found.  Scheduled Meds: . buprenorphine-naloxone  1 tablet Sublingual Daily  . enoxaparin (LOVENOX) injection  40 mg Subcutaneous Q24H  . sodium chloride flush  3 mL Intravenous Q12H   Continuous Infusions: . sodium chloride    . vancomycin Stopped (10/02/16 0714)     LOS: 7 days   Velvet Bathe, MD [facility] 10/02/2016, 12:18 PM Pager: [phone]  If 7PM-7AM, please contact night-coverage [email or web address] Password [facility] 10/02/2016, 12:18 PM

## 2016-10-03 MED ORDER — WITCH HAZEL-GLYCERIN EX PADS
MEDICATED_PAD | CUTANEOUS | Status: DC | PRN
  Filled 2016-10-03: qty 100

## 2016-10-03 NOTE — Progress Notes (Signed)
[name]  [id number]:[id number] DOB: Aug 19, 1970 DOA: 09/24/2016 PCP: Patient, [name]   Brief Narrative: [name] is a 46 y.o. female with a history of IV drug use, hepatitis C, septic arthritis, endocarditis, sternoclavicular abscess. She presents after leaving AMA for treatment of her abscess. She continues IV antibiotics at this moment.  Assessment & Plan:   Principal Problem:   Abscess of chest ([place]) Active Problems:   Polysubstance abuse   IV drug abuse   COPD (chronic obstructive pulmonary disease) (HCC)   Pulmonary embolism, septic (HCC)   Septic arthritis of right sternoclavicular joint (HCC)   Endocarditis of tricuspid valve   Wound infection   Abscess of chest Septic arthritis of right sternoclavicular joint Previous plans to treat with vancomycin for a total of 8 weeks. Given history of patient leaving AMA she is not candidate for SNF placement -continue vancomycin. ID recommending to discharge patient on linezolid if she threatens to leave AMA - Plastic surgery on board and managing wound VAC -Plan is to continue antibiotic plan developed by infectious disease  IV drug abuse Current user. Patient has been counseled on abstinence from cocaine and heroin use -Patient is currently on suboxone  COPD Stable. No wheezes  History of endocarditis Last TEE on 09/02/2016 significant for no vegetations.   DVT prophylaxis: Lovenox Code Status: Full code Family Communication: None at bedside, updated patient directly Disposition Plan: Discharge pending full antibiotic course completion   Consultants:   Infectious disease  Procedures:   None  Antimicrobials:  Vancomycin    Subjective: She reports no new complaints  Objective: Vitals:   10/02/16 1345 10/02/16 2202 10/03/16 0558 10/03/16 1406  BP: 102/63 116/66 102/62 118/67  Pulse: 69 69 (!) 59 66  Resp: 18 18 18 17   Temp: 98.6 F (37 C) 97.9 F (36.6 C) 98.7 F (37.1 C) 98 F  (36.7 C)  TempSrc: Oral   Oral  SpO2: 100% 100% 100% 100%  Weight:      Height:        Intake/Output Summary (Last 24 hours) at 10/03/16 1713 Last data filed at 10/03/16 1525  Gross per 24 hour  Intake           1971.5 ml  Output                0 ml  Net           1971.5 ml   Filed Weights   09/24/16 2316 09/25/16 0957  Weight: 74.4 kg (164 lb) 78.2 kg (172 lb 4.8 oz)    Examination:Physical exam is unchanged when compared to yesterday's exam on 10/02/2016  General exam:  Appears calm and comfortable, in nad Respiratory system:  Equal chest rise, no wheezes, Respiratory effort normal. No wheezes on exam. Central nervous system: Alert and oriented. No facial asymmetry, answers questions appropriately Extremities: No edema. No calf tenderness Skin: Right upper chest wound dressing is clean and intact with wound VAC in place. Warm and dry otherwise. Psychiatry:  Mood & affect appropriate.    Data Reviewed: I have personally reviewed following labs and imaging studies  CBC:  Recent Labs Lab 09/27/16 0615 10/01/16 1343  WBC 10.5 10.0  NEUTROABS  --  5.6  HGB 9.8* 10.5*  HCT 30.4* 33.1*  MCV 79.2 77.2*  PLT 266 322   Basic Metabolic Panel:  Recent Labs Lab 09/27/16 0615 10/01/16 0653 10/01/16 1343 10/02/16 0438  NA 133* 135 135  --  K 3.5 4.1 3.6  --   CL 105 104 103  --   CO2 21* 24 26  --   GLUCOSE 113* 99 86  --   BUN 11 12 11   --   CREATININE 0.97 0.80 0.91 0.95  CALCIUM 8.1* 8.4* 8.6*  --    GFR: Estimated Creatinine Clearance: 71.6 mL/min (by C-G formula based on SCr of 0.95 mg/dL). Liver Function Tests:  Recent Labs Lab 10/01/16 1343  AST 23  ALT 22  ALKPHOS 67  BILITOT 0.5  PROT 7.9  ALBUMIN 3.3*    Recent Results (from the past 240 hour(s))  Culture, blood (routine x 2)     Status: None   Collection Time: 09/25/16 12:24 AM  Result Value Ref Range Status   Specimen Description BLOOD LEFT ARM  Final   Special Requests   Final     BOTTLES DRAWN AEROBIC AND ANAEROBIC Blood Culture results may not be optimal due to an inadequate volume of blood received in culture bottles   Culture NO GROWTH 5 DAYS  Final   Report Status 09/30/2016 FINAL  Final  Culture, blood (routine x 2)     Status: None   Collection Time: 09/25/16 12:32 AM  Result Value Ref Range Status   Specimen Description BLOOD RIGHT ARM  Final   Special Requests   Final    BOTTLES DRAWN AEROBIC AND ANAEROBIC Blood Culture adequate volume   Culture NO GROWTH 5 DAYS  Final   Report Status 09/30/2016 FINAL  Final     Radiology Studies: No results found.  Scheduled Meds: . buprenorphine-naloxone  1 tablet Sublingual Daily  . enoxaparin (LOVENOX) injection  40 mg Subcutaneous Q24H  . sodium chloride flush  3 mL Intravenous Q12H   Continuous Infusions: . sodium chloride 250 mL (10/02/16 1616)  . vancomycin 750 mg (10/03/16 1525)     LOS: 8 days   Velvet Bathe, MD [facility] 10/03/2016, 5:13 PM Pager: [phone]  If 7PM-7AM, please contact night-coverage [email or web address] Password [facility] 10/03/2016, 5:13 PM

## 2016-10-04 MED ORDER — DOCUSATE SODIUM 100 MG PO CAPS
100.0000 mg | ORAL_CAPSULE | Freq: Every day | ORAL | Status: DC | PRN
  Administered 2016-10-04 – 2016-10-07 (×3): 100 mg via ORAL
  Filled 2016-10-04 (×3): qty 1

## 2016-10-04 NOTE — Progress Notes (Signed)
Pharmacy Antibiotic Note  [name] is a 46 y.o. female admitted on 09/24/2016 with sternoclavicular wound.  Pharmacy was consulted for vancomycin dosing. 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     Last Vanc trough level was at goa (18 mcg/ml) on 10/01/16.     Target vanc troughs 15-20 mcg/ml    Per ID note 09/27/16, planning vancomycin thru 11/02/16. Or, if unable to stay, may transition to oral Linezolid to finish a total of 8 weeks antibiotics.  Plan:  Continue Vancomycin 750mg  IV q8 hrs.  Follow renal function, progress, plans.  Next Vanc trough due by 10/08/16.  Height: 5\' 1"  (154.9 cm) Weight: 172 lb 4.8 oz (78.2 kg) IBW/kg (Calculated) : 47.8  Temp (24hrs), Avg:98 F (36.7 C), Min:97.8 F (36.6 C), Max:98.3 F (36.8 C)   Recent Labs Lab 09/28/16 1245 10/01/16 0653 10/01/16 1343 10/02/16 0438  WBC  --   --  10.0  --   CREATININE  --  0.80 0.91 0.95  VANCOTROUGH 22* 18  --   --     Estimated Creatinine Clearance: 71.6 mL/min (by C-G formula based on SCr of 0.95 mg/dL).    Allergies  Allergen Reactions  . Mango Flavor Hives and Swelling  . Doxycycline Other (See Comments)    "BURNS" >> ? INCREASED SENSITIVITY TO SUNLIGHT ?  . Pyridium [Phenazopyridine Hcl] Other (See Comments)    Irritation to skin  . Tramadol Nausea And Vomiting    Antimicrobials this admission:   vanc 5/22 >> (7/24)   cefepime 5/22 >> 5/30  Dose adjustments this admission: 5/26 VT: 12 on 1000 mg IV q12 5/30 VT 15 on 1250 mg IV q12 6/5 VT 12 (dose given late)- inc 1250 mg IV q8h for calc'd VT ~18.  6/7 VT 22 on 1250mg  q8 >> decr 1g IV q8h 6/19 VT = 22 on 1g q8>>decr to 750 mg IV q8h 6/22 VT = 18 on 750 mg IV Q8H  Microbiology results: 5/22 MRSA PCR: negative 5/22 Blood x 2 - negative 5/24 sternoclavicular  abscess: few MRSA, (MIC to Vanc = 1), rare CoNS 5/24 fungal cx: no fungus observed 6/16 Blood x 2 - negative  Thank you for allowing pharmacy to be a part of this patient's care.  Arty Baumgartner, [place] Pager: [phone] 10/04/2016 3:57 PM

## 2016-10-04 NOTE — Progress Notes (Signed)
PROGRESS NOTE    [name]  ZPH:[id number] DOB: 1971-03-05 DOA: 09/24/2016 PCP: Patient, [name]   Brief Narrative: [name] is a 46 y.o. female with a history of IV drug use, hepatitis C, septic arthritis, endocarditis, sternoclavicular abscess. She presents after leaving AMA for treatment of her abscess. She continues IV antibiotics at this moment.  Assessment & Plan:   Principal Problem:   Abscess of chest ([place]) Active Problems:   Polysubstance abuse   IV drug abuse   COPD (chronic obstructive pulmonary disease) (HCC)   Pulmonary embolism, septic (HCC)   Septic arthritis of right sternoclavicular joint (HCC)   Endocarditis of tricuspid valve   Wound infection   Abscess of chest Septic arthritis of right sternoclavicular joint Previous plans to treat with vancomycin for a total of 8 weeks. Given history of patient leaving AMA she is not candidate for SNF placement -continue vancomycin. ID recommending to discharge patient on linezolid if she threatens to leave [facility] - Plastic surgery on board and managing wound VAC - Plan is to continue antibiotic plan developed by infectious disease: planning vancomycin thru 11/02/16. However patient unable to stay we may transition to oral linezolid to finish a total of 8 weeks  IV drug abuse Current user. Patient has been counseled on abstinence from cocaine and heroin use -Patient is currently on suboxone  COPD Stable. No wheezes  History of endocarditis Last TEE on 09/02/2016 significant for no vegetations.   DVT prophylaxis: Lovenox Code Status: Full code Family Communication: None at bedside, updated patient directly Disposition Plan: Continue antibiotic as outlined above   Consultants:   Infectious disease  Procedures:   None  Antimicrobials:  Vancomycin    Subjective: No new complaints reported by patient  Objective: Vitals:   10/03/16 1406 10/03/16 2238 10/04/16 0614 10/04/16 1446  BP: 118/67  122/60 (!) 113/55 108/68  Pulse: 66 71 62 75  Resp: 17 20 20 20   Temp: 98 F (36.7 C) 97.8 F (36.6 C) 98.3 F (36.8 C) 98 F (36.7 C)  TempSrc: Oral  Oral Oral  SpO2: 100% 100% 99% 100%  Weight:      Height:        Intake/Output Summary (Last 24 hours) at 10/04/16 1821 Last data filed at 10/04/16 1444  Gross per 24 hour  Intake              780 ml  Output                0 ml  Net              780 ml   Filed Weights   09/24/16 2316 09/25/16 0957  Weight: 74.4 kg (164 lb) 78.2 kg (172 lb 4.8 oz)    Examination:Physical exam is unchanged when compared to yesterday's exam on 10/03/2016  General exam:  Appears calm and comfortable, in nad Respiratory system:  Equal chest rise, no wheezes, Respiratory effort normal. No wheezes on exam. Central nervous system: Alert and oriented. No facial asymmetry, answers questions appropriately Extremities: No edema. No calf tenderness Skin: Right upper chest wound dressing is clean and intact with wound VAC in place. Warm and dry otherwise. Psychiatry:  Mood & affect appropriate.    Data Reviewed: I have personally reviewed following labs and imaging studies  CBC:  Recent Labs Lab 10/01/16 1343  WBC 10.0  NEUTROABS 5.6  HGB 10.5*  HCT 33.1*  MCV 77.2*  PLT 801   Basic Metabolic  Panel:  Recent Labs Lab 10/01/16 0653 10/01/16 1343 10/02/16 0438  NA 135 135  --   K 4.1 3.6  --   CL 104 103  --   CO2 24 26  --   GLUCOSE 99 86  --   BUN 12 11  --   CREATININE 0.80 0.91 0.95  CALCIUM 8.4* 8.6*  --    GFR: Estimated Creatinine Clearance: 71.6 mL/min (by C-G formula based on SCr of 0.95 mg/dL). Liver Function Tests:  Recent Labs Lab 10/01/16 1343  AST 23  ALT 22  ALKPHOS 67  BILITOT 0.5  PROT 7.9  ALBUMIN 3.3*    Recent Results (from the past 240 hour(s))  Culture, blood (routine x 2)     Status: None   Collection Time: 09/25/16 12:24 AM  Result Value Ref Range Status   Specimen Description BLOOD LEFT ARM   Final   Special Requests   Final    BOTTLES DRAWN AEROBIC AND ANAEROBIC Blood Culture results may not be optimal due to an inadequate volume of blood received in culture bottles   Culture NO GROWTH 5 DAYS  Final   Report Status 09/30/2016 FINAL  Final  Culture, blood (routine x 2)     Status: None   Collection Time: 09/25/16 12:32 AM  Result Value Ref Range Status   Specimen Description BLOOD RIGHT ARM  Final   Special Requests   Final    BOTTLES DRAWN AEROBIC AND ANAEROBIC Blood Culture adequate volume   Culture NO GROWTH 5 DAYS  Final   Report Status 09/30/2016 FINAL  Final     Radiology Studies: No results found.  Scheduled Meds: . buprenorphine-naloxone  1 tablet Sublingual Daily  . enoxaparin (LOVENOX) injection  40 mg Subcutaneous Q24H  . sodium chloride flush  3 mL Intravenous Q12H   Continuous Infusions: . sodium chloride 250 mL (10/02/16 1616)  . vancomycin 750 mg (10/04/16 1629)     LOS: 9 days   Velvet Bathe, MD [facility] 10/04/2016, 6:21 PM Pager: [phone]  If 7PM-7AM, please contact night-coverage [email or web address] Password [facility] 10/04/2016, 6:21 PM

## 2016-10-05 NOTE — Progress Notes (Signed)
    [facility]    Date of Admission:  09/24/2016   Total days of antibiotics 31        Day 31 vanco   ID: [name] is a 46 y.o. female with MRSA right [place] septic arthritis osteo Principal Problem:   Abscess of chest ([place]) Active Problems:   Polysubstance abuse   IV drug abuse   COPD (chronic obstructive pulmonary disease) (HCC)   Pulmonary embolism, septic (HCC)   Septic arthritis of right sternoclavicular joint (HCC)   Endocarditis of tricuspid valve   Wound infection    Subjective: nop fever,chills, or nightsweats. Underwent wound vac yesterday doing well. Patient thinks that the pump has been signaling.    Medications:  . buprenorphine-naloxone  1 tablet Sublingual Daily  . enoxaparin (LOVENOX) injection  40 mg Subcutaneous Q24H  . sodium chloride flush  3 mL Intravenous Q12H    Objective: Vital signs in last 24 hours: Temp:  [98 F (36.7 C)-98.4 F (36.9 C)] 98.4 F (36.9 C) (06/26 5188) Pulse Rate:  [57-75] 57 (06/26 0634) Resp:  [18-20] 18 (06/26 0634) BP: (103-109)/(61-68) 103/66 (06/26 0634) SpO2:  [99 %-100 %] 99 % (06/26 4166)  Physical Exam  Constitutional:  oriented to person, place, and time. appears well-developed and well-nourished. No distress.  HENT: [place]/AT, PERRLA, no scleral icterus Mouth/Throat: Oropharynx is clear and moist. No oropharyngeal exudate.  Cardiovascular: Normal rate, regular rhythm and normal heart sounds. Exam reveals no gallop and no friction rub.  No murmur heard.  Pulmonary/Chest: Effort normal and breath sounds normal. No respiratory distress.  has no wheezes.  Chest wall = right chest wall wound vac in place at [facility] joint  Abdominal: Soft. Bowel sounds are normal.  exhibits no distension. There is no tenderness.  Skin: Skin is warm and dry. No rash noted. No erythema.  Psychiatric: a normal mood and affect.  behavior is normal.    Lab Results No results for input(s): WBC, HGB, HCT, NA, K, CL,  CO2, BUN, CREATININE, GLU in the last 72 hours.  Invalid input(s): PLATELETS Lab Results  Component Value Date   ESRSEDRATE 76 (H) 10/01/2014   No results found for: CRP  Microbiology: 5/24 wound MRSA Studies/Results: No results found.   Assessment/Plan: MRSA [place] joint septic arthritis = currently on day 31 of vancomycin with plan to keep til day 42. With likely 2 wk of orals to finish out total course of 8 wks.   Opiate dependence = continue with suboxone. 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

## 2016-10-05 NOTE — Progress Notes (Signed)
PROGRESS NOTE    [name]  [id number]:[id number] DOB: 1970/07/29 DOA: 09/24/2016 PCP: Patient, [name]   Brief Narrative: [name] is a 46 y.o. female with a history of IV drug use, hepatitis C, septic arthritis, endocarditis, sternoclavicular abscess. She presents after leaving AMA for treatment of her abscess. She continues IV antibiotics at this moment.  Plan is for vancomycin through 11/02/2016. If patient leaves AMA then discharged on linezolid per ID recommendations.  Assessment & Plan:   Principal Problem:   Abscess of chest ([place]) Active Problems:   Polysubstance abuse   IV drug abuse   COPD (chronic obstructive pulmonary disease) (HCC)   Pulmonary embolism, septic (HCC)   Septic arthritis of right sternoclavicular joint (HCC)   Endocarditis of tricuspid valve   Wound infection   Abscess of chest Septic arthritis of right sternoclavicular joint Previous plans to treat with vancomycin for a total of 8 weeks. Given history of patient leaving AMA she is not candidate for SNF placement -continue vancomycin. ID recommending to discharge patient on linezolid if she threatens to leave [facility] - Plastic surgery on board and managing wound VAC - Plan is to continue antibiotic plan developed by infectious disease: planning vancomycin thru 11/02/16. However patient unable to stay we may transition to oral linezolid to finish a total of 8 weeks  IV drug abuse Current user. Patient has been counseled on abstinence from cocaine and heroin use -Patient is currently on suboxone  COPD No wheezes, stable  History of endocarditis Last TEE on 09/02/2016 reported no vegetations.   DVT prophylaxis: Lovenox Code Status: Full code Family Communication: None at bedside, updated patient directly Disposition Plan: Continue antibiotic as outlined above   Consultants:   Infectious disease  Procedures:   None  Antimicrobials:  Vancomycin    Subjective: No new complaints  reported to me today  Objective: Vitals:   10/04/16 1446 10/04/16 2157 10/05/16 0634 10/05/16 1443  BP: 108/68 109/61 103/66 114/73  Pulse: 75 66 (!) 57 77  Resp: 20 18 18 19   Temp: 98 F (36.7 C) 98.1 F (36.7 C) 98.4 F (36.9 C) 97.8 F (36.6 C)  TempSrc: Oral     SpO2: 100% 100% 99% 98%  Weight:      Height:       No intake or output data in the 24 hours ending 10/05/16 2043 Filed Weights   09/24/16 2316 09/25/16 0957  Weight: 74.4 kg (164 lb) 78.2 kg (172 lb 4.8 oz)    Examination:Physical exam is unchanged when compared to yesterday's exam on 10/04/2016  General exam:  Appears calm and comfortable, in nad Respiratory system:  Equal chest rise, no wheezes, Respiratory effort normal. No wheezes on exam. Central nervous system: Alert and oriented. No facial asymmetry, answers questions appropriately Extremities: No edema. No calf tenderness Skin: Right upper chest wound dressing is clean and intact with wound VAC in place. Warm and dry otherwise. Psychiatry:  Mood & affect appropriate.    Data Reviewed: I have personally reviewed following labs and imaging studies  CBC:  Recent Labs Lab 10/01/16 1343  WBC 10.0  NEUTROABS 5.6  HGB 10.5*  HCT 33.1*  MCV 77.2*  PLT 130   Basic Metabolic Panel:  Recent Labs Lab 10/01/16 0653 10/01/16 1343 10/02/16 0438  NA 135 135  --   K 4.1 3.6  --   CL 104 103  --   CO2 24 26  --   GLUCOSE 99 86  --  BUN 12 11  --   CREATININE 0.80 0.91 0.95  CALCIUM 8.4* 8.6*  --    GFR: Estimated Creatinine Clearance: 71.6 mL/min (by C-G formula based on SCr of 0.95 mg/dL). Liver Function Tests:  Recent Labs Lab 10/01/16 1343  AST 23  ALT 22  ALKPHOS 67  BILITOT 0.5  PROT 7.9  ALBUMIN 3.3*    No results found for this or any previous visit (from the past 240 hour(s)).   Radiology Studies: No results found.  Scheduled Meds: . buprenorphine-naloxone  1 tablet Sublingual Daily  . enoxaparin (LOVENOX) injection  40  mg Subcutaneous Q24H  . sodium chloride flush  3 mL Intravenous Q12H   Continuous Infusions: . sodium chloride 250 mL (10/02/16 1616)  . vancomycin Stopped (10/05/16 1622)     LOS: 10 days   Velvet Bathe, MD [facility] 10/05/2016, 8:43 PM Pager: [phone]  If 7PM-7AM, please contact night-coverage [email or web address] Password [facility] 10/05/2016, 8:43 PM

## 2016-10-06 LAB — BASIC METABOLIC PANEL
Anion gap: 7 (ref 5–15)
BUN: 17 mg/dL (ref 6–20)
CO2: 24 mmol/L (ref 22–32)
Calcium: 8.9 mg/dL (ref 8.9–10.3)
Chloride: 103 mmol/L (ref 101–111)
Creatinine, Ser: 0.86 mg/dL (ref 0.44–1.00)
GFR calc Af Amer: >60 mL/min (ref >60)
GFR calc non Af Amer: >60 mL/min (ref >60)
Glucose, Bld: 120 mg/dL — ABNORMAL HIGH (ref 65–99)
Potassium: 4.5 mmol/L (ref 3.5–5.1)
Sodium: 134 mmol/L — ABNORMAL LOW (ref 135–145)

## 2016-10-06 NOTE — Progress Notes (Signed)
PROGRESS NOTE    [name]  OVZ:[id number] DOB: 1970-09-07 DOA: 09/24/2016 PCP: Patient, [name]   Brief Narrative: [name] is a 46 y.o. female with a history of IV drug use, hepatitis C, septic arthritis, endocarditis, sternoclavicular abscess. She presents after leaving AMA for treatment of her abscess. She continues IV antibiotics at this moment.  Plan is for vancomycin through 11/02/2016. If patient leaves AMA then discharged on linezolid per ID recommendations.  Assessment & Plan:   Principal Problem:   Abscess of chest ([place]) Active Problems:   Polysubstance abuse   IV drug abuse   COPD (chronic obstructive pulmonary disease) (HCC)   Pulmonary embolism, septic (HCC)   Septic arthritis of right sternoclavicular joint (HCC)   Endocarditis of tricuspid valve   Wound infection   Abscess of chest Septic arthritis of right sternoclavicular joint Previous plans to treat with vancomycin for a total of 8 weeks. Given history of patient leaving AMA she is not candidate for SNF placement -continue vancomycin. ID recommending to discharge patient on linezolid if she threatens to leave [facility] - Plastic surgery on board and managing wound VAC - Plan is to continue antibiotic plan developed by infectious disease: planning vancomycin thru 11/02/16. However patient unable to stay we may transition to oral linezolid to finish a total of 8 weeks - Continue current medical plan listed above  IV drug abuse Current user. Patient has been counseled on abstinence from cocaine and heroin use -Patient is currently on suboxone no anxiety symptoms or withdrawal symptoms reported  COPD No wheezes, stable  History of endocarditis Last TEE on 09/02/2016 reported no vegetations.   DVT prophylaxis: Lovenox Code Status: Full code Family Communication: None at bedside, updated patient directly Disposition Plan: Continue antibiotic as outlined above   Consultants:   Infectious  disease  Procedures:   None  Antimicrobials:  Vancomycin   Subjective: Patient has no new complaints for me today.  Objective: Vitals:   10/05/16 0634 10/05/16 1443 10/05/16 2157 10/06/16 0537  BP: 103/66 114/73 100/66 (!) 101/57  Pulse: (!) 57 77 76 (!) 56  Resp: 18 19 18 18   Temp: 98.4 F (36.9 C) 97.8 F (36.6 C) 97.8 F (36.6 C) 98.5 F (36.9 C)  TempSrc:      SpO2: 99% 98% 100% 99%  Weight:      Height:       No intake or output data in the 24 hours ending 10/06/16 1231 Filed Weights   09/24/16 2316 09/25/16 0957  Weight: 74.4 kg (164 lb) 78.2 kg (172 lb 4.8 oz)    Examination:Physical exam is unchanged when compared to yesterday's exam on 10/05/2016  General exam:  Appears calm and comfortable, in nad Respiratory system:  Equal chest rise, no wheezes, Respiratory effort normal. No wheezes on exam. Central nervous system: Alert and oriented. No facial asymmetry, answers questions appropriately Extremities: No edema. No calf tenderness Skin: Right upper chest wound dressing is clean and intact with wound VAC in place. Warm and dry otherwise. Psychiatry:  Mood & affect appropriate.    Data Reviewed: I have personally reviewed following labs and imaging studies  CBC:  Recent Labs Lab 10/01/16 1343  WBC 10.0  NEUTROABS 5.6  HGB 10.5*  HCT 33.1*  MCV 77.2*  PLT 412   Basic Metabolic Panel:  Recent Labs Lab 10/01/16 0653 10/01/16 1343 10/02/16 0438 10/06/16 0736  NA 135 135  --  134*  K 4.1 3.6  --  4.5  CL 104 103  --  103  CO2 24 26  --  24  GLUCOSE 99 86  --  120*  BUN 12 11  --  17  CREATININE 0.80 0.91 0.95 0.86  CALCIUM 8.4* 8.6*  --  8.9   GFR: Estimated Creatinine Clearance: 79.1 mL/min (by C-G formula based on SCr of 0.86 mg/dL). Liver Function Tests:  Recent Labs Lab 10/01/16 1343  AST 23  ALT 22  ALKPHOS 67  BILITOT 0.5  PROT 7.9  ALBUMIN 3.3*    No results found for this or any previous visit (from the past 240  hour(s)).   Radiology Studies: No results found.  Scheduled Meds: . buprenorphine-naloxone  1 tablet Sublingual Daily  . enoxaparin (LOVENOX) injection  40 mg Subcutaneous Q24H  . sodium chloride flush  3 mL Intravenous Q12H   Continuous Infusions: . sodium chloride 250 mL (10/02/16 1616)  . vancomycin Stopped (10/06/16 0839)     LOS: 11 days   Julie Bathe, MD [facility] 10/06/2016, 12:31 PM Pager: [phone]  If 7PM-7AM, please contact night-coverage [email or web address] Password [facility] 10/06/2016, 12:31 PM

## 2016-10-07 DIAGNOSIS — K5900 Constipation, unspecified: Secondary | ICD-10-CM

## 2016-10-07 DIAGNOSIS — K649 Unspecified hemorrhoids: Secondary | ICD-10-CM

## 2016-10-07 MED ORDER — DOCUSATE SODIUM 100 MG PO CAPS
100.0000 mg | ORAL_CAPSULE | Freq: Two times a day (BID) | ORAL | Status: DC
  Administered 2016-10-08 – 2016-10-18 (×21): 100 mg via ORAL
  Filled 2016-10-07 (×21): qty 1

## 2016-10-07 MED ORDER — MUSCLE RUB 10-15 % EX CREA
TOPICAL_CREAM | CUTANEOUS | Status: DC | PRN
  Administered 2016-10-07: 11:00:00 via TOPICAL
  Filled 2016-10-07: qty 85

## 2016-10-07 MED ORDER — POLYETHYLENE GLYCOL 3350 17 G PO PACK: 17.0000 g | PACK | Freq: Every day | ORAL | Status: DC | PRN

## 2016-10-07 MED ORDER — HYDROCORTISONE ACETATE 25 MG RE SUPP
25.0000 mg | Freq: Two times a day (BID) | RECTAL | Status: AC
  Administered 2016-10-08 – 2016-10-10 (×6): 25 mg via RECTAL
  Filled 2016-10-07 (×6): qty 1

## 2016-10-07 NOTE — Progress Notes (Addendum)
PROGRESS NOTE    [name]  [id number] DOB: 11-30-1970 DOA: 09/24/2016 PCP: Patient, [name]   Brief Narrative: [name] is a 46 y.o. female with a history of IV drug use, hepatitis C, septic arthritis, endocarditis, sternoclavicular abscess. She presents after leaving AMA for treatment of her abscess. She continues IV antibiotics at this moment.  Plan is for vancomycin for 10 more days and if stable, discharge on linezolid for 2 more weeks.  Assessment & Plan:   Principal Problem:   Abscess of chest ([place]) Active Problems:   Polysubstance abuse   IV drug abuse   COPD (chronic obstructive pulmonary disease) (HCC)   Pulmonary embolism, septic (HCC)   Septic arthritis of right sternoclavicular joint (HCC)   Endocarditis of tricuspid valve   Wound infection   Abscess of chest Septic arthritis of right sternoclavicular joint -Previous plans to treat with vancomycin for a total of 8 weeks. -Given history of patient leaving AMA she is not candidate for SNF placement -per ID recommendations will continue IV vancomycin for 10 more days and then discharge on linezolid for 2 weeks -patient will ned follow up with ID after discharge -no fever and systemic infection appears controlled. -plastic surgery on board and managing wound-vac and dictating wound care. -will continue PRN analgesics and follow response. -pharmacy following, monitoring and adjusting vancomycin level. -vanc target level 15-20 mcg/ml  IV drug abuse -cessation counseling provided -patient has been started on suboxone  -to be evaluated by Dr. Daryll Drown, to assess possibility for referral to internal medicine suboxone clinic if patient found to be an appropriate candidate for tx. -no signs of active withdrawal -will monitor and continue treatment.  Chronic hep C -patient needs to be sober/abstinent of drug use for 6 months -and then determine treatment options -outpatient follow up with ID  service  COPD -no SOB and no wheezing -will continue PRN albuterol   History of endocarditis -Last TEE on 09/02/2016 reported no vegetations. -afebrile -no CP -will continue IV antibiotics   Constipation/hemorroids  -will continue colace, but change to BID -will also start miralax -anusol suppository ordered   DVT prophylaxis: Lovenox Code Status: Full code Family Communication: husband at bedside Disposition Plan: remains inpatient for now; continue IV antibiotics as dictated by ID; continue wound care.   Consultants:   Infectious disease  Plastic surgery   Procedures:   See below for x-ray reports   Antimicrobials:  Vancomycin   Subjective: Patient is in no acute distress. Afebrile, denies CP, SOB, nausea and vomiting. Complaining of constipation and rectal discomfort due to hemorrhoids   Objective: Vitals:   10/06/16 2153 10/07/16 0509 10/07/16 1524 10/07/16 2232  BP: 112/69 108/64 (!) 105/53 (!) 131/98  Pulse: 83 70 83 94  Resp: 18 18 18 18   Temp: 98.2 F (36.8 C) 98.1 F (36.7 C) 98 F (36.7 C) 97.6 F (36.4 C)  TempSrc:   Oral Oral  SpO2: 99% 99% 100% 100%  Weight:      Height:        Intake/Output Summary (Last 24 hours) at 10/07/16 2244 Last data filed at 10/07/16 1508  Gross per 24 hour  Intake             1860 ml  Output                0 ml  Net             1860 ml   [employer]  09/24/16 2316 09/25/16 0957  Weight: 74.4 kg (164 lb) 78.2 kg (172 lb 4.8 oz)    General exam:  Afebrile, no CP, no SOB, denies nausea and vomiting. Endorses constipation. Respiratory system: good air movement, no wheezing, no crackles. Cardiac exam: no rubs, no gallops, no JVD Central nervous system: AAOX3, no focal deficit, CN intact Extremities: no edema, no cyanosis, no clubbing  Skin: right upper chest wound is clean, with minimal drainage and with wound-vac in place. Psychiatry:  Mood and affect are normal, no SI, hallucinations or agitation.    Data Reviewed: I have personally reviewed following labs and imaging studies  CBC:  Recent Labs Lab 10/01/16 1343  WBC 10.0  NEUTROABS 5.6  HGB 10.5*  HCT 33.1*  MCV 77.2*  PLT 818   Basic Metabolic Panel:  Recent Labs Lab 10/01/16 0653 10/01/16 1343 10/02/16 0438 10/06/16 0736  NA 135 135  --  134*  K 4.1 3.6  --  4.5  CL 104 103  --  103  CO2 24 26  --  24  GLUCOSE 99 86  --  120*  BUN 12 11  --  17  CREATININE 0.80 0.91 0.95 0.86  CALCIUM 8.4* 8.6*  --  8.9   GFR: Estimated Creatinine Clearance: 79.1 mL/min (by C-G formula based on SCr of 0.86 mg/dL).   Liver Function Tests:  Recent Labs Lab 10/01/16 1343  AST 23  ALT 22  ALKPHOS 67  BILITOT 0.5  PROT 7.9  ALBUMIN 3.3*    Radiology Studies: No results found.  Scheduled Meds: . buprenorphine-naloxone  1 tablet Sublingual Daily  . enoxaparin (LOVENOX) injection  40 mg Subcutaneous Q24H  . sodium chloride flush  3 mL Intravenous Q12H   Continuous Infusions: . sodium chloride 250 mL (10/02/16 1616)  . vancomycin 750 mg (10/07/16 1508)     LOS: 12 days   Barton Dubois, MD [facility] 10/07/2016, 10:44 PM Pager: [phone]  If 7PM-7AM, please contact night-coverage [email or web address] Password [facility] 10/07/2016, 10:44 PM

## 2016-10-08 DIAGNOSIS — I2690 Septic pulmonary embolism without acute cor pulmonale: Secondary | ICD-10-CM

## 2016-10-08 LAB — BASIC METABOLIC PANEL
Anion gap: 7 (ref 5–15)
BUN: 16 mg/dL (ref 6–20)
CO2: 23 mmol/L (ref 22–32)
Calcium: 8.7 mg/dL — ABNORMAL LOW (ref 8.9–10.3)
Chloride: 104 mmol/L (ref 101–111)
Creatinine, Ser: 0.87 mg/dL (ref 0.44–1.00)
GFR calc Af Amer: >60 mL/min (ref >60)
GFR calc non Af Amer: >60 mL/min (ref >60)
Glucose, Bld: 122 mg/dL — ABNORMAL HIGH (ref 65–99)
Potassium: 4.2 mmol/L (ref 3.5–5.1)
Sodium: 134 mmol/L — ABNORMAL LOW (ref 135–145)

## 2016-10-08 LAB — VANCOMYCIN, TROUGH: Vancomycin Tr: 11 ug/mL — ABNORMAL LOW (ref 15–20)

## 2016-10-08 MED ORDER — BUPRENORPHINE HCL-NALOXONE HCL 8-2 MG SL SUBL
1.0000 | SUBLINGUAL_TABLET | Freq: Two times a day (BID) | SUBLINGUAL | Status: DC
  Administered 2016-10-08 – 2016-10-18 (×20): 1 via SUBLINGUAL
  Filled 2016-10-08 (×21): qty 1

## 2016-10-08 NOTE — Progress Notes (Signed)
I was fixing to change patient's wound vac due to it having issue with suctions. I removed the wound vac and told Dr. Baxter Flattery that the areas around the wound were getting red and more irritated. I told her I was afraid to put it back and cause more damage to the surrounding healthy tissue. She told me to just keep the wound vac off and keep it covered with a nonadherent gauze.

## 2016-10-08 NOTE — Progress Notes (Addendum)
Pharmacy Antibiotic Note  [name] is a 46 y.o. female admitted on 09/24/2016 with sternoclavicular wound.  Pharmacy was consulted for vancomycin dosing. 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     Vanc trough level today is 11 mcg/ml, but drawn ~2 hrs after dose was due.  Expect true trough is still at goal.  Creatinine stable.   Target vanc troughs 15-20 mcg/ml     Day # 34 Vancomycin. Per ID plan, to stay as inpatient until day #42 (10/16/16), then to transition to oral Linezolid to finish a total of 8 weeks antibiotics (thru 10/30/16)  - addendum 6/30: recounted days of antibiotics based on 6/26 ID note as day #31.  - day #42 = 7/7 (not 7/10 as previously noted)  Plan:  Continue Vancomycin 750mg  IV q8 hrs.  Follow renal function, progress, plans.  Bmet every 3 days while inpatient.  Vanc trough at least weekly, next by 10/15/16.  Height: 5\' 1"  (154.9 cm) Weight: 172 lb 4.8 oz (78.2 kg) IBW/kg (Calculated) : 47.8  Temp (24hrs), Avg:97.8 F (36.6 C), Min:97.6 F (36.4 C), Max:98 F (36.7 C)   Recent Labs Lab 10/01/16 1343 10/02/16 0438 10/06/16 0736 10/08/16 0908  WBC 10.0  --   --   --   CREATININE 0.91 0.95 0.86 0.87  VANCOTROUGH  --   --   --  11*    Estimated Creatinine Clearance: 78.2 mL/min (by C-G formula based on SCr of 0.87 mg/dL).    Allergies  Allergen Reactions  . Mango Flavor Hives and Swelling  . Doxycycline Other (See Comments)    "BURNS" >> ? INCREASED SENSITIVITY TO SUNLIGHT ?  . Pyridium [Phenazopyridine Hcl] Other (See Comments)    Irritation to skin  . Tramadol Nausea And Vomiting    Antimicrobials this admission:   vanc 5/22 >> (7/24)   cefepime 5/22 >> 5/30  Dose adjustments this admission: 5/26 VT: 12 on 1000 mg IV q12 5/30 VT 15 on 1250 mg IV q12 6/5 VT 12  (dose given late)- inc 1250 mg IV q8h for calc'd VT ~18.  6/7 VT 22 on 1250mg  q8 >> decr 1g IV q8h 6/19 VT = 22 on 1g q8>>decr to 750 mg IV q8h 6/22 VT = 18 on 750 mg IV Q8H 6/29 VT = 11 mcg/ml on 750 mg IV q8h - no change, trough ~2 hrs late  Microbiology results: 5/22 MRSA PCR: negative 5/22 Blood x 2 - negative 5/24 sternoclavicular abscess: few MRSA, (MIC to Vanc = 1), rare CoNS 5/24 fungal cx: no fungus observed 6/16 Blood x 2 - negative  Thank you for allowing pharmacy to be a part of this patient's care.  Arty Baumgartner, [place] Pager: [phone] 10/08/2016 11:52 AM

## 2016-10-08 NOTE — Progress Notes (Signed)
PROGRESS NOTE    [name]  FYB:[id number] DOB: [birth year] DOA: 09/24/2016 PCP: Patient, [name]   Brief Narrative: [name] is a 46 y.o. female with a history of IV drug use, hepatitis C, septic arthritis, endocarditis, sternoclavicular abscess. She presents after leaving AMA for treatment of her abscess. She continues IV antibiotics at this moment.  Plan is for vancomycin for 10 more days and if stable, discharge on linezolid for 2 more weeks.  Assessment & Plan:   Principal Problem:   Abscess of chest ([place]) Active Problems:   Polysubstance abuse   IV drug abuse   COPD (chronic obstructive pulmonary disease) (HCC)   Pulmonary embolism, septic (HCC)   Septic arthritis of right sternoclavicular joint (HCC)   Endocarditis of tricuspid valve   Wound infection   Abscess of chest Septic arthritis of right sternoclavicular joint -Previous plans to treat with vancomycin for a total of 8 weeks. -Given history of patient leaving AMA she is not candidate for SNF placement -per ID recommendations will continue IV vancomycin for 9 more days and then discharge on linezolid for 2 weeks -patient will ned follow up with ID after discharge -no fever and without signs of systemic infection. -will follow rec's from plastic surgery regarding wound care -pharmacy following, monitoring and adjusting vancomycin level. -vanc target level 15-20 mcg/ml -stable overall.  IV drug abuse -cessation counseling provided -will continue Suboxone -patient seen by Dr. Daryll Drown and found to be a good candidate for suboxone clinic. -no signs of withdrawal -doing good overall  Chronic hep C -follow up with ID as an outpatient -needs 6 months sober and then can be a candidate for treatment. -LFT's stable   COPD -good air movement bilaterally  -will continue PRN albuterol and also PFT's as an outpatient for classification and stratification  History of endocarditis -Last TEE on 09/02/2016  reported no vegetations. -patient has remained afebrile and denying CP -will continue IV antibiotics.  Constipation/hemorroids  -continue BID colace and continue miralax -will also continue anusol   DVT prophylaxis: Lovenox Code Status: Full code Family Communication: husband at bedside Disposition Plan: remains inpatient for now; continue IV antibiotics as dictated by ID; continue wound care by plastic surgery.   Consultants:   Infectious disease  Plastic surgery   Procedures:   See below for x-ray reports   Antimicrobials:  Vancomycin   Subjective: Afebrile, no CP, no nausea, no vomiting. Good affect and reported doing good in general.  Objective: Vitals:   10/08/16 0055 10/08/16 0530 10/08/16 1512 10/08/16 2130  BP: 110/60 116/62 (!) 106/54 (!) 106/52  Pulse: 92 87 82 74  Resp:  18 17 18   Temp:  97.8 F (36.6 C) 98.4 F (36.9 C) 98.7 F (37.1 C)  TempSrc:  Oral Oral   SpO2:  96% 99% 100%  Weight:      Height:        Intake/Output Summary (Last 24 hours) at 10/08/16 2336 Last data filed at 10/08/16 1510  Gross per 24 hour  Intake              720 ml  Output                0 ml  Net              720 ml   Filed Weights   09/24/16 2316 09/25/16 0957  Weight: 74.4 kg (164 lb) 78.2 kg (172 lb 4.8 oz)    Patient physical exam  unchanged from examination done on 10/08/15.  General exam:  Afebrile, no CP, no SOB, denies nausea and vomiting. Endorses constipation. Respiratory system: good air movement, no wheezing, no crackles. Cardiac exam: no rubs, no gallops, no JVD, positive SEM Central nervous system: AAOX3, no focal deficit, CN intact Extremities: no edema, no cyanosis, no clubbing  Skin: right upper chest wound is clean, with minimal drainage and with wound-vac in place. Psychiatry:  Mood and affect are normal, no SI, hallucinations or agitation.   Data Reviewed: I have personally reviewed following labs and imaging studies  Basic Metabolic  Panel:  Recent Labs Lab 10/02/16 0438 10/06/16 0736 10/08/16 0908  NA  --  134* 134*  K  --  4.5 4.2  CL  --  103 104  CO2  --  24 23  GLUCOSE  --  120* 122*  BUN  --  17 16  CREATININE 0.95 0.86 0.87  CALCIUM  --  8.9 8.7*   GFR: Estimated Creatinine Clearance: 78.2 mL/min (by C-G formula based on SCr of 0.87 mg/dL).   Liver Function Tests: No results for input(s): AST, ALT, ALKPHOS, BILITOT, PROT, ALBUMIN in the last 168 hours.  Radiology Studies: No results found.  Scheduled Meds: . buprenorphine-naloxone  1 tablet Sublingual BID  . docusate sodium  100 mg Oral BID  . enoxaparin (LOVENOX) injection  40 mg Subcutaneous Q24H  . hydrocortisone  25 mg Rectal BID  . sodium chloride flush  3 mL Intravenous Q12H   Continuous Infusions: . sodium chloride 250 mL (10/02/16 1616)  . vancomycin 750 mg (10/08/16 1711)     LOS: 13 days   Barton Dubois, MD [facility] 10/08/2016, 11:36 PM Pager: [phone]  If 7PM-7AM, please contact night-coverage [email or web address] Password [email or web address] 10/08/2016, 11:36 PM

## 2016-10-08 NOTE — Progress Notes (Signed)
Date: 10/08/2016  Patient name: [name]  Medical record number: [id number]  Date of birth: 10-22-1970   I evaluated [name] today at the request of her inpatient physician Dr. Dyann Kief to evaluate for appropriateness for continued suboxone therapy in our clinic.  [name] was admitted on 09/25/16 with abscess of the chest wall and septic sternoclavicular joint on the right side.  She left AMA from last hospitalization because her withdrawal from opioids was too severe.  She is being treated with a long course of antibiotics currently, with stop date of IV abx of 7/10 and plan for 2 weeks of linezolid at discharge.  [name] has had many complications related to her ongoing IVDU including abscesses in bilateral forearms and endocarditis.    [name] reports that she began using heroin about 5 years ago.  She was successfully clean for an 8 month period using Suboxone films.  However, her mother died and she began using heroin again.  She started by injecting crushed opana and moved to heroin.  She has had "dope-sickness" multiple times in the past, but feels that suboxone has helped her through the worst parts of her withdrawal.  Prior to admission she was using about 1gm of heroin per day in 4-6 doses.  She has cravings for opioids, continued use despite interpersonal problems, reduced activities with her family due to use of medications (will not see grandkids when high or withdrawing), use despite knowing the health consequences (multiple complications as above), tolerance and withdrawal; she has had a wish to cut back.  She meets criteria for severe Opioid Use Disorder by the DSM-5 criteria.  She has used cocaine in the past, but only socially when a specific friend brings it over. She reports her drug of choice is heroin.   [name] reports history of hepatitis C (Positive result in Dec 2017, type 1a).  She had LFTs checked on 6/22 which were WNL (AST 23/ALT 22).  HBV negative in 2016 and 2917.    Per chart review, she had mildly elevated LFTs in 2016 but these have resolved. She has further history of back pain and COPD per her, which is well controlled with inhalers.  She has medicare as her insurance reported in the chart, will need to confirm this.   On exam today, she appears older than stated age.  She has a wound vac to her upper right chest which has limited drainage and she reports the wound is getting smaller.  She is very fidgety and seems nervous, but was very conversant and pleasant.  She otherwise was breathing comfortably on room air and reported no pain to me.   I think [name] is a good candidate for suboxone therapy.  She has been successful with maintaining off of heroin for up to 8 months on this medication.  She has been in counseling before and we discussed finding a counselor close to her home while undergoing treatment if she should need one.  We also discussed narcotics anonymous to find support for herself.   She has done well on Suboxone TID in an outpatient clinic in the past.  She reports some withdrawal symptoms in the evenings.  Most patients on MAT do well initially on a dose of 16mg -4mg  daily.    Would recommend changing suboxone dose to 8-2 mg BID (increase from daily).    As her discharge date is some time in the future, either I or my colleague Dr. Evette Doffing, will visit  with her again next week and/or before discharge.  If she continues to do well, we will provide her with a prescription for suboxone and appointment for our internal medicine clinic.  She reports not having a PCP and she may be appropriate to be assigned a PCP In our clinic as well at that time.    Please call with any questions.     Gilles Chiquito, MD 09/28/16 [phone]

## 2016-10-09 NOTE — Progress Notes (Signed)
PROGRESS NOTE    [name]  EML:[id number] DOB: 03-12-1971 DOA: 09/24/2016 PCP: Patient, [name]   Brief Narrative: [name] is a 46 y.o. female with a history of IV drug use, hepatitis C, septic arthritis, endocarditis, sternoclavicular abscess. She presents after leaving AMA for treatment of her abscess. She continues IV antibiotics at this moment.  Plan is for vancomycin for 10 more days and if stable, discharge on linezolid for 2 more weeks.  Assessment & Plan:   Principal Problem:   Abscess of chest ([place]) Active Problems:   Polysubstance abuse   IV drug abuse   COPD (chronic obstructive pulmonary disease) (HCC)   Pulmonary embolism, septic (HCC)   Septic arthritis of right sternoclavicular joint (HCC)   Endocarditis of tricuspid valve   Wound infection   Abscess of chest Septic arthritis of right sternoclavicular joint -Previous plans to treat with vancomycin for a total of 8 weeks. -Given history of patient leaving AMA she is not candidate for SNF placement -per ID recommendations will continue IV vancomycin for 8 more days and then discharge on linezolid for 2 weeks -patient will need follow up with ID after discharge -overall stable and has remained afebrile now.  -will follow rec's from plastic surgery regarding wound care; given constant blockage and no much drainage from wound-vac, machine has been discontinued and waiting for assessment and further rec's from plastic surgery. -pharmacy following, monitoring and adjusting vancomycin level. -vanc target level 15-20 mcg/ml -will continue supportive care   IV drug abuse -cessation counseling provided -will continue Suboxone -patient seen by Dr. Daryll Drown and found to be a good candidate for suboxone clinic. -doing good overall and w/o acute signs of withdrawal  Chronic hep C -follow up with ID as an outpatient -needs 6 months sober and then can be a candidate for treatment. -LFT's has remained stable;  will monitor intermittently   COPD -good air movement bilaterally  -will continue PRN albuterol and also PFT's as an outpatient for classification and stratification  History of endocarditis -Last TEE on 09/02/2016 reported no vegetations. -patient has remained afebrile and denying CP -will continue IV antibiotics.  Constipation/hemorroids  -continue BID colace and continue miralax -will continue anusol   DVT prophylaxis: Lovenox Code Status: Full code Family Communication: husband at bedside Disposition Plan: remains inpatient for now; continue IV antibiotics as dictated by ID; continue wound care by plastic surgery.   Consultants:   Infectious disease  Plastic surgery   Procedures:   See below for x-ray reports   Antimicrobials:  Vancomycin   Subjective: Stable and in no acute distress. Patient denies CP, SOB, nausea and vomiting. She has remained afebrile.  Objective: Vitals:   10/08/16 1512 10/08/16 2130 10/09/16 0549 10/09/16 1506  BP: (!) 106/54 (!) 106/52 (!) 102/58 (!) 111/55  Pulse: 82 74 67 72  Resp: 17 18 18 18   Temp: 98.4 F (36.9 C) 98.7 F (37.1 C) 97.9 F (36.6 C) 97.8 F (36.6 C)  TempSrc: Oral  Oral Oral  SpO2: 99% 100% 97% 92%  Weight:      Height:        Intake/Output Summary (Last 24 hours) at 10/09/16 1548 Last data filed at 10/09/16 0340  Gross per 24 hour  Intake              370 ml  Output                0 ml  Net  370 ml   Filed Weights   09/24/16 2316 09/25/16 0957  Weight: 74.4 kg (164 lb) 78.2 kg (172 lb 4.8 oz)    General exam: afebrile, no CP, no SOB, no nausea, no abd pain. Patient reported improved in BM's. Respiratory system: no wheezing, no crackles, good air movement. No using accessory muscles. Cardiac exam: no rubs, no gallops, no JVD. Positive SEM. Denies palpitations. Central nervous system: AAOX3, non focal deficit, CN grossly intact.  Extremities: no edema, no cyanosis or clubbing Skin: right  upper chest wound with minimal to scant drainage; no odor, no surrounding infection appreciated. Wound-vac stopped. Psychiatry: stable, no SI or hallucinations. Patient is AAOX3 and with appropriate mood.  Data Reviewed: I have personally reviewed following labs and imaging studies  Basic Metabolic Panel:  Recent Labs Lab 10/06/16 0736 10/08/16 0908  NA 134* 134*  K 4.5 4.2  CL 103 104  CO2 24 23  GLUCOSE 120* 122*  BUN 17 16  CREATININE 0.86 0.87  CALCIUM 8.9 8.7*   GFR: Estimated Creatinine Clearance: 78.2 mL/min (by C-G formula based on SCr of 0.87 mg/dL).   Radiology Studies: No results found.  Scheduled Meds: . buprenorphine-naloxone  1 tablet Sublingual BID  . docusate sodium  100 mg Oral BID  . enoxaparin (LOVENOX) injection  40 mg Subcutaneous Q24H  . hydrocortisone  25 mg Rectal BID  . sodium chloride flush  3 mL Intravenous Q12H   Continuous Infusions: . sodium chloride 250 mL (10/02/16 1616)  . vancomycin Stopped (10/09/16 1033)     LOS: 14 days   Barton Dubois, MD [facility] 10/09/2016, 3:48 PM Pager: [phone]  If 7PM-7AM, please contact night-coverage [email or web address] Password [facility] 10/09/2016, 3:48 PM

## 2016-10-10 DIAGNOSIS — B182 Chronic viral hepatitis C: Secondary | ICD-10-CM

## 2016-10-10 NOTE — Progress Notes (Signed)
PROGRESS NOTE    [name]  RXV:[id number] DOB: [birth year] DOA: 09/24/2016 PCP: Patient, [name]   Brief Narrative: [name] is a 46 y.o. female with a history of IV drug use, hepatitis C, septic arthritis, endocarditis, sternoclavicular abscess. She presents after leaving AMA for treatment of her abscess. She continues IV antibiotics at this moment.  Plan is for vancomycin for 7 more days and if stable, discharge on linezolid for 2 more weeks.  Assessment & Plan:   Principal Problem:   Abscess of chest ([place]) Active Problems:   Polysubstance abuse   IV drug abuse   COPD (chronic obstructive pulmonary disease) (HCC)   Pulmonary embolism, septic (HCC)   Septic arthritis of right sternoclavicular joint (HCC)   Endocarditis of tricuspid valve   Wound infection   Abscess of chest Septic arthritis of right sternoclavicular joint -Previous plans to treat with vancomycin for a total of 8 weeks. -Given history of patient leaving AMA she is not candidate for SNF placement -per ID recommendations will continue IV vancomycin for 7 more days and then discharge on linezolid for 2 weeks -patient will need follow up with ID after discharge -overall stable and has remained afebrile now.  -will follow rec's from plastic surgery regarding wound care; given constant blockage and no much drainage from wound-vac, machine has been discontinued and waiting for assessment and further rec's from plastic surgery. -pharmacy following, monitoring and adjusting vancomycin level. -vanc target level 15-20 mcg/ml -will continue supportive care   IV drug abuse -cessation counseling provided -will continue Suboxone -patient seen by Dr. Daryll Drown and found to be a good candidate for suboxone clinic. -doing good overall and w/o acute signs of withdrawal  Chronic hep C -follow up with ID as an outpatient -needs 6 months sober and then can be a candidate for treatment. -LFT's has remained stable;  continue intermittent monitoring    COPD -good air movement bilaterally  -will continue PRN albuterol  -will need PFT's as an outpatient for classification and stratification  History of endocarditis -Last TEE on 09/02/2016 reported no vegetations. -patient has remained afebrile and denies CP or palpitations -will continue IV antibiotics.  Constipation/hemorroids  -stable/improved -will continue colace and miralax -continue anusol   DVT prophylaxis: Lovenox Code Status: Full code Family Communication: husband at bedside Disposition Plan: remains inpatient for now; continue IV antibiotics as dictated by ID; continue wound care by plastic surgery.   Consultants:   Infectious disease  Plastic surgery   Procedures:   See below for x-ray reports   Antimicrobials:  Vancomycin   Subjective: Afebrile, no CP, no SOB, no nausea, no vomiting. Reports improvement in BM's. Having some insomnia problems.  Objective: Vitals:   10/09/16 1506 10/09/16 2204 10/10/16 0606 10/10/16 1458  BP: (!) 111/55 (!) 107/58 90/65 108/60  Pulse: 72 70 82 74  Resp: 18 17 17 20   Temp: 97.8 F (36.6 C) 98.4 F (36.9 C) 98.2 F (36.8 C) 98 F (36.7 C)  TempSrc: Oral Oral Oral   SpO2: 92% 95% 100% 99%  Weight:      Height:        Intake/Output Summary (Last 24 hours) at 10/10/16 1615 Last data filed at 10/10/16 1300  Gross per 24 hour  Intake              880 ml  Output                0 ml  Net  880 ml   Filed Weights   09/24/16 2316 09/25/16 0957  Weight: 74.4 kg (164 lb) 78.2 kg (172 lb 4.8 oz)    General exam: no fever, no CP, no SOB. Patient reporting some insomnia overnight; otherwise feeling ok.  Respiratory system: good air movement, no wheezing, no crackles. Cardiac exam: no rubs, no gallops, positive SEM, no JVD. Central nervous system: AAOX3. No focal deficit, CN intact; MS 5/5 bilaterally.   Extremities: no edema, no cyanosis, no clubbing Skin: right upper  chest wound very small now, scant yellow discharge, no surrounding erythema. Psychiatry: no SI, no hallucinations, no agitation.   Data Reviewed: I have personally reviewed following labs and imaging studies  Basic Metabolic Panel:  Recent Labs Lab 10/06/16 0736 10/08/16 0908  NA 134* 134*  K 4.5 4.2  CL 103 104  CO2 24 23  GLUCOSE 120* 122*  BUN 17 16  CREATININE 0.86 0.87  CALCIUM 8.9 8.7*   GFR: Estimated Creatinine Clearance: 78.2 mL/min (by C-G formula based on SCr of 0.87 mg/dL).   Radiology Studies: No results found.  Scheduled Meds: . buprenorphine-naloxone  1 tablet Sublingual BID  . docusate sodium  100 mg Oral BID  . enoxaparin (LOVENOX) injection  40 mg Subcutaneous Q24H  . sodium chloride flush  3 mL Intravenous Q12H   Continuous Infusions: . sodium chloride 250 mL (10/02/16 1616)  . vancomycin Stopped (10/10/16 1040)     LOS: 15 days   Barton Dubois, MD [facility] 10/10/2016, 4:15 PM Pager: [phone]  If 7PM-7AM, please contact night-coverage [email or web address] Password [email or web address] 10/10/2016, 4:15 PM

## 2016-10-11 DIAGNOSIS — F1190 Opioid use, unspecified, uncomplicated: Secondary | ICD-10-CM

## 2016-10-11 LAB — CBC
HCT: 30.7 % — ABNORMAL LOW (ref 36.0–46.0)
Hemoglobin: 9.5 g/dL — ABNORMAL LOW (ref 12.0–15.0)
MCH: 24.2 pg — ABNORMAL LOW (ref 26.0–34.0)
MCHC: 30.9 g/dL (ref 30.0–36.0)
MCV: 78.3 fL (ref 78.0–100.0)
Platelets: 313 10*3/uL (ref 150–400)
RBC: 3.92 MIL/uL (ref 3.87–5.11)
RDW: 18.7 % — ABNORMAL HIGH (ref 11.5–15.5)
WBC: 9.8 10*3/uL (ref 4.0–10.5)

## 2016-10-11 LAB — BASIC METABOLIC PANEL
Anion gap: 5 (ref 5–15)
BUN: 21 mg/dL — ABNORMAL HIGH (ref 6–20)
CO2: 24 mmol/L (ref 22–32)
Calcium: 8.7 mg/dL — ABNORMAL LOW (ref 8.9–10.3)
Chloride: 106 mmol/L (ref 101–111)
Creatinine, Ser: 0.81 mg/dL (ref 0.44–1.00)
GFR calc Af Amer: >60 mL/min (ref >60)
GFR calc non Af Amer: >60 mL/min (ref >60)
Glucose, Bld: 93 mg/dL (ref 65–99)
Potassium: 3.9 mmol/L (ref 3.5–5.1)
Sodium: 135 mmol/L (ref 135–145)

## 2016-10-11 NOTE — Progress Notes (Signed)
PROGRESS NOTE    [name]  ZOX:[id number] DOB: Jun 01, 1970 DOA: 09/24/2016 PCP: Patient, [name]   Brief Narrative: [name] is a 46 y.o. female with a history of IV drug use, hepatitis C, septic arthritis, endocarditis, sternoclavicular abscess. She presents after leaving AMA for treatment of her abscess. She continues IV antibiotics at this moment.  Plan is for vancomycin for 6 more days and if stable, discharge on linezolid for 2 more weeks.  Assessment & Plan:   Principal Problem:   Septic arthritis of right sternoclavicular joint (HCC) Active Problems:   Polysubstance abuse   IV drug abuse   COPD (chronic obstructive pulmonary disease) (HCC)   Abscess of chest (HCC)   Pulmonary embolism, septic (HCC)   Endocarditis of tricuspid valve   Wound infection   Abscess of chest Septic arthritis of right sternoclavicular joint -Previous plans to treat with vancomycin for a total of 8 weeks. -Given history of patient leaving AMA she is not candidate for SNF placement -per ID recommendations will continue IV vancomycin for 6 more days and then discharge on linezolid for 2-4 more additional weeks (will clarify with ID service) -patient will need follow up with ID after discharge -remains stable, afebrile and in no distress. -will follow rec's from plastic surgery regarding wound care; now using Vaseline and dry dressing. Wound healing properly. -pharmacy following, monitoring and adjusting vancomycin level. -vanc target level 15-20 mcg/ml -will continue supportive care   IV drug abuse -cessation counseling provided -will continue Suboxone -patient seen by Dr. Daryll Drown and found to be a good candidate for suboxone clinic. -doing good overall and w/o acute signs of withdrawal  Chronic hep C -follow up with ID as an outpatient -needs 6 months sober and then can be a candidate for treatment. -LFT's has remained stable; continue intermittent monitoring    COPD -good air  movement bilaterally  -will continue PRN albuterol  -will need PFT's as an outpatient for classification and stratification  History of endocarditis -Last TEE on 09/02/2016 reported no vegetations. -patient has remained afebrile and denies CP or palpitations -will continue IV antibiotics.  Constipation/hemorroids  -stable/improved -will continue colace and miralax -continue anusol   DVT prophylaxis: Lovenox Code Status: Full code Family Communication: husband at bedside Disposition Plan: remains inpatient for now; continue IV antibiotics as dictated by ID; continue wound care by plastic surgery.   Consultants:   Infectious disease  Plastic surgery   Procedures:   See below for x-ray reports   Antimicrobials:  Vancomycin   Subjective: Afebrile, no CP, no SOB, no nausea, no vomiting. Denies acute withdrawal symptoms and/or any other complaints.   Objective: Vitals:   10/10/16 1458 10/10/16 2150 10/11/16 0625 10/11/16 1602  BP: 108/60 120/69 (!) 92/56 (!) 116/59  Pulse: 74 64 68 72  Resp: 20 17 17 18   Temp: 98 F (36.7 C) 98.2 F (36.8 C) 98 F (36.7 C)   TempSrc:   Oral   SpO2: 99% 100% 99% 100%  Weight:      Height:        Intake/Output Summary (Last 24 hours) at 10/11/16 1808 Last data filed at 10/11/16 0343  Gross per 24 hour  Intake              510 ml  Output                0 ml  Net  510 ml   Filed Weights   09/24/16 2316 09/25/16 0957  Weight: 74.4 kg (164 lb) 78.2 kg (172 lb 4.8 oz)   Patient's physical exam unchanged from examination and findings on 10/10/16.  General exam: no fever, no CP, no SOB. Patient reporting some insomnia overnight; otherwise feeling ok.  Respiratory system: good air movement, no wheezing, no crackles. Cardiac exam: no rubs, no gallops, positive SEM, no JVD. Central nervous system: AAOX3. No focal deficit, CN intact; MS 5/5 bilaterally.   Extremities: no edema, no cyanosis, no clubbing Skin: right upper  chest wound very small now, scant yellow discharge, no surrounding erythema. Psychiatry: no SI, no hallucinations, no agitation.   Data Reviewed: I have personally reviewed following labs and imaging studies  Basic Metabolic Panel:  Recent Labs Lab 10/06/16 0736 10/08/16 0908 10/11/16 0539  NA 134* 134* 135  K 4.5 4.2 3.9  CL 103 104 106  CO2 24 23 24   GLUCOSE 120* 122* 93  BUN 17 16 21*  CREATININE 0.86 0.87 0.81  CALCIUM 8.9 8.7* 8.7*   GFR: Estimated Creatinine Clearance: 84 mL/min (by C-G formula based on SCr of 0.81 mg/dL).   Radiology Studies: No results found.  Scheduled Meds: . buprenorphine-naloxone  1 tablet Sublingual BID  . docusate sodium  100 mg Oral BID  . enoxaparin (LOVENOX) injection  40 mg Subcutaneous Q24H  . sodium chloride flush  3 mL Intravenous Q12H   Continuous Infusions: . sodium chloride 250 mL (10/02/16 1616)  . vancomycin Stopped (10/11/16 1027)     LOS: 16 days   Barton Dubois, MD [facility] 10/11/2016, 6:08 PM Pager: [phone]  If 7PM-7AM, please contact night-coverage [email or web address] Password [facility] 10/11/2016, 6:08 PM

## 2016-10-11 NOTE — Progress Notes (Signed)
ANTIBIOTIC CONSULT NOTE   Pharmacy Consult for Vancomycin Indication: septic sternoclavicular joint  Allergies  Allergen Reactions  . Mango Flavor Hives and Swelling  . Doxycycline Other (See Comments)    "BURNS" >> ? INCREASED SENSITIVITY TO SUNLIGHT ?  . Pyridium [Phenazopyridine Hcl] Other (See Comments)    Irritation to skin  . Tramadol Nausea And Vomiting    Patient Measurements: Height: 5\' 1"  (154.9 cm) Weight: 172 lb 4.8 oz (78.2 kg) IBW/kg (Calculated) : 47.8 Adjusted Body Weight:   Vital Signs: Temp: 98 F (36.7 C) (07/02 0625) Temp Source: Oral (07/02 0625) BP: 92/56 (07/02 0625) Pulse Rate: 68 (07/02 0625) Intake/Output from previous day: 07/01 0701 - 07/02 0700 In: 761 [P.O.:570; IV Piggyback:300] Out: -  Intake/Output from this shift: No intake/output data recorded.  Labs:  Recent Labs  10/11/16 0539  WBC 9.8  HGB 9.5*  PLT 313  CREATININE 0.81   Estimated Creatinine Clearance: 84 mL/min (by C-G formula based on SCr of 0.81 mg/dL). No results for input(s): VANCOTROUGH, VANCOPEAK, VANCORANDOM, GENTTROUGH, GENTPEAK, GENTRANDOM, TOBRATROUGH, TOBRAPEAK, TOBRARND, AMIKACINPEAK, AMIKACINTROU, AMIKACIN in the last 72 hours.   Microbiology:   Medical History: Past Medical History:  Diagnosis Date  . Anxiety   . Chronic back pain   . Chronic knee pain   . COPD (chronic obstructive pulmonary disease) ([place])   . DDD (degenerative disc disease)   . Hepatitis C infection 07/13/2014  . IV drug abuse   . Nephrolithiasis   . Panic attacks   . Polysubstance abuse    cocaine, opiates, marijuana, amphetamines, "molly," Kashawn Dirr meth  . 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 *ID counting #, plan to keep her here til day #42 (10/16/16), then home on oral abx for 2 weeks (linezolid) = 7/21    Afb, WBC 10.0 (6/22)  5/22 vanc >> (7/7) -> then 2 weeks of oral abx (linezolid) 5/22 cefepime >> 5/30   5/26 VT: 12 on 1000mg  IV q12 5/30 VT 15 on 1250mg  IV q12 6/5  VT 12 (dose given late)- inc 1250 Q8 for calc'd VT ~18.  6/7 VT 22 on 1250mg  q8 >> decr 1g Q8 6/19 VT = 22 on 1g q8>>decr to 750mg  q8h 6/22 VT = 18 on 750 Q8H 6/29 VT = 11 (2 hrs late) on 750 q8h - expect true trough is at goal  5/22 MRSA PCR: negative 5/22 Blood x 2 - negative 5/24 sternoclavicular abscess: few MRSA, (MIC to Vanc = 1), rare CoNS 5/24 fungal cx: no fungus observed 6/16 Blood x 2 - negative  Goal of Therapy:  Vancomycin trough level 15-20 mcg/ml  Plan:  Continue vanc 750mg  IV Q8H through 10/16/16.   Nimra Puccinelli S. Alford Highland, PharmD, BCPS Clinical Staff Pharmacist Pager [phone]  Eilene Ghazi Stillinger 10/11/2016,9:10 AM

## 2016-10-11 NOTE — Progress Notes (Signed)
[facility] for Infectious Disease  Date of Admission:  09/24/2016    Total days of antibiotics. 37         Vancomycin Day. 37         Cefepime (5/22-5/30)         ASSESSMENT: Patient was feeling better. She has no complaints and denies any withdrawal symptoms on Suboxone. Her intention is to stay sober and to follow-up with IM clinic both for her primary care and Suboxone needs. Her recent wound culture was positive for MRSA, treated with vancomycin with intention to complete 8 weeks. Started on 08/31/2016, with a disruption of 5 days.  Wound VAC was removed 2 days ago. Well-healing wound. Patient remained afebrile, no leukocytosis. Renal functions stable.  According to patient she has no problems with affording and is alert at home.  PLAN: 1. Continue vancomycin for 5 more days still 10/16/2016. 2. She can be discharged home on 2 weeks of linezolid-stop date 10/30/16.  ID will sign off at this time. She will need to follow up with Dr. Baxter Flattery at [facility] after completing her antibiotics.  IV drug abuse. Patient with follow-up with Dr. Daryll Drown at IM clinic for Suboxone. Denies any withdrawal symptoms. -Continue Suboxone.  Chronic hep C. Willie to follow-up in ID outpatient clinic-once she is sober for 6 month.  Principal Problem:   Abscess of chest [facility]) Active Problems:   Polysubstance abuse   IV drug abuse   COPD (chronic obstructive pulmonary disease) (HCC)   Pulmonary embolism, septic (HCC)   Septic arthritis of right sternoclavicular joint (HCC)   Endocarditis of tricuspid valve   Wound infection   . buprenorphine-naloxone  1 tablet Sublingual BID  . docusate sodium  100 mg Oral BID  . enoxaparin (LOVENOX) injection  40 mg Subcutaneous Q24H  . sodium chloride flush  3 mL Intravenous Q12H    SUBJECTIVE: Patient was feeling better today. No complaints. She wants to stay sober and follow-up with IM clinic for primary care and Suboxone  needs.  Review of Systems:  ROS  Allergies  Allergen Reactions  . Mango Flavor Hives and Swelling  . Doxycycline Other (See Comments)    "BURNS" >> ? INCREASED SENSITIVITY TO SUNLIGHT ?  . Pyridium [Phenazopyridine Hcl] Other (See Comments)    Irritation to skin  . Tramadol Nausea And Vomiting    OBJECTIVE: Vitals:   10/10/16 0606 10/10/16 1458 10/10/16 2150 10/11/16 0625  BP: 90/65 108/60 120/69 (!) 92/56  Pulse: 82 74 64 68  Resp: 17 20 17 17   Temp: 98.2 F (36.8 C) 98 F (36.7 C) 98.2 F (36.8 C) 98 F (36.7 C)  TempSrc: Oral   Oral  SpO2: 100% 99% 100% 99%  Weight:      Height:       Body mass index is 32.56 kg/m.  Physical Exam   Vitals:   10/10/16 0606 10/10/16 1458 10/10/16 2150 10/11/16 0625  BP: 90/65 108/60 120/69 (!) 92/56  Pulse: 82 74 64 68  Resp: 17 20 17 17   Temp: 98.2 F (36.8 C) 98 F (36.7 C) 98.2 F (36.8 C) 98 F (36.7 C)  TempSrc: Oral   Oral  SpO2: 100% 99% 100% 99%  Weight:      Height:       General: Vital signs reviewed.  Patient is well-developed and well-nourished, in no acute distress and cooperative with exam.  Cardiovascular: RRR, S1 normal, S2 normal, no murmurs,  gallops, or rubs. Pulmonary/Chest:Well-healing wound at right sternoclavicular joint area, no drainage. Clear to auscultation bilaterally, no wheezes, rales, or rhonchi. Abdominal: Soft, non-tender, non-distended, BS +, no masses, organomegaly, or guarding present.  Extremities: No lower extremity edema bilaterally,  pulses symmetric and intact bilaterally. No cyanosis or clubbing.   Lab Results Lab Results  Component Value Date   WBC 9.8 10/11/2016   HGB 9.5 (L) 10/11/2016   HCT 30.7 (L) 10/11/2016   MCV 78.3 10/11/2016   PLT 313 10/11/2016    Lab Results  Component Value Date   CREATININE 0.81 10/11/2016   BUN 21 (H) 10/11/2016   NA 135 10/11/2016   K 3.9 10/11/2016   CL 106 10/11/2016   CO2 24 10/11/2016    Lab Results  Component Value Date   ALT  22 10/01/2016   AST 23 10/01/2016   ALKPHOS 67 10/01/2016   BILITOT 0.5 10/01/2016     Microbiology: No results found for this or any previous visit (from the past 240 hour(s)).  Lorella Nimrod, MD PGY2 [facility] for Infectious [place] [phone] pager   [phone] cell 10/11/2016, 3:11 PM

## 2016-10-12 NOTE — Consult Note (Signed)
Reason for Consult:sternal clavicular wound Referring Physician: Dr. Barton Dubois  [name] is an 46 y.o. female.  HPI: The patient is a 46 yrs old wf here for IV antibiotic treatment for her chest wound.  The area was debrided and Acell / VAC placed.  She is doing very well.  No sign of continued infection.  The wound is filled in and only 5 mm in size superficially.  No redness or drainage.  This is great progress.  Past Medical History:  Diagnosis Date  . Anxiety   . Chronic back pain   . Chronic knee pain   . COPD (chronic obstructive pulmonary disease) ([place])   . DDD (degenerative disc disease)   . Hepatitis C infection 07/13/2014  . IV drug abuse   . Nephrolithiasis   . Panic attacks   . Polysubstance abuse    cocaine, opiates, marijuana, amphetamines, "molly," crystal meth  . Pulmonary embolism [facility])     Past Surgical History:  Procedure Laterality Date  . APPLICATION OF A-CELL OF CHEST/ABDOMEN Right 09/07/2016   Procedure: APPLICATION OF A-CELL OF CHEST/ABDOMEN;  Surgeon: Wallace Going, DO;  Location: [facility];  Service: Plastics;  Laterality: Right;  . CESAREAN SECTION    . I&D EXTREMITY Right 09/02/2016   Procedure: IRRIGATION AND DEBRIDEMENT OF RIGHT STERNOCLAVICULAR JOINT, STERNUM AND RIBS;  Surgeon: Gaye Pollack, MD;  Location: [facility] OR;  Service: Vascular;  Laterality: Right;  . INCISION AND DRAINAGE OF WOUND Right 09/07/2016   Procedure: IRRIGATION AND DEBRIDEMENT OF CHEST WOUND, PLACEMENT OF A CELL AND VAC;  Surgeon: Wallace Going, DO;  Location: [facility];  Service: Plastics;  Laterality: Right;  . NOSE SURGERY    . STERNAL WOUND DEBRIDEMENT N/A 02/22/2016   Procedure: IRRIGATION AND DEBRIDEMENT MANUBRIUM ABSCESS with wound cultures;  Surgeon: Melrose Nakayama, MD;  Location: [facility];  Service: Thoracic;  Laterality: N/A;  . STERNAL WOUND DEBRIDEMENT N/A 02/23/2016   Procedure: DRESSING CHANGE UNDER ANESTHESIA  WITH WOUND VAC PLACEMENT;  Surgeon: Melrose Nakayama, MD;  Location: [facility];  Service: Thoracic;  Laterality: N/A;  . TEE WITHOUT CARDIOVERSION N/A 02/23/2016   Procedure: TRANSESOPHAGEAL ECHOCARDIOGRAM (TEE);  Surgeon: Jerline Pain, MD;  Location[facility] ENDOSCOPY;  Service: Cardiovascular;  Laterality: N/A;  . TUBAL LIGATION     "tied, burned, clipped"    Family History  Problem Relation Age of Onset  . COPD Mother   . Hypertension Father   . Heart attack Father   . Diabetes Father   . Arthritis Unknown   . Lung disease Unknown   . Cancer Unknown   . Asthma Unknown     Social History:  reports that she has been smoking Cigarettes.  She has a 32.00 pack-year smoking history. She has never used smokeless tobacco. She reports that she uses drugs, including IV, Cocaine, Marijuana, Methamphetamines, Heroin, and Other-see comments. She reports that she does not drink alcohol.  Allergies:  Allergies  Allergen Reactions  . Mango Flavor Hives and Swelling  . Doxycycline Other (See Comments)    "BURNS" >> ? INCREASED SENSITIVITY TO SUNLIGHT ?  . Pyridium [Phenazopyridine Hcl] Other (See Comments)    Irritation to skin  . Tramadol Nausea And Vomiting    Medications: I have reviewed the patient's current medications.  Results for orders placed or performed during the hospital encounter of 09/24/16 (from the past 48 hour(s))  Basic metabolic panel     Status: Abnormal   Collection Time:  10/11/16  5:39 AM  Result Value Ref Range   Sodium 135 135 - 145 mmol/L   Potassium 3.9 3.5 - 5.1 mmol/L   Chloride 106 101 - 111 mmol/L   CO2 24 22 - 32 mmol/L   Glucose, Bld 93 65 - 99 mg/dL   BUN 21 (H) 6 - 20 mg/dL   Creatinine, Ser 0.81 0.44 - 1.00 mg/dL   Calcium 8.7 (L) 8.9 - 10.3 mg/dL   GFR calc non Af Amer >60 >60 mL/min   GFR calc Af Amer >60 >60 mL/min    Comment: (NOTE) The eGFR has been calculated using the CKD EPI equation. This calculation has not been validated in all clinical situations. eGFR's persistently <60 mL/min  signify possible Chronic Kidney Disease.    Anion gap 5 5 - 15  CBC     Status: Abnormal   Collection Time: 10/11/16  5:39 AM  Result Value Ref Range   WBC 9.8 4.0 - 10.5 K/uL   RBC 3.92 3.87 - 5.11 MIL/uL   Hemoglobin 9.5 (L) 12.0 - 15.0 g/dL   HCT 30.7 (L) 36.0 - 46.0 %   MCV 78.3 78.0 - 100.0 fL   MCH 24.2 (L) 26.0 - 34.0 pg   MCHC 30.9 30.0 - 36.0 g/dL   RDW 18.7 (H) 11.5 - 15.5 %   Platelets 313 150 - 400 K/uL    No results found.  Review of Systems  Constitutional: Negative.   HENT: Negative.   Eyes: Negative.   Respiratory: Negative.   Cardiovascular: Negative.   Gastrointestinal: Negative.   Genitourinary: Negative.   Musculoskeletal: Negative.   Skin: Negative.   Neurological: Negative.   Psychiatric/Behavioral: Negative.    Blood pressure 112/61, pulse 69, temperature 98.4 F (36.9 C), resp. rate 18, height _0  (1.549 m), weight 78.2 kg (172 lb 4.8 oz), SpO2 99 %. Physical Exam  Constitutional: She is oriented to person, place, and time. She appears well-developed and well-nourished.  HENT:  Head: Normocephalic and atraumatic.  Eyes: EOM are normal. Pupils are equal, round, and reactive to light.  Cardiovascular: Normal rate.   Respiratory: Effort normal. No respiratory distress.  Neurological: She is alert and oriented to person, place, and time.  Skin: Skin is warm. No rash noted. No erythema. No pallor.  Psychiatric: She has a normal mood and affect. Her behavior is normal. Judgment and thought content normal.    Assessment/Plan: Vaseline to the area daily.  May shower and get it wet.  Would like to see the patient in follow up in the office one week after discharge.  Wallace Going 10/12/2016, 11:50 AM

## 2016-10-12 NOTE — Progress Notes (Signed)
PROGRESS NOTE    [name]  RXV:[id number] DOB: [birth year] DOA: 09/24/2016 PCP: Patient, [name]   Brief Narrative: [name] is a 46 y.o. female with a history of IV drug use, hepatitis C, septic arthritis, endocarditis, sternoclavicular abscess. She presents after leaving AMA for treatment of her abscess. She continues IV antibiotics at this moment.  Plan is for vancomycin for 5 more days and if stable, discharge on linezolid for 2 more weeks.  Assessment & Plan:   Principal Problem:   Septic arthritis of right sternoclavicular joint (HCC) Active Problems:   Polysubstance abuse   IV drug abuse   COPD (chronic obstructive pulmonary disease) (HCC)   Abscess of chest (HCC)   Pulmonary embolism, septic (HCC)   Endocarditis of tricuspid valve   Wound infection   Abscess of chest Septic arthritis of right sternoclavicular joint -Previous plans to treat with vancomycin for a total of 8 weeks. -Given history of patient leaving AMA she is not candidate for SNF placement -per ID recommendations will continue IV vancomycin for 5 more days (including today; last day of vancomycin 10/16/16) and then discharge on linezolid for 2 more additional weeks (as recommended by ID, discussed and clarified with Dr. Megan Salon) -patient will need follow up with ID after discharge; appointment will be arranged for her. -patient remains stable and in no distress. -will follow rec's from plastic surgery regarding wound care; now using Vaseline and dry dressing. Wound healing properly and w/o surrounding erythema.  -pharmacy following, monitoring and adjusting vancomycin level. -Vancomycin target level 15-20 mcg/ml -Will continue supportive care   IV drug abuse -Cessation counseling once again provided and encouraged -will continue Suboxone  -patient seen by Dr. Daryll Drown and found to be a good candidate for suboxone clinic. -doing good overall and w/o acute signs of withdrawal -will received  suboxone prescription from IM suboxone clinic and appointment follow up details  Chronic hep C -follow up with ID as an outpatient -needs 6 months sober and then can be a candidate for treatment. -LFT's has remained stable; continue intermittent monitoring    COPD -good air movement bilaterally  -will continue PRN albuterol  -will need PFT's as an outpatient for classification and stratification  History of endocarditis -Last TEE on 09/02/2016 reported no vegetations. -patient has remained afebrile and denies CP or palpitations -will continue IV antibiotics.  Constipation/hemorroids  -stable/improved -will continue colace and miralax -continue anusol   DVT prophylaxis: Lovenox Code Status: Full code Family Communication: husband at bedside Disposition Plan: remains inpatient for now; continue IV antibiotics as dictated by ID; continue wound care by plastic surgery.   Consultants:   Infectious disease  Plastic surgery   Procedures:   See below for x-ray reports   Antimicrobials:  Vancomycin (with plans to continue until 10/16/16)  Linezolid to be prescribed at discharge; with plans for 2 weeks of treatment   Subjective: Afebrile, no CP, no SOB. No nausea, no vomiting. Patient is stable and in no distress.   Objective: Vitals:   10/11/16 0625 10/11/16 1602 10/11/16 2142 10/12/16 0458  BP: (!) 92/56 (!) 116/59 117/74 112/61  Pulse: 68 72 81 69  Resp: 17 18 18 18   Temp: 98 F (36.7 C)  98.4 F (36.9 C) 98.4 F (36.9 C)  TempSrc: Oral     SpO2: 99% 100% 100% 99%  Weight:      Height:        Intake/Output Summary (Last 24 hours) at 10/12/16 1320 Last data  filed at 10/12/16 1128  Gross per 24 hour  Intake             1140 ml  Output                0 ml  Net             1140 ml   Filed Weights   09/24/16 2316 09/25/16 0957  Weight: 74.4 kg (164 lb) 78.2 kg (172 lb 4.8 oz)   General exam: afebrile, in no distress, denies CP and SOB. Patient is feeling ok  and endorses no withdrawal symptoms.   Respiratory system: good air movement, no wheezing, no crackles Cardiac exam: positive SEM, no rubs, no gallops and no JVD appreciated.  Central nervous system: AAOX3, no focal neurologic deficit. MS 5/5 bilaterally and symmetrically. CN grossly intact on exam.    Extremities: no edema, no cyanosis, no clubbing  Skin: right upper chest wound very small and significantly improved after use of Wound Vac. Very scan yellow slough appreciated, no surrounding erythema, minimal pain reported by patient.  Psychiatry: no SI and no hallucinations   Data Reviewed: I have personally reviewed following labs and imaging studies  Basic Metabolic Panel:  Recent Labs Lab 10/06/16 0736 10/08/16 0908 10/11/16 0539  NA 134* 134* 135  K 4.5 4.2 3.9  CL 103 104 106  CO2 24 23 24   GLUCOSE 120* 122* 93  BUN 17 16 21*  CREATININE 0.86 0.87 0.81  CALCIUM 8.9 8.7* 8.7*   GFR: Estimated Creatinine Clearance: 84 mL/min (by C-G formula based on SCr of 0.81 mg/dL).   Radiology Studies: No results found.  Scheduled Meds: . buprenorphine-naloxone  1 tablet Sublingual BID  . docusate sodium  100 mg Oral BID  . enoxaparin (LOVENOX) injection  40 mg Subcutaneous Q24H  . sodium chloride flush  3 mL Intravenous Q12H   Continuous Infusions: . sodium chloride 250 mL (10/02/16 1616)  . vancomycin Stopped (10/12/16 1128)     LOS: 17 days   Barton Dubois, MD [facility] 10/12/2016, 1:20 PM Pager: [phone]  If 7PM-7AM, please contact night-coverage [email or web address] Password [email or web address] 10/12/2016, 1:20 PM

## 2016-10-13 NOTE — Progress Notes (Signed)
Patient ID: [name], female   DOB: 12-23-1970, 46 y.o.   MRN: [id number]                                                                PROGRESS NOTE                                                                                                                                                                                                             Patient Demographics:    [name], is a 46 y.o. female, DOB - 1970-08-03, [id number]  Admit date - 09/24/2016   Admitting Physician Phillips Grout, MD  Outpatient Primary MD for the patient is Patient, [name]  LOS - 18  Outpatient Specialists:    Chief Complaint  Patient presents with  . Fever       Brief Narrative    [name] is a 46 y.o. female with a history of IV drug use, hepatitis C, septic arthritis, endocarditis, sternoclavicular abscess. She presents after leaving AMA for treatment of her abscess. She continues IV antibiotics at this moment.  Plan is for vancomycin for 5 more days and if stable, discharge on linezolid for 2 more weeks.   Subjective:    [name] today has minimal yellow drainage from sternal chest wound.  Afebrile.  Tolerating abx.  , No headache, No chest pain, No abdominal pain - No Nausea, No new weakness tingling or numbness, No Cough - SOB.    Assessment  & Plan :    Principal Problem:   Septic arthritis of right sternoclavicular joint (HCC) Active Problems:   Polysubstance abuse   IV drug abuse   COPD (chronic obstructive pulmonary disease) (HCC)   Abscess of chest (HCC)   Pulmonary embolism, septic (HCC)   Endocarditis of tricuspid valve   Wound infection   Abscess of chest Septic arthritis of right sternoclavicular joint -Previous plans to treat with vancomycin for a total of 8 weeks. -Given history of patient leaving AMA she is not candidate for SNF placement -per ID recommendations will continue IV vancomycin for 5 more days (including today; last day of vancomycin  10/16/16) and then discharge on linezolid for 2 more additional weeks (as recommended by ID, discussed and clarified with Dr. Megan Salon) -patient will need  follow up with ID after discharge; appointment will be arranged for her. -patient remains stable and in no distress. -will follow rec's from plastic surgery regarding wound care; now using Vaseline and dry dressing. Wound healing properly and w/o surrounding erythema.  -pharmacy following, monitoring and adjusting vancomycin level. -Vancomycin target level 15-20 mcg/ml -Will continue supportive care   IV drug abuse -Cessation counseling once again provided and encouraged -will continue Suboxone  -patient seen by Dr. Daryll Drown and found to be a good candidate for suboxone clinic. -doing good overall and w/o acute signs of withdrawal -will received suboxone prescription from IM suboxone clinic and appointment follow up details  Chronic hep C -follow up with ID as an outpatient -needs 6 months sober and then can be a candidate for treatment. -LFT's has remained stable; continue intermittent monitoring    COPD -good air movement bilaterally  -will continue PRN albuterol  -will need PFT's as an outpatient for classification and stratification  History of endocarditis -Last TEE on 09/02/2016 reported no vegetations. -patient has remained afebrile and denies CP or palpitations -will continue IV antibiotics.  Constipation/hemorroids  -stable/improved -will continue colace and miralax -continue anusol   DVT prophylaxis: Lovenox Code Status: Full code Family Communication: w patient Disposition Plan: remains inpatient for now; continue IV antibiotics as dictated by ID; continue wound care by plastic surgery.   Consultants:   Infectious disease  Plastic surgery   Procedures:   See below for x-ray reports   Antimicrobials:  Vancomycin (with plans to continue until 10/16/16)  Linezolid to be prescribed at discharge;  with plans for 2 weeks of treatment      Lab Results  Component Value Date   PLT 313 10/11/2016      Anti-infectives    Start     Dose/Rate Route Frequency Ordered Stop   09/28/16 1500  vancomycin (VANCOCIN) IVPB 750 mg/150 ml premix     750 mg 150 mL/hr over 60 Minutes Intravenous Every 8 hours 09/28/16 1403     09/25/16 1000  vancomycin (VANCOCIN) IVPB 1000 mg/200 mL premix  Status:  Discontinued     1,000 mg 200 mL/hr over 60 Minutes Intravenous Every 8 hours 09/25/16 0424 09/28/16 1403   09/25/16 0130  vancomycin (VANCOCIN) IVPB 1000 mg/200 mL premix     1,000 mg 200 mL/hr over 60 Minutes Intravenous  Once 09/25/16 0119 09/25/16 0331        Objective:   Vitals:   10/12/16 0458 10/12/16 1425 10/12/16 2131 10/13/16 0542  BP: 112/61 (!) 128/94 131/78 126/75  Pulse: 69 100 94 (!) 113  Resp: 18 18 18 20   Temp: 98.4 F (36.9 C) 98.6 F (37 C) (!) 97.5 F (36.4 C) 98.3 F (36.8 C)  TempSrc:  Oral    SpO2: 99% 99% 100% 95%  Weight:      Height:        Wt Readings from Last 3 Encounters:  09/25/16 78.2 kg (172 lb 4.8 oz)  08/31/16 74.6 kg (164 lb 6.4 oz)  03/17/16 85.3 kg (188 lb)     Intake/Output Summary (Last 24 hours) at 10/13/16 1158 Last data filed at 10/13/16 0700  Gross per 24 hour  Intake              870 ml  Output                0 ml  Net              870 ml  Physical Exam  Awake Alert, Oriented X 3, No new F.N deficits, Normal affect [place].AT,PERRAL Supple Neck,No JVD, No cervical lymphadenopathy appriciated.  Symmetrical Chest wall movement, Good air movement bilaterally, CTAB RRR,No Gallops,Rubs or new Murmurs, No Parasternal Heave +ve B.Sounds, Abd Soft, No tenderness, No organomegaly appriciated, No rebound - guarding or rigidity. No Cyanosis, Clubbing or edema, no janeways, no osler, no splinter Yellow drainage from right sternal chest wound.  minimal    Data Review:    CBC  Recent Labs Lab 10/11/16 0539  WBC 9.8  HGB 9.5*    HCT 30.7*  PLT 313  MCV 78.3  MCH 24.2*  MCHC 30.9  RDW 18.7*    Chemistries   Recent Labs Lab 10/08/16 0908 10/11/16 0539  NA 134* 135  K 4.2 3.9  CL 104 106  CO2 23 24  GLUCOSE 122* 93  BUN 16 21*  CREATININE 0.87 0.81  CALCIUM 8.7* 8.7*   ------------------------------------------------------------------------------------------------------------------ No results for input(s): CHOL, HDL, LDLCALC, TRIG, CHOLHDL, LDLDIRECT in the last 72 hours.  Lab Results  Component Value Date   HGBA1C 5.8 (H) 10/01/2014   ------------------------------------------------------------------------------------------------------------------ No results for input(s): TSH, T4TOTAL, T3FREE, THYROIDAB in the last 72 hours.  Invalid input(s): FREET3 ------------------------------------------------------------------------------------------------------------------ No results for input(s): VITAMINB12, FOLATE, FERRITIN, TIBC, IRON, RETICCTPCT in the last 72 hours.  Coagulation profile No results for input(s): INR, PROTIME in the last 168 hours.  No results for input(s): DDIMER in the last 72 hours.  Cardiac Enzymes No results for input(s): CKMB, TROPONINI, MYOGLOBIN in the last 168 hours.  Invalid input(s): CK ------------------------------------------------------------------------------------------------------------------ No results found for: BNP  Inpatient Medications  Scheduled Meds: . buprenorphine-naloxone  1 tablet Sublingual BID  . docusate sodium  100 mg Oral BID  . enoxaparin (LOVENOX) injection  40 mg Subcutaneous Q24H  . sodium chloride flush  3 mL Intravenous Q12H   Continuous Infusions: . sodium chloride 250 mL (10/02/16 1616)  . vancomycin 750 mg (10/13/16 1110)   PRN Meds:.sodium chloride, acetaminophen **OR** acetaminophen, hydrOXYzine, MUSCLE RUB, polyethylene glycol, sodium chloride flush, witch hazel-glycerin  Micro Results No results found for this or any  previous visit (from the past 240 hour(s)).  Radiology Reports Ct Chest W Contrast  Result Date: 09/25/2016 CLINICAL DATA:  Right sternoclavicular joint infection. EXAM: CT CHEST WITH CONTRAST TECHNIQUE: Multidetector CT imaging of the chest was performed during intravenous contrast administration. CONTRAST:  141mL ISOVUE-300 IOPAMIDOL (ISOVUE-300) INJECTION 61% COMPARISON:  Chest CT 08/31/2016 FINDINGS: Cardiovascular: Normal appearance of the heart and aorta. The proximal arch vessels are patent. Mediastinum/Nodes: Unchanged appearance of mildly asymmetrically enlarged right axillary lymph nodes. The visualized thyroid and thoracic esophageal course are unremarkable. Lungs/Pleura: Focal area of atelectasis in the right middle lobe is unchanged. Upper Abdomen: The visualized portions of the upper abdominal organs are normal. Musculoskeletal: Intermediate attenuation collection at the right sternoclavicular first costo manubrial joints per cysts, having decreased in size from 8.5 by 9.6 cm compared to 6.8 x 6.5 cm on the current examination. Extensive osteolysis of the clavicle, sternum and right first rib are unchanged. Previously seen inflammatory findings in the right breast have greatly decreased. IMPRESSION: 1. Decreased size of phlegmonous area of centered at the right sternoclavicular and first costal manubrial joints associated osteolysis. 2. Unchanged appearance of prominent right axillary lymph nodes that are likely reactive. 3. Decreased inflammatory change of the right breast. Electronically Signed   By: Ulyses Jarred M.D.   On: 09/25/2016 04:12    Time Spent in minutes  Speers M.D on 10/13/2016 at 11:58 AM  Between 7am to 7pm - Pager - [phone]  After 7pm go to [email or web address] - password [facility]  [facility] -  Office  [phone]

## 2016-10-14 ENCOUNTER — Inpatient Hospital Stay (HOSPITAL_COMMUNITY): Payer: Medicare Other

## 2016-10-14 LAB — URINALYSIS, ROUTINE W REFLEX MICROSCOPIC
Bacteria, UA: NONE SEEN
Bilirubin Urine: NEGATIVE
Glucose, UA: >=500 mg/dL — AB
Hgb urine dipstick: NEGATIVE
Ketones, ur: NEGATIVE mg/dL
Leukocytes, UA: NEGATIVE
Nitrite: NEGATIVE
Protein, ur: NEGATIVE mg/dL
Specific Gravity, Urine: 1.032 — ABNORMAL HIGH (ref 1.005–1.030)
Squamous Epithelial / LPF: NONE SEEN
WBC, UA: NONE SEEN WBC/hpf (ref 0–5)
pH: 6 (ref 5.0–8.0)

## 2016-10-14 LAB — BASIC METABOLIC PANEL
Anion gap: 7 (ref 5–15)
BUN: 19 mg/dL (ref 6–20)
CO2: 23 mmol/L (ref 22–32)
Calcium: 8.4 mg/dL — ABNORMAL LOW (ref 8.9–10.3)
Chloride: 102 mmol/L (ref 101–111)
Creatinine, Ser: 0.93 mg/dL (ref 0.44–1.00)
GFR calc Af Amer: >60 mL/min (ref >60)
GFR calc non Af Amer: >60 mL/min (ref >60)
Glucose, Bld: 152 mg/dL — ABNORMAL HIGH (ref 65–99)
Potassium: 3.5 mmol/L (ref 3.5–5.1)
Sodium: 132 mmol/L — ABNORMAL LOW (ref 135–145)

## 2016-10-14 MED ORDER — SODIUM CHLORIDE 0.9% FLUSH: 10.0000 mL | INTRAVENOUS | Status: DC | PRN

## 2016-10-14 NOTE — Progress Notes (Signed)
At 1900 shift change, day RN spoke w/ provider & placed a verbal order for a PICC line so pt can receive IV abx. In shift report, I was notified that multiple IV team members attempted PIVs, US guided and midlines with no success. PICC still not placed at this time.

## 2016-10-14 NOTE — Progress Notes (Signed)
Peripherally Inserted Central Catheter/Midline Placement  The IV Nurse has discussed with the patient and/or persons authorized to consent for the patient, the purpose of this procedure and the potential benefits and risks involved with this procedure.  The benefits include less needle sticks, lab draws from the catheter, and the patient may be discharged home with the catheter. Risks include, but not limited to, infection, bleeding, blood clot (thrombus formation), and puncture of an artery; nerve damage and irregular heartbeat and possibility to perform a PICC exchange if needed/ordered by physician.  Alternatives to this procedure were also discussed.  Bard Power PICC patient education guide, fact sheet on infection prevention and patient information card has been provided to patient /or left at bedside.    PICC/Midline Placement Documentation  PICC Single Lumen 15/94/58 PICC Left Basilic 39 cm 1 cm (Active)  Indication for Insertion or Continuance of Line Poor Vasculature-patient has had multiple peripheral attempts or PIVs lasting less than 24 hours 10/14/2016  9:00 AM  Exposed Catheter (cm) 1 cm 10/14/2016  9:00 AM  Dressing Change Due 10/21/16 10/14/2016  9:00 AM       [name] 10/14/2016, 9:16 AM

## 2016-10-14 NOTE — Progress Notes (Signed)
Patient ID: [name], female   DOB: 12/08/1970, 46 y.o.   MRN: [id number]                                                                PROGRESS NOTE                                                                                                                                                                                                             Patient Demographics:    [name], is a 46 y.o. female, DOB - 07/17/1970, [id number]  Admit date - 09/24/2016   Admitting Physician Phillips Grout, MD  Outpatient Primary MD for the patient is Patient, [name]  LOS - 19  Outpatient Specialists:  Chief Complaint  Patient presents with  . Fever       Brief Narrative  Brief Narrative    [name] a 46 y.o.female with a history of IV drug use, hepatitis C, septic arthritis, endocarditis, sternoclavicular abscess. She presents after leaving AMA for treatment of her abscess. She continues IV antibiotics at this moment.  Plan is for vancomycin for 67moe days and if stable, discharge on linezolid for 2 more weeks.   Subjective:    [name] had lost her IV access last nite.  Pt febrile this am.   Pt missed 2 doses of vanco.   No headache, No chest pain, No abdominal pain - No Nausea, No new weakness tingling or numbness, No Cough - SOB.    Assessment  & Plan :    Principal Problem:   Septic arthritis of right sternoclavicular joint (HCC) Active Problems:   Polysubstance abuse   IV drug abuse   COPD (chronic obstructive pulmonary disease) (HCC)   Abscess of chest (HCC)   Pulmonary embolism, septic (HCC)   Endocarditis of tricuspid valve   Wound infection   Fever Check ESR Blood culture x2.  CXR, ua  Resume iv abx.   If ESR elevated then will consider cardiac echo  Abscess of chest Septic arthritis of right sternoclavicular joint -Previous plans to treat with vancomycin for a total of 8 weeks. -Given history of patient leaving AMA she is not  candidate for SNF placement -per ID recommendations will continue IV vancomycin for 536mo days (including today;  last day of vancomycin 10/16/16)and then discharge on linezolid for 2 more additional weeks (as recommended by ID, discussed and clarified with Dr. Megan Salon) -patient will need follow up with ID after discharge; appointment will be arranged for her. -patient remains stable and in no distress. -will follow rec's from plastic surgery regarding wound care; now using Vaseline and dry dressing. Wound healing properly and w/o surrounding erythema.  -pharmacy following, monitoring and adjusting vancomycin level. -Vancomycin target level 15-20 mcg/ml -Will continue supportive care    IV drug abuse -Cessation counseling once again provided and encouraged -will continue Suboxone  -patient seen by Dr. Daryll Drown and found to be a good candidate for suboxone clinic. -doing good overall and w/o acute signs of withdrawal -will received suboxone prescription from IM suboxone clinic and appointment follow up details  Chronic hep C -follow up with ID as an outpatient -needs 6 months sober and then can be a candidate for treatment. -LFT's has remained stable; continue intermittent monitoring   COPD -good air movement bilaterally  -will continue PRN albuterol  -will need PFT's as an outpatient for classification and stratification  History of endocarditis -Last TEE on 09/02/2016 reported no vegetations. -patient has remained afebrile and denies CP or palpitations -will continue IV antibiotics.  Constipation/hemorroids  -stable/improved -will continue colace and miralax -continue anusol   DVT prophylaxis:Lovenox Code Status:Full code Family Communication:w patient Disposition Plan:remains inpatient for now; continue IV antibiotics as dictated by ID; continue wound care by plastic surgery.   Consultants:  Infectious disease  Plastic surgery   Procedures:  See  below for x-ray reports   Antimicrobials:  Vancomycin (with plans to continue until 10/16/16)  Linezolid to be prescribed at discharge; with plans for 2 weeks of treatment      Lab Results  Component Value Date   PLT 313 10/11/2016    Anti-infectives    Start     Dose/Rate Route Frequency Ordered Stop   09/28/16 1500  vancomycin (VANCOCIN) IVPB 750 mg/150 ml premix     750 mg 150 mL/hr over 60 Minutes Intravenous Every 8 hours 09/28/16 1403     09/25/16 1000  vancomycin (VANCOCIN) IVPB 1000 mg/200 mL premix  Status:  Discontinued     1,000 mg 200 mL/hr over 60 Minutes Intravenous Every 8 hours 09/25/16 0424 09/28/16 1403   09/25/16 0130  vancomycin (VANCOCIN) IVPB 1000 mg/200 mL premix     1,000 mg 200 mL/hr over 60 Minutes Intravenous  Once 09/25/16 0119 09/25/16 0331        Objective:   Vitals:   10/13/16 2125 10/14/16 0535 10/14/16 0557 10/14/16 0658  BP: 127/74 124/60 107/69   Pulse: (!) 101  (!) 117   Resp: _0 Temp: 98.3 F (36.8 C) (!) 102 F (38.9 C) (!) 102.6 F (39.2 C) (!) 100.5 F (38.1 C)  TempSrc: Oral Oral Oral Oral  SpO2: 95% 98% 96%   Weight:      Height:        Wt Readings from Last 3 Encounters:  09/25/16 78.2 kg (172 lb 4.8 oz)  08/31/16 74.6 kg (164 lb 6.4 oz)  03/17/16 85.3 kg (188 lb)     Intake/Output Summary (Last 24 hours) at 10/14/16 0824 Last data filed at 10/14/16 0537  Gross per 24 hour  Intake              380 ml  Output  0 ml  Net              380 ml     Physical Exam  Awake Alert, Oriented X 3, No new F.N deficits, Normal affect [place].AT,PERRAL Supple Neck,No JVD, No cervical lymphadenopathy appriciated.  Symmetrical Chest wall movement, Good air movement bilaterally, CTAB RRR,No Gallops,Rubs or new Murmurs, No Parasternal Heave +ve B.Sounds, Abd Soft, No tenderness, No organomegaly appriciated, No rebound - guarding or rigidity. No Cyanosis, Clubbing or edema, No new Rash or bruise   No  janeway, no splinter, no osler Wound on the right upper chest, slight yellow drainage.     Data Review:    CBC  Recent Labs Lab 10/11/16 0539  WBC 9.8  HGB 9.5*  HCT 30.7*  PLT 313  MCV 78.3  MCH 24.2*  MCHC 30.9  RDW 18.7*    Chemistries   Recent Labs Lab 10/08/16 0908 10/11/16 0539  NA 134* 135  K 4.2 3.9  CL 104 106  CO2 23 24  GLUCOSE 122* 93  BUN 16 21*  CREATININE 0.87 0.81  CALCIUM 8.7* 8.7*   ------------------------------------------------------------------------------------------------------------------ No results for input(s): CHOL, HDL, LDLCALC, TRIG, CHOLHDL, LDLDIRECT in the last 72 hours.  Lab Results  Component Value Date   HGBA1C 5.8 (H) 10/01/2014   ------------------------------------------------------------------------------------------------------------------ No results for input(s): TSH, T4TOTAL, T3FREE, THYROIDAB in the last 72 hours.  Invalid input(s): FREET3 ------------------------------------------------------------------------------------------------------------------ No results for input(s): VITAMINB12, FOLATE, FERRITIN, TIBC, IRON, RETICCTPCT in the last 72 hours.  Coagulation profile No results for input(s): INR, PROTIME in the last 168 hours.  No results for input(s): DDIMER in the last 72 hours.  Cardiac Enzymes No results for input(s): CKMB, TROPONINI, MYOGLOBIN in the last 168 hours.  Invalid input(s): CK ------------------------------------------------------------------------------------------------------------------ No results found for: BNP  Inpatient Medications  Scheduled Meds: . buprenorphine-naloxone  1 tablet Sublingual BID  . docusate sodium  100 mg Oral BID  . enoxaparin (LOVENOX) injection  40 mg Subcutaneous Q24H  . sodium chloride flush  3 mL Intravenous Q12H   Continuous Infusions: . sodium chloride 250 mL (10/02/16 1616)  . vancomycin Stopped (10/13/16 1210)   PRN Meds:.sodium chloride,  acetaminophen **OR** acetaminophen, hydrOXYzine, MUSCLE RUB, polyethylene glycol, sodium chloride flush, witch hazel-glycerin  Micro Results No results found for this or any previous visit (from the past 240 hour(s)).  Radiology Reports Ct Chest W Contrast  Result Date: 09/25/2016 CLINICAL DATA:  Right sternoclavicular joint infection. EXAM: CT CHEST WITH CONTRAST TECHNIQUE: Multidetector CT imaging of the chest was performed during intravenous contrast administration. CONTRAST:  163m ISOVUE-300 IOPAMIDOL (ISOVUE-300) INJECTION 61% COMPARISON:  Chest CT 08/31/2016 FINDINGS: Cardiovascular: Normal appearance of the heart and aorta. The proximal arch vessels are patent. Mediastinum/Nodes: Unchanged appearance of mildly asymmetrically enlarged right axillary lymph nodes. The visualized thyroid and thoracic esophageal course are unremarkable. Lungs/Pleura: Focal area of atelectasis in the right middle lobe is unchanged. Upper Abdomen: The visualized portions of the upper abdominal organs are normal. Musculoskeletal: Intermediate attenuation collection at the right sternoclavicular first costo manubrial joints per cysts, having decreased in size from 8.5 by 9.6 cm compared to 6.8 x 6.5 cm on the current examination. Extensive osteolysis of the clavicle, sternum and right first rib are unchanged. Previously seen inflammatory findings in the right breast have greatly decreased. IMPRESSION: 1. Decreased size of phlegmonous area of centered at the right sternoclavicular and first costal manubrial joints associated osteolysis. 2. Unchanged appearance of prominent right axillary lymph nodes that are likely reactive.  3. Decreased inflammatory change of the right breast. Electronically Signed   By: Ulyses Jarred M.D.   On: 09/25/2016 04:12    Time Spent in minutes  30   Jani Gravel M.D on 10/14/2016 at 8:24 AM  Between 7am to 7pm - Pager - [phone]  After 7pm go to [email or web address] - password [facility]  [facility] -  Office  [phone]

## 2016-10-14 NOTE — Progress Notes (Signed)
ANTIBIOTIC CONSULT NOTE   Pharmacy Consult for Vancomycin Indication: septic sternoclavicular joint  Allergies  Allergen Reactions  . Mango Flavor Hives and Swelling  . Doxycycline Other (See Comments)    "BURNS" >> ? INCREASED SENSITIVITY TO SUNLIGHT ?  . Pyridium [Phenazopyridine Hcl] Other (See Comments)    Irritation to skin  . Tramadol Nausea And Vomiting    Patient Measurements: Height: 5\' 1"  (154.9 cm) Weight: 172 lb 4.8 oz (78.2 kg) IBW/kg (Calculated) : 47.8  Vital Signs: Temp: 98.4 F (36.9 C) (07/05 1459) Temp Source: Oral (07/05 1459) BP: 100/53 (07/05 1459) Pulse Rate: 88 (07/05 1459) Intake/Output from previous day: 07/04 0701 - 07/05 0700 In: 380 [P.O.:150; I.V.:80; IV Piggyback:150] Out: -  Intake/Output from this shift: No intake/output data recorded.  Labs:  Recent Labs  10/14/16 0757  CREATININE 0.93   Estimated Creatinine Clearance: 73.1 mL/min (by C-G formula based on SCr of 0.93 mg/dL). No results for input(s): VANCOTROUGH, VANCOPEAK, VANCORANDOM, GENTTROUGH, GENTPEAK, GENTRANDOM, TOBRATROUGH, TOBRAPEAK, TOBRARND, AMIKACINPEAK, AMIKACINTROU, AMIKACIN in the last 72 hours.   Microbiology:   Medical History: Past Medical History:  Diagnosis Date  . Anxiety   . Chronic back pain   . Chronic knee pain   . COPD (chronic obstructive pulmonary disease) ([place])   . DDD (degenerative disc disease)   . Hepatitis C infection 07/13/2014  . IV drug abuse   . Nephrolithiasis   . Panic attacks   . Polysubstance abuse    cocaine, opiates, marijuana, amphetamines, "molly," crystal meth  . 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  Original plan was to keep patient inpatient for 6 weeks of IV antibiotics (10/16/16), then home on oral abx (Linezolid) for 2 weeks, thru 7/21.  Overnight, pt lost IV access and did not receive Vancomycin doses x 2.  She then  developed a fever 102.6 early this morning.  Pt has since received a PICC line and IV antibiotics were restarted.  Repeat blood cx were drawn prior to abx administration.  Antibiotics this admission: 5/22 vanc >> (7/7) -> then 2 weeks of oral abx (linezolid) 5/22 cefepime >> 5/30   Dose changes: 5/26 VT: 12 on 1000mg  IV q12 5/30 VT 15 on 1250mg  IV q12 6/5  VT 12 (dose given late)- inc 1250 Q8 for calc'd VT ~18.  6/7 VT 22 on 1250mg  q8 >> decr 1g Q8 6/19 VT = 22 on 1g q8>>decr to 750mg  q8h 6/22 VT = 18 on 750 Q8H 6/29 VT = 11 (2 hrs late) on 750 q8h - expect true trough is at goal  Culture data: 5/22 MRSA PCR: negative 5/22 Blood x 2 - negative 5/24 sternoclavicular abscess: few MRSA, (MIC to Vanc = 1), rare CoNS 5/24 fungal cx: no fungus observed 6/16 Blood x 2 - negative  7/5 blood cx x 2 - pending  Goal of Therapy:  Vancomycin trough level 15-20 mcg/ml  Plan:  Continue vanc 750mg  IV Q8H through 10/16/16. Will discuss with MD if IV antibiotic therapy needs longer treatment course given overnight fever. Follow-up repeat blood cx.  [employer], Pharm.D., BCPS Clinical Pharmacist Pager: [phone] Clinical phone for 10/14/2016 from 8:30-4:00 is [phone]. After 4pm, please call Main Rx [phone]) for assistance. 10/14/2016 3:37 PM

## 2016-10-15 DIAGNOSIS — R509 Fever, unspecified: Secondary | ICD-10-CM

## 2016-10-15 LAB — CBC
HCT: 27.6 % — ABNORMAL LOW (ref 36.0–46.0)
Hemoglobin: 8.6 g/dL — ABNORMAL LOW (ref 12.0–15.0)
MCH: 24.4 pg — ABNORMAL LOW (ref 26.0–34.0)
MCHC: 31.2 g/dL (ref 30.0–36.0)
MCV: 78.4 fL (ref 78.0–100.0)
Platelets: 232 10*3/uL (ref 150–400)
RBC: 3.52 MIL/uL — ABNORMAL LOW (ref 3.87–5.11)
RDW: 19 % — ABNORMAL HIGH (ref 11.5–15.5)
WBC: 8.2 10*3/uL (ref 4.0–10.5)

## 2016-10-15 LAB — BLOOD CULTURE ID PANEL (REFLEXED)

## 2016-10-15 LAB — COMPREHENSIVE METABOLIC PANEL
ALT: 41 U/L (ref 14–54)
AST: 41 U/L (ref 15–41)
Albumin: 2.7 g/dL — ABNORMAL LOW (ref 3.5–5.0)
Alkaline Phosphatase: 51 U/L (ref 38–126)
Anion gap: 4 — ABNORMAL LOW (ref 5–15)
BUN: 19 mg/dL (ref 6–20)
CO2: 25 mmol/L (ref 22–32)
Calcium: 8.3 mg/dL — ABNORMAL LOW (ref 8.9–10.3)
Chloride: 108 mmol/L (ref 101–111)
Creatinine, Ser: 0.86 mg/dL (ref 0.44–1.00)
GFR calc Af Amer: >60 mL/min (ref >60)
GFR calc non Af Amer: >60 mL/min (ref >60)
Glucose, Bld: 112 mg/dL — ABNORMAL HIGH (ref 65–99)
Potassium: 3.8 mmol/L (ref 3.5–5.1)
Sodium: 137 mmol/L (ref 135–145)
Total Bilirubin: 0.3 mg/dL (ref 0.3–1.2)
Total Protein: 6.6 g/dL (ref 6.5–8.1)

## 2016-10-15 LAB — SEDIMENTATION RATE: Sed Rate: 32 mm/hr — ABNORMAL HIGH (ref 0–22)

## 2016-10-15 NOTE — Progress Notes (Signed)
PHARMACY - PHYSICIAN COMMUNICATION CRITICAL VALUE ALERT - BLOOD CULTURE IDENTIFICATION (BCID)  Results for orders placed or performed during the hospital encounter of 09/24/16  Blood Culture ID Panel (Reflexed) (Collected: 10/14/2016  8:58 AM)  Result Value Ref Range   Enterococcus species NOT DETECTED NOT DETECTED   Listeria monocytogenes NOT DETECTED NOT DETECTED   Staphylococcus species DETECTED (A) NOT DETECTED   Staphylococcus aureus NOT DETECTED NOT DETECTED   Methicillin resistance NOT DETECTED NOT DETECTED   Streptococcus species NOT DETECTED NOT DETECTED   Streptococcus agalactiae NOT DETECTED NOT DETECTED   Streptococcus pneumoniae NOT DETECTED NOT DETECTED   Streptococcus pyogenes NOT DETECTED NOT DETECTED   Acinetobacter baumannii NOT DETECTED NOT DETECTED   Enterobacteriaceae species NOT DETECTED NOT DETECTED   Enterobacter cloacae complex NOT DETECTED NOT DETECTED   Escherichia coli NOT DETECTED NOT DETECTED   Klebsiella oxytoca NOT DETECTED NOT DETECTED   Klebsiella pneumoniae NOT DETECTED NOT DETECTED   Proteus species NOT DETECTED NOT DETECTED   Serratia marcescens NOT DETECTED NOT DETECTED   Haemophilus influenzae NOT DETECTED NOT DETECTED   Neisseria meningitidis NOT DETECTED NOT DETECTED   Pseudomonas aeruginosa NOT DETECTED NOT DETECTED   Candida albicans NOT DETECTED NOT DETECTED   Candida glabrata NOT DETECTED NOT DETECTED   Candida krusei NOT DETECTED NOT DETECTED   Candida parapsilosis NOT DETECTED NOT DETECTED   Candida tropicalis NOT DETECTED NOT DETECTED    Name of physician (or Provider) Contacted: Dr. Georges Mouse  Changes to prescribed antibiotics required: Likely contaminant, no changes needed.  [employer], Pharm.D., BCPS Clinical Pharmacist 10/15/2016 10:41 AM

## 2016-10-15 NOTE — Progress Notes (Signed)
Patient ID: [name], female   DOB: 11-27-1970, 46 y.o.   MRN: [id number]                                                                PROGRESS NOTE                                                                                                                                                                                                             Patient Demographics:    [name], is a 46 y.o. female, DOB - 27-Nov-1970, ZGY:[id number]  Admit date - 09/24/2016   Admitting Physician Phillips Grout, MD  Outpatient Primary MD for the patient is Patient, [name]  LOS - 20  Outpatient Specialists:    Chief Complaint  Patient presents with  . Fever       Brief Narrative    [name] a 46 y.o.female with a history of IV drug use, hepatitis C, septic arthritis, endocarditis, sternoclavicular abscess. She presents after leaving AMA for treatment of her abscess. She continues IV antibiotics at this moment.  Plan is for vancomycin for 62moe days and if stable, discharge on linezolid for 2 more weeks.   Subjective:    Julie Mccullumtoday has been afebrile overnite,   No headache, No chest pain, No abdominal pain - No Nausea, No new weakness tingling or numbness, No Cough - SOB.     Assessment  & Plan :    Principal Problem:   Septic arthritis of right sternoclavicular joint (HCC) Active Problems:   Polysubstance abuse   IV drug abuse   COPD (chronic obstructive pulmonary disease) (HCC)   Abscess of chest (HCC)   Pulmonary embolism, septic (HCC)   Endocarditis of tricuspid valve   Wound infection   Fever resolved Check ESR Blood culture x2. => 1/2 MSSA Continue vanco I had a discussion with ID JMichel Bickers he would consider just switching to linezolid and continue to treat as per prior course We will switch to linezolid tomorrow AM  Abscess of chest Septic arthritis of right sternoclavicular joint -Previous plans to treat with vancomycin for a total of 8  weeks. -Given history of patient leaving AMA she is not candidate for SNF placement -per ID recommendations will continue IV vancomycin for 554mo  days (including today; last day of vancomycin 10/16/16)and then discharge on linezolid for 2 more additional weeks (as recommended by ID, discussed and clarified with Dr. Megan Salon) -patient will need follow up with ID after discharge; appointment will be arranged for her. -patient remains stable and in no distress. -will follow rec's from plastic surgery regarding wound care; now using Vaseline and dry dressing. Wound healing properly and w/o surrounding erythema.  -pharmacy following, monitoring and adjusting vancomycin level. -Vancomycin target level 15-20 mcg/ml -Will continue supportive care    IV drug abuse -Cessation counseling once again provided and encouraged -will continue Suboxone  -patient seen by Dr. Daryll Drown and found to be a good candidate for suboxone clinic. -doing good overall and w/o acute signs of withdrawal -will received suboxone prescription from IM suboxone clinic and appointment follow up details  Chronic hep C -follow up with ID as an outpatient -needs 6 months sober and then can be a candidate for treatment. -LFT's has remained stable; continue intermittent monitoring   COPD -good air movement bilaterally  -will continue PRN albuterol  -will need PFT's as an outpatient for classification and stratification  History of endocarditis -Last TEE on 09/02/2016 reported no vegetations. -patient has remained afebrile and denies CP or palpitations -will continue IV antibiotics.  Constipation/hemorroids  -stable/improved -will continue colace and miralax -continue anusol   DVT prophylaxis:Lovenox Code Status:Full code Family Communication:w patient Disposition Plan:remains inpatient for now; continue IV antibiotics as dictated by ID; continue wound care by plastic  surgery.   Consultants:  Infectious disease  Plastic surgery   Procedures:  See below for x-ray reports   Antimicrobials:  Vancomycin (with plans to continue until 10/16/16)  Linezolid to be prescribed at discharge; with plans for 2 weeks of treatment     Lab Results  Component Value Date   PLT 232 10/15/2016      Anti-infectives    Start     Dose/Rate Route Frequency Ordered Stop   09/28/16 1500  vancomycin (VANCOCIN) IVPB 750 mg/150 ml premix     750 mg 150 mL/hr over 60 Minutes Intravenous Every 8 hours 09/28/16 1403     09/25/16 1000  vancomycin (VANCOCIN) IVPB 1000 mg/200 mL premix  Status:  Discontinued     1,000 mg 200 mL/hr over 60 Minutes Intravenous Every 8 hours 09/25/16 0424 09/28/16 1403   09/25/16 0130  vancomycin (VANCOCIN) IVPB 1000 mg/200 mL premix     1,000 mg 200 mL/hr over 60 Minutes Intravenous  Once 09/25/16 0119 09/25/16 0331        Objective:   Vitals:   10/14/16 0557 10/14/16 0658 10/14/16 1459 10/15/16 0516  BP: 107/69  (!) 100/53 107/69  Pulse: (!) 117  88 82  Resp: 18  17 18   Temp: (!) 102.6 F (39.2 C) (!) 100.5 F (38.1 C) 98.4 F (36.9 C) 98.9 F (37.2 C)  TempSrc: Oral Oral Oral Oral  SpO2: 96%  98% 100%  Weight:      Height:        Wt Readings from Last 3 Encounters:  09/25/16 78.2 kg (172 lb 4.8 oz)  08/31/16 74.6 kg (164 lb 6.4 oz)  03/17/16 85.3 kg (188 lb)     Intake/Output Summary (Last 24 hours) at 10/15/16 1337 Last data filed at 10/15/16 1200  Gross per 24 hour  Intake             1470 ml  Output  0 ml  Net             1470 ml     Physical Exam  Awake Alert, Oriented X 3, No new F.N deficits, Normal affect [place].AT,PERRAL Supple Neck,No JVD, No cervical lymphadenopathy appriciated.  Symmetrical Chest wall movement, Good air movement bilaterally, CTAB RRR,No Gallops,Rubs or new Murmurs, No Parasternal Heave +ve B.Sounds, Abd Soft, No tenderness, No organomegaly appriciated, No  rebound - guarding or rigidity. No Cyanosis, Clubbing or edema, No new Rash or bruise  Sternal wound stable,     Data Review:    CBC  Recent Labs Lab 10/11/16 0539 10/15/16 0440  WBC 9.8 8.2  HGB 9.5* 8.6*  HCT 30.7* 27.6*  PLT 313 232  MCV 78.3 78.4  MCH 24.2* 24.4*  MCHC 30.9 31.2  RDW 18.7* 19.0*    Chemistries   Recent Labs Lab 10/11/16 0539 10/14/16 0757 10/15/16 0440  NA 135 132* 137  K 3.9 3.5 3.8  CL 106 102 108  CO2 24 23 25   GLUCOSE 93 152* 112*  BUN 21* 19 19  CREATININE 0.81 0.93 0.86  CALCIUM 8.7* 8.4* 8.3*  AST  --   --  41  ALT  --   --  41  ALKPHOS  --   --  51  BILITOT  --   --  0.3   ------------------------------------------------------------------------------------------------------------------ No results for input(s): CHOL, HDL, LDLCALC, TRIG, CHOLHDL, LDLDIRECT in the last 72 hours.  Lab Results  Component Value Date   HGBA1C 5.8 (H) 10/01/2014   ------------------------------------------------------------------------------------------------------------------ No results for input(s): TSH, T4TOTAL, T3FREE, THYROIDAB in the last 72 hours.  Invalid input(s): FREET3 ------------------------------------------------------------------------------------------------------------------ No results for input(s): VITAMINB12, FOLATE, FERRITIN, TIBC, IRON, RETICCTPCT in the last 72 hours.  Coagulation profile No results for input(s): INR, PROTIME in the last 168 hours.  No results for input(s): DDIMER in the last 72 hours.  Cardiac Enzymes No results for input(s): CKMB, TROPONINI, MYOGLOBIN in the last 168 hours.  Invalid input(s): CK ------------------------------------------------------------------------------------------------------------------ No results found for: BNP  Inpatient Medications  Scheduled Meds: . buprenorphine-naloxone  1 tablet Sublingual BID  . docusate sodium  100 mg Oral BID  . enoxaparin (LOVENOX) injection  40 mg  Subcutaneous Q24H  . sodium chloride flush  3 mL Intravenous Q12H   Continuous Infusions: . sodium chloride 1,000 mL (10/14/16 0947)  . vancomycin Stopped (10/15/16 1120)   PRN Meds:.sodium chloride, acetaminophen **OR** acetaminophen, hydrOXYzine, MUSCLE RUB, polyethylene glycol, sodium chloride flush, sodium chloride flush, witch hazel-glycerin  Micro Results Recent Results (from the past 240 hour(s))  Culture, blood (routine x 2)     Status: None (Preliminary result)   Collection Time: 10/14/16  8:58 AM  Result Value Ref Range Status   Specimen Description BLOOD LEFT ANTECUBITAL  Final   Special Requests   Final    BOTTLES DRAWN AEROBIC AND ANAEROBIC Blood Culture adequate volume   Culture  Setup Time   Final    GRAM POSITIVE COCCI IN CLUSTERS ANAEROBIC BOTTLE ONLY Organism ID to follow    Culture GRAM POSITIVE COCCI  Final   Report Status PENDING  Incomplete  Blood Culture ID Panel (Reflexed)     Status: Abnormal   Collection Time: 10/14/16  8:58 AM  Result Value Ref Range Status   Enterococcus species NOT DETECTED NOT DETECTED Final   Listeria monocytogenes NOT DETECTED NOT DETECTED Final   Staphylococcus species DETECTED (A) NOT DETECTED Final    Comment: Methicillin (oxacillin) susceptible coagulase negative staphylococcus.  Possible blood culture contaminant (unless isolated from more than one blood culture draw or clinical case suggests pathogenicity). No antibiotic treatment is indicated for blood  culture contaminants. CRITICAL RESULT CALLED TO, READ BACK BY AND VERIFIED WITH: Hughie Closs PHARMD AT 1000 ON [id number] BY SJW    Staphylococcus aureus NOT DETECTED NOT DETECTED Final   Methicillin resistance NOT DETECTED NOT DETECTED Final   Streptococcus species NOT DETECTED NOT DETECTED Final   Streptococcus agalactiae NOT DETECTED NOT DETECTED Final   Streptococcus pneumoniae NOT DETECTED NOT DETECTED Final   Streptococcus pyogenes NOT DETECTED NOT DETECTED Final    Acinetobacter baumannii NOT DETECTED NOT DETECTED Final   Enterobacteriaceae species NOT DETECTED NOT DETECTED Final   Enterobacter cloacae complex NOT DETECTED NOT DETECTED Final   Escherichia coli NOT DETECTED NOT DETECTED Final   Klebsiella oxytoca NOT DETECTED NOT DETECTED Final   Klebsiella pneumoniae NOT DETECTED NOT DETECTED Final   Proteus species NOT DETECTED NOT DETECTED Final   Serratia marcescens NOT DETECTED NOT DETECTED Final   Haemophilus influenzae NOT DETECTED NOT DETECTED Final   Neisseria meningitidis NOT DETECTED NOT DETECTED Final   Pseudomonas aeruginosa NOT DETECTED NOT DETECTED Final   Candida albicans NOT DETECTED NOT DETECTED Final   Candida glabrata NOT DETECTED NOT DETECTED Final   Candida krusei NOT DETECTED NOT DETECTED Final   Candida parapsilosis NOT DETECTED NOT DETECTED Final   Candida tropicalis NOT DETECTED NOT DETECTED Final  Culture, blood (routine x 2)     Status: None (Preliminary result)   Collection Time: 10/14/16  8:59 AM  Result Value Ref Range Status   Specimen Description BLOOD PICC LINE  Final   Special Requests   Final    BOTTLES DRAWN AEROBIC AND ANAEROBIC Blood Culture adequate volume   Culture NO GROWTH 1 DAY  Final   Report Status PENDING  Incomplete    Radiology Reports Dg Chest 2 View  Result Date: 10/14/2016 CLINICAL DATA:  Fever EXAM: CHEST  2 VIEW COMPARISON:  09/25/2016 CT FINDINGS: Right PICC line tip is in the SVC. Heart and mediastinal contours are within normal limits. No focal opacities or effusions. No acute bony abnormality. IMPRESSION: No active cardiopulmonary disease. Electronically Signed   By: Rolm Baptise M.D.   On: 10/14/2016 09:34   Ct Chest W Contrast  Result Date: 09/25/2016 CLINICAL DATA:  Right sternoclavicular joint infection. EXAM: CT CHEST WITH CONTRAST TECHNIQUE: Multidetector CT imaging of the chest was performed during intravenous contrast administration. CONTRAST:  19m ISOVUE-300 IOPAMIDOL  (ISOVUE-300) INJECTION 61% COMPARISON:  Chest CT 08/31/2016 FINDINGS: Cardiovascular: Normal appearance of the heart and aorta. The proximal arch vessels are patent. Mediastinum/Nodes: Unchanged appearance of mildly asymmetrically enlarged right axillary lymph nodes. The visualized thyroid and thoracic esophageal course are unremarkable. Lungs/Pleura: Focal area of atelectasis in the right middle lobe is unchanged. Upper Abdomen: The visualized portions of the upper abdominal organs are normal. Musculoskeletal: Intermediate attenuation collection at the right sternoclavicular first costo manubrial joints per cysts, having decreased in size from 8.5 by 9.6 cm compared to 6.8 x 6.5 cm on the current examination. Extensive osteolysis of the clavicle, sternum and right first rib are unchanged. Previously seen inflammatory findings in the right breast have greatly decreased. IMPRESSION: 1. Decreased size of phlegmonous area of centered at the right sternoclavicular and first costal manubrial joints associated osteolysis. 2. Unchanged appearance of prominent right axillary lymph nodes that are likely reactive. 3. Decreased inflammatory change of the right breast. Electronically Signed  By: Ulyses Jarred M.D.   On: 09/25/2016 04:12    Time Spent in minutes  30   Jani Gravel M.D on 10/15/2016 at 1:37 PM  Between 7am to 7pm - Pager - [phone]  After 7pm go to [email or web address] - password [facility]  [facility] -  Office  [phone]

## 2016-10-16 MED ORDER — LINEZOLID 600 MG PO TABS
600.0000 mg | ORAL_TABLET | Freq: Two times a day (BID) | ORAL | Status: DC
  Administered 2016-10-16 – 2016-10-18 (×5): 600 mg via ORAL
  Filled 2016-10-16 (×7): qty 1

## 2016-10-16 NOTE — Progress Notes (Signed)
Patient ID: [name], female   DOB: 09-30-1970, 46 y.o.   MRN: [id number]                                                                PROGRESS NOTE                                                                                                                                                                                                             Patient Demographics:    [name], is a 46 y.o. female, DOB - 04/08/1971, BDZ:[id number]  Admit date - 09/24/2016   Admitting Physician Phillips Grout, MD  Outpatient Primary MD for the patient is Patient, [name]  LOS - 21  Outpatient Specialists:    Chief Complaint  Patient presents with  . Fever       Brief Narrative    46 y.o.female with a history of IV drug use, hepatitis C, septic arthritis, endocarditis, sternoclavicular abscess. She presents after leaving AMA for treatment of her abscess. She continues IV antibiotics at this moment.  Plan is for vancomycin for 87moe days and if stable, discharge on linezolid for 2 more weeks.   Subjective:    [name] has been afebrile, doing well.  I spoke with ID yesterday ok to convert to linezolid.  No headache, No chest pain, No abdominal pain - No Nausea, No new weakness tingling or numbness, No Cough - SOB.    Assessment  & Plan :    Principal Problem:   Septic arthritis of right sternoclavicular joint (HCC) Active Problems:   Polysubstance abuse   IV drug abuse   COPD (chronic obstructive pulmonary disease) (HCC)   Abscess of chest (HCC)   Pulmonary embolism, septic (HCC)   Endocarditis of tricuspid valve   Wound infection   Fever  Fever resolved Check ESR Blood culture x2. => 1/2 MSSA Continue vanco I had a discussion with ID JMichel Bickers he would consider just switching to linezolid and continue to treat as per prior course Switch to linezolid 7/7  Abscess of chest Septic arthritis of right sternoclavicular joint -Previous plans to treat with  vancomycin for a total of 8 weeks. -Given history of patient leaving AMA she is not candidate for SNF placement -per ID recommendations will  continue IV vancomycin for 83moe days (including today; last day of vancomycin 10/16/16)and then discharge on linezolid for 2 more additional weeks (as recommended by ID, discussed and clarified with Dr. CMegan Salon -patient will need follow up with ID after discharge; appointment will be arranged for her. -patient remains stable and in no distress. -will follow rec's from plastic surgery regarding wound care; now using Vaseline and dry dressing. Wound healing properly and w/o surrounding erythema.  -pharmacy following, monitoring and adjusting vancomycin level. -Vancomycin target level 15-20 mcg/ml -Will continue supportive care  D/c vanco Start linezolid today, if afebrile can discharge home with linezolid x 2weeks.     IV drug abuse -Cessation counseling once again provided and encouraged -will continue Suboxone  -patient seen by Dr. MDaryll Drownand found to be a good candidate for suboxone clinic. -doing good overall and w/o acute signs of withdrawal -will received suboxone prescription from [facility] suboxone clinic and appointment follow up details  Chronic hep C -follow up with ID as an outpatient -needs 6 months sober and then can be a candidate for treatment. -LFT's has remained stable; continue intermittent monitoring   COPD -good air movement bilaterally  -will continue PRN albuterol  -will need PFT's as an outpatient for classification and stratification  History of endocarditis -Last TEE on 09/02/2016 reported no vegetations. -patient has remained afebrile and denies CP or palpitations -will continue IV antibiotics.  Constipation/hemorroids  -stable/improved -will continue colace and miralax -continue anusol   DVT prophylaxis:Lovenox Code Status:Full code Family Communication:w patient Disposition Plan:remains inpatient for  now; continue IV antibiotics as dictated by ID; continue wound care by plastic surgery.   Consultants:  Infectious disease  Plastic surgery   Procedures:  See below for x-ray reports   Antimicrobials:  Vancomycin (with plans to continue until 10/16/16)  Linezolid to be prescribed at discharge; with plans for 2 weeks of treatment       Lab Results  Component Value Date   PLT 232 10/15/2016     Anti-infectives    Start     Dose/Rate Route Frequency Ordered Stop   09/28/16 1500  vancomycin (VANCOCIN) IVPB 750 mg/150 ml premix     750 mg 150 mL/hr over 60 Minutes Intravenous Every 8 hours 09/28/16 1403     09/25/16 1000  vancomycin (VANCOCIN) IVPB 1000 mg/200 mL premix  Status:  Discontinued     1,000 mg 200 mL/hr over 60 Minutes Intravenous Every 8 hours 09/25/16 0424 09/28/16 1403   09/25/16 0130  vancomycin (VANCOCIN) IVPB 1000 mg/200 mL premix     1,000 mg 200 mL/hr over 60 Minutes Intravenous  Once 09/25/16 0119 09/25/16 0331        Objective:   Vitals:   10/14/16 1459 10/15/16 0516 10/15/16 1450 10/15/16 2256  BP: (!) 100/53 107/69 (!) 97/51 (!) 130/94  Pulse: 88 82 68 74  Resp: _0 Temp: 98.4 F (36.9 C) 98.9 F (37.2 C) 98 F (36.7 C) 98.9 F (37.2 C)  TempSrc: Oral Oral  Oral  SpO2: 98% 100% 100% 97%  Weight:      Height:        Wt Readings from Last 3 Encounters:  09/25/16 78.2 kg (172 lb 4.8 oz)  08/31/16 74.6 kg (164 lb 6.4 oz)  03/17/16 85.3 kg (188 lb)     Intake/Output Summary (Last 24 hours) at 10/16/16 0805 Last data filed at 10/15/16 1517  Gross per 24 hour  Intake  1440 ml  Output                0 ml  Net             1440 ml     Physical Exam  Awake Alert, Oriented X 3, No new F.N deficits, Normal affect [place].AT,PERRAL Supple Neck,No JVD, No cervical lymphadenopathy appriciated.  Symmetrical Chest wall movement, Good air movement bilaterally, CTAB RRR,No Gallops,Rubs or new Murmurs, No  Parasternal Heave +ve B.Sounds, Abd Soft, No tenderness, No organomegaly appriciated, No rebound - guarding or rigidity. No Cyanosis, Clubbing or edema, No new Rash or bruise   No janeway, no osler, no splinter Wound on right upper chest, stable. Slight yellow discharge    Data Review:    CBC  Recent Labs Lab 10/11/16 0539 10/15/16 0440  WBC 9.8 8.2  HGB 9.5* 8.6*  HCT 30.7* 27.6*  PLT 313 232  MCV 78.3 78.4  MCH 24.2* 24.4*  MCHC 30.9 31.2  RDW 18.7* 19.0*    Chemistries   Recent Labs Lab 10/11/16 0539 10/14/16 0757 10/15/16 0440  NA 135 132* 137  K 3.9 3.5 3.8  CL 106 102 108  CO2 _0 GLUCOSE 93 152* 112*  BUN 21* 19 19  CREATININE 0.81 0.93 0.86  CALCIUM 8.7* 8.4* 8.3*  AST  --   --  41  ALT  --   --  41  ALKPHOS  --   --  51  BILITOT  --   --  0.3   ------------------------------------------------------------------------------------------------------------------ No results for input(s): CHOL, HDL, LDLCALC, TRIG, CHOLHDL, LDLDIRECT in the last 72 hours.  Lab Results  Component Value Date   HGBA1C 5.8 (H) 10/01/2014   ------------------------------------------------------------------------------------------------------------------ No results for input(s): TSH, T4TOTAL, T3FREE, THYROIDAB in the last 72 hours.  Invalid input(s): FREET3 ------------------------------------------------------------------------------------------------------------------ No results for input(s): VITAMINB12, FOLATE, FERRITIN, TIBC, IRON, RETICCTPCT in the last 72 hours.  Coagulation profile No results for input(s): INR, PROTIME in the last 168 hours.  No results for input(s): DDIMER in the last 72 hours.  Cardiac Enzymes No results for input(s): CKMB, TROPONINI, MYOGLOBIN in the last 168 hours.  Invalid input(s): CK ------------------------------------------------------------------------------------------------------------------ No results found for:  BNP  Inpatient Medications  Scheduled Meds: . buprenorphine-naloxone  1 tablet Sublingual BID  . docusate sodium  100 mg Oral BID  . enoxaparin (LOVENOX) injection  40 mg Subcutaneous Q24H  . sodium chloride flush  3 mL Intravenous Q12H   Continuous Infusions: . sodium chloride 1,000 mL (10/14/16 0947)  . vancomycin Stopped (10/16/16 0318)   PRN Meds:.sodium chloride, acetaminophen **OR** acetaminophen, hydrOXYzine, MUSCLE RUB, polyethylene glycol, sodium chloride flush, sodium chloride flush, witch hazel-glycerin  Micro Results Recent Results (from the past 240 hour(s))  Culture, blood (routine x 2)     Status: None (Preliminary result)   Collection Time: 10/14/16  8:58 AM  Result Value Ref Range Status   Specimen Description BLOOD LEFT ANTECUBITAL  Final   Special Requests   Final    BOTTLES DRAWN AEROBIC AND ANAEROBIC Blood Culture adequate volume   Culture  Setup Time   Final    GRAM POSITIVE COCCI IN CLUSTERS ANAEROBIC BOTTLE ONLY CRITICAL RESULT CALLED TO, READ BACK BY AND VERIFIED WITH: Hughie Closs PHARMD AT 1000 ON [id number] BY SJW    Culture GRAM POSITIVE COCCI  Final   Report Status PENDING  Incomplete  Blood Culture ID Panel (Reflexed)     Status: Abnormal   Collection Time: 10/14/16  8:58 AM  Result Value Ref Range Status   Enterococcus species NOT DETECTED NOT DETECTED Final   Listeria monocytogenes NOT DETECTED NOT DETECTED Final   Staphylococcus species DETECTED (A) NOT DETECTED Final    Comment: Methicillin (oxacillin) susceptible coagulase negative staphylococcus. Possible blood culture contaminant (unless isolated from more than one blood culture draw or clinical case suggests pathogenicity). No antibiotic treatment is indicated for blood  culture contaminants. CRITICAL RESULT CALLED TO, READ BACK BY AND VERIFIED WITH: Hughie Closs PHARMD AT 1000 ON [id number] BY SJW    Staphylococcus aureus NOT DETECTED NOT DETECTED Final   Methicillin resistance NOT DETECTED NOT  DETECTED Final   Streptococcus species NOT DETECTED NOT DETECTED Final   Streptococcus agalactiae NOT DETECTED NOT DETECTED Final   Streptococcus pneumoniae NOT DETECTED NOT DETECTED Final   Streptococcus pyogenes NOT DETECTED NOT DETECTED Final   Acinetobacter baumannii NOT DETECTED NOT DETECTED Final   Enterobacteriaceae species NOT DETECTED NOT DETECTED Final   Enterobacter cloacae complex NOT DETECTED NOT DETECTED Final   Escherichia coli NOT DETECTED NOT DETECTED Final   Klebsiella oxytoca NOT DETECTED NOT DETECTED Final   Klebsiella pneumoniae NOT DETECTED NOT DETECTED Final   Proteus species NOT DETECTED NOT DETECTED Final   Serratia marcescens NOT DETECTED NOT DETECTED Final   Haemophilus influenzae NOT DETECTED NOT DETECTED Final   Neisseria meningitidis NOT DETECTED NOT DETECTED Final   Pseudomonas aeruginosa NOT DETECTED NOT DETECTED Final   Candida albicans NOT DETECTED NOT DETECTED Final   Candida glabrata NOT DETECTED NOT DETECTED Final   Candida krusei NOT DETECTED NOT DETECTED Final   Candida parapsilosis NOT DETECTED NOT DETECTED Final   Candida tropicalis NOT DETECTED NOT DETECTED Final  Culture, blood (routine x 2)     Status: None (Preliminary result)   Collection Time: 10/14/16  8:59 AM  Result Value Ref Range Status   Specimen Description BLOOD PICC LINE  Final   Special Requests   Final    BOTTLES DRAWN AEROBIC AND ANAEROBIC Blood Culture adequate volume   Culture NO GROWTH 1 DAY  Final   Report Status PENDING  Incomplete    Radiology Reports Dg Chest 2 View  Result Date: 10/14/2016 CLINICAL DATA:  Fever EXAM: CHEST  2 VIEW COMPARISON:  09/25/2016 CT FINDINGS: Right PICC line tip is in the SVC. Heart and mediastinal contours are within normal limits. No focal opacities or effusions. No acute bony abnormality. IMPRESSION: No active cardiopulmonary disease. Electronically Signed   By: Rolm Baptise M.D.   On: 10/14/2016 09:34   Ct Chest W Contrast  Result  Date: 09/25/2016 CLINICAL DATA:  Right sternoclavicular joint infection. EXAM: CT CHEST WITH CONTRAST TECHNIQUE: Multidetector CT imaging of the chest was performed during intravenous contrast administration. CONTRAST:  194m ISOVUE-300 IOPAMIDOL (ISOVUE-300) INJECTION 61% COMPARISON:  Chest CT 08/31/2016 FINDINGS: Cardiovascular: Normal appearance of the heart and aorta. The proximal arch vessels are patent. Mediastinum/Nodes: Unchanged appearance of mildly asymmetrically enlarged right axillary lymph nodes. The visualized thyroid and thoracic esophageal course are unremarkable. Lungs/Pleura: Focal area of atelectasis in the right middle lobe is unchanged. Upper Abdomen: The visualized portions of the upper abdominal organs are normal. Musculoskeletal: Intermediate attenuation collection at the right sternoclavicular first costo manubrial joints per cysts, having decreased in size from 8.5 by 9.6 cm compared to 6.8 x 6.5 cm on the current examination. Extensive osteolysis of the clavicle, sternum and right first rib are unchanged. Previously seen inflammatory findings in the right breast have greatly decreased.  IMPRESSION: 1. Decreased size of phlegmonous area of centered at the right sternoclavicular and first costal manubrial joints associated osteolysis. 2. Unchanged appearance of prominent right axillary lymph nodes that are likely reactive. 3. Decreased inflammatory change of the right breast. Electronically Signed   By: Ulyses Jarred M.D.   On: 09/25/2016 04:12    Time Spent in minutes  30   Jani Gravel M.D on 10/16/2016 at 8:05 AM  Between 7am to 7pm - Pager - [phone]  After 7pm go to [email or web address] - password [facility]  [facility] -  Office  [phone]

## 2016-10-17 DIAGNOSIS — J449 Chronic obstructive pulmonary disease, unspecified: Secondary | ICD-10-CM

## 2016-10-17 DIAGNOSIS — R509 Fever, unspecified: Secondary | ICD-10-CM

## 2016-10-17 DIAGNOSIS — M009 Pyogenic arthritis, unspecified: Principal | ICD-10-CM

## 2016-10-17 DIAGNOSIS — F1910 Other psychoactive substance abuse, uncomplicated: Secondary | ICD-10-CM

## 2016-10-17 LAB — COMPREHENSIVE METABOLIC PANEL
ALT: 46 U/L (ref 14–54)
AST: 41 U/L (ref 15–41)
Albumin: 2.9 g/dL — ABNORMAL LOW (ref 3.5–5.0)
Alkaline Phosphatase: 54 U/L (ref 38–126)
Anion gap: 6 (ref 5–15)
BUN: 19 mg/dL (ref 6–20)
CO2: 27 mmol/L (ref 22–32)
Calcium: 9 mg/dL (ref 8.9–10.3)
Chloride: 103 mmol/L (ref 101–111)
Creatinine, Ser: 0.86 mg/dL (ref 0.44–1.00)
GFR calc Af Amer: >60 mL/min (ref >60)
GFR calc non Af Amer: >60 mL/min (ref >60)
Glucose, Bld: 125 mg/dL — ABNORMAL HIGH (ref 65–99)
Potassium: 3.9 mmol/L (ref 3.5–5.1)
Sodium: 136 mmol/L (ref 135–145)
Total Bilirubin: 0.2 mg/dL — ABNORMAL LOW (ref 0.3–1.2)
Total Protein: 7.1 g/dL (ref 6.5–8.1)

## 2016-10-17 LAB — CBC
HCT: 30 % — ABNORMAL LOW (ref 36.0–46.0)
Hemoglobin: 9.3 g/dL — ABNORMAL LOW (ref 12.0–15.0)
MCH: 24.1 pg — ABNORMAL LOW (ref 26.0–34.0)
MCHC: 31 g/dL (ref 30.0–36.0)
MCV: 77.7 fL — ABNORMAL LOW (ref 78.0–100.0)
Platelets: 273 10*3/uL (ref 150–400)
RBC: 3.86 MIL/uL — ABNORMAL LOW (ref 3.87–5.11)
RDW: 18.3 % — ABNORMAL HIGH (ref 11.5–15.5)
WBC: 7.6 10*3/uL (ref 4.0–10.5)

## 2016-10-17 LAB — CULTURE, BLOOD (ROUTINE X 2): Special Requests: ADEQUATE

## 2016-10-17 MED ORDER — LINEZOLID 600 MG PO TABS: 600.0000 mg | ORAL_TABLET | Freq: Two times a day (BID) | ORAL | 0 refills | Status: DC

## 2016-10-17 MED ORDER — BUPRENORPHINE HCL-NALOXONE HCL 8-2 MG SL SUBL: 1.0000 | SUBLINGUAL_TABLET | Freq: Two times a day (BID) | SUBLINGUAL | Status: DC

## 2016-10-17 NOTE — Plan of Care (Signed)
Problem: Physical Regulation: Goal: Will remain free from infection Outcome: Progressing Pt will remain free from further infection with antibiotic therapy during this hospitalization.

## 2016-10-17 NOTE — Progress Notes (Signed)
Orders reviewed

## 2016-10-17 NOTE — Care Management Important Message (Signed)
Important Message  Patient Details  Name: ESTEEN DELPRIORE MRN: [id number] Date of Birth: 05-Jan-1971   Medicare Important Message Given:  Yes    Maryclare Labrador, RN 10/17/2016, 10:23 AM

## 2016-10-17 NOTE — Discharge Summary (Signed)
Physician Discharge Summary  [name]:[id number] DOB: 1970-07-05 DOA: 09/24/2016  PCP: Patient, [name]  Admit date: 09/24/2016 Discharge date: 10/17/2016  Admitted From: home Disposition:  Home  Recommendations for Outpatient Follow-up:  1. Follow up with Suboxone clinic in 1-2 weeks 2. Please obtain BMP/CBC in one week  Home Health:no Equipment/Devices:none  Discharge Condition:stable CODE STATUS:full Diet recommendation: Regular diet  Brief/Interim Summary: 46 y.o. female past medical history of IVDA, hepatitis C septic arthritis, endocarditis during a clavicular abscess with a wound VAC and 90 done by CT surgery and 09/08/2011, with cultures growing MRSA, which ID recommended 8 weeks of IV vancomycin presents to the ED after leaving AMA 5 days prior to admission. Due to the patient's history of IVDA it was recommended she would have to stay in the hospital through the duration of her IV antibiotic therapy as noted LTAC or skilled nursing facility will take her due to her noncompliance and her history of AMA. As per patient her last use of IV heroin was 24 hours prior to admission.  Discharge Diagnoses:  Principal Problem:   Septic arthritis of right sternoclavicular joint ([place]) Active Problems:   Polysubstance abuse   IV drug abuse   COPD (chronic obstructive pulmonary disease) (HCC)   Abscess of chest (HCC)   Pulmonary embolism, septic (HCC)   Endocarditis of tricuspid valve   Wound infection   Fever  Fever: No resolved, ESR 32. 1/2 blood cultures grew necessary. The case was discussed with ID recommended 5 days of IV vancomycin that on 10/16/2016 switch to IV Linezolid for 2 weeks.  Septic arthritis of right sternoclavicular joint ([place]): ID was consulted who recommended to continue IV vancomycin 5 more days including the day of admission and then switch on 10/16/2016 her to 2 weeks of Linezolide. Disposition discussed by Dr. Jeneen Rinks came with Dr.  Megan Salon. Patient will need an ID follow-up after discharge. Vascular surgery was consulted recommended daily Vaseline's and dry dressings and follow-up recommended outpatient. Linezolid started on 10/16/2016 she'll continue this for 2 weeks.  IVDA: Continue Suboxone. Patient seen by Dr. Koren Bound and found to be a candidate for Suboxone clinic. She has received a prescription for Suboxone internal medicine clinic and has an appointment in follow-up details.  Chronic hepatitis C: Follow-up with ID clinic as an outpatient. LFTs remain stable.  COPD: At baseline seems to be stable.  History of endocarditis: The last TEE on 09/02/2016 which reported no vegetation. Patient has remained afebrile denies any chest pain. We'll continue Nasalide.  Constipation:  continue Colace and MiraLAX.     Discharge Instructions  Discharge Instructions    Diet - low sodium heart healthy    Complete by:  As directed    Increase activity slowly    Complete by:  As directed      Allergies as of 10/17/2016      Reactions   Mango Flavor Hives, Swelling   Doxycycline Other (See Comments)   "BURNS" >> ? INCREASED SENSITIVITY TO SUNLIGHT ?   Pyridium [phenazopyridine Hcl] Other (See Comments)   Irritation to skin   Tramadol Nausea And Vomiting      Medication List    TAKE these medications   buprenorphine-naloxone 8-2 mg Subl SL tablet Commonly known as:  SUBOXONE Place 1 tablet under the tongue 2 (two) times daily.   linezolid 600 MG tablet Commonly known as:  ZYVOX Take 1 tablet (600 mg total) by mouth every 12 (twelve) hours.  Follow-up Information    Dillingham, Loel Lofty, DO Follow up.   Specialty:  Plastic Surgery Contact information: [address] [place] [address] [phone]          Allergies  Allergen Reactions  . Mango Flavor Hives and Swelling  . Doxycycline Other (See Comments)    "BURNS" >> ? INCREASED SENSITIVITY TO SUNLIGHT ?  .  Pyridium [Phenazopyridine Hcl] Other (See Comments)    Irritation to skin  . Tramadol Nausea And Vomiting    Consultations:  ID   Procedures/Studies: Dg Chest 2 View  Result Date: 10/14/2016 CLINICAL DATA:  Fever EXAM: CHEST  2 VIEW COMPARISON:  09/25/2016 CT FINDINGS: Right PICC line tip is in the SVC. Heart and mediastinal contours are within normal limits. No focal opacities or effusions. No acute bony abnormality. IMPRESSION: No active cardiopulmonary disease. Electronically Signed   By: Rolm Baptise M.D.   On: 10/14/2016 09:34   Ct Chest W Contrast  Result Date: 09/25/2016 CLINICAL DATA:  Right sternoclavicular joint infection. EXAM: CT CHEST WITH CONTRAST TECHNIQUE: Multidetector CT imaging of the chest was performed during intravenous contrast administration. CONTRAST:  182m ISOVUE-300 IOPAMIDOL (ISOVUE-300) INJECTION 61% COMPARISON:  Chest CT 08/31/2016 FINDINGS: Cardiovascular: Normal appearance of the heart and aorta. The proximal arch vessels are patent. Mediastinum/Nodes: Unchanged appearance of mildly asymmetrically enlarged right axillary lymph nodes. The visualized thyroid and thoracic esophageal course are unremarkable. Lungs/Pleura: Focal area of atelectasis in the right middle lobe is unchanged. Upper Abdomen: The visualized portions of the upper abdominal organs are normal. Musculoskeletal: Intermediate attenuation collection at the right sternoclavicular first costo manubrial joints per cysts, having decreased in size from 8.5 by 9.6 cm compared to 6.8 x 6.5 cm on the current examination. Extensive osteolysis of the clavicle, sternum and right first rib are unchanged. Previously seen inflammatory findings in the right breast have greatly decreased. IMPRESSION: 1. Decreased size of phlegmonous area of centered at the right sternoclavicular and first costal manubrial joints associated osteolysis. 2. Unchanged appearance of prominent right axillary lymph nodes that are likely  reactive. 3. Decreased inflammatory change of the right breast. Electronically Signed   By: KUlyses JarredM.D.   On: 09/25/2016 04:12      Subjective:  She has no new complains. Discharge Exam: Vitals:   10/16/16 2124 10/17/16 0537  BP: 114/67 (!) 102/52  Pulse: 64 60  Resp: 20 16  Temp: 98.4 F (36.9 C) 98.4 F (36.9 C)   Vitals:   10/16/16 1501 10/16/16 1522 10/16/16 2124 10/17/16 0537  BP: (!) 88/47 (!) 105/59 114/67 (!) 102/52  Pulse: 74  64 60  Resp: 20  20 16   Temp: 98.3 F (36.8 C)  98.4 F (36.9 C) 98.4 F (36.9 C)  TempSrc: Oral  Oral Oral  SpO2: 100%  100% 97%  Weight:      Height:        General:In no acute distress Cardiovascular: regular rhythm with positive S1 Respiratory: Good air movement and clear to auscultation. Abdominal: Positive bowel sounds soft nontender nondistended Extremities: No edema.    The results of significant diagnostics from this hospitalization (including imaging, microbiology, ancillary and laboratory) are listed below for reference.     Microbiology: Recent Results (from the past 240 hour(s))  Culture, blood (routine x 2)     Status: Abnormal   Collection Time: 10/14/16  8:58 AM  Result Value Ref Range Status   Specimen Description BLOOD LEFT ANTECUBITAL  Final   Special Requests  Final    BOTTLES DRAWN AEROBIC AND ANAEROBIC Blood Culture adequate volume   Culture  Setup Time   Final    GRAM POSITIVE COCCI IN CLUSTERS ANAEROBIC BOTTLE ONLY CRITICAL RESULT CALLED TO, READ BACK BY AND VERIFIED WITH: M [place] AT 1000 ON [id number] BY SJW    Culture (A)  Final    STAPHYLOCOCCUS SPECIES (COAGULASE NEGATIVE) THE SIGNIFICANCE OF ISOLATING THIS ORGANISM FROM A SINGLE SET OF BLOOD CULTURES WHEN MULTIPLE SETS ARE DRAWN IS UNCERTAIN. PLEASE NOTIFY THE MICROBIOLOGY DEPARTMENT WITHIN ONE WEEK IF SPECIATION AND SENSITIVITIES ARE REQUIRED.    Report Status 10/17/2016 FINAL  Final  Blood Culture ID Panel (Reflexed)     Status:  Abnormal   Collection Time: 10/14/16  8:58 AM  Result Value Ref Range Status   Enterococcus species NOT DETECTED NOT DETECTED Final   Listeria monocytogenes NOT DETECTED NOT DETECTED Final   Staphylococcus species DETECTED (A) NOT DETECTED Final    Comment: Methicillin (oxacillin) susceptible coagulase negative staphylococcus. Possible blood culture contaminant (unless isolated from more than one blood culture draw or clinical case suggests pathogenicity). No antibiotic treatment is indicated for blood  culture contaminants. CRITICAL RESULT CALLED TO, READ BACK BY AND VERIFIED WITH: Hughie Closs PHARMD AT 1000 ON [id number] BY SJW    Staphylococcus aureus NOT DETECTED NOT DETECTED Final   Methicillin resistance NOT DETECTED NOT DETECTED Final   Streptococcus species NOT DETECTED NOT DETECTED Final   Streptococcus agalactiae NOT DETECTED NOT DETECTED Final   Streptococcus pneumoniae NOT DETECTED NOT DETECTED Final   Streptococcus pyogenes NOT DETECTED NOT DETECTED Final   Acinetobacter baumannii NOT DETECTED NOT DETECTED Final   Enterobacteriaceae species NOT DETECTED NOT DETECTED Final   Enterobacter cloacae complex NOT DETECTED NOT DETECTED Final   Escherichia coli NOT DETECTED NOT DETECTED Final   Klebsiella oxytoca NOT DETECTED NOT DETECTED Final   Klebsiella pneumoniae NOT DETECTED NOT DETECTED Final   Proteus species NOT DETECTED NOT DETECTED Final   Serratia marcescens NOT DETECTED NOT DETECTED Final   Haemophilus influenzae NOT DETECTED NOT DETECTED Final   Neisseria meningitidis NOT DETECTED NOT DETECTED Final   Pseudomonas aeruginosa NOT DETECTED NOT DETECTED Final   Candida albicans NOT DETECTED NOT DETECTED Final   Candida glabrata NOT DETECTED NOT DETECTED Final   Candida krusei NOT DETECTED NOT DETECTED Final   Candida parapsilosis NOT DETECTED NOT DETECTED Final   Candida tropicalis NOT DETECTED NOT DETECTED Final  Culture, blood (routine x 2)     Status: None (Preliminary  result)   Collection Time: 10/14/16  8:59 AM  Result Value Ref Range Status   Specimen Description BLOOD PICC LINE  Final   Special Requests   Final    BOTTLES DRAWN AEROBIC AND ANAEROBIC Blood Culture adequate volume   Culture NO GROWTH 2 DAYS  Final   Report Status PENDING  Incomplete     Labs: BNP (last 3 results) No results for input(s): BNP in the last 8760 hours. Basic Metabolic Panel:  Recent Labs Lab 10/11/16 0539 10/14/16 0757 10/15/16 0440 10/17/16 0423  NA 135 132* 137 136  K 3.9 3.5 3.8 3.9  CL 106 102 108 103  CO2 24 23 25 27   GLUCOSE 93 152* 112* 125*  BUN 21* 19 19 19   CREATININE 0.81 0.93 0.86 0.86  CALCIUM 8.7* 8.4* 8.3* 9.0   Liver Function Tests:  Recent Labs Lab 10/15/16 0440 10/17/16 0423  AST 41 41  ALT 41 46  ALKPHOS 51 54  BILITOT 0.3 0.2*  PROT 6.6 7.1  ALBUMIN 2.7* 2.9*   No results for input(s): LIPASE, AMYLASE in the last 168 hours. No results for input(s): AMMONIA in the last 168 hours. CBC:  Recent Labs Lab 10/11/16 0539 10/15/16 0440 10/17/16 0423  WBC 9.8 8.2 7.6  HGB 9.5* 8.6* 9.3*  HCT 30.7* 27.6* 30.0*  MCV 78.3 78.4 77.7*  PLT 313 232 273   Cardiac Enzymes: No results for input(s): CKTOTAL, CKMB, CKMBINDEX, TROPONINI in the last 168 hours. BNP: Invalid input(s): POCBNP CBG: No results for input(s): GLUCAP in the last 168 hours. D-Dimer No results for input(s): DDIMER in the last 72 hours. Hgb A1c No results for input(s): HGBA1C in the last 72 hours. Lipid Profile No results for input(s): CHOL, HDL, LDLCALC, TRIG, CHOLHDL, LDLDIRECT in the last 72 hours. Thyroid function studies No results for input(s): TSH, T4TOTAL, T3FREE, THYROIDAB in the last 72 hours.  Invalid input(s): FREET3 Anemia work up No results for input(s): VITAMINB12, FOLATE, FERRITIN, TIBC, IRON, RETICCTPCT in the last 72 hours. Urinalysis    Component Value Date/Time   COLORURINE YELLOW 10/14/2016 2009   APPEARANCEUR CLEAR 10/14/2016  2009   LABSPEC 1.032 (H) 10/14/2016 2009   PHURINE 6.0 10/14/2016 2009   GLUCOSEU >=500 (A) 10/14/2016 2009   HGBUR NEGATIVE 10/14/2016 2009   BILIRUBINUR NEGATIVE 10/14/2016 2009   KETONESUR NEGATIVE 10/14/2016 2009   PROTEINUR NEGATIVE 10/14/2016 2009   UROBILINOGEN 0.2 10/18/2014 0100   NITRITE NEGATIVE 10/14/2016 2009   LEUKOCYTESUR NEGATIVE 10/14/2016 2009   Sepsis Labs Invalid input(s): PROCALCITONIN,  WBC,  LACTICIDVEN Microbiology Recent Results (from the past 240 hour(s))  Culture, blood (routine x 2)     Status: Abnormal   Collection Time: 10/14/16  8:58 AM  Result Value Ref Range Status   Specimen Description BLOOD LEFT ANTECUBITAL  Final   Special Requests   Final    BOTTLES DRAWN AEROBIC AND ANAEROBIC Blood Culture adequate volume   Culture  Setup Time   Final    GRAM POSITIVE COCCI IN CLUSTERS ANAEROBIC BOTTLE ONLY CRITICAL RESULT CALLED TO, READ BACK BY AND VERIFIED WITH: M [place] AT 1000 ON [id number] BY SJW    Culture (A)  Final    STAPHYLOCOCCUS SPECIES (COAGULASE NEGATIVE) THE SIGNIFICANCE OF ISOLATING THIS ORGANISM FROM A SINGLE SET OF BLOOD CULTURES WHEN MULTIPLE SETS ARE DRAWN IS UNCERTAIN. PLEASE NOTIFY THE MICROBIOLOGY DEPARTMENT WITHIN ONE WEEK IF SPECIATION AND SENSITIVITIES ARE REQUIRED.    Report Status 10/17/2016 FINAL  Final  Blood Culture ID Panel (Reflexed)     Status: Abnormal   Collection Time: 10/14/16  8:58 AM  Result Value Ref Range Status   Enterococcus species NOT DETECTED NOT DETECTED Final   Listeria monocytogenes NOT DETECTED NOT DETECTED Final   Staphylococcus species DETECTED (A) NOT DETECTED Final    Comment: Methicillin (oxacillin) susceptible coagulase negative staphylococcus. Possible blood culture contaminant (unless isolated from more than one blood culture draw or clinical case suggests pathogenicity). No antibiotic treatment is indicated for blood  culture contaminants. CRITICAL RESULT CALLED TO, READ BACK BY AND VERIFIED  WITH: M MACCIA PHARMD AT 1000 ON [id number] BY SJW    Staphylococcus aureus NOT DETECTED NOT DETECTED Final   Methicillin resistance NOT DETECTED NOT DETECTED Final   Streptococcus species NOT DETECTED NOT DETECTED Final   Streptococcus agalactiae NOT DETECTED NOT DETECTED Final   Streptococcus pneumoniae NOT DETECTED NOT DETECTED Final   Streptococcus pyogenes NOT DETECTED NOT DETECTED Final   Acinetobacter  baumannii NOT DETECTED NOT DETECTED Final   Enterobacteriaceae species NOT DETECTED NOT DETECTED Final   Enterobacter cloacae complex NOT DETECTED NOT DETECTED Final   Escherichia coli NOT DETECTED NOT DETECTED Final   Klebsiella oxytoca NOT DETECTED NOT DETECTED Final   Klebsiella pneumoniae NOT DETECTED NOT DETECTED Final   Proteus species NOT DETECTED NOT DETECTED Final   Serratia marcescens NOT DETECTED NOT DETECTED Final   Haemophilus influenzae NOT DETECTED NOT DETECTED Final   Neisseria meningitidis NOT DETECTED NOT DETECTED Final   Pseudomonas aeruginosa NOT DETECTED NOT DETECTED Final   Candida albicans NOT DETECTED NOT DETECTED Final   Candida glabrata NOT DETECTED NOT DETECTED Final   Candida krusei NOT DETECTED NOT DETECTED Final   Candida parapsilosis NOT DETECTED NOT DETECTED Final   Candida tropicalis NOT DETECTED NOT DETECTED Final  Culture, blood (routine x 2)     Status: None (Preliminary result)   Collection Time: 10/14/16  8:59 AM  Result Value Ref Range Status   Specimen Description BLOOD PICC LINE  Final   Special Requests   Final    BOTTLES DRAWN AEROBIC AND ANAEROBIC Blood Culture adequate volume   Culture NO GROWTH 2 DAYS  Final   Report Status PENDING  Incomplete     Time coordinating discharge: Over 30 minutes  SIGNED:   Charlynne Cousins, MD  [facility] 10/17/2016, 10:04 AM Pager   If 7PM-7AM, please contact night-coverage [email or web address] Password [email or web address]

## 2016-10-17 NOTE — Care Management Note (Addendum)
Case Management Note Previous Note Created by : Marvetta Gibbons RN, BSN Unit 2W-Case Manager [phone]  Patient Details  Name: [name] MRN: [id number] Date of Birth: [birth year]  Subjective/Objective:  Pt admitted  Septic arthritis, sternoclavicular joint s/p I&D on 5/24- with wound VAC placed-hx of IVDU                Action/Plan: PTA pt lived at home- on last admit was set up with [facility]- pt has also been sent to SNF in past for IV abx needs- If pt is to need long term iv abx again- will need to look at SNF option- with IVDU hx not a candidate for PICC at home- will also need wound VAC for d/c. CM and CSW to follow for d/c needs  Expected Discharge Date:  10/17/16               Expected Discharge Plan:     In-House Referral:     Discharge planning Services     Post Acute Care Choice:    Choice offered to:     DME Arranged:    DME Agency:     HH Arranged:    [place] Agency:     Status of Service:     If discussed at [employer] of Stay Meetings, dates discussed:  6/5  Discharge Disposition:   Additional Comments: 10/17/2016  Bedside nurse to contact [place] regarding suboxone clinics  Pt to discharge home today on PO antibiotics.  HH/DME not ordered and not deemed necessary by bedside nurse nor pt.  Pt alert and oriented - states her father will transport her home - pt also states her father lives with her.  Pt states she has appt set up with IM clinic and denied barriers to obtaining medications as prescribed.  CM also placed [facility] information on AVS as another option to help assist pt with locating PCP.    09/14/16- 0900- Marvetta Gibbons RN,CM- discussed pt this am with DR. Reynaldo Minium in [employer]- per discussion pt is not an LTAC or SNF candidate due to ongoing drug use while here in hospital- per DR. A- pt will need to stay in house to complete IV abx course- unless able to d/c on oral abx. Attending MD aware.   09/13/16- 1500- Kristi Webster RN, CM- per notes by CM last week- pt  was [facility] to [place] [facility] and was to review case again today- have spoken with [place]- who after review of pt's chart today -states that pt is not a candidate for [facility] and they will not be making bed offer (secondary to pt's high risk behavior) - have notified MD- and have placed call to Dr. Reynaldo Minium- medical director- for d/c plan direction-awaiting return call.   Maryclare Labrador, RN 10/17/2016, 10:17 AM

## 2016-10-18 NOTE — Progress Notes (Signed)
  Date: 10/18/2016  Patient name: JASIAH BUNTIN  Medical record number: [id number]  Date of birth: Sep 06, 1970   I saw Ms. Gaetano today in anticipation of discharge.  She is doing very well, appears more calm.  Reports cravings are mild only and the buprenorphine-naloxone is helping control these.  She is concerned to stop the medication as she knows the cravings will return and she was relieved to receive a prescription.  She believes her insurance will pay for this medication, but will call if she has issues.    We reiterated the plan together, to maintain sobriety she will stay on buprenorphine-naloxone 8-2mg  film twice a day.  She will follow up in our clinic in 8 days, Tuesday 7/17.  She will see psychiatry if we advise so.    I provided her with a written Rx for Buprenorphine-naloxone SL films BID #16.    Follow up information placed in her discharge papers.  I communicated with the [facility] staff and other faculty who will be involved with her care.    Gilles Chiquito, MD

## 2016-10-18 NOTE — Progress Notes (Signed)
Patient seen today and examined. Patient to go home with suboxone. No new changes. Patient stable for discharge.  Cordelia Poche, MD [facility] 10/18/2016, 3:37 PM Pager: [phone]

## 2016-10-18 NOTE — Progress Notes (Signed)
Pt discharged to home. Discussed AVS, VSS, no skin issues, medication Rxs in hand. Pt had no questions.

## 2016-10-19 LAB — CULTURE, BLOOD (ROUTINE X 2)
Culture: NO GROWTH
Special Requests: ADEQUATE

## 2016-10-26 ENCOUNTER — Ambulatory Visit (INDEPENDENT_AMBULATORY_CARE_PROVIDER_SITE_OTHER): Payer: Medicare Other | Admitting: Internal Medicine

## 2016-10-26 VITALS — BP 125/69

## 2016-10-26 DIAGNOSIS — F1120 Opioid dependence, uncomplicated: Secondary | ICD-10-CM | POA: Diagnosis not present

## 2016-10-26 DIAGNOSIS — F17210 Nicotine dependence, cigarettes, uncomplicated: Secondary | ICD-10-CM | POA: Diagnosis not present

## 2016-10-26 DIAGNOSIS — F1910 Other psychoactive substance abuse, uncomplicated: Secondary | ICD-10-CM

## 2016-10-26 DIAGNOSIS — Z79891 Long term (current) use of opiate analgesic: Secondary | ICD-10-CM

## 2016-10-26 MED ORDER — BUPRENORPHINE HCL-NALOXONE HCL 8-2 MG SL SUBL: 1.0000 | SUBLINGUAL_TABLET | Freq: Two times a day (BID) | SUBLINGUAL | 0 refills | Status: DC

## 2016-10-26 NOTE — Progress Notes (Signed)
10/26/2016  [name] presents for buprenorphine/naloxone intake visit.  I have reviewed the chart including labwork which was available in Epic.  The salient points were confirmed with the patient.    Review of substance use history (first use, substances used, any illicit purchases): Started using heroin about 5 years ago.  She began using crushed opana and then progressed to heroin.  She has had issues with withdrawal multiple times in the past.  She has been successful with MAT in the past with suboxone, however, due to a family death, she began using heroin again.  She was recently admitted to the hospital for septic sternoclavicular joint. She has had other complications of her continued use of heroin.  Mostly she misses spending time with her family because she does not want to be around her grandson while on heroin.    I saw her in the hospital at the end of June and earlier this month and she was initiated on suboxone 8-2mg  BID and was doing well initially on this medication.  However, in the course of a move, she lost a 3 day supply of her suboxone.  She does not know what happened to it but fears her dog may have eaten it.  To avoid withdrawal she went back to taking heroin, last dosed yesterday.  She is very motivated to be on suboxone and reports only taking the heroin to avoid withdrawal.    Last substance used: Heroin, yesterday.   Mental Health History: Has been seen in mental health in the past, but does not currently see anyone.  When she went back on heroin she stopped seeing any providers.   Current counseling/behavioural health provider: None  This patient has Opioid Use Disorder by following DSM-V criteria: Keep those that apply.  - Persistent desire to cut down - Cravings to use opioids - Continue opioid use despite persistent social or interpersonal problems - Important activities are given up or reduced because of opioid use - Use despite knowledge of health  problems caused by opioids - Tolerance - Withdrawal  Past Medical History:  Diagnosis Date  . Anxiety   . Chronic back pain   . Chronic knee pain   . COPD (chronic obstructive pulmonary disease) ([place])   . DDD (degenerative disc disease)   . Hepatitis C infection 07/13/2014  . IV drug abuse   . Nephrolithiasis   . Panic attacks   . Polysubstance abuse    cocaine, opiates, marijuana, amphetamines, "molly," crystal meth  . Pulmonary embolism [facility])     Current Outpatient Prescriptions on File Prior to Visit  Medication Sig Dispense Refill  . linezolid (ZYVOX) 600 MG tablet Take 1 tablet (600 mg total) by mouth every 12 (twelve) hours. 28 tablet 0   No current facility-administered medications on file prior to visit.     Physical Exam  Vitals:   10/26/16 1126  BP: 125/69    Clinical Opiate Withdrawal Scale: bold applicable COWS scoring   - Resting HR:    - 0 for < 80   - 1 for 81 - 100   - 2 for 101 - 120   - 4 for > 120  - Sweating:   - 0 for no chills/flushing   - 1 for subjective chills/flushing   - 3 for beads of sweat on brow/face   - 4 for sweat streaming off of face  - Restlessness:    - 0 for able to sit still   -  1 for subjective difficulty sitting still   - 3 for frequent shifting or extraneous movement   - 5 for unable to sit still for more than a few seconds  - Pupil size:    - 0 for pinpoint or normal   - 1 for possibly larger than normal   - 2 for moderately dilated   - 5 for only iris rim visible  - Bone/joint pain:    - 0 for not present   - 1 for mild diffuse discomfort   - 2 severe diffuse aching   - 4 for objectively rubbing joints/muscles and obviously in pain  - Runny nose/tearing:    - 0 for not present   - 1 for stuffy nose/moist eyes   - 2 for nose running/tearing   - 4 for nose constantly running or tears streaming down cheeks  - GI Upset:    - 0 for no GI symptoms   - 1 for stomach cramps   - 2 for nausea or loose stool   - 3  for vomiting or diarrhea   - 5 for multiple episodes of vomiting or diarrhea  - Tremor observation of outstretched hands:    - 0 for no tremor   - 1 for tremor can be felt but not observed   - 2 for slight tremor observable   - 4 for gross tremor or muscle twitching  - Yawning:    - 0 for no yawning   - 1 for yawning once or twice during assessment   - 2 for yawning three or more times during assessment   - 4 for yawning several times per minute  - Anxiety or irritability:    - 0 for none   - 1 for patient reports increasing irritability or anxiousness   - 2 for patient obviously irritable/anxious   - 4 for patient so irritable/anxious that assessment is difficult  - Gooseflesh:    - 0 for skin is smooth   - 3 for piloerection of skin can be felt or seen   - 5 for prominent piloerection  TOTAL: 6 (mild withdrawal).  Please see problem based assessment and plan in the encounter tab.     Sid Falcon, MD 10/26/2016 3:07 PM

## 2016-10-26 NOTE — Patient Instructions (Signed)
[name] - -  It was a pleasure to see you today!  Please keep your medications in a safe place, away from your dogs and children.    Please continue taking 1 film twice per day.    Please re-induce (instructions below) tonight with the Suboxone.   Thank you!  Come back to see [place] in 1 week, you will see Dr. Evette Doffing.   Instruction for starting buprenorphine-naloxone (Suboxone) at home  You should not mix buprenorphine-naloxone with other drugs especially large amounts of alcohol or benzodiazepines (Valium, Klonopin, Xanax, Ativan). If you have taken any of these medication, please tell your healthcare team and do not take buprenorphine-naloxone.   You must wait until you are feeling signs of withdrawal from opiates (heroin, pain pills) before you take buprenorphine-naloxone.  If you do not wait long enough the medication will make you sicker.  If you do take it too soon and get sicker then wait until later when you feel signs of withdrawal listed below and then try again.   Signs that you are withdrawing: ? Anxiety, restlessness, can't sit still ? Aches ? Nausea or sick to your stomach ? Goose-bumps ? Racing heart   You should have ALL of these symptoms before you start taking your first dose of buprenorphine-naloxone. If you are not sure call your healthcare team.    When it's time to take your first dose 1. Split your pill or film in half 2. Make sure your mouth is empty of everything (no candy/gum/etc) 3. Sit or stand, but do not lie down 4. Swallow a sip of water to wet your mouth  5. Put the half of the tablet or film under your tongue. Do not suck or swallow it. It must stay there until it is completely dissolved. Try to not even swallow your spit during this time. Anything that you swallow will not make you feel better.   In 20 minutes: You should start feeling a little better. If you feel worse then you started too early so you would wait a few hours and then try again later.     In one hour: You can take the other half of the pill or film the same way you took the first one.   In 2 hours: if you are still feeling symptoms of withdrawal listed above you can take another half a pill or film. You can repeat this if needed until you take a total of 2 pills or 2 films (16mg ). You may need less than this to control your symptoms.  You should adjust your dose so that you are taking one and half or two pills or films per day (12-16mg  per day). At this dose you should have cut down on cravings and help with any withdrawal symptoms.   The next day:  In the morning you can take the same amount you took yesterday all at one time in the morning.  Expect a call from your team to see how you are doing.   If you have any questions or concerns at any time call your healthcare team.  Clinic Number:  [place]: [phone] After Hours Number: [phone] - - Leave your number and expect a call back from a physician.

## 2016-10-26 NOTE — Assessment & Plan Note (Addendum)
Based on a review of the patient's medical history including substance use and mental health factors, and physical exam, [name] is a suitable candidate for MAT with buprenorphine/naloxone.  She meets criteria for severe OUD.  She reports that she has reverted back to using heroin due to losing 3 day supply of suboxone.  She is very willing to be in therapy and would prefer to be on suboxone over using IV heroin.    She has had multiple complications due to her heroin use, most recently septic sternoclavicular joint.  She has not been able to get her Linezolid, but is supposed to get it tomorrow.  Her insurance pays for her suboxone for her.    UDS ordered this visit.   HIV was negative in May.  She is HCV positive.  She had normal LFTs last month.     In future, might consider referral to [facility] for HCV treatment if she is interested.  She should also establish with our clinic for PCP care if she is interested.  She lives ~ 1 hour away, so she may prefer a PCP closer to home.      Home Induction:   I have instructed the patient how to appropriately take this medication, including placing under the tongue with head relaxed for 10 minutes and allowing to dissolve without chewing or swallowing tab/film, and with nothing to eat or drink in the subsequent 15 minutes.  They have been told not to start taking the medication until they have significant signs of withdrawal and I have explained the concept of precipitated withdrawal with the patient.  I advised her to take her first film tonight.   Intervisit Care:  Continue Suboxone 8-2mg  BID SL.  Given a 7 day supply.    We discussed this medication must be kept in a safe place and away from children and animals.   We will see the patient back in 1 week in clinic, with options for a sooner appointment based on patient and provider preference.   Patient was encouraged to call the office and speak with the MD on call for any urgent concerns.

## 2016-11-02 ENCOUNTER — Telehealth: Payer: Self-pay | Admitting: *Deleted

## 2016-11-02 ENCOUNTER — Ambulatory Visit (INDEPENDENT_AMBULATORY_CARE_PROVIDER_SITE_OTHER): Payer: Medicare Other | Admitting: Student in an Organized Health Care Education/Training Program

## 2016-11-02 VITALS — BP 130/70 | HR 87 | Temp 98.3°F | Ht 61.0 in | Wt 183.5 lb

## 2016-11-02 DIAGNOSIS — F1120 Opioid dependence, uncomplicated: Secondary | ICD-10-CM | POA: Diagnosis present

## 2016-11-02 DIAGNOSIS — B182 Chronic viral hepatitis C: Secondary | ICD-10-CM | POA: Diagnosis not present

## 2016-11-02 DIAGNOSIS — M00011 Staphylococcal arthritis, right shoulder: Secondary | ICD-10-CM | POA: Diagnosis not present

## 2016-11-02 DIAGNOSIS — Z8261 Family history of arthritis: Secondary | ICD-10-CM

## 2016-11-02 DIAGNOSIS — B9562 Methicillin resistant Staphylococcus aureus infection as the cause of diseases classified elsewhere: Secondary | ICD-10-CM | POA: Diagnosis not present

## 2016-11-02 DIAGNOSIS — Z79891 Long term (current) use of opiate analgesic: Secondary | ICD-10-CM

## 2016-11-02 DIAGNOSIS — F17210 Nicotine dependence, cigarettes, uncomplicated: Secondary | ICD-10-CM

## 2016-11-02 DIAGNOSIS — M009 Pyogenic arthritis, unspecified: Secondary | ICD-10-CM

## 2016-11-02 MED ORDER — DOXYCYCLINE HYCLATE 100 MG PO TABS: 100.0000 mg | ORAL_TABLET | Freq: Two times a day (BID) | ORAL | 0 refills | Status: AC

## 2016-11-02 MED ORDER — BUPRENORPHINE HCL-NALOXONE HCL 8-2 MG SL SUBL: 1.0000 | SUBLINGUAL_TABLET | Freq: Two times a day (BID) | SUBLINGUAL | 0 refills | Status: DC

## 2016-11-02 NOTE — Assessment & Plan Note (Signed)
Severe opioid use disorder on medication assisted therapy with Suboxone since 09/29/16. Currently doing fairly well, withdrawal symptoms are well controlled, cravings also well controlled.  she relapsed again last week, used heroin once about 5 days ago. Says the trigger for this was her best friend who is also a heroin user. I provided the patient with counseling, we talked about methods to avoid triggers and situations around other opioid users. Patient has good insight into her addiction and a strong desire to continue with treatment. Plan is to continue with Suboxone 16 mg daily, I provided her with a one-week supply. I reviewed the control database which shows appropriate dispensing. Unfortunately our tox screen from last week's visit has not been resulted yet. We did collect another tox screen today. Patient is insured with Medicare, Suboxone, pain is $3. Follow-up is in one week on Tuesday morning.

## 2016-11-02 NOTE — Progress Notes (Signed)
   11/02/2016  [name] presents for follow up of opioid use disorder I have reviewed the prior induction visit, follow up visits, and telephone encounters relevant to opiate use disorder (OUD) treatment.   Current daily dose: Suboxone 16mg    Date of Induction: 09/29/2016  Current follow up interval, in weeks: 1  The patient has been adherent to the buprenorphine for OUD contract:   Last UDS Result: Not yet resulted  HPI:  46 year old woman here for follow-up of severe opioid use disorder. 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 She completed 4 weeks of IV vancomycin in-house. She was discharged to start with nasal it for an additional 2 weeks, however unfortunate she was unable to obtain this medication after discharge because the co-pay was $1000. Initially she was doing well but now reports that she's having increasing swelling, redness, and warmth from the wound site on her upper right chest. She reports doing well on the Suboxone. Takes 2 tablets daily sublingually. Reports that it has kept her out of withdrawal, also does not have significant cravings. However she did use heroin once last week, about 5 days ago. She says she used to because her best friend is a heroin user and brought over to her house. Denies any fevers or chills. No chest pain or shortness of breath. Eating and drinking well. Anxiety and other mood symptoms are stable.  Exam:   Vitals:   11/02/16 1324  BP: 130/70  Pulse: 87  Temp: 98.3 F (36.8 C)  TempSrc: Oral  SpO2: 98%  Weight: 183 lb 8 oz (83.2 kg)  Height: 5\' 1"  (1.549 m)    Gen: Well appearing, sitting up in the chair, conversational. Skin: Right upper chest wall with a well healed wound, no drainage, however there is surrounding rubor and warmth, no fluctuance, mildly tender over the joint space.  CV: RRR, no murmurs Lungs: clear  throughout, normal work of breathing Ext: warm, trace bilateral pitting edema.   Assessment/Plan:  See Problem Based Charting in the Encounters Tab  Follow up interval: every 1 weeks   Axel Filler, MD  11/02/2016  2:05 PM

## 2016-11-02 NOTE — Telephone Encounter (Signed)
Discussed with DrVincent-he can see patient today @ 1:30pm-pt aware.Julie Hidden Cassady7/24/20189:53 AM

## 2016-11-02 NOTE — Patient Instructions (Signed)
1. Start taking Doxycycline for your skin infection. Call us if you have any problems with this medicine.   2. Continue the suboxone at your usual dose. We will see you in one week on Tuesday morning for refills.

## 2016-11-02 NOTE — Assessment & Plan Note (Signed)
Patient completed 4 weeks of IV vancomycin in the hospital, at discharge she was supposed to start 2 weeks of linezolid nasal lid however the co-pay was $1000 and she was unable to fill this. She's been off antibiotics for about 10 days now. Area on her chest has increased redness and swelling, tenderness, but no drainage. I reviewed the microbiology which shows MRSA sensitive to tetracyclines and Bactrim as well. Because we cannot use the nasal lid because of cost, I have prescribed doxycycline 100 mg twice a day for 2 weeks. Follow-up with [place] in 1 week, call if worsening symptoms.

## 2016-11-02 NOTE — Assessment & Plan Note (Signed)
Genotype 1A, diagnosed at last hospitalization. Patient has a follow-up appointment with infectious disease on 8/2 and is interested in treatment. She asked me if she was contagious to other people, and I gave her the usual counseling about avoiding blood to blood contact.
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
## 2016-11-04 LAB — TOXASSURE SELECT,+ANTIDEPR,UR

## 2016-11-10 ENCOUNTER — Telehealth: Payer: Self-pay

## 2016-11-10 MED ORDER — BUPRENORPHINE HCL-NALOXONE HCL 8-2 MG SL SUBL: 1.0000 | SUBLINGUAL_TABLET | Freq: Two times a day (BID) | SUBLINGUAL | 0 refills | Status: DC

## 2016-11-10 NOTE — Telephone Encounter (Signed)
Ione may phone in this refill, #5 films. Thanks.

## 2016-11-10 NOTE — Telephone Encounter (Signed)
Requesting Suboxone to be filled.

## 2016-11-10 NOTE — Telephone Encounter (Signed)
Phoned to pharm and informed pt

## 2016-11-11 ENCOUNTER — Ambulatory Visit: Payer: Self-pay | Admitting: Internal Medicine

## 2016-11-12 ENCOUNTER — Ambulatory Visit: Payer: Self-pay

## 2016-11-15 LAB — TOXASSURE SELECT,+ANTIDEPR,UR

## 2016-11-17 ENCOUNTER — Telehealth: Payer: Self-pay | Admitting: *Deleted

## 2016-11-17 MED ORDER — BUPRENORPHINE HCL-NALOXONE HCL 8-2 MG SL SUBL: 1.0000 | SUBLINGUAL_TABLET | Freq: Two times a day (BID) | SUBLINGUAL | 0 refills | Status: DC

## 2016-11-17 NOTE — Telephone Encounter (Signed)
That sounds unfortunate. So she has no showed with [place] on 7/31 and on 8/3. I will approve a one week supply of suboxone to be called in. However, this is her last chance, we need to her to come to clinic on 8/14 to receive further suboxone.
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
## 2016-11-18 NOTE — Telephone Encounter (Signed)
Spoke w/ pt, stressed that she must come to clinic 8/14, called med to [facility]

## 2016-11-23 ENCOUNTER — Ambulatory Visit (INDEPENDENT_AMBULATORY_CARE_PROVIDER_SITE_OTHER): Payer: Medicare Other | Admitting: Internal Medicine

## 2016-11-23 ENCOUNTER — Encounter: Payer: Self-pay | Admitting: Internal Medicine

## 2016-11-23 VITALS — BP 133/71 | HR 46 | Temp 97.6°F | Ht 61.0 in | Wt 171.1 lb

## 2016-11-23 DIAGNOSIS — F1120 Opioid dependence, uncomplicated: Secondary | ICD-10-CM

## 2016-11-23 DIAGNOSIS — Z8619 Personal history of other infectious and parasitic diseases: Secondary | ICD-10-CM | POA: Diagnosis not present

## 2016-11-23 DIAGNOSIS — L02413 Cutaneous abscess of right upper limb: Secondary | ICD-10-CM

## 2016-11-23 DIAGNOSIS — F17210 Nicotine dependence, cigarettes, uncomplicated: Secondary | ICD-10-CM | POA: Diagnosis not present

## 2016-11-23 MED ORDER — BUPRENORPHINE HCL-NALOXONE HCL 8-2 MG SL SUBL: 1.0000 | SUBLINGUAL_TABLET | Freq: Two times a day (BID) | SUBLINGUAL | 0 refills | Status: DC
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
## 2016-11-23 NOTE — Assessment & Plan Note (Addendum)
Patient with abscess on right forearm ~4 days duration which patient expressed; it is no longer draining and has minimal surrounding erythema and induration. Patient had positive blood culture for MSSA in July and continues to take doxycycline. She denies current constitutional symptoms and overall feels well; she is afebrile with stable vital signs.   Plan: --patient to complete her course of doxycyline (2 more days) --will f/u next week, if still significant and having other symptoms concerning for bacteremia, then we will draw blood cultures and re-address treatment.

## 2016-11-23 NOTE — Assessment & Plan Note (Signed)
Patient with severe opioid use disorder currently on maintenance therapy with twice daily buprenorphine-naloxone 8-2mg  sl tabs. Patient relapsed last about 5 days ago with crack cocaine and heroin, however maintains abstinence since. Her withdrawal symptoms and cravings are well controlled on current regimen.  Plan: --f/u [facility] --refill buprenorphine-naloxone 8-2mg  sl tabs BID #14 --f/u in 1 week
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
## 2016-11-23 NOTE — Patient Instructions (Signed)
Take one suboxone tablet under your tongue twice a day. Let it completely dissolve under your tongue.  The ID clinic phone number is [phone]. Please call them to set up an appointment so you can start treatment for your hepatitis.   If you have any fevers, chills, please come into the clinic to get evaluated.

## 2016-11-27 LAB — TOXASSURE SELECT,+ANTIDEPR,UR

## 2016-11-30 ENCOUNTER — Encounter: Payer: Self-pay | Admitting: Internal Medicine

## 2016-11-30 ENCOUNTER — Ambulatory Visit (INDEPENDENT_AMBULATORY_CARE_PROVIDER_SITE_OTHER): Payer: Medicare Other | Admitting: Internal Medicine

## 2016-11-30 VITALS — BP 108/62 | HR 70 | Temp 97.9°F | Ht 61.0 in | Wt 178.4 lb

## 2016-11-30 DIAGNOSIS — M79605 Pain in left leg: Secondary | ICD-10-CM

## 2016-11-30 DIAGNOSIS — M79604 Pain in right leg: Secondary | ICD-10-CM | POA: Diagnosis not present

## 2016-11-30 DIAGNOSIS — M7989 Other specified soft tissue disorders: Secondary | ICD-10-CM

## 2016-11-30 DIAGNOSIS — F1120 Opioid dependence, uncomplicated: Secondary | ICD-10-CM

## 2016-11-30 DIAGNOSIS — R011 Cardiac murmur, unspecified: Secondary | ICD-10-CM

## 2016-11-30 DIAGNOSIS — F17210 Nicotine dependence, cigarettes, uncomplicated: Secondary | ICD-10-CM

## 2016-11-30 DIAGNOSIS — Z8739 Personal history of other diseases of the musculoskeletal system and connective tissue: Secondary | ICD-10-CM | POA: Diagnosis not present

## 2016-11-30 DIAGNOSIS — R600 Localized edema: Secondary | ICD-10-CM

## 2016-11-30 DIAGNOSIS — Z608 Other problems related to social environment: Secondary | ICD-10-CM | POA: Diagnosis not present

## 2016-11-30 MED ORDER — BUPRENORPHINE HCL-NALOXONE HCL 8-2 MG SL SUBL: 1.0000 | SUBLINGUAL_TABLET | Freq: Two times a day (BID) | SUBLINGUAL | 0 refills | Status: DC

## 2016-11-30 MED ORDER — BUPRENORPHINE HCL-NALOXONE HCL 8-2 MG SL SUBL: SUBLINGUAL_TABLET | SUBLINGUAL | 0 refills | Status: DC

## 2016-11-30 MED ORDER — FUROSEMIDE 40 MG PO TABS: 40.0000 mg | ORAL_TABLET | Freq: Every day | ORAL | 2 refills | Status: DC | PRN

## 2016-11-30 NOTE — Patient Instructions (Addendum)
For smoking, please try nicotine patches, more information below.   Before using the patches, set a quit date.  Try to cut back smoking every day while using the patches.   Please come back to see [place] in 1 week.
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
## 2016-12-02 DIAGNOSIS — M7989 Other specified soft tissue disorders: Secondary | ICD-10-CM | POA: Insufficient documentation

## 2016-12-02 NOTE — Addendum Note (Signed)
Addendum  created 12/02/16 1509 by Roberts Gaudy, MD   Sign clinical note

## 2016-12-02 NOTE — Assessment & Plan Note (Signed)
We discussed cessation today and I discussed with her the nature of behavior change and how this might be a difficult habit to break.  We discussed nicotine replacement and she was interested in this modality.  I provided her with a sample of NRT at 21mg  daily patches and discussed the need to set a quit date and avoid situations where she would feel an increased urge to smoke.    NRT Return to clinic in 1 week.

## 2016-12-02 NOTE — Assessment & Plan Note (Signed)
She reports doing well; no further illicit substance use despite opportunity per her.  UDS sent today for confirmation.  She has been taking extra suboxone for withdrawal symptoms at night.  She is not having trouble affording her medications.  I continued to advise her to avoid situations where drug use is present and she intends to stay in [facility] to avoid this situation.    Plan Increase Suboxone to 2 films BID + 1/2 film at night.  Prescribed 18 films for a 1 week supply Return in 1 week Follow up UDS

## 2016-12-02 NOTE — Assessment & Plan Note (Signed)
I did not see a significant amount of swelling on exam.  She had a TTE in 2016 which did not show any systolic dysfunction, however, based on symptoms I do not have a strong feeling that this is related to uncontrolled heart failure.  She has normal renal function and liver function.   Plan Assign patient to PCP in our clinic Lasix 40mg  PRN Further work up if needed, consider UA and repeat TTE if persistent.

## 2016-12-04 LAB — TOXASSURE SELECT,+ANTIDEPR,UR

## 2016-12-07 ENCOUNTER — Ambulatory Visit (INDEPENDENT_AMBULATORY_CARE_PROVIDER_SITE_OTHER): Payer: Medicare Other | Admitting: Internal Medicine

## 2016-12-07 ENCOUNTER — Encounter: Payer: Self-pay | Admitting: Internal Medicine

## 2016-12-07 VITALS — BP 117/83 | HR 68 | Temp 97.9°F | Resp 20 | Wt 180.2 lb

## 2016-12-07 DIAGNOSIS — M7989 Other specified soft tissue disorders: Secondary | ICD-10-CM | POA: Diagnosis not present

## 2016-12-07 DIAGNOSIS — F1120 Opioid dependence, uncomplicated: Secondary | ICD-10-CM

## 2016-12-07 DIAGNOSIS — F1990 Other psychoactive substance use, unspecified, uncomplicated: Secondary | ICD-10-CM | POA: Diagnosis not present

## 2016-12-07 DIAGNOSIS — F17210 Nicotine dependence, cigarettes, uncomplicated: Secondary | ICD-10-CM

## 2016-12-07 DIAGNOSIS — Z79891 Long term (current) use of opiate analgesic: Secondary | ICD-10-CM | POA: Diagnosis not present

## 2016-12-07 MED ORDER — BUPRENORPHINE HCL-NALOXONE HCL 8-2 MG SL SUBL: SUBLINGUAL_TABLET | SUBLINGUAL | 0 refills | Status: DC

## 2016-12-07 NOTE — Assessment & Plan Note (Signed)
Patient is not ready yet to quit smoking. She was provided with nicotine patches last week and advised to supplement with nicotine gum when she does decide to quit. Will continue addressing at future appointments.

## 2016-12-07 NOTE — Progress Notes (Signed)
   12/07/2016  [name] presents for follow up of opioid use disorder I have reviewed the prior induction visit, follow up visits, and telephone encounters relevant to opiate use disorder (OUD) treatment.   Current daily dose: 20mg  buprenorphine daily  Date of Induction: 09/29/2016  Current follow up interval, in weeks: 1wk  The patient has been adherent with the buprenorphine for OUD contract, with some indiscretions.   Last UDS Result: Appropriate for buprenorphine and metabolite; inappropriate presence of methamphetamine, cocaine metabolite, morphine/fentanyl +metabolites consistent with heroin use, tramadol and metabolites.  HPI:  Ms. Bouvier is a 46yo female presenting to clinic for f/u on her opioid use disorder. Her current dose is 20mg  buprenorphine daily, which was increased last week from 16mg  due to nightly cravings. On reviewing her last UDS results, patient states that last week she did use heroin, but denies cocaine or methamphetamine use. She states that she has since weaned herself back onto suboxone and feels she is out of withdrawal currently. She states that she is still going back and forth between [facility] and [place] with her father so has intermittent contact with prior environment which is saturated with drug use.   She states that she has not started using the nicotine patches yet as she is not ready to quit smoking.   She endorses congestion and mild nonproductive cough; she denies fevers. She endorses that her multiple dogs have flees and she has flee bites; she has 2 new nodules (one on dorsal side of forearm, and one on flexor aspect of left wrist); she denies that these are painful and they are not draining. Patient was encouraged again to make a f/u appointment with ID due to her history of bacteremia; she has completed her antibiotic regimen.  She states that she has taken lasix about 3 times over the last week with good results in decreasing her LE  edema.  Exam:   Vitals:   12/07/16 0953  BP: 117/83  Pulse: 68  Resp: 20  Temp: 97.9 F (36.6 C)  TempSrc: Oral  SpO2: 96%  Weight: 180 lb 3.2 oz (81.7 kg)   Constitutional: NAD, appears fatigued, yawned a couple of times during encounter HEENT: sounds congested, has consistent eye drainage CV: RRR, +Murmur, no gallops or rubs, pulses intact Resp: CTAB, no increased work of breathing Ext: minimal LE edema; ~3x2cm non fluctuant, nonerythematous nodule on dorsal surface of right forearm; 0.5x0.5cm mildly erythematous  Assessment/Plan:  See Problem Based Charting in the Encounters Tab   Alphonzo Grieve, MD  12/07/2016  9:56 AM

## 2016-12-07 NOTE — Patient Instructions (Signed)
Infectious disease office:  [address] [place],  [address]  Phone: [phone]  Please continue your current dose of buprenorphine.  Please call the infectious disease office to set up an appointment with them.  We will see you in 1 week.

## 2016-12-07 NOTE — Assessment & Plan Note (Addendum)
Patient with severe opioid use disorder who had a relapse last week with heroin; she denies cocaine and methamphetamine use which were present in her urine. Her current buprenorphine dose is 20mg  daily and she states she has titrated herself back up to this dose. COWS score 7 this morning. She denies further illicit drug use and claims today's urine will be appropriate.  Dr. Daryll Drown and patient had discussion on pursuing counseling/NA, however patient is currently not interested.   Plan: --f/u UDS --refill buprenorphine-naloxone 8-2mg , 20mg  daily #18 --continue addressing counseling at f/u appointments --f/u in one week

## 2016-12-07 NOTE — Assessment & Plan Note (Signed)
Leg swelling well controlled on PRN lasix.  Plan: --continue current therapy

## 2016-12-08 NOTE — Progress Notes (Signed)
Internal Medicine Clinic Attending  I saw and evaluated the patient.  I personally confirmed the key portions of the history and exam documented by Dr. Jari Favre and I reviewed pertinent patient test results.  The assessment, diagnosis, and plan were formulated together and I agree with the documentation in the resident's note.  [name] has severe opioid use disorder with ongoing heroin use.  She presents today and reports not using any illicit substances for the last week, however, her UDS from last week was in appropriate and she admits to using heroin.  She has had a difficult time due to social issues, long term history of use and some cravings which have not been completely controlled with suboxone.  Her family lives in an area which has a high drug use and she has a large social group which is actively using substances.  She states that she is not sure if she knows anyone except family who does not use.  She has moved to [facility] which has helped, but given her father's illness, she has been back to her regular environment.  She does have a friend who was recently in the hospital who has asked her to come stay with him to help him recuperate.  He is not a drug user.  We talked about coping mechanisms for avoiding social situations where drugs are used and cultivating new friendships, however, she seems overwhelmed by this.  I think she would strongly benefit from NA meetings (she has been to [place] meetings in the past) and/or psychiatric follow up for help with coping mechanisms.  She seemed interested, but currently does not have a car or transportation.  She is aware of NA meetings in [place] and will seek them out when she can.  Given her cravings which are a barrier to her improvement, her Suboxone was increased to 2.5 films per day.  I think she is high risk for continued use and may need a higher level of care in the future, though I would continue working with her at this point.

## 2016-12-12 LAB — TOXASSURE SELECT,+ANTIDEPR,UR

## 2016-12-15 ENCOUNTER — Ambulatory Visit (INDEPENDENT_AMBULATORY_CARE_PROVIDER_SITE_OTHER): Payer: Medicare Other | Admitting: Student in an Organized Health Care Education/Training Program

## 2016-12-15 ENCOUNTER — Encounter: Payer: Self-pay | Admitting: Student in an Organized Health Care Education/Training Program

## 2016-12-15 ENCOUNTER — Other Ambulatory Visit: Payer: Self-pay

## 2016-12-15 VITALS — BP 108/79 | HR 61 | Temp 97.9°F | Ht 61.0 in | Wt 173.9 lb

## 2016-12-15 DIAGNOSIS — F1120 Opioid dependence, uncomplicated: Secondary | ICD-10-CM | POA: Diagnosis present

## 2016-12-15 MED ORDER — BUPRENORPHINE HCL-NALOXONE HCL 8-2 MG SL SUBL: SUBLINGUAL_TABLET | SUBLINGUAL | 0 refills | Status: DC

## 2016-12-15 NOTE — Assessment & Plan Note (Signed)
Moderate opioid use disorder on MAT since 6/20 and doing better. Last urine tox was better, only small metabolite of xanax and she denies any illicit use over the last week. If that is true, she may have achieved remission. Plan is to continue with Suboxone 20mg  daily, I think we can lengthen out to 2 week follow ups at this point. Toxassure collected today to confirm. She likely needs counceling as a supplement, and is looking into resources around [place] to be closer to her.

## 2016-12-15 NOTE — Telephone Encounter (Signed)
buprenorphine-naloxone (SUBOXONE) 8-2 mg SUBL SL tablet, refill request.

## 2016-12-15 NOTE — Patient Instructions (Signed)
1. You are doing well, I gave you a two week supply of suboxone and we will see you in clinic on Tuesday 9/18.  2. Please call the infectious disease clinic at [phone] to set up another appointment for your hepatitis C. This is very important.
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
## 2016-12-15 NOTE — Telephone Encounter (Signed)
Spoke with patient and informed her she must be seen prior to getting suboxone refilled.  Pt was a no show for the [facility] yesterday.  Pt agreed to be seen this afternoon.Despina Hidden Cassady9/5/201810:36 AM

## 2016-12-20 LAB — TOXASSURE SELECT,+ANTIDEPR,UR

## 2016-12-23 ENCOUNTER — Telehealth: Payer: Self-pay | Admitting: *Deleted

## 2016-12-23 ENCOUNTER — Ambulatory Visit (INDEPENDENT_AMBULATORY_CARE_PROVIDER_SITE_OTHER): Payer: Medicare Other | Admitting: Internal Medicine

## 2016-12-23 VITALS — BP 144/94 | HR 64 | Temp 98.3°F | Ht 61.0 in | Wt 169.3 lb

## 2016-12-23 DIAGNOSIS — F1123 Opioid dependence with withdrawal: Secondary | ICD-10-CM

## 2016-12-23 DIAGNOSIS — M791 Myalgia: Secondary | ICD-10-CM

## 2016-12-23 DIAGNOSIS — F17210 Nicotine dependence, cigarettes, uncomplicated: Secondary | ICD-10-CM

## 2016-12-23 DIAGNOSIS — Z608 Other problems related to social environment: Secondary | ICD-10-CM

## 2016-12-23 DIAGNOSIS — F1120 Opioid dependence, uncomplicated: Secondary | ICD-10-CM

## 2016-12-23 DIAGNOSIS — F1193 Opioid use, unspecified with withdrawal: Secondary | ICD-10-CM

## 2016-12-23 DIAGNOSIS — M2550 Pain in unspecified joint: Secondary | ICD-10-CM | POA: Diagnosis not present

## 2016-12-23 MED ORDER — ONDANSETRON 4 MG PO TBDP: 4.0000 mg | ORAL_TABLET | Freq: Once | ORAL | Status: DC

## 2016-12-23 MED ORDER — KETOROLAC TROMETHAMINE 30 MG/ML IJ SOLN
30.0000 mg | Freq: Once | INTRAMUSCULAR | Status: AC
  Administered 2016-12-23: 30 mg via INTRAMUSCULAR

## 2016-12-23 MED ORDER — BUPRENORPHINE HCL-NALOXONE HCL 8-2 MG SL SUBL: SUBLINGUAL_TABLET | SUBLINGUAL | 0 refills | Status: DC

## 2016-12-23 NOTE — Assessment & Plan Note (Addendum)
Patient with relapse and currently in withdrawals due to misplacing her suboxone. Patient states she is going to focus on her health now, will try to maintain only a single residence, and is determined to maintain her OUD treatment.   Patient given one time early refill for suboxone 20mg  daily x 5 days until her next scheduled OUD appointment on 9/18.   Tox Assure not ordered as patient listed her intake of illicit drugs (heroin, cocaine, MDMA) this week; will plan on obtaining one next visit.

## 2016-12-23 NOTE — Progress Notes (Signed)
   12/23/2016  [name] presents for follow up of opioid use disorder I have reviewed the prior induction visit, follow up visits, and telephone encounters relevant to opiate use disorder (OUD) treatment.   Current daily dose: Suboxone 20mg  daily  Date of Induction: 09/29/2016  Current follow up interval, in weeks: 1  The patient has not been adherent with the buprenorphine for OUD contract.   Last UDS Result: +buprenorphine and metabolite; + alprazolam, cocaine, and heroin  HPI:  [name] is a 46yo female with mod opioid use disorder presenting to clinic for management of withdrawals. At last visit patient had been doing well and plan was to f/u in 2 weeks. UDS from that appointment is appropriate for buprenorphine presence, however also has cocaine, alprazolam, and heroin metabolites. Patient states that she is in withdrawals today; she is currently living between 4 different residencies and has misplaced her suboxone. She states that when this has happened in the past, she would fall back on heroin, however her dealer is unavailable right now. Patient reports last use of suboxone 2 days prior, after which she also had heroin. She report using cocaine and Molly yesterday as someone told her it would help her withdrawals, however this has worsened her restlessness. Patient reports diffuse myalgias, arthralgia, nausea, diarrhea, goose bumps, congestion with rhinorrhea and runny eyes, irritability. COWS score during today's visit is 16 signifying moderate withdrawal.   Exam:   Vitals:   12/23/16 1324  BP: (!) 144/94  Pulse: 64  Temp: 98.3 F (36.8 C)  TempSrc: Oral  SpO2: 100%  Weight: 169 lb 4.8 oz (76.8 kg)  Height: 5\' 1"  (1.549 m)   Constitutional: uncomfortable appearing, in mild distress, restless HEENT: mild inc in lacrimation, pupils mildly dilated CV: RRR, no murmurs, rubs or gallops appreciated Resp: CTAB, no increased work of breathing Abd: soft, NDNT Ext: pulses  intact, no LE edema; area of erythema at base of left third digit without drainage.  Assessment/Plan:  See Problem Based Charting in the Encounters Tab   Alphonzo Grieve, MD  12/23/2016  2:00 PM

## 2016-12-23 NOTE — Patient Instructions (Signed)
We gave you a 5 day refill on suboxone 2 and a half tabs per day. Please keep your appointment on Tuesday the 18th.

## 2016-12-23 NOTE — Telephone Encounter (Signed)
Agree with plan 

## 2016-12-23 NOTE — Assessment & Plan Note (Signed)
Patient in moderate opioid withdrawal. She was given IM ketorolac for control of her myalgias and arthralgias until she can pick up her suboxone prescription. Patient declined anti-emetics.

## 2016-12-23 NOTE — Telephone Encounter (Signed)
Pt calls and states she has misplaced her suboxone, states she is living between 2 places and cannot find it, states she is going through withdrawal- restless legs, N&V, jittery, achy. Gave her appt in [facility] at 1300, dr Daryll Drown attending this pm

## 2016-12-28 ENCOUNTER — Ambulatory Visit (INDEPENDENT_AMBULATORY_CARE_PROVIDER_SITE_OTHER): Payer: Medicare Other | Admitting: Internal Medicine

## 2016-12-28 VITALS — BP 105/66 | HR 77 | Temp 98.0°F | Wt 173.4 lb

## 2016-12-28 DIAGNOSIS — Z79891 Long term (current) use of opiate analgesic: Secondary | ICD-10-CM

## 2016-12-28 DIAGNOSIS — F1120 Opioid dependence, uncomplicated: Secondary | ICD-10-CM | POA: Diagnosis present

## 2016-12-28 DIAGNOSIS — F17210 Nicotine dependence, cigarettes, uncomplicated: Secondary | ICD-10-CM | POA: Diagnosis not present

## 2016-12-28 MED ORDER — BUPRENORPHINE HCL-NALOXONE HCL 8-2 MG SL FILM: ORAL_FILM | SUBLINGUAL | 0 refills | Status: DC

## 2016-12-28 NOTE — Progress Notes (Signed)
Internal Medicine Clinic Attending  I saw and evaluated the patient.  I personally confirmed the key portions of the history and exam documented by Dr. Philipp Ovens and I reviewed pertinent patient test results.  The assessment, diagnosis, and plan were formulated together and I agree with the documentation in the resident's note.  [name] is a 46 year old woman here for medication assisted treatment of an opioid use disorder. She has been on treatment with Suboxone since June 20, she tolerated induction very well inside a hospitalized environment. However since being discharged back to home she has had a lot of difficulty completing the stabilization phase. She continues to use heroin and other illicits like cocaine several times a week. She reports last heroin use was 2 days ago. She continues to have a strong motivation to become clean, wants to be in her grandchildren's lives more. I think she has difficulty with impulse control and a very unhealthy social situation where there are constant illicits available. I warned her today that if she continues to use heroin, we will have to escalate to a higher level of care by referring her to a daily treatment center. She doesn't think she will be able to make that commitment because of transportation issues. She assures me that her urine sample next week will be clean, I also am requiring that she visit at least one Narcotics Anonymous meeting within the next week, we gave her a printed list of meetings close to her home.
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
## 2016-12-28 NOTE — Patient Instructions (Addendum)
Ms. Gau,  It was a pleasure to see you today. Please continue to take your buprenorphine as previously prescribed. Please attend 1 narcotics anonymous meeting this week before your follow up appointment. Follow up with [place] in 1 week. Thank you!  - Dr. Philipp Ovens

## 2017-01-03 LAB — TOXASSURE SELECT,+ANTIDEPR,UR

## 2017-01-05 ENCOUNTER — Encounter: Payer: Self-pay | Admitting: Internal Medicine

## 2017-01-05 ENCOUNTER — Ambulatory Visit (INDEPENDENT_AMBULATORY_CARE_PROVIDER_SITE_OTHER): Payer: Medicare Other | Admitting: Internal Medicine

## 2017-01-05 VITALS — BP 140/54 | HR 72 | Wt 181.3 lb

## 2017-01-05 DIAGNOSIS — F1120 Opioid dependence, uncomplicated: Secondary | ICD-10-CM | POA: Diagnosis present

## 2017-01-05 DIAGNOSIS — F419 Anxiety disorder, unspecified: Secondary | ICD-10-CM

## 2017-01-05 DIAGNOSIS — Z9119 Patient's noncompliance with other medical treatment and regimen: Secondary | ICD-10-CM

## 2017-01-05 DIAGNOSIS — F1910 Other psychoactive substance abuse, uncomplicated: Secondary | ICD-10-CM

## 2017-01-05 DIAGNOSIS — F17210 Nicotine dependence, cigarettes, uncomplicated: Secondary | ICD-10-CM | POA: Diagnosis not present

## 2017-01-05 DIAGNOSIS — I079 Rheumatic tricuspid valve disease, unspecified: Secondary | ICD-10-CM | POA: Diagnosis not present

## 2017-01-05 NOTE — Progress Notes (Signed)
Internal Medicine Clinic Attending  I saw and evaluated the patient.  I personally confirmed the key portions of the history and exam documented by Dr. Jari Favre and I reviewed pertinent patient test results.  The assessment, diagnosis, and plan were formulated together and I agree with the documentation in the resident's note.  [name] has moderate to severe oud.  She has, unfortunately, not been completely in treatment or abstinent from substances since starting therapy with buprenorphine.  We discussed today that to comply with our contract, she would need to have only buprenorphine in her urine going forward.  She is in withdrawal today due to losing her Rx.  Will refill today and see next week.

## 2017-01-05 NOTE — Progress Notes (Signed)
   CC: wants suboxone refill  HPI:  [name] is a 46 y.o. F with opioid use disorder, currently in our Suboxone clinic, current polysubstance abuse (hx of cocaine, opiates, marijuana, amphetamines, MDMA, crystal meth), hx of IVDA with hx of tricuspid valve endocarditis and anxiety here requesting refill of her Suboxone after missing her appointment yesterday. She reports she was told by someone here on the phone that as long as she was seen by one of the doctors here that she could get her prescription. She notes money is extremely tight and it will be exceptionally difficult for her to return to clinic for an appointment with either Dr. Daryll Drown or Dr. Evette Doffing.   Unfortunately [name] continues to use illicit drugs. She was instructed to visit at least one Narcotics Anonymous meeting within 1 week of the last time she was seen in clinic on 9/18. She has not done so. She also admits to using a variety of recreational drugs.   Past Medical History:  Diagnosis Date  . Anxiety   . Chronic back pain   . Chronic knee pain   . COPD (chronic obstructive pulmonary disease) ([place])   . DDD (degenerative disc disease)   . 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 Hepatitis C infection 07/13/2014  . IV drug abuse   . Nephrolithiasis   . Panic attacks   . Polysubstance abuse    cocaine, opiates, marijuana, amphetamines, "molly," crystal meth  . Pulmonary embolism ([facility])    Review of Systems:   General: Denies fevers, fatigue Cardiac: Denies CP, SOB Pulmonary: Denies cough, wheezes, PND Extremities: Denies weakness or swelling  Physical Exam: General: Frustrated caucasian woman sitting in chair. Appears older than stated age. HEENT: No icterus, injection or ptosis. No hoarseness or dysarthria  Cardiac: RRR Pulmonary: CTA BL with normal WOB on RA Abd: Soft, non-tended. +bs Extremities: Warm, perfused. No significant pedal edema.   Vitals:   01/05/17 1512  BP: (!) 140/54  Pulse: 72    SpO2: 99%  Weight: 181 lb 4.8 oz (82.2 kg)    Assessment & Plan:   See Encounters Tab for problem based charting.  Patient discussed with Dr. Dareen Piano

## 2017-01-05 NOTE — Addendum Note (Signed)
Addended by: Gilles Chiquito B on: 01/05/2017 01:13 PM   Modules accepted: Level of Service

## 2017-01-05 NOTE — Assessment & Plan Note (Addendum)
This patient has severe opioid use disorder with current/recent relapse of IV heroin. At her visit 9/18, her UDS was inappropriately positive for high levels of morphine, codeine, fentanyl and clonazepam metabolite. It was inappropriately negative for buprenorphine despite her stating it was taken during the preceding few days. She has had similar inappropriate UDS since induction. She has been lectured that [facility] will no longer be able to provide her with its current level of care should she continue to use illicit substances.  She missed her weekly appointment yesterday in the [place] clinic here and is demanding a refill of suboxone today. She states she doesn't feel great but doesn't think she's withdrawing.  -UDS -Patient to make appointment with either of the appropriate licensed prescribers.

## 2017-01-06 ENCOUNTER — Encounter: Payer: Self-pay | Admitting: Student in an Organized Health Care Education/Training Program

## 2017-01-06 ENCOUNTER — Ambulatory Visit (INDEPENDENT_AMBULATORY_CARE_PROVIDER_SITE_OTHER): Payer: Medicare Other | Admitting: Student in an Organized Health Care Education/Training Program

## 2017-01-06 VITALS — BP 129/75 | HR 81 | Temp 98.9°F | Wt 185.4 lb

## 2017-01-06 DIAGNOSIS — F1120 Opioid dependence, uncomplicated: Secondary | ICD-10-CM | POA: Diagnosis not present

## 2017-01-06 DIAGNOSIS — F17210 Nicotine dependence, cigarettes, uncomplicated: Secondary | ICD-10-CM | POA: Diagnosis not present

## 2017-01-06 DIAGNOSIS — Z9114 Patient's other noncompliance with medication regimen: Secondary | ICD-10-CM | POA: Diagnosis not present

## 2017-01-06 DIAGNOSIS — K0889 Other specified disorders of teeth and supporting structures: Secondary | ICD-10-CM

## 2017-01-06 DIAGNOSIS — K047 Periapical abscess without sinus: Secondary | ICD-10-CM

## 2017-01-06 MED ORDER — AMOXICILLIN 500 MG PO TABS: 500.0000 mg | ORAL_TABLET | Freq: Two times a day (BID) | ORAL | 0 refills | Status: DC

## 2017-01-06 MED ORDER — BUPRENORPHINE HCL-NALOXONE HCL 8-2 MG SL FILM: ORAL_FILM | SUBLINGUAL | 0 refills | Status: DC

## 2017-01-06 NOTE — Assessment & Plan Note (Addendum)
Chronic severe opioid use disorder which according to her is now controlled. She still having a lot of cravings, but says it has been over 1 week since she last used illicits. Went to 1 Narcotics Anonymous meeting. Says her urine tox screen will be cleaned today. Reports good compliance with Suboxone. Plan is to continue with Suboxone 20 mg daily. Follow-up on 10/9. U tox collected yesterday.

## 2017-01-06 NOTE — Assessment & Plan Note (Signed)
Dental pain is an acute problem. Patient with multiple caries due to long-standing opioid use. She's got to broken teeth with deep caries in the maxilla, tender to touch. Plan is to try antibiotic for symptom relief given her systemic symptoms of fever. Prescribed amoxicillin 500mg  bid for 5 days. Gave her contact information for the free dental clinic in [place].

## 2017-01-06 NOTE — Progress Notes (Signed)
   01/06/2017  [name] presents for follow up of opioid use disorder I have reviewed the prior induction visit, follow up visits, and telephone encounters relevant to opiate use disorder (OUD) treatment.   Current daily dose: Suboxone 20 mg  Date of Induction: 09/29/2016  Current follow up interval, in weeks: 1  The patient has not been adherent with the buprenorphine for OUD contract.   Last UDS Result: Inappropriate  HPI: 46 year old woman with severe opioid use disorder here for follow-up of medication-assisted treatment. Patient is difficult to control appeared use disorder, she tolerated induction well in the hospital but has been in a prolonged root relapse for the last 8 weeks. We gave her an ultimatum at her last visit a week ago which she has responded to well. She says that she's been clean for over one week. No illicit drug use. Says that it feels really good. Is able to see her grandchildren every day. Went to 1 Narcotics Anonymous meeting, however she didn't like it because she felt like she was being judged because she was an IV drug user. She is that she had a febrile illness following the flu shots last week, says "never again." Also reports having new increasing pain in her upper teeth which she thinks is a abscess. Reports still having some chills, pain is much worse on 2 teeth in her maxilla. Eating and drinking well. No skin abscesses or rashes.   Exam:   Vitals:   01/06/17 1622  BP: 129/75  Pulse: 81  Temp: 98.9 F (37.2 C)  TempSrc: Oral  SpO2: 100%  Weight: 185 lb 6.4 oz (84.1 kg)    Gen: well appearing, no distress ENT: Multiple missing teeth in the upper and lower, multiple caries with broken teeth, no frank. Once draining. There is point tenderness to 2 broken teeth in the maxilla. Psych: appears well put together, normal mood, a little anxious appearing Neuro: normal strength and gait   Assessment/Plan:  See Problem Based Charting in the Encounters  Tab   Axel Filler, MD  01/06/2017  4:40 PM

## 2017-01-11 NOTE — Progress Notes (Signed)
Internal Medicine Clinic Attending  Case discussed with Dr. Molt at the time of the visit.  We reviewed the resident's history and exam and pertinent patient test results.  I agree with the assessment, diagnosis, and plan of care documented in the resident's note. 

## 2017-01-13 LAB — TOXASSURE SELECT,+ANTIDEPR,UR

## 2017-01-18 ENCOUNTER — Encounter: Payer: Self-pay | Admitting: Student in an Organized Health Care Education/Training Program

## 2017-01-20 ENCOUNTER — Telehealth: Payer: Self-pay | Admitting: *Deleted

## 2017-01-20 ENCOUNTER — Telehealth: Payer: Self-pay | Admitting: Student in an Organized Health Care Education/Training Program

## 2017-01-20 MED ORDER — BUPRENORPHINE HCL-NALOXONE HCL 8-2 MG SL FILM: ORAL_FILM | SUBLINGUAL | 0 refills | Status: DC

## 2017-01-20 NOTE — Telephone Encounter (Signed)
Pt is present asking for 1 more script for suboxone until she can find another treatment doctor.  Will we be able to respond?

## 2017-01-20 NOTE — Telephone Encounter (Signed)
Yes. One week supply would be fine to help until she can establish with one of the rehab centers in town. Would you please call it in?

## 2017-01-20 NOTE — Telephone Encounter (Signed)
Spoke w/ pt at length, encouraged her to seek treatment asap. Called script to [facility]

## 2017-05-04 ENCOUNTER — Emergency Department (HOSPITAL_COMMUNITY): Payer: Medicare Other

## 2017-05-04 ENCOUNTER — Observation Stay (HOSPITAL_BASED_OUTPATIENT_CLINIC_OR_DEPARTMENT_OTHER): Payer: Medicare Other

## 2017-05-04 ENCOUNTER — Observation Stay (HOSPITAL_COMMUNITY)
Admission: EM | Admit: 2017-05-04 | Discharge: 2017-05-05 | Disposition: A | Discharge status: Home / Self Care | Payer: Medicare Other | Attending: Internal Medicine | Admitting: Internal Medicine

## 2017-05-04 ENCOUNTER — Encounter (HOSPITAL_COMMUNITY): Payer: Self-pay | Admitting: *Deleted

## 2017-05-04 ENCOUNTER — Other Ambulatory Visit: Payer: Self-pay

## 2017-05-04 DIAGNOSIS — Y33XXXA Other specified events, undetermined intent, initial encounter: Secondary | ICD-10-CM | POA: Insufficient documentation

## 2017-05-04 DIAGNOSIS — M25572 Pain in left ankle and joints of left foot: Secondary | ICD-10-CM | POA: Diagnosis not present

## 2017-05-04 DIAGNOSIS — Z79899 Other long term (current) drug therapy: Secondary | ICD-10-CM | POA: Insufficient documentation

## 2017-05-04 DIAGNOSIS — L03113 Cellulitis of right upper limb: Secondary | ICD-10-CM

## 2017-05-04 DIAGNOSIS — Y939 Activity, unspecified: Secondary | ICD-10-CM | POA: Diagnosis not present

## 2017-05-04 DIAGNOSIS — F1110 Opioid abuse, uncomplicated: Secondary | ICD-10-CM | POA: Diagnosis not present

## 2017-05-04 DIAGNOSIS — L02415 Cutaneous abscess of right lower limb: Secondary | ICD-10-CM | POA: Diagnosis not present

## 2017-05-04 DIAGNOSIS — L0291 Cutaneous abscess, unspecified: Secondary | ICD-10-CM | POA: Diagnosis not present

## 2017-05-04 DIAGNOSIS — Y999 Unspecified external cause status: Secondary | ICD-10-CM | POA: Insufficient documentation

## 2017-05-04 DIAGNOSIS — R509 Fever, unspecified: Secondary | ICD-10-CM

## 2017-05-04 DIAGNOSIS — F17210 Nicotine dependence, cigarettes, uncomplicated: Secondary | ICD-10-CM | POA: Insufficient documentation

## 2017-05-04 DIAGNOSIS — R011 Cardiac murmur, unspecified: Secondary | ICD-10-CM | POA: Insufficient documentation

## 2017-05-04 DIAGNOSIS — Z86711 Personal history of pulmonary embolism: Secondary | ICD-10-CM | POA: Diagnosis not present

## 2017-05-04 DIAGNOSIS — L02416 Cutaneous abscess of left lower limb: Secondary | ICD-10-CM | POA: Diagnosis present

## 2017-05-04 DIAGNOSIS — J449 Chronic obstructive pulmonary disease, unspecified: Secondary | ICD-10-CM | POA: Insufficient documentation

## 2017-05-04 DIAGNOSIS — F1910 Other psychoactive substance abuse, uncomplicated: Secondary | ICD-10-CM | POA: Diagnosis not present

## 2017-05-04 DIAGNOSIS — L03011 Cellulitis of right finger: Secondary | ICD-10-CM

## 2017-05-04 DIAGNOSIS — O99320 Drug use complicating pregnancy, unspecified trimester: Secondary | ICD-10-CM | POA: Diagnosis not present

## 2017-05-04 DIAGNOSIS — S90852A Superficial foreign body, left foot, initial encounter: Secondary | ICD-10-CM | POA: Insufficient documentation

## 2017-05-04 DIAGNOSIS — Y929 Unspecified place or not applicable: Secondary | ICD-10-CM | POA: Diagnosis not present

## 2017-05-04 DIAGNOSIS — L0390 Cellulitis, unspecified: Secondary | ICD-10-CM | POA: Diagnosis present

## 2017-05-04 LAB — CBC WITH DIFFERENTIAL/PLATELET
Basophils Absolute: 0 10*3/uL (ref 0.0–0.1)
Basophils Relative: 1 %
Eosinophils Absolute: 0.1 10*3/uL (ref 0.0–0.7)
Eosinophils Relative: 2 %
HCT: 32.8 % — ABNORMAL LOW (ref 36.0–46.0)
Hemoglobin: 10.3 g/dL — ABNORMAL LOW (ref 12.0–15.0)
Lymphocytes Relative: 42 %
Lymphs Abs: 3.7 10*3/uL (ref 0.7–4.0)
MCH: 24.1 pg — ABNORMAL LOW (ref 26.0–34.0)
MCHC: 31.4 g/dL (ref 30.0–36.0)
MCV: 76.8 fL — ABNORMAL LOW (ref 78.0–100.0)
Monocytes Absolute: 0.6 10*3/uL (ref 0.1–1.0)
Monocytes Relative: 6 %
Neutro Abs: 4.4 10*3/uL (ref 1.7–7.7)
Neutrophils Relative %: 49 %
Platelets: 337 10*3/uL (ref 150–400)
RBC: 4.27 MIL/uL (ref 3.87–5.11)
RDW: 18.4 % — ABNORMAL HIGH (ref 11.5–15.5)
WBC: 8.8 10*3/uL (ref 4.0–10.5)

## 2017-05-04 LAB — COMPREHENSIVE METABOLIC PANEL
ALT: 34 U/L (ref 14–54)
AST: 33 U/L (ref 15–41)
Albumin: 3 g/dL — ABNORMAL LOW (ref 3.5–5.0)
Alkaline Phosphatase: 63 U/L (ref 38–126)
Anion gap: 8 (ref 5–15)
BUN: 14 mg/dL (ref 6–20)
CO2: 27 mmol/L (ref 22–32)
Calcium: 8.5 mg/dL — ABNORMAL LOW (ref 8.9–10.3)
Chloride: 100 mmol/L — ABNORMAL LOW (ref 101–111)
Creatinine, Ser: 0.83 mg/dL (ref 0.44–1.00)
GFR calc Af Amer: >60 mL/min (ref >60)
GFR calc non Af Amer: >60 mL/min (ref >60)
Glucose, Bld: 106 mg/dL — ABNORMAL HIGH (ref 65–99)
Potassium: 3.6 mmol/L (ref 3.5–5.1)
Sodium: 135 mmol/L (ref 135–145)
Total Bilirubin: 0.3 mg/dL (ref 0.3–1.2)
Total Protein: 8.1 g/dL (ref 6.5–8.1)

## 2017-05-04 LAB — URINALYSIS, ROUTINE W REFLEX MICROSCOPIC
Bacteria, UA: NONE SEEN
Bilirubin Urine: NEGATIVE
Glucose, UA: NEGATIVE mg/dL
Hgb urine dipstick: NEGATIVE
Ketones, ur: NEGATIVE mg/dL
Nitrite: NEGATIVE
Protein, ur: NEGATIVE mg/dL
Specific Gravity, Urine: 1.002 — ABNORMAL LOW (ref 1.005–1.030)
pH: 7 (ref 5.0–8.0)

## 2017-05-04 LAB — RAPID URINE DRUG SCREEN, HOSP PERFORMED
Amphetamines: NOT DETECTED
Barbiturates: NOT DETECTED
Benzodiazepines: NOT DETECTED
Cocaine: NOT DETECTED
Opiates: POSITIVE — AB
Tetrahydrocannabinol: NOT DETECTED

## 2017-05-04 LAB — C-REACTIVE PROTEIN: CRP: 3.5 mg/dL — ABNORMAL HIGH (ref <1.0)

## 2017-05-04 LAB — ECHOCARDIOGRAM COMPLETE
Height: 61 in
Weight: 2611.2 oz

## 2017-05-04 LAB — SEDIMENTATION RATE: Sed Rate: 40 mm/hr — ABNORMAL HIGH (ref 0–22)

## 2017-05-04 MED ORDER — ACETAMINOPHEN 325 MG PO TABS: 650.0000 mg | ORAL_TABLET | Freq: Four times a day (QID) | ORAL | Status: DC | PRN

## 2017-05-04 MED ORDER — PIPERACILLIN-TAZOBACTAM 3.375 G IVPB: 3.3750 g | Freq: Once | INTRAVENOUS | Status: DC

## 2017-05-04 MED ORDER — ACETAMINOPHEN 650 MG RE SUPP: 650.0000 mg | Freq: Four times a day (QID) | RECTAL | Status: DC | PRN

## 2017-05-04 MED ORDER — ENOXAPARIN SODIUM 40 MG/0.4ML ~~LOC~~ SOLN
40.0000 mg | SUBCUTANEOUS | Status: DC
  Filled 2017-05-04: qty 0.4

## 2017-05-04 MED ORDER — VANCOMYCIN HCL IN DEXTROSE 1-5 GM/200ML-% IV SOLN
1000.0000 mg | Freq: Once | INTRAVENOUS | Status: AC
  Administered 2017-05-04: 1000 mg via INTRAVENOUS
  Filled 2017-05-04: qty 200

## 2017-05-04 MED ORDER — BUPRENORPHINE HCL-NALOXONE HCL 2-0.5 MG SL SUBL
1.0000 | SUBLINGUAL_TABLET | Freq: Every day | SUBLINGUAL | Status: DC
  Administered 2017-05-04 – 2017-05-05 (×2): 1 via SUBLINGUAL
  Filled 2017-05-04 (×2): qty 1

## 2017-05-04 MED ORDER — SODIUM CHLORIDE 0.9 % IV SOLN
INTRAVENOUS | Status: AC
  Administered 2017-05-04: 09:00:00 via INTRAVENOUS

## 2017-05-04 MED ORDER — IBUPROFEN 400 MG PO TABS
400.0000 mg | ORAL_TABLET | Freq: Four times a day (QID) | ORAL | Status: DC | PRN
  Administered 2017-05-04 – 2017-05-05 (×2): 400 mg via ORAL
  Filled 2017-05-04 (×2): qty 1

## 2017-05-04 MED ORDER — VANCOMYCIN HCL IN DEXTROSE 750-5 MG/150ML-% IV SOLN
750.0000 mg | Freq: Two times a day (BID) | INTRAVENOUS | Status: DC
  Administered 2017-05-04 – 2017-05-05 (×2): 750 mg via INTRAVENOUS
  Filled 2017-05-04 (×4): qty 150

## 2017-05-04 NOTE — Progress Notes (Signed)
[facility]  Received consult this PM. Will see in AM. US of the upper extremities ordered to assess abscess / foreign body. Has foreign body in the ankle on the right likely from the drug use.   Continue IV antibiotics. Will follow up ultrasounds.   Curlene Labrum, MD [facility] [address] [place], [place] [address] [phone] (office)

## 2017-05-04 NOTE — Progress Notes (Addendum)
PROGRESS NOTE    [name]  JQZ:[id number] DOB: [birth year] DOA: 05/04/2017 PCP: Patient, [name]     Brief Narrative:  47 y/o woman admitted from home on 1/22 due to subjective fevers and chills.  She also has redness over the dorsum of her right wrist as well as over the internal aspect of her left forearm and right ankle.  She has a past medical history significant for IV drug abuse with heroin.  She has started to experience some withdrawals on 1/23.   Assessment & Plan:   Principal Problem:   Cellulitis   Cellulitis -At site of IV drug injections.  There also appear to be raised areas that may represent small abscesses although I believe they are more than likely local inflammatory response from IV drug injections. Continue IV vancomycin, I believe it is okay to discontinue zosyn at this time. -Echo is pending, blood cultures remain negative at 12 hours. -If cultures are negative at 24 hours, will narrow antibiotic therapy and likely transition to oral regimen for discharge home.  IV drug abuse -Counseled on cessation. -She has started to experience withdrawals, will place on Suboxone while in the hospital.  For now once daily but can up titrate to twice daily if needed.   DVT prophylaxis: Lovenox Code Status: Full code Family Communication: Patient only Disposition Plan: Anticipate discharge home in 24-48 hours pending blood culture data  Consultants:   None  Procedures:   2D echo, pending  Antimicrobials:  Anti-infectives (From admission, onward)   Start     Dose/Rate Route Frequency Ordered Stop   05/04/17 1800  vancomycin (VANCOCIN) IVPB 750 mg/150 ml premix     750 mg 150 mL/hr over 60 Minutes Intravenous Every 12 hours 05/04/17 0949     05/04/17 0645  vancomycin (VANCOCIN) IVPB 1000 mg/200 mL premix     1,000 mg 200 mL/hr over 60 Minutes Intravenous  Once 05/04/17 0632 05/04/17 0801   05/04/17 0645  piperacillin-tazobactam (ZOSYN) IVPB 3.375 g     3.375 g 12.5 mL/hr over 240 Minutes Intravenous  Once 05/04/17 6226         Subjective: Felt tremorous, nausea, vomiting, improved after Suboxone administered.  Objective: Vitals:   05/04/17 0311 05/04/17 0646 05/04/17 0745 05/04/17 1450  BP:  (!) 88/66 (!) 106/59 129/84  Pulse:  62 (!) 59 75  Resp:  16 16 18   Temp: 98.7 F (37.1 C)  98.5 F (36.9 C) 98.9 F (37.2 C)  TempSrc: Oral  Tympanic Oral  SpO2:  95% 99% 100%  Weight:   74 kg (163 lb 3.2 oz)   Height:   5\' 1"  (1.549 m)     Intake/Output Summary (Last 24 hours) at 05/04/2017 1815 Last data filed at 05/04/2017 1500 Gross per 24 hour  Intake 947.5 ml  Output -  Net 947.5 ml   Filed Weights   05/04/17 0110 05/04/17 0745  Weight: 63.5 kg (140 lb) 74 kg (163 lb 3.2 oz)    Examination:  General exam: Alert, awake, oriented x 3 Respiratory system: Clear to auscultation. Respiratory effort normal. Cardiovascular system:RRR. No murmurs, rubs, gallops. Gastrointestinal system: Abdomen is nondistended, soft and nontender. No organomegaly or masses felt. Normal bowel sounds heard. Central nervous system: Alert and oriented. No focal neurological deficits. Extremities: No C/C/E, +pedal pulses Skin: Multiple small possible abscesses surrounded by cellulitis at left forearm, right wrist, right ankle all at sites of previous narcotic injection. Psychiatry: Judgement and insight appear normal. Mood &  affect appropriate.     Data Reviewed: I have personally reviewed following labs and imaging studies  CBC: Recent Labs  Lab 05/04/17 0307  WBC 8.8  NEUTROABS 4.4  HGB 10.3*  HCT 32.8*  MCV 76.8*  PLT 409   Basic Metabolic Panel: Recent Labs  Lab 05/04/17 0307  NA 135  K 3.6  CL 100*  CO2 27  GLUCOSE 106*  BUN 14  CREATININE 0.83  CALCIUM 8.5*   GFR: Estimated Creatinine Clearance: 78.8 mL/min (by C-G formula based on SCr of 0.83 mg/dL). Liver Function Tests: Recent Labs  Lab 05/04/17 0307  AST 33    ALT 34  ALKPHOS 63  BILITOT 0.3  PROT 8.1  ALBUMIN 3.0*   No results for input(s): LIPASE, AMYLASE in the last 168 hours. No results for input(s): AMMONIA in the last 168 hours. Coagulation Profile: No results for input(s): INR, PROTIME in the last 168 hours. Cardiac Enzymes: No results for input(s): CKTOTAL, CKMB, CKMBINDEX, TROPONINI in the last 168 hours. BNP (last 3 results) No results for input(s): PROBNP in the last 8760 hours. HbA1C: No results for input(s): HGBA1C in the last 72 hours. CBG: No results for input(s): GLUCAP in the last 168 hours. Lipid Profile: No results for input(s): CHOL, HDL, LDLCALC, TRIG, CHOLHDL, LDLDIRECT in the last 72 hours. Thyroid Function Tests: No results for input(s): TSH, T4TOTAL, FREET4, T3FREE, THYROIDAB in the last 72 hours. Anemia Panel: No results for input(s): VITAMINB12, FOLATE, FERRITIN, TIBC, IRON, RETICCTPCT in the last 72 hours. Urine analysis:    Component Value Date/Time   COLORURINE COLORLESS (A) 05/04/2017 0143   APPEARANCEUR CLEAR 05/04/2017 0143   LABSPEC 1.002 (L) 05/04/2017 0143   PHURINE 7.0 05/04/2017 0143   GLUCOSEU NEGATIVE 05/04/2017 0143   HGBUR NEGATIVE 05/04/2017 0143   BILIRUBINUR NEGATIVE 05/04/2017 0143   KETONESUR NEGATIVE 05/04/2017 0143   PROTEINUR NEGATIVE 05/04/2017 0143   UROBILINOGEN 0.2 10/18/2014 0100   NITRITE NEGATIVE 05/04/2017 0143   LEUKOCYTESUR TRACE (A) 05/04/2017 0143   Sepsis Labs: @LABRCNTIP (procalcitonin:4,lacticidven:4)  ) Recent Results (from the past 240 hour(s))  Culture, blood (routine x 2)     Status: None (Preliminary result)   Collection Time: 05/04/17  5:57 AM  Result Value Ref Range Status   Specimen Description BLOOD LEFT ARM DRAWN BY RN  Final   Special Requests   Final    BOTTLES DRAWN AEROBIC ONLY Blood Culture adequate volume   Culture NO GROWTH <12 HOURS  Final   Report Status PENDING  Incomplete  Culture, blood (routine x 2)     Status: None (Preliminary  result)   Collection Time: 05/04/17  6:23 AM  Result Value Ref Range Status   Specimen Description BLOOD RIGHT ARM  Final   Special Requests   Final    BOTTLES DRAWN AEROBIC AND ANAEROBIC Blood Culture adequate volume   Culture NO GROWTH <12 HOURS  Final   Report Status PENDING  Incomplete         Radiology Studies: Dg Ankle Complete Left  Result Date: 05/04/2017 CLINICAL DATA:  Abscess over the medial malleolus EXAM: LEFT ANKLE COMPLETE - 3+ VIEW COMPARISON:  01/28/2008 FINDINGS: No fracture or malalignment. Ankle mortise is symmetric. No periostitis. Small plantar calcaneal spur. 8 mm possible foreign body within the anterior heel pad. Soft tissue ulcer over the medial malleolar region. No soft tissue gas IMPRESSION: 1. No acute osseous abnormality 2. 8 mm linear foreign body within the anterior heel. Electronically Signed   By:  Donavan Foil M.D.   On: 05/04/2017 03:28        Scheduled Meds: . buprenorphine-naloxone  1 tablet Sublingual Daily  . enoxaparin (LOVENOX) injection  40 mg Subcutaneous Q24H   Continuous Infusions: . sodium chloride 75 mL/hr at 05/04/17 0846  . piperacillin-tazobactam (ZOSYN)  IV    . vancomycin 750 mg (05/04/17 1809)     LOS: 0 days    Time spent: 25 minutes. Greater than 50% of this time was spent in direct contact with the patient coordinating care.     Lelon Frohlich, MD [facility] Pager [phone]  If 7PM-7AM, please contact night-coverage [email or web address] Password [facility] 05/04/2017, 6:15 PM

## 2017-05-04 NOTE — Progress Notes (Signed)
ANTIBIOTIC CONSULT NOTE-Preliminary  Pharmacy Consult for Vancomycin and Zosyn Indication: Cellulitis  Allergies  Allergen Reactions  . Mango Flavor Hives and Swelling  . Pyridium [Phenazopyridine Hcl] Other (See Comments)    Irritation to skin  . Tramadol Nausea And Vomiting    Patient Measurements: Height: 5\' 1"  (154.9 cm) Weight: 140 lb (63.5 kg) IBW/kg (Calculated) : 47.8  Vital Signs: Temp: 98.7 F (37.1 C) (01/23 0311) Temp Source: Oral (01/23 0311) BP: 133/102 (01/23 0110) Pulse Rate: 78 (01/23 0110)  Labs: Recent Labs    05/04/17 0307  WBC 8.8  HGB 10.3*  PLT 337  CREATININE 0.83    Estimated Creatinine Clearance: 73.1 mL/min (by C-G formula based on SCr of 0.83 mg/dL).  No results for input(s): VANCOTROUGH, VANCOPEAK, VANCORANDOM, GENTTROUGH, GENTPEAK, GENTRANDOM, TOBRATROUGH, TOBRAPEAK, TOBRARND, AMIKACINPEAK, AMIKACINTROU, AMIKACIN in the last 72 hours.   Microbiology: No results found for this or any previous visit (from the past 720 hour(s)).  Medical History: Past Medical History:  Diagnosis Date  . Anxiety   . Chronic back pain   . Chronic knee pain   . COPD (chronic obstructive pulmonary disease) ([place])   . DDD (degenerative disc disease)   . 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 Hepatitis C infection 07/13/2014  . IV drug abuse ([place])   . Nephrolithiasis   . Panic attacks   . Polysubstance abuse ([facility])    cocaine, opiates, marijuana, amphetamines, "molly," crystal meth  . Pulmonary embolism ([place])   . Septic arthritis of right sternoclavicular joint ([place]) 02/21/2016    Medications:   Assessment: 47 yo female with hx of IVDA seen in the ED for complaints of subjective fever x  Several days. She has redness of the right wrist, a skin nodule on the right forearm and skin ulcer on the left ankle. Pharmacy has been consulted for Vancomycin and Zosyn dosing for cellulitis.  Goal of Therapy:  Vancomycin troughs 10-15 mcg/ml Eradicate  infection  Plan:  Preliminary review of pertinent patient information completed.  Protocol will be initiated with doses of Vancomycin 1000 mg IV and Zosyn 3.375 Gm IV.  Forestine Na clinical pharmacist will complete review during morning rounds to assess patient and finalize treatment regimen if needed.  Norberto Sorenson, [facility] 05/04/2017,6:33 AM

## 2017-05-04 NOTE — ED Notes (Signed)
Pt to Xray at this time

## 2017-05-04 NOTE — ED Provider Notes (Signed)
[facility] EMERGENCY DEPARTMENT Provider Note   CSN: [id number] Arrival date & time: 05/04/17  0047  Time seen 2:35 AM   History   Chief Complaint Chief Complaint  Patient presents with  . Abscess    HPI [name] is a 47 y.o. female.  HPI patient is active IV drug abuser, heroin.  She states she started out with a clean needle however she keeps using it and she now has multiple abscesses.  She states she has one on her left ankle but started about a week ago that has been draining.  She also has several lesions on her arms.  She states she had undocumented fever yesterday and she has been having chills the last 2-3 days.  She denies cough, chest pain, or shortness of breath.  Patient was going to the Suboxone clinic however she quit in October.  There was some issues with her boyfriend at that time who was a user.  She states the last time she went to detox was 5-6 years ago and she stayed clean for 8 months.  PCP none  Past Medical History:  Diagnosis Date  . Anxiety   . Chronic back pain   . Chronic knee pain   . COPD (chronic obstructive pulmonary disease) ([place])   . DDD (degenerative disc disease)   . 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 Hepatitis C infection 07/13/2014  . IV drug abuse ([place])   . Nephrolithiasis   . Panic attacks   . Polysubstance abuse (HCC)    cocaine, opiates, marijuana, amphetamines, "molly," crystal meth  . Pulmonary embolism ([place])   . Septic arthritis of right sternoclavicular joint ([place]) 02/21/2016    Patient Active Problem List   Diagnosis Date Noted  . Cellulitis 05/04/2017  . Dental infection 01/06/2017  . Hepatitis C infection 07/13/2014  . Opioid use disorder, severe, dependence ([place]) 06/17/2014    Past Surgical History:  Procedure Laterality Date  . APPLICATION OF A-CELL OF CHEST/ABDOMEN Right 09/07/2016   Procedure: APPLICATION OF A-CELL OF CHEST/ABDOMEN;  Surgeon: Wallace Going, DO;  Location: [place];  Service:  Plastics;  Laterality: Right;  . CESAREAN SECTION    . I&D EXTREMITY Right 09/02/2016   Procedure: IRRIGATION AND DEBRIDEMENT OF RIGHT STERNOCLAVICULAR JOINT, STERNUM AND RIBS;  Surgeon: Gaye Pollack, MD;  Location: [facility] [facility];  Service: Vascular;  Laterality: Right;  . INCISION AND DRAINAGE OF WOUND Right 09/07/2016   Procedure: IRRIGATION AND DEBRIDEMENT OF CHEST WOUND, PLACEMENT OF A CELL AND VAC;  Surgeon: Wallace Going, DO;  Location: [place];  Service: Plastics;  Laterality: Right;  . NOSE SURGERY    . STERNAL WOUND DEBRIDEMENT N/A 02/22/2016   Procedure: IRRIGATION AND DEBRIDEMENT MANUBRIUM ABSCESS with wound cultures;  Surgeon: Melrose Nakayama, MD;  Location: [place];  Service: Thoracic;  Laterality: N/A;  . STERNAL WOUND DEBRIDEMENT N/A 02/23/2016   Procedure: DRESSING CHANGE UNDER ANESTHESIA  WITH WOUND VAC PLACEMENT;  Surgeon: Melrose Nakayama, MD;  Location: [place];  Service: Thoracic;  Laterality: N/A;  . TEE WITHOUT CARDIOVERSION N/A 02/23/2016   Procedure: TRANSESOPHAGEAL ECHOCARDIOGRAM (TEE);  Surgeon: Jerline Pain, MD;  Location: [facility] ENDOSCOPY;  Service: Cardiovascular;  Laterality: N/A;  . TUBAL LIGATION     "tied, burned, clipped"    OB History    Gravida Para Term Preterm AB Living   2 2 2     2    SAB TAB Ectopic Multiple Live Births  Home Medications    Prior to Admission medications   Medication Sig Start Date End Date Taking? Authorizing Provider  amoxicillin (AMOXIL) 500 MG tablet Take 1 tablet (500 mg total) by mouth 2 (two) times daily. 01/06/17   Axel Filler, MD  Buprenorphine HCl-Naloxone HCl 8-2 MG FILM Take 1 film twice daily and 1/2 film at night 01/20/17   Axel Filler, MD  furosemide (LASIX) 40 MG tablet Take 1 tablet (40 mg total) by mouth daily as needed for fluid. 11/30/16   Sid Falcon, MD    Family History Family History  Problem Relation Age of Onset  . COPD Mother   . Hypertension Father   .  Heart attack Father   . Diabetes Father   . Arthritis Unknown   . Lung disease Unknown   . Cancer Unknown   . Asthma Unknown     Social History Social History   Tobacco Use  . Smoking status: Current Every Day Smoker    Packs/day: 0.50    Years: 32.00    Pack years: 16.00    Types: Cigarettes  . Smokeless tobacco: Never Used  Substance Use Topics  . Alcohol use: No    Alcohol/week: 0.0 oz  . Drug use: Yes    Types: IV, Cocaine, Marijuana, Methamphetamines, Heroin, Other-see comments    Comment: h/o Crystal meth, "molly," narcotics      Allergies   Mango flavor; Pyridium [phenazopyridine hcl]; and Tramadol   Review of Systems Review of Systems  All other systems reviewed and are negative.    Physical Exam Updated Vital Signs BP (!) 133/102 (BP Location: Right Arm)   Pulse 78   Temp 98.7 F (37.1 C) (Oral)   Resp 18   Ht 5\' 1"  (1.549 m)   Wt 63.5 kg (140 lb)   SpO2 99%   BMI 26.45 kg/m   Vital signs normal    Physical Exam  Constitutional: She is oriented to person, place, and time. She appears well-developed and well-nourished.  Non-toxic appearance. She does not appear ill. No distress.  HENT:  Head: Normocephalic and atraumatic.  Right Ear: External ear normal.  Left Ear: External ear normal.  Nose: Nose normal. No mucosal edema or rhinorrhea.  Mouth/Throat: Oropharynx is clear and moist and mucous membranes are normal. No dental abscesses or uvula swelling.  Eyes: Conjunctivae and EOM are normal. Pupils are equal, round, and reactive to light.  Neck: Normal range of motion and full passive range of motion without pain. Neck supple.  Cardiovascular: Normal rate and regular rhythm. Exam reveals no gallop and no friction rub.  Murmur heard. Patient has a faint systolic murmur heard best in the right lower sternal border  Pulmonary/Chest: Effort normal and breath sounds normal. No respiratory distress. She has no wheezes. She has no rhonchi. She has no  rales. She exhibits no tenderness and no crepitus.  Abdominal: Soft. Normal appearance and bowel sounds are normal. She exhibits no distension. There is no tenderness. There is no rebound and no guarding.  Musculoskeletal: Normal range of motion. She exhibits no edema or tenderness.  Moves all extremities well.   Neurological: She is alert and oriented to person, place, and time. She has normal strength. No cranial nerve deficit.  Skin: Skin is warm, dry and intact. No rash noted. No erythema. No pallor.  Patient has multiple areas of abscesses on her forearms, wrists, and her ankle.  Please see pictures.  Psychiatric: She has a normal mood  and affect. Her speech is normal and behavior is normal. Her mood appears not anxious.  Nursing note and vitals reviewed.              ED Treatments / Results  Labs (all labs ordered are listed, but only abnormal results are displayed) Results for orders placed or performed during the hospital encounter of 05/04/17  Comprehensive metabolic panel  Result Value Ref Range   Sodium 135 135 - 145 mmol/L   Potassium 3.6 3.5 - 5.1 mmol/L   Chloride 100 (L) 101 - 111 mmol/L   CO2 27 22 - 32 mmol/L   Glucose, Bld 106 (H) 65 - 99 mg/dL   BUN 14 6 - 20 mg/dL   Creatinine, Ser 0.83 0.44 - 1.00 mg/dL   Calcium 8.5 (L) 8.9 - 10.3 mg/dL   Total Protein 8.1 6.5 - 8.1 g/dL   Albumin 3.0 (L) 3.5 - 5.0 g/dL   AST 33 15 - 41 U/L   ALT 34 14 - 54 U/L   Alkaline Phosphatase 63 38 - 126 U/L   Total Bilirubin 0.3 0.3 - 1.2 mg/dL   GFR calc non Af Amer >60 >60 mL/min   GFR calc Af Amer >60 >60 mL/min   Anion gap 8 5 - 15  CBC with Differential  Result Value Ref Range   WBC 8.8 4.0 - 10.5 K/uL   RBC 4.27 3.87 - 5.11 MIL/uL   Hemoglobin 10.3 (L) 12.0 - 15.0 g/dL   HCT 32.8 (L) 36.0 - 46.0 %   MCV 76.8 (L) 78.0 - 100.0 fL   MCH 24.1 (L) 26.0 - 34.0 pg   MCHC 31.4 30.0 - 36.0 g/dL   RDW 18.4 (H) 11.5 - 15.5 %   Platelets 337 150 - 400 K/uL   Neutrophils  Relative % 49 %   Neutro Abs 4.4 1.7 - 7.7 K/uL   Lymphocytes Relative 42 %   Lymphs Abs 3.7 0.7 - 4.0 K/uL   Monocytes Relative 6 %   Monocytes Absolute 0.6 0.1 - 1.0 K/uL   Eosinophils Relative 2 %   Eosinophils Absolute 0.1 0.0 - 0.7 K/uL   Basophils Relative 1 %   Basophils Absolute 0.0 0.0 - 0.1 K/uL  Sedimentation rate  Result Value Ref Range   Sed Rate 40 (H) 0 - 22 mm/hr   Laboratory interpretation all normal except anemia, some elevation of the sed rate    EKG  EKG Interpretation None       Radiology Dg Ankle Complete Left  Result Date: 05/04/2017 CLINICAL DATA:  Abscess over the medial malleolus EXAM: LEFT ANKLE COMPLETE - 3+ VIEW COMPARISON:  01/28/2008 FINDINGS: No fracture or malalignment. Ankle mortise is symmetric. No periostitis. Small plantar calcaneal spur. 8 mm possible foreign body within the anterior heel pad. Soft tissue ulcer over the medial malleolar region. No soft tissue gas IMPRESSION: 1. No acute osseous abnormality 2. 8 mm linear foreign body within the anterior heel. Electronically Signed   By: Donavan Foil M.D.   On: 05/04/2017 03:28    Procedures Procedures (including critical care time)   TEE 09/02/2016 Result status: Final result   Aortic valve: No AV so it would seem that  Mitral valve: No leaflet thickening and calcification present.  Right ventricle: Normal cavity size, wall thickness and ejection fraction.  Septum: No Patent Foramen Ovale present.  Left atrium: Patent foramen ovale not present.  Pulmonic valve: Trace regurgitation.  Left ventricle: The LV diameter was at the  upper limits of normal and measured 5.3 cm at end-diastole at the mid-papillary level in the transgastric short-axis view. There was normal LV systolic function, the ejection fraction was estimated at 55-60% using the Simpson's method in the 4 and 2 chamber views. There were no regional wall motion abnormalities.  Left atrium: No spontaneous echo  contrast.       Medications Ordered in ED Medications  vancomycin (VANCOCIN) IVPB 1000 mg/200 mL premix (not administered)  piperacillin-tazobactam (ZOSYN) IVPB 3.375 g (not administered)     Initial Impression / Assessment and Plan / ED Course  I have reviewed the triage vital signs and the nursing notes.  Pertinent labs & imaging results that were available during my care of the patient were reviewed by me and considered in my medical decision making (see chart for details).     My concern was I now hear heart murmur, when I review her chart her last physical exam where they document listening to her heart which was in June there is no murmur mention.  She did have a history however at that time of tricuspid valve vegetation, although they state there was no tricuspid valve deterioration, that was in November 2017, so it would seem possible that she would have a murmur.  4:15 AM I showed the patient her x-ray which shows a broken needle in her sole of her left foot.  She is unaware of it.  She denies any pain with walking.  5:39 AM patient discussed with Dr. Maudie Mercury, hospitalist.  Her sed rate is moderately elevated, when she had her endocarditis it was in the 70s, now it is 66.  He is going to talk to the patient and determine if she needs to be admitted.  Final Clinical Impressions(s) / ED Diagnoses   Final diagnoses:  IVDA (intravenous drug abuse) complicating pregnancy ([place])  Heroin abuse ([place])  Abscess of multiple sites    Plan admission  [name], MD, Barbette Or, MD 05/04/17 [phone]

## 2017-05-04 NOTE — ED Triage Notes (Signed)
Pt c/o abscess to left inner ankle that is open and has some green drainage; pt has an abscess to each arm that came up yesterday

## 2017-05-04 NOTE — Progress Notes (Signed)
Pharmacy Antibiotic Note  [name] is a 47 y.o. female admitted on 05/04/2017 with cellulitis.  Pharmacy has been consulted for Vancomycin dosing.  Plan: Vancomycin 1000mg  loading dose, then 750 IV every 12 hours.  Goal trough 10-15 mcg/mL.  F/U cxs and clinical progress Monitor V/S, labs, and levels as indicated  Height: 5\' 1"  (154.9 cm) Weight: 163 lb 3.2 oz (74 kg) IBW/kg (Calculated) : 47.8  Temp (24hrs), Avg:98.6 F (37 C), Min:98.5 F (36.9 C), Max:98.7 F (37.1 C)  Recent Labs  Lab 05/04/17 0307  WBC 8.8  CREATININE 0.83    Estimated Creatinine Clearance: 78.8 mL/min (by C-G formula based on SCr of 0.83 mg/dL).    Allergies  Allergen Reactions  . Mango Flavor Hives and Swelling  . Pyridium [Phenazopyridine Hcl] Other (See Comments)    Irritation to skin  . Tramadol Nausea And Vomiting    Antimicrobials this admission: Vancomycin 1/23 >>   Dose adjustments this admission: N/A  Microbiology results: 1/23 BCx: pending  Thank you for allowing pharmacy to be a part of this patient's care.  Isac Sarna, BS Pharm D, [place] Clinical Pharmacist Pager [phone] 05/04/2017 10:49 AM

## 2017-05-04 NOTE — ED Notes (Signed)
Lab notified of add on C-reactive protein

## 2017-05-04 NOTE — Progress Notes (Signed)
*  PRELIMINARY RESULTS* Echocardiogram 2D Echocardiogram has been performed.  [name] 05/04/2017, 5:02 PM

## 2017-05-04 NOTE — H&P (Signed)
TRH H&P   Patient Demographics:    [name], is a 47 y.o. female  MRN: [id number]   DOB - 03/17/1971  Admit Date - 05/04/2017  Outpatient Primary MD for the patient is Patient, [name]  Referring MD/NP/PA:  [name]  Outpatient Specialists:   Patient coming from: home  Chief Complaint  Patient presents with  . Abscess      HPI:    [name]  is a 47 y.o. female, w hx of IVDA apparently has c/o subjective fever for the past week. She is unable to tell how long the redness over the dorsum of right wrist as well as skin nodule on the right forearm has been there.or the skin ulcer on the left ankle.  Pt presented to ED for c/o skin ulcer.    In ED,  Dr. Tomi Bamberger thought that she heard a new murmer ?  Xray left ankle IMPRESSION: 1. No acute osseous abnormality 2. 8 mm linear foreign body within the anterior heel.  Na 135, K 3.6, Bun  14, Creatinine 0.83 Ast 33, Alt 34 Wbc 8.8, Hgb 10,3 Plt 337    Review of systems:    In addition to the HPI above,  No Fever-chills, No Headache, No changes with Vision or hearing, No problems swallowing food or Liquids, No Chest pain, Cough or Shortness of Breath, No Abdominal pain, No Nausea or Vommitting, Bowel movements are regular, No Blood in stool or Urine, No dysuria,  No new joints pains-aches,  No new weakness, tingling, numbness in any extremity, No recent weight gain or loss, No polyuria, polydypsia or polyphagia, No significant Mental Stressors.  A full 10 point Review of Systems was done, except as stated above, all other Review of Systems were negative.   With Past History of the following :    Past Medical History:  Diagnosis Date  . Anxiety   . Chronic back pain   . Chronic knee pain   . COPD (chronic obstructive pulmonary disease) ([place])   . DDD (degenerative disc disease)   . 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 Hepatitis C infection 07/13/2014  . IV drug abuse ([place])   . Nephrolithiasis   . Panic attacks   . Polysubstance abuse ([facility])    cocaine, opiates, marijuana, amphetamines, "molly," crystal meth  . Pulmonary embolism ([place])   . Septic arthritis of right sternoclavicular joint ([place]) 02/21/2016      Past Surgical History:  Procedure Laterality Date  . APPLICATION OF A-CELL OF CHEST/ABDOMEN Right 09/07/2016   Procedure: APPLICATION OF A-CELL OF CHEST/ABDOMEN;  Surgeon: Wallace Going, DO;  Location: [place];  Service: Plastics;  Laterality: Right;  . CESAREAN SECTION    . I&D EXTREMITY Right 09/02/2016   Procedure: IRRIGATION AND DEBRIDEMENT OF RIGHT STERNOCLAVICULAR JOINT, STERNUM AND RIBS;  Surgeon: Gaye Pollack, MD;  Location: [place];  Service: Vascular;  Laterality: Right;  . INCISION AND DRAINAGE OF WOUND Right 09/07/2016   Procedure: IRRIGATION AND DEBRIDEMENT OF CHEST WOUND, PLACEMENT OF A CELL AND VAC;  Surgeon: Wallace Going, DO;  Location: [place];  Service: Plastics;  Laterality: Right;  . NOSE SURGERY    . STERNAL WOUND DEBRIDEMENT N/A 02/22/2016   Procedure: IRRIGATION AND DEBRIDEMENT MANUBRIUM ABSCESS with wound cultures;  Surgeon: Melrose Nakayama, MD;  Location: [place];  Service: Thoracic;  Laterality: N/A;  . STERNAL WOUND DEBRIDEMENT N/A 02/23/2016   Procedure: DRESSING CHANGE UNDER ANESTHESIA  WITH WOUND VAC PLACEMENT;  Surgeon: Melrose Nakayama, MD;  Location: [place];  Service: Thoracic;  Laterality: N/A;  . TEE WITHOUT CARDIOVERSION N/A 02/23/2016   Procedure: TRANSESOPHAGEAL ECHOCARDIOGRAM (TEE);  Surgeon: Jerline Pain, MD;  Location: [facility] ENDOSCOPY;  Service: Cardiovascular;  Laterality: N/A;  . TUBAL LIGATION     "tied, burned, clipped"      Social History:     Social History   Tobacco Use  . Smoking status: Current Every Day Smoker    Packs/day: 0.50    Years: 32.00    Pack years: 16.00    Types: Cigarettes  .  Smokeless tobacco: Never Used  Substance Use Topics  . Alcohol use: No    Alcohol/week: 0.0 oz     Lives - at home  Mobility - walks by self   Family History :     Family History  Problem Relation Age of Onset  . COPD Mother   . Hypertension Father   . Heart attack Father   . Diabetes Father   . Arthritis Unknown   . Lung disease Unknown   . Cancer Unknown   . Asthma Unknown       Home Medications:   Prior to Admission medications   Medication Sig Start Date End Date Taking? Authorizing Provider  amoxicillin (AMOXIL) 500 MG tablet Take 1 tablet (500 mg total) by mouth 2 (two) times daily. 01/06/17   Axel Filler, MD  Buprenorphine HCl-Naloxone HCl 8-2 MG FILM Take 1 film twice daily and 1/2 film at night 01/20/17   Axel Filler, MD  furosemide (LASIX) 40 MG tablet Take 1 tablet (40 mg total) by mouth daily as needed for fluid. 11/30/16   Sid Falcon, MD     Allergies:     Allergies  Allergen Reactions  . Mango Flavor Hives and Swelling  . Pyridium [Phenazopyridine Hcl] Other (See Comments)    Irritation to skin  . Tramadol Nausea And Vomiting     Physical Exam:   Vitals  Blood pressure (!) 133/102, pulse 78, temperature 98.7 F (37.1 C), temperature source Oral, resp. rate 18, height 5\' 1"  (1.549 m), weight 63.5 kg (140 lb), SpO2 99 %.   1. General  lying in bed in NAD,    2. Normal affect and insight, Not Suicidal or Homicidal, Awake Alert, Oriented X 3.  3. No F.N deficits, ALL C.Nerves Intact, Strength 5/5 all 4 extremities, Sensation intact all 4 extremities, Plantars down going.  4. Ears and Eyes appear Normal, Conjunctivae clear, PERRLA. Moist Oral Mucosa.  5. Supple Neck, No JVD, No cervical lymphadenopathy appriciated, No Carotid Bruits.  6. Symmetrical Chest wall movement, Good air movement bilaterally, CTAB.  7. RRR, No Gallops, Rubs or Murmurs, No Parasternal Heave.  8. Positive Bowel Sounds, Abdomen Soft, No  tenderness, No organomegaly appriciated,No rebound -guarding or rigidity.  9.  No Cyanosis, Redness about  3cm over over the dorsum of the right wrist.  1.5cm raised area of redness on the right forearm,  0.5cm skin ulcer on the medial maleolus of the left ankle  10. Good muscle tone,  joints appear normal , no effusions, Normal ROM.  11. No Palpable Lymph Nodes in Neck or Axillae No janeway, no osler no splinter    Data Review:    CBC Recent Labs  Lab 05/04/17 0307  WBC 8.8  HGB 10.3*  HCT 32.8*  PLT 337  MCV 76.8*  MCH 24.1*  MCHC 31.4  RDW 18.4*  LYMPHSABS 3.7  MONOABS 0.6  EOSABS 0.1  BASOSABS 0.0   ------------------------------------------------------------------------------------------------------------------  Chemistries  Recent Labs  Lab 05/04/17 0307  NA 135  K 3.6  CL 100*  CO2 27  GLUCOSE 106*  BUN 14  CREATININE 0.83  CALCIUM 8.5*  AST 33  ALT 34  ALKPHOS 63  BILITOT 0.3   ------------------------------------------------------------------------------------------------------------------ estimated creatinine clearance is 73.1 mL/min (by C-G formula based on SCr of 0.83 mg/dL). ------------------------------------------------------------------------------------------------------------------ No results for input(s): TSH, T4TOTAL, T3FREE, THYROIDAB in the last 72 hours.  Invalid input(s): FREET3  Coagulation profile No results for input(s): INR, PROTIME in the last 168 hours. ------------------------------------------------------------------------------------------------------------------- No results for input(s): DDIMER in the last 72 hours. -------------------------------------------------------------------------------------------------------------------  Cardiac Enzymes No results for input(s): CKMB, TROPONINI, MYOGLOBIN in the last 168 hours.  Invalid input(s):  CK ------------------------------------------------------------------------------------------------------------------ No results found for: BNP   ---------------------------------------------------------------------------------------------------------------  Urinalysis    Component Value Date/Time   COLORURINE YELLOW 10/14/2016 2009   APPEARANCEUR CLEAR 10/14/2016 2009   LABSPEC 1.032 (H) 10/14/2016 2009   PHURINE 6.0 10/14/2016 2009   GLUCOSEU >=500 (A) 10/14/2016 2009   HGBUR NEGATIVE 10/14/2016 2009   BILIRUBINUR NEGATIVE 10/14/2016 2009   KETONESUR NEGATIVE 10/14/2016 2009   PROTEINUR NEGATIVE 10/14/2016 2009   UROBILINOGEN 0.2 10/18/2014 0100   NITRITE NEGATIVE 10/14/2016 2009   LEUKOCYTESUR NEGATIVE 10/14/2016 2009    ----------------------------------------------------------------------------------------------------------------   Imaging Results:    Dg Ankle Complete Left  Result Date: 05/04/2017 CLINICAL DATA:  Abscess over the medial malleolus EXAM: LEFT ANKLE COMPLETE - 3+ VIEW COMPARISON:  01/28/2008 FINDINGS: No fracture or malalignment. Ankle mortise is symmetric. No periostitis. Small plantar calcaneal spur. 8 mm possible foreign body within the anterior heel pad. Soft tissue ulcer over the medial malleolar region. No soft tissue gas IMPRESSION: 1. No acute osseous abnormality 2. 8 mm linear foreign body within the anterior heel. Electronically Signed   By: Donavan Foil M.D.   On: 05/04/2017 03:28       Assessment & Plan:    Principal Problem:   Cellulitis    Cellulitis of the right wrist Blood culture x2 vanco iv, zosyn iv pharmacy to dose  Subjective fever Blood culture x2 Cardiac echo  Anemia Repeat cbc in am  Severe protein calorie malnutrition Prostat Check cmp in am     DVT Prophylaxis   Lovenox - SCDs  AM Labs Ordered, also please review Full Orders  Family Communication: Admission, patients condition and plan of care including  tests being ordered have been discussed with the patient  who indicate understanding and agree with the plan and Code Status.  Code Status full code  Likely DC to  home  Condition GUARDED    Consults called: none  Admission status: observation   Time spent in minutes : 45   Jani Gravel M.D on 05/04/2017 at 5:59 AM  Between 7am to 7pm - Pager - [phone]  After 7pm go to [email or web address] - password [facility]  [facility] - Office  [phone]

## 2017-05-05 ENCOUNTER — Observation Stay (HOSPITAL_COMMUNITY): Payer: Medicare Other

## 2017-05-05 DIAGNOSIS — L03019 Cellulitis of unspecified finger: Secondary | ICD-10-CM | POA: Diagnosis not present

## 2017-05-05 DIAGNOSIS — F1110 Opioid abuse, uncomplicated: Secondary | ICD-10-CM | POA: Diagnosis not present

## 2017-05-05 DIAGNOSIS — O99320 Drug use complicating pregnancy, unspecified trimester: Secondary | ICD-10-CM

## 2017-05-05 DIAGNOSIS — L0291 Cutaneous abscess, unspecified: Secondary | ICD-10-CM | POA: Diagnosis not present

## 2017-05-05 DIAGNOSIS — M79601 Pain in right arm: Secondary | ICD-10-CM | POA: Diagnosis not present

## 2017-05-05 DIAGNOSIS — F1910 Other psychoactive substance abuse, uncomplicated: Secondary | ICD-10-CM

## 2017-05-05 DIAGNOSIS — M79602 Pain in left arm: Secondary | ICD-10-CM | POA: Diagnosis not present

## 2017-05-05 DIAGNOSIS — M7989 Other specified soft tissue disorders: Secondary | ICD-10-CM | POA: Diagnosis not present

## 2017-05-05 LAB — CBC
HCT: 30.9 % — ABNORMAL LOW (ref 36.0–46.0)
Hemoglobin: 9.7 g/dL — ABNORMAL LOW (ref 12.0–15.0)
MCH: 23.9 pg — ABNORMAL LOW (ref 26.0–34.0)
MCHC: 31.4 g/dL (ref 30.0–36.0)
MCV: 76.1 fL — ABNORMAL LOW (ref 78.0–100.0)
Platelets: 311 10*3/uL (ref 150–400)
RBC: 4.06 MIL/uL (ref 3.87–5.11)
RDW: 18.9 % — ABNORMAL HIGH (ref 11.5–15.5)
WBC: 8.3 10*3/uL (ref 4.0–10.5)

## 2017-05-05 LAB — COMPREHENSIVE METABOLIC PANEL
ALT: 34 U/L (ref 14–54)
AST: 35 U/L (ref 15–41)
Albumin: 2.6 g/dL — ABNORMAL LOW (ref 3.5–5.0)
Alkaline Phosphatase: 51 U/L (ref 38–126)
Anion gap: 8 (ref 5–15)
BUN: 15 mg/dL (ref 6–20)
CO2: 23 mmol/L (ref 22–32)
Calcium: 8.3 mg/dL — ABNORMAL LOW (ref 8.9–10.3)
Chloride: 103 mmol/L (ref 101–111)
Creatinine, Ser: 0.67 mg/dL (ref 0.44–1.00)
GFR calc Af Amer: >60 mL/min (ref >60)
GFR calc non Af Amer: >60 mL/min (ref >60)
Glucose, Bld: 122 mg/dL — ABNORMAL HIGH (ref 65–99)
Potassium: 4.1 mmol/L (ref 3.5–5.1)
Sodium: 134 mmol/L — ABNORMAL LOW (ref 135–145)
Total Bilirubin: 0.2 mg/dL — ABNORMAL LOW (ref 0.3–1.2)
Total Protein: 7.5 g/dL (ref 6.5–8.1)

## 2017-05-05 MED ORDER — DOXYCYCLINE HYCLATE 100 MG PO CAPS: 100.0000 mg | ORAL_CAPSULE | Freq: Two times a day (BID) | ORAL | 0 refills | Status: AC

## 2017-05-05 MED ORDER — LIDOCAINE HCL (PF) 1 % IJ SOLN
30.0000 mL | Freq: Once | INTRAMUSCULAR | Status: AC
  Administered 2017-05-05: 30 mL via INTRADERMAL
  Filled 2017-05-05: qty 30

## 2017-05-05 NOTE — Procedures (Signed)
[facility] Procedure Note  05/05/17  Preoperative Diagnosis: Left forearm abscess    Postoperative Diagnosis: Same   Procedure(s) Performed: Incision and drainage     Surgeon: Lanell Matar. Constance Haw, MD   Assistants: No qualified resident was available   Anesthesia: 1% lidocaine    Wound Class: Dirty/ Infected    Findings: Purulence expressed   Indications: Ms. Garrott is a 47 yo with IV drug abuse and multiple abscesses on her extremities from the IV drug injections. She gave verbal consent for I&D at the bedside of the left forearm abscess after a discussion of the risk and benefits including bleeding, control of the infection, etc.  She refused to allow any of the other areas to be drained or cleaned up.    Procedure: The patient's left arm was cleaned with chloraprep. The area around the 2cm abscess was infiltrated with 1% lidocaine.  A small incision was made with a scapula, and 2cc of purulence was drained. The patient would not allow me to break up loculations. The area was irrigated and packed with Iodoform gauze. A clean gauze dressing was applied.  She can removed the packing in 24 hours. Needs to go home on antibiotics. Follow up PRN if no improvement.     Curlene Labrum, MD [facility] [address] [place], [place] [address] [phone] (office)

## 2017-05-05 NOTE — Consult Note (Signed)
[facility] Consult  Reason for Consult: Abscesses on forearm  Referring Physician:  Dr. Jerilee Hoh   Chief Complaint    Abscess      [name] is a 47 y.o. female.  HPI: [name] is a 47 yo with a history of IV drug use that presented to the hospital with multiple areas on bilateral forearms/ wrist and the left ankle concerning for cellulitis/ abscess from her IV injections. She was found to have a foreign body in the left ankle below the draining infection, and multiple areas on her arm. She denies fevers or chills. But feels sick from withdrawal from heroin.   Past Medical History:  Diagnosis Date  . Anxiety   . Chronic back pain   . Chronic knee pain   . COPD (chronic obstructive pulmonary disease) ([place])   . DDD (degenerative disc disease)   . 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 Hepatitis C infection 07/13/2014  . IV drug abuse ([place])   . Nephrolithiasis   . Panic attacks   . Polysubstance abuse ([facility])    cocaine, opiates, marijuana, amphetamines, "molly," crystal meth  . Pulmonary embolism ([place])   . Septic arthritis of right sternoclavicular joint ([place]) 02/21/2016    Past Surgical History:  Procedure Laterality Date  . APPLICATION OF A-CELL OF CHEST/ABDOMEN Right 09/07/2016   Procedure: APPLICATION OF A-CELL OF CHEST/ABDOMEN;  Surgeon: Wallace Going, DO;  Location: [place];  Service: Plastics;  Laterality: Right;  . CESAREAN SECTION    . I&D EXTREMITY Right 09/02/2016   Procedure: IRRIGATION AND DEBRIDEMENT OF RIGHT STERNOCLAVICULAR JOINT, STERNUM AND RIBS;  Surgeon: Gaye Pollack, MD;  Location: [facility];  Service: Vascular;  Laterality: Right;  . INCISION AND DRAINAGE OF WOUND Right 09/07/2016   Procedure: IRRIGATION AND DEBRIDEMENT OF CHEST WOUND, PLACEMENT OF A CELL AND VAC;  Surgeon: Wallace Going, DO;  Location: [facility];  Service: Plastics;  Laterality: Right;  . NOSE SURGERY    . STERNAL WOUND DEBRIDEMENT N/A 02/22/2016   Procedure: IRRIGATION AND DEBRIDEMENT MANUBRIUM ABSCESS with wound cultures;  Surgeon: Melrose Nakayama, MD;  Location: [facility];  Service: Thoracic;  Laterality: N/A;  . STERNAL WOUND DEBRIDEMENT N/A 02/23/2016   Procedure: DRESSING CHANGE UNDER ANESTHESIA  WITH WOUND VAC PLACEMENT;  Surgeon: Melrose Nakayama, MD;  Location: [facility];  Service: Thoracic;  Laterality: N/A;  . TEE WITHOUT CARDIOVERSION N/A 02/23/2016   Procedure: TRANSESOPHAGEAL ECHOCARDIOGRAM (TEE);  Surgeon: Jerline Pain, MD;  Location: [facility];  Service: Cardiovascular;  Laterality: N/A;  . TUBAL LIGATION     "tied, burned, clipped"    Family History  Problem Relation Age of Onset  . COPD Mother   . Hypertension Father   . Heart attack Father   . Diabetes Father   . Arthritis Unknown   . Lung disease Unknown   . Cancer Unknown   . Asthma Unknown     Social History   Tobacco Use  . Smoking status: Current Every Day Smoker    Packs/day: 0.50    Years: 32.00    Pack years: 16.00    Types: Cigarettes  . Smokeless tobacco: Never Used  Substance Use Topics  . Alcohol use: No    Alcohol/week: 0.0 oz  . Drug use: Yes    Types: IV, Cocaine, Marijuana, Methamphetamines, Heroin, Other-see comments    Comment: h/o Crystal meth, "molly," narcotics     Medications: I have reviewed the patient's current medications. Allergies  as of 05/05/2017      Reactions   Mango Flavor Hives, Swelling   Pyridium [phenazopyridine Hcl] Other (See Comments)   Irritation to skin   Tramadol Nausea And Vomiting      Medication List    TAKE these medications   doxycycline 100 MG capsule Commonly known as:  VIBRAMYCIN Take 1 capsule (100 mg total) by mouth 2 (two) times daily for 10 days.        ROS:  A comprehensive review of systems was negative except for: Musculoskeletal: positive for multiple abscesses on the forearm, swollen and red  Blood pressure (!) 146/83, pulse 66, temperature 97.8 F (36.6 C),  temperature source Oral, resp. rate 20, height _0  (1.549 m), weight 163 lb 1.6 oz (74 kg), SpO2 99 %. Physical Exam  Constitutional: She is oriented to person, place, and time and well-developed, well-nourished, and in no distress.  HENT:  Head: Normocephalic.  Eyes: Pupils are equal, round, and reactive to light.  Neck: Normal range of motion.  Cardiovascular: Normal rate.  Pulmonary/Chest: Effort normal.  Abdominal: Soft.  Musculoskeletal: Normal range of motion.  Left forearm with swollen, fluctuant area 2cm, not draining from the open hole close to it, left ankle with swollen red area over the malleolus medially, scab with some purulence with pressure, wrist on right dorsal surface with small areas, right volar forearm with some track marks but no redness or drainage, track marks throughout extremities   Neurological: She is alert and oriented to person, place, and time.  Skin: Skin is warm and dry.  Psychiatric: Mood, memory and affect normal.  Vitals reviewed.  Left forearm (I&D performed after picture):   Right wrist:    Right forearm:    Left ankle:    Results: Results for orders placed or performed during the hospital encounter of 05/04/17 (from the past 48 hour(s))  Urinalysis, Routine w reflex microscopic     Status: Abnormal   Collection Time: 05/04/17  1:43 AM  Result Value Ref Range   Color, Urine COLORLESS (A) YELLOW   APPearance CLEAR CLEAR   Specific Gravity, Urine 1.002 (L) 1.005 - 1.030   pH 7.0 5.0 - 8.0   Glucose, UA NEGATIVE NEGATIVE mg/dL   Hgb urine dipstick NEGATIVE NEGATIVE   Bilirubin Urine NEGATIVE NEGATIVE   Ketones, ur NEGATIVE NEGATIVE mg/dL   Protein, ur NEGATIVE NEGATIVE mg/dL   Nitrite NEGATIVE NEGATIVE   Leukocytes, UA TRACE (A) NEGATIVE   RBC / HPF 0-5 0 - 5 RBC/hpf   WBC, UA 0-5 0 - 5 WBC/hpf   Bacteria, UA NONE SEEN NONE SEEN   Squamous Epithelial / LPF 0-5 (A) NONE SEEN   Mucus PRESENT   Urine rapid drug screen (hosp  performed)     Status: Abnormal   Collection Time: 05/04/17  1:43 AM  Result Value Ref Range   Opiates POSITIVE (A) NONE DETECTED   Cocaine NONE DETECTED NONE DETECTED   Benzodiazepines NONE DETECTED NONE DETECTED   Amphetamines NONE DETECTED NONE DETECTED   Tetrahydrocannabinol NONE DETECTED NONE DETECTED   Barbiturates NONE DETECTED NONE DETECTED    Comment: (NOTE) DRUG SCREEN FOR MEDICAL PURPOSES ONLY.  IF CONFIRMATION IS NEEDED FOR ANY PURPOSE, NOTIFY LAB WITHIN 5 DAYS. LOWEST DETECTABLE LIMITS FOR URINE DRUG SCREEN Drug Class                     Cutoff (ng/mL) Amphetamine and metabolites    1000 Barbiturate and metabolites  200 Benzodiazepine                 779 Tricyclics and metabolites     300 Opiates and metabolites        300 Cocaine and metabolites        300 THC                            50   Comprehensive metabolic panel     Status: Abnormal   Collection Time: 05/04/17  3:07 AM  Result Value Ref Range   Sodium 135 135 - 145 mmol/L   Potassium 3.6 3.5 - 5.1 mmol/L   Chloride 100 (L) 101 - 111 mmol/L   CO2 27 22 - 32 mmol/L   Glucose, Bld 106 (H) 65 - 99 mg/dL   BUN 14 6 - 20 mg/dL   Creatinine, Ser 0.83 0.44 - 1.00 mg/dL   Calcium 8.5 (L) 8.9 - 10.3 mg/dL   Total Protein 8.1 6.5 - 8.1 g/dL   Albumin 3.0 (L) 3.5 - 5.0 g/dL   AST 33 15 - 41 U/L   ALT 34 14 - 54 U/L   Alkaline Phosphatase 63 38 - 126 U/L   Total Bilirubin 0.3 0.3 - 1.2 mg/dL   GFR calc non Af Amer >60 >60 mL/min   GFR calc Af Amer >60 >60 mL/min    Comment: (NOTE) The eGFR has been calculated using the CKD EPI equation. This calculation has not been validated in all clinical situations. eGFR's persistently <60 mL/min signify possible Chronic Kidney Disease.    Anion gap 8 5 - 15  CBC with Differential     Status: Abnormal   Collection Time: 05/04/17  3:07 AM  Result Value Ref Range   WBC 8.8 4.0 - 10.5 K/uL   RBC 4.27 3.87 - 5.11 MIL/uL   Hemoglobin 10.3 (L) 12.0 - 15.0 g/dL   HCT  32.8 (L) 36.0 - 46.0 %   MCV 76.8 (L) 78.0 - 100.0 fL   MCH 24.1 (L) 26.0 - 34.0 pg   MCHC 31.4 30.0 - 36.0 g/dL   RDW 18.4 (H) 11.5 - 15.5 %   Platelets 337 150 - 400 K/uL   Neutrophils Relative % 49 %   Neutro Abs 4.4 1.7 - 7.7 K/uL   Lymphocytes Relative 42 %   Lymphs Abs 3.7 0.7 - 4.0 K/uL   Monocytes Relative 6 %   Monocytes Absolute 0.6 0.1 - 1.0 K/uL   Eosinophils Relative 2 %   Eosinophils Absolute 0.1 0.0 - 0.7 K/uL   Basophils Relative 1 %   Basophils Absolute 0.0 0.0 - 0.1 K/uL  Sedimentation rate     Status: Abnormal   Collection Time: 05/04/17  3:07 AM  Result Value Ref Range   Sed Rate 40 (H) 0 - 22 mm/hr  C-reactive protein     Status: Abnormal   Collection Time: 05/04/17  3:07 AM  Result Value Ref Range   CRP 3.5 (H) <1.0 mg/dL    Comment: Performed at [facility], [address] [place], [place] [address]  Culture, blood (routine x 2)     Status: None (Preliminary result)   Collection Time: 05/04/17  5:57 AM  Result Value Ref Range   Specimen Description BLOOD LEFT ARM DRAWN BY RN    Special Requests      BOTTLES DRAWN AEROBIC ONLY Blood Culture adequate volume   Culture NO GROWTH  1 DAY    Report Status PENDING   Culture, blood (routine x 2)     Status: None (Preliminary result)   Collection Time: 05/04/17  6:23 AM  Result Value Ref Range   Specimen Description BLOOD RIGHT ARM    Special Requests      BOTTLES DRAWN AEROBIC AND ANAEROBIC Blood Culture adequate volume   Culture NO GROWTH 1 DAY    Report Status PENDING   Comprehensive metabolic panel     Status: Abnormal   Collection Time: 05/05/17  7:54 AM  Result Value Ref Range   Sodium 134 (L) 135 - 145 mmol/L   Potassium 4.1 3.5 - 5.1 mmol/L   Chloride 103 101 - 111 mmol/L   CO2 23 22 - 32 mmol/L   Glucose, Bld 122 (H) 65 - 99 mg/dL   BUN 15 6 - 20 mg/dL   Creatinine, Ser 0.67 0.44 - 1.00 mg/dL   Calcium 8.3 (L) 8.9 - 10.3 mg/dL   Total Protein 7.5 6.5 - 8.1 g/dL   Albumin 2.6 (L) 3.5 -  5.0 g/dL   AST 35 15 - 41 U/L   ALT 34 14 - 54 U/L   Alkaline Phosphatase 51 38 - 126 U/L   Total Bilirubin 0.2 (L) 0.3 - 1.2 mg/dL   GFR calc non Af Amer >60 >60 mL/min   GFR calc Af Amer >60 >60 mL/min    Comment: (NOTE) The eGFR has been calculated using the CKD EPI equation. This calculation has not been validated in all clinical situations. eGFR's persistently <60 mL/min signify possible Chronic Kidney Disease.    Anion gap 8 5 - 15  CBC     Status: Abnormal   Collection Time: 05/05/17  7:54 AM  Result Value Ref Range   WBC 8.3 4.0 - 10.5 K/uL   RBC 4.06 3.87 - 5.11 MIL/uL   Hemoglobin 9.7 (L) 12.0 - 15.0 g/dL   HCT 30.9 (L) 36.0 - 46.0 %   MCV 76.1 (L) 78.0 - 100.0 fL   MCH 23.9 (L) 26.0 - 34.0 pg   MCHC 31.4 30.0 - 36.0 g/dL   RDW 18.9 (H) 11.5 - 15.5 %   Platelets 311 150 - 400 K/uL    Dg Ankle Complete Left  Result Date: 05/04/2017 CLINICAL DATA:  Abscess over the medial malleolus EXAM: LEFT ANKLE COMPLETE - 3+ VIEW COMPARISON:  01/28/2008 FINDINGS: No fracture or malalignment. Ankle mortise is symmetric. No periostitis. Small plantar calcaneal spur. 8 mm possible foreign body within the anterior heel pad. Soft tissue ulcer over the medial malleolar region. No soft tissue gas IMPRESSION: 1. No acute osseous abnormality 2. 8 mm linear foreign body within the anterior heel. Electronically Signed   By: Donavan Foil M.D.   On: 05/04/2017 03:28   [place] Rt Upper Extrem Ltd Soft Tissue Non Vascular  Result Date: 05/05/2017 CLINICAL DATA:  IV drug abuser with pain and swelling on both arms. Question abscess. EXAM: ULTRASOUND RIGHT UPPER EXTREMITY LIMITED TECHNIQUE: Ultrasound examination of the upper extremity soft tissues was performed in the area of clinical concern. COMPARISON:  None. FINDINGS: Scanning was directed toward the regions of concern. In the subcutaneous tissues of the wrist, there is a fluid collection containing debris which measures 1.7 x 0.7 by 0.9 cm consistent  with a subcutaneous abscess. A second subcutaneous collection in the mid forearm measures 1.0 x 0.4 x 0.6 cm. No other abnormality is identified. IMPRESSION: 2 small fluid collections in the region of  concern consistent with abscesses. Electronically Signed   By: Inge Rise M.D.   On: 05/05/2017 09:52   [place] Lt Upper Extrem Ltd Soft Tissue Non Vascular  Result Date: 05/05/2017 CLINICAL DATA:  IV drug abuser with pain and swelling in both forearms. Question abscess. EXAM: ULTRASOUND LEFT UPPER EXTREMITY LIMITED TECHNIQUE: Ultrasound examination of the upper extremity soft tissues was performed in the area of clinical concern. COMPARISON:  None. FINDINGS: Scanning was directed toward the region of concern. In the subcutaneous tissues of the left forearm inferior to the antecubital fossa, there is a complex fluid collection containing a large amount of debris and measuring 1.6 x 0.9 x 1.2 cm. The sonographer reports this lesion is deep to a scab and is palpable. No other abnormality is identified. IMPRESSION: Findings consistent with a small abscess or hematoma in the region of concern. Electronically Signed   By: Inge Rise M.D.   On: 05/05/2017 09:54     Assessment & Plan:  ALMA MOHIUDDIN is a 47 y.o. female with multiple areas of infection on her forearms and the left ankle. The left ankle abscess has drained and has  Scab over the area. The left forearm is the largest at 1.6X0.9 X1.2 cm that is red and infected. Other fluid collections on the right side are not clinically infected with no erythema or drainage at the [facility] on the right.  -I&D of the left forearm wound done at the bedside see procedure note  -Antibiotics per Dr. Jerilee Hoh -Foreign body of the left ankle not close to the draining abscess currently, would not go to great measures to remove as this would be like looking for a needle in a haystack -Debris and fluid in the forearms but no definitive foreign bodies/ needles  -Encouraged  drug cessation/ rehab if possible    All questions were answered to the satisfaction of the patient.    Virl Cagey 05/05/2017, 12:40 PM

## 2017-05-05 NOTE — Care Management Obs Status (Signed)
[place] NOTIFICATION   Patient Details  Name: [name] MRN: [id number] Date of Birth: April 04, 1971   Medicare Observation Status Notification Given:  Yes    Sherald Barge, RN 05/05/2017, 11:14 AM

## 2017-05-05 NOTE — Discharge Summary (Signed)
Physician Discharge Summary  [name] IDP:[id number] DOB: 07-07-1970 DOA: 05/04/2017  PCP: Patient, [name]  Admit date: 05/04/2017 Discharge date: 05/05/2017  Time spent: 45 minutes  Recommendations for Outpatient Follow-up:  -Will be discharged home today. -Social work has obtained an appointment for patient on the afternoon of discharge with the addiction clinic in [place].  Patient is interested in Suboxone for treatment of heroin addiction.  Discharge Diagnoses:  Principal Problem:   Cellulitis Active Problems:   IVDA (intravenous drug abuse) complicating pregnancy ([place])   Abscess of multiple sites   Discharge Condition: Stable and improved  Filed Weights   05/04/17 0110 05/04/17 0745 05/05/17 0500  Weight: 63.5 kg (140 lb) 74 kg (163 lb 3.2 oz) 74 kg (163 lb 1.6 oz)    History of present illness:  As per Dr. Maudie Mercury on 1/23: [name]  is a 47 y.o. female, w hx of IVDA apparently has c/o subjective fever for the past week. She is unable to tell how long the redness over the dorsum of right wrist as well as skin nodule on the right forearm has been there.or the skin ulcer on the left ankle.  Pt presented to ED for c/o skin ulcer.    In ED,  Dr. Tomi Bamberger thought that she heard a new murmer ?  Xray left ankle IMPRESSION: 1. No acute osseous abnormality 2. 8 mm linear foreign body within the anterior heel.  Na 135, K 3.6, Bun  14, Creatinine 0.83 Ast 33, Alt 34 Wbc 8.8, Hgb 10,3 Plt 337    Hospital Course:   Cellulitis -At site of IV drug injections.  This is most significant in her left forearm, however she also has an area over the dorsal aspect of her right wrist and the medial aspect of her left ankle.  -General surgery has performed an I&D of the larger abscess of the left forearm on day of discharge, only about 2 cc of purulent material was expressed. -Patient's blood cultures remain negative, 2D echo without signs of vegetation. -Patient's IV  antibiotics will be transitioned over to doxycycline which she will complete a 10-day course of following discharge.  IV drug abuse -Counseled on cessation. -She started to experience withdrawals while hospitalized and was given Suboxone which she tolerated well. -She is interested in attending an addiction clinic.  Social work was able to obtain an appointment for her the same afternoon of discharge.   Procedures:  Bedside I&D of left forearm R abscess on 1/24  Consultations:  General surgery, Dr. Constance Haw  Discharge Instructions  Discharge Instructions    Diet - low sodium heart healthy   Complete by:  As directed    Increase activity slowly   Complete by:  As directed      Allergies as of 05/05/2017      Reactions   Mango Flavor Hives, Swelling   Pyridium [phenazopyridine Hcl] Other (See Comments)   Irritation to skin   Tramadol Nausea And Vomiting      Medication List    TAKE these medications   doxycycline 100 MG capsule Commonly known as:  VIBRAMYCIN Take 1 capsule (100 mg total) by mouth 2 (two) times daily for 10 days.      Allergies  Allergen Reactions  . Mango Flavor Hives and Swelling  . Pyridium [Phenazopyridine Hcl] Other (See Comments)    Irritation to skin  . Tramadol Nausea And Vomiting      The results of significant diagnostics from  this hospitalization (including imaging, microbiology, ancillary and laboratory) are listed below for reference.    Significant Diagnostic Studies: Dg Ankle Complete Left  Result Date: 05/04/2017 CLINICAL DATA:  Abscess over the medial malleolus EXAM: LEFT ANKLE COMPLETE - 3+ VIEW COMPARISON:  01/28/2008 FINDINGS: No fracture or malalignment. Ankle mortise is symmetric. No periostitis. Small plantar calcaneal spur. 8 mm possible foreign body within the anterior heel pad. Soft tissue ulcer over the medial malleolar region. No soft tissue gas IMPRESSION: 1. No acute osseous abnormality 2. 8 mm linear foreign body  within the anterior heel. Electronically Signed   By: Donavan Foil M.D.   On: 05/04/2017 03:28   [place] Rt Upper Extrem Ltd Soft Tissue Non Vascular  Result Date: 05/05/2017 CLINICAL DATA:  IV drug abuser with pain and swelling on both arms. Question abscess. EXAM: ULTRASOUND RIGHT UPPER EXTREMITY LIMITED TECHNIQUE: Ultrasound examination of the upper extremity soft tissues was performed in the area of clinical concern. COMPARISON:  None. FINDINGS: Scanning was directed toward the regions of concern. In the subcutaneous tissues of the wrist, there is a fluid collection containing debris which measures 1.7 x 0.7 by 0.9 cm consistent with a subcutaneous abscess. A second subcutaneous collection in the mid forearm measures 1.0 x 0.4 x 0.6 cm. No other abnormality is identified. IMPRESSION: 2 small fluid collections in the region of concern consistent with abscesses. Electronically Signed   By: Inge Rise M.D.   On: 05/05/2017 09:52   [place] Lt Upper Extrem Ltd Soft Tissue Non Vascular  Result Date: 05/05/2017 CLINICAL DATA:  IV drug abuser with pain and swelling in both forearms. Question abscess. EXAM: ULTRASOUND LEFT UPPER EXTREMITY LIMITED TECHNIQUE: Ultrasound examination of the upper extremity soft tissues was performed in the area of clinical concern. COMPARISON:  None. FINDINGS: Scanning was directed toward the region of concern. In the subcutaneous tissues of the left forearm inferior to the antecubital fossa, there is a complex fluid collection containing a large amount of debris and measuring 1.6 x 0.9 x 1.2 cm. The sonographer reports this lesion is deep to a scab and is palpable. No other abnormality is identified. IMPRESSION: Findings consistent with a small abscess or hematoma in the region of concern. Electronically Signed   By: Inge Rise M.D.   On: 05/05/2017 09:54    Microbiology: Recent Results (from the past 240 hour(s))  Culture, blood (routine x 2)     Status: None (Preliminary  result)   Collection Time: 05/04/17  5:57 AM  Result Value Ref Range Status   Specimen Description BLOOD LEFT ARM DRAWN BY RN  Final   Special Requests   Final    BOTTLES DRAWN AEROBIC ONLY Blood Culture adequate volume   Culture NO GROWTH 1 DAY  Final   Report Status PENDING  Incomplete  Culture, blood (routine x 2)     Status: None (Preliminary result)   Collection Time: 05/04/17  6:23 AM  Result Value Ref Range Status   Specimen Description BLOOD RIGHT ARM  Final   Special Requests   Final    BOTTLES DRAWN AEROBIC AND ANAEROBIC Blood Culture adequate volume   Culture NO GROWTH 1 DAY  Final   Report Status PENDING  Incomplete     Labs: Basic Metabolic Panel: Recent Labs  Lab 05/04/17 0307 05/05/17 0754  NA 135 134*  K 3.6 4.1  CL 100* 103  CO2 27 23  GLUCOSE 106* 122*  BUN 14 15  CREATININE 0.83 0.67  CALCIUM  8.5* 8.3*   Liver Function Tests: Recent Labs  Lab 05/04/17 0307 05/05/17 0754  AST 33 35  ALT 34 34  ALKPHOS 63 51  BILITOT 0.3 0.2*  PROT 8.1 7.5  ALBUMIN 3.0* 2.6*   No results for input(s): LIPASE, AMYLASE in the last 168 hours. No results for input(s): AMMONIA in the last 168 hours. CBC: Recent Labs  Lab 05/04/17 0307 05/05/17 0754  WBC 8.8 8.3  NEUTROABS 4.4  --   HGB 10.3* 9.7*  HCT 32.8* 30.9*  MCV 76.8* 76.1*  PLT 337 311   Cardiac Enzymes: No results for input(s): CKTOTAL, CKMB, CKMBINDEX, TROPONINI in the last 168 hours. BNP: BNP (last 3 results) No results for input(s): BNP in the last 8760 hours.  ProBNP (last 3 results) No results for input(s): PROBNP in the last 8760 hours.  CBG: No results for input(s): GLUCAP in the last 168 hours.     Signed:  Lelon Frohlich  [facility] Pager: [phone] 05/05/2017, 4:30 PM

## 2017-05-05 NOTE — Clinical Social Work Note (Signed)
Clinical Social Work Assessment  Patient Details  Name: [name] MRN: [id number] Date of Birth: 06-23-1970  Date of referral:  05/05/17               Reason for consult:  Substance Use/ETOH Abuse                Permission sought to share information with:  Facility Art therapist granted to share information::  Yes, Verbal Permission Granted  Name::     Dr Valetta Close  Agency::     Relationship::     Contact Information:     Housing/Transportation Living arrangements for the past 2 months:  [place] of Information:  Patient Patient Interpreter Needed:  None Criminal Activity/Legal Involvement Pertinent to Current Situation/Hospitalization:  No - Comment as needed Significant Relationships:  Parents Lives with:    Do you feel safe going back to the place where you live?  Yes Need for family participation in patient care:  No (Coment)  Care giving concerns: Pt struggling with heroin addiction.   Social Worker assessment / plan: Received consult to see pt for AODA issues. Pt is a 47 year old female who uses IV heroin. Met with pt to explain SW role and assess. Pt states she has a long history of drug abuse. Pt has been in treatment several times. She states that the only thing she has found to be at all helpful is Suboxone treatment. Discussed Dr. Romeo Apple local Suboxone treatment clinic and pt requests SW assistance in making an appointment. Called the clinic from pt's room and spoke directly with Dr. Valetta Close who stated that if pt gets there before 2pm today, they can assess her and get her in treatment. Otherwise, the next appointment is Monday next week. Pt states if she gets discharged, she can get there by 2. Spoke with MD who stated she would put her dc orders in. Updated pt's RN. Pt states she will call her dad and he will drive her to the clinic. Pt has written information on the clinic and address and phone number. Support and encouragement offered to  pt. There are no other SW needs for dc.  Employment status:  Disabled (Comment on whether or not currently receiving Disability) Insurance information:  Medicare PT Recommendations:  Not assessed at this time Information / Referral to community resources:  Outpatient Substance Abuse Treatment Options  Patient/Family's Response to care: Pt accepting of care.  Patient/Family's Understanding of and Emotional Response to Diagnosis, Current Treatment, and Prognosis: Pt appears to have a good understanding of her diagnosis and treatment recommendations. Pt is wanting treatment. She appears emotionally distressed. Support provided.  Emotional Assessment Appearance:  Appears older than stated age Attitude/Demeanor/Rapport:  Gracious Affect (typically observed):  Accepting, Calm, Flat, Pleasant Orientation:  Oriented to Self, Oriented to Place, Oriented to  Time, Oriented to Situation Alcohol / Substance use:  Illicit Drugs Psych involvement (Current and /or in the community):  No (Comment)  Discharge Needs  Concerns to be addressed:  Substance Abuse Concerns Readmission within the last 30 days:  No Current discharge risk:  Substance Abuse Barriers to Discharge:  Active Substance Use   Shade Flood, LCSW 05/05/2017, 12:09 PM

## 2017-05-09 LAB — CULTURE, BLOOD (ROUTINE X 2)
Culture: NO GROWTH
Culture: NO GROWTH
Special Requests: ADEQUATE
Special Requests: ADEQUATE

## 2017-06-17 DIAGNOSIS — M791 Myalgia, unspecified site: Secondary | ICD-10-CM | POA: Diagnosis not present

## 2017-06-17 DIAGNOSIS — M542 Cervicalgia: Secondary | ICD-10-CM | POA: Diagnosis not present

## 2017-06-17 DIAGNOSIS — L03221 Cellulitis of neck: Secondary | ICD-10-CM | POA: Diagnosis not present

## 2017-07-07 IMAGING — DX DG CHEST 2V
2 series · 2 of 2 positions shown · non-contrast
Comparison: 09/25/2016 CT

CLINICAL DATA: Fever

EXAM:
CHEST  2 VIEW

[chest pa]
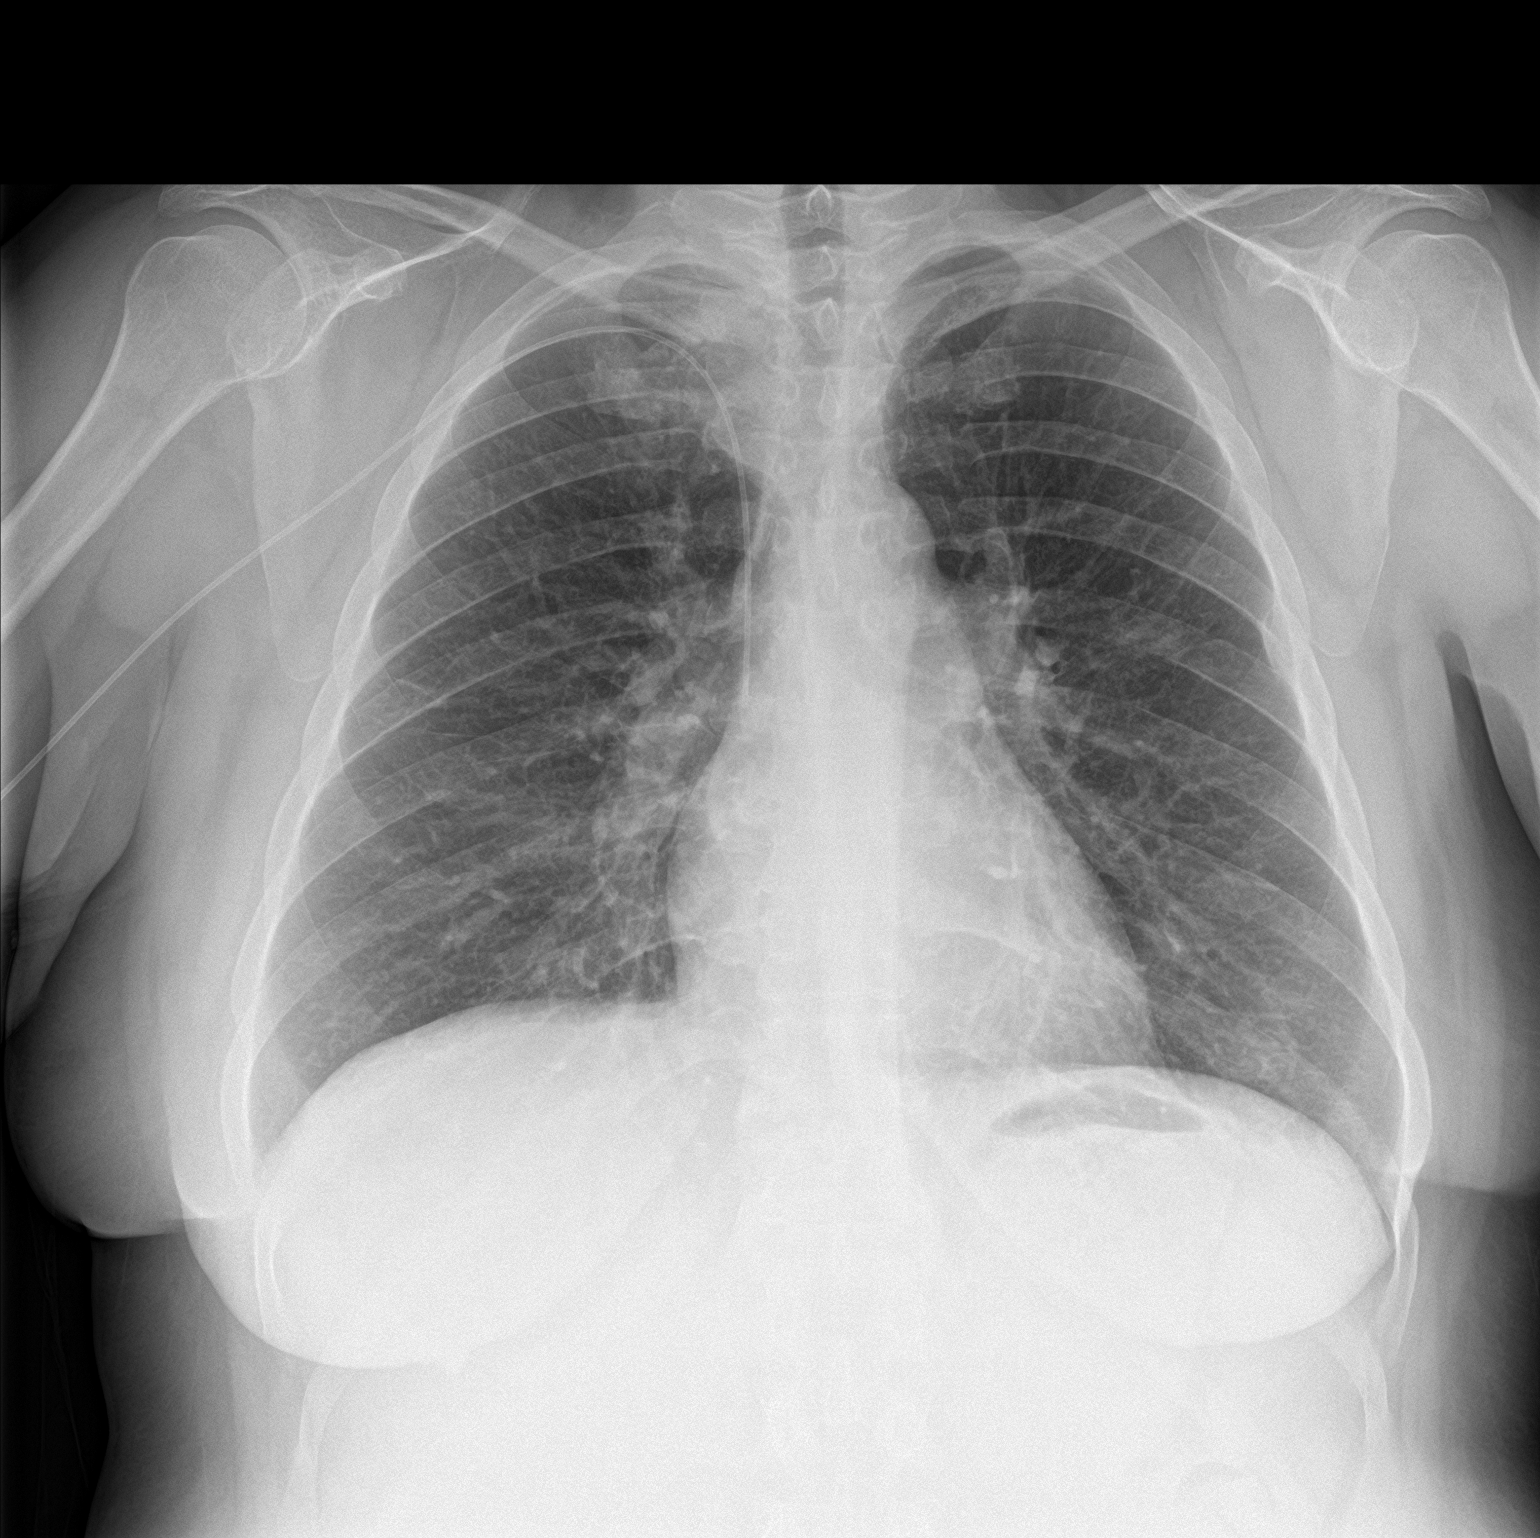

[chest lat]
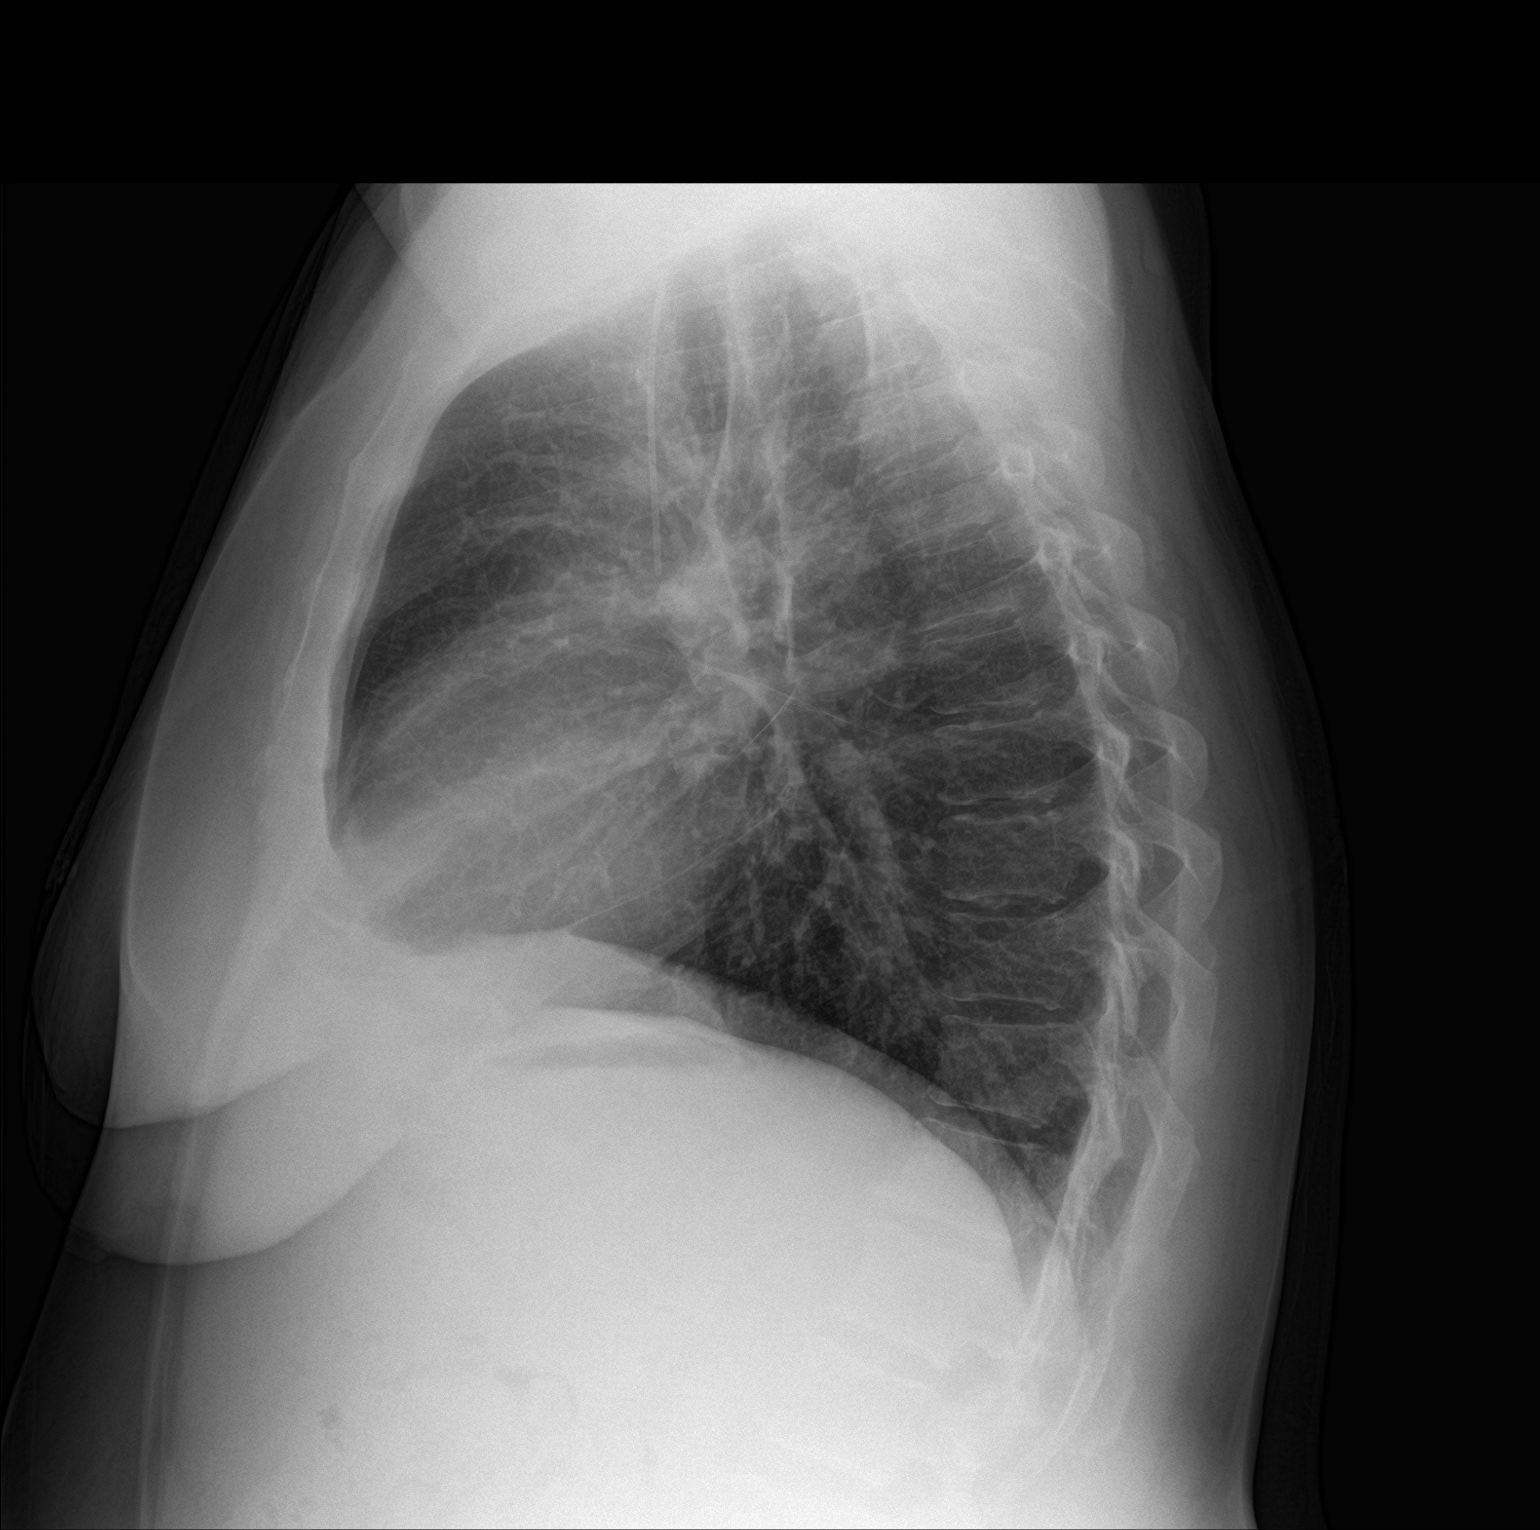

[2 of 2 positions shown; findings below may reference images not displayed]

FINDINGS: Right PICC line tip is in the SVC. Heart and mediastinal contours
are within normal limits. No focal opacities or effusions. No acute
bony abnormality.
IMPRESSION: No active cardiopulmonary disease.

## 2017-08-04 DIAGNOSIS — F17210 Nicotine dependence, cigarettes, uncomplicated: Secondary | ICD-10-CM | POA: Diagnosis not present

## 2017-08-04 DIAGNOSIS — L02612 Cutaneous abscess of left foot: Secondary | ICD-10-CM | POA: Diagnosis not present

## 2017-09-23 ENCOUNTER — Emergency Department (HOSPITAL_COMMUNITY): Payer: Medicare Other

## 2017-09-23 ENCOUNTER — Emergency Department (HOSPITAL_COMMUNITY)
Admission: EM | Admit: 2017-09-23 | Discharge: 2017-09-24 | Disposition: A | Discharge status: Home / Self Care | Payer: Medicare Other | Attending: Emergency Medicine | Admitting: Emergency Medicine

## 2017-09-23 ENCOUNTER — Other Ambulatory Visit: Payer: Self-pay

## 2017-09-23 ENCOUNTER — Encounter (HOSPITAL_COMMUNITY): Payer: Self-pay

## 2017-09-23 DIAGNOSIS — R1084 Generalized abdominal pain: Secondary | ICD-10-CM | POA: Diagnosis not present

## 2017-09-23 DIAGNOSIS — D259 Leiomyoma of uterus, unspecified: Secondary | ICD-10-CM | POA: Diagnosis not present

## 2017-09-23 DIAGNOSIS — F17210 Nicotine dependence, cigarettes, uncomplicated: Secondary | ICD-10-CM | POA: Insufficient documentation

## 2017-09-23 DIAGNOSIS — R110 Nausea: Secondary | ICD-10-CM | POA: Diagnosis not present

## 2017-09-23 DIAGNOSIS — J449 Chronic obstructive pulmonary disease, unspecified: Secondary | ICD-10-CM | POA: Diagnosis not present

## 2017-09-23 DIAGNOSIS — A599 Trichomoniasis, unspecified: Secondary | ICD-10-CM | POA: Diagnosis not present

## 2017-09-23 DIAGNOSIS — Z049 Encounter for examination and observation for unspecified reason: Secondary | ICD-10-CM | POA: Diagnosis not present

## 2017-09-23 DIAGNOSIS — R102 Pelvic and perineal pain: Secondary | ICD-10-CM | POA: Diagnosis present

## 2017-09-23 DIAGNOSIS — N730 Acute parametritis and pelvic cellulitis: Secondary | ICD-10-CM | POA: Diagnosis not present

## 2017-09-23 DIAGNOSIS — D251 Intramural leiomyoma of uterus: Secondary | ICD-10-CM | POA: Diagnosis not present

## 2017-09-23 LAB — LIPASE, BLOOD: Lipase: 32 U/L (ref 11–51)

## 2017-09-23 LAB — URINALYSIS, ROUTINE W REFLEX MICROSCOPIC
Bilirubin Urine: NEGATIVE
Glucose, UA: NEGATIVE mg/dL
Hgb urine dipstick: NEGATIVE
Ketones, ur: NEGATIVE mg/dL
Nitrite: NEGATIVE
Protein, ur: NEGATIVE mg/dL
Specific Gravity, Urine: 1.016 (ref 1.005–1.030)
pH: 6 (ref 5.0–8.0)

## 2017-09-23 LAB — WET PREP, GENITAL
Sperm: NONE SEEN
Yeast Wet Prep HPF POC: NONE SEEN

## 2017-09-23 LAB — CBC
HCT: 39.7 % (ref 36.0–46.0)
Hemoglobin: 12.7 g/dL (ref 12.0–15.0)
MCH: 25.8 pg — ABNORMAL LOW (ref 26.0–34.0)
MCHC: 32 g/dL (ref 30.0–36.0)
MCV: 80.5 fL (ref 78.0–100.0)
Platelets: 346 10*3/uL (ref 150–400)
RBC: 4.93 MIL/uL (ref 3.87–5.11)
RDW: 18.5 % — ABNORMAL HIGH (ref 11.5–15.5)
WBC: 11.7 10*3/uL — ABNORMAL HIGH (ref 4.0–10.5)

## 2017-09-23 LAB — POC URINE PREG, ED: Preg Test, Ur: NEGATIVE

## 2017-09-23 MED ORDER — ONDANSETRON HCL 4 MG/2ML IJ SOLN
4.0000 mg | Freq: Once | INTRAMUSCULAR | Status: AC
  Administered 2017-09-23: 4 mg via INTRAVENOUS
  Filled 2017-09-23: qty 2

## 2017-09-23 MED ORDER — CEFTRIAXONE SODIUM 250 MG IJ SOLR
250.0000 mg | Freq: Once | INTRAMUSCULAR | Status: AC
  Administered 2017-09-23: 250 mg via INTRAMUSCULAR
  Filled 2017-09-23: qty 250

## 2017-09-23 MED ORDER — DOXYCYCLINE HYCLATE 100 MG PO TABS
100.0000 mg | ORAL_TABLET | Freq: Once | ORAL | Status: AC
  Administered 2017-09-23: 100 mg via ORAL
  Filled 2017-09-23: qty 1

## 2017-09-23 MED ORDER — METRONIDAZOLE 500 MG PO TABS
500.0000 mg | ORAL_TABLET | Freq: Once | ORAL | Status: AC
  Administered 2017-09-23: 500 mg via ORAL
  Filled 2017-09-23: qty 1

## 2017-09-23 MED ORDER — MORPHINE SULFATE (PF) 4 MG/ML IV SOLN: 4.0000 mg | Freq: Once | INTRAVENOUS | Status: DC

## 2017-09-23 MED ORDER — KETOROLAC TROMETHAMINE 30 MG/ML IJ SOLN
30.0000 mg | Freq: Once | INTRAMUSCULAR | Status: AC
  Administered 2017-09-23: 30 mg via INTRAVENOUS
  Filled 2017-09-23: qty 1

## 2017-09-23 MED ORDER — LIDOCAINE HCL (PF) 1 % IJ SOLN
INTRAMUSCULAR | Status: AC
  Filled 2017-09-23: qty 2

## 2017-09-23 MED ORDER — ACETAMINOPHEN 500 MG PO TABS
1000.0000 mg | ORAL_TABLET | Freq: Once | ORAL | Status: AC
  Administered 2017-09-23: 1000 mg via ORAL
  Filled 2017-09-23: qty 2

## 2017-09-23 MED ORDER — SODIUM CHLORIDE 0.9 % IV BOLUS
1000.0000 mL | Freq: Once | INTRAVENOUS | Status: AC
  Administered 2017-09-23: 1000 mL via INTRAVENOUS

## 2017-09-23 NOTE — ED Triage Notes (Signed)
Pt in by [facility] for abd pain and nausea x 1 week

## 2017-09-23 NOTE — ED Triage Notes (Signed)
Pt states the pain she is having for a week feels like the pain she has had in the past with ovarian cysts.

## 2017-09-23 NOTE — ED Provider Notes (Signed)
[name] Provider Note   CSN: [id number] Arrival date & time: 09/23/17  1946     History   Chief Complaint Chief Complaint  Patient presents with  . Abdominal Pain    HPI [name] is a 47 y.o. female.  [name] is a 47 y.o. Female with a history of COPD, chronic back and knee pain, endocarditis, PE and polysubstance abuse, who presents to the emergency department for evaluation of 1 week of lower abdominal and pelvic pain.  Patient reports and feels similar to ovarian cyst she had a long time ago, pain is a sharp intense sensation in the lower abdomen and pelvic region that tends to come and go, but has becoming increasingly more frequent over the past week.  Patient reports she is felt nauseated with this pain but has not had any episodes of emesis, reports some subjective chills but no fevers.  She denies diarrhea, melena or hematochezia.  She denies dysuria or urinary frequency.  Denies vaginal discharge or bleeding, reports she is sexually active but is in a monogamous relationship, does not use protection.  Last menstrual period 2 years ago.  Patient has not tried anything to treat her symptoms prior to arrival.  Reports she has a long history of heroin abuse but has been clean for the past 2 weeks, is not using methadone.     Past Medical History:  Diagnosis Date  . Anxiety   . Chronic back pain   . Chronic knee pain   . COPD (chronic obstructive pulmonary disease) ([place])   . DDD (degenerative disc disease)   . 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 Hepatitis C infection 07/13/2014  . IV drug abuse ([place])   . Nephrolithiasis   . Panic attacks   . Polysubstance abuse ([facility])    cocaine, opiates, marijuana, amphetamines, "molly," crystal meth  . Pulmonary embolism ([place])   . Septic arthritis of right sternoclavicular joint ([place]) 02/21/2016    Patient Active Problem List   Diagnosis Date Noted  . Cellulitis 05/04/2017  . Dental infection  01/06/2017  . Abscess of multiple sites   . Hepatitis C infection 07/13/2014  . IVDA (intravenous drug abuse) complicating pregnancy ([place])   . Opioid use disorder, severe, dependence ([place]) 06/17/2014    Past Surgical History:  Procedure Laterality Date  . APPLICATION OF A-CELL OF CHEST/ABDOMEN Right 09/07/2016   Procedure: APPLICATION OF A-CELL OF CHEST/ABDOMEN;  Surgeon: Wallace Going, DO;  Location: [place];  Service: Plastics;  Laterality: Right;  . CESAREAN SECTION    . I&D EXTREMITY Right 09/02/2016   Procedure: IRRIGATION AND DEBRIDEMENT OF RIGHT STERNOCLAVICULAR JOINT, STERNUM AND RIBS;  Surgeon: Gaye Pollack, MD;  Location: [facility];  Service: Vascular;  Laterality: Right;  . INCISION AND DRAINAGE OF WOUND Right 09/07/2016   Procedure: IRRIGATION AND DEBRIDEMENT OF CHEST WOUND, PLACEMENT OF A CELL AND VAC;  Surgeon: Wallace Going, DO;  Location: [place];  Service: Plastics;  Laterality: Right;  . NOSE SURGERY    . STERNAL WOUND DEBRIDEMENT N/A 02/22/2016   Procedure: IRRIGATION AND DEBRIDEMENT MANUBRIUM ABSCESS with wound cultures;  Surgeon: Melrose Nakayama, MD;  Location: [place];  Service: Thoracic;  Laterality: N/A;  . STERNAL WOUND DEBRIDEMENT N/A 02/23/2016   Procedure: DRESSING CHANGE UNDER ANESTHESIA  WITH WOUND VAC PLACEMENT;  Surgeon: Melrose Nakayama, MD;  Location: [place];  Service: Thoracic;  Laterality: N/A;  . TEE WITHOUT CARDIOVERSION N/A 02/23/2016  Procedure: TRANSESOPHAGEAL ECHOCARDIOGRAM (TEE);  Surgeon: Jerline Pain, MD;  Location: [facility] ENDOSCOPY;  Service: Cardiovascular;  Laterality: N/A;  . TUBAL LIGATION     "tied, burned, clipped"     OB History    Gravida  2   Para  2   Term  2   Preterm      AB      Living  2     SAB      TAB      Ectopic      Multiple      Live Births               Home Medications    Prior to Admission medications   Not on File    Family History Family History  Problem Relation Age of  Onset  . COPD Mother   . Hypertension Father   . Heart attack Father   . Diabetes Father   . Arthritis Unknown   . Lung disease Unknown   . Cancer Unknown   . Asthma Unknown     Social History Social History   Tobacco Use  . Smoking status: Current Every Day Smoker    Packs/day: 0.50    Years: 32.00    Pack years: 16.00    Types: Cigarettes  . Smokeless tobacco: Never Used  Substance Use Topics  . Alcohol use: No    Alcohol/week: 0.0 oz  . Drug use: Yes    Types: IV, Cocaine, Marijuana, Methamphetamines, Heroin, Other-see comments    Comment: h/o Crystal meth, "molly," narcotics      Allergies   Mango flavor; Pyridium [phenazopyridine hcl]; and Tramadol   Review of Systems Review of Systems  Constitutional: Positive for chills. Negative for fever.  HENT: Negative.   Eyes: Negative for visual disturbance.  Respiratory: Negative for cough and shortness of breath.   Cardiovascular: Negative for chest pain.  Gastrointestinal: Positive for abdominal pain and nausea. Negative for blood in stool, constipation, diarrhea and vomiting.  Genitourinary: Positive for pelvic pain. Negative for dysuria, frequency, vaginal bleeding and vaginal discharge.  Musculoskeletal: Negative for arthralgias and myalgias.  Skin: Negative for color change and rash.  Neurological: Negative for dizziness, syncope and light-headedness.     Physical Exam Updated Vital Signs BP 117/76 (BP Location: Left Arm)   Pulse 69   Temp 97.8 F (36.6 C) (Oral)   Resp 20   Ht 5' (1.524 m)   Wt 77.1 kg (170 lb)   LMP 07/01/2016   SpO2 98%   BMI 33.20 kg/m   Physical Exam  Constitutional: She appears well-developed and well-nourished.  Non-toxic appearance. She does not appear ill. No distress.  HENT:  Head: Normocephalic and atraumatic.  Mouth/Throat: Oropharynx is clear and moist.  Eyes: Right eye exhibits no discharge. Left eye exhibits no discharge.  Cardiovascular: Normal rate, regular  rhythm, normal heart sounds and intact distal pulses.  Pulmonary/Chest: Effort normal and breath sounds normal. No stridor. No respiratory distress. She has no wheezes. She has no rhonchi. She has no rales.  Respirations equal and unlabored, patient able to speak in full sentences, lungs clear to auscultation bilaterally  Abdominal: Soft. Normal appearance and bowel sounds are normal. There is no rigidity, no guarding and no CVA tenderness.  Abdomen soft, nondistended, does not throughout, there is mild tenderness across the lower abdomen without guarding or rebound tenderness, no palpable masses, no CVA tenderness  Genitourinary:  Genitourinary Comments: Chaperone present during pelvic exam No external  genital lesions noted Large amount of yellow discharge present in the vaginal vault, with evidence of cervicitis, there is minimal cervical motion tenderness on bimanual exam, with tenderness to palpation over the uterus and right adnexa, no palpable masses  Neurological: She is alert. Coordination normal.  Skin: Skin is warm and dry. Capillary refill takes less than 2 seconds. She is not diaphoretic.  Psychiatric: She has a normal mood and affect. Her behavior is normal.  Nursing note and vitals reviewed.    ED Treatments / Results  Labs (all labs ordered are listed, but only abnormal results are displayed) Labs Reviewed  WET PREP, GENITAL - Abnormal; Notable for the following components:      Result Value   Trich, Wet Prep MANY (*)    WBC, Wet Prep HPF POC MODERATE (*)    All other components within normal limits  COMPREHENSIVE METABOLIC PANEL - Abnormal; Notable for the following components:   Chloride 100 (*)    Total Protein 8.7 (*)    AST 141 (*)    ALT 259 (*)    Total Bilirubin 0.2 (*)    All other components within normal limits  CBC - Abnormal; Notable for the following components:   WBC 11.7 (*)    MCH 25.8 (*)    RDW 18.5 (*)    All other components within normal limits   URINALYSIS, ROUTINE W REFLEX MICROSCOPIC - Abnormal; Notable for the following components:   APPearance CLOUDY (*)    Leukocytes, UA LARGE (*)    Bacteria, UA RARE (*)    All other components within normal limits  RAPID URINE DRUG SCREEN, HOSP PERFORMED - Abnormal; Notable for the following components:   Barbiturates   (*)    Value: Result not available. Reagent lot number recalled by manufacturer.   All other components within normal limits  URINE CULTURE  LIPASE, BLOOD  HIV ANTIBODY (ROUTINE TESTING)  RPR  POC URINE PREG, ED  GC/CHLAMYDIA PROBE AMP ([place]) NOT AT [facility]    EKG None  Radiology US Transvaginal Non-ob  Result Date: 09/23/2017 CLINICAL DATA:  Initial evaluation for acute pelvic pain. EXAM: TRANSABDOMINAL AND TRANSVAGINAL ULTRASOUND OF PELVIS DOPPLER ULTRASOUND OF OVARIES TECHNIQUE: Both transabdominal and transvaginal ultrasound examinations of the pelvis were performed. Transabdominal technique was performed for global imaging of the pelvis including uterus, ovaries, adnexal regions, and pelvic cul-de-sac. It was necessary to proceed with endovaginal exam following the transabdominal exam to visualize the uterus, endometrium, and ovaries. Color and duplex Doppler ultrasound was utilized to evaluate blood flow to the ovaries. COMPARISON:  Prior CT from 12/18/2013. FINDINGS: Uterus Measurements: 7.7 x 3.8 x 6.2 cm. 1.3 x 1.2 x 1.4 cm fibroid present at the posterior uterine fundus. Endometrium Complex hypoechoic area involving the endometrial stripe at the level of the uterine fundus measures approximately 2.1 x 1.9 x 1.7 cm, which appears somewhat oblong in appearance on transverse imaging. Finding favored to reflect hypoechoic/complex fluid within the endometrial canal. No internal vascularity within this structure, although there is diffusely increased vascularity within the surrounding myometrium. Inferiorly, endometrial stripe measures approximately 5 mm in thickness  at the level the lower uterine segment. Right ovary Measurements: 3.9 x 1.1 x 2.6 cm. Normal appearance/no adnexal mass. Left ovary Measurements: 3.4 x 1.9 x 2.2 cm. Normal appearance/no adnexal mass. Pulsed Doppler evaluation of both ovaries demonstrates normal low-resistance arterial and venous waveforms. Other findings No abnormal free fluid. IMPRESSION: 1. Ill-defined 2.1 x 1.9 x 1.7 cm hypoechoic  structure at the fundal aspect of the endometrial complex, favored to reflect complex fluid within the endometrial cavity. Increased vascularity within the surrounding myometrium. Finding raises the possibility for an acute infectious process. Correlation with symptomatology and laboratory values recommended. Additionally, a short interval follow-up ultrasound in 6-12 weeks to ensure these changes resolve is recommended. 2. 1.4 cm intramural fibroid. 3. No other acute abnormality within the pelvis. No evidence for torsion. Electronically Signed   By: Jeannine Boga M.D.   On: 09/23/2017 23:12   US Pelvis Complete  Result Date: 09/23/2017 CLINICAL DATA:  Initial evaluation for acute pelvic pain. EXAM: TRANSABDOMINAL AND TRANSVAGINAL ULTRASOUND OF PELVIS DOPPLER ULTRASOUND OF OVARIES TECHNIQUE: Both transabdominal and transvaginal ultrasound examinations of the pelvis were performed. Transabdominal technique was performed for global imaging of the pelvis including uterus, ovaries, adnexal regions, and pelvic cul-de-sac. It was necessary to proceed with endovaginal exam following the transabdominal exam to visualize the uterus, endometrium, and ovaries. Color and duplex Doppler ultrasound was utilized to evaluate blood flow to the ovaries. COMPARISON:  Prior CT from 12/18/2013. FINDINGS: Uterus Measurements: 7.7 x 3.8 x 6.2 cm. 1.3 x 1.2 x 1.4 cm fibroid present at the posterior uterine fundus. Endometrium Complex hypoechoic area involving the endometrial stripe at the level of the uterine fundus measures  approximately 2.1 x 1.9 x 1.7 cm, which appears somewhat oblong in appearance on transverse imaging. Finding favored to reflect hypoechoic/complex fluid within the endometrial canal. No internal vascularity within this structure, although there is diffusely increased vascularity within the surrounding myometrium. Inferiorly, endometrial stripe measures approximately 5 mm in thickness at the level the lower uterine segment. Right ovary Measurements: 3.9 x 1.1 x 2.6 cm. Normal appearance/no adnexal mass. Left ovary Measurements: 3.4 x 1.9 x 2.2 cm. Normal appearance/no adnexal mass. Pulsed Doppler evaluation of both ovaries demonstrates normal low-resistance arterial and venous waveforms. Other findings No abnormal free fluid. IMPRESSION: 1. Ill-defined 2.1 x 1.9 x 1.7 cm hypoechoic structure at the fundal aspect of the endometrial complex, favored to reflect complex fluid within the endometrial cavity. Increased vascularity within the surrounding myometrium. Finding raises the possibility for an acute infectious process. Correlation with symptomatology and laboratory values recommended. Additionally, a short interval follow-up ultrasound in 6-12 weeks to ensure these changes resolve is recommended. 2. 1.4 cm intramural fibroid. 3. No other acute abnormality within the pelvis. No evidence for torsion. Electronically Signed   By: Jeannine Boga M.D.   On: 09/23/2017 23:12   [place] Art/ven Flow Abd Pelv Doppler  Result Date: 09/23/2017 CLINICAL DATA:  Initial evaluation for acute pelvic pain. EXAM: TRANSABDOMINAL AND TRANSVAGINAL ULTRASOUND OF PELVIS DOPPLER ULTRASOUND OF OVARIES TECHNIQUE: Both transabdominal and transvaginal ultrasound examinations of the pelvis were performed. Transabdominal technique was performed for global imaging of the pelvis including uterus, ovaries, adnexal regions, and pelvic cul-de-sac. It was necessary to proceed with endovaginal exam following the transabdominal exam to visualize  the uterus, endometrium, and ovaries. Color and duplex Doppler ultrasound was utilized to evaluate blood flow to the ovaries. COMPARISON:  Prior CT from 12/18/2013. FINDINGS: Uterus Measurements: 7.7 x 3.8 x 6.2 cm. 1.3 x 1.2 x 1.4 cm fibroid present at the posterior uterine fundus. Endometrium Complex hypoechoic area involving the endometrial stripe at the level of the uterine fundus measures approximately 2.1 x 1.9 x 1.7 cm, which appears somewhat oblong in appearance on transverse imaging. Finding favored to reflect hypoechoic/complex fluid within the endometrial canal. No internal vascularity within this structure, although there is diffusely  increased vascularity within the surrounding myometrium. Inferiorly, endometrial stripe measures approximately 5 mm in thickness at the level the lower uterine segment. Right ovary Measurements: 3.9 x 1.1 x 2.6 cm. Normal appearance/no adnexal mass. Left ovary Measurements: 3.4 x 1.9 x 2.2 cm. Normal appearance/no adnexal mass. Pulsed Doppler evaluation of both ovaries demonstrates normal low-resistance arterial and venous waveforms. Other findings No abnormal free fluid. IMPRESSION: 1. Ill-defined 2.1 x 1.9 x 1.7 cm hypoechoic structure at the fundal aspect of the endometrial complex, favored to reflect complex fluid within the endometrial cavity. Increased vascularity within the surrounding myometrium. Finding raises the possibility for an acute infectious process. Correlation with symptomatology and laboratory values recommended. Additionally, a short interval follow-up ultrasound in 6-12 weeks to ensure these changes resolve is recommended. 2. 1.4 cm intramural fibroid. 3. No other acute abnormality within the pelvis. No evidence for torsion. Electronically Signed   By: Jeannine Boga M.D.   On: 09/23/2017 23:12    Procedures Procedures (including critical care time)  Medications Ordered in ED Medications  lidocaine (PF) (XYLOCAINE) 1 % injection (has no  administration in time range)  sodium chloride 0.9 % bolus 1,000 mL (0 mLs Intravenous Stopped 09/23/17 2220)  ondansetron (ZOFRAN) injection 4 mg (4 mg Intravenous Given 09/23/17 2110)  ketorolac (TORADOL) 30 MG/ML injection 30 mg (30 mg Intravenous Given 09/23/17 2141)  cefTRIAXone (ROCEPHIN) injection 250 mg (250 mg Intramuscular Given 09/23/17 2332)  doxycycline (VIBRA-TABS) tablet 100 mg (100 mg Oral Given 09/23/17 2332)  metroNIDAZOLE (FLAGYL) tablet 500 mg (500 mg Oral Given 09/23/17 2332)  acetaminophen (TYLENOL) tablet 1,000 mg (1,000 mg Oral Given 09/23/17 2332)     Initial Impression / Assessment and Plan / ED Course  I have reviewed the triage vital signs and the nursing notes.  Pertinent labs & imaging results that were available during my care of the patient were reviewed by me and considered in my medical decision making (see chart for details).  Patient presents to the emergency department for 1 week of lower abdomen and pelvic pain with associated nausea.  Patient denies fevers, vomiting or diarrhea.  Denies urinary symptoms.  Patient denies vaginal discharge but on exam there is copious amounts of yellow discharge present in the vaginal vault with tenderness over the uterus and adnexa concerning for pelvic inflammatory disease.  Will get abdominal labs, wet prep and STD testing are pending.  Will get pelvic ultrasound to assess for any tubo-ovarian abscess, or ovarian cysts and to rule out torsion.  Fluids, Toradol and Zofran for symptomatic management as patient would like to avoid narcotic pain medication she is currently in recovery from polysubstance abuse.  Pain significantly improved after Toradol, no episodes of vomiting.  Labs significant for mild leukocytosis of 11.7, normal hemoglobin.  No acute electrolyte derangements, normal renal function, patient does have slight elevation LFTs, AST is 141 and ALT is 259, no elevation in alk phos or T. 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  Normal lipase.  Urinalysis with large leukocytes but rare bacteria.  Wet prep significant for moderate WBCs and trichomoniasis.  GC chlamydia, HIV and RPR pending.  UDS unremarkable.  Ultrasound with ill-defined hypoechoic structure in the fundal aspect of the endometrium suggestive of complex fluid collection, this is most indicative of an infectious process, which makes sense given clinical presentation suggestive of PID.  There is no evidence of tubo-ovarian abscess, no ovarian cysts or evidence of torsion.  There is a  large uterine fibroid present.  Discussed these results with the patient, given improvement in pain, stable vitals and patient is tolerating p.o. I feel like she would be a good candidate for outpatient treatment, patient given Rocephin as well as first doses of doxycycline and Flagyl here in the emergency department and tolerated well.  Stressed the importance of completing entire course of antibiotics.  Zofran and ibuprofen as needed for pain.  Patient follow-up with primary care as well as OB/GYN.  Strict return precautions discussed.  Patient expresses understanding and is in agreement with plan.  Final Clinical Impressions(s) / ED Diagnoses   Final diagnoses:  PID (acute pelvic inflammatory disease)  Trichomonas infection  Nausea  Uterine leiomyoma, unspecified location    ED Discharge Orders        Ordered    doxycycline (VIBRAMYCIN) 100 MG capsule  2 times daily     09/24/17 0012    metroNIDAZOLE (FLAGYL) 500 MG tablet  2 times daily     09/24/17 0012    ondansetron (ZOFRAN ODT) 4 MG disintegrating tablet     09/24/17 0012    ibuprofen (ADVIL,MOTRIN) 600 MG tablet  Every 6 hours PRN     09/24/17 0012       Jacqlyn Larsen, PA-C 09/24/17 0370    Daleen Bo, MD 09/24/17 1646

## 2017-09-24 LAB — COMPREHENSIVE METABOLIC PANEL
ALT: 259 U/L — ABNORMAL HIGH (ref 14–54)
AST: 141 U/L — ABNORMAL HIGH (ref 15–41)
Albumin: 3.6 g/dL (ref 3.5–5.0)
Alkaline Phosphatase: 75 U/L (ref 38–126)
BUN: 16 mg/dL (ref 6–20)
Calcium: 9 mg/dL (ref 8.9–10.3)
Chloride: 100 mmol/L — ABNORMAL LOW (ref 101–111)
Creatinine, Ser: 0.68 mg/dL (ref 0.44–1.00)
GFR calc Af Amer: >60 mL/min (ref >60)
GFR calc non Af Amer: >60 mL/min (ref >60)
Glucose, Bld: 99 mg/dL (ref 65–99)
Potassium: 4.5 mmol/L (ref 3.5–5.1)
Sodium: 135 mmol/L (ref 135–145)
Total Bilirubin: 0.2 mg/dL — ABNORMAL LOW (ref 0.3–1.2)
Total Protein: 8.7 g/dL — ABNORMAL HIGH (ref 6.5–8.1)

## 2017-09-24 LAB — RAPID URINE DRUG SCREEN, HOSP PERFORMED
Amphetamines: NOT DETECTED
Benzodiazepines: NOT DETECTED
Cocaine: NOT DETECTED
Opiates: NOT DETECTED
Tetrahydrocannabinol: NOT DETECTED

## 2017-09-24 MED ORDER — ONDANSETRON 4 MG PO TBDP: ORAL_TABLET | ORAL | 0 refills | Status: DC

## 2017-09-24 MED ORDER — IBUPROFEN 600 MG PO TABS: 600.0000 mg | ORAL_TABLET | Freq: Four times a day (QID) | ORAL | 0 refills | Status: DC | PRN

## 2017-09-24 MED ORDER — METRONIDAZOLE 500 MG PO TABS: 500.0000 mg | ORAL_TABLET | Freq: Two times a day (BID) | ORAL | 0 refills | Status: AC

## 2017-09-24 MED ORDER — DOXYCYCLINE HYCLATE 100 MG PO CAPS: 100.0000 mg | ORAL_CAPSULE | Freq: Two times a day (BID) | ORAL | 0 refills | Status: AC

## 2017-09-24 NOTE — Discharge Instructions (Signed)
Your symptoms are caused by pelvic inflammatory disease likely caused by a trichomonas infection, this is a sexually transmitted disease, please inform your partner and have them tested and treated.  You will need to take doxycycline and metronidazole twice daily for the next 2 weeks.  It is extremely important that you take the entire course of antibiotics or this could worsen and cause you to get sicker.  You may use ibuprofen as needed for pain, and Zofran as needed for nausea.  Your liver function tests were slightly elevated today that is common with pill give inflammatory disease, and is called Roxana Hires syndrome, please have your liver function tests rechecked after you complete your antibiotics.  If you have any worsening pain, persistent fevers, vomiting and or unable to keep down your antibiotics or any other new or concerning symptoms please return to the emergency department for reevaluation otherwise please follow-up with your regular doctor as well as the women's health clinic.

## 2017-09-25 LAB — URINE CULTURE

## 2017-09-25 LAB — HIV ANTIBODY (ROUTINE TESTING W REFLEX): HIV Screen 4th Generation wRfx: NONREACTIVE

## 2017-09-25 LAB — RPR: RPR Ser Ql: NONREACTIVE

## 2017-09-26 LAB — GC/CHLAMYDIA PROBE AMP (~~LOC~~) NOT AT ARMC
Chlamydia: NEGATIVE
Neisseria Gonorrhea: NEGATIVE

## 2018-01-31 ENCOUNTER — Other Ambulatory Visit: Payer: Self-pay

## 2018-01-31 ENCOUNTER — Encounter (HOSPITAL_COMMUNITY): Payer: Self-pay | Admitting: Emergency Medicine

## 2018-01-31 ENCOUNTER — Emergency Department (HOSPITAL_COMMUNITY)
Admission: EM | Admit: 2018-01-31 | Discharge: 2018-01-31 | Disposition: A | Discharge status: Home / Self Care | Payer: Medicare Other | Attending: Emergency Medicine | Admitting: Emergency Medicine

## 2018-01-31 DIAGNOSIS — Z79899 Other long term (current) drug therapy: Secondary | ICD-10-CM | POA: Diagnosis not present

## 2018-01-31 DIAGNOSIS — L02512 Cutaneous abscess of left hand: Secondary | ICD-10-CM | POA: Diagnosis not present

## 2018-01-31 DIAGNOSIS — L02519 Cutaneous abscess of unspecified hand: Secondary | ICD-10-CM

## 2018-01-31 DIAGNOSIS — M01X42 Direct infection of left hand in infectious and parasitic diseases classified elsewhere: Secondary | ICD-10-CM | POA: Diagnosis not present

## 2018-01-31 DIAGNOSIS — F17210 Nicotine dependence, cigarettes, uncomplicated: Secondary | ICD-10-CM | POA: Insufficient documentation

## 2018-01-31 DIAGNOSIS — L02511 Cutaneous abscess of right hand: Secondary | ICD-10-CM | POA: Insufficient documentation

## 2018-01-31 DIAGNOSIS — B0089 Other herpesviral infection: Secondary | ICD-10-CM | POA: Insufficient documentation

## 2018-01-31 DIAGNOSIS — M01X41 Direct infection of right hand in infectious and parasitic diseases classified elsewhere: Secondary | ICD-10-CM | POA: Diagnosis not present

## 2018-01-31 DIAGNOSIS — J449 Chronic obstructive pulmonary disease, unspecified: Secondary | ICD-10-CM | POA: Insufficient documentation

## 2018-01-31 DIAGNOSIS — Z049 Encounter for examination and observation for unspecified reason: Secondary | ICD-10-CM | POA: Diagnosis not present

## 2018-01-31 LAB — BASIC METABOLIC PANEL
Anion gap: 7 (ref 5–15)
BUN: 17 mg/dL (ref 6–20)
CO2: 23 mmol/L (ref 22–32)
Calcium: 8.5 mg/dL — ABNORMAL LOW (ref 8.9–10.3)
Chloride: 106 mmol/L (ref 98–111)
Creatinine, Ser: 0.72 mg/dL (ref 0.44–1.00)
GFR calc Af Amer: >60 mL/min (ref >60)
GFR calc non Af Amer: >60 mL/min (ref >60)
Glucose, Bld: 137 mg/dL — ABNORMAL HIGH (ref 70–99)
Potassium: 4.5 mmol/L (ref 3.5–5.1)
Sodium: 136 mmol/L (ref 135–145)

## 2018-01-31 LAB — CBC WITH DIFFERENTIAL/PLATELET
Abs Immature Granulocytes: 0.07 10*3/uL (ref 0.00–0.07)
Basophils Absolute: 0.1 10*3/uL (ref 0.0–0.1)
Basophils Relative: 1 %
Eosinophils Absolute: 0.2 10*3/uL (ref 0.0–0.5)
Eosinophils Relative: 2 %
HCT: 39.4 % (ref 36.0–46.0)
Hemoglobin: 12.2 g/dL (ref 12.0–15.0)
Immature Granulocytes: 1 %
Lymphocytes Relative: 27 %
Lymphs Abs: 3.5 10*3/uL (ref 0.7–4.0)
MCH: 26.5 pg (ref 26.0–34.0)
MCHC: 31 g/dL (ref 30.0–36.0)
MCV: 85.7 fL (ref 80.0–100.0)
Monocytes Absolute: 1 10*3/uL (ref 0.1–1.0)
Monocytes Relative: 8 %
Neutro Abs: 8.1 10*3/uL — ABNORMAL HIGH (ref 1.7–7.7)
Neutrophils Relative %: 61 %
Platelets: 365 10*3/uL (ref 150–400)
RBC: 4.6 MIL/uL (ref 3.87–5.11)
RDW: 15.9 % — ABNORMAL HIGH (ref 11.5–15.5)
WBC: 13 10*3/uL — ABNORMAL HIGH (ref 4.0–10.5)
nRBC: 0 % (ref 0.0–0.2)

## 2018-01-31 MED ORDER — LIDOCAINE HCL (PF) 2 % IJ SOLN
INTRAMUSCULAR | Status: AC
  Filled 2018-01-31: qty 20

## 2018-01-31 MED ORDER — LIDOCAINE HCL (PF) 2 % IJ SOLN
10.0000 mL | Freq: Once | INTRAMUSCULAR | Status: AC
  Administered 2018-01-31: 10 mL

## 2018-01-31 MED ORDER — PROMETHAZINE HCL 12.5 MG PO TABS
12.5000 mg | ORAL_TABLET | Freq: Once | ORAL | Status: AC
  Administered 2018-01-31: 12.5 mg via ORAL
  Filled 2018-01-31: qty 1

## 2018-01-31 MED ORDER — HYDROGEN PEROXIDE 3 % EX SOLN
CUTANEOUS | Status: AC
  Administered 2018-01-31: 11:00:00
  Filled 2018-01-31: qty 473

## 2018-01-31 MED ORDER — DOXYCYCLINE HYCLATE 100 MG PO CAPS: 100.0000 mg | ORAL_CAPSULE | Freq: Two times a day (BID) | ORAL | 0 refills | Status: DC

## 2018-01-31 MED ORDER — TRAMADOL HCL 50 MG PO TABS
100.0000 mg | ORAL_TABLET | Freq: Once | ORAL | Status: AC
  Administered 2018-01-31: 100 mg via ORAL
  Filled 2018-01-31: qty 2

## 2018-01-31 MED ORDER — IBUPROFEN 800 MG PO TABS
800.0000 mg | ORAL_TABLET | Freq: Once | ORAL | Status: AC
  Administered 2018-01-31: 800 mg via ORAL
  Filled 2018-01-31: qty 1

## 2018-01-31 MED ORDER — DOXYCYCLINE HYCLATE 100 MG PO TABS
100.0000 mg | ORAL_TABLET | Freq: Once | ORAL | Status: AC
  Administered 2018-01-31: 100 mg via ORAL
  Filled 2018-01-31: qty 1

## 2018-01-31 MED ORDER — CEPHALEXIN 250 MG PO CAPS: 250.0000 mg | ORAL_CAPSULE | Freq: Four times a day (QID) | ORAL | 0 refills | Status: DC

## 2018-01-31 MED ORDER — IBUPROFEN 600 MG PO TABS: 600.0000 mg | ORAL_TABLET | Freq: Four times a day (QID) | ORAL | 0 refills | Status: DC

## 2018-01-31 MED ORDER — CEFIXIME 400 MG PO CAPS
400.0000 mg | ORAL_CAPSULE | Freq: Once | ORAL | Status: AC
  Administered 2018-01-31: 400 mg via ORAL
  Filled 2018-01-31: qty 1

## 2018-01-31 NOTE — ED Triage Notes (Signed)
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

## 2018-01-31 NOTE — Discharge Instructions (Signed)
The abscess to your left hand has been drained.  Please change the dressing daily.  Please soak both hands in  basin of warm Epson salt water for 10 to 15 minutes daily until the wound has healed.  Use Keflex 4 times daily with meals and at bedtime, use doxycycline 2 times daily with food.  Continue your Suboxone.  Use 600 mg of ibuprofen and 500 mg of Tylenol with breakfast, lunch, dinner, and at bedtime.  Please have your hand rechecked in 3 days.

## 2018-01-31 NOTE — ED Provider Notes (Addendum)
[facility] EMERGENCY DEPARTMENT Provider Note   CSN: [id number] Arrival date & time: 01/31/18  7096     History   Chief Complaint Chief Complaint  Patient presents with  . Abscess    HPI [name] is a 47 y.o. female.  Patient is a 46 year old female who presents to the emergency department with an abscess of the right index finger and the left hand.  The patient has a history of IV drug abuse in the past.  She says she has been clean for the past 8 months.  She has a history of a septic arthritis, and endocarditis.  She also has a history of hepatitis C infection.  She says this infected area has been going on for the past 3 days.  Given her past history she says she thought she should come into the emergency department to get these look that as conservative measures at home have not been effective.  No nausea, no vomiting reported.  No high fever noted by the patient.  The history is provided by the patient.  Abscess    Past Medical History:  Diagnosis Date  . Anxiety   . Chronic back pain   . Chronic knee pain   . COPD (chronic obstructive pulmonary disease) ([place])   . DDD (degenerative disc disease)   . 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 Hepatitis C infection 07/13/2014  . IV drug abuse ([place])   . Nephrolithiasis   . Panic attacks   . Polysubstance abuse ([facility])    cocaine, opiates, marijuana, amphetamines, "molly," crystal meth  . Pulmonary embolism ([place])   . Septic arthritis of right sternoclavicular joint ([place]) 02/21/2016    Patient Active Problem List   Diagnosis Date Noted  . Cellulitis 05/04/2017  . Dental infection 01/06/2017  . Abscess of multiple sites   . Hepatitis C infection 07/13/2014  . IVDA (intravenous drug abuse) complicating pregnancy ([place])   . Opioid use disorder, severe, dependence ([place]) 06/17/2014    Past Surgical History:  Procedure Laterality Date  . APPLICATION OF A-CELL OF CHEST/ABDOMEN Right 09/07/2016   Procedure:  APPLICATION OF A-CELL OF CHEST/ABDOMEN;  Surgeon: Wallace Going, DO;  Location: [facility];  Service: Plastics;  Laterality: Right;  . CESAREAN SECTION    . I&D EXTREMITY Right 09/02/2016   Procedure: IRRIGATION AND DEBRIDEMENT OF RIGHT STERNOCLAVICULAR JOINT, STERNUM AND RIBS;  Surgeon: Gaye Pollack, MD;  Location: [facility];  Service: Vascular;  Laterality: Right;  . INCISION AND DRAINAGE OF WOUND Right 09/07/2016   Procedure: IRRIGATION AND DEBRIDEMENT OF CHEST WOUND, PLACEMENT OF A CELL AND VAC;  Surgeon: Wallace Going, DO;  Location: [facility];  Service: Plastics;  Laterality: Right;  . NOSE SURGERY    . STERNAL WOUND DEBRIDEMENT N/A 02/22/2016   Procedure: IRRIGATION AND DEBRIDEMENT MANUBRIUM ABSCESS with wound cultures;  Surgeon: Melrose Nakayama, MD;  Location: [facility];  Service: Thoracic;  Laterality: N/A;  . STERNAL WOUND DEBRIDEMENT N/A 02/23/2016   Procedure: DRESSING CHANGE UNDER ANESTHESIA  WITH WOUND VAC PLACEMENT;  Surgeon: Melrose Nakayama, MD;  Location: [facility];  Service: Thoracic;  Laterality: N/A;  . TEE WITHOUT CARDIOVERSION N/A 02/23/2016   Procedure: TRANSESOPHAGEAL ECHOCARDIOGRAM (TEE);  Surgeon: Jerline Pain, MD;  Location[facility];  Service: Cardiovascular;  Laterality: N/A;  . TUBAL LIGATION     "tied, burned, clipped"     OB History    Gravida  2   Para  2   Term  2   Preterm      AB      Living  2     SAB      TAB      Ectopic      Multiple      Live Births               Home Medications    Prior to Admission medications   Medication Sig Start Date End Date Taking? Authorizing Provider  ibuprofen (ADVIL,MOTRIN) 600 MG tablet Take 1 tablet (600 mg total) by mouth every 6 (six) hours as needed. 09/24/17   Jacqlyn Larsen, PA-C  ondansetron (ZOFRAN ODT) 4 MG disintegrating tablet 4mg  ODT q4 hours prn nausea/vomit 09/24/17   Jacqlyn Larsen, PA-C    Family History Family History  Problem Relation Age of Onset  . COPD Mother     . Hypertension Father   . Heart attack Father   . Diabetes Father   . Arthritis Unknown   . Lung disease Unknown   . Cancer Unknown   . Asthma Unknown     Social History Social History   Tobacco Use  . Smoking status: Current Every Day Smoker    Packs/day: 1.00    Years: 32.00    Pack years: 32.00    Types: Cigarettes  . Smokeless tobacco: Never Used  Substance Use Topics  . Alcohol use: No    Alcohol/week: 0.0 standard drinks  . Drug use: Yes    Types: IV, Cocaine, Marijuana, Methamphetamines, Heroin, Other-see comments    Comment: h/o Crystal meth, "molly," narcotics  clean for 8 months.     Allergies   Mango flavor and Pyridium [phenazopyridine hcl]   Review of Systems Review of Systems  Musculoskeletal:       Abscess     Physical Exam Updated Vital Signs BP 103/61 (BP Location: Left Arm)   Pulse (!) 59   Temp 98.2 F (36.8 C) (Oral)   Resp 18   Ht 5\' 1"  (1.549 m)   Wt 95.3 kg   LMP 07/01/2016   SpO2 96%   BMI 39.68 kg/m   Physical Exam  Constitutional: She is oriented to person, place, and time. She appears well-developed and well-nourished.  Non-toxic appearance.  HENT:  Head: Normocephalic.  Right Ear: Tympanic membrane and external ear normal.  Left Ear: Tympanic membrane and external ear normal.  Eyes: Pupils are equal, round, and reactive to light. EOM and lids are normal.  Neck: Normal range of motion. Neck supple. Carotid bruit is not present.  Cardiovascular: Normal rate, regular rhythm, normal heart sounds, intact distal pulses and normal pulses.  Pulmonary/Chest: Breath sounds normal. No respiratory distress.  Abdominal: Soft. Bowel sounds are normal. There is no tenderness. There is no guarding.  Musculoskeletal: Normal range of motion.  There is full range of motion of the right and left shoulder and elbow.  There is an abscess of the right index finger and the PIP joint area.  There is no red streak appreciated.  There is mild  swelling of the index finger.  Capillary refill is less than 2 seconds.  Radial pulses 2+.  There is an abscess of the left hand from the third MP joint extending over to the fifth MP joint area and the dorsum of the hand.  Both the right index finger and the left hand abscess area are red and warm.  No red streaks appreciated.  Lymphadenopathy:       Head (  right side): No submandibular adenopathy present.       Head (left side): No submandibular adenopathy present.    She has no cervical adenopathy.  Neurological: She is alert and oriented to person, place, and time. She has normal strength. No cranial nerve deficit or sensory deficit.  Skin: Skin is warm and dry.  Psychiatric: She has a normal mood and affect. Her speech is normal.  Nursing note and vitals reviewed.        ED Treatments / Results  Labs (all labs ordered are listed, but only abnormal results are displayed) Labs Reviewed  AEROBIC CULTURE (SUPERFICIAL SPECIMEN)  AEROBIC CULTURE (SUPERFICIAL SPECIMEN)  CBC WITH DIFFERENTIAL/PLATELET  BASIC METABOLIC PANEL    EKG None  Radiology No results found.  Procedures .Marland KitchenIncision and Drainage Date/Time: 01/31/2018 10:57 AM Performed by: Lily Kocher, PA-C Authorized by: Lily Kocher, PA-C   Consent:    Consent obtained:  Verbal   Consent given by:  Patient   Risks discussed:  Bleeding, incomplete drainage, infection and pain   Alternatives discussed:  Referral Universal protocol:    Procedure explained and questions answered to patient or proxy's satisfaction: yes     Immediately prior to procedure a time out was called: yes     Patient identity confirmed:  Arm band Location:    Type:  Abscess   Location:  Upper extremity   Upper extremity location:  Hand   Hand location:  L hand Pre-procedure details:    Skin preparation:  Betadine Anesthesia (see MAR for exact dosages):    Anesthesia method:  Local infiltration   Local anesthetic:  Lidocaine 2% w/o  epi Procedure type:    Complexity:  Simple Procedure details:    Incision types:  Single straight   Incision depth:  Subcutaneous   Scalpel blade:  11   Wound management:  Probed and deloculated, irrigated with saline and extensive cleaning   Drainage:  Bloody and purulent   Drainage amount:  Copious   Wound treatment:  Wound left open Post-procedure details:    Patient tolerance of procedure:  Tolerated well, no immediate complications   (including critical care time)  Medications Ordered in ED Medications  traMADol (ULTRAM) tablet 100 mg (has no administration in time range)  promethazine (PHENERGAN) tablet 12.5 mg (has no administration in time range)  ibuprofen (ADVIL,MOTRIN) tablet 800 mg (has no administration in time range)  lidocaine (XYLOCAINE) 2 % injection 10 mL (has no administration in time range)  cefixime (SUPRAX) capsule 400 mg (has no administration in time range)  doxycycline (VIBRA-TABS) tablet 100 mg (has no administration in time range)     Initial Impression / Assessment and Plan / ED Course  I have reviewed the triage vital signs and the nursing notes.  Pertinent labs & imaging results that were available during my care of the patient were reviewed by me and considered in my medical decision making (see chart for details).       Final Clinical Impressions(s) / ED Diagnoses MDM  Vital signs reviewed.  Pulse oximetry is 96% on room air.  Within normal limits by my interpretation. The complete blood count shows white blood cells to be elevated at 13,000, there is no shift to the left.  The basic metabolic panel shows the glucose to be elevated at 137 otherwise within normal limits.  Further evaluation after cleansing the skin the area with the right index finger shows some blisters extending from the MP joint area to the webspace between  the index finger and the middle finger.  There is also a blister at the base of the index finger near the webspace  between the thumb and the index finger.  I suspect that this may be a herpetic whitlow, and will not perform an incision and drainage on this area at this time.  This area will be covered however with antibiotics in the event there is an infection in the area.  I have asked the patient to have this area rechecked in about 3 days.  Incision and drainage carried out for the abscess of the left hand.  Copious purulent material removed.  Culture sent to the lab.  Patient will be placed on Keflex and doxycycline.  Patient will have this wound rechecked in about 3 days.  Patient is recovering from IV drug use, on Suboxone.  Patient asked to use 600 mg of ibuprofen and 500 mg of Tylenol with breakfast, lunch, dinner, and at bedtime for soreness.  Questions answered.  Feel that it is safe for the patient to be discharged home.   Final diagnoses:  Abscess, hand  Herpetic whitlow    ED Discharge Orders         Ordered    cephALEXin (KEFLEX) 250 MG capsule  4 times daily     01/31/18 1053    doxycycline (VIBRAMYCIN) 100 MG capsule  2 times daily     01/31/18 1053    ibuprofen (ADVIL,MOTRIN) 600 MG tablet  4 times daily     01/31/18 1053           Lily Kocher, PA-C 01/31/18 1103    Sherwood Gambler, MD 01/31/18 1456    Lily Kocher, PA-C 01/31/18 2124    Sherwood Gambler, MD 02/01/18 [phone]

## 2018-02-03 LAB — AEROBIC CULTURE W GRAM STAIN (SUPERFICIAL SPECIMEN)

## 2018-02-03 LAB — AEROBIC CULTURE  (SUPERFICIAL SPECIMEN)

## 2018-02-04 ENCOUNTER — Telehealth: Payer: Self-pay

## 2018-02-04 NOTE — Progress Notes (Signed)
ED Antimicrobial Stewardship Positive Culture Follow Up   [name] is an 47 y.o. female who presented to [facility] on 01/31/2018 with a chief complaint of abscess on both hands Chief Complaint  Patient presents with  . Abscess    Recent Results (from the past 720 hour(s))  Wound or Superficial Culture     Status: None   Collection Time: 01/31/18 10:50 AM  Result Value Ref Range Status   Specimen Description   Final    WOUND Performed at [facility], [address], [place], [place] [address]    Special Requests   Final    [facility] Performed at [facility], [address] [place], [place] [address]    Gram Stain   Final    ABUNDANT WBC PRESENT, PREDOMINANTLY PMN FEW GRAM POSITIVE COCCI Performed at [facility], [place] [address], [place],  [address]    Culture   Final    ABUNDANT STREPTOCOCCUS PYOGENES FEW STAPHYLOCOCCUS AUREUS FEW SERRATIA MARCESCENS FEW KLEBSIELLA PNEUMONIAE    Report Status 02/03/2018 FINAL  Final   Organism ID, Bacteria STAPHYLOCOCCUS AUREUS  Final   Organism ID, Bacteria SERRATIA MARCESCENS  Final   Organism ID, Bacteria KLEBSIELLA PNEUMONIAE  Final      Susceptibility   Klebsiella pneumoniae - MIC*    AMPICILLIN RESISTANT Resistant     CEFAZOLIN <=4 SENSITIVE Sensitive     CEFEPIME <=1 SENSITIVE Sensitive     CEFTAZIDIME <=1 SENSITIVE Sensitive     CEFTRIAXONE <=1 SENSITIVE Sensitive     CIPROFLOXACIN <=0.25 SENSITIVE Sensitive     GENTAMICIN <=1 SENSITIVE Sensitive     IMIPENEM <=0.25 SENSITIVE Sensitive     TRIMETH/SULFA >=320 RESISTANT Resistant     AMPICILLIN/SULBACTAM <=2 SENSITIVE Sensitive     PIP/TAZO <=4 SENSITIVE Sensitive     Extended ESBL NEGATIVE Sensitive     * FEW KLEBSIELLA PNEUMONIAE   Staphylococcus aureus - MIC*    CIPROFLOXACIN <=0.5 SENSITIVE Sensitive     ERYTHROMYCIN >=8 RESISTANT Resistant     GENTAMICIN <=0.5 SENSITIVE Sensitive     OXACILLIN <=0.25 SENSITIVE Sensitive     TETRACYCLINE <=1 SENSITIVE  Sensitive     VANCOMYCIN <=0.5 SENSITIVE Sensitive     TRIMETH/SULFA <=10 SENSITIVE Sensitive     CLINDAMYCIN RESISTANT Resistant     RIFAMPIN <=0.5 SENSITIVE Sensitive     Inducible Clindamycin POSITIVE Resistant     * FEW STAPHYLOCOCCUS AUREUS   Serratia marcescens - MIC*    CEFAZOLIN >=64 RESISTANT Resistant     CEFEPIME <=1 SENSITIVE Sensitive     CEFTAZIDIME <=1 SENSITIVE Sensitive     CEFTRIAXONE <=1 SENSITIVE Sensitive     CIPROFLOXACIN <=0.25 SENSITIVE Sensitive     GENTAMICIN <=1 SENSITIVE Sensitive     TRIMETH/SULFA <=20 SENSITIVE Sensitive     * FEW SERRATIA MARCESCENS    [x]  Treated with cephalexin and doxycycline but serratia is resistant to cephalexin. []  Patient discharged originally without antimicrobial agent and treatment is now indicated  New antibiotic prescription: Stop doxycycline. Continue cephalexin. Prescribe bactrim DS 2 tablets twice daily x 7 days   ED Provider: Shelby Dubin, PA-C   Albertina Parr, PharmD., BCPS Clinical Pharmacist Clinical phone for 02/04/18 until 3:30pm: [phone] If after 3:30pm, please refer to [facility] for unit-specific pharmacist

## 2018-02-05 NOTE — Telephone Encounter (Signed)
Post ED Visit - Positive Culture Follow-up: Unsuccessful Patient Follow-up  Culture assessed and recommendations reviewed by:  []  Elenor Quinones, Pharm.D. []  Heide Guile, Pharm.D., BCPS AQ-ID []  Parks Neptune, Pharm.D., BCPS []  Alycia Rossetti, Pharm.D., BCPS []  [place], Pharm.D., BCPS, AAHIVP []  Legrand Como, Pharm.D., BCPS, AAHIVP []  Wynell Balloon, PharmD []  Vincenza Hews, PharmD, BCPS [facility] D Positive aerobic culture  []  Patient discharged without antimicrobial prescription and treatment is now indicated [x]  Organism is resistant to prescribed ED discharge antimicrobial []  Patient with positive blood cultures   Unable to contact patient after 3 attempts, letter will be sent to address on file  Genia Del 02/05/2018, 6:51 AM

## 2018-03-24 DIAGNOSIS — F259 Schizoaffective disorder, unspecified: Secondary | ICD-10-CM | POA: Diagnosis not present

## 2018-08-14 IMAGING — US US TRANSVAGINAL NON-OB
1 series · 13 of 25 positions shown · non-contrast
Comparison: Prior CT from 12/18/2013.

CLINICAL DATA: Initial evaluation for acute pelvic pain.

EXAM:
TRANSABDOMINAL AND TRANSVAGINAL ULTRASOUND OF PELVIS
DOPPLER ULTRASOUND OF OVARIES
TECHNIQUE: Both transabdominal and transvaginal ultrasound examinations of the
pelvis were performed. Transabdominal technique was performed for
global imaging of the pelvis including uterus, ovaries, adnexal
regions, and pelvic cul-de-sac.
It was necessary to proceed with endovaginal exam following the
transabdominal exam to visualize the uterus, endometrium, and
ovaries. Color and duplex Doppler ultrasound was utilized to
evaluate blood flow to the ovaries.

[Series 1: us transvaginal non-ob · 0.22mm/px · 59 acquisitions, 13 frames shown]
[im 1/59]
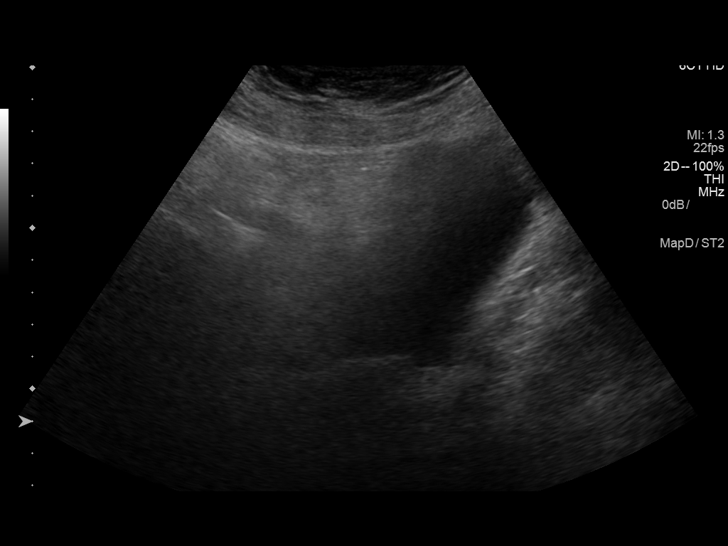
[im 5/59]
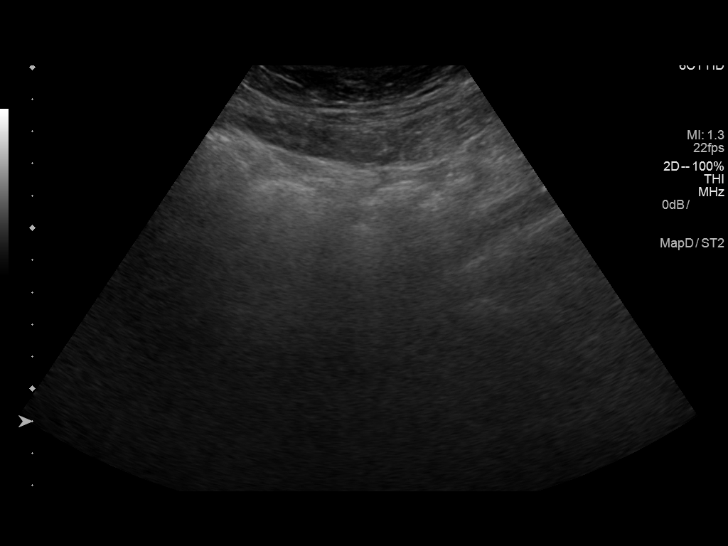
[im 10/59]
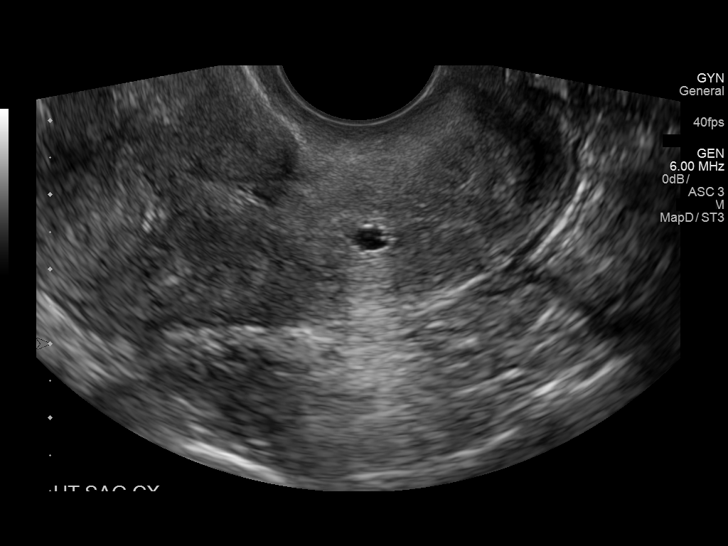
[im 15/59]
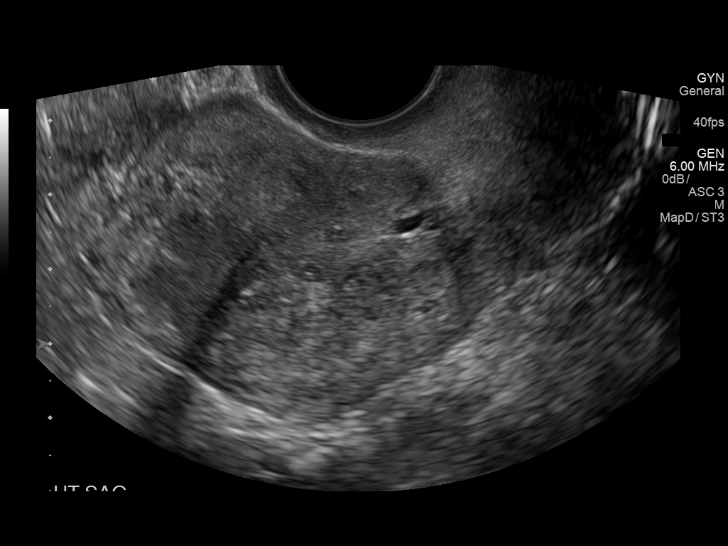
[im 20/59]
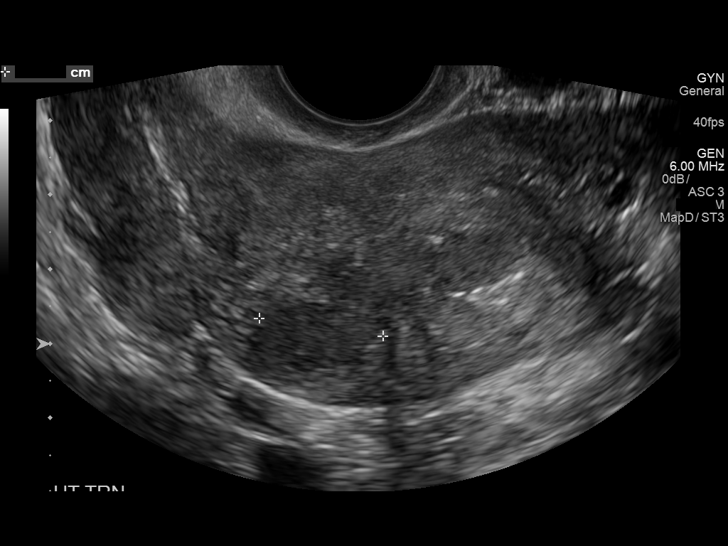
[im 25/59]
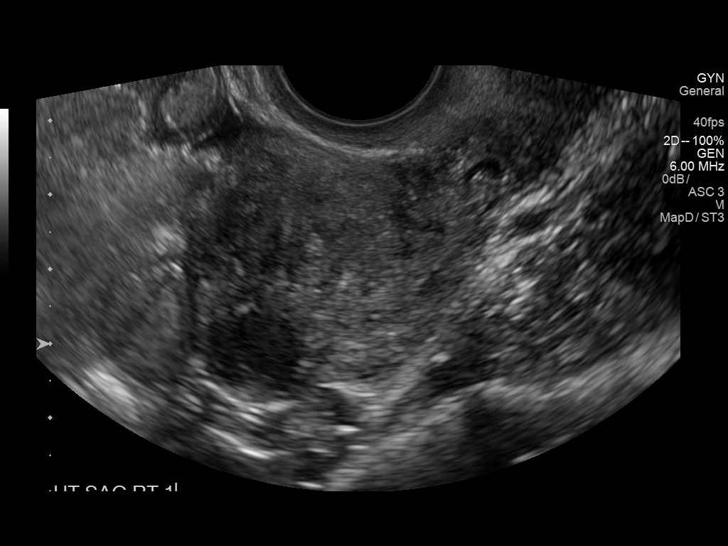
[im 30/59]
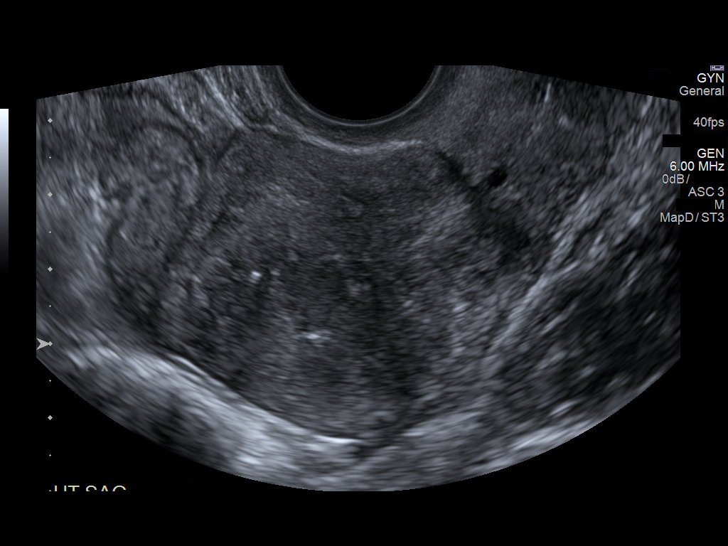
[im 34/59]
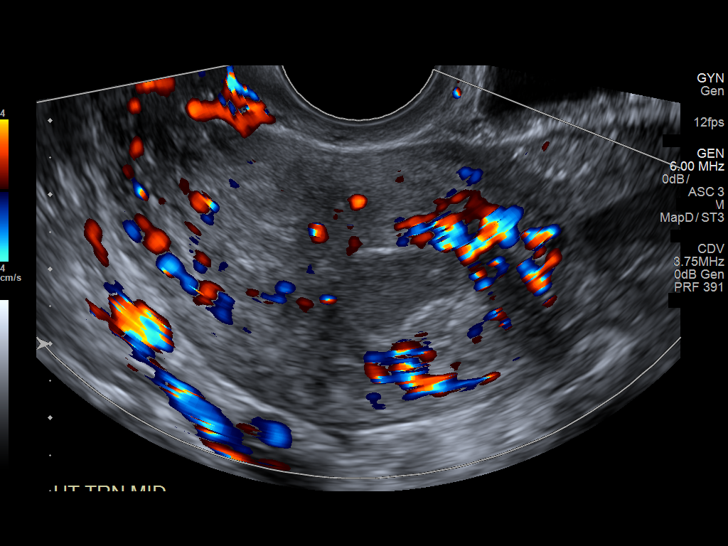
[im 39/59]
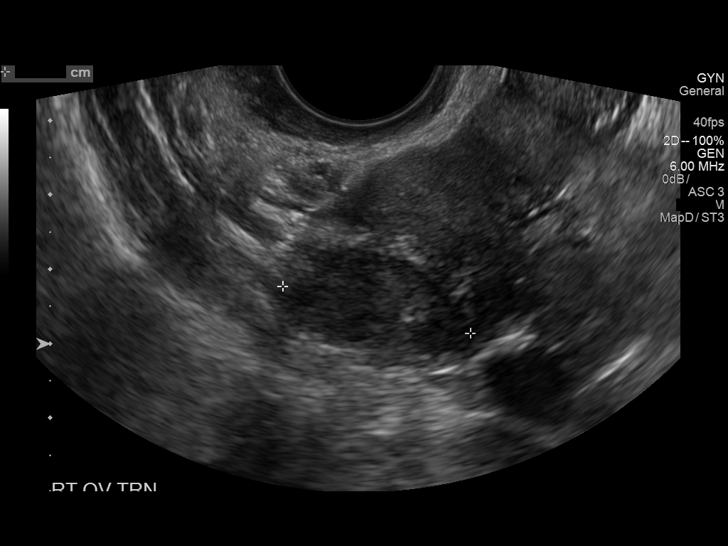
[im 44/59]
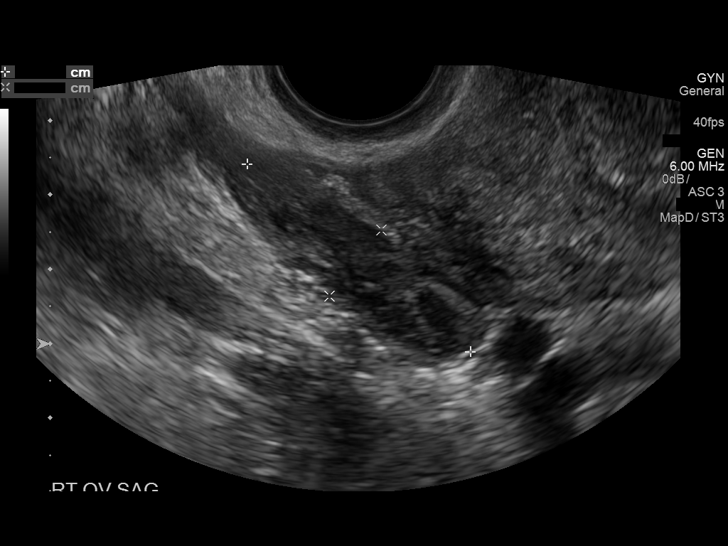
[im 49/59]
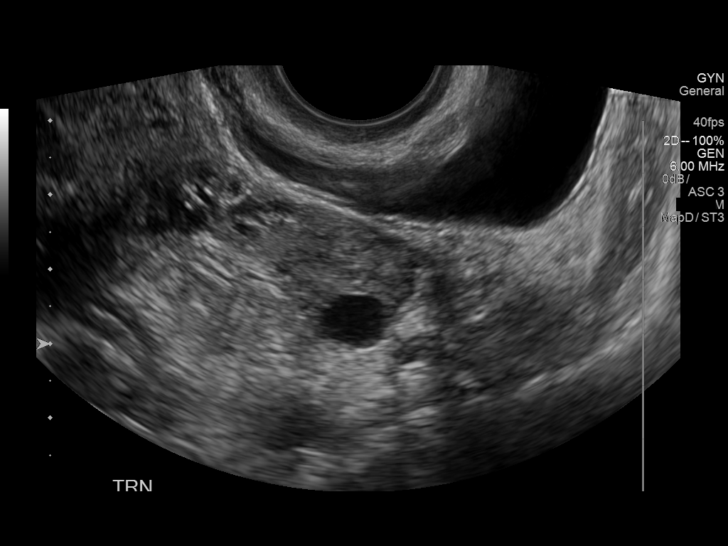
[im 54/59]
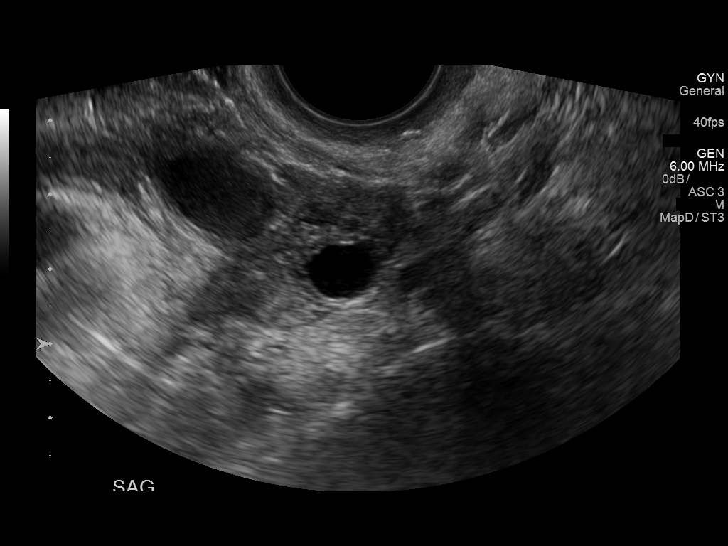
[im 59/59]
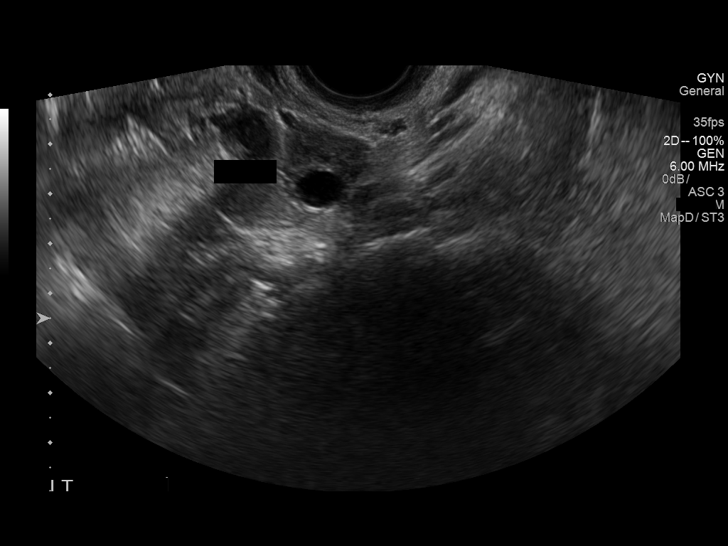

[13 of 25 positions shown; findings below may reference images not displayed]

FINDINGS: Uterus

Measurements: 7.7 x 3.8 x 6.2 cm. 1.3 x 1.2 x 1.4 cm fibroid present
at the posterior uterine fundus.

Endometrium

Complex hypoechoic area involving the endometrial stripe at the
level of the uterine fundus measures approximately 2.1 x 1.9 x
cm, which appears somewhat oblong in appearance on transverse
imaging. Finding favored to reflect hypoechoic/complex fluid within
the endometrial canal. No internal vascularity within this
structure, although there is diffusely increased vascularity within
the surrounding myometrium. Inferiorly, endometrial stripe measures
approximately 5 mm in thickness at the level the lower uterine
segment.

Right ovary

Measurements: 3.9 x 1.1 x 2.6 cm. Normal appearance/no adnexal mass.

Left ovary

Measurements: 3.4 x 1.9 x 2.2 cm. Normal appearance/no adnexal mass.

Pulsed Doppler evaluation of both ovaries demonstrates normal
low-resistance arterial and venous waveforms.

Other findings

No abnormal free fluid.
IMPRESSION: 1. Ill-defined 2.1 x 1.9 x 1.7 cm hypoechoic structure at the fundal
aspect of the endometrial complex, favored to reflect complex fluid
within the endometrial cavity. Increased vascularity within the
surrounding myometrium. Finding raises the possibility for an acute
infectious process. Correlation with symptomatology and laboratory
values recommended. Additionally, a short interval follow-up
ultrasound in 6-12 weeks to ensure these changes resolve is
recommended.
2. 1.4 cm intramural fibroid.
3. No other acute abnormality within the pelvis. No evidence for
torsion.

## 2018-10-03 DIAGNOSIS — R1030 Lower abdominal pain, unspecified: Secondary | ICD-10-CM | POA: Diagnosis not present

## 2018-10-03 DIAGNOSIS — R1084 Generalized abdominal pain: Secondary | ICD-10-CM | POA: Diagnosis not present

## 2018-10-10 ENCOUNTER — Other Ambulatory Visit: Payer: Self-pay

## 2018-10-10 ENCOUNTER — Emergency Department (HOSPITAL_COMMUNITY): Payer: Medicare Other

## 2018-10-10 ENCOUNTER — Encounter (HOSPITAL_COMMUNITY): Payer: Self-pay | Admitting: *Deleted

## 2018-10-10 ENCOUNTER — Emergency Department (HOSPITAL_COMMUNITY)
Admission: EM | Admit: 2018-10-10 | Discharge: 2018-10-10 | Disposition: A | Discharge status: Home / Self Care | Payer: Medicare Other | Attending: Emergency Medicine | Admitting: Emergency Medicine

## 2018-10-10 DIAGNOSIS — R1030 Lower abdominal pain, unspecified: Secondary | ICD-10-CM | POA: Diagnosis not present

## 2018-10-10 DIAGNOSIS — I10 Essential (primary) hypertension: Secondary | ICD-10-CM | POA: Diagnosis not present

## 2018-10-10 DIAGNOSIS — R001 Bradycardia, unspecified: Secondary | ICD-10-CM | POA: Diagnosis not present

## 2018-10-10 DIAGNOSIS — R109 Unspecified abdominal pain: Secondary | ICD-10-CM | POA: Diagnosis not present

## 2018-10-10 DIAGNOSIS — F17210 Nicotine dependence, cigarettes, uncomplicated: Secondary | ICD-10-CM | POA: Insufficient documentation

## 2018-10-10 DIAGNOSIS — R1084 Generalized abdominal pain: Secondary | ICD-10-CM | POA: Diagnosis not present

## 2018-10-10 DIAGNOSIS — J449 Chronic obstructive pulmonary disease, unspecified: Secondary | ICD-10-CM | POA: Insufficient documentation

## 2018-10-10 DIAGNOSIS — I959 Hypotension, unspecified: Secondary | ICD-10-CM | POA: Diagnosis not present

## 2018-10-10 LAB — URINALYSIS, ROUTINE W REFLEX MICROSCOPIC
Bacteria, UA: NONE SEEN
Bilirubin Urine: NEGATIVE
Glucose, UA: NEGATIVE mg/dL
Hgb urine dipstick: NEGATIVE
Ketones, ur: NEGATIVE mg/dL
Nitrite: NEGATIVE
Protein, ur: NEGATIVE mg/dL
Specific Gravity, Urine: 1.035 — ABNORMAL HIGH (ref 1.005–1.030)
pH: 5 (ref 5.0–8.0)

## 2018-10-10 LAB — COMPREHENSIVE METABOLIC PANEL
ALT: 60 U/L — ABNORMAL HIGH (ref 0–44)
AST: 44 U/L — ABNORMAL HIGH (ref 15–41)
Albumin: 3.7 g/dL (ref 3.5–5.0)
Alkaline Phosphatase: 46 U/L (ref 38–126)
Anion gap: 9 (ref 5–15)
BUN: 15 mg/dL (ref 6–20)
CO2: 22 mmol/L (ref 22–32)
Calcium: 8.7 mg/dL — ABNORMAL LOW (ref 8.9–10.3)
Chloride: 106 mmol/L (ref 98–111)
Creatinine, Ser: 0.68 mg/dL (ref 0.44–1.00)
GFR calc Af Amer: >60 mL/min (ref >60)
GFR calc non Af Amer: >60 mL/min (ref >60)
Glucose, Bld: 111 mg/dL — ABNORMAL HIGH (ref 70–99)
Potassium: 3.9 mmol/L (ref 3.5–5.1)
Sodium: 137 mmol/L (ref 135–145)
Total Bilirubin: 0.2 mg/dL — ABNORMAL LOW (ref 0.3–1.2)
Total Protein: 8 g/dL (ref 6.5–8.1)

## 2018-10-10 LAB — CBC
HCT: 40.4 % (ref 36.0–46.0)
Hemoglobin: 13.3 g/dL (ref 12.0–15.0)
MCH: 28.7 pg (ref 26.0–34.0)
MCHC: 32.9 g/dL (ref 30.0–36.0)
MCV: 87.1 fL (ref 80.0–100.0)
Platelets: 227 10*3/uL (ref 150–400)
RBC: 4.64 MIL/uL (ref 3.87–5.11)
RDW: 15.2 % (ref 11.5–15.5)
WBC: 10.1 10*3/uL (ref 4.0–10.5)
nRBC: 0 % (ref 0.0–0.2)

## 2018-10-10 LAB — LIPASE, BLOOD: Lipase: 35 U/L (ref 11–51)

## 2018-10-10 MED ORDER — KETOROLAC TROMETHAMINE 30 MG/ML IJ SOLN
30.0000 mg | Freq: Once | INTRAMUSCULAR | Status: AC
  Administered 2018-10-10: 30 mg via INTRAVENOUS
  Filled 2018-10-10: qty 1

## 2018-10-10 MED ORDER — IOHEXOL 300 MG/ML  SOLN
100.0000 mL | Freq: Once | INTRAMUSCULAR | Status: AC | PRN
  Administered 2018-10-10: 100 mL via INTRAVENOUS

## 2018-10-10 MED ORDER — ONDANSETRON HCL 4 MG/2ML IJ SOLN
4.0000 mg | Freq: Once | INTRAMUSCULAR | Status: AC
  Administered 2018-10-10: 4 mg via INTRAVENOUS
  Filled 2018-10-10: qty 2

## 2018-10-10 MED ORDER — DICYCLOMINE HCL 20 MG PO TABS: 20.0000 mg | ORAL_TABLET | Freq: Three times a day (TID) | ORAL | 0 refills | Status: DC | PRN

## 2018-10-10 MED ORDER — ONDANSETRON 4 MG PO TBDP: 4.0000 mg | ORAL_TABLET | Freq: Three times a day (TID) | ORAL | 0 refills | Status: DC | PRN

## 2018-10-10 NOTE — Discharge Instructions (Signed)

## 2018-10-10 NOTE — ED Provider Notes (Signed)
Emergency Department Provider Note   I have reviewed the triage vital signs and the nursing notes.   HISTORY  Chief Complaint Abdominal Pain   HPI [name] is a 48 y.o. female with PMH of Polysubstance abuse currently in recovery and on Suboxone, COPD, and Hep C presents to the emergency department for evaluation of lower abdominal pain worse on the left.  Patient states she has had a combination of sharp and dull pain in the lower abdomen for approximately 1 year.  Patient states that symptoms have worsened significantly over the past 2 months with now some associated nausea and vomiting.  Patient has attempted to obtain a primary care physician for evaluation but has been unsuccessful.  With symptoms worsening the patient presents to the emergency department.  She denies any unintentional weight loss.  She states that she has been clean and off drugs for the past year and has actually gained weight in that timeframe.  Denies any fevers.  No vaginal bleeding or discharge.  No UTI symptoms.  No diarrhea.  No blood in the vomit.  Pain is constant and nonradiating.  No other modifying factors.   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   Hepatitis C infection 07/13/2014   IV drug abuse ([place])    Nephrolithiasis    Panic attacks    Polysubstance abuse (HCC)    cocaine, opiates, marijuana, amphetamines, "molly," crystal meth   Pulmonary embolism ([place])    Septic arthritis of right sternoclavicular joint ([place]) 02/21/2016    Patient Active Problem List   Diagnosis Date Noted   Cellulitis 05/04/2017   Dental infection 01/06/2017   Abscess of multiple sites    Hepatitis C infection 07/13/2014   IVDA (intravenous drug abuse) complicating pregnancy ([place])    Opioid use disorder,  severe, dependence ([place]) 06/17/2014    Past Surgical History:  Procedure Laterality Date   APPLICATION OF A-CELL OF CHEST/ABDOMEN Right 09/07/2016   Procedure: APPLICATION OF A-CELL OF CHEST/ABDOMEN;  Surgeon: Wallace Going, DO;  Location: [place];  Service: Plastics;  Laterality: Right;   CESAREAN SECTION     I&D EXTREMITY Right 09/02/2016   Procedure: IRRIGATION AND DEBRIDEMENT OF RIGHT STERNOCLAVICULAR JOINT, STERNUM AND RIBS;  Surgeon: Gaye Pollack, MD;  Location: [place] OR;  Service: Vascular;  Laterality: Right;   INCISION AND DRAINAGE OF WOUND Right 09/07/2016   Procedure: IRRIGATION AND DEBRIDEMENT OF CHEST WOUND, PLACEMENT OF A CELL AND VAC;  Surgeon: Wallace Going, DO;  Location: [place];  Service: Plastics;  Laterality: Right;   NOSE SURGERY     STERNAL WOUND DEBRIDEMENT N/A 02/22/2016   Procedure: IRRIGATION AND DEBRIDEMENT MANUBRIUM ABSCESS with wound cultures;  Surgeon: Melrose Nakayama, MD;  Location: [facility];  Service: Thoracic;  Laterality: N/A;   STERNAL WOUND DEBRIDEMENT N/A 02/23/2016   Procedure: DRESSING CHANGE UNDER ANESTHESIA  WITH WOUND VAC PLACEMENT;  Surgeon: Melrose Nakayama, MD;  Location: [place];  Service: Thoracic;  Laterality: N/A;   TEE WITHOUT CARDIOVERSION N/A 02/23/2016   Procedure: TRANSESOPHAGEAL ECHOCARDIOGRAM (TEE);  Surgeon: Jerline Pain, MD;  Location[facility];  Service: Cardiovascular;  Laterality: N/A;   TUBAL LIGATION     "tied, burned, clipped"    Allergies Mango flavor and Pyridium [  phenazopyridine hcl]  Family History  Problem Relation Age of Onset   COPD Mother    Hypertension Father    Heart attack Father    Diabetes Father    Arthritis Other    Lung disease Other    Cancer Other    Asthma Other     Social History Social History   Tobacco Use   Smoking status: Current Every Day Smoker    Packs/day: 1.00    Years: 32.00    Pack years: 32.00    Types: Cigarettes   Smokeless tobacco:  Never Used  Substance Use Topics   Alcohol use: Not Currently    Alcohol/week: 0.0 standard drinks   Drug use: Not Currently    Types: IV, Cocaine, Marijuana, Methamphetamines, Heroin, Other-see comments    Comment: h/o Crystal meth, "molly," narcotics  clean for 13 months    Review of Systems  Constitutional: No fever/chills Eyes: No visual changes. ENT: No sore throat. Cardiovascular: Denies chest pain. Respiratory: Denies shortness of breath. Gastrointestinal: Positive lower abdominal pain. Positive nausea and vomiting.  No diarrhea.  No constipation. Genitourinary: Negative for dysuria. Musculoskeletal: Negative for back pain. Skin: Negative for rash. Neurological: Negative for headaches, focal weakness or numbness.  10-point ROS otherwise negative.  ____________________________________________   PHYSICAL EXAM:  VITAL SIGNS: ED Triage Vitals  Enc Vitals Group     BP 10/10/18 1414 (!) 144/65     Pulse Rate 10/10/18 1414 (!) 50     Resp 10/10/18 1412 16     Temp 10/10/18 1412 97.8 F (36.6 C)     Temp Source 10/10/18 1412 Oral     SpO2 10/10/18 1412 98 %     Weight 10/10/18 1408 230 lb (104.3 kg)     Height 10/10/18 1408 5\' 1"  (1.549 m)     Pain Score 10/10/18 1408 8   Constitutional: Alert and oriented. Well appearing and in no acute distress. Eyes: Conjunctivae are normal.  Head: Atraumatic. Nose: No congestion/rhinnorhea. Mouth/Throat: Mucous membranes are moist.  Neck: No stridor.  Cardiovascular: Bradycardia. Good peripheral circulation. Grossly normal heart sounds.   Respiratory: Normal respiratory effort.  No retractions. Lungs CTAB. Gastrointestinal: Soft with mild tenderness in the bilateral lower abdomen. No distention.  Musculoskeletal: No lower extremity tenderness nor edema. No gross deformities of extremities. Neurologic:  Normal speech and language.  Skin:  Skin is warm, dry and intact. No rash  noted.  ____________________________________________   LABS (all labs ordered are listed, but only abnormal results are displayed)  Labs Reviewed  COMPREHENSIVE METABOLIC PANEL - Abnormal; Notable for the following components:      Result Value   Glucose, Bld 111 (*)    Calcium 8.7 (*)    AST 44 (*)    ALT 60 (*)    Total Bilirubin 0.2 (*)    All other components within normal limits  URINALYSIS, ROUTINE W REFLEX MICROSCOPIC - Abnormal; Notable for the following components:   APPearance HAZY (*)    Specific Gravity, Urine 1.035 (*)    Leukocytes,Ua TRACE (*)    All other components within normal limits  LIPASE, BLOOD  CBC   ____________________________________________  RADIOLOGY  Ct Abdomen Pelvis W Contrast  Result Date: 10/10/2018 CLINICAL DATA:  Acute abdominal pain EXAM: CT ABDOMEN AND PELVIS WITH CONTRAST TECHNIQUE: Multidetector CT imaging of the abdomen and pelvis was performed using the standard protocol following bolus administration of intravenous contrast. CONTRAST:  165mL OMNIPAQUE IOHEXOL 300 MG/ML  SOLN COMPARISON:  CT  12/18/2013 FINDINGS: Lower chest: Lung bases demonstrate no acute consolidation or effusion. The heart size is normal. Hepatobiliary: No focal liver abnormality is seen. No gallstones, gallbladder wall thickening, or biliary dilatation. Pancreas: Unremarkable. No pancreatic ductal dilatation or surrounding inflammatory changes. Spleen: Normal in size without focal abnormality. Adrenals/Urinary Tract: Adrenal glands are unremarkable. Kidneys are normal, without renal calculi, focal lesion, or hydronephrosis. Bladder is unremarkable. Stomach/Bowel: Stomach is within normal limits. Appendix appears normal. No evidence of bowel wall thickening, distention, or inflammatory changes. Few sigmoid colon diverticula. Collapsed appearance versus minimal wall thickening of the sigmoid colon but no significant inflammatory change in the surrounding fat.  Vascular/Lymphatic: Nonaneurysmal aorta. Mild aortic atherosclerosis. Subcentimeter retroperitoneal lymph nodes. Reproductive: No adnexal mass. Possible uterine fundal fibroids. Cyst in the cervix. Prominent endometrial stripe measuring up to 21 mm in thickness. Other: Negative for free air or free fluid. Small fat in the umbilical region. Musculoskeletal: Degenerative changes at L5-S1. No acute abnormality. IMPRESSION: 1. Collapsed appearance versus mild wall thickening/colitis of the sigmoid colon, although not much inflammatory change in the surrounding fat. 2. Otherwise no CT evidence for acute intra-abdominal or pelvic abnormality. 3. Thickening of the endometrial complex up to 21 mm. Consider correlation with nonemergent pelvic ultrasound. Electronically Signed   By: Donavan Foil M.D.   On: 10/10/2018 20:38    ____________________________________________   PROCEDURES  Procedure(s) performed:   Procedures  None  ____________________________________________   INITIAL IMPRESSION / ASSESSMENT AND PLAN / ED COURSE  Pertinent labs & imaging results that were available during my care of the patient were reviewed by me and considered in my medical decision making (see chart for details).   Patient presents to the emergency department for evaluation of lower abdominal pain.  She states symptoms have been present for approximately 1 year but worsening over the past 2 to 3 months.  Some associated nausea and vomiting.  I see the patient was seen in the emergency department approximately 1 year ago and had a pelvic ultrasound at that time which showed complex fluid collection in the endometrial cavity with some increased vascularity in the myometrium.  Question no acute, infection process at that time but recommended short interval follow-up in 6 to 12 weeks which apparently did not happen.  Plan for CT imaging for reassessment at this time as my suspicion for ovarian torsion is low and not emergency  ultrasound is limited at this hour.   CT abdomen/pelvis reviewed. No acute findings. Myometrium thickening noted again. Discussed with patient and recommended OB follow up and f/u pelvic US. Patient verbalizes understanding. Question early colitis on CT with favor small bowel decompression. No focal tenderness in this area. No diarrhea. No fever. Advised calling GI in the AM to schedule a follow up and be seen for possible lower endoscopy. Plan for bentyl and zofran at home. Discussed ED return precautions.  ____________________________________________  FINAL CLINICAL IMPRESSION(S) / ED DIAGNOSES  Final diagnoses:  Generalized abdominal pain     MEDICATIONS GIVEN DURING THIS VISIT:  Medications  iohexol (OMNIPAQUE) 300 MG/ML solution 100 mL (100 mLs Intravenous Contrast Given 10/10/18 2008)  ketorolac (TORADOL) 30 MG/ML injection 30 mg (30 mg Intravenous Given 10/10/18 2048)  ondansetron (ZOFRAN) injection 4 mg (4 mg Intravenous Given 10/10/18 2048)     NEW OUTPATIENT MEDICATIONS STARTED DURING THIS VISIT:  Discharge Medication List as of 10/10/2018  9:17 PM    START taking these medications   Details  dicyclomine (BENTYL) 20 MG tablet Take 1 tablet (  20 mg total) by mouth 3 (three) times daily as needed for spasms (abdominal cramping)., Starting Tue 10/10/2018, Print    ondansetron (ZOFRAN ODT) 4 MG disintegrating tablet Take 1 tablet (4 mg total) by mouth every 8 (eight) hours as needed for nausea or vomiting., Starting Tue 10/10/2018, Print        Note:  This document was prepared using Dragon voice recognition software and may include unintentional dictation errors.  Nanda Quinton, MD Emergency Medicine    Yeudiel Mateo, [facility], MD 10/11/18 [phone]

## 2018-10-10 NOTE — ED Triage Notes (Signed)
Pt brought in by [facility] with c/o lower abdominal pain, mainly on left side along with n/v x 2 months. Pt reports she was seen at Urgent Care last week for same but nothing abnormal was found. Denies diarrhea.

## 2018-10-11 DIAGNOSIS — J45909 Unspecified asthma, uncomplicated: Secondary | ICD-10-CM | POA: Diagnosis not present

## 2018-10-11 DIAGNOSIS — I10 Essential (primary) hypertension: Secondary | ICD-10-CM | POA: Diagnosis not present

## 2018-10-11 DIAGNOSIS — R69 Illness, unspecified: Secondary | ICD-10-CM | POA: Diagnosis not present

## 2018-10-11 DIAGNOSIS — F1120 Opioid dependence, uncomplicated: Secondary | ICD-10-CM | POA: Diagnosis not present

## 2018-10-11 DIAGNOSIS — Z599 Problem related to housing and economic circumstances, unspecified: Secondary | ICD-10-CM | POA: Diagnosis not present

## 2018-10-18 ENCOUNTER — Encounter: Payer: Self-pay | Admitting: Internal Medicine

## 2018-10-18 ENCOUNTER — Telehealth: Payer: Self-pay | Admitting: *Deleted

## 2018-10-18 DIAGNOSIS — R69 Illness, unspecified: Secondary | ICD-10-CM | POA: Diagnosis not present

## 2018-10-18 DIAGNOSIS — J45909 Unspecified asthma, uncomplicated: Secondary | ICD-10-CM | POA: Diagnosis not present

## 2018-10-18 DIAGNOSIS — I10 Essential (primary) hypertension: Secondary | ICD-10-CM | POA: Diagnosis not present

## 2018-10-18 DIAGNOSIS — F1120 Opioid dependence, uncomplicated: Secondary | ICD-10-CM | POA: Diagnosis not present

## 2018-10-18 DIAGNOSIS — Z599 Problem related to housing and economic circumstances, unspecified: Secondary | ICD-10-CM | POA: Diagnosis not present

## 2018-10-18 NOTE — Telephone Encounter (Signed)
Patient called stating she was seen in the ED last week and they found a large mass in her pelvic area and she was told to call our office. Please advise

## 2018-10-20 NOTE — Telephone Encounter (Signed)
Her endometrium was a little thickened but that can be normal based on the timing of her cycle,  I would recommend doing the sonogram after 2 menstrual cycles,  just after she stops bleeding with the second one

## 2018-10-25 DIAGNOSIS — R69 Illness, unspecified: Secondary | ICD-10-CM | POA: Diagnosis not present

## 2018-10-25 DIAGNOSIS — F1120 Opioid dependence, uncomplicated: Secondary | ICD-10-CM | POA: Diagnosis not present

## 2018-10-25 DIAGNOSIS — I10 Essential (primary) hypertension: Secondary | ICD-10-CM | POA: Diagnosis not present

## 2018-10-25 DIAGNOSIS — Z599 Problem related to housing and economic circumstances, unspecified: Secondary | ICD-10-CM | POA: Diagnosis not present

## 2018-10-25 DIAGNOSIS — J45909 Unspecified asthma, uncomplicated: Secondary | ICD-10-CM | POA: Diagnosis not present

## 2018-10-27 NOTE — Telephone Encounter (Signed)
Have not heard back from patient. If she calls back Dr. Elonda Husky recommends an ultrasound after she has had 2 periods since the hospital visit.

## 2018-11-01 DIAGNOSIS — M1712 Unilateral primary osteoarthritis, left knee: Secondary | ICD-10-CM | POA: Diagnosis not present

## 2018-11-01 DIAGNOSIS — R69 Illness, unspecified: Secondary | ICD-10-CM | POA: Diagnosis not present

## 2018-11-01 DIAGNOSIS — I10 Essential (primary) hypertension: Secondary | ICD-10-CM | POA: Diagnosis not present

## 2018-11-01 DIAGNOSIS — M545 Low back pain: Secondary | ICD-10-CM | POA: Diagnosis not present

## 2018-11-01 DIAGNOSIS — M1711 Unilateral primary osteoarthritis, right knee: Secondary | ICD-10-CM | POA: Diagnosis not present

## 2018-11-01 DIAGNOSIS — Z599 Problem related to housing and economic circumstances, unspecified: Secondary | ICD-10-CM | POA: Diagnosis not present

## 2018-11-01 DIAGNOSIS — J45909 Unspecified asthma, uncomplicated: Secondary | ICD-10-CM | POA: Diagnosis not present

## 2018-11-01 DIAGNOSIS — F1120 Opioid dependence, uncomplicated: Secondary | ICD-10-CM | POA: Diagnosis not present

## 2018-11-05 ENCOUNTER — Other Ambulatory Visit: Payer: Self-pay

## 2018-11-05 ENCOUNTER — Encounter (HOSPITAL_COMMUNITY): Payer: Self-pay | Admitting: Emergency Medicine

## 2018-11-05 DIAGNOSIS — I959 Hypotension, unspecified: Secondary | ICD-10-CM | POA: Diagnosis not present

## 2018-11-05 DIAGNOSIS — R1032 Left lower quadrant pain: Secondary | ICD-10-CM | POA: Diagnosis not present

## 2018-11-05 DIAGNOSIS — F17210 Nicotine dependence, cigarettes, uncomplicated: Secondary | ICD-10-CM | POA: Insufficient documentation

## 2018-11-05 DIAGNOSIS — J449 Chronic obstructive pulmonary disease, unspecified: Secondary | ICD-10-CM | POA: Diagnosis not present

## 2018-11-05 DIAGNOSIS — R102 Pelvic and perineal pain: Secondary | ICD-10-CM | POA: Insufficient documentation

## 2018-11-05 DIAGNOSIS — Z79899 Other long term (current) drug therapy: Secondary | ICD-10-CM | POA: Diagnosis not present

## 2018-11-05 DIAGNOSIS — R1030 Lower abdominal pain, unspecified: Secondary | ICD-10-CM | POA: Diagnosis not present

## 2018-11-05 DIAGNOSIS — R1084 Generalized abdominal pain: Secondary | ICD-10-CM | POA: Diagnosis not present

## 2018-11-05 DIAGNOSIS — R69 Illness, unspecified: Secondary | ICD-10-CM | POA: Diagnosis not present

## 2018-11-05 DIAGNOSIS — R109 Unspecified abdominal pain: Secondary | ICD-10-CM | POA: Diagnosis not present

## 2018-11-05 NOTE — ED Triage Notes (Signed)
Pt reports she was seen here for same x 2-3 weeks ago, reports mid abd pain that now has started radiating around her left side to back

## 2018-11-06 ENCOUNTER — Emergency Department (HOSPITAL_COMMUNITY)
Admission: EM | Admit: 2018-11-06 | Discharge: 2018-11-06 | Disposition: A | Discharge status: Home / Self Care | Payer: Medicare HMO | Attending: Emergency Medicine | Admitting: Emergency Medicine

## 2018-11-06 ENCOUNTER — Emergency Department (HOSPITAL_COMMUNITY): Payer: Medicare HMO

## 2018-11-06 ENCOUNTER — Ambulatory Visit (HOSPITAL_COMMUNITY): Admission: RE | Admit: 2018-11-06 | Payer: Medicare HMO | Source: Ambulatory Visit

## 2018-11-06 DIAGNOSIS — R102 Pelvic and perineal pain: Secondary | ICD-10-CM

## 2018-11-06 DIAGNOSIS — R109 Unspecified abdominal pain: Secondary | ICD-10-CM

## 2018-11-06 LAB — RAPID URINE DRUG SCREEN, HOSP PERFORMED
Amphetamines: NOT DETECTED
Barbiturates: NOT DETECTED
Benzodiazepines: NOT DETECTED
Cocaine: NOT DETECTED
Opiates: NOT DETECTED
Tetrahydrocannabinol: NOT DETECTED

## 2018-11-06 LAB — CBC WITH DIFFERENTIAL/PLATELET
Abs Immature Granulocytes: 0.02 10*3/uL (ref 0.00–0.07)
Basophils Absolute: 0.1 10*3/uL (ref 0.0–0.1)
Basophils Relative: 1 %
Eosinophils Absolute: 0.2 10*3/uL (ref 0.0–0.5)
Eosinophils Relative: 2 %
HCT: 46.2 % — ABNORMAL HIGH (ref 36.0–46.0)
Hemoglobin: 15 g/dL (ref 12.0–15.0)
Immature Granulocytes: 0 %
Lymphocytes Relative: 37 %
Lymphs Abs: 3.6 10*3/uL (ref 0.7–4.0)
MCH: 28.9 pg (ref 26.0–34.0)
MCHC: 32.5 g/dL (ref 30.0–36.0)
MCV: 89 fL (ref 80.0–100.0)
Monocytes Absolute: 0.8 10*3/uL (ref 0.1–1.0)
Monocytes Relative: 8 %
Neutro Abs: 5.2 10*3/uL (ref 1.7–7.7)
Neutrophils Relative %: 52 %
Platelets: 306 10*3/uL (ref 150–400)
RBC: 5.19 MIL/uL — ABNORMAL HIGH (ref 3.87–5.11)
RDW: 15.1 % (ref 11.5–15.5)
WBC: 9.8 10*3/uL (ref 4.0–10.5)
nRBC: 0 % (ref 0.0–0.2)

## 2018-11-06 LAB — URINALYSIS, ROUTINE W REFLEX MICROSCOPIC
Bilirubin Urine: NEGATIVE
Glucose, UA: NEGATIVE mg/dL
Hgb urine dipstick: NEGATIVE
Ketones, ur: NEGATIVE mg/dL
Nitrite: NEGATIVE
Protein, ur: NEGATIVE mg/dL
Specific Gravity, Urine: 1.024 (ref 1.005–1.030)
pH: 5 (ref 5.0–8.0)

## 2018-11-06 LAB — COMPREHENSIVE METABOLIC PANEL
ALT: 50 U/L — ABNORMAL HIGH (ref 0–44)
AST: 42 U/L — ABNORMAL HIGH (ref 15–41)
Albumin: 4.1 g/dL (ref 3.5–5.0)
Alkaline Phosphatase: 49 U/L (ref 38–126)
Anion gap: 7 (ref 5–15)
BUN: 14 mg/dL (ref 6–20)
CO2: 25 mmol/L (ref 22–32)
Calcium: 8.9 mg/dL (ref 8.9–10.3)
Chloride: 103 mmol/L (ref 98–111)
Creatinine, Ser: 0.66 mg/dL (ref 0.44–1.00)
GFR calc Af Amer: >60 mL/min (ref >60)
GFR calc non Af Amer: >60 mL/min (ref >60)
Glucose, Bld: 115 mg/dL — ABNORMAL HIGH (ref 70–99)
Potassium: 4.1 mmol/L (ref 3.5–5.1)
Sodium: 135 mmol/L (ref 135–145)
Total Bilirubin: 0.7 mg/dL (ref 0.3–1.2)
Total Protein: 9 g/dL — ABNORMAL HIGH (ref 6.5–8.1)

## 2018-11-06 LAB — ETHANOL: Alcohol, Ethyl (B): <10 mg/dL (ref <10)

## 2018-11-06 LAB — LIPASE, BLOOD: Lipase: 29 U/L (ref 11–51)

## 2018-11-06 LAB — PREGNANCY, URINE: Preg Test, Ur: NEGATIVE

## 2018-11-06 MED ORDER — ACETAMINOPHEN 325 MG PO TABS
650.0000 mg | ORAL_TABLET | Freq: Once | ORAL | Status: AC
  Administered 2018-11-06: 650 mg via ORAL
  Filled 2018-11-06: qty 2

## 2018-11-06 MED ORDER — KETOROLAC TROMETHAMINE 60 MG/2ML IM SOLN
60.0000 mg | Freq: Once | INTRAMUSCULAR | Status: AC
  Administered 2018-11-06: 60 mg via INTRAMUSCULAR
  Filled 2018-11-06: qty 2

## 2018-11-06 NOTE — ED Provider Notes (Signed)
[facility] EMERGENCY DEPARTMENT Provider Note   CSN: [id number] Arrival date & time: 11/05/18  2123  Time seen 01:35 AM  History   Chief Complaint Chief Complaint  Patient presents with  . Abdominal Pain    HPI [name] is a 48 y.o. female.     HPI patient states she is here for abdominal pain she has had for "a while".  She states that is about a year.  She states it starts in her lower pelvis below her C-section scar.  However the last 2 days it has radiated into her left flank area.  She states that sharp and constant.  Nothing she does makes it hurt more, but laying down and rubbing her lower abdomen makes it feel better.  She denies any nausea or vomiting the past 2 days.  She denies vaginal discharge, fever, dysuria, frequency, or hematuria.  She states the lower abdominal pain was evaluated in our ED on June 30 and she was told she had masses on her colon and her pelvic area and was referred to GI and OB/GYN.  She states she has an appointment on August 7.  She has been unable to get a primary care doctor.  PCP Patient, [name]   Past Medical History:  Diagnosis Date  . Anxiety   . Chronic back pain   . Chronic knee pain   . COPD (chronic obstructive pulmonary disease) ([place])   . DDD (degenerative disc disease)   . 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 Hepatitis C infection 07/13/2014  . IV drug abuse ([place])   . Nephrolithiasis   . Panic attacks   . Polysubstance abuse ([facility])    cocaine, opiates, marijuana, amphetamines, "molly," crystal meth  . Pulmonary embolism ([place])   . Septic arthritis of right sternoclavicular joint ([place]) 02/21/2016    Patient Active Problem List   Diagnosis Date Noted  . Cellulitis 05/04/2017  . Dental infection 01/06/2017  . Abscess of multiple sites   . Hepatitis C infection 07/13/2014  . IVDA (intravenous drug abuse) complicating pregnancy ([place])   . Opioid use disorder, severe, dependence ([place]) 06/17/2014    Past  Surgical History:  Procedure Laterality Date  . APPLICATION OF A-CELL OF CHEST/ABDOMEN Right 09/07/2016   Procedure: APPLICATION OF A-CELL OF CHEST/ABDOMEN;  Surgeon: Wallace Going, DO;  Location: [place];  Service: Plastics;  Laterality: Right;  . CESAREAN SECTION    . I&D EXTREMITY Right 09/02/2016   Procedure: IRRIGATION AND DEBRIDEMENT OF RIGHT STERNOCLAVICULAR JOINT, STERNUM AND RIBS;  Surgeon: Gaye Pollack, MD;  Location: [place] [facility];  Service: Vascular;  Laterality: Right;  . INCISION AND DRAINAGE OF WOUND Right 09/07/2016   Procedure: IRRIGATION AND DEBRIDEMENT OF CHEST WOUND, PLACEMENT OF A CELL AND VAC;  Surgeon: Wallace Going, DO;  Location: [place];  Service: Plastics;  Laterality: Right;  . NOSE SURGERY    . STERNAL WOUND DEBRIDEMENT N/A 02/22/2016   Procedure: IRRIGATION AND DEBRIDEMENT MANUBRIUM ABSCESS with wound cultures;  Surgeon: Melrose Nakayama, MD;  Location: [place];  Service: Thoracic;  Laterality: N/A;  . STERNAL WOUND DEBRIDEMENT N/A 02/23/2016   Procedure: DRESSING CHANGE UNDER ANESTHESIA  WITH WOUND VAC PLACEMENT;  Surgeon: Melrose Nakayama, MD;  Location: [place];  Service: Thoracic;  Laterality: N/A;  . TEE WITHOUT CARDIOVERSION N/A 02/23/2016   Procedure: TRANSESOPHAGEAL ECHOCARDIOGRAM (TEE);  Surgeon: Jerline Pain, MD;  Location: [facility];  Service: Cardiovascular;  Laterality: N/A;  . TUBAL  LIGATION     "tied, burned, clipped"     OB History    Gravida  2   Para  2   Term  2   Preterm      AB      Living  2     SAB      TAB      Ectopic      Multiple      Live Births               Home Medications    Prior to Admission medications   Medication Sig Start Date End Date Taking? Authorizing Provider  acetaminophen (TYLENOL) 500 MG tablet Take 1,000 mg by mouth every 6 (six) hours as needed for mild pain or moderate pain.    [provider]  Buprenorphine HCl-Naloxone HCl (SUBOXONE) 8-2 MG FILM Place 1 Film under  the tongue 2 (two) times a day.    [provider]  dicyclomine (BENTYL) 20 MG tablet Take 1 tablet (20 mg total) by mouth 3 (three) times daily as needed for spasms (abdominal cramping). 10/10/18   Long, Wonda Olds, MD  ondansetron (ZOFRAN ODT) 4 MG disintegrating tablet Take 1 tablet (4 mg total) by mouth every 8 (eight) hours as needed for nausea or vomiting. 10/10/18   Long, Wonda Olds, MD    Family History Family History  Problem Relation Age of Onset  . COPD Mother   . Hypertension Father   . Heart attack Father   . Diabetes Father   . Arthritis Other   . Lung disease Other   . Cancer Other   . Asthma Other     Social History Social History   Tobacco Use  . Smoking status: Current Every Day Smoker    Packs/day: 1.00    Years: 32.00    Pack years: 32.00    Types: Cigarettes  . Smokeless tobacco: Never Used  Substance Use Topics  . Alcohol use: Not Currently    Alcohol/week: 0.0 standard drinks  . Drug use: Not Currently    Types: IV, Cocaine, Marijuana, Methamphetamines, Heroin, Other-see comments    Comment: h/o Crystal meth, "molly," narcotics  clean for 13 months  On disability for low back pain and bipolar   Allergies   Mango flavor and Pyridium [phenazopyridine hcl]   Review of Systems Review of Systems  All other systems reviewed and are negative.    Physical Exam Updated Vital Signs BP (!) 125/97 (BP Location: Right Wrist)   Pulse 84   Temp 97.8 F (36.6 C) (Oral)   Resp 17   Ht 5\' 1"  (1.549 m)   Wt 104.3 kg   LMP 07/01/2016   SpO2 96%   BMI 43.46 kg/m   Vital signs normal    Physical Exam Vitals signs and nursing note reviewed.  Constitutional:      Appearance: She is well-developed. She is obese.  HENT:     Head: Normocephalic and atraumatic.     Right Ear: External ear normal.     Left Ear: External ear normal.     Nose: Nose normal.     Mouth/Throat:     Mouth: Mucous membranes are moist.  Eyes:     Extraocular Movements:  Extraocular movements intact.     Conjunctiva/sclera: Conjunctivae normal.     Pupils: Pupils are equal, round, and reactive to light.  Neck:     Musculoskeletal: Normal range of motion.  Cardiovascular:     Rate  and Rhythm: Normal rate and regular rhythm.     Heart sounds: Normal heart sounds.  Pulmonary:     Effort: Pulmonary effort is normal. No respiratory distress.     Breath sounds: Normal breath sounds.  Abdominal:     General: Abdomen is flat. Bowel sounds are normal.     Palpations: Abdomen is soft.     Tenderness: There is no abdominal tenderness.     Comments: No CVA tenderness on either side, she is nontender over her lumbar spine or the lower rib cage.  Musculoskeletal: Normal range of motion.        General: No deformity.  Skin:    General: Skin is warm and dry.     Capillary Refill: Capillary refill takes less than 2 seconds.     Comments: Patient has a large defect in her right upper chest where she states she had a bone infection that was removed when she was doing IV drugs.  Neurological:     General: No focal deficit present.     Mental Status: She is alert and oriented to person, place, and time.     Cranial Nerves: No cranial nerve deficit.  Psychiatric:        Mood and Affect: Mood normal.        Behavior: Behavior normal.        Thought Content: Thought content normal.      ED Treatments / Results  Labs (all labs ordered are listed, but only abnormal results are displayed) Results for orders placed or performed during the hospital encounter of 11/06/18  Comprehensive metabolic panel  Result Value Ref Range   Sodium 135 135 - 145 mmol/L   Potassium 4.1 3.5 - 5.1 mmol/L   Chloride 103 98 - 111 mmol/L   CO2 25 22 - 32 mmol/L   Glucose, Bld 115 (H) 70 - 99 mg/dL   BUN 14 6 - 20 mg/dL   Creatinine, Ser 0.66 0.44 - 1.00 mg/dL   Calcium 8.9 8.9 - 10.3 mg/dL   Total Protein 9.0 (H) 6.5 - 8.1 g/dL   Albumin 4.1 3.5 - 5.0 g/dL   AST 42 (H) 15 - 41 U/L    ALT 50 (H) 0 - 44 U/L   Alkaline Phosphatase 49 38 - 126 U/L   Total Bilirubin 0.7 0.3 - 1.2 mg/dL   GFR calc non Af Amer >60 >60 mL/min   GFR calc Af Amer >60 >60 mL/min   Anion gap 7 5 - 15  Lipase, blood  Result Value Ref Range   Lipase 29 11 - 51 U/L  Ethanol  Result Value Ref Range   Alcohol, Ethyl (B) <10 <10 mg/dL  CBC with Differential  Result Value Ref Range   WBC 9.8 4.0 - 10.5 K/uL   RBC 5.19 (H) 3.87 - 5.11 MIL/uL   Hemoglobin 15.0 12.0 - 15.0 g/dL   HCT 46.2 (H) 36.0 - 46.0 %   MCV 89.0 80.0 - 100.0 fL   MCH 28.9 26.0 - 34.0 pg   MCHC 32.5 30.0 - 36.0 g/dL   RDW 15.1 11.5 - 15.5 %   Platelets 306 150 - 400 K/uL   nRBC 0.0 0.0 - 0.2 %   Neutrophils Relative % 52 %   Neutro Abs 5.2 1.7 - 7.7 K/uL   Lymphocytes Relative 37 %   Lymphs Abs 3.6 0.7 - 4.0 K/uL   Monocytes Relative 8 %   Monocytes Absolute 0.8 0.1 - 1.0 K/uL  Eosinophils Relative 2 %   Eosinophils Absolute 0.2 0.0 - 0.5 K/uL   Basophils Relative 1 %   Basophils Absolute 0.1 0.0 - 0.1 K/uL   Immature Granulocytes 0 %   Abs Immature Granulocytes 0.02 0.00 - 0.07 K/uL  Urinalysis, Routine w reflex microscopic  Result Value Ref Range   Color, Urine AMBER (A) YELLOW   APPearance CLOUDY (A) CLEAR   Specific Gravity, Urine 1.024 1.005 - 1.030   pH 5.0 5.0 - 8.0   Glucose, UA NEGATIVE NEGATIVE mg/dL   Hgb urine dipstick NEGATIVE NEGATIVE   Bilirubin Urine NEGATIVE NEGATIVE   Ketones, ur NEGATIVE NEGATIVE mg/dL   Protein, ur NEGATIVE NEGATIVE mg/dL   Nitrite NEGATIVE NEGATIVE   Leukocytes,Ua TRACE (A) NEGATIVE   RBC / HPF 11-20 0 - 5 RBC/hpf   WBC, UA 21-50 0 - 5 WBC/hpf   Bacteria, UA RARE (A) NONE SEEN   Squamous Epithelial / LPF 21-50 0 - 5   Mucus PRESENT   Pregnancy, urine  Result Value Ref Range   Preg Test, Ur NEGATIVE NEGATIVE  Urine rapid drug screen (hosp performed)  Result Value Ref Range   Opiates NONE DETECTED NONE DETECTED   Cocaine NONE DETECTED NONE DETECTED   Benzodiazepines  NONE DETECTED NONE DETECTED   Amphetamines NONE DETECTED NONE DETECTED   Tetrahydrocannabinol NONE DETECTED NONE DETECTED   Barbiturates NONE DETECTED NONE DETECTED   Laboratory interpretation all normal except minor elevation of LFTs      EKG None  Radiology Ct Renal Stone Study  Result Date: 11/06/2018 CLINICAL DATA:  Left-sided abdominal pain radiating to back EXAM: CT ABDOMEN AND PELVIS WITHOUT CONTRAST TECHNIQUE: Multidetector CT imaging of the abdomen and pelvis was performed following the standard protocol without IV contrast. COMPARISON:  10/10/2018 FINDINGS: Lower chest: Lung bases are clear. Hepatobiliary: Unenhanced liver is unremarkable. Gallbladder is unremarkable. No intrahepatic or extrahepatic ductal dilatation. Pancreas: Within normal limits. Spleen: Within normal limits. Adrenals/Urinary Tract: Adrenal glands are within normal limits. Kidneys are within normal limits. No renal, ureteral, or bladder calculi. No hydronephrosis. Bladder is underdistended but unremarkable. Stomach/Bowel: Stomach is within normal limits. No evidence of bowel obstruction. Normal appendix (series 3/image 58). No colonic wall thickening or inflammatory changes. Vascular/Lymphatic: No evidence of abdominal aortic aneurysm. Mild atherosclerotic calcifications the infrarenal abdominal aorta. No suspicious abdominopelvic lymphadenopathy. Reproductive: Mildly heterogeneous appearance of the left uterine fundus, poorly evaluated on unenhanced CT. Bilateral ovaries are within normal limits. Other: No abdominopelvic ascites. Musculoskeletal: Mild degenerative changes of the lower thoracic/upper lumbar spine. IMPRESSION: No renal, ureteral, or bladder calculi.  No hydronephrosis. No evidence of bowel obstruction.  Normal appendix. Heterogeneous appearance of the left uterine fundus, poorly evaluated on unenhanced CT. Degenerating uterine fibroid is a consideration. Consider pelvic ultrasound for further evaluation,  if clinically warranted. Electronically Signed   By: Julian Hy M.D.   On: 11/06/2018 02:47     Ct Abdomen Pelvis W Contrast  Result Date: 10/10/2018  CLINICAL DATA:  Acute abdominal pain y. IMPRESSION: 1. Collapsed appearance versus mild wall thickening/colitis of the sigmoid colon, although not much inflammatory change in the surrounding fat. 2. Otherwise no CT evidence for acute intra-abdominal or pelvic abnormality. 3. Thickening of the endometrial complex up to 21 mm. Consider correlation with nonemergent pelvic ultrasound. Electronically Signed   By: Donavan Foil M.D.   On: 10/10/2018 20:38    Procedures Procedures (including critical care time)  Medications Ordered in ED Medications  ketorolac (TORADOL) injection 60 mg (  60 mg Intramuscular Given 11/06/18 0204)  acetaminophen (TYLENOL) tablet 650 mg (650 mg Oral Given 11/06/18 0246)     Initial Impression / Assessment and Plan / ED Course  I have reviewed the triage vital signs and the nursing notes.  Pertinent labs & imaging results that were available during my care of the patient were reviewed by me and considered in my medical decision making (see chart for details).     CT scan was done to look for renal stone because her last CT had been done with contrast.  She was given Toradol IM for pain.  Patient is on Suboxone due to prior heroin abuse.  This abnormality seen in her uterus was also noted on a CT scan a year ago.  It does not appear to be new.  Recheck at 4:10 AM patient states her pain is better.  We discussed her test results.  She thinks she already has a appointment with GI, she was strongly encouraged to follow-up with [facility], [facility].  She was scheduled for an outpatient pelvic ultrasound to facilitate that visit.  Final Clinical Impressions(s) / ED Diagnoses   Final diagnoses:  Pelvic pain  Acute left flank pain    ED Discharge Orders    None    OTC motrin  Plan discharge  [name], MD,  Barbette Or, MD 11/06/18 [phone]

## 2018-11-06 NOTE — ED Notes (Signed)
Patient transported to CT 

## 2018-11-06 NOTE — Discharge Instructions (Addendum)
Please call [phone] to get an appointment to get your outpatient ultrasound of your pelvis done. Call [facility] to get an appointment to evaluate your pelvic pain. You can take ibuprofen 600 mg 4 times a day for pain. DO NOT TAKE MORE THAN THAT!!!

## 2018-11-07 ENCOUNTER — Emergency Department (HOSPITAL_COMMUNITY)
Admission: EM | Admit: 2018-11-07 | Discharge: 2018-11-08 | Disposition: A | Discharge status: Home / Self Care | Payer: Medicare HMO | Attending: Emergency Medicine | Admitting: Emergency Medicine

## 2018-11-07 ENCOUNTER — Encounter (HOSPITAL_COMMUNITY): Payer: Self-pay | Admitting: Emergency Medicine

## 2018-11-07 ENCOUNTER — Other Ambulatory Visit: Payer: Self-pay

## 2018-11-07 DIAGNOSIS — Z599 Problem related to housing and economic circumstances, unspecified: Secondary | ICD-10-CM | POA: Diagnosis not present

## 2018-11-07 DIAGNOSIS — Z79899 Other long term (current) drug therapy: Secondary | ICD-10-CM | POA: Insufficient documentation

## 2018-11-07 DIAGNOSIS — K920 Hematemesis: Secondary | ICD-10-CM | POA: Diagnosis not present

## 2018-11-07 DIAGNOSIS — Z3202 Encounter for pregnancy test, result negative: Secondary | ICD-10-CM | POA: Diagnosis not present

## 2018-11-07 DIAGNOSIS — Z6841 Body Mass Index (BMI) 40.0 and over, adult: Secondary | ICD-10-CM | POA: Insufficient documentation

## 2018-11-07 DIAGNOSIS — E669 Obesity, unspecified: Secondary | ICD-10-CM | POA: Diagnosis not present

## 2018-11-07 DIAGNOSIS — R109 Unspecified abdominal pain: Secondary | ICD-10-CM | POA: Diagnosis present

## 2018-11-07 DIAGNOSIS — R102 Pelvic and perineal pain: Secondary | ICD-10-CM | POA: Diagnosis not present

## 2018-11-07 DIAGNOSIS — F1120 Opioid dependence, uncomplicated: Secondary | ICD-10-CM | POA: Diagnosis not present

## 2018-11-07 DIAGNOSIS — J449 Chronic obstructive pulmonary disease, unspecified: Secondary | ICD-10-CM | POA: Diagnosis not present

## 2018-11-07 DIAGNOSIS — R69 Illness, unspecified: Secondary | ICD-10-CM | POA: Diagnosis not present

## 2018-11-07 DIAGNOSIS — F17210 Nicotine dependence, cigarettes, uncomplicated: Secondary | ICD-10-CM | POA: Diagnosis not present

## 2018-11-07 DIAGNOSIS — J45909 Unspecified asthma, uncomplicated: Secondary | ICD-10-CM | POA: Diagnosis not present

## 2018-11-07 DIAGNOSIS — R52 Pain, unspecified: Secondary | ICD-10-CM | POA: Diagnosis not present

## 2018-11-07 DIAGNOSIS — R1084 Generalized abdominal pain: Secondary | ICD-10-CM | POA: Diagnosis not present

## 2018-11-07 DIAGNOSIS — I10 Essential (primary) hypertension: Secondary | ICD-10-CM | POA: Diagnosis not present

## 2018-11-07 DIAGNOSIS — Z209 Contact with and (suspected) exposure to unspecified communicable disease: Secondary | ICD-10-CM | POA: Diagnosis not present

## 2018-11-07 LAB — COMPREHENSIVE METABOLIC PANEL
ALT: 47 U/L — ABNORMAL HIGH (ref 0–44)
AST: 34 U/L (ref 15–41)
Albumin: 4.1 g/dL (ref 3.5–5.0)
Alkaline Phosphatase: 52 U/L (ref 38–126)
Anion gap: 9 (ref 5–15)
BUN: 21 mg/dL — ABNORMAL HIGH (ref 6–20)
CO2: 19 mmol/L — ABNORMAL LOW (ref 22–32)
Calcium: 9 mg/dL (ref 8.9–10.3)
Chloride: 106 mmol/L (ref 98–111)
Creatinine, Ser: 0.73 mg/dL (ref 0.44–1.00)
GFR calc Af Amer: >60 mL/min (ref >60)
GFR calc non Af Amer: >60 mL/min (ref >60)
Glucose, Bld: 125 mg/dL — ABNORMAL HIGH (ref 70–99)
Potassium: 3.9 mmol/L (ref 3.5–5.1)
Sodium: 134 mmol/L — ABNORMAL LOW (ref 135–145)
Total Bilirubin: 0.3 mg/dL (ref 0.3–1.2)
Total Protein: 9.3 g/dL — ABNORMAL HIGH (ref 6.5–8.1)

## 2018-11-07 LAB — LIPASE, BLOOD: Lipase: 44 U/L (ref 11–51)

## 2018-11-07 LAB — CBC
HCT: 45.6 % (ref 36.0–46.0)
Hemoglobin: 15 g/dL (ref 12.0–15.0)
MCH: 28.4 pg (ref 26.0–34.0)
MCHC: 32.9 g/dL (ref 30.0–36.0)
MCV: 86.2 fL (ref 80.0–100.0)
Platelets: 312 10*3/uL (ref 150–400)
RBC: 5.29 MIL/uL — ABNORMAL HIGH (ref 3.87–5.11)
RDW: 14.9 % (ref 11.5–15.5)
WBC: 13.2 10*3/uL — ABNORMAL HIGH (ref 4.0–10.5)
nRBC: 0 % (ref 0.0–0.2)

## 2018-11-07 LAB — POC OCCULT BLOOD, ED: Fecal Occult Bld: NEGATIVE

## 2018-11-07 MED ORDER — ONDANSETRON HCL 4 MG/2ML IJ SOLN
4.0000 mg | Freq: Once | INTRAMUSCULAR | Status: AC
  Administered 2018-11-07: 4 mg via INTRAMUSCULAR
  Filled 2018-11-07: qty 2

## 2018-11-07 MED ORDER — KETOROLAC TROMETHAMINE 30 MG/ML IJ SOLN
30.0000 mg | Freq: Once | INTRAMUSCULAR | Status: AC
  Administered 2018-11-07: 30 mg via INTRAMUSCULAR
  Filled 2018-11-07: qty 1

## 2018-11-07 MED ORDER — SODIUM CHLORIDE 0.9% FLUSH: 3.0000 mL | Freq: Once | INTRAVENOUS | Status: DC

## 2018-11-07 NOTE — ED Provider Notes (Signed)
[facility] EMERGENCY DEPARTMENT Provider Note   CSN: [id number] Arrival date & time: 11/07/18  1956     History   Chief Complaint Chief Complaint  Patient presents with  . Abdominal Pain    HPI [name] is a 48 y.o. female.     Patient with history of previous IV drug abuse, on Suboxone, chronic pain, COPD, hepatitis C presenting with recurrent left-sided abdominal pain.  States she is had this pain for "more than a year" but is become gradually worse over the past 2 or 3 months.  She describes the pain is constant, worse with palpation and movement.  She was seen for the same pain about 2 days ago and a CT scan that showed a fibroid.  She denies any change in her chronic pain pattern.  There is been no fever, chills, nausea.  She did have 2 episodes of emesis today and noticed some red streaks in her first episode but none her second.  There is been no coffee-ground.  There is no been no diarrhea.  There is been no pain with urination or blood in the urine.  No vaginal bleeding or discharge.  She had a CT scan on June 30 and another CT scan on July 27.  She does not have a primary doctor.  She does not take any other pain medication other than ibuprofen and Tylenol.  The history is provided by the patient.    Past Medical History:  Diagnosis Date  . Anxiety   . Chronic back pain   . Chronic knee pain   . COPD (chronic obstructive pulmonary disease) ([place])   . DDD (degenerative disc disease)   . 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 Hepatitis C infection 07/13/2014  . IV drug abuse ([place])   . Nephrolithiasis   . Panic attacks   . Polysubstance abuse (H[facility])    cocaine, opiates, marijuana, amphetamines, "molly," crystal meth  . Pulmonary embolism ([place])   . Septic arthritis of right sternoclavicular joint ([place]) 02/21/2016    Patient Active Problem List   Diagnosis Date Noted  . Cellulitis 05/04/2017  . Dental infection 01/06/2017  . Abscess of multiple sites   .  Hepatitis C infection 07/13/2014  . IVDA (intravenous drug abuse) complicating pregnancy ([place])   . Opioid use disorder, severe, dependence ([place]) 06/17/2014    Past Surgical History:  Procedure Laterality Date  . APPLICATION OF A-CELL OF CHEST/ABDOMEN Right 09/07/2016   Procedure: APPLICATION OF A-CELL OF CHEST/ABDOMEN;  Surgeon: Wallace Going, DO;  Location: [place];  Service: Plastics;  Laterality: Right;  . CESAREAN SECTION    . I&D EXTREMITY Right 09/02/2016   Procedure: IRRIGATION AND DEBRIDEMENT OF RIGHT STERNOCLAVICULAR JOINT, STERNUM AND RIBS;  Surgeon: Gaye Pollack, MD;  Location: [place] OR;  Service: Vascular;  Laterality: Right;  . INCISION AND DRAINAGE OF WOUND Right 09/07/2016   Procedure: IRRIGATION AND DEBRIDEMENT OF CHEST WOUND, PLACEMENT OF A CELL AND VAC;  Surgeon: Wallace Going, DO;  Location: [place];  Service: Plastics;  Laterality: Right;  . NOSE SURGERY    . STERNAL WOUND DEBRIDEMENT N/A 02/22/2016   Procedure: IRRIGATION AND DEBRIDEMENT MANUBRIUM ABSCESS with wound cultures;  Surgeon: Melrose Nakayama, MD;  Location: [place];  Service: Thoracic;  Laterality: N/A;  . STERNAL WOUND DEBRIDEMENT N/A 02/23/2016   Procedure: DRESSING CHANGE UNDER ANESTHESIA  WITH WOUND VAC PLACEMENT;  Surgeon: Melrose Nakayama, MD;  Location: [place];  Service: Thoracic;  Laterality:  N/A;  . TEE WITHOUT CARDIOVERSION N/A 02/23/2016   Procedure: TRANSESOPHAGEAL ECHOCARDIOGRAM (TEE);  Surgeon: Jerline Pain, MD;  Location: [facility] ENDOSCOPY;  Service: Cardiovascular;  Laterality: N/A;  . TUBAL LIGATION     "tied, burned, clipped"     OB History    Gravida  2   Para  2   Term  2   Preterm      AB      Living  2     SAB      TAB      Ectopic      Multiple      Live Births               Home Medications    Prior to Admission medications   Medication Sig Start Date End Date Taking? Authorizing Provider  acetaminophen (TYLENOL) 500 MG tablet Take 1,000 mg by  mouth every 6 (six) hours as needed for mild pain or moderate pain.    [provider]  Buprenorphine HCl-Naloxone HCl (SUBOXONE) 8-2 MG FILM Place 1 Film under the tongue 2 (two) times a day.    [provider]  dicyclomine (BENTYL) 20 MG tablet Take 1 tablet (20 mg total) by mouth 3 (three) times daily as needed for spasms (abdominal cramping). 10/10/18   Long, Wonda Olds, MD  ondansetron (ZOFRAN ODT) 4 MG disintegrating tablet Take 1 tablet (4 mg total) by mouth every 8 (eight) hours as needed for nausea or vomiting. 10/10/18   Long, Wonda Olds, MD    Family History Family History  Problem Relation Age of Onset  . COPD Mother   . Hypertension Father   . Heart attack Father   . Diabetes Father   . Arthritis Other   . Lung disease Other   . Cancer Other   . Asthma Other     Social History Social History   Tobacco Use  . Smoking status: Current Every Day Smoker    Packs/day: 1.00    Years: 32.00    Pack years: 32.00    Types: Cigarettes  . Smokeless tobacco: Never Used  Substance Use Topics  . Alcohol use: Not Currently    Alcohol/week: 0.0 standard drinks  . Drug use: Not Currently    Types: IV, Cocaine, Marijuana, Methamphetamines, Heroin, Other-see comments    Comment: h/o Crystal meth, "molly," narcotics  clean for 13 months     Allergies   Mango flavor and Pyridium [phenazopyridine hcl]   Review of Systems Review of Systems  Constitutional: Negative for activity change, appetite change, fatigue and fever.  HENT: Negative for congestion and rhinorrhea.   Respiratory: Negative for cough, chest tightness and shortness of breath.   Cardiovascular: Negative for chest pain.  Gastrointestinal: Positive for abdominal pain, nausea and vomiting.  Genitourinary: Negative for dysuria, hematuria, vaginal bleeding and vaginal discharge.  Musculoskeletal: Positive for arthralgias, back pain and myalgias.  Skin: Negative for rash.  Neurological: Negative for  dizziness, weakness and headaches.    all other systems are negative except as noted in the HPI and PMH.    Physical Exam Updated Vital Signs BP (!) 120/107 (BP Location: Right Arm)   Pulse 86   Temp 97.8 F (36.6 C) (Oral)   Resp 17   Ht 5\' 1"  (1.549 m)   Wt 104.3 kg   LMP 07/01/2016   SpO2 96%   BMI 43.46 kg/m   Physical Exam Vitals signs and nursing note reviewed.  Constitutional:  General: She is not in acute distress.    Appearance: She is well-developed. She is obese.  HENT:     Head: Normocephalic and atraumatic.     Mouth/Throat:     Pharynx: No oropharyngeal exudate.  Eyes:     Conjunctiva/sclera: Conjunctivae normal.     Pupils: Pupils are equal, round, and reactive to light.  Neck:     Musculoskeletal: Normal range of motion and neck supple.     Comments: No meningismus. Cardiovascular:     Rate and Rhythm: Normal rate and regular rhythm.     Heart sounds: Normal heart sounds. No murmur.  Pulmonary:     Effort: Pulmonary effort is normal. No respiratory distress.     Breath sounds: Normal breath sounds.  Abdominal:     Palpations: Abdomen is soft.     Tenderness: There is abdominal tenderness. There is no guarding or rebound.     Comments: Soft, left-sided tenderness, no guarding or rebound  Genitourinary:    Comments: Chaperone present.  Normal female genitalia.  There is white discharge in the vaginal vault that is clear.  There is no CMT.  There is no lateralized adnexal tenderness or midline uterine tenderness Musculoskeletal: Normal range of motion.        General: No tenderness.     Comments: Left paraspinal lumbar tenderness, no CVAT  Skin:    General: Skin is warm.     Capillary Refill: Capillary refill takes less than 2 seconds.  Neurological:     General: No focal deficit present.     Mental Status: She is alert and oriented to person, place, and time. Mental status is at baseline.     Cranial Nerves: No cranial nerve deficit.      Motor: No abnormal muscle tone.     Coordination: Coordination normal.     Comments: No ataxia on finger to nose bilaterally. No pronator drift. 5/5 strength throughout. CN 2-12 intact.Equal grip strength. Sensation intact.   Psychiatric:        Behavior: Behavior normal.      ED Treatments / Results  Labs (all labs ordered are listed, but only abnormal results are displayed) Labs Reviewed  COMPREHENSIVE METABOLIC PANEL - Abnormal; Notable for the following components:      Result Value   Sodium 134 (*)    CO2 19 (*)    Glucose, Bld 125 (*)    BUN 21 (*)    Total Protein 9.3 (*)    ALT 47 (*)    All other components within normal limits  CBC - Abnormal; Notable for the following components:   WBC 13.2 (*)    RBC 5.29 (*)    All other components within normal limits  URINALYSIS, ROUTINE W REFLEX MICROSCOPIC - Abnormal; Notable for the following components:   APPearance TURBID (*)    Specific Gravity, Urine 1.039 (*)    Bilirubin Urine SMALL (*)    Protein, ur 100 (*)    Leukocytes,Ua TRACE (*)    WBC, UA >50 (*)    Bacteria, UA RARE (*)    Squamous Epithelial / LPF >50 (*)    All other components within normal limits  WET PREP, GENITAL  URINE CULTURE  LIPASE, BLOOD  PREGNANCY, URINE  POC OCCULT BLOOD, ED  GC/CHLAMYDIA PROBE AMP ([place]) NOT AT [facility]    EKG None  Radiology Ct Renal Stone Study  Result Date: 11/06/2018 CLINICAL DATA:  Left-sided abdominal pain radiating to back EXAM: CT  ABDOMEN AND PELVIS WITHOUT CONTRAST TECHNIQUE: Multidetector CT imaging of the abdomen and pelvis was performed following the standard protocol without IV contrast. COMPARISON:  10/10/2018 FINDINGS: Lower chest: Lung bases are clear. Hepatobiliary: Unenhanced liver is unremarkable. Gallbladder is unremarkable. No intrahepatic or extrahepatic ductal dilatation. Pancreas: Within normal limits. Spleen: Within normal limits. Adrenals/Urinary Tract: Adrenal glands are within normal  limits. Kidneys are within normal limits. No renal, ureteral, or bladder calculi. No hydronephrosis. Bladder is underdistended but unremarkable. Stomach/Bowel: Stomach is within normal limits. No evidence of bowel obstruction. Normal appendix (series 3/image 58). No colonic wall thickening or inflammatory changes. Vascular/Lymphatic: No evidence of abdominal aortic aneurysm. Mild atherosclerotic calcifications the infrarenal abdominal aorta. No suspicious abdominopelvic lymphadenopathy. Reproductive: Mildly heterogeneous appearance of the left uterine fundus, poorly evaluated on unenhanced CT. Bilateral ovaries are within normal limits. Other: No abdominopelvic ascites. Musculoskeletal: Mild degenerative changes of the lower thoracic/upper lumbar spine. IMPRESSION: No renal, ureteral, or bladder calculi.  No hydronephrosis. No evidence of bowel obstruction.  Normal appendix. Heterogeneous appearance of the left uterine fundus, poorly evaluated on unenhanced CT. Degenerating uterine fibroid is a consideration. Consider pelvic ultrasound for further evaluation, if clinically warranted. Electronically Signed   By: Julian Hy M.D.   On: 11/06/2018 02:47    Procedures Procedures (including critical care time)  Medications Ordered in ED Medications  sodium chloride flush (NS) 0.9 % injection 3 mL (has no administration in time range)  ketorolac (TORADOL) 30 MG/ML injection 30 mg (has no administration in time range)  ondansetron (ZOFRAN) injection 4 mg (has no administration in time range)     Initial Impression / Assessment and Plan / ED Course  I have reviewed the triage vital signs and the nursing notes.  Pertinent labs & imaging results that were available during my care of the patient were reviewed by me and considered in my medical decision making (see chart for details).       Acute on chronic abdominal pain now with nausea and vomiting with some blood streaks.  Abdomen soft without  peritoneal signs. Vitals are stable.  Hemoglobin is stable.  Hemoccult is negative.  Low suspicion for significant GI bleed.  Question Mallory-Weiss tear.  Unclear whether patient actually did have blood in her emesis.  CT scan from yesterday reviewed.  Shows heterogeneous uterus with concern for degrading fibroid.  She request Toradol for pain which was accommodated.  Avoiding narcotics given her history of IV drug abuse and Suboxone use.  Pain ongoing for several months.  Low suspicion for ovarian torsion. Patient agreeable to ultrasound tomorrow which is not available today.  UA appears contaminated.  Will send for culture.  Work-up is reassuring.  Urine culture pending.  Patient tolerating p.o. with a soft abdomen.  She is agreeable to return for ultrasound later today.  Low suspicion for ovarian torsion as her abdominal pain is been going on for several months.  Her pelvic exam today is benign.  Follow-up with her PCP as well as gynecologist.  Return precautions discussed.  Final Clinical Impressions(s) / ED Diagnoses   Final diagnoses:  Pelvic pain in female    ED Discharge Orders    None       Isaish Alemu, Annie Main, MD 11/08/18 [phone]

## 2018-11-07 NOTE — ED Notes (Signed)
Pt states she would like" a shot of Toradol in her butt"

## 2018-11-07 NOTE — ED Notes (Signed)
Called no answer

## 2018-11-07 NOTE — ED Triage Notes (Signed)
Pt c/o lower abd pain and states she is vomiting blood. Pt was seen for the same a few days ago here.

## 2018-11-07 NOTE — ED Notes (Signed)
RN attempted IV x's 3

## 2018-11-08 DIAGNOSIS — Z599 Problem related to housing and economic circumstances, unspecified: Secondary | ICD-10-CM | POA: Diagnosis not present

## 2018-11-08 DIAGNOSIS — J45909 Unspecified asthma, uncomplicated: Secondary | ICD-10-CM | POA: Diagnosis not present

## 2018-11-08 DIAGNOSIS — R69 Illness, unspecified: Secondary | ICD-10-CM | POA: Diagnosis not present

## 2018-11-08 DIAGNOSIS — I10 Essential (primary) hypertension: Secondary | ICD-10-CM | POA: Diagnosis not present

## 2018-11-08 DIAGNOSIS — F1120 Opioid dependence, uncomplicated: Secondary | ICD-10-CM | POA: Diagnosis not present

## 2018-11-08 LAB — URINALYSIS, ROUTINE W REFLEX MICROSCOPIC
Glucose, UA: NEGATIVE mg/dL
Hgb urine dipstick: NEGATIVE
Ketones, ur: NEGATIVE mg/dL
Nitrite: NEGATIVE
Protein, ur: 100 mg/dL — AB
Specific Gravity, Urine: 1.039 — ABNORMAL HIGH (ref 1.005–1.030)
Squamous Epithelial / HPF: >50 — ABNORMAL HIGH (ref 0–5)
WBC, UA: >50 WBC/hpf — ABNORMAL HIGH (ref 0–5)
pH: 5 (ref 5.0–8.0)

## 2018-11-08 LAB — PREGNANCY, URINE: Preg Test, Ur: NEGATIVE

## 2018-11-08 NOTE — Discharge Instructions (Signed)
Follow-up with the ultrasound tomorrow as scheduled.  You can call the number to schedule a time that is convenient for you.  You should also see the gynecologist in the office regarding the fibroid in your uterus.  Return to the ED with worsening pain, fever, vomiting, any other concerns.

## 2018-11-09 LAB — URINE CULTURE

## 2018-11-09 LAB — GC/CHLAMYDIA PROBE AMP (~~LOC~~) NOT AT ARMC
Chlamydia: NEGATIVE
Neisseria Gonorrhea: NEGATIVE

## 2018-11-13 DIAGNOSIS — R69 Illness, unspecified: Secondary | ICD-10-CM | POA: Diagnosis not present

## 2018-11-13 DIAGNOSIS — F1120 Opioid dependence, uncomplicated: Secondary | ICD-10-CM | POA: Diagnosis not present

## 2018-11-13 DIAGNOSIS — I10 Essential (primary) hypertension: Secondary | ICD-10-CM | POA: Diagnosis not present

## 2018-11-13 DIAGNOSIS — J45909 Unspecified asthma, uncomplicated: Secondary | ICD-10-CM | POA: Diagnosis not present

## 2018-11-13 DIAGNOSIS — Z599 Problem related to housing and economic circumstances, unspecified: Secondary | ICD-10-CM | POA: Diagnosis not present

## 2018-11-16 NOTE — Progress Notes (Signed)
Referring Provider: [facility] ED Physician Primary Care Physician:  Patient, [name] Primary Gastroenterologist:  [name]  Chief Complaint  Patient presents with  . Abdominal Mueller    1 YEAR    HPI:   [name] is a 48 y.o. female presenting today at the request of [name] ER for abdominal Mueller. Past medical history significant for polysubstance abuse currently in recovery and on Suboxone, COPD, and Hep C. She has been seen in the ED 3 times in the last 2 months for abdominal Mueller. On 6/30, C/T abdomen and pelvis with contrast with collapsed appearance versus mild wall thickening/colitis of sigmoid colon although not much inflammatory change in surrounding fat. Also noted thickening of endometrial complex. Patient recommended to have f/u pelvis US and follow-up with GI. Prescribed bentyl and zofran. On 7/27, CT renal study with heterogeneous appearance of the left uterine fundus, poorly evaluated on unenhanced CT. Degenerating uterine fibroid is a consideration. No colonic wall thickening or inflammatory changes. Patient recommended to follow-up with OBGYN. Seen again on 7/28, recommended again to follow up with GYN.   Today patient states she has had lower abdominal Mueller x1 year. Started out in the low pelvic area below her C-section; however, it has started coming up across her lower abdomen and Mueller around to her back on the left side. States Mueller is becoming more frequent and worsening in intensity. Will cry at times. Daily, off and on all day long. Sharp, stabbing Mueller. Will last about 1 hour then resolve. Abdominal Mueller not affected by bowel movements. Not affected by meals. Can't identify trigger. Wakes her up at night. Tries to lay perfectly still when occurring. Taking a lot of ibuprofen. Taking 1600 mg of ibuprofen 3-4 times a day. She is taking this even before the Mueller comes on to try to keep it from starting. Trouble having BMs. "BM will be so big I have to pick it out of me." Tries  to have a BM daily. Hard with straining. Not taking anything for this. Constipation the last 3-4 years. No blood in the stool, no black stools. No upper abdominal Mueller. Heart/reflux 2-3 times a week. When she burps, will feel like she throws up some. No specific triggers. Had 3 days of vomiting prior to her last ED visit on 7/28. Reports the last 2 times she vomited, there was some streaks of blood. No coffee ground emesis. No vomiting since then. Nauseous at times. No diarrhea. No unintentional weight loss. Appetite is good. No Mueller with urination or burning, urgency, or frequency. No vaginal discharge. No fever or chills.     She has not followed up with OBGYN yet. Plans to have pelvic US ordered by ED completed. States she uses [facility] and has to schedule times to get labs or any imaging done.   History of heroin and cocaine. She is on suboxone and reports being clean for 15 month. [name] prescribes patients suboxone.    Hepatitis C: Never been treated. Diagnosed back in 2016. No jaundice, confusion. Does have lower extremity edema. Comes and goes over the last 5 years. Eats a lot of salt. States, "I love salt." Thinks she has some swelling in her abdomen because her pants are getting tighter.  History of incarceration. Dad had stomach cancer that spread to liver. He passed 6 months ago.   Denies fever, chills, lightheadedness, dizziness, or feeling like she will pass out. No chest Mueller, palpitations, shortness of breath, coughing,  or wheezing.    Past Medical History:  Diagnosis Date  . Anxiety   . Chronic back Mueller   . Chronic knee Mueller   . COPD (chronic obstructive pulmonary disease) ([place])   . DDD (degenerative disc disease)   . 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 Hepatitis C infection 07/13/2014  . IV drug abuse ([place])   . Nephrolithiasis   . Panic attacks   . Polysubstance abuse ([facility])    cocaine, opiates, marijuana, amphetamines, "molly," crystal meth  . Pulmonary  embolism ([place])   . Septic arthritis of right sternoclavicular joint ([place]) 02/21/2016    Past Surgical History:  Procedure Laterality Date  . APPLICATION OF A-CELL OF CHEST/ABDOMEN Right 09/07/2016   Procedure: APPLICATION OF A-CELL OF CHEST/ABDOMEN;  Surgeon: Julie Going, DO;  Location: [place];  Service: Plastics;  Laterality: Right;  . CESAREAN SECTION    . I&D EXTREMITY Right 09/02/2016   Procedure: IRRIGATION AND DEBRIDEMENT OF RIGHT STERNOCLAVICULAR JOINT, STERNUM AND RIBS;  Surgeon: Julie Pollack, MD;  Location: [place] [facility];  Service: Vascular;  Laterality: Right;  . INCISION AND DRAINAGE OF WOUND Right 09/07/2016   Procedure: IRRIGATION AND DEBRIDEMENT OF CHEST WOUND, PLACEMENT OF A CELL AND VAC;  Surgeon: Julie Going, DO;  Location: [place];  Service: Plastics;  Laterality: Right;  . NOSE SURGERY    . STERNAL WOUND DEBRIDEMENT N/A 02/22/2016   Procedure: IRRIGATION AND DEBRIDEMENT MANUBRIUM ABSCESS with wound cultures;  Surgeon: Julie Nakayama, MD;  Location: [place];  Service: Thoracic;  Laterality: N/A;  . STERNAL WOUND DEBRIDEMENT N/A 02/23/2016   Procedure: DRESSING CHANGE UNDER ANESTHESIA  WITH WOUND VAC PLACEMENT;  Surgeon: Julie Nakayama, MD;  Location: [place];  Service: Thoracic;  Laterality: N/A;  . TEE WITHOUT CARDIOVERSION N/A 02/23/2016   Procedure: TRANSESOPHAGEAL ECHOCARDIOGRAM (TEE);  Surgeon: Julie Pain, MD;  Location: [facility] ENDOSCOPY;  Service: Cardiovascular;  Laterality: N/A;  . TUBAL LIGATION     "tied, burned, clipped"    Current Outpatient Medications  Medication Sig Dispense Refill  . acetaminophen (TYLENOL) 500 MG tablet Take 1,000 mg by mouth every 6 (six) hours as needed for mild Mueller or moderate Mueller.    . Buprenorphine HCl-Naloxone HCl (SUBOXONE) 8-2 MG FILM Place 1 Film under the tongue 2 (two) times a day.    . ibuprofen (ADVIL) 200 MG tablet Take 200 mg by mouth every 6 (six) hours as needed.    . lubiprostone (AMITIZA) 24 MCG  capsule Take 1 capsule (24 mcg total) by mouth 2 (two) times daily with a meal. 60 capsule 5  . pantoprazole (PROTONIX) 40 MG tablet Take 1 tablet (40 mg total) by mouth daily. 30 tablet 3  . polyethylene glycol-electrolytes (NULYTELY/GOLYTELY) 420 g solution Take 4,000 mLs by mouth once for 1 dose. 4000 mL 0   No current facility-administered medications for this visit.     Allergies as of 11/17/2018 - Review Complete 11/17/2018  Allergen Reaction Noted  . Mango flavor Hives and Swelling 10/01/2014  . Pyridium [phenazopyridine hcl] Other (See Comments) 09/09/2012    Family History  Problem Relation Age of Onset  . COPD Mother   . Hypertension Father   . Heart attack Father   . Diabetes Father   . Stomach cancer Father        metastasized to liver  . Arthritis Other   . Lung disease Other   . Cancer Other   . Asthma Other   . Colon  cancer Paternal Grandfather 1  . Colon cancer Paternal Uncle 56    Social History   Socioeconomic History  . Marital status: Divorced    Spouse name: Not on file  . Number of children: Not on file  . Years of education: 9th grade  . Highest education level: Not on file  Occupational History    Employer: UNEMPLOYED  Social Needs  . Financial resource strain: Not on file  . Food insecurity    Worry: Not on file    Inability: Not on file  . Transportation needs    Medical: Not on file    Non-medical: Not on file  Tobacco Use  . Smoking status: Current Every Day Smoker    Packs/day: 1.00    Years: 32.00    Pack years: 32.00    Types: Cigarettes  . Smokeless tobacco: Never Used  Substance and Sexual Activity  . Alcohol use: Not Currently    Alcohol/week: 0.0 standard drinks    Comment: quit 6 years ago  . Drug use: Not Currently    Types: IV, Cocaine, Marijuana, Methamphetamines, Heroin, Other-see comments    Comment: h/o Crystal meth, "molly," narcotics  clean for 13 months  . Sexual activity: Yes    Birth control/protection:  Surgical  Lifestyle  . Physical activity    Days per week: Not on file    Minutes per session: Not on file  . Stress: Not on file  Relationships  . Social Herbalist on phone: Not on file    Gets together: Not on file    Attends religious service: Not on file    Active member of club or organization: Not on file    Attends meetings of clubs or organizations: Not on file    Relationship status: Not on file  . Intimate partner violence    Fear of current or ex partner: Not on file    Emotionally abused: Not on file    Physically abused: Not on file    Forced sexual activity: Not on file  Other Topics Concern  . Not on file  Social History Narrative  . Not on file    Review of Systems: Gen: See HPI CV: See HPI Resp: See HPI GI: See HPI GU : See HPI MS: Denies joint Mueller, muscle weakness, cramps Derm: Denies rash, itching, dry skin Psych: Admits to depression and anxiety due to losing dad to cancer 6 months ago.  Heme: Denies bruising, bleeding  Physical Exam: BP 139/75   Pulse (!) 51   Temp 97.7 F (36.5 C) (Oral)   Ht 5' 1" (1.549 m)   Wt 234 lb 9.6 oz (106.4 kg)   LMP 07/01/2016   BMI 44.33 kg/m  General:   Alert and oriented. Pleasant and cooperative. Well-nourished and well-developed. Obese  Head:  Normocephalic and atraumatic. Eyes:  Without icterus, sclera clear and conjunctiva pink.  Ears:  Normal auditory acuity. Nose:  No deformity, discharge,  or lesions. Lungs:  Clear to auscultation bilaterally. No wheezes, rales, or rhonchi. No distress.  Heart:  S1, S2 present without murmurs appreciated.  Abdomen:  +BS, soft, non-tender and non-distended. No HSM noted. No guarding or rebound. No masses appreciated.  Rectal:  Deferred  Msk:  Symmetrical without gross deformities. Normal posture. Extremities:  With 2+ LE pitting edema bilaterally. Neurologic:  Alert and  oriented x4;  grossly normal neurologically. Skin:  Intact without significant lesions  or rashes. Psych:  Alert and cooperative. Normal  mood and affect.   Recent labs:   11/08/18: Urine culture with multiple species, recommend recollection.   11/07/18: Fecal occult blood negative   Urine Pregnancy Negative   CBC: WBC 13.2, Hgb 15.0, platelets 312   CMP: AST 34, ALT 47, Alk phos 52, bilirubin 0.3, Mueller 134, K 3.9, Cr 0.73   Lipase 44   UA: Trace leukocytes, >50 WBC, >50 squamous epithelial, Bacteria rare.    GC/Chlamydia: negative

## 2018-11-17 ENCOUNTER — Other Ambulatory Visit: Payer: Self-pay

## 2018-11-17 ENCOUNTER — Encounter: Payer: Self-pay | Admitting: Internal Medicine

## 2018-11-17 ENCOUNTER — Ambulatory Visit (INDEPENDENT_AMBULATORY_CARE_PROVIDER_SITE_OTHER): Payer: Medicare HMO | Admitting: Gastroenterology

## 2018-11-17 ENCOUNTER — Other Ambulatory Visit: Payer: Self-pay | Admitting: *Deleted

## 2018-11-17 ENCOUNTER — Encounter: Payer: Self-pay | Admitting: Gastroenterology

## 2018-11-17 ENCOUNTER — Encounter: Payer: Self-pay | Admitting: *Deleted

## 2018-11-17 VITALS — BP 139/75 | HR 51 | Temp 97.7°F | Ht 61.0 in | Wt 234.6 lb

## 2018-11-17 DIAGNOSIS — R8290 Unspecified abnormal findings in urine: Secondary | ICD-10-CM | POA: Diagnosis not present

## 2018-11-17 DIAGNOSIS — R102 Pelvic and perineal pain: Secondary | ICD-10-CM

## 2018-11-17 DIAGNOSIS — K219 Gastro-esophageal reflux disease without esophagitis: Secondary | ICD-10-CM | POA: Insufficient documentation

## 2018-11-17 DIAGNOSIS — Z800 Family history of malignant neoplasm of digestive organs: Secondary | ICD-10-CM | POA: Diagnosis not present

## 2018-11-17 DIAGNOSIS — K5900 Constipation, unspecified: Secondary | ICD-10-CM

## 2018-11-17 DIAGNOSIS — B182 Chronic viral hepatitis C: Secondary | ICD-10-CM

## 2018-11-17 DIAGNOSIS — R109 Unspecified abdominal pain: Secondary | ICD-10-CM

## 2018-11-17 DIAGNOSIS — R1030 Lower abdominal pain, unspecified: Secondary | ICD-10-CM

## 2018-11-17 MED ORDER — PEG 3350-KCL-NA BICARB-NACL 420 G PO SOLR: 4000.0000 mL | Freq: Once | ORAL | 0 refills | Status: AC

## 2018-11-17 MED ORDER — PANTOPRAZOLE SODIUM 40 MG PO TBEC: 40.0000 mg | DELAYED_RELEASE_TABLET | Freq: Every day | ORAL | 3 refills | Status: DC

## 2018-11-17 MED ORDER — LUBIPROSTONE 24 MCG PO CAPS: 24.0000 ug | ORAL_CAPSULE | Freq: Two times a day (BID) | ORAL | 5 refills | Status: DC

## 2018-11-17 NOTE — Assessment & Plan Note (Signed)
Addressed under abdominal pain.  

## 2018-11-17 NOTE — Assessment & Plan Note (Addendum)
48 y.o. female with sharp lower abdominal pain x 1 year. Pain started out suprapubic but now comes up across her lower abdomen and towards her back on the left side. Frequency and severity of pain is increasing. Now on and off daily lasting about 1 hour at a time. Will wake her up at night. Admits to constipation for last 3 years, but states pain isn't affected by BMs. No association with meals. No urinary symptoms or vaginal discharge. No unintentional weight loss. No fever or chills. She does have intermittent nausea. 3 days of vomiting due to significant pain prior to ED visit on 7/28. Reports red streaks of blood in the last 2 episodes of emesis. No vomiting since. No hematochezia or melena. Taking a significant amount of ibuprofen. Reports 1600 mg 3-4 times a day. Abdominal exam benign today. C/T abdomen and pelvis with contrast on 6/30 with collapsed appearance versus mild wall thickening/colitis of sigmoid colon. Also noted thickening of endometrial complex. On 7/27, CT renal study with heterogeneous appearance of the left uterine fundus.. No colonic wall thickening noted. Labs on 7/28 remarkable for WBC 13.2. Fecal occult blood negative. UA with Trace leukocytes, >50 WBC. GC/Chlamydia negative. Urine culture on 7/29 with multiple species, recommend recollection. Family history of paternal GF with colon cancer at age 72 and paternal uncle with colon cancer at age 81.   Higher suspicion for GYN etiology of abdominal pain, possibly influenced by constipation, but as patient is 48 y.o., questionable wall thickening of the sigmoid colon on prior CT, and family history of colon cancer, I feel it is warranted to go ahead and pursue colonoscopy for this patient to rule out underlying colonic etiology. Of note, there are recommendations by the american cancer society to start colon cancer screening at age 48.   Proceed with TCS with Dr. Gala Romney with propofol in the near future. The risks, benefits, and alternatives  have been discussed in detail with patient. They have stated understanding and desire to proceed.  Start Amitiza 24 mcg BID with food.  Update urinalysis and culture as last culture had many species and recommended recollection.  Counseled patient to decrease ibuprofen.  Try using tylenol first. Only use as needed when pain comes on rather than pre-treating.  Could alternate tylenol and ibuprofen if needed. No more than 3200 mg a day of ibuprofen. No more than 2 g of tylenol a day.  Patient is to call [facility] to follow-up on scheduling an appointment.  Reminded patient to go back to [facility] to have pelvic US completed.  Follow-up in 3 months.

## 2018-11-17 NOTE — Assessment & Plan Note (Addendum)
Patient with heartburn and reflux 2-3 times a week. No specific triggers. She is taking a significant amount of ibuprofen, 1600 mg 3-4 times a day. Also with intermittent nausea. 3 days of vomiting prior to last ED visit on 7/29 which patient attributes to her lower abdominal pain. Last 2 episodes of emesis with blood streaks. No coffee ground emesis. No vomiting since. No epigastric pain. No hematochezia or melena. No unintentional weight loss. Hemoglobin on 7/28 was 15.0. Fecal occult blood negative.No lightheadedness, dizziness, pre-syncope or syncope. Suspect NSAID induced gastritis/duodenitis/esophagitis.  Start Protonix 40 mg daily.  Decrease ibuprofen. Try tylenol instead. Only use if needed, not pre-treatment for pain. Can alternate Tylenol and ibuprofen if necessary. No more than 3200 mg ibuprofen a day. No more than 2 g tylenol a day.

## 2018-11-17 NOTE — Assessment & Plan Note (Addendum)
Addressed under abdominal pain.  

## 2018-11-17 NOTE — Assessment & Plan Note (Addendum)
History of hepatitis C, genotype 1a. Patient was diagnosed in 2016, but has never been treated. History of substance abuse. Currently in recovery on suboxone. Has been clean for 15 months. CT abdomen and pelvis with contrast on 6/30 had no evidence of cirrhosis or focal liver abnormality. No free fluid. LFTs on 7/28 only with ALT slightly elevated at 47. Bilirubin and albumin normal. Patient does have some LE edema. Thinks she has abdominal swelling because her pants are tighter. Admits to eating a lot of salt. No jaundice or confusion. Abdomen is soft on exam and non distended. 2+ pitting edema bilaterally in the LE. Do not suspect decompensated liver disease as no cirrhosis or ascites was noted on CT. LE edema likely secondary to obesity and high salt intake.   Patient is going to need treatment for Hep C. I spoke with Roosevelt Locks at the [facility] in [place] and there is no concern to treating this patient. We will need updated labs and abdominal US with elastography prior to pursuing treatment. However, I will wait to continue the evaluation and initiation of treatment until her lower abdominal pain is adequately managed and her nausea resolves. I would not want to start treatment and patient have vomiting again and miss doses of anti-hepatitis C medication.     Counseled on not sharing razors or tooth brushes with others, covering bleeding wounds to prevent other coming into contact with her blood, and cleaning blood spills with 1 part bleach and 9 parts water.  Continue to avoid alcohol Advised she follow a low sodium diet, <2 grams daily.

## 2018-11-17 NOTE — Patient Instructions (Addendum)
We will get you scheduled for colonoscopy in the near future with Dr. Gala Romney.   Please have UA and urine culture completed.   Please start taking Protonix 40 mg daily 30 minutes before meals.   Please start Amitiza 24 mcg twice daily with food.   I am going to reach out to the liver clinic in [place] to discuss possible Hep C treatment. Once I discuss with them, I will place necessary labs and complete an abdominal ultrasound.   Limit ibuprofen. Try tylenol instead. May alternate ibuprofen and tylenol if needed.  Low salt diet. <2 g daily    Plan to follow-up in 3 months.   Julie Altes, PA-C [facility]

## 2018-11-20 DIAGNOSIS — Z599 Problem related to housing and economic circumstances, unspecified: Secondary | ICD-10-CM | POA: Diagnosis not present

## 2018-11-20 DIAGNOSIS — F1120 Opioid dependence, uncomplicated: Secondary | ICD-10-CM | POA: Diagnosis not present

## 2018-11-20 DIAGNOSIS — J45909 Unspecified asthma, uncomplicated: Secondary | ICD-10-CM | POA: Diagnosis not present

## 2018-11-20 DIAGNOSIS — I10 Essential (primary) hypertension: Secondary | ICD-10-CM | POA: Diagnosis not present

## 2018-11-20 DIAGNOSIS — R69 Illness, unspecified: Secondary | ICD-10-CM | POA: Diagnosis not present

## 2018-11-20 NOTE — Progress Notes (Signed)
[name] patient 

## 2018-11-21 DIAGNOSIS — F319 Bipolar disorder, unspecified: Secondary | ICD-10-CM | POA: Diagnosis not present

## 2018-11-21 DIAGNOSIS — F4320 Adjustment disorder, unspecified: Secondary | ICD-10-CM | POA: Diagnosis not present

## 2018-11-21 DIAGNOSIS — F329 Major depressive disorder, single episode, unspecified: Secondary | ICD-10-CM | POA: Diagnosis not present

## 2018-11-21 DIAGNOSIS — R69 Illness, unspecified: Secondary | ICD-10-CM | POA: Diagnosis not present

## 2018-11-21 DIAGNOSIS — I10 Essential (primary) hypertension: Secondary | ICD-10-CM | POA: Diagnosis not present

## 2018-11-21 DIAGNOSIS — F1120 Opioid dependence, uncomplicated: Secondary | ICD-10-CM | POA: Diagnosis not present

## 2018-11-21 DIAGNOSIS — J45909 Unspecified asthma, uncomplicated: Secondary | ICD-10-CM | POA: Diagnosis not present

## 2018-11-21 DIAGNOSIS — Z599 Problem related to housing and economic circumstances, unspecified: Secondary | ICD-10-CM | POA: Diagnosis not present

## 2018-11-23 NOTE — Patient Instructions (Signed)
[name]  11/23/2018     @PREFPERIOPPHARMACY @   Your procedure is scheduled on  11/27/2018.  Report to [facility] at  1130  A.M.  Call this number if you have problems the morning of surgery:  [phone]   Remember:  Do not eat or drink after midnight.                         Take these medicines the morning of surgery with A SIP OF WATER  Suboxone, protonix.    Do not wear jewelry, make-up or nail polish.  Do not wear lotions, powders, or perfumes. Please wear deodorant and brush your teeth.  Do not shave 48 hours prior to surgery.  Men may shave face and neck.  Do not bring valuables to the hospital.  [facility] is not responsible for any belongings or valuables.  Contacts, dentures or bridgework may not be worn into surgery.  Leave your suitcase in the car.  After surgery it may be brought to your room.  For patients admitted to the hospital, discharge time will be determined by your treatment team.  Patients discharged the day of surgery will not be allowed to drive home.   Name and phone number of your driver:   family Special instructions:  None  Please read over the following fact sheets that you were given. Anesthesia Post-op Instructions and Care and Recovery After Surgery       Colonoscopy, Adult, Care After This sheet gives you information about how to care for yourself after your procedure. Your health care provider may also give you more specific instructions. If you have problems or questions, contact your health care provider. What can I expect after the procedure? After the procedure, it is common to have:  A small amount of blood in your stool for 24 hours after the procedure.  Some gas.  Mild abdominal cramping or bloating. Follow these instructions at home: General instructions  For the first 24 hours after the procedure: ? Do not drive or use machinery. ? Do not sign important documents. ? Do not drink alcohol. ? Do your  regular daily activities at a slower pace than normal. ? Eat soft, easy-to-digest foods.  Take over-the-counter or prescription medicines only as told by your health care provider. Relieving cramping and bloating   Try walking around when you have cramps or feel bloated.  Apply heat to your abdomen as told by your health care provider. Use a heat source that your health care provider recommends, such as a moist heat pack or a heating pad. ? Place a towel between your skin and the heat source. ? Leave the heat on for 20-30 minutes. ? Remove the heat if your skin turns bright red. This is especially important if you are unable to feel pain, heat, or cold. You may have a greater risk of getting burned. Eating and drinking   Drink enough fluid to keep your urine pale yellow.  Resume your normal diet as instructed by your health care provider. Avoid heavy or fried foods that are hard to digest.  Avoid drinking alcohol for as long as instructed by your health care provider. Contact a health care provider if:  You have blood in your stool 2-3 days after the procedure. Get help right away if:  You have more than a small spotting of blood in your stool.  You pass large blood clots in  your stool.  Your abdomen is swollen.  You have nausea or vomiting.  You have a fever.  You have increasing abdominal pain that is not relieved with medicine. Summary  After the procedure, it is common to have a small amount of blood in your stool. You may also have mild abdominal cramping and bloating.  For the first 24 hours after the procedure, do not drive or use machinery, sign important documents, or drink alcohol.  Contact your health care provider if you have a lot of blood in your stool, nausea or vomiting, a fever, or increased abdominal pain. This information is not intended to replace advice given to you by your health care provider. Make sure you discuss any questions you have with your  health care provider. Document Released: 11/11/2003 Document Revised: 01/19/2017 Document Reviewed: 06/10/2015 Elsevier Patient Education  2020 [place] After These instructions provide you with information about caring for yourself after your procedure. Your health care provider may also give you more specific instructions. Your treatment has been planned according to current medical practices, but problems sometimes occur. Call your health care provider if you have any problems or questions after your procedure. What can I expect after the procedure? After your procedure, you may:  Feel sleepy for several hours.  Feel clumsy and have poor balance for several hours.  Feel forgetful about what happened after the procedure.  Have poor judgment for several hours.  Feel nauseous or vomit.  Have a sore throat if you had a breathing tube during the procedure. Follow these instructions at home: For at least 24 hours after the procedure:      Have a responsible adult stay with you. It is important to have someone help care for you until you are awake and alert.  Rest as needed.  Do not: ? Participate in activities in which you could fall or become injured. ? Drive. ? Use heavy machinery. ? Drink alcohol. ? Take sleeping pills or medicines that cause drowsiness. ? Make important decisions or sign legal documents. ? Take care of children on your own. Eating and drinking  Follow the diet that is recommended by your health care provider.  If you vomit, drink water, juice, or soup when you can drink without vomiting.  Make sure you have little or no nausea before eating solid foods. General instructions  Take over-the-counter and prescription medicines only as told by your health care provider.  If you have sleep apnea, surgery and certain medicines can increase your risk for breathing problems. Follow instructions from your health care  provider about wearing your sleep device: ? Anytime you are sleeping, including during daytime naps. ? While taking prescription pain medicines, sleeping medicines, or medicines that make you drowsy.  If you smoke, do not smoke without supervision.  Keep all follow-up visits as told by your health care provider. This is important. Contact a health care provider if:  You keep feeling nauseous or you keep vomiting.  You feel light-headed.  You develop a rash.  You have a fever. Get help right away if:  You have trouble breathing. Summary  For several hours after your procedure, you may feel sleepy and have poor judgment.  Have a responsible adult stay with you for at least 24 hours or until you are awake and alert. This information is not intended to replace advice given to you by your health care provider. Make sure you discuss any questions you have with  your health care provider. Document Released: 07/20/2015 Document Revised: 06/27/2017 Document Reviewed: 07/20/2015 Elsevier Patient Education  2020 [employer].

## 2018-11-24 ENCOUNTER — Encounter (HOSPITAL_COMMUNITY)
Admission: RE | Admit: 2018-11-24 | Discharge: 2018-11-24 | Disposition: A | Discharge status: Home / Self Care | Payer: Medicare HMO | Source: Ambulatory Visit | Attending: Internal Medicine | Admitting: Internal Medicine

## 2018-11-24 ENCOUNTER — Other Ambulatory Visit: Payer: Self-pay

## 2018-11-24 ENCOUNTER — Other Ambulatory Visit (HOSPITAL_COMMUNITY)
Admission: RE | Admit: 2018-11-24 | Discharge: 2018-11-24 | Disposition: A | Discharge status: Home / Self Care | Payer: Medicare HMO | Source: Ambulatory Visit | Attending: Internal Medicine | Admitting: Internal Medicine

## 2018-11-24 ENCOUNTER — Encounter (HOSPITAL_COMMUNITY): Payer: Self-pay

## 2018-11-24 DIAGNOSIS — Z20828 Contact with and (suspected) exposure to other viral communicable diseases: Secondary | ICD-10-CM | POA: Diagnosis not present

## 2018-11-24 DIAGNOSIS — Z01818 Encounter for other preprocedural examination: Secondary | ICD-10-CM | POA: Insufficient documentation

## 2018-11-24 HISTORY — DX: Personal history of urinary calculi: Z87.442

## 2018-11-24 HISTORY — DX: Gastro-esophageal reflux disease without esophagitis: K21.9

## 2018-11-24 LAB — COMPREHENSIVE METABOLIC PANEL
ALT: 29 U/L (ref 0–44)
AST: 24 U/L (ref 15–41)
Albumin: 3.4 g/dL — ABNORMAL LOW (ref 3.5–5.0)
Alkaline Phosphatase: 41 U/L (ref 38–126)
Anion gap: 8 (ref 5–15)
BUN: 17 mg/dL (ref 6–20)
CO2: 24 mmol/L (ref 22–32)
Calcium: 8.5 mg/dL — ABNORMAL LOW (ref 8.9–10.3)
Chloride: 104 mmol/L (ref 98–111)
Creatinine, Ser: 0.72 mg/dL (ref 0.44–1.00)
GFR calc Af Amer: >60 mL/min (ref >60)
GFR calc non Af Amer: >60 mL/min (ref >60)
Glucose, Bld: 99 mg/dL (ref 70–99)
Potassium: 4.3 mmol/L (ref 3.5–5.1)
Sodium: 136 mmol/L (ref 135–145)
Total Bilirubin: 0.4 mg/dL (ref 0.3–1.2)
Total Protein: 7.4 g/dL (ref 6.5–8.1)

## 2018-11-24 LAB — SARS CORONAVIRUS 2 (TAT 6-24 HRS): SARS Coronavirus 2: NEGATIVE

## 2018-11-24 LAB — PROTIME-INR
INR: 0.9 (ref 0.8–1.2)
Prothrombin Time: 12.4 seconds (ref 11.4–15.2)

## 2018-11-24 LAB — CBC WITH DIFFERENTIAL/PLATELET
Abs Immature Granulocytes: 0.02 10*3/uL (ref 0.00–0.07)
Basophils Absolute: 0.1 10*3/uL (ref 0.0–0.1)
Basophils Relative: 1 %
Eosinophils Absolute: 0.2 10*3/uL (ref 0.0–0.5)
Eosinophils Relative: 3 %
HCT: 34.5 % — ABNORMAL LOW (ref 36.0–46.0)
Hemoglobin: 11.2 g/dL — ABNORMAL LOW (ref 12.0–15.0)
Immature Granulocytes: 0 %
Lymphocytes Relative: 30 %
Lymphs Abs: 2 10*3/uL (ref 0.7–4.0)
MCH: 29.1 pg (ref 26.0–34.0)
MCHC: 32.5 g/dL (ref 30.0–36.0)
MCV: 89.6 fL (ref 80.0–100.0)
Monocytes Absolute: 0.6 10*3/uL (ref 0.1–1.0)
Monocytes Relative: 9 %
Neutro Abs: 3.9 10*3/uL (ref 1.7–7.7)
Neutrophils Relative %: 57 %
Platelets: 214 10*3/uL (ref 150–400)
RBC: 3.85 MIL/uL — ABNORMAL LOW (ref 3.87–5.11)
RDW: 15.4 % (ref 11.5–15.5)
WBC: 6.8 10*3/uL (ref 4.0–10.5)
nRBC: 0 % (ref 0.0–0.2)

## 2018-11-24 LAB — RAPID URINE DRUG SCREEN, HOSP PERFORMED
Amphetamines: NOT DETECTED
Barbiturates: NOT DETECTED
Benzodiazepines: NOT DETECTED
Cocaine: NOT DETECTED
Opiates: NOT DETECTED
Tetrahydrocannabinol: NOT DETECTED

## 2018-11-27 ENCOUNTER — Encounter (HOSPITAL_COMMUNITY)
Admission: RE | Disposition: A | Discharge status: Home / Self Care | Payer: Self-pay | Source: Home / Self Care | Attending: Internal Medicine

## 2018-11-27 ENCOUNTER — Encounter (HOSPITAL_COMMUNITY): Payer: Self-pay | Admitting: Anesthesiology

## 2018-11-27 ENCOUNTER — Other Ambulatory Visit: Payer: Self-pay

## 2018-11-27 ENCOUNTER — Ambulatory Visit (HOSPITAL_COMMUNITY)
Admission: RE | Admit: 2018-11-27 | Discharge: 2018-11-27 | Disposition: A | Discharge status: Home / Self Care | Payer: Medicare HMO | Attending: Internal Medicine | Admitting: Internal Medicine

## 2018-11-27 DIAGNOSIS — J45909 Unspecified asthma, uncomplicated: Secondary | ICD-10-CM | POA: Diagnosis not present

## 2018-11-27 DIAGNOSIS — R69 Illness, unspecified: Secondary | ICD-10-CM | POA: Diagnosis not present

## 2018-11-27 DIAGNOSIS — F1120 Opioid dependence, uncomplicated: Secondary | ICD-10-CM | POA: Diagnosis not present

## 2018-11-27 DIAGNOSIS — I10 Essential (primary) hypertension: Secondary | ICD-10-CM | POA: Diagnosis not present

## 2018-11-27 DIAGNOSIS — Z599 Problem related to housing and economic circumstances, unspecified: Secondary | ICD-10-CM | POA: Diagnosis not present

## 2018-11-27 SURGERY — COLONOSCOPY WITH PROPOFOL
Anesthesia: Monitor Anesthesia Care

## 2018-11-27 NOTE — Progress Notes (Signed)
Patient ate solid food yesterday.  Brought most of a jug of prep with her to take before the procedure.  Follow instructions.  I canceled procedure.  Patient to make contact with the office to regroup to reprep.

## 2018-11-27 NOTE — Progress Notes (Signed)
Patient ate yesterday and was drinking [employer] when she arrived.  Dr Gala Romney canceled procedure and ask her to reschedule.

## 2018-12-04 DIAGNOSIS — F1120 Opioid dependence, uncomplicated: Secondary | ICD-10-CM | POA: Diagnosis not present

## 2018-12-04 DIAGNOSIS — Z599 Problem related to housing and economic circumstances, unspecified: Secondary | ICD-10-CM | POA: Diagnosis not present

## 2018-12-04 DIAGNOSIS — R69 Illness, unspecified: Secondary | ICD-10-CM | POA: Diagnosis not present

## 2018-12-04 DIAGNOSIS — I10 Essential (primary) hypertension: Secondary | ICD-10-CM | POA: Diagnosis not present

## 2018-12-04 DIAGNOSIS — J45909 Unspecified asthma, uncomplicated: Secondary | ICD-10-CM | POA: Diagnosis not present

## 2018-12-11 DIAGNOSIS — F1120 Opioid dependence, uncomplicated: Secondary | ICD-10-CM | POA: Diagnosis not present

## 2018-12-11 DIAGNOSIS — Z599 Problem related to housing and economic circumstances, unspecified: Secondary | ICD-10-CM | POA: Diagnosis not present

## 2018-12-11 DIAGNOSIS — R69 Illness, unspecified: Secondary | ICD-10-CM | POA: Diagnosis not present

## 2018-12-11 DIAGNOSIS — J45909 Unspecified asthma, uncomplicated: Secondary | ICD-10-CM | POA: Diagnosis not present

## 2018-12-11 DIAGNOSIS — I10 Essential (primary) hypertension: Secondary | ICD-10-CM | POA: Diagnosis not present

## 2018-12-18 DIAGNOSIS — Z599 Problem related to housing and economic circumstances, unspecified: Secondary | ICD-10-CM | POA: Diagnosis not present

## 2018-12-18 DIAGNOSIS — J45909 Unspecified asthma, uncomplicated: Secondary | ICD-10-CM | POA: Diagnosis not present

## 2018-12-18 DIAGNOSIS — R69 Illness, unspecified: Secondary | ICD-10-CM | POA: Diagnosis not present

## 2018-12-18 DIAGNOSIS — I10 Essential (primary) hypertension: Secondary | ICD-10-CM | POA: Diagnosis not present

## 2018-12-25 DIAGNOSIS — J45909 Unspecified asthma, uncomplicated: Secondary | ICD-10-CM | POA: Diagnosis not present

## 2018-12-25 DIAGNOSIS — I10 Essential (primary) hypertension: Secondary | ICD-10-CM | POA: Diagnosis not present

## 2018-12-25 DIAGNOSIS — Z599 Problem related to housing and economic circumstances, unspecified: Secondary | ICD-10-CM | POA: Diagnosis not present

## 2018-12-25 DIAGNOSIS — R69 Illness, unspecified: Secondary | ICD-10-CM | POA: Diagnosis not present

## 2018-12-25 DIAGNOSIS — F1120 Opioid dependence, uncomplicated: Secondary | ICD-10-CM | POA: Diagnosis not present

## 2019-01-01 DIAGNOSIS — F1120 Opioid dependence, uncomplicated: Secondary | ICD-10-CM | POA: Diagnosis not present

## 2019-01-01 DIAGNOSIS — Z599 Problem related to housing and economic circumstances, unspecified: Secondary | ICD-10-CM | POA: Diagnosis not present

## 2019-01-01 DIAGNOSIS — R69 Illness, unspecified: Secondary | ICD-10-CM | POA: Diagnosis not present

## 2019-01-01 DIAGNOSIS — I10 Essential (primary) hypertension: Secondary | ICD-10-CM | POA: Diagnosis not present

## 2019-01-01 DIAGNOSIS — J45909 Unspecified asthma, uncomplicated: Secondary | ICD-10-CM | POA: Diagnosis not present

## 2019-01-08 DIAGNOSIS — R69 Illness, unspecified: Secondary | ICD-10-CM | POA: Diagnosis not present

## 2019-01-08 DIAGNOSIS — Z599 Problem related to housing and economic circumstances, unspecified: Secondary | ICD-10-CM | POA: Diagnosis not present

## 2019-01-08 DIAGNOSIS — I10 Essential (primary) hypertension: Secondary | ICD-10-CM | POA: Diagnosis not present

## 2019-01-08 DIAGNOSIS — J45909 Unspecified asthma, uncomplicated: Secondary | ICD-10-CM | POA: Diagnosis not present

## 2019-01-08 DIAGNOSIS — F1120 Opioid dependence, uncomplicated: Secondary | ICD-10-CM | POA: Diagnosis not present

## 2019-01-15 DIAGNOSIS — R69 Illness, unspecified: Secondary | ICD-10-CM | POA: Diagnosis not present

## 2019-01-15 DIAGNOSIS — J45909 Unspecified asthma, uncomplicated: Secondary | ICD-10-CM | POA: Diagnosis not present

## 2019-01-15 DIAGNOSIS — F1120 Opioid dependence, uncomplicated: Secondary | ICD-10-CM | POA: Diagnosis not present

## 2019-01-15 DIAGNOSIS — Z599 Problem related to housing and economic circumstances, unspecified: Secondary | ICD-10-CM | POA: Diagnosis not present

## 2019-01-15 DIAGNOSIS — I10 Essential (primary) hypertension: Secondary | ICD-10-CM | POA: Diagnosis not present

## 2019-01-22 DIAGNOSIS — I10 Essential (primary) hypertension: Secondary | ICD-10-CM | POA: Diagnosis not present

## 2019-01-22 DIAGNOSIS — Z599 Problem related to housing and economic circumstances, unspecified: Secondary | ICD-10-CM | POA: Diagnosis not present

## 2019-01-22 DIAGNOSIS — R69 Illness, unspecified: Secondary | ICD-10-CM | POA: Diagnosis not present

## 2019-01-22 DIAGNOSIS — F1120 Opioid dependence, uncomplicated: Secondary | ICD-10-CM | POA: Diagnosis not present

## 2019-01-22 DIAGNOSIS — J45909 Unspecified asthma, uncomplicated: Secondary | ICD-10-CM | POA: Diagnosis not present

## 2019-01-29 DIAGNOSIS — I10 Essential (primary) hypertension: Secondary | ICD-10-CM | POA: Diagnosis not present

## 2019-01-29 DIAGNOSIS — F1120 Opioid dependence, uncomplicated: Secondary | ICD-10-CM | POA: Diagnosis not present

## 2019-01-29 DIAGNOSIS — J45909 Unspecified asthma, uncomplicated: Secondary | ICD-10-CM | POA: Diagnosis not present

## 2019-01-29 DIAGNOSIS — Z599 Problem related to housing and economic circumstances, unspecified: Secondary | ICD-10-CM | POA: Diagnosis not present

## 2019-01-29 DIAGNOSIS — R69 Illness, unspecified: Secondary | ICD-10-CM | POA: Diagnosis not present

## 2019-01-30 DIAGNOSIS — F1120 Opioid dependence, uncomplicated: Secondary | ICD-10-CM | POA: Diagnosis not present

## 2019-01-30 DIAGNOSIS — F4320 Adjustment disorder, unspecified: Secondary | ICD-10-CM | POA: Diagnosis not present

## 2019-01-30 DIAGNOSIS — F329 Major depressive disorder, single episode, unspecified: Secondary | ICD-10-CM | POA: Diagnosis not present

## 2019-01-30 DIAGNOSIS — F319 Bipolar disorder, unspecified: Secondary | ICD-10-CM | POA: Diagnosis not present

## 2019-01-30 DIAGNOSIS — R69 Illness, unspecified: Secondary | ICD-10-CM | POA: Diagnosis not present

## 2019-01-30 DIAGNOSIS — J45909 Unspecified asthma, uncomplicated: Secondary | ICD-10-CM | POA: Diagnosis not present

## 2019-01-30 DIAGNOSIS — I10 Essential (primary) hypertension: Secondary | ICD-10-CM | POA: Diagnosis not present

## 2019-01-30 DIAGNOSIS — Z599 Problem related to housing and economic circumstances, unspecified: Secondary | ICD-10-CM | POA: Diagnosis not present

## 2019-02-05 DIAGNOSIS — J45909 Unspecified asthma, uncomplicated: Secondary | ICD-10-CM | POA: Diagnosis not present

## 2019-02-05 DIAGNOSIS — F1120 Opioid dependence, uncomplicated: Secondary | ICD-10-CM | POA: Diagnosis not present

## 2019-02-05 DIAGNOSIS — Z599 Problem related to housing and economic circumstances, unspecified: Secondary | ICD-10-CM | POA: Diagnosis not present

## 2019-02-05 DIAGNOSIS — R69 Illness, unspecified: Secondary | ICD-10-CM | POA: Diagnosis not present

## 2019-02-05 DIAGNOSIS — I10 Essential (primary) hypertension: Secondary | ICD-10-CM | POA: Diagnosis not present

## 2019-02-07 ENCOUNTER — Other Ambulatory Visit: Payer: Self-pay | Admitting: Gastroenterology

## 2019-02-07 DIAGNOSIS — K219 Gastro-esophageal reflux disease without esophagitis: Secondary | ICD-10-CM

## 2019-02-12 DIAGNOSIS — Z599 Problem related to housing and economic circumstances, unspecified: Secondary | ICD-10-CM | POA: Diagnosis not present

## 2019-02-12 DIAGNOSIS — F1120 Opioid dependence, uncomplicated: Secondary | ICD-10-CM | POA: Diagnosis not present

## 2019-02-12 DIAGNOSIS — R69 Illness, unspecified: Secondary | ICD-10-CM | POA: Diagnosis not present

## 2019-02-12 DIAGNOSIS — J45909 Unspecified asthma, uncomplicated: Secondary | ICD-10-CM | POA: Diagnosis not present

## 2019-02-12 DIAGNOSIS — I10 Essential (primary) hypertension: Secondary | ICD-10-CM | POA: Diagnosis not present

## 2019-02-15 DIAGNOSIS — R69 Illness, unspecified: Secondary | ICD-10-CM | POA: Diagnosis not present

## 2019-02-15 DIAGNOSIS — Z599 Problem related to housing and economic circumstances, unspecified: Secondary | ICD-10-CM | POA: Diagnosis not present

## 2019-02-15 DIAGNOSIS — J45909 Unspecified asthma, uncomplicated: Secondary | ICD-10-CM | POA: Diagnosis not present

## 2019-02-15 DIAGNOSIS — I10 Essential (primary) hypertension: Secondary | ICD-10-CM | POA: Diagnosis not present

## 2019-02-15 DIAGNOSIS — F1120 Opioid dependence, uncomplicated: Secondary | ICD-10-CM | POA: Diagnosis not present

## 2019-02-19 DIAGNOSIS — Z599 Problem related to housing and economic circumstances, unspecified: Secondary | ICD-10-CM | POA: Diagnosis not present

## 2019-02-19 DIAGNOSIS — I10 Essential (primary) hypertension: Secondary | ICD-10-CM | POA: Diagnosis not present

## 2019-02-19 DIAGNOSIS — F1120 Opioid dependence, uncomplicated: Secondary | ICD-10-CM | POA: Diagnosis not present

## 2019-02-19 DIAGNOSIS — R69 Illness, unspecified: Secondary | ICD-10-CM | POA: Diagnosis not present

## 2019-02-19 DIAGNOSIS — J45909 Unspecified asthma, uncomplicated: Secondary | ICD-10-CM | POA: Diagnosis not present

## 2019-02-22 ENCOUNTER — Ambulatory Visit: Payer: Medicare HMO | Admitting: Gastroenterology

## 2019-02-26 DIAGNOSIS — R69 Illness, unspecified: Secondary | ICD-10-CM | POA: Diagnosis not present

## 2019-02-26 DIAGNOSIS — Z599 Problem related to housing and economic circumstances, unspecified: Secondary | ICD-10-CM | POA: Diagnosis not present

## 2019-02-26 DIAGNOSIS — I10 Essential (primary) hypertension: Secondary | ICD-10-CM | POA: Diagnosis not present

## 2019-02-26 DIAGNOSIS — J45909 Unspecified asthma, uncomplicated: Secondary | ICD-10-CM | POA: Diagnosis not present

## 2019-02-26 DIAGNOSIS — F1120 Opioid dependence, uncomplicated: Secondary | ICD-10-CM | POA: Diagnosis not present

## 2019-03-04 NOTE — Progress Notes (Deleted)
Referring Provider: No ref. provider found Primary Care Physician:  Patient, [name] Primary GI Physician: Dr. Marland Kitchen  No chief complaint on file.   HPI:   [name] is a 48 y.o. female with past medical history of polysubstance abuse currently in recovery and on Suboxone, COPD, and hepatitis C genotype 1a diagnosed in 2016 without treatment.  She was seen in our office on 11/17/2018 for initial consult for abdominal pain at the request of [facility] emergency department. On 6/30, C/T abdomen and pelvis with contrast with collapsed appearance versus mild wall thickening/colitis of sigmoid colon. Also noted thickening of endometrial complex. Patient recommended to have f/u pelvis US and follow-up with GI.  She did not have pelvic ultrasound completed.  On 7/27, CT renal study with heterogeneous appearance of the left uterine fundus. Degenerating uterine fibroid is a consideration. No colonic wall thickening or inflammatory changes. Patient recommended to follow-up with OBGYN which she had not done at the time of our visit.  She reported lower abdominal pain x1 year that started out suprapubic but now comes up across her lower abdomen towards her left back.  Intermittent daily lasting 1 hour at a time. Denied association with meals or bowel habits.  She did admit to constipation for the last 3 years.  Denied bright red blood per rectum, melena, urinary symptoms, vaginal discharge, or unintentional weight loss.  She did have intermittent nausea and GERD symptoms 2-3 times a week. Taking 1600 mg ibuprofen 3-4 times a day.  Her abdominal exam was benign.  There was higher suspicion for GYN etiology of her lower abdominal pain with possible influence of underlying constipation; however, plans for TCS to evaluate bowel wall thickening on prior CT and reported family history of colon cancer.  She was reminded to return to [facility] to have pelvic US completed, started on Amitiza 24 mcg twice daily for  constipation, Protonix 40 mg daily for GERD and suspected NSAID induced gastritis, esophagitis, or duodenitis, and advised to decrease ibuprofen use.  Would likely pursue hep C treatment in the near future pending better control of her other GI symptoms.  She reported eating a lot of salt and had lower extremity edema on exam which she stated was intermittent over the last 5 years.  Prior CT in June 2020 without evidence of cirrhosis.  Most suspected LE edema secondary to obesity and high salt intake.  Less likely decompensated cirrhosis with out cirrhosis on prior imaging.  Colonoscopy was scheduled for 11/27/2018.  Preop labs on 11/24/2018 with negative UDS, LFTs within normal limits, bilirubin normal, INR normal, kidney function, sodium, and potassium normal, platelets normal, and hemoglobin low at 11.2 which was down from 15 in July 2020.   Patient's colonoscopy was canceled as she ate solid food the day before her procedure and brought most of a jug of prep with her the morning of her procedure to complete prior to TCS.   Patient canceled her last appointment on 02/22/2019.  Today she states  May need EGD with TCS.   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   GERD (gastroesophageal reflux disease)    Hepatitis C infection 07/13/2014   History of kidney stones    IV drug abuse ([place])  Nephrolithiasis    Panic attacks    Polysubstance abuse (HCC)    cocaine, opiates, marijuana, amphetamines, "molly," crystal meth   Pulmonary embolism ([place])    Septic arthritis of right sternoclavicular joint ([place]) 02/21/2016    Past Surgical History:  Procedure Laterality Date   APPLICATION OF A-CELL OF CHEST/ABDOMEN Right 09/07/2016   Procedure: APPLICATION OF A-CELL OF CHEST/ABDOMEN;  Surgeon:  Wallace Going, DO;  Location: [place];  Service: Plastics;  Laterality: Right;   CESAREAN SECTION     I&D EXTREMITY Right 09/02/2016   Procedure: IRRIGATION AND DEBRIDEMENT OF RIGHT STERNOCLAVICULAR JOINT, STERNUM AND RIBS;  Surgeon: Gaye Pollack, MD;  Location: [place] [facility];  Service: Vascular;  Laterality: Right;   INCISION AND DRAINAGE OF WOUND Right 09/07/2016   Procedure: IRRIGATION AND DEBRIDEMENT OF CHEST WOUND, PLACEMENT OF A CELL AND VAC;  Surgeon: Wallace Going, DO;  Location: [place];  Service: Plastics;  Laterality: Right;   NOSE SURGERY     STERNAL WOUND DEBRIDEMENT N/A 02/22/2016   Procedure: IRRIGATION AND DEBRIDEMENT MANUBRIUM ABSCESS with wound cultures;  Surgeon: Melrose Nakayama, MD;  Location: [place];  Service: Thoracic;  Laterality: N/A;   STERNAL WOUND DEBRIDEMENT N/A 02/23/2016   Procedure: DRESSING CHANGE UNDER ANESTHESIA  WITH WOUND VAC PLACEMENT;  Surgeon: Melrose Nakayama, MD;  Location: [place];  Service: Thoracic;  Laterality: N/A;   TEE WITHOUT CARDIOVERSION N/A 02/23/2016   Procedure: TRANSESOPHAGEAL ECHOCARDIOGRAM (TEE);  Surgeon: Jerline Pain, MD;  Location[facility] ENDOSCOPY;  Service: Cardiovascular;  Laterality: N/A;   TUBAL LIGATION     "tied, burned, clipped"    Current Outpatient Medications  Medication Sig Dispense Refill   acetaminophen (TYLENOL) 500 MG tablet Take 1,000 mg by mouth 3 (three) times daily as needed for moderate pain.      Buprenorphine HCl-Naloxone HCl (SUBOXONE) 8-2 MG FILM Place 1 Film under the tongue 2 (two) times a day.     ibuprofen (ADVIL) 200 MG tablet Take 400 mg by mouth 2 (two) times daily as needed for moderate pain.      LATUDA 40 MG TABS tablet Take 40 mg by mouth daily.     lubiprostone (AMITIZA) 24 MCG capsule Take 1 capsule (24 mcg total) by mouth 2 (two) times daily with a meal. 60 capsule 5   pantoprazole (PROTONIX) 40 MG tablet TAKE 1 TABLET BY MOUTH EVERY DAY 90 tablet 3   No current  facility-administered medications for this visit.     Allergies as of 03/05/2019 - Review Complete 11/27/2018  Allergen Reaction Noted   Mango flavor Hives and Swelling 10/01/2014   Pyridium [phenazopyridine hcl] Other (See Comments) 09/09/2012    Family History  Problem Relation Age of Onset   COPD Mother    Hypertension Father    Heart attack Father    Diabetes Father    Stomach cancer Father        metastasized to liver   Arthritis Other    Lung disease Other    Cancer Other    Asthma Other    Colon cancer Paternal Grandfather 22   Colon cancer Paternal Uncle 89    Social History   Socioeconomic History   Marital status: Divorced    Spouse name: Not on file   Number of children: Not on file   Years of education: 9th grade   Highest education level: Not on file  Occupational History    Employer: UNEMPLOYED  Social Needs   Financial  resource strain: Not on file   Food insecurity    Worry: Not on file    Inability: Not on file   Transportation needs    Medical: Not on file    Non-medical: Not on file  Tobacco Use   Smoking status: Current Every Day Smoker    Packs/day: 1.00    Years: 32.00    Pack years: 32.00    Types: Cigarettes   Smokeless tobacco: Never Used  Substance and Sexual Activity   Alcohol use: Not Currently    Alcohol/week: 0.0 standard drinks    Comment: quit 6 years ago   Drug use: Not Currently    Types: IV, Cocaine, Marijuana, Methamphetamines, Heroin, Other-see comments    Comment: h/o Crystal meth, "molly," narcotics  clean 18 months   Sexual activity: Yes    Birth control/protection: Surgical  Lifestyle   Physical activity    Days per week: Not on file    Minutes per session: Not on file   Stress: Not on file  Relationships   Social connections    Talks on phone: Not on file    Gets together: Not on file    Attends religious service: Not on file    Active member of club or organization: Not on file      Attends meetings of clubs or organizations: Not on file    Relationship status: Not on file  Other Topics Concern   Not on file  Social History Narrative   Not on file    Review of Systems: Gen: Denies fever, chills, anorexia. Denies fatigue, weakness, weight loss.  CV: Denies chest pain, palpitations, syncope, peripheral edema, and claudication. Resp: Denies dyspnea at rest, cough, wheezing, coughing up blood, and pleurisy. GI: Denies vomiting blood, jaundice, and fecal incontinence.   Denies dysphagia or odynophagia. Derm: Denies rash, itching, dry skin Psych: Denies depression, anxiety, memory loss, confusion. No homicidal or suicidal ideation.  Heme: Denies bruising, bleeding, and enlarged lymph nodes.  Physical Exam: LMP 07/01/2016  General:   Alert and oriented. No distress noted. Pleasant and cooperative.  Head:  Normocephalic and atraumatic. Eyes:  Conjuctiva clear without scleral icterus. Mouth:  Oral mucosa pink and moist. Good dentition. No lesions. Heart:  S1, S2 present without murmurs appreciated. Lungs:  Clear to auscultation bilaterally. No wheezes, rales, or rhonchi. No distress.  Abdomen:  +BS, soft, non-tender and non-distended. No rebound or guarding. No HSM or masses noted. Msk:  Symmetrical without gross deformities. Normal posture. Extremities:  Without edema. Neurologic:  Alert and  oriented x4 Psych:  Alert and cooperative. Normal mood and affect.

## 2019-03-05 ENCOUNTER — Encounter: Payer: Self-pay | Admitting: Internal Medicine

## 2019-03-05 ENCOUNTER — Ambulatory Visit: Payer: Medicare HMO | Admitting: Gastroenterology

## 2019-03-05 ENCOUNTER — Telehealth: Payer: Self-pay | Admitting: Internal Medicine

## 2019-03-05 DIAGNOSIS — F1120 Opioid dependence, uncomplicated: Secondary | ICD-10-CM | POA: Diagnosis not present

## 2019-03-05 DIAGNOSIS — R69 Illness, unspecified: Secondary | ICD-10-CM | POA: Diagnosis not present

## 2019-03-05 DIAGNOSIS — Z599 Problem related to housing and economic circumstances, unspecified: Secondary | ICD-10-CM | POA: Diagnosis not present

## 2019-03-05 DIAGNOSIS — I10 Essential (primary) hypertension: Secondary | ICD-10-CM | POA: Diagnosis not present

## 2019-03-05 DIAGNOSIS — J45909 Unspecified asthma, uncomplicated: Secondary | ICD-10-CM | POA: Diagnosis not present

## 2019-03-05 NOTE — Telephone Encounter (Signed)
PATIENT WAS A NO SHOW AND LETTER SENT  °

## 2019-03-05 NOTE — Telephone Encounter (Signed)
Noted  

## 2019-03-12 DIAGNOSIS — R69 Illness, unspecified: Secondary | ICD-10-CM | POA: Diagnosis not present

## 2019-03-12 DIAGNOSIS — I10 Essential (primary) hypertension: Secondary | ICD-10-CM | POA: Diagnosis not present

## 2019-03-12 DIAGNOSIS — J45909 Unspecified asthma, uncomplicated: Secondary | ICD-10-CM | POA: Diagnosis not present

## 2019-03-12 DIAGNOSIS — F1120 Opioid dependence, uncomplicated: Secondary | ICD-10-CM | POA: Diagnosis not present

## 2019-03-12 DIAGNOSIS — Z599 Problem related to housing and economic circumstances, unspecified: Secondary | ICD-10-CM | POA: Diagnosis not present

## 2019-03-19 DIAGNOSIS — F1120 Opioid dependence, uncomplicated: Secondary | ICD-10-CM | POA: Diagnosis not present

## 2019-03-19 DIAGNOSIS — Z599 Problem related to housing and economic circumstances, unspecified: Secondary | ICD-10-CM | POA: Diagnosis not present

## 2019-03-19 DIAGNOSIS — R69 Illness, unspecified: Secondary | ICD-10-CM | POA: Diagnosis not present

## 2019-03-19 DIAGNOSIS — I10 Essential (primary) hypertension: Secondary | ICD-10-CM | POA: Diagnosis not present

## 2019-03-19 DIAGNOSIS — J45909 Unspecified asthma, uncomplicated: Secondary | ICD-10-CM | POA: Diagnosis not present

## 2019-03-26 DIAGNOSIS — Z599 Problem related to housing and economic circumstances, unspecified: Secondary | ICD-10-CM | POA: Diagnosis not present

## 2019-03-26 DIAGNOSIS — R69 Illness, unspecified: Secondary | ICD-10-CM | POA: Diagnosis not present

## 2019-03-26 DIAGNOSIS — J45909 Unspecified asthma, uncomplicated: Secondary | ICD-10-CM | POA: Diagnosis not present

## 2019-03-26 DIAGNOSIS — I10 Essential (primary) hypertension: Secondary | ICD-10-CM | POA: Diagnosis not present

## 2019-03-26 DIAGNOSIS — F1120 Opioid dependence, uncomplicated: Secondary | ICD-10-CM | POA: Diagnosis not present

## 2019-04-02 DIAGNOSIS — J45909 Unspecified asthma, uncomplicated: Secondary | ICD-10-CM | POA: Diagnosis not present

## 2019-04-02 DIAGNOSIS — Z599 Problem related to housing and economic circumstances, unspecified: Secondary | ICD-10-CM | POA: Diagnosis not present

## 2019-04-02 DIAGNOSIS — I10 Essential (primary) hypertension: Secondary | ICD-10-CM | POA: Diagnosis not present

## 2019-04-02 DIAGNOSIS — R69 Illness, unspecified: Secondary | ICD-10-CM | POA: Diagnosis not present

## 2019-04-02 DIAGNOSIS — F1120 Opioid dependence, uncomplicated: Secondary | ICD-10-CM | POA: Diagnosis not present

## 2019-04-09 DIAGNOSIS — F1120 Opioid dependence, uncomplicated: Secondary | ICD-10-CM | POA: Diagnosis not present

## 2019-04-09 DIAGNOSIS — Z599 Problem related to housing and economic circumstances, unspecified: Secondary | ICD-10-CM | POA: Diagnosis not present

## 2019-04-09 DIAGNOSIS — I10 Essential (primary) hypertension: Secondary | ICD-10-CM | POA: Diagnosis not present

## 2019-04-09 DIAGNOSIS — R69 Illness, unspecified: Secondary | ICD-10-CM | POA: Diagnosis not present

## 2019-04-09 DIAGNOSIS — J45909 Unspecified asthma, uncomplicated: Secondary | ICD-10-CM | POA: Diagnosis not present

## 2019-05-06 ENCOUNTER — Other Ambulatory Visit: Payer: Self-pay | Admitting: Gastroenterology

## 2019-05-06 DIAGNOSIS — K5900 Constipation, unspecified: Secondary | ICD-10-CM

## 2019-06-22 ENCOUNTER — Telehealth: Payer: Self-pay | Admitting: Adult Health

## 2019-06-22 ENCOUNTER — Encounter: Payer: Medicare HMO | Admitting: Adult Health

## 2019-06-22 NOTE — Telephone Encounter (Signed)

## 2019-06-25 ENCOUNTER — Ambulatory Visit (INDEPENDENT_AMBULATORY_CARE_PROVIDER_SITE_OTHER): Payer: Medicare HMO | Admitting: Adult Health

## 2019-06-25 ENCOUNTER — Other Ambulatory Visit: Payer: Self-pay

## 2019-06-25 ENCOUNTER — Encounter: Payer: Self-pay | Admitting: Adult Health

## 2019-06-25 ENCOUNTER — Other Ambulatory Visit (HOSPITAL_COMMUNITY)
Admission: RE | Admit: 2019-06-25 | Discharge: 2019-06-25 | Disposition: A | Discharge status: Home / Self Care | Payer: Medicare HMO | Source: Ambulatory Visit | Attending: Adult Health | Admitting: Adult Health

## 2019-06-25 VITALS — BP 124/74 | HR 71 | Ht 61.5 in | Wt 242.2 lb

## 2019-06-25 DIAGNOSIS — Z1211 Encounter for screening for malignant neoplasm of colon: Secondary | ICD-10-CM | POA: Insufficient documentation

## 2019-06-25 DIAGNOSIS — Z124 Encounter for screening for malignant neoplasm of cervix: Secondary | ICD-10-CM

## 2019-06-25 DIAGNOSIS — Z1151 Encounter for screening for human papillomavirus (HPV): Secondary | ICD-10-CM | POA: Insufficient documentation

## 2019-06-25 DIAGNOSIS — R102 Pelvic and perineal pain: Secondary | ICD-10-CM

## 2019-06-25 DIAGNOSIS — Z780 Asymptomatic menopausal state: Secondary | ICD-10-CM | POA: Insufficient documentation

## 2019-06-25 DIAGNOSIS — Z1212 Encounter for screening for malignant neoplasm of rectum: Secondary | ICD-10-CM
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
## 2019-06-26 ENCOUNTER — Encounter: Payer: Self-pay | Admitting: Internal Medicine

## 2019-06-27 LAB — CYTOLOGY - PAP
Chlamydia: NEGATIVE
Comment: NEGATIVE
Comment: NEGATIVE
Comment: NORMAL
Diagnosis: NEGATIVE
High risk HPV: NEGATIVE
Neisseria Gonorrhea: NEGATIVE

## 2019-07-09 ENCOUNTER — Ambulatory Visit: Payer: Medicare HMO | Admitting: Adult Health

## 2019-07-09 ENCOUNTER — Other Ambulatory Visit: Payer: Medicare HMO

## 2019-07-16 ENCOUNTER — Other Ambulatory Visit: Payer: Medicare HMO

## 2019-07-16 ENCOUNTER — Ambulatory Visit: Payer: Medicare HMO | Admitting: Adult Health

## 2019-07-26 ENCOUNTER — Other Ambulatory Visit: Payer: Self-pay | Admitting: Nurse Practitioner

## 2019-08-09 ENCOUNTER — Ambulatory Visit: Payer: Medicare HMO | Admitting: Gastroenterology
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
## 2019-08-27 ENCOUNTER — Encounter: Payer: Self-pay | Admitting: Internal Medicine

## 2019-08-27 ENCOUNTER — Ambulatory Visit: Payer: Medicare HMO | Admitting: Gastroenterology

## 2019-11-26 NOTE — Telephone Encounter (Signed)
Opened in Error.

## 2019-12-08 ENCOUNTER — Other Ambulatory Visit: Payer: Self-pay

## 2019-12-08 ENCOUNTER — Encounter (HOSPITAL_COMMUNITY): Payer: Self-pay | Admitting: Emergency Medicine

## 2019-12-08 ENCOUNTER — Emergency Department (HOSPITAL_COMMUNITY): Payer: Medicare HMO

## 2019-12-08 DIAGNOSIS — J441 Chronic obstructive pulmonary disease with (acute) exacerbation: Secondary | ICD-10-CM | POA: Diagnosis not present

## 2019-12-08 DIAGNOSIS — R0602 Shortness of breath: Secondary | ICD-10-CM | POA: Diagnosis present

## 2019-12-08 DIAGNOSIS — F17210 Nicotine dependence, cigarettes, uncomplicated: Secondary | ICD-10-CM | POA: Diagnosis not present

## 2019-12-08 DIAGNOSIS — Z20822 Contact with and (suspected) exposure to covid-19: Secondary | ICD-10-CM | POA: Insufficient documentation

## 2019-12-08 DIAGNOSIS — R2242 Localized swelling, mass and lump, left lower limb: Secondary | ICD-10-CM | POA: Diagnosis not present

## 2019-12-08 DIAGNOSIS — Z79899 Other long term (current) drug therapy: Secondary | ICD-10-CM | POA: Insufficient documentation

## 2019-12-08 DIAGNOSIS — B974 Respiratory syncytial virus as the cause of diseases classified elsewhere: Secondary | ICD-10-CM | POA: Insufficient documentation

## 2019-12-08 LAB — URINALYSIS, ROUTINE W REFLEX MICROSCOPIC
Bacteria, UA: NONE SEEN
Bilirubin Urine: NEGATIVE
Glucose, UA: NEGATIVE mg/dL
Hgb urine dipstick: NEGATIVE
Ketones, ur: NEGATIVE mg/dL
Nitrite: NEGATIVE
Protein, ur: NEGATIVE mg/dL
Specific Gravity, Urine: 1.006 (ref 1.005–1.030)
pH: 6 (ref 5.0–8.0)

## 2019-12-08 NOTE — ED Triage Notes (Signed)
Pt is here [facility] for shortness of breath, intermittent chest pain, bilateral lower leg edema. EMS reports lung sounds are clear, 12 lead is unremarkable. Pt's grandchild was Dx with RSV and she was around him.

## 2019-12-09 ENCOUNTER — Emergency Department (HOSPITAL_COMMUNITY): Payer: Medicare HMO

## 2019-12-09 ENCOUNTER — Emergency Department (HOSPITAL_COMMUNITY)
Admission: EM | Admit: 2019-12-09 | Discharge: 2019-12-09 | Disposition: A | Discharge status: Home / Self Care | Payer: Medicare HMO | Attending: Emergency Medicine | Admitting: Emergency Medicine

## 2019-12-09 DIAGNOSIS — M7989 Other specified soft tissue disorders: Secondary | ICD-10-CM

## 2019-12-09 DIAGNOSIS — J441 Chronic obstructive pulmonary disease with (acute) exacerbation: Secondary | ICD-10-CM

## 2019-12-09 DIAGNOSIS — B338 Other specified viral diseases: Secondary | ICD-10-CM

## 2019-12-09 DIAGNOSIS — B974 Respiratory syncytial virus as the cause of diseases classified elsewhere: Secondary | ICD-10-CM

## 2019-12-09 LAB — RESP PANEL BY RT PCR (RSV, FLU A&B, COVID)
Influenza A by PCR: NEGATIVE
Influenza B by PCR: NEGATIVE
Respiratory Syncytial Virus by PCR: POSITIVE — AB
SARS Coronavirus 2 by RT PCR: NEGATIVE

## 2019-12-09 LAB — CBC WITH DIFFERENTIAL/PLATELET
Abs Immature Granulocytes: 0.05 10*3/uL (ref 0.00–0.07)
Basophils Absolute: 0.1 10*3/uL (ref 0.0–0.1)
Basophils Relative: 1 %
Eosinophils Absolute: 0.1 10*3/uL (ref 0.0–0.5)
Eosinophils Relative: 1 %
HCT: 37.9 % (ref 36.0–46.0)
Hemoglobin: 12.2 g/dL (ref 12.0–15.0)
Immature Granulocytes: 1 %
Lymphocytes Relative: 24 %
Lymphs Abs: 1.7 10*3/uL (ref 0.7–4.0)
MCH: 28.4 pg (ref 26.0–34.0)
MCHC: 32.2 g/dL (ref 30.0–36.0)
MCV: 88.1 fL (ref 80.0–100.0)
Monocytes Absolute: 0.8 10*3/uL (ref 0.1–1.0)
Monocytes Relative: 12 %
Neutro Abs: 4.4 10*3/uL (ref 1.7–7.7)
Neutrophils Relative %: 61 %
Platelets: 233 10*3/uL (ref 150–400)
RBC: 4.3 MIL/uL (ref 3.87–5.11)
RDW: 14.6 % (ref 11.5–15.5)
WBC: 7.1 10*3/uL (ref 4.0–10.5)
nRBC: 0 % (ref 0.0–0.2)

## 2019-12-09 LAB — BASIC METABOLIC PANEL
Anion gap: 8 (ref 5–15)
BUN: 8 mg/dL (ref 6–20)
CO2: 25 mmol/L (ref 22–32)
Calcium: 8.5 mg/dL — ABNORMAL LOW (ref 8.9–10.3)
Chloride: 103 mmol/L (ref 98–111)
Creatinine, Ser: 0.74 mg/dL (ref 0.44–1.00)
GFR calc Af Amer: >60 mL/min (ref >60)
GFR calc non Af Amer: >60 mL/min (ref >60)
Glucose, Bld: 95 mg/dL (ref 70–99)
Potassium: 3.6 mmol/L (ref 3.5–5.1)
Sodium: 136 mmol/L (ref 135–145)

## 2019-12-09 LAB — BRAIN NATRIURETIC PEPTIDE: B Natriuretic Peptide: 43 pg/mL (ref 0.0–100.0)

## 2019-12-09 LAB — D-DIMER, QUANTITATIVE: D-Dimer, Quant: 0.89 ug/mL-FEU — ABNORMAL HIGH (ref 0.00–0.50)

## 2019-12-09 LAB — TROPONIN I (HIGH SENSITIVITY): Troponin I (High Sensitivity): 3 ng/L (ref <18)

## 2019-12-09 MED ORDER — PREDNISONE 50 MG PO TABS: ORAL_TABLET | ORAL | 0 refills | Status: DC

## 2019-12-09 MED ORDER — ALBUTEROL SULFATE HFA 108 (90 BASE) MCG/ACT IN AERS
2.0000 | INHALATION_SPRAY | Freq: Once | RESPIRATORY_TRACT | Status: AC
  Administered 2019-12-09: 2 via RESPIRATORY_TRACT
  Filled 2019-12-09: qty 6.7

## 2019-12-09 MED ORDER — ALBUTEROL SULFATE HFA 108 (90 BASE) MCG/ACT IN AERS: 2.0000 | INHALATION_SPRAY | RESPIRATORY_TRACT | 0 refills | Status: AC | PRN

## 2019-12-09 MED ORDER — METHYLPREDNISOLONE SODIUM SUCC 125 MG IJ SOLR
80.0000 mg | Freq: Once | INTRAMUSCULAR | Status: AC
  Administered 2019-12-09: 80 mg via INTRAVENOUS
  Filled 2019-12-09: qty 2

## 2019-12-09 MED ORDER — IOHEXOL 350 MG/ML SOLN
100.0000 mL | Freq: Once | INTRAVENOUS | Status: AC | PRN
  Administered 2019-12-09: 100 mL via INTRAVENOUS

## 2019-12-09 MED ORDER — IOHEXOL 350 MG/ML SOLN: 100.0000 mL | Freq: Once | INTRAVENOUS | Status: DC | PRN
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
## 2019-12-09 NOTE — ED Notes (Signed)
Pt O2 stayed at 93% while ambulating on room air

## 2019-12-09 NOTE — Discharge Instructions (Addendum)
You have an RSV infection which is treated like, cough and cold virus.  Take the steroids as prescribed.  You may use the albuterol inhaler every 4 hours as needed.  There is no evidence of heart attack or blood clot in the lung.  You should follow-up for a ultrasound of your leg to rule out blood clot.  Return to the ED with exertional chest pain, shortness of breath, or any other concerns.

## 2019-12-09 NOTE — ED Provider Notes (Signed)
  Physical Exam  BP (!) 147/80 (BP Location: Right Wrist)   Pulse 71   Temp 97.9 F (36.6 C) (Oral)   Resp 18   Ht 5\' 1"  (1.549 m)   Wt 108.9 kg   LMP 07/01/2016   SpO2 93%   BMI 45.35 kg/m   Physical Exam  ED Course/Procedures     Procedures  MDM  Patient presents with chest pain.  Shortness of breath.  Found to have RSV.  Not hypoxic.  CT scan done due to elevated D-dimer and swelling in her leg.  Scan did not show pulmonary embolism.  Lungs actually reassuring.  Will discharge home with symptomatic treatment. Patient has not had her Covid vaccine.  States she is not going to get it because when she had Covid she almost died.       Davonna Belling, MD 12/09/19 [phone]

## 2019-12-14 LAB — CULTURE, BLOOD (ROUTINE X 2)
Culture: NO GROWTH
Culture: NO GROWTH
Special Requests: ADEQUATE
Special Requests: ADEQUATE

## 2020-03-26 ENCOUNTER — Encounter: Payer: Self-pay | Admitting: Internal Medicine

## 2020-03-26 ENCOUNTER — Telehealth: Payer: Self-pay | Admitting: Internal Medicine

## 2020-03-26 NOTE — Telephone Encounter (Signed)
Pt aware of OV ?

## 2020-05-13 ENCOUNTER — Telehealth: Payer: Self-pay | Admitting: Internal Medicine

## 2020-05-13 ENCOUNTER — Ambulatory Visit: Payer: Medicare HMO | Admitting: Internal Medicine

## 2020-05-13 NOTE — Telephone Encounter (Signed)
Noted  

## 2020-05-13 NOTE — Telephone Encounter (Signed)
Patient was aware of appointment and was a no show x3

## 2020-07-08 ENCOUNTER — Encounter: Payer: Self-pay | Admitting: Internal Medicine

## 2020-08-18 ENCOUNTER — Encounter: Payer: Self-pay | Admitting: Internal Medicine

## 2020-12-09 ENCOUNTER — Ambulatory Visit: Payer: Medicare HMO | Admitting: Gastroenterology

## 2021-02-11 ENCOUNTER — Other Ambulatory Visit: Payer: Self-pay | Admitting: Family Medicine

## 2021-02-11 DIAGNOSIS — Z780 Asymptomatic menopausal state: Secondary | ICD-10-CM

## 2021-02-11 DIAGNOSIS — Z1231 Encounter for screening mammogram for malignant neoplasm of breast: Secondary | ICD-10-CM

## 2021-02-18 ENCOUNTER — Encounter: Payer: Self-pay | Admitting: Internal Medicine

## 2021-03-31 ENCOUNTER — Encounter: Payer: Self-pay | Admitting: Internal Medicine

## 2021-03-31 ENCOUNTER — Ambulatory Visit: Payer: Medicare HMO | Admitting: Gastroenterology

## 2021-04-14 ENCOUNTER — Ambulatory Visit: Payer: Medicare HMO | Admitting: Gastroenterology

## 2021-11-05 ENCOUNTER — Other Ambulatory Visit: Payer: Self-pay

## 2021-11-05 ENCOUNTER — Emergency Department (HOSPITAL_COMMUNITY): Payer: Medicare Other

## 2021-11-05 ENCOUNTER — Inpatient Hospital Stay (HOSPITAL_COMMUNITY)
Admission: EM | Admit: 2021-11-05 | Discharge: 2021-11-07 | DRG: 690 | Disposition: A | Discharge status: Home / Self Care | Payer: Medicare Other | Attending: Internal Medicine | Admitting: Internal Medicine

## 2021-11-05 ENCOUNTER — Encounter (HOSPITAL_COMMUNITY): Payer: Self-pay | Admitting: Emergency Medicine

## 2021-11-05 DIAGNOSIS — F419 Anxiety disorder, unspecified: Secondary | ICD-10-CM | POA: Diagnosis present

## 2021-11-05 DIAGNOSIS — Z6841 Body Mass Index (BMI) 40.0 and over, adult: Secondary | ICD-10-CM

## 2021-11-05 DIAGNOSIS — F1120 Opioid dependence, uncomplicated: Secondary | ICD-10-CM | POA: Diagnosis present

## 2021-11-05 DIAGNOSIS — Z7985 Long-term (current) use of injectable non-insulin antidiabetic drugs: Secondary | ICD-10-CM

## 2021-11-05 DIAGNOSIS — Z79899 Other long term (current) drug therapy: Secondary | ICD-10-CM | POA: Diagnosis not present

## 2021-11-05 DIAGNOSIS — Z8249 Family history of ischemic heart disease and other diseases of the circulatory system: Secondary | ICD-10-CM

## 2021-11-05 DIAGNOSIS — Z825 Family history of asthma and other chronic lower respiratory diseases: Secondary | ICD-10-CM | POA: Diagnosis not present

## 2021-11-05 DIAGNOSIS — Z800 Family history of malignant neoplasm of digestive organs: Secondary | ICD-10-CM

## 2021-11-05 DIAGNOSIS — J449 Chronic obstructive pulmonary disease, unspecified: Secondary | ICD-10-CM | POA: Diagnosis present

## 2021-11-05 DIAGNOSIS — N12 Tubulo-interstitial nephritis, not specified as acute or chronic: Secondary | ICD-10-CM | POA: Diagnosis present

## 2021-11-05 DIAGNOSIS — I10 Essential (primary) hypertension: Secondary | ICD-10-CM | POA: Diagnosis present

## 2021-11-05 DIAGNOSIS — N10 Acute tubulo-interstitial nephritis: Principal | ICD-10-CM | POA: Diagnosis present

## 2021-11-05 DIAGNOSIS — B9620 Unspecified Escherichia coli [E. coli] as the cause of diseases classified elsewhere: Secondary | ICD-10-CM | POA: Diagnosis present

## 2021-11-05 DIAGNOSIS — E871 Hypo-osmolality and hyponatremia: Secondary | ICD-10-CM | POA: Diagnosis present

## 2021-11-05 DIAGNOSIS — F17210 Nicotine dependence, cigarettes, uncomplicated: Secondary | ICD-10-CM | POA: Diagnosis present

## 2021-11-05 DIAGNOSIS — A419 Sepsis, unspecified organism: Secondary | ICD-10-CM

## 2021-11-05 DIAGNOSIS — Z833 Family history of diabetes mellitus: Secondary | ICD-10-CM | POA: Diagnosis not present

## 2021-11-05 DIAGNOSIS — R7881 Bacteremia: Secondary | ICD-10-CM | POA: Diagnosis present

## 2021-11-05 DIAGNOSIS — Z91018 Allergy to other foods: Secondary | ICD-10-CM

## 2021-11-05 DIAGNOSIS — O99320 Drug use complicating pregnancy, unspecified trimester: Secondary | ICD-10-CM | POA: Diagnosis present

## 2021-11-05 DIAGNOSIS — Z888 Allergy status to other drugs, medicaments and biological substances status: Secondary | ICD-10-CM

## 2021-11-05 DIAGNOSIS — F1910 Other psychoactive substance abuse, uncomplicated: Secondary | ICD-10-CM | POA: Diagnosis present

## 2021-11-05 LAB — CBC WITH DIFFERENTIAL/PLATELET
Abs Immature Granulocytes: 0.07 10*3/uL (ref 0.00–0.07)
Basophils Absolute: 0.1 10*3/uL (ref 0.0–0.1)
Basophils Relative: 0 %
Eosinophils Absolute: 0.1 10*3/uL (ref 0.0–0.5)
Eosinophils Relative: 1 %
HCT: 39.7 % (ref 36.0–46.0)
Hemoglobin: 12.9 g/dL (ref 12.0–15.0)
Immature Granulocytes: 1 %
Lymphocytes Relative: 19 %
Lymphs Abs: 2.4 10*3/uL (ref 0.7–4.0)
MCH: 28.2 pg (ref 26.0–34.0)
MCHC: 32.5 g/dL (ref 30.0–36.0)
MCV: 86.9 fL (ref 80.0–100.0)
Monocytes Absolute: 1.4 10*3/uL — ABNORMAL HIGH (ref 0.1–1.0)
Monocytes Relative: 11 %
Neutro Abs: 8.8 10*3/uL — ABNORMAL HIGH (ref 1.7–7.7)
Neutrophils Relative %: 68 %
Platelets: 194 10*3/uL (ref 150–400)
RBC: 4.57 MIL/uL (ref 3.87–5.11)
RDW: 15.9 % — ABNORMAL HIGH (ref 11.5–15.5)
WBC: 12.8 10*3/uL — ABNORMAL HIGH (ref 4.0–10.5)
nRBC: 0 % (ref 0.0–0.2)

## 2021-11-05 LAB — URINALYSIS, ROUTINE W REFLEX MICROSCOPIC
Bilirubin Urine: NEGATIVE
Glucose, UA: NEGATIVE mg/dL
Ketones, ur: NEGATIVE mg/dL
Nitrite: NEGATIVE
Protein, ur: NEGATIVE mg/dL
Specific Gravity, Urine: 1.008 (ref 1.005–1.030)
WBC, UA: >50 WBC/hpf — ABNORMAL HIGH (ref 0–5)
pH: 6 (ref 5.0–8.0)

## 2021-11-05 LAB — COMPREHENSIVE METABOLIC PANEL
ALT: 44 U/L (ref 0–44)
AST: 29 U/L (ref 15–41)
Albumin: 3.6 g/dL (ref 3.5–5.0)
Alkaline Phosphatase: 54 U/L (ref 38–126)
Anion gap: 8 (ref 5–15)
BUN: 13 mg/dL (ref 6–20)
CO2: 24 mmol/L (ref 22–32)
Calcium: 8.6 mg/dL — ABNORMAL LOW (ref 8.9–10.3)
Chloride: 101 mmol/L (ref 98–111)
Creatinine, Ser: 0.74 mg/dL (ref 0.44–1.00)
GFR, Estimated: >60 mL/min (ref >60)
Glucose, Bld: 108 mg/dL — ABNORMAL HIGH (ref 70–99)
Potassium: 4.5 mmol/L (ref 3.5–5.1)
Sodium: 133 mmol/L — ABNORMAL LOW (ref 135–145)
Total Bilirubin: 0.8 mg/dL (ref 0.3–1.2)
Total Protein: 7.8 g/dL (ref 6.5–8.1)

## 2021-11-05 LAB — CBC
HCT: 39.4 % (ref 36.0–46.0)
Hemoglobin: 13.1 g/dL (ref 12.0–15.0)
MCH: 28.5 pg (ref 26.0–34.0)
MCHC: 33.2 g/dL (ref 30.0–36.0)
MCV: 85.7 fL (ref 80.0–100.0)
Platelets: 221 10*3/uL (ref 150–400)
RBC: 4.6 MIL/uL (ref 3.87–5.11)
RDW: 15.8 % — ABNORMAL HIGH (ref 11.5–15.5)
WBC: 14.6 10*3/uL — ABNORMAL HIGH (ref 4.0–10.5)
nRBC: 0 % (ref 0.0–0.2)

## 2021-11-05 LAB — CREATININE, SERUM
Creatinine, Ser: 0.76 mg/dL (ref 0.44–1.00)
GFR, Estimated: >60 mL/min (ref >60)

## 2021-11-05 LAB — HIV ANTIBODY (ROUTINE TESTING W REFLEX): HIV Screen 4th Generation wRfx: NONREACTIVE

## 2021-11-05 LAB — LACTIC ACID, PLASMA: Lactic Acid, Venous: 0.6 mmol/L (ref 0.5–1.9)

## 2021-11-05 LAB — LIPASE, BLOOD: Lipase: 30 U/L (ref 11–51)

## 2021-11-05 MED ORDER — ONDANSETRON HCL 4 MG/2ML IJ SOLN: 4.0000 mg | Freq: Four times a day (QID) | INTRAMUSCULAR | Status: DC | PRN

## 2021-11-05 MED ORDER — ONDANSETRON HCL 4 MG PO TABS: 4.0000 mg | ORAL_TABLET | Freq: Four times a day (QID) | ORAL | Status: DC | PRN

## 2021-11-05 MED ORDER — HYDROMORPHONE HCL 1 MG/ML IJ SOLN
0.5000 mg | INTRAMUSCULAR | Status: DC | PRN
  Administered 2021-11-05 – 2021-11-07 (×3): 0.5 mg via INTRAVENOUS
  Filled 2021-11-05 (×3): qty 0.5

## 2021-11-05 MED ORDER — ACETAMINOPHEN 650 MG RE SUPP: 650.0000 mg | Freq: Four times a day (QID) | RECTAL | Status: DC | PRN

## 2021-11-05 MED ORDER — IPRATROPIUM-ALBUTEROL 0.5-2.5 (3) MG/3ML IN SOLN: 3.0000 mL | Freq: Four times a day (QID) | RESPIRATORY_TRACT | Status: DC | PRN

## 2021-11-05 MED ORDER — HYDRALAZINE HCL 20 MG/ML IJ SOLN: 10.0000 mg | INTRAMUSCULAR | Status: DC | PRN

## 2021-11-05 MED ORDER — BUPRENORPHINE HCL-NALOXONE HCL 8-2 MG SL SUBL
1.0000 | SUBLINGUAL_TABLET | Freq: Two times a day (BID) | SUBLINGUAL | Status: DC
  Administered 2021-11-05 – 2021-11-07 (×4): 1 via SUBLINGUAL
  Filled 2021-11-05 (×4): qty 1

## 2021-11-05 MED ORDER — SODIUM CHLORIDE 0.9 % IV BOLUS
1500.0000 mL | Freq: Once | INTRAVENOUS | Status: AC
  Administered 2021-11-05: 1500 mL via INTRAVENOUS

## 2021-11-05 MED ORDER — ENOXAPARIN SODIUM 40 MG/0.4ML IJ SOSY
40.0000 mg | PREFILLED_SYRINGE | INTRAMUSCULAR | Status: DC
  Administered 2021-11-05 – 2021-11-06 (×2): 40 mg via SUBCUTANEOUS
  Filled 2021-11-05 (×2): qty 0.4

## 2021-11-05 MED ORDER — ACETAMINOPHEN 325 MG PO TABS
650.0000 mg | ORAL_TABLET | Freq: Four times a day (QID) | ORAL | Status: DC | PRN
  Administered 2021-11-05 – 2021-11-06 (×4): 650 mg via ORAL
  Filled 2021-11-05 (×4): qty 2

## 2021-11-05 MED ORDER — SODIUM CHLORIDE 0.9 % IV SOLN
1.0000 g | Freq: Once | INTRAVENOUS | Status: AC
  Administered 2021-11-05: 1 g via INTRAVENOUS
  Filled 2021-11-05: qty 10

## 2021-11-05 MED ORDER — SODIUM CHLORIDE 0.9 % IV SOLN
1.0000 g | INTRAVENOUS | Status: DC
  Administered 2021-11-06: 1 g via INTRAVENOUS
  Filled 2021-11-05: qty 10

## 2021-11-05 NOTE — ED Provider Notes (Signed)
[facility] Provider Note   CSN: [id number] Arrival date & time: 11/05/21  1040     History  No chief complaint on file.   [name] is a 51 y.o. female presented to ED with complaint of back pain and fevers and chills.  The patient reports that she was treated for dental infection with amoxicillin, and has been taking this for several days, with improvement of the swelling in her face, but subsequently for the past 2 days developed the right lower back pain, urinary pressure, and also shaking chills.  She does have a history of UTI and kidney stone and this feels similar.  She reports a Tmax of 102 at home.  HPI     Home Medications Prior to Admission medications   Medication Sig Start Date End Date Taking? Authorizing Provider  acetaminophen (TYLENOL) 500 MG tablet Take 1,000 mg by mouth 3 (three) times daily as needed for moderate pain.    Yes [provider]  albuterol (VENTOLIN HFA) 108 (90 Base) MCG/ACT inhaler Inhale 2 puffs into the lungs every 4 (four) hours as needed for wheezing or shortness of breath. 12/09/19  Yes Rancour, Annie Main, MD  amoxicillin (AMOXIL) 500 MG tablet Take 500 mg by mouth 3 (three) times daily. 10/26/21  Yes [provider]  Buprenorphine HCl-Naloxone HCl (SUBOXONE) 8-2 MG FILM Place 1 Film under the tongue 2 (two) times a day.   Yes [provider]  ibuprofen (ADVIL) 200 MG tablet Take 400 mg by mouth 2 (two) times daily as needed for moderate pain.    Yes [provider]  MOUNJARO 5 MG/0.5ML Pen Inject 5 mg into the skin once a week. 11/03/21  Yes [provider]  Vitamin D, Ergocalciferol, (DRISDOL) 1.25 MG (50000 UNIT) CAPS capsule Take 50,000 Units by mouth once a week. 08/26/21  Yes [provider]  Grant Ruts INHUB 250-50 MCG/ACT AEPB Inhale 1 puff into the lungs every 12 (twelve) hours. 08/24/21  Yes [provider]      Allergies    Mango flavor and Pyridium  [phenazopyridine hcl]    Review of Systems   Review of Systems  Physical Exam Updated Vital Signs BP (!) 147/122   Pulse 93   Temp 99.4 F (37.4 C) (Oral)   Resp 20   Ht 5' (1.524 m)   Wt 113.4 kg   LMP 07/01/2016   SpO2 96%   BMI 48.82 kg/m  Physical Exam Constitutional:      General: She is not in acute distress. HENT:     Head: Normocephalic and atraumatic.  Eyes:     Conjunctiva/sclera: Conjunctivae normal.     Pupils: Pupils are equal, round, and reactive to light.  Cardiovascular:     Rate and Rhythm: Normal rate and regular rhythm.  Pulmonary:     Effort: Pulmonary effort is normal. No respiratory distress.  Abdominal:     General: There is no distension.     Tenderness: There is no abdominal tenderness. There is no guarding.  Skin:    General: Skin is warm and dry.  Neurological:     General: No focal deficit present.     Mental Status: She is alert. Mental status is at baseline.  Psychiatric:        Mood and Affect: Mood normal.        Behavior: Behavior normal.     ED Results / Procedures / Treatments   Labs (all labs ordered are listed, but  only abnormal results are displayed) Labs Reviewed  URINALYSIS, ROUTINE W REFLEX MICROSCOPIC - Abnormal; Notable for the following components:      Result Value   APPearance HAZY (*)    Hgb urine dipstick MODERATE (*)    Leukocytes,Ua LARGE (*)    WBC, UA >50 (*)    Bacteria, UA FEW (*)    All other components within normal limits  COMPREHENSIVE METABOLIC PANEL - Abnormal; Notable for the following components:   Sodium 133 (*)    Glucose, Bld 108 (*)    Calcium 8.6 (*)    All other components within normal limits  CBC WITH DIFFERENTIAL/PLATELET - Abnormal; Notable for the following components:   WBC 12.8 (*)    RDW 15.9 (*)    Neutro Abs 8.8 (*)    Monocytes Absolute 1.4 (*)    All other components within normal limits  CULTURE, BLOOD (SINGLE)  URINE CULTURE  CULTURE, BLOOD (SINGLE)  LIPASE, BLOOD   LACTIC ACID, PLASMA    EKG None  Radiology CT Renal Stone Study  Result Date: 11/05/2021 CLINICAL DATA:  Right flank pain, chills EXAM: CT ABDOMEN AND PELVIS WITHOUT CONTRAST TECHNIQUE: Multidetector CT imaging of the abdomen and pelvis was performed following the standard protocol without IV contrast. RADIATION DOSE REDUCTION: This exam was performed according to the departmental dose-optimization program which includes automated exposure control, adjustment of the mA and/or kV according to patient size and/or use of iterative reconstruction technique. COMPARISON:  11/06/2018 FINDINGS: Lower chest: Unremarkable. Hepatobiliary: No focal abnormalities are seen in the liver. Gallbladder is not distended. There is no dilation of bile ducts. Pancreas: No focal abnormalities are seen. Spleen: Unremarkable. Adrenals/Urinary Tract: Adrenals are unremarkable. There is minimal prominence of the pelvocaliceal system in the right kidney. There is mild perinephric stranding around the kidneys, more so on the right side. There is mild wall thickening in right renal pelvis and proximal right ureter. In thin 3 mm axial images, there is a possible 1 mm calculus in the lower pole of right kidney. There is no definite demonstrable calcific density in the ureters. There are a few small round calcific densities along the course of right ureter which have not changed significantly suggesting possible vascular calcifications. Urinary bladder is unremarkable. Stomach/Bowel: Stomach is unremarkable. Small bowel loops are not dilated. Appendix is not dilated. There is no wall thickening and colon. There is no pericolic stranding. Vascular/Lymphatic: Scattered arterial calcifications are seen. Reproductive: Unremarkable. Other: There is no ascites or pneumoperitoneum. Umbilical and paraumbilical hernias containing fat are noted. Musculoskeletal: No acute findings are seen. IMPRESSION: There is perinephric stranding around the  kidneys, more so on the right side. Pelvocaliceal system in the right kidney is slightly more prominent in comparison to the left side. There is mild thickening in the wall of the right renal pelvis and proximal right ureter. Findings suggest possible acute pyelonephritis. Please correlate with laboratory findings. Another possibility would be recent passage of right ureteral calculus. There are no definite demonstrable opaque ureteral stones.There are no demonstrable opaque stones in the urinary bladder. There is possible 1 mm right renal calculus. There is no evidence of intestinal obstruction or pneumoperitoneum. Appendix is not dilated. Other findings as described in the body of the report. Electronically Signed   By: Elmer Picker M.D.   On: 11/05/2021 14:23    Procedures .Critical Care  Performed by: Wyvonnia Dusky, MD Authorized by: Wyvonnia Dusky, MD   Critical care provider statement:  Critical care time (minutes):  40   Critical care time was exclusive of:  Separately billable procedures and treating other patients   Critical care was necessary to treat or prevent imminent or life-threatening deterioration of the following conditions:  Sepsis   Critical care was time spent personally by me on the following activities:  Ordering and performing treatments and interventions, ordering and review of laboratory studies, ordering and review of radiographic studies, pulse oximetry, review of old charts, examination of patient and evaluation of patient's response to treatment   Care discussed with: admitting provider       Medications Ordered in ED Medications  cefTRIAXone (ROCEPHIN) 1 g in sodium chloride 0.9 % 100 mL IVPB (1 g Intravenous New Bag/Given 11/05/21 1506)  sodium chloride 0.9 % bolus 1,500 mL (1,500 mLs Intravenous Bolus 11/05/21 1505)    ED Course/ Medical Decision Making/ A&P Clinical Course as of 11/05/21 1508  Thu Nov 05, 2021  1429 Lactic Acid, Venous: 0.6  [MT]  1429 WBC(!): 12.8 [MT]  1444 Will admit for pyelonephritis, rule out urosepsis - fluid bolus ordered per [employer] WEIGHT 1.5 L total.  Rocephin for UTI coverage.  Lactate wnl.  BP hypertensive, no evidence of shock.   [MT]  3419 Admitted to hospitalist [MT]    Clinical Course User Index [MT] Arianie Couse, Carola Rhine, MD                           Medical Decision Making Amount and/or Complexity of Data Reviewed Labs: ordered. Decision-making details documented in ED Course. Radiology: ordered.  Risk Decision regarding hospitalization.   This patient presents to the ED with concern for flank pain, fevers and chills. This involves an extensive number of treatment options, and is a complaint that carries with it a high risk of complications and morbidity.  The differential diagnosis includes ureteral colic versus UTI versus other intra-abdominal infection versus other  Co-morbidities that complicate the patient evaluation: Patient does report history of IV drug use and sepsis and endocarditis in the past, reports she has been sober for 6 years and completed treatment, but does raise concern for recurring infections, or seeded infection  External records from outside source obtained and reviewed including transesophageal echo in 2017 showing mobile vegetation on the tricuspid valve.  Most recent echocardiogram 2019 showing resolution of vegetation and no other acute findings.  I ordered and personally interpreted labs.  The pertinent results include:  WBC elevated, lactate wnl  I ordered imaging studies including CT renal stone study I independently visualized and interpreted imaging which showed perinephric stranding consistent with possible pyelonephritis I agree with the radiologist interpretation  The patient was maintained on a cardiac monitor.  I personally viewed and interpreted the cardiac monitored which showed an underlying rhythm of: Sinus rhythm  I ordered medication including  IV fluids, IV Rocephin for possible sepsis and urinary source I have reviewed the patients home medicines and have made adjustments as needed  Test Considered: Doubt acute mesenteric ischemia or pulmonary embolism, not feel these tests are indicated at this time  After the interventions noted above, I reevaluated the patient and found that they have: stayed the same  Dispostion:  After consideration of the diagnostic results and the patients response to treatment, I feel that the patent would benefit from medical admission.         Final Clinical Impression(s) / ED Diagnoses Final diagnoses:  Pyelonephritis  Sepsis, due to  unspecified organism, unspecified whether acute organ dysfunction present [facility])    Rx / DC Orders ED Discharge Orders     None         Jahira Swiss, Carola Rhine, MD 11/05/21 [phone]

## 2021-11-05 NOTE — ED Triage Notes (Signed)
Pt presents with urinary symptoms back pain, chills, and abdominal pain, x 1 week, recently on ABTs  for dental infection.
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
## 2021-11-05 NOTE — ED Provider Triage Note (Signed)
Emergency Medicine Provider Triage Evaluation Note  [name] , a 51 y.o. female  was evaluated in triage.  Patient complains of back pain and fevers.  Reports that within the last few weeks she was treated for dental abscess with antibiotics.  Since then she has felt like she has had a UTI, dysuria.  Also complaining of back pain.  Says that she has been having fevers and chills for the past few days, worse last night and she had a temperature of 102.  Review of Systems  Positive: Fevers, abdominal and back pain and dysuria Negative: Vomiting or diarrhea  Physical Exam  BP (!) 147/122   Pulse 93   Temp 99.4 F (37.4 C) (Oral)   Resp 20   Ht 5' (1.524 m)   Wt 113.4 kg   LMP 07/01/2016   SpO2 96%   BMI 48.82 kg/m  Gen:   Awake, no distress   Resp:  Normal effort  MSK:   Moves extremities without difficulty  Other:  Nontender abdomen, holding urine that is without gross hematuria  Medical Decision Making  Medically screening exam initiated at 12:28 PM.  Appropriate orders placed.  [name] was informed that the remainder of the evaluation will be completed by another provider, this initial triage assessment does not replace that evaluation, and the importance of remaining in the ED until their evaluation is complete.     Rhae Hammock, PA-C 11/05/21 1229

## 2021-11-06 DIAGNOSIS — N12 Tubulo-interstitial nephritis, not specified as acute or chronic: Secondary | ICD-10-CM | POA: Diagnosis not present

## 2021-11-06 LAB — BLOOD CULTURE ID PANEL (REFLEXED) - BCID2

## 2021-11-06 LAB — CBC
HCT: 37.5 % (ref 36.0–46.0)
Hemoglobin: 12.3 g/dL (ref 12.0–15.0)
MCH: 28.5 pg (ref 26.0–34.0)
MCHC: 32.8 g/dL (ref 30.0–36.0)
MCV: 87 fL (ref 80.0–100.0)
Platelets: 188 10*3/uL (ref 150–400)
RBC: 4.31 MIL/uL (ref 3.87–5.11)
RDW: 15.8 % — ABNORMAL HIGH (ref 11.5–15.5)
WBC: 12.6 10*3/uL — ABNORMAL HIGH (ref 4.0–10.5)
nRBC: 0 % (ref 0.0–0.2)

## 2021-11-06 LAB — BASIC METABOLIC PANEL
Anion gap: 7 (ref 5–15)
BUN: 11 mg/dL (ref 6–20)
CO2: 24 mmol/L (ref 22–32)
Calcium: 8.5 mg/dL — ABNORMAL LOW (ref 8.9–10.3)
Chloride: 102 mmol/L (ref 98–111)
Creatinine, Ser: 0.77 mg/dL (ref 0.44–1.00)
GFR, Estimated: >60 mL/min (ref >60)
Glucose, Bld: 159 mg/dL — ABNORMAL HIGH (ref 70–99)
Potassium: 4 mmol/L (ref 3.5–5.1)
Sodium: 133 mmol/L — ABNORMAL LOW (ref 135–145)

## 2021-11-06 LAB — MAGNESIUM: Magnesium: 2 mg/dL (ref 1.7–2.4)

## 2021-11-06 MED ORDER — SODIUM CHLORIDE 0.9 % IV SOLN
1.0000 g | Freq: Once | INTRAVENOUS | Status: AC
  Administered 2021-11-06: 1 g via INTRAVENOUS
  Filled 2021-11-06: qty 10

## 2021-11-06 MED ORDER — SODIUM CHLORIDE 0.9 % IV SOLN
2.0000 g | INTRAVENOUS | Status: DC
  Administered 2021-11-07: 2 g via INTRAVENOUS
  Filled 2021-11-06: qty 20

## 2021-11-06 NOTE — Progress Notes (Signed)
PROGRESS NOTE    [name]  SWH:[id number] DOB: Apr 12, 1971 DOA: 11/05/2021 PCP: Emelia Loron, NP   Brief Narrative:    [name] is a 51 y.o. 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  She was admitted for acute bilateral pyelonephritis and started on IV Rocephin empirically.  She was then noted to have E. coli bacteremia.  Assessment & Plan:   Principal Problem:   Pyelonephritis Active Problems:   Opioid use disorder, severe, dependence ([place])   IVDA (intravenous drug abuse) complicating pregnancy ([place])  Assessment and Plan:   Acute bilateral pyelonephritis/UTI with E. coli bacteremia -Continue IV Rocephin empirically, now up to 2 g daily -Monitor blood and urine cultures -Zofran as needed for nausea or vomiting -IV Dilaudid/Toradol as needed for pain management -Monitor leukocytosis   Mild hyponatremia-stable -Monitor on IV normal saline -Recheck in a.m.   History of IVDA/polysubstance abuse -Patient claims to have quit using heroin 6 months ago -Recently used cocaine which led to dental abscess   History of COPD -Without acute bronchospasms currently, DuoNebs as needed   Tobacco abuse -Counseled on cessation   Anxiety disorder -Continue home medications   Hypertension -Blood pressure elevation may be related to pain -IV hydralazine as needed for now   Morbid obesity -BMI 48.82 -Lifestyle changes outpatient -Appears to be on Mounjaro   DVT prophylaxis: Lovenox Code Status: Full Family Communication: None at bedside Disposition Plan:  Status is: Inpatient Remains inpatient appropriate because: Need for IV antibiotics.   Consultants:  None  Procedures:  None  Antimicrobials:  Anti-infectives (From admission, onward)    Start      Dose/Rate Route Frequency Ordered Stop   11/07/21 1000  cefTRIAXone (ROCEPHIN) 2 g in sodium chloride 0.9 % 100 mL IVPB        2 g 200 mL/hr over 30 Minutes Intravenous Every 24 hours 11/06/21 1026     11/06/21 1115  cefTRIAXone (ROCEPHIN) 1 g in sodium chloride 0.9 % 100 mL IVPB        1 g 200 mL/hr over 30 Minutes Intravenous  Once 11/06/21 1027     11/06/21 1000  cefTRIAXone (ROCEPHIN) 1 g in sodium chloride 0.9 % 100 mL IVPB  Status:  Discontinued        1 g 200 mL/hr over 30 Minutes Intravenous Every 24 hours 11/05/21 1513 11/06/21 1026   11/05/21 1430  cefTRIAXone (ROCEPHIN) 1 g in sodium chloride 0.9 % 100 mL IVPB        1 g 200 mL/hr over 30 Minutes Intravenous  Once 11/05/21 1427 11/05/21 1657       Subjective: Patient seen and evaluated today with no new acute complaints or concerns. No acute concerns or events noted overnight.  She states that her bilateral flank pain is improving.  Mild fever noted overnight.  Objective: Vitals:   11/05/21 1712 11/05/21 2202 11/06/21 0428 11/06/21 0539  BP: (!) 142/88 128/74 116/63   Pulse: 95 79 92   Resp: '18 18 18   '$ Temp: 98.9 F (37.2 C) 99.6 F (37.6 C) (!) 100.8 F (38.2 C) 100 F (37.8 C)  TempSrc: Oral Oral Oral Oral  SpO2: 97% 98% 93%   Weight: 115 kg     Height: 5' (1.524 m)  Intake/Output Summary (Last 24 hours) at 11/06/2021 1037 Last data filed at 11/06/2021 0438 Gross per 24 hour  Intake 940 ml  Output --  Net 940 ml   Filed Weights   11/05/21 1203 11/05/21 1712  Weight: 113.4 kg 115 kg    Examination:  General exam: Appears calm and comfortable, obese Respiratory system: Clear to auscultation. Respiratory effort normal. Cardiovascular system: S1 & S2 heard, RRR.  Gastrointestinal system: Abdomen is soft Central nervous system: Alert and awake Extremities: No edema Skin: No significant lesions noted Psychiatry: Flat affect.    Data Reviewed: I have personally reviewed following labs and  imaging studies  CBC: Recent Labs  Lab 11/05/21 1243 11/05/21 1544 11/06/21 0539  WBC 12.8* 14.6* 12.6*  NEUTROABS 8.8*  --   --   HGB 12.9 13.1 12.3  HCT 39.7 39.4 37.5  MCV 86.9 85.7 87.0  PLT 194 221 284   Basic Metabolic Panel: Recent Labs  Lab 11/05/21 1243 11/05/21 1544 11/06/21 0539  NA 133*  --  133*  K 4.5  --  4.0  CL 101  --  102  CO2 24  --  24  GLUCOSE 108*  --  159*  BUN 13  --  11  CREATININE 0.74 0.76 0.77  CALCIUM 8.6*  --  8.5*  MG  --   --  2.0   GFR: Estimated Creatinine Clearance: 98.4 mL/min (by C-G formula based on SCr of 0.77 mg/dL). Liver Function Tests: Recent Labs  Lab 11/05/21 1243  AST 29  ALT 44  ALKPHOS 54  BILITOT 0.8  PROT 7.8  ALBUMIN 3.6   Recent Labs  Lab 11/05/21 1243  LIPASE 30   No results for input(s): "AMMONIA" in the last 168 hours. Coagulation Profile: No results for input(s): "INR", "PROTIME" in the last 168 hours. Cardiac Enzymes: No results for input(s): "CKTOTAL", "CKMB", "CKMBINDEX", "TROPONINI" in the last 168 hours. BNP (last 3 results) No results for input(s): "PROBNP" in the last 8760 hours. HbA1C: No results for input(s): "HGBA1C" in the last 72 hours. CBG: No results for input(s): "GLUCAP" in the last 168 hours. Lipid Profile: No results for input(s): "CHOL", "HDL", "LDLCALC", "TRIG", "CHOLHDL", "LDLDIRECT" in the last 72 hours. Thyroid Function Tests: No results for input(s): "TSH", "T4TOTAL", "FREET4", "T3FREE", "THYROIDAB" in the last 72 hours. Anemia Panel: No results for input(s): "VITAMINB12", "FOLATE", "FERRITIN", "TIBC", "IRON", "RETICCTPCT" in the last 72 hours. Sepsis Labs: Recent Labs  Lab 11/05/21 1248  LATICACIDVEN 0.6    Recent Results (from the past 240 hour(s))  Culture, blood (single)     Status: None (Preliminary result)   Collection Time: 11/05/21  2:19 PM   Specimen: BLOOD  Result Value Ref Range Status   Specimen Description   Final    BLOOD BLOOD RIGHT  FOREARM Performed at [facility], [address], [place], [place] [address]    Special Requests   Final    BOTTLES DRAWN AEROBIC AND ANAEROBIC Blood Culture adequate volume Performed at [facility], [address], [place], [place] [address]    Culture  Setup Time   Final    GRAM NEGATIVE RODS Organism ID to follow IN BOTH AEROBIC AND ANAEROBIC BOTTLES ESCHERICHIA COLI CRITICAL RESULT CALLED TO, READ BACK BY AND VERIFIED WITH: Maralyn Sago HURTH [id number] BY EC Performed at [facility], [place] [address], [place],  [address]    Culture GRAM NEGATIVE RODS  Final   Report Status PENDING  Incomplete  Blood  Culture ID Panel (Reflexed)     Status: Abnormal   Collection Time: 11/05/21  2:19 PM  Result Value Ref Range Status   Enterococcus faecalis NOT DETECTED NOT DETECTED Final   Enterococcus Faecium NOT DETECTED NOT DETECTED Final   Listeria monocytogenes NOT DETECTED NOT DETECTED Final   Staphylococcus species NOT DETECTED NOT DETECTED Final   Staphylococcus aureus (BCID) NOT DETECTED NOT DETECTED Final   Staphylococcus epidermidis NOT DETECTED NOT DETECTED Final   Staphylococcus lugdunensis NOT DETECTED NOT DETECTED Final   Streptococcus species NOT DETECTED NOT DETECTED Final   Streptococcus agalactiae NOT DETECTED NOT DETECTED Final   Streptococcus pneumoniae NOT DETECTED NOT DETECTED Final   Streptococcus pyogenes NOT DETECTED NOT DETECTED Final   A.calcoaceticus-baumannii NOT DETECTED NOT DETECTED Final   Bacteroides fragilis NOT DETECTED NOT DETECTED Final   Enterobacterales DETECTED (A) NOT DETECTED Final    Comment: Enterobacterales represent a large order of gram negative bacteria, not a single organism. CRITICAL RESULT CALLED TO, READ BACK BY AND VERIFIED WITH: [place] [id number] AM BY EC    Enterobacter cloacae complex NOT DETECTED NOT DETECTED Final   Escherichia coli DETECTED (A) NOT DETECTED Final    Comment: CRITICAL RESULT CALLED TO, READ BACK  BY AND VERIFIED WITH: [place] [id number] AM BY EC    Klebsiella aerogenes NOT DETECTED NOT DETECTED Final   Klebsiella oxytoca NOT DETECTED NOT DETECTED Final   Klebsiella pneumoniae NOT DETECTED NOT DETECTED Final   Proteus species NOT DETECTED NOT DETECTED Final   Salmonella species NOT DETECTED NOT DETECTED Final   Serratia marcescens NOT DETECTED NOT DETECTED Final   Haemophilus influenzae NOT DETECTED NOT DETECTED Final   Neisseria meningitidis NOT DETECTED NOT DETECTED Final   Pseudomonas aeruginosa NOT DETECTED NOT DETECTED Final   Stenotrophomonas maltophilia NOT DETECTED NOT DETECTED Final   Candida albicans NOT DETECTED NOT DETECTED Final   Candida auris NOT DETECTED NOT DETECTED Final   Candida glabrata NOT DETECTED NOT DETECTED Final   Candida krusei NOT DETECTED NOT DETECTED Final   Candida parapsilosis NOT DETECTED NOT DETECTED Final   Candida tropicalis NOT DETECTED NOT DETECTED Final   Cryptococcus neoformans/gattii NOT DETECTED NOT DETECTED Final   CTX-M ESBL NOT DETECTED NOT DETECTED Final   Carbapenem resistance IMP NOT DETECTED NOT DETECTED Final   Carbapenem resistance KPC NOT DETECTED NOT DETECTED Final   Carbapenem resistance NDM NOT DETECTED NOT DETECTED Final   Carbapenem resist OXA 48 LIKE NOT DETECTED NOT DETECTED Final   Carbapenem resistance VIM NOT DETECTED NOT DETECTED Final    Comment: Performed at [facility], [address], [place], [place] [address]  Culture, blood (single)     Status: None (Preliminary result)   Collection Time: 11/05/21  2:58 PM   Specimen: BLOOD  Result Value Ref Range Status   Specimen Description BLOOD BLOOD RIGHT ARM  Final   Special Requests   Final    BOTTLES DRAWN AEROBIC AND ANAEROBIC Blood Culture adequate volume   Culture   Final    NO GROWTH < 12 HOURS Performed at [facility], [address], [place], [place] [address]    Report Status PENDING  Incomplete         Radiology Studies: CT Renal  Stone Study  Result Date: 11/05/2021 CLINICAL DATA:  Right flank pain, chills EXAM: CT ABDOMEN AND PELVIS WITHOUT CONTRAST TECHNIQUE: Multidetector CT imaging of the abdomen and pelvis was performed following the standard protocol without IV contrast.  RADIATION DOSE REDUCTION: This exam was performed according to the departmental dose-optimization program which includes automated exposure control, adjustment of the mA and/or kV according to patient size and/or use of iterative reconstruction technique. COMPARISON:  11/06/2018 FINDINGS: Lower chest: Unremarkable. Hepatobiliary: No focal abnormalities are seen in the liver. Gallbladder is not distended. There is no dilation of bile ducts. Pancreas: No focal abnormalities are seen. Spleen: Unremarkable. Adrenals/Urinary Tract: Adrenals are unremarkable. There is minimal prominence of the pelvocaliceal system in the right kidney. There is mild perinephric stranding around the kidneys, more so on the right side. There is mild wall thickening in right renal pelvis and proximal right ureter. In thin 3 mm axial images, there is a possible 1 mm calculus in the lower pole of right kidney. There is no definite demonstrable calcific density in the ureters. There are a few small round calcific densities along the course of right ureter which have not changed significantly suggesting possible vascular calcifications. Urinary bladder is unremarkable. Stomach/Bowel: Stomach is unremarkable. Small bowel loops are not dilated. Appendix is not dilated. There is no wall thickening and colon. There is no pericolic stranding. Vascular/Lymphatic: Scattered arterial calcifications are seen. Reproductive: Unremarkable. Other: There is no ascites or pneumoperitoneum. Umbilical and paraumbilical hernias containing fat are noted. Musculoskeletal: No acute findings are seen. IMPRESSION: There is perinephric stranding around the kidneys, more so on the right side. Pelvocaliceal system in the  right kidney is slightly more prominent in comparison to the left side. There is mild thickening in the wall of the right renal pelvis and proximal right ureter. Findings suggest possible acute pyelonephritis. Please correlate with laboratory findings. Another possibility would be recent passage of right ureteral calculus. There are no definite demonstrable opaque ureteral stones.There are no demonstrable opaque stones in the urinary bladder. There is possible 1 mm right renal calculus. There is no evidence of intestinal obstruction or pneumoperitoneum. Appendix is not dilated. Other findings as described in the body of the report. Electronically Signed   By: Elmer Picker M.D.   On: 11/05/2021 14:23        Scheduled Meds:  buprenorphine-naloxone  1 tablet Sublingual BID   enoxaparin (LOVENOX) injection  40 mg Subcutaneous Q24H   Continuous Infusions:  cefTRIAXone (ROCEPHIN)  IV     [START ON 11/07/2021] cefTRIAXone (ROCEPHIN)  IV       LOS: 1 day    Time spent: 35 minutes    Benjiman Sedgwick Darleen Crocker, DO [facility]  If 7PM-7AM, please contact night-coverage [email or web address] 11/06/2021, 10:37 AM

## 2021-11-06 NOTE — Progress Notes (Signed)
  Transition of Care [facility]) Screening Note   Patient Details  Name: ZALEA PETE Date of Birth: Jul 18, 1970   Transition of Care [facility]) CM/SW Contact:    Iona Beard, [place] Phone Number: 11/06/2021, 11:11 AM    Transition of Care Department [facility]) has reviewed patient and no TOC needs have been identified at this time. We will continue to monitor patient advancement through interdisciplinary progression rounds. If new patient transition needs arise, please place a TOC consult.

## 2021-11-06 NOTE — Progress Notes (Signed)
PHARMACY - PHYSICIAN COMMUNICATION CRITICAL VALUE ALERT - BLOOD CULTURE IDENTIFICATION (BCID)  [name] is an 51 y.o. female who presented to [facility] on [facility]/05/2021 with a chief complaint of bilateral flank pain   Assessment:  2/4 bottles e coli no resistance detected in blood (include suspected source if known)  Name of physician (or Provider) Contacted: Dr Manuella Ghazi  Current antibiotics: Ceftriaxone 1gm   Changes to prescribed antibiotics recommended:  Recommendations accepted by provider  Results for orders placed or performed during the hospital encounter of 11/05/21  Blood Culture ID Panel (Reflexed) (Collected: 11/05/2021  2:19 PM)  Result Value Ref Range   Enterococcus faecalis NOT DETECTED NOT DETECTED   Enterococcus Faecium NOT DETECTED NOT DETECTED   Listeria monocytogenes NOT DETECTED NOT DETECTED   Staphylococcus species NOT DETECTED NOT DETECTED   Staphylococcus aureus (BCID) NOT DETECTED NOT DETECTED   Staphylococcus epidermidis NOT DETECTED NOT DETECTED   Staphylococcus lugdunensis NOT DETECTED NOT DETECTED   Streptococcus species NOT DETECTED NOT DETECTED   Streptococcus agalactiae NOT DETECTED NOT DETECTED   Streptococcus pneumoniae NOT DETECTED NOT DETECTED   Streptococcus pyogenes NOT DETECTED NOT DETECTED   A.calcoaceticus-baumannii NOT DETECTED NOT DETECTED   Bacteroides fragilis NOT DETECTED NOT DETECTED   Enterobacterales DETECTED (A) NOT DETECTED   Enterobacter cloacae complex NOT DETECTED NOT DETECTED   Escherichia coli DETECTED (A) NOT DETECTED   Klebsiella aerogenes NOT DETECTED NOT DETECTED   Klebsiella oxytoca NOT DETECTED NOT DETECTED   Klebsiella pneumoniae NOT DETECTED NOT DETECTED   Proteus species NOT DETECTED NOT DETECTED   Salmonella species NOT DETECTED NOT DETECTED   Serratia marcescens NOT DETECTED NOT DETECTED   Haemophilus influenzae NOT DETECTED NOT DETECTED   Neisseria meningitidis NOT DETECTED NOT DETECTED   Pseudomonas aeruginosa  NOT DETECTED NOT DETECTED   Stenotrophomonas maltophilia NOT DETECTED NOT DETECTED   Candida albicans NOT DETECTED NOT DETECTED   Candida auris NOT DETECTED NOT DETECTED   Candida glabrata NOT DETECTED NOT DETECTED   Candida krusei NOT DETECTED NOT DETECTED   Candida parapsilosis NOT DETECTED NOT DETECTED   Candida tropicalis NOT DETECTED NOT DETECTED   Cryptococcus neoformans/gattii NOT DETECTED NOT DETECTED   CTX-M ESBL NOT DETECTED NOT DETECTED   Carbapenem resistance IMP NOT DETECTED NOT DETECTED   Carbapenem resistance KPC NOT DETECTED NOT DETECTED   Carbapenem resistance NDM NOT DETECTED NOT DETECTED   Carbapenem resist OXA 48 LIKE NOT DETECTED NOT DETECTED   Carbapenem resistance VIM NOT DETECTED NOT DETECTED    Zoeie Ritter 11/06/2021  10:28 AM

## 2021-11-06 NOTE — Plan of Care (Signed)
  Problem: Education: Goal: Knowledge of General Education information will improve Description Including pain rating scale, medication(s)/side effects and non-pharmacologic comfort measures Outcome: Progressing   Problem: Health Behavior/Discharge Planning: Goal: Ability to manage health-related needs will improve Outcome: Progressing   

## 2021-11-07 DIAGNOSIS — N12 Tubulo-interstitial nephritis, not specified as acute or chronic: Secondary | ICD-10-CM | POA: Diagnosis not present

## 2021-11-07 LAB — BASIC METABOLIC PANEL
Anion gap: 6 (ref 5–15)
BUN: 13 mg/dL (ref 6–20)
CO2: 28 mmol/L (ref 22–32)
Calcium: 8.5 mg/dL — ABNORMAL LOW (ref 8.9–10.3)
Chloride: 103 mmol/L (ref 98–111)
Creatinine, Ser: 0.77 mg/dL (ref 0.44–1.00)
GFR, Estimated: >60 mL/min (ref >60)
Glucose, Bld: 111 mg/dL — ABNORMAL HIGH (ref 70–99)
Potassium: 4.3 mmol/L (ref 3.5–5.1)
Sodium: 137 mmol/L (ref 135–145)

## 2021-11-07 LAB — CBC
HCT: 37.2 % (ref 36.0–46.0)
Hemoglobin: 12.1 g/dL (ref 12.0–15.0)
MCH: 28.1 pg (ref 26.0–34.0)
MCHC: 32.5 g/dL (ref 30.0–36.0)
MCV: 86.5 fL (ref 80.0–100.0)
Platelets: 186 10*3/uL (ref 150–400)
RBC: 4.3 MIL/uL (ref 3.87–5.11)
RDW: 15.5 % (ref 11.5–15.5)
WBC: 7.5 10*3/uL (ref 4.0–10.5)
nRBC: 0 % (ref 0.0–0.2)

## 2021-11-07 LAB — URINE CULTURE: Culture: 60000 — AB

## 2021-11-07 LAB — MAGNESIUM: Magnesium: 2.1 mg/dL (ref 1.7–2.4)

## 2021-11-07 MED ORDER — CIPROFLOXACIN HCL 500 MG PO TABS: 500.0000 mg | ORAL_TABLET | Freq: Two times a day (BID) | ORAL | 0 refills | Status: AC

## 2021-11-07 MED ORDER — SODIUM CHLORIDE 0.9 % IV SOLN: 2.0000 g | INTRAVENOUS | Status: DC

## 2021-11-07 NOTE — Discharge Summary (Signed)
Physician Discharge Summary  CHIFFON KITTLESON OXB:[id number] DOB: 03-10-1971 DOA: 11/05/2021  PCP: Emelia Loron, NP  Admit date: 11/05/2021  Discharge date: 11/07/2021  Admitted From:Home  Disposition:  Home  Recommendations for Outpatient Follow-up:  Follow up with PCP in 1-2 weeks Continue ciprofloxacin 500 mg twice daily as prescribed for 9 more days to complete total 10-day course of treatment for E. coli bacteremia secondary to pyelonephritis Continue other home medications as prior  Home Health: None  Equipment/Devices: None  Discharge Condition:Stable  CODE STATUS: Full  Diet recommendation: Heart Healthy  Brief/Interim Summary: FRANCES AMBROSINO is a 51 y.o. 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  She was admitted for acute bilateral pyelonephritis and started on IV Rocephin empirically.  She was then noted to have E. coli bacteremia as well as E. coli UTI with sensitivities returning on day of discharge.  She is sensitive to fluoroquinolone and has been transitioned to this for 9 more days to complete total 10-day course of treatment.  No other acute events noted throughout the course of this admission and she is stable for discharge.  Discharge Diagnoses:  Principal Problem:   Pyelonephritis Active Problems:   Opioid use disorder, severe, dependence ([place])   IVDA (intravenous drug abuse) complicating pregnancy ([place])  Principal discharge diagnosis: Acute bilateral pyelonephritis with E. coli UTI and associated bacteremia.  Discharge Instructions  Discharge Instructions     Diet - low sodium heart healthy   Complete by: As directed    Increase activity slowly   Complete by: As directed       Allergies as of 11/07/2021       Reactions   Mango Flavor  Hives, Swelling   Pyridium [phenazopyridine Hcl] Other (See Comments)   Irritation to skin        Medication List     STOP taking these medications    amoxicillin 500 MG tablet Commonly known as: AMOXIL       TAKE these medications    acetaminophen 500 MG tablet Commonly known as: TYLENOL Take 1,000 mg by mouth 3 (three) times daily as needed for moderate pain.   albuterol 108 (90 Base) MCG/ACT inhaler Commonly known as: VENTOLIN HFA Inhale 2 puffs into the lungs every 4 (four) hours as needed for wheezing or shortness of breath.   ciprofloxacin 500 MG tablet Commonly known as: Cipro Take 1 tablet (500 mg total) by mouth 2 (two) times daily for 9 days.   ibuprofen 200 MG tablet Commonly known as: ADVIL Take 400 mg by mouth 2 (two) times daily as needed for moderate pain.   Mounjaro 5 MG/0.5ML Pen Generic drug: tirzepatide Inject 5 mg into the skin once a week.   Suboxone 8-2 MG Film Generic drug: Buprenorphine HCl-Naloxone HCl Place 1 Film under the tongue 2 (two) times a day.   Vitamin D (Ergocalciferol) 1.25 MG (50000 UNIT) Caps capsule Commonly known as: DRISDOL Take 50,000 Units by mouth once a week.   Wixela Inhub 250-50 MCG/ACT Aepb Generic drug: fluticasone-salmeterol Inhale 1 puff into the lungs every 12 (twelve) hours.        Follow-up Information     Emelia Loron, NP. Schedule an appointment as soon as possible for a visit in 1 week(s).   Specialty: Nurse Practitioner Contact information:  [address] [place] [address] [phone]                Allergies  Allergen Reactions   Mango Flavor Hives and Swelling   Pyridium [Phenazopyridine Hcl] Other (See Comments)    Irritation to skin    Consultations: None   Procedures/Studies: CT Renal Stone Study  Result Date: 11/05/2021 CLINICAL DATA:  Right flank pain, chills EXAM: CT ABDOMEN AND PELVIS WITHOUT CONTRAST TECHNIQUE: Multidetector CT imaging of the abdomen  and pelvis was performed following the standard protocol without IV contrast. RADIATION DOSE REDUCTION: This exam was performed according to the departmental dose-optimization program which includes automated exposure control, adjustment of the mA and/or kV according to patient size and/or use of iterative reconstruction technique. COMPARISON:  11/06/2018 FINDINGS: Lower chest: Unremarkable. Hepatobiliary: No focal abnormalities are seen in the liver. Gallbladder is not distended. There is no dilation of bile ducts. Pancreas: No focal abnormalities are seen. Spleen: Unremarkable. Adrenals/Urinary Tract: Adrenals are unremarkable. There is minimal prominence of the pelvocaliceal system in the right kidney. There is mild perinephric stranding around the kidneys, more so on the right side. There is mild wall thickening in right renal pelvis and proximal right ureter. In thin 3 mm axial images, there is a possible 1 mm calculus in the lower pole of right kidney. There is no definite demonstrable calcific density in the ureters. There are a few small round calcific densities along the course of right ureter which have not changed significantly suggesting possible vascular calcifications. Urinary bladder is unremarkable. Stomach/Bowel: Stomach is unremarkable. Small bowel loops are not dilated. Appendix is not dilated. There is no wall thickening and colon. There is no pericolic stranding. Vascular/Lymphatic: Scattered arterial calcifications are seen. Reproductive: Unremarkable. Other: There is no ascites or pneumoperitoneum. Umbilical and paraumbilical hernias containing fat are noted. Musculoskeletal: No acute findings are seen. IMPRESSION: There is perinephric stranding around the kidneys, more so on the right side. Pelvocaliceal system in the right kidney is slightly more prominent in comparison to the left side. There is mild thickening in the wall of the right renal pelvis and proximal right ureter. Findings  suggest possible acute pyelonephritis. Please correlate with laboratory findings. Another possibility would be recent passage of right ureteral calculus. There are no definite demonstrable opaque ureteral stones.There are no demonstrable opaque stones in the urinary bladder. There is possible 1 mm right renal calculus. There is no evidence of intestinal obstruction or pneumoperitoneum. Appendix is not dilated. Other findings as described in the body of the report. Electronically Signed   By: Elmer Picker M.D.   On: 11/05/2021 14:23     Discharge Exam: Vitals:   11/06/21 2103 11/07/21 0516  BP: (!) 116/93 109/66  Pulse: 79 74  Resp: 19 19  Temp: 99.9 F (37.7 C) 98.5 F (36.9 C)  SpO2: 97% 93%   Vitals:   11/06/21 0539 11/06/21 1342 11/06/21 2103 11/07/21 0516  BP:  (!) 160/70 (!) 116/93 109/66  Pulse:  87 79 74  Resp:  '18 19 19  '$ Temp: 100 F (37.8 C) 98.9 F (37.2 C) 99.9 F (37.7 C) 98.5 F (36.9 C)  TempSrc: Oral Oral    SpO2:  97% 97% 93%  Weight:      Height:        General: Pt is alert, awake, not in acute distress, obese Cardiovascular: RRR, S1/S2 +, no rubs, no gallops Respiratory: CTA bilaterally, no wheezing, no rhonchi Abdominal: Soft, NT, ND, bowel sounds +  Extremities: no edema, no cyanosis    The results of significant diagnostics from this hospitalization (including imaging, microbiology, ancillary and laboratory) are listed below for reference.     Microbiology: Recent Results (from the past 240 hour(s))  Urine Culture     Status: Abnormal   Collection Time: 11/05/21 12:09 PM   Specimen: Urine, Clean Catch  Result Value Ref Range Status   Specimen Description   Final    URINE, CLEAN CATCH Performed at [facility], [address], [place], [place] [address]    Special Requests   Final    NONE Performed at [facility], [address], [place], [place] [address]    Culture 60,000 COLONIES/mL ESCHERICHIA COLI (A)  Final   Report Status  11/07/2021 FINAL  Final   Organism ID, Bacteria ESCHERICHIA COLI (A)  Final      Susceptibility   Escherichia coli - MIC*    AMPICILLIN >=32 RESISTANT Resistant     CEFAZOLIN 8 SENSITIVE Sensitive     CEFEPIME <=0.12 SENSITIVE Sensitive     CEFTRIAXONE <=0.25 SENSITIVE Sensitive     CIPROFLOXACIN <=0.25 SENSITIVE Sensitive     GENTAMICIN <=1 SENSITIVE Sensitive     IMIPENEM <=0.25 SENSITIVE Sensitive     NITROFURANTOIN <=16 SENSITIVE Sensitive     TRIMETH/SULFA >=320 RESISTANT Resistant     AMPICILLIN/SULBACTAM >=32 RESISTANT Resistant     PIP/TAZO >=128 RESISTANT Resistant     * 60,000 COLONIES/mL ESCHERICHIA COLI  Culture, blood (single)     Status: Abnormal (Preliminary result)   Collection Time: 11/05/21  2:19 PM   Specimen: BLOOD  Result Value Ref Range Status   Specimen Description   Final    BLOOD BLOOD RIGHT FOREARM Performed at [facility], [address], [place], [place] [address]    Special Requests   Final    BOTTLES DRAWN AEROBIC AND ANAEROBIC Blood Culture adequate volume Performed at [place] [place], [place] [address]    Culture  Setup Time   Final    GRAM NEGATIVE RODS IN BOTH AEROBIC AND ANAEROBIC BOTTLES ESCHERICHIA COLI CRITICAL RESULT CALLED TO, READ BACK BY AND VERIFIED WITH: PHARMD [place] [id number] BY EC    Culture (A)  Final    ESCHERICHIA COLI SUSCEPTIBILITIES TO FOLLOW Performed at [facility], [place] [address], [place], [place] [address]    Report Status PENDING  Incomplete  Blood Culture ID Panel (Reflexed)     Status: Abnormal   Collection Time: 11/05/21  2:19 PM  Result Value Ref Range Status   Enterococcus faecalis NOT DETECTED NOT DETECTED Final   Enterococcus Faecium NOT DETECTED NOT DETECTED Final   Listeria monocytogenes NOT DETECTED NOT DETECTED Final   Staphylococcus species NOT DETECTED NOT DETECTED Final   Staphylococcus aureus (BCID) NOT DETECTED NOT DETECTED Final   Staphylococcus epidermidis  NOT DETECTED NOT DETECTED Final   Staphylococcus lugdunensis NOT DETECTED NOT DETECTED Final   Streptococcus species NOT DETECTED NOT DETECTED Final   Streptococcus agalactiae NOT DETECTED NOT DETECTED Final   Streptococcus pneumoniae NOT DETECTED NOT DETECTED Final   Streptococcus pyogenes NOT DETECTED NOT DETECTED Final   A.calcoaceticus-baumannii NOT DETECTED NOT DETECTED Final   Bacteroides fragilis NOT DETECTED NOT DETECTED Final   Enterobacterales DETECTED (A) NOT DETECTED Final    Comment: Enterobacterales represent a large order of gram negative bacteria, not a single organism. CRITICAL RESULT CALLED TO, READ BACK BY AND VERIFIED WITH: [place] [id number] AM BY  EC    Enterobacter cloacae complex NOT DETECTED NOT DETECTED Final   Escherichia coli DETECTED (A) NOT DETECTED Final    Comment: CRITICAL RESULT CALLED TO, READ BACK BY AND VERIFIED WITH: [place] [id number] AM BY EC    Klebsiella aerogenes NOT DETECTED NOT DETECTED Final   Klebsiella oxytoca NOT DETECTED NOT DETECTED Final   Klebsiella pneumoniae NOT DETECTED NOT DETECTED Final   Proteus species NOT DETECTED NOT DETECTED Final   Salmonella species NOT DETECTED NOT DETECTED Final   Serratia marcescens NOT DETECTED NOT DETECTED Final   Haemophilus influenzae NOT DETECTED NOT DETECTED Final   Neisseria meningitidis NOT DETECTED NOT DETECTED Final   Pseudomonas aeruginosa NOT DETECTED NOT DETECTED Final   Stenotrophomonas maltophilia NOT DETECTED NOT DETECTED Final   Candida albicans NOT DETECTED NOT DETECTED Final   Candida auris NOT DETECTED NOT DETECTED Final   Candida glabrata NOT DETECTED NOT DETECTED Final   Candida krusei NOT DETECTED NOT DETECTED Final   Candida parapsilosis NOT DETECTED NOT DETECTED Final   Candida tropicalis NOT DETECTED NOT DETECTED Final   Cryptococcus neoformans/gattii NOT DETECTED NOT DETECTED Final   CTX-M ESBL NOT DETECTED NOT DETECTED Final   Carbapenem resistance IMP NOT  DETECTED NOT DETECTED Final   Carbapenem resistance KPC NOT DETECTED NOT DETECTED Final   Carbapenem resistance NDM NOT DETECTED NOT DETECTED Final   Carbapenem resist OXA 48 LIKE NOT DETECTED NOT DETECTED Final   Carbapenem resistance VIM NOT DETECTED NOT DETECTED Final    Comment: Performed at [facility], [address] [place], [place] [address]  Culture, blood (single)     Status: None (Preliminary result)   Collection Time: 11/05/21  2:58 PM   Specimen: BLOOD  Result Value Ref Range Status   Specimen Description BLOOD BLOOD RIGHT ARM  Final   Special Requests   Final    BOTTLES DRAWN AEROBIC AND ANAEROBIC Blood Culture adequate volume   Culture   Final    NO GROWTH 2 DAYS Performed at [facility], [address], [place], [place] [address]    Report Status PENDING  Incomplete     Labs: BNP (last 3 results) No results for input(s): "BNP" in the last 8760 hours. Basic Metabolic Panel: Recent Labs  Lab 11/05/21 1243 11/05/21 1544 11/06/21 0539 11/07/21 0615  NA 133*  --  133* 137  K 4.5  --  4.0 4.3  CL 101  --  102 103  CO2 24  --  24 28  GLUCOSE 108*  --  159* 111*  BUN 13  --  11 13  CREATININE 0.74 0.76 0.77 0.77  CALCIUM 8.6*  --  8.5* 8.5*  MG  --   --  2.0 2.1   Liver Function Tests: Recent Labs  Lab 11/05/21 1243  AST 29  ALT 44  ALKPHOS 54  BILITOT 0.8  PROT 7.8  ALBUMIN 3.6   Recent Labs  Lab 11/05/21 1243  LIPASE 30   No results for input(s): "AMMONIA" in the last 168 hours. CBC: Recent Labs  Lab 11/05/21 1243 11/05/21 1544 11/06/21 0539 11/07/21 0615  WBC 12.8* 14.6* 12.6* 7.5  NEUTROABS 8.8*  --   --   --   HGB 12.9 13.1 12.3 12.1  HCT 39.7 39.4 37.5 37.2  MCV 86.9 85.7 87.0 86.5  PLT 194 221 188 186   Cardiac Enzymes: No results for input(s): "CKTOTAL", "CKMB", "CKMBINDEX", "TROPONINI" in the last 168 hours. BNP: Invalid input(s): "POCBNP"  CBG: No results for input(s): "GLUCAP" in the last 168 hours. D-Dimer No  results for input(s): "DDIMER" in the last 72 hours. Hgb A1c No results for input(s): "HGBA1C" in the last 72 hours. Lipid Profile No results for input(s): "CHOL", "HDL", "LDLCALC", "TRIG", "CHOLHDL", "LDLDIRECT" in the last 72 hours. Thyroid function studies No results for input(s): "TSH", "T4TOTAL", "T3FREE", "THYROIDAB" in the last 72 hours.  Invalid input(s): "FREET3" Anemia work up No results for input(s): "VITAMINB12", "FOLATE", "FERRITIN", "TIBC", "IRON", "RETICCTPCT" in the last 72 hours. Urinalysis    Component Value Date/Time   COLORURINE YELLOW 11/05/2021 1209   APPEARANCEUR HAZY (A) 11/05/2021 1209   LABSPEC 1.008 11/05/2021 1209   PHURINE 6.0 11/05/2021 1209   GLUCOSEU NEGATIVE 11/05/2021 1209   HGBUR MODERATE (A) 11/05/2021 1209   BILIRUBINUR NEGATIVE 11/05/2021 1209   KETONESUR NEGATIVE 11/05/2021 1209   PROTEINUR NEGATIVE 11/05/2021 1209   UROBILINOGEN 0.2 10/18/2014 0100   NITRITE NEGATIVE 11/05/2021 1209   LEUKOCYTESUR LARGE (A) 11/05/2021 1209   Sepsis Labs Recent Labs  Lab 11/05/21 1243 11/05/21 1544 11/06/21 0539 11/07/21 0615  WBC 12.8* 14.6* 12.6* 7.5   Microbiology Recent Results (from the past 240 hour(s))  Urine Culture     Status: Abnormal   Collection Time: 11/05/21 12:09 PM   Specimen: Urine, Clean Catch  Result Value Ref Range Status   Specimen Description   Final    URINE, CLEAN CATCH Performed at [facility], [address], [place], [place] [address]    Special Requests   Final    NONE Performed at [facility], [address], [place], [place] [address]    Culture 60,000 COLONIES/mL ESCHERICHIA COLI (A)  Final   Report Status 11/07/2021 FINAL  Final   Organism ID, Bacteria ESCHERICHIA COLI (A)  Final      Susceptibility   Escherichia coli - MIC*    AMPICILLIN >=32 RESISTANT Resistant     CEFAZOLIN 8 SENSITIVE Sensitive     CEFEPIME <=0.12 SENSITIVE Sensitive     CEFTRIAXONE <=0.25 SENSITIVE Sensitive     CIPROFLOXACIN <=0.25  SENSITIVE Sensitive     GENTAMICIN <=1 SENSITIVE Sensitive     IMIPENEM <=0.25 SENSITIVE Sensitive     NITROFURANTOIN <=16 SENSITIVE Sensitive     TRIMETH/SULFA >=320 RESISTANT Resistant     AMPICILLIN/SULBACTAM >=32 RESISTANT Resistant     PIP/TAZO >=128 RESISTANT Resistant     * 60,000 COLONIES/mL ESCHERICHIA COLI  Culture, blood (single)     Status: Abnormal (Preliminary result)   Collection Time: 11/05/21  2:19 PM   Specimen: BLOOD  Result Value Ref Range Status   Specimen Description   Final    BLOOD BLOOD RIGHT FOREARM Performed at [facility], [address], [place], [place] [address]    Special Requests   Final    BOTTLES DRAWN AEROBIC AND ANAEROBIC Blood Culture adequate volume Performed at [facility], [address], [place], [place] [address]    Culture  Setup Time   Final    GRAM NEGATIVE RODS IN BOTH AEROBIC AND ANAEROBIC BOTTLES ESCHERICHIA COLI CRITICAL RESULT CALLED TO, READ BACK BY AND VERIFIED WITH: PHARMD [place] [id number] BY EC    Culture (A)  Final    ESCHERICHIA COLI SUSCEPTIBILITIES TO FOLLOW Performed at [facility], [place] [address] [place], [place] [address]    Report Status PENDING  Incomplete  Blood Culture ID Panel (Reflexed)     Status: Abnormal   Collection Time: 11/05/21  2:19 PM  Result Value Ref Range Status   Enterococcus faecalis NOT DETECTED NOT DETECTED Final   Enterococcus Faecium NOT DETECTED NOT DETECTED Final   Listeria monocytogenes NOT DETECTED NOT DETECTED Final   Staphylococcus species NOT DETECTED NOT DETECTED Final   Staphylococcus aureus (BCID) NOT DETECTED NOT DETECTED Final   Staphylococcus epidermidis NOT DETECTED NOT DETECTED Final   Staphylococcus lugdunensis NOT DETECTED NOT DETECTED Final   Streptococcus species NOT DETECTED NOT DETECTED Final   Streptococcus agalactiae NOT DETECTED NOT DETECTED Final   Streptococcus pneumoniae NOT DETECTED NOT DETECTED Final   Streptococcus pyogenes NOT DETECTED NOT  DETECTED Final   A.calcoaceticus-baumannii NOT DETECTED NOT DETECTED Final   Bacteroides fragilis NOT DETECTED NOT DETECTED Final   Enterobacterales DETECTED (A) NOT DETECTED Final    Comment: Enterobacterales represent a large order of gram negative bacteria, not a single organism. CRITICAL RESULT CALLED TO, READ BACK BY AND VERIFIED WITH: [place] [phone] AM BY EC    Enterobacter cloacae complex NOT DETECTED NOT DETECTED Final   Escherichia coli DETECTED (A) NOT DETECTED Final    Comment: CRITICAL RESULT CALLED TO, READ BACK BY AND VERIFIED WITH: [place] [phone] AM BY EC    Klebsiella aerogenes NOT DETECTED NOT DETECTED Final   Klebsiella oxytoca NOT DETECTED NOT DETECTED Final   Klebsiella pneumoniae NOT DETECTED NOT DETECTED Final   Proteus species NOT DETECTED NOT DETECTED Final   Salmonella species NOT DETECTED NOT DETECTED Final   Serratia marcescens NOT DETECTED NOT DETECTED Final   Haemophilus influenzae NOT DETECTED NOT DETECTED Final   Neisseria meningitidis NOT DETECTED NOT DETECTED Final   Pseudomonas aeruginosa NOT DETECTED NOT DETECTED Final   Stenotrophomonas maltophilia NOT DETECTED NOT DETECTED Final   Candida albicans NOT DETECTED NOT DETECTED Final   Candida auris NOT DETECTED NOT DETECTED Final   Candida glabrata NOT DETECTED NOT DETECTED Final   Candida krusei NOT DETECTED NOT DETECTED Final   Candida parapsilosis NOT DETECTED NOT DETECTED Final   Candida tropicalis NOT DETECTED NOT DETECTED Final   Cryptococcus neoformans/gattii NOT DETECTED NOT DETECTED Final   CTX-M ESBL NOT DETECTED NOT DETECTED Final   Carbapenem resistance IMP NOT DETECTED NOT DETECTED Final   Carbapenem resistance KPC NOT DETECTED NOT DETECTED Final   Carbapenem resistance NDM NOT DETECTED NOT DETECTED Final   Carbapenem resist OXA 48 LIKE NOT DETECTED NOT DETECTED Final   Carbapenem resistance VIM NOT DETECTED NOT DETECTED Final    Comment: Performed at [facility], [address] [place], [place] [address]  Culture, blood (single)     Status: None (Preliminary result)   Collection Time: 11/05/21  2:58 PM   Specimen: BLOOD  Result Value Ref Range Status   Specimen Description BLOOD BLOOD RIGHT ARM  Final   Special Requests   Final    BOTTLES DRAWN AEROBIC AND ANAEROBIC Blood Culture adequate volume   Culture   Final    NO GROWTH 2 DAYS Performed at [facility], [address] [place], [place] [address]    Report Status PENDING  Incomplete     Time coordinating discharge: 35 minutes  SIGNED:   Rodena Goldmann, DO [facility] 11/07/2021, 11:44 AM  If 7PM-7AM, please contact night-coverage [email or web address]

## 2021-11-08 LAB — CULTURE, BLOOD (SINGLE): Special Requests: ADEQUATE

## 2021-11-10 LAB — CULTURE, BLOOD (SINGLE)
Culture: NO GROWTH
Special Requests: ADEQUATE
